# Patient Record
Sex: Male | Born: 1945 | Race: White | Hispanic: No | Marital: Married | Smoking: Never smoker
Health system: Southern US, Community
[De-identification: ages and names within clinical notes are randomized; demographics above are authoritative.]

## PROBLEM LIST (undated history)

## (undated) DIAGNOSIS — I219 Acute myocardial infarction, unspecified: Secondary | ICD-10-CM

## (undated) DIAGNOSIS — N184 Chronic kidney disease, stage 4 (severe): Secondary | ICD-10-CM

## (undated) DIAGNOSIS — I6529 Occlusion and stenosis of unspecified carotid artery: Secondary | ICD-10-CM

## (undated) DIAGNOSIS — I340 Nonrheumatic mitral (valve) insufficiency: Secondary | ICD-10-CM

## (undated) DIAGNOSIS — IMO0002 Reserved for concepts with insufficient information to code with codable children: Secondary | ICD-10-CM

## (undated) DIAGNOSIS — I2510 Atherosclerotic heart disease of native coronary artery without angina pectoris: Secondary | ICD-10-CM

## (undated) DIAGNOSIS — I472 Ventricular tachycardia: Secondary | ICD-10-CM

## (undated) DIAGNOSIS — I442 Atrioventricular block, complete: Secondary | ICD-10-CM

## (undated) DIAGNOSIS — C4490 Unspecified malignant neoplasm of skin, unspecified: Secondary | ICD-10-CM

## (undated) DIAGNOSIS — Z9289 Personal history of other medical treatment: Secondary | ICD-10-CM

## (undated) DIAGNOSIS — N189 Chronic kidney disease, unspecified: Secondary | ICD-10-CM

## (undated) DIAGNOSIS — Z950 Presence of cardiac pacemaker: Secondary | ICD-10-CM

## (undated) DIAGNOSIS — M329 Systemic lupus erythematosus, unspecified: Secondary | ICD-10-CM

## (undated) DIAGNOSIS — I10 Essential (primary) hypertension: Secondary | ICD-10-CM

## (undated) DIAGNOSIS — E785 Hyperlipidemia, unspecified: Secondary | ICD-10-CM

## (undated) DIAGNOSIS — M1990 Unspecified osteoarthritis, unspecified site: Secondary | ICD-10-CM

## (undated) DIAGNOSIS — N183 Chronic kidney disease, stage 3 (moderate): Secondary | ICD-10-CM

## (undated) DIAGNOSIS — I208 Other forms of angina pectoris: Secondary | ICD-10-CM

## (undated) DIAGNOSIS — I459 Conduction disorder, unspecified: Secondary | ICD-10-CM

## (undated) HISTORY — DX: Chronic kidney disease, unspecified: N18.9

## (undated) HISTORY — DX: Atherosclerotic heart disease of native coronary artery without angina pectoris: I25.10

## (undated) HISTORY — DX: Hyperlipidemia, unspecified: E78.5

## (undated) HISTORY — DX: Atrioventricular block, complete: I44.2

## (undated) HISTORY — DX: Nonrheumatic mitral (valve) insufficiency: I34.0

## (undated) HISTORY — DX: Occlusion and stenosis of unspecified carotid artery: I65.29

## (undated) HISTORY — PX: ACHILLES TENDON REPAIR: SUR1153

## (undated) HISTORY — DX: Personal history of other medical treatment: Z92.89

## (undated) HISTORY — PX: CARDIAC CATHETERIZATION: SHX172

## (undated) HISTORY — PX: MOHS SURGERY: SUR867

## (undated) HISTORY — DX: Essential (primary) hypertension: I10

## (undated) HISTORY — DX: Conduction disorder, unspecified: I45.9

## (undated) HISTORY — DX: Ventricular tachycardia: I47.2

## (undated) HISTORY — PX: CATARACT EXTRACTION W/ INTRAOCULAR LENS IMPLANT: SHX1309

## (undated) HISTORY — DX: Chronic kidney disease, stage 4 (severe): N18.4

## (undated) HISTORY — PX: CORONARY ARTERY BYPASS GRAFT: SHX141

---
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
## 1999-08-21 ENCOUNTER — Ambulatory Visit (HOSPITAL_COMMUNITY): Admission: RE | Admit: 1999-08-21 | Discharge: 1999-08-21 | Payer: Self-pay | Admitting: Gastroenterology

## 1999-08-21 ENCOUNTER — Encounter (INDEPENDENT_AMBULATORY_CARE_PROVIDER_SITE_OTHER): Payer: Self-pay | Admitting: Specialist

## 2000-04-28 HISTORY — PX: CAROTID ENDARTERECTOMY: SUR193

## 2000-05-15 ENCOUNTER — Emergency Department (HOSPITAL_COMMUNITY): Admission: EM | Admit: 2000-05-15 | Discharge: 2000-05-15 | Payer: Self-pay | Admitting: Emergency Medicine

## 2000-08-17 ENCOUNTER — Ambulatory Visit (HOSPITAL_COMMUNITY): Admission: RE | Admit: 2000-08-17 | Discharge: 2000-08-17 | Payer: Self-pay | Admitting: Cardiology

## 2000-12-17 ENCOUNTER — Encounter: Payer: Self-pay | Admitting: *Deleted

## 2000-12-17 ENCOUNTER — Ambulatory Visit (HOSPITAL_COMMUNITY): Admission: RE | Admit: 2000-12-17 | Discharge: 2000-12-17 | Payer: Self-pay | Admitting: *Deleted

## 2000-12-29 ENCOUNTER — Encounter: Payer: Self-pay | Admitting: *Deleted

## 2000-12-31 ENCOUNTER — Inpatient Hospital Stay (HOSPITAL_COMMUNITY): Admission: RE | Admit: 2000-12-31 | Discharge: 2001-01-01 | Payer: Self-pay | Admitting: *Deleted

## 2000-12-31 ENCOUNTER — Encounter (INDEPENDENT_AMBULATORY_CARE_PROVIDER_SITE_OTHER): Payer: Self-pay | Admitting: *Deleted

## 2001-12-14 ENCOUNTER — Encounter (INDEPENDENT_AMBULATORY_CARE_PROVIDER_SITE_OTHER): Payer: Self-pay | Admitting: *Deleted

## 2001-12-14 ENCOUNTER — Ambulatory Visit (HOSPITAL_COMMUNITY): Admission: RE | Admit: 2001-12-14 | Discharge: 2001-12-14 | Payer: Self-pay | Admitting: Gastroenterology

## 2005-04-28 HISTORY — PX: PACEMAKER INSERTION: SHX728

## 2005-12-26 ENCOUNTER — Inpatient Hospital Stay (HOSPITAL_COMMUNITY): Admission: RE | Admit: 2005-12-26 | Discharge: 2005-12-27 | Payer: Self-pay | Admitting: Cardiology

## 2007-03-12 ENCOUNTER — Encounter: Admission: RE | Admit: 2007-03-12 | Discharge: 2007-03-12 | Payer: Self-pay | Admitting: Internal Medicine

## 2007-05-14 ENCOUNTER — Ambulatory Visit: Payer: Self-pay | Admitting: Vascular Surgery

## 2008-03-01 ENCOUNTER — Inpatient Hospital Stay (HOSPITAL_BASED_OUTPATIENT_CLINIC_OR_DEPARTMENT_OTHER): Admission: RE | Admit: 2008-03-01 | Discharge: 2008-03-01 | Payer: Self-pay | Admitting: Cardiology

## 2008-03-08 ENCOUNTER — Ambulatory Visit (HOSPITAL_COMMUNITY): Admission: RE | Admit: 2008-03-08 | Discharge: 2008-03-09 | Payer: Self-pay | Admitting: Cardiology

## 2008-05-15 ENCOUNTER — Ambulatory Visit: Payer: Self-pay | Admitting: Vascular Surgery

## 2009-03-01 DIAGNOSIS — M109 Gout, unspecified: Secondary | ICD-10-CM | POA: Insufficient documentation

## 2009-03-01 DIAGNOSIS — E039 Hypothyroidism, unspecified: Secondary | ICD-10-CM | POA: Insufficient documentation

## 2009-03-01 DIAGNOSIS — M545 Low back pain, unspecified: Secondary | ICD-10-CM | POA: Insufficient documentation

## 2009-03-01 DIAGNOSIS — N529 Male erectile dysfunction, unspecified: Secondary | ICD-10-CM | POA: Insufficient documentation

## 2009-03-01 DIAGNOSIS — M1990 Unspecified osteoarthritis, unspecified site: Secondary | ICD-10-CM | POA: Insufficient documentation

## 2009-05-21 ENCOUNTER — Ambulatory Visit: Payer: Self-pay | Admitting: Ophthalmology

## 2009-05-21 ENCOUNTER — Ambulatory Visit: Payer: Self-pay | Admitting: Cardiovascular Disease

## 2009-06-15 ENCOUNTER — Ambulatory Visit: Payer: Self-pay | Admitting: Vascular Surgery

## 2009-06-27 ENCOUNTER — Ambulatory Visit: Payer: Self-pay | Admitting: Ophthalmology

## 2010-03-01 ENCOUNTER — Encounter: Payer: Self-pay | Admitting: Internal Medicine

## 2010-03-05 ENCOUNTER — Ambulatory Visit: Payer: Self-pay | Admitting: Internal Medicine

## 2010-03-09 ENCOUNTER — Encounter: Payer: Self-pay | Admitting: Internal Medicine

## 2010-03-29 ENCOUNTER — Encounter (INDEPENDENT_AMBULATORY_CARE_PROVIDER_SITE_OTHER): Payer: Self-pay | Admitting: *Deleted

## 2010-04-19 ENCOUNTER — Telehealth: Payer: Self-pay | Admitting: Internal Medicine

## 2010-04-25 ENCOUNTER — Ambulatory Visit: Payer: Self-pay | Admitting: Cardiology

## 2010-05-20 ENCOUNTER — Ambulatory Visit
Admission: RE | Admit: 2010-05-20 | Discharge: 2010-05-20 | Payer: Self-pay | Source: Home / Self Care | Attending: Internal Medicine | Admitting: Internal Medicine

## 2010-05-20 ENCOUNTER — Encounter: Payer: Self-pay | Admitting: Internal Medicine

## 2010-05-20 DIAGNOSIS — Z950 Presence of cardiac pacemaker: Secondary | ICD-10-CM | POA: Insufficient documentation

## 2010-05-20 DIAGNOSIS — Z951 Presence of aortocoronary bypass graft: Secondary | ICD-10-CM | POA: Insufficient documentation

## 2010-05-28 NOTE — Letter (Signed)
Summary: Remote Device Check  [place], [place]  [id number] [place]   [place], [place] [address]   Phone: [phone]  Fax: [phone]     March 29, 2010 MRN: [id number]   [place], [place]  [address]   Dear Mr. Hollen,   Your remote transmission was recieved and reviewed by your physician.  All diagnostics were within normal limits for you.  _____Your next transmission is scheduled for:                       .  Please transmit at any time this day.  If you have a wireless device your transmission will be sent automatically.  __X____Your next office visit is scheduled for: January 20,2012 with Dr. Caryl Comes in [place].     Sincerely,  Alma Friendly, LPN

## 2010-05-28 NOTE — Letter (Signed)
Summary: Device-Delinquent Phone Proofreader, [place]  [address] [place]   [place], [place] [address]   Phone: [phone]  Fax: [phone]     March 01, 2010 MRN: [id number]   Kyle Avila [address], [place]  [address]   Dear [name],  According to our records, you were scheduled for a device phone transmission on  02-25-2010.     We did not receive any results from this check.  If you transmitted on your scheduled day, please call us to help troubleshoot your system.  If you forgot to send your transmission, please send one upon receipt of this letter.  Thank you,   [facility]

## 2010-05-28 NOTE — Miscellaneous (Signed)
Summary: Device preload  Clinical Lists Changes  Observations: Added new observation of PPM INDICATN: CHB (03/09/2010 12:45) Added new observation of MAGNET RTE: BOL 85 ERI 65 (03/09/2010 12:45) Added new observation of PPMLEADSTAT2: active (03/09/2010 12:45) Added new observation of PPMLEADSER2: [id number]  (03/09/2010 12:45) Added new observation of PPMLEADMOD2: 4470  (03/09/2010 12:45) Added new observation of PPMLEADDOI2: 12/26/2000  (03/09/2010 12:45) Added new observation of PPMLEADLOC2: RV  (03/09/2010 12:45) Added new observation of PPMLEADSTAT1: active  (03/09/2010 12:45) Added new observation of PPMLEADSER1: [id number]  (03/09/2010 12:45) Added new observation of PPMLEADMOD1: 4469  (03/09/2010 12:45) Added new observation of PPMLEADDOI1: 12/26/2005  (03/09/2010 12:45) Added new observation of PPMLEADLOC1: RA  (03/09/2010 12:45) Added new observation of PPM DOI: 12/08/2000  (03/09/2010 12:45) Added new observation of PPM SERL#: [id number]  (03/09/2010 12:45) Added new observation of PPM MODL#: [id number]  (03/09/2010 12:45) Added new observation of PACEMAKERMFG: Medtronic  (03/09/2010 12:45) Added new observation of PPM IMP MD: Dorothyann Peng Tennant,MD  (03/09/2010 12:45) Added new observation of PPM REFER MD: Servando Salina  (03/09/2010 12:45) Added new observation of PACEMAKER MD: [name], MD  (03/09/2010 12:45)      PPM Specifications Following MD:  [name], MD     Referring MD:  Servando Salina PPM Vendor:  Medtronic     PPM Model Number:  [id number]     PPM Serial Number:  [id number] PPM DOI:  12/08/2000     PPM Implanting MD:  Servando Salina  Lead 1    Location: RA     DOI: 12/26/2005     Model #: [id number]     Serial #: [id number]     Status: active Lead 2    Location: RV     DOI: 12/26/2000     Model #: [id number]     Serial #: [id number]     Status: active  Magnet Response Rate:  BOL 85 ERI 65  Indications:  CHB

## 2010-05-28 NOTE — Cardiovascular Report (Signed)
Summary: Office Visit Remote   Office Visit Remote   Imported By: Sallee Provencal 03/29/2010 15:08:20  _____________________________________________________________________  External Attachment:    Type:   Image     Comment:   External Document

## 2010-05-30 ENCOUNTER — Other Ambulatory Visit (HOSPITAL_COMMUNITY): Payer: Self-pay

## 2010-05-30 NOTE — Assessment & Plan Note (Signed)
Summary: pc2. gd   Visit Type:  pc2  CC:  no complaints.  History of Present Illness: Kyle Avila is seein to establish pacemaker followup.  is a 65 year old gentleman with complex cardiovascular history with bypass surgery in 88 redo bypass in 96 and revascularization with stenting of the circumflex in 2009. At that time he had total LAD and RCA disease his left ventricular function then was okay.  2 years prior that he come in for a stress test was found to be in high-grade heart block. His beta blockers were discontinued a heart block persisted and he underwent dual-chamber pacing.  He has angina with heavy exertion for example last week he was skiing he had to walk up a hill after dinner in the cold. He is otherwise not significantly limited in his exercise tolerance.  He denies syncope or palpitations.    Problems Prior to Update: None  Current Medications (verified): 1)  Aspirin Ec 325 Mg Tbec (Aspirin) .... Take One Tablet By Mouth Daily 2)  Magnebind 400 400-200-1 Mg Tabs (Magnesium-Calcium-Folic Acid) .... Once Daily 3)  Imdur 60 Mg Xr24h-Tab (Isosorbide Mononitrate) .... Once Daily 4)  Nitrostat 0.4 Mg Subl (Nitroglycerin) .Marland Kitchen.. 1 Tablet Under Tongue At Onset of Chest Pain; You May Repeat Every 5 Minutes For Up To 3 Doses. 5)  Metoprolol Succinate 100 Mg Xr24h-Tab (Metoprolol Succinate) .... 1/2 Tab Two Times A Day 6)  Plavix 75 Mg Tabs (Clopidogrel Bisulfate) .... Take One Tablet By Mouth Daily 7)  Losartan Potassium-Hctz 100-12.5 Mg Tabs (Losartan Potassium-Hctz) .... Two Times A Day 8)  Anti-Hist 25 Mg Caps (Diphenhydramine Hcl) .... Once Daily 9)  Colcrys 0.6 Mg Tabs (Colchicine) .... As Needed 10)  Tramadol Hcl 50 Mg Tabs (Tramadol Hcl) .... As Needed 11)  Lipitor 80 Mg Tabs (Atorvastatin Calcium) .... Take 1/2 Tablet By Mouth Daily.  Allergies (verified): No Known Drug Allergies  Past History:  Family History: Last updated: May 26, 2010 Father: died i his 85's  with a heart attack  Social History: Last updated: 05-26-2010 Married  Tobacco Use - No.  Alcohol Use - yes - occasional  Risk Factors: Smoking Status: never (May 26, 2010)  Past Medical History: Mitral Insufficiency CAD status post bypassi  heart block requiring gout Hypertension Hyperlipidemia Chronic renal insufficiencynephrotic  Past Surgical History: CABG and redo Cardiac Catheterization Pacemaker/ dual-chamber, insertion in 2007 Corotid endarterectomy 2002  Review of Systems       full review of systems was negative apart from a history of present illness and past medical history. back stiffness and some gout  Vital Signs:  Patient profile:   65 year old male Height:      69 inches Weight:      170.25 pounds BMI:     25.23 Pulse rate:   60 / minute BP sitting:   150 / 82  (right arm) Cuff size:   regular  Vitals Entered By: Kyle Avila CMA 26-May-2010 2:44 PM)  Physical Exam  General:  Well developed, well nourished,middle 2 older Caucasian male appearing his stated age in no acute distress. Head:  normal HEENT Neck:  supple without thyromegaly or lymphadenopathy endarterectomy scar without bruits Chest Wall:  without kyphosis or scoliosis Lungs:  clear Heart:  regular rate and rhythm without murmurs or gallops Abdomen:  soft with active bowel sounds Msk:  no obvious musculoskeletal defects Extremities:  intact distal pulseswithout clubbing cyanosis or edema Neurologic:  alert and oriented and grossly normal motor and sensory function Skin:  warm and dry Psych:  normal affect   PPM Specifications Following MD:  [name], MD     Referring MD:  Servando Salina PPM Vendor:  Medtronic     PPM Model Number:  [id number]     PPM Serial Number:  [id number] PPM DOI:  12/08/2000     PPM Implanting MD:  Servando Salina  Lead 1    Location: RA     DOI: 12/26/2005     Model #: [id number]     Serial #: [id number]     Status: active Lead 2    Location: RV      DOI: 12/26/2000     Model #: [id number]     Serial #: [id number]     Status: active  Magnet Response Rate:  BOL 85 ERI 65  Indications:  CHB   Impression & Recommendations:  Problem # 1:  PACEMAKER, PERMANENT (ICD-V45.01) Device parameters and data were reviewed and no changes were made  he has evidence of hypertrophic incompetence with less than 2% of his heart beats faster than 100 beats per minute and less than 5% of his tach heart beats faster than 90  Problem # 2:  CAD, ARTERY BYPASS GRAFT (ICD-414.04) he is significant coronary disease. He has some angina with heavy exertion I suspect that this is resulted in the compromise of his heart rate as noted above limiting cardiac output by limiting his heart rate. He is able to do functionally what he wants I suspect that this is the best choice His updated medication list for this problem includes:    Aspirin Ec 325 Mg Tbec (Aspirin) .Marland Kitchen... Take one tablet by mouth daily    Imdur 60 Mg Xr24h-tab (Isosorbide mononitrate) ..... Once daily    Nitrostat 0.4 Mg Subl (Nitroglycerin) .Marland Kitchen... 1 tablet under tongue at onset of chest pain; you may repeat every 5 minutes for up to 3 doses.    Metoprolol Succinate 100 Mg Xr24h-tab (Metoprolol succinate) .Marland Kitchen... 1/2 tab two times a day    Plavix 75 Mg Tabs (Clopidogrel bisulfate) .Marland Kitchen... 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 Will need to assess his left ventricular function to see how important this is from a prognostic point of view His updated medication list for this problem includes:    Aspirin Ec 325 Mg Tbec (Aspirin) .Marland Kitchen... Take one tablet by mouth daily    Imdur 60 Mg Xr24h-tab (Isosorbide mononitrate) ..... Once daily    Nitrostat 0.4 Mg Subl (Nitroglycerin) .Marland Kitchen... 1 tablet under tongue at onset of chest pain; you may repeat every 5 minutes for up to 3 doses.    Metoprolol Succinate  100 Mg Xr24h-tab (Metoprolol succinate) .Marland Kitchen... 1/2 tab two times a day    Plavix 75 Mg Tabs (Clopidogrel bisulfate) .Marland Kitchen... 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 Take one tablet by mouth daily    Imdur 60 Mg Xr24h-tab (Isosorbide mononitrate) ..... Once daily    Nitrostat 0.4 Mg Subl (Nitroglycerin) .Marland Kitchen... 1 tablet under tongue at onset of chest pain; you may repeat every 5 minutes for up to 3 doses.    Metoprolol Succinate 100 Mg Xr24h-tab (Metoprolol succinate) .Marland Kitchen... 1/2 tab two times a day    Plavix 75 Mg Tabs (Clopidogrel bisulfate) .Marland Kitchen... 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 Take one tablet by mouth daily    Imdur 60 Mg Xr24h-tab (Isosorbide mononitrate) ..... Once daily    Nitrostat 0.4 Mg Subl (Nitroglycerin) .Marland Kitchen... 1 tablet under tongue at onset of chest pain; you may repeat every 5 minutes for up to 3 doses.    Metoprolol Succinate 100 Mg Xr24h-tab (Metoprolol succinate) .Marland Kitchen... 1/2 tab two times a day    Plavix 75 Mg Tabs (Clopidogrel bisulfate) .Marland Kitchen... Take one tablet by mouth daily   Patient Instructions: 1)  Your physician recommends that you continue on your current medications as directed. Please refer to the Current Medication list given to you today. 2)  Your physician wants you to follow-up in: 1 year.  You will receive a reminder letter in the mail two months in advance. If you don't receive a letter, please call our office to schedule the follow-up appointment.

## 2010-05-30 NOTE — Progress Notes (Addendum)
Summary: pt cancelled appt  Phone Note Call from Patient   Caller: Patient Reason for Call: Talk to Nurse Summary of Call: pt called and was very upset that an appt was made at our office and he was not notified first and he got a bill and he wanted someone to explain that bill to him and he was not interested in coming here. Initial call taken by: Shelda Pal,  April 19, 2010 10:40 AM  Follow-up for Phone Call        Left message for patient to please call us so we can document who is following his device. Follow-up by: Alma Friendly, LPN,  December 23, [id number] 3:39 PM  Additional Follow-up for Phone Call Additional follow up Details #1::        spoke w/wife--pt not at home.  will let pt know of call and have him call in. Shelly Bombard  April 23, 2010 12:07 PM  pt never returned call. Shelly Bombard  May 07, 2010 10:02 AM

## 2010-05-30 NOTE — Procedures (Signed)
Summary: Cardiology Device Clinic   Allergies: No Known Drug Allergies  PPM Specifications Following MD:  [name], MD     Referring MD:  Servando Salina PPM Vendor:  Medtronic     PPM Model Number:  [id number]     PPM Serial Number:  [id number] PPM DOI:  12/08/2000     PPM Implanting MD:  Servando Salina  Lead 1    Location: RA     DOI: 12/26/2005     Model #: [id number]     Serial #: [id number]     Status: active Lead 2    Location: RV     DOI: 12/26/2000     Model #: [id number]     Serial #: [id number]     Status: active  Magnet Response Rate:  BOL 85 ERI 65  Indications:  CHB   PPM Follow Up Remote Check?  No Battery Voltage:  2.77 V     Battery Est. Longevity:  4.5 years     Pacer Dependent:  Yes       PPM Device Measurements Atrium  Amplitude: 2.8 mV, Impedance: 526 ohms, Threshold: 0.625 V at 0.4 msec Right Ventricle  Amplitude: 15.68 mV, Impedance: 464 ohms, Threshold: 0.625 V at 0.4 msec  Episodes MS Episodes:  0     Percent Mode Switch:  0     Coumadin:  No Ventricular High Rate:  2     Atrial Pacing:  59%     Ventricular Pacing:  100%  Parameters Mode:  DDD     Lower Rate Limit:  60     Upper Rate Limit:  130 Paced AV Delay:  150     Sensed AV Delay:  120 Next Remote Date:  08/22/2010     Next Cardiology Appt Due:  04/29/2011 Tech Comments:  No parameter changes. Device function normal.  2VHR episodes.  [facility] transmissions every 3 months.  ROV 1 year with Dr. Caryl Comes. Alma Friendly, LPN  January 23, [id number] 4:05 PM

## 2010-06-05 ENCOUNTER — Other Ambulatory Visit: Payer: Self-pay

## 2010-06-05 ENCOUNTER — Other Ambulatory Visit (HOSPITAL_COMMUNITY): Payer: Self-pay

## 2010-06-05 NOTE — Cardiovascular Report (Signed)
Summary: Office Visit   Office Visit   Imported By: Sallee Provencal 05/29/2010 14:04:03  _____________________________________________________________________  External Attachment:    Type:   Image     Comment:   External Document

## 2010-06-11 ENCOUNTER — Other Ambulatory Visit (HOSPITAL_COMMUNITY): Payer: Self-pay

## 2010-06-21 ENCOUNTER — Other Ambulatory Visit: Payer: Self-pay

## 2010-06-21 ENCOUNTER — Other Ambulatory Visit (INDEPENDENT_AMBULATORY_CARE_PROVIDER_SITE_OTHER): Payer: 59

## 2010-06-21 DIAGNOSIS — Z48812 Encounter for surgical aftercare following surgery on the circulatory system: Secondary | ICD-10-CM

## 2010-06-21 DIAGNOSIS — I6529 Occlusion and stenosis of unspecified carotid artery: Secondary | ICD-10-CM

## 2010-06-24 ENCOUNTER — Ambulatory Visit (HOSPITAL_COMMUNITY): Payer: Medicare Other | Attending: Internal Medicine

## 2010-06-24 DIAGNOSIS — I079 Rheumatic tricuspid valve disease, unspecified: Secondary | ICD-10-CM | POA: Insufficient documentation

## 2010-06-24 DIAGNOSIS — I442 Atrioventricular block, complete: Secondary | ICD-10-CM

## 2010-06-24 DIAGNOSIS — I059 Rheumatic mitral valve disease, unspecified: Secondary | ICD-10-CM | POA: Insufficient documentation

## 2010-06-24 DIAGNOSIS — I379 Nonrheumatic pulmonary valve disorder, unspecified: Secondary | ICD-10-CM | POA: Insufficient documentation

## 2010-07-01 ENCOUNTER — Ambulatory Visit (INDEPENDENT_AMBULATORY_CARE_PROVIDER_SITE_OTHER): Payer: 59 | Admitting: Cardiology

## 2010-07-01 DIAGNOSIS — I119 Hypertensive heart disease without heart failure: Secondary | ICD-10-CM

## 2010-07-01 DIAGNOSIS — I2510 Atherosclerotic heart disease of native coronary artery without angina pectoris: Secondary | ICD-10-CM

## 2010-07-03 NOTE — Procedures (Unsigned)
CAROTID DUPLEX EXAM  INDICATION:  Followup carotid artery disease.  HISTORY: Diabetes:  No. Cardiac:  MI and pacemaker. Hypertension:  Yes. Smoking:  No. Previous Surgery:  Right carotid endarterectomy 2002. CV History:  The patient is currently asymptomatic. Amaurosis Fugax No, Paresthesias No, Hemiparesis No                                      RIGHT             LEFT Brachial systolic pressure:         116               118 Brachial Doppler waveforms:         WNL               WNL Vertebral direction of flow:        Atypical antegrade                  Antegrade DUPLEX VELOCITIES (cm/sec) CCA peak systolic                   97                123456 ECA peak systolic                   176               95 ICA peak systolic                   76                70 ICA end diastolic                   14                22 PLAQUE MORPHOLOGY:                  Heterogeneous     Heterogeneous PLAQUE AMOUNT:                      Mild              Mild to moderate PLAQUE LOCATION:                    CCA               ICA, CCA, ECA  IMPRESSION:  Patent and durable right carotid endarterectomy. Bilaterally no hemodynamically significant stenosis within internal carotid arteries.  Right external carotid artery stenosis.  Atypical right vertebral decrease enddiastolic velocities suggestive of stenosis. Left vertebral is prominent.    ___________________________________________ Rosetta Posner, M.D.  OD/MEDQ  D:  06/21/2010  T:  06/21/2010  Job:  [id number]

## 2010-08-22 ENCOUNTER — Other Ambulatory Visit: Payer: Self-pay

## 2010-08-22 ENCOUNTER — Ambulatory Visit (INDEPENDENT_AMBULATORY_CARE_PROVIDER_SITE_OTHER): Payer: 59 | Admitting: *Deleted

## 2010-08-22 DIAGNOSIS — I441 Atrioventricular block, second degree: Secondary | ICD-10-CM

## 2010-08-22 DIAGNOSIS — Z950 Presence of cardiac pacemaker: Secondary | ICD-10-CM

## 2010-09-01 NOTE — Progress Notes (Signed)
Pacer remote check  

## 2010-09-04 ENCOUNTER — Encounter: Payer: Self-pay | Admitting: *Deleted

## 2010-09-10 NOTE — Procedures (Signed)
CAROTID DUPLEX EXAM   INDICATION:  Follow-up right carotid endarterectomy.   HISTORY:  Diabetes:  No.  Cardiac:  MI, pacemaker.  Hypertension:  Yes.  Smoking:  No.  Previous Surgery:  Right carotid endarterectomy on 12/31/2000.  CV History:  Currently asymptomatic.  Amaurosis Fugax No, Paresthesias No, Hemiparesis No.                                       RIGHT             LEFT  Brachial systolic pressure:         122               120  Brachial Doppler waveforms:         Normal            Normal  Vertebral direction of flow:        Antegrade         Antegrade  DUPLEX VELOCITIES (cm/sec)  CCA peak systolic                   82                123456  ECA peak systolic                   38                73  ICA peak systolic                   44                58  ICA end diastolic                   15                19  PLAQUE MORPHOLOGY:                                    Heterogeneous  PLAQUE AMOUNT:                      None              Mild  PLAQUE LOCATION:                                      ICA/ECA/CCA   IMPRESSION:  1. Patent right carotid endarterectomy site with no right internal      carotid artery stenosis noted.  2. A 1-39% stenosis of the left internal carotid artery.  3. No significant change noted when compared to the previous exam on      05/14/2007.   ___________________________________________  P. Drucie Opitz, M.D.   CH/MEDQ  D:  05/15/2008  T:  05/15/2008  Job:  [id number]

## 2010-09-10 NOTE — H&P (Signed)
NAMEMANASE, DREIS NO.:  [id number]   MEDICAL RECORD NO.:  [id number]          PATIENT TYPE:   LOCATION:                                 FACILITY:   PHYSICIAN:  [name], M.D.DATE OF BIRTH:  23-Jan-1946   DATE OF ADMISSION:  03/01/2008  DATE OF DISCHARGE:                              HISTORY & PHYSICAL   CHIEF COMPLAINT:  Chest pain.   HISTORY OF PRESENT ILLNESS:  Mr. Chaffey is a very pleasant 65 year old  white male who has a known history of ischemic heart disease.  He  presents now for diagnostic cardiac catheterization.  He was seen for  his 39-month office appointment on February 28, 2008.  At that time, he  noted that he was using more and more in the way of nitroglycerin than  he previously had.  Most of his symptoms have been with exertion.  He  has really not had any symptoms at rest.  He has a known history of  extensive ischemic heart disease and is subsequently now referred for  diagnostic cardiac catheterization.   PAST MEDICAL HISTORY:  1. Atherosclerotic cardiovascular disease.  He had unstable angina and      angioplasty in 1988 which led to acute closure that led to original      coronary artery bypass grafting x2 with left internal mammary to      the LAD and a saphenous vein graft to the right coronary artery in      1988.  He had a posterior myocardial infarction suffered in March      1992 and at that time had angioplasty to an obtuse marginal branch.      He subsequently underwent redo coronary artery bypass grafting in      August 1996 with saphenous vein graft to posterior descending and      saphenous vein graft to the posterolateral branches.  His last      catheterization occurred in April 2002, which showed well-preserved      LV function, totally occluded right coronary, totally occluded LAD      with severe segmental disease in the left circumflex.  The vein      graft to the right coronary was patent.  The left  internal mammary      to the LAD was patent.  There were collateral flows to the left      circumflex.  There was an occluded saphenous vein graft to left      circumflex but with collateral flow coming in from the other      vessels and grafted segments.  He has been managed medically since      that time.  2. AV block with subsequent implantation of a Medtronic dual-chamber      Medtronic VERSA in August 2007.  3. Nephrotic syndrome:  He is followed by [facility].  It has      been present since 1992.  4. Hyperlipidemia.  5. History of impotency.  6. History of a TIA with probable embolus following cardiac      catheterization in 1996  with complete resolution.  7. Hypertension.   Allergies are none.   Current medicines include:  1. Aspirin daily.  2. Folic acid 1 mg a day.  3. Imdur 60 mg a day.  4. Diovan 160 b.i.d.  5. Caduet 5/80 daily.  6. Nitroglycerin p.r.n.  7. Toprol 50 b.i.d.  8. Colchicine p.r.n.   Family history is positive for coronary disease.  Father died of a heart  attack in his early 62s.   SOCIAL HISTORY:  He has no tobacco use, no excessive alcohol use.  He is  married.  He lives with his wife.   Review of systems is as noted above and is otherwise unremarkable.   PHYSICAL EXAMINATION:  GENERAL:  He is middle-aged white male who is in  no acute distress.  He is currently pain free.  VITAL SIGNS:  Weight 166.5 pounds, blood pressure is 118/70, heart rate  is 62 and regular, and respirations 80.  He is afebrile.  SKIN:  Warm and dry.  Color is unremarkable.  LUNGS:  Clear.  CARDIOVASCULAR:  Regular rhythm.  There is no murmur, rub, or gallop.  ABDOMEN:  Soft, positive bowel sounds, nontender.  EXTREMITIES:  Without edema.  NEUROLOGIC:  No gross focal deficits.   Pertinent labs are pending.   OVERALL IMPRESSION:  1. Progressive angina requiring more in the way of sublingual      nitroglycerin.  2. Known extensive history of atherosclerotic  cardiovascular disease      with previous bypass surgery in 1988 and subsequent redo surgery in      1996.  3. Nephrotic syndrome.  4. Hypertension.  5. Hyperlipidemia.  6. Functioning dual-chamber pacemaker.   PLAN:  We will proceed on with cardiac catheterization.  The risks,  procedure, and benefits have all been reviewed, and he is willing to  proceed on Wednesday, March 01, 2008.  The patient will be treated  with Mucomyst prophylactically, as well as IV hydration.      Doyle Askew, N.P.      [name], M.D.  Electronically Signed    LC/MEDQ  D:  02/28/2008  T:  02/29/2008  Job:  [id number]   cc:   Windy Kalata, M.D.

## 2010-09-10 NOTE — Cardiovascular Report (Signed)
NAMESUJAN, Kyle Avila NO.:  [id number]   MEDICAL RECORD NO.:  [id number]          PATIENT TYPE:  OIB   LOCATION:  1966                         FACILITY:  [facility]   PHYSICIAN:  [name], M.D.DATE OF BIRTH:  Jun 03, 1945   DATE OF PROCEDURE:  03/01/2008  DATE OF DISCHARGE:  03/01/2008                            CARDIAC CATHETERIZATION   PROCEDURE:  Left heart catheterization with selective coronary  angiography, left ventricular angiography, saphenous vein graft  angiography x2, and angiography of the left internal mammary artery.   TYPE AND SITE OF ENTRY:  Percutaneous right femoral artery.   CATHETERS:  A 4-French pigtail ventriculographic catheter, 4-French  Judkins left coronary catheter, 4-French 3DRC catheter.   CONTRAST MATERIAL:  Omnipaque approximately 100 mL.   COMMENT:  The patient tolerated the procedure well.   HEMODYNAMIC DATA:  The aortic pressure was 143/79, LV pressure was  138/17.  There was no aortic valve gradient noted on pullback.   ANGIOGRAPHIC DATA:  The left ventricular angiogram was performed in the  RAO position.  There was 70-degree of dyssynergy because of paced rhythm  and mild posterior hypokinesia.  Global ejection fraction estimated to  be 45-50%.   CORONARY ARTERIES:  Coronary arteries arise and distribute normally.  1. Right coronary artery is totally occluded proximally.  2. Left anterior descending is totally occluded in its ostium.  3. Left circumflex, the left circumflex has a severe segmental      stenosis in its proximal 3 cm.  There is one area of severe focal      stenosis.  This is approximately a 2.5-mm vessel and it continues      to the posterior lateral wall.  It receives collaterals from the      distal right coronary artery.  It would be suitable for bypass      graft #3.  4. Left main coronary artery is short and essentially normal.  5. Saphenous vein graft to the posterior lateral branch, it is  widely      patent with a nice insertion and good distal runoff.  It supplies      collaterals to the distal posterior lateral branch of the left      circumflex.  Appears to have satisfactory collaterals.  Right      coronary artery bed is not particularly large.  However, by spine      collaterals to the distal left circumflex, there is relatively      significant amount of myocardium at risk, although this is part of      the area that was involved in myocardial infarction.   The left internal mammary artery is an excellent vessel/excellent flow  in the left anterior descending and distal diagonal vessels.  There is  collateral flow to the distal right coronary artery as well as  collateral flow to the high obtuse marginal vessels off of the left  circumflex.  These collaterals were felt to be excellent.   OVERALL IMPRESSION:  1. Mild left ventricular dysfunction.  2. Totally occluded right coronary artery, totally occluded left  anterior descending and severe stenosis in the proximal circumflex      with a totally occluded large obtuse marginal.  3. Patent saphenous vein graft to the distal right coronary arteries      with collaterals to the distal left circumflex and a patent left      internal mammary artery to the left anterior descending that      provides collaterals to the distal right coronary artery as well as      obtuse marginal of the circumflex.   DISCUSSION:  Overall, Kyle Avila would be at moderate risk.  The proximal  left circumflex would be suitable for angioplasty and stent placement  with some degree of improvement of his chronic stable angina.  He does  have satisfactory collaterals at this point in time.      [name], M.D.  Electronically Signed     SNT/MEDQ  D:  03/01/2008  T:  03/02/2008  Job:  [id number]

## 2010-09-10 NOTE — H&P (Signed)
NAMEHANISH, Avila NO.:  [id number]   MEDICAL RECORD NO.:  [id number]          PATIENT TYPE:  OIB   LOCATION:                               FACILITY:  [facility]   PHYSICIAN:  [name], M.D.DATE OF BIRTH:  05/15/1945   DATE OF ADMISSION:  03/08/2008  DATE OF DISCHARGE:                              HISTORY & PHYSICAL   CHIEF COMPLAINT:  Chest pain.   HISTORY OF PRESENT ILLNESS:  Mr. Kyle Avila presents for attempts at  percutaneous coronary intervention.  He has had progressive angina here  over the last several months that have required more sublingual  nitroglycerin use.  He was referred for cardiac catheterization which  was performed on March 01, 2008.  He had mild LV dysfunction with a  totally occluded right coronary, totally occluded LAD, and severe  stenosis in the proximal circumflex with a totally occluded large obtuse  marginal branch.  The vein graft to the distal right was patent with  collaterals to the distal left circumflex, there was a patent left  internal mammary to the LAD that provides collaterals to the distal  right coronary as well as the obtuse marginal.  Overall, he is felt to  be at moderate risk and he now presents for angioplasty and stenting of  the left circumflex.   PAST MEDICAL HISTORY:  1. Known atherosclerotic cardiovascular disease with remote      angioplasty in 1988 which led to acute closure and subsequent      emergent bypass grafting x2 with left internal mammary to the LAD      and a vein graft to the right coronary.  He had a posterior MI      suffered in March 1992, at that time had angioplasty to the obtuse      marginal.  He subsequently underwent redo surgery in August 1996      with a vein graft to the posterior descending and a vein graft to      the posterolateral branches.  His last catheterization is as      described above.  2. 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  3. Nephrotic syndrome with chronic renal insufficiency followed by Dr.      Fleet Contras.  4. Hyperlipidemia.  5. History of impotency.  6. History of a TIA with probable embolus following cardiac      catheterization in 1996 with complete resolution.  7. Hypertension.   ALLERGIES:  None.   CURRENT MEDICATIONS:  1. Plavix 75 mg a day started on March 06, 2008.  2. Aspirin daily.  3. Folic acid 1 mg a day.  4. Imdur 60 mg a day.  5. Diovan 160 b.i.d.  6. Caduet 5/80 daily.  7. Nitroglycerin p.r.n.  8. Toprol 50 b.i.d.  9. Colchicine p.r.n.   FAMILY HISTORY:  Unchanged.   SOCIAL HISTORY:  Unchanged.   REVIEW OF SYSTEMS:  As noted above and is otherwise unremarkable.   PHYSICAL EXAMINATION:  GENERAL:  He is currently in no acute distress.  He is pain free.  VITAL SIGNS:  Blood pressure 118/70, heart rate 62 and regular, and  respirations 18.  His weight 167 pounds.  He is afebrile.  SKIN:  Warm and dry.  Color is unremarkable.  LUNGS:  Clear.  HEART:  Regular rhythm.  ABDOMEN:  Soft, positive bowel sounds, nontender.  EXTREMITIES:  Without edema.  NEUROLOGIC:  No gross focal deficits.   Repeat labs are pending.   OVERALL IMPRESSION:  1. Recent cardiac catheterization showing a severe stenosis in the      left circumflex.  2. Progressive angina.  3. Extensive history of atherosclerotic cardiovascular disease with      previous bypass surgery in 1988 and redo surgery in 1996.  4. Nephrotic syndrome with chronic renal insufficiency.  5. Hypertension.  6. Hyperlipidemia.  7. Functioning dual-chamber pacemaker.   PLAN:  We will proceed on with attempts at PCI.  The patient has been  placed on Plavix.  The procedure has been reviewed in full detail and he  is willing to proceed on Wednesday, March 08, 2008.      Doyle Askew, N.P.      [name], M.D.  Electronically Signed    LC/MEDQ  D:  03/06/2008  T:   03/07/2008  Job:  [id number]

## 2010-09-10 NOTE — Discharge Summary (Signed)
NAME:  Kyle Avila, Kyle Avila NO.:  [id number]   MEDICAL RECORD NO.:  [id number]          PATIENT TYPE:  INP   LOCATION:  6531                         FACILITY:  [facility]   PHYSICIAN:  [name], M.D.DATE OF BIRTH:  [birth year]   DATE OF ADMISSION:  03/08/2008  DATE OF DISCHARGE:  03/09/2008                               DISCHARGE SUMMARY   DISCHARGE DIAGNOSES:  1. Unstable angina with elective PCI to the left circumflex.  2. Nonischemic heart disease with recent cardiac catheterization on      March 01, 2008, showing mild left ventricular dysfunction with      totally occluded right coronary, totally occluded left anterior      descending and severe stenosis in the proximal circumflex with      totally occluded large obtuse marginal branch.  The vein graft to      the distal right is patent with collaterals to the distal left      circumflex.  There is a patent left internal mammary to the left      anterior descending that provides collaterals to the distal right      coronary as well as the obtuse marginal.  3. History of original bypass surgery in 1988, subsequent redo surgery      in August 1996.  4. History of atrioventricular block with implantation of a Medtronic      dual chamber pacemaker in August 2007.  5. Nephrotic syndrome with chronic renal insufficiency.  6. Hyperlipidemia.  7. History of impotency.  8. Remote transient ischemic attack following catheterization in 1996.  9. Hypertension, currently well controlled.   HISTORY OF PRESENT ILLNESS:  The patient is a very pleasant 65 year old  white male who has had longstanding history of ischemic heart disease.  He has had recent catheterization with those results as described above.  He has been having more in the way of chest pain and requiring more  sublingual nitroglycerin.  He subsequently presents for PCI attempts to  the left circumflex.   Please see the history and physical for further  patient presentation and  profile.   PT and PTT were unremarkable.  CBC was normal.  Chemistry showed a BUN  of 32, creatinine of 1.5, potassium was 5.4.   HOSPITAL COURSE:  The patient was brought in for hydration.  He has been  premedicated with Mucomyst as well.  Attempts for percutaneous coronary  intervention to the left circumflex was carried out.  He subsequently  had 2 Promus stents both 2.5 mm x 18 mm placed in the distal and  proximal portion of the left circumflex and overall satisfactory result  was obtained.  Postprocedure, he was transferred to 60/100 and today on  March 09, 2008, he is doing well.   PHYSICAL EXAMINATION:  His overall physical exam is unremarkable.   His current lab at discharge is now pending, but overall he was felt to  be a satisfactory candidate for discharge today.   DISCHARGE CONDITION:  Stable.   DISCHARGE DIET:  Low-salt heart healthy.   WOUND CARE:  He is to  use an ice pack as needed to the groin.   DISCHARGE MEDICATIONS:  1. Plavix 75 mg a day.  2. Aspirin 325 mg a day.  3. Folic acid 1 mg a day.  4. Imdur 60 mg a day.  5. Diovan 160 b.i.d.  6. Caduet 5/80 daily.  7. Toprol 50 mg b.i.d.   We will continue with p.r.n. colchicine and sublingual nitroglycerin.   He will plan on seeing me back in the office in 1 week, but we will have  his labs checked on Monday, March 13, 2008, for followup kidney  function.  He is to call our office if any problems arise in the  interim.      Doyle Askew, N.P.      [name], M.D.  Electronically Signed    LC/MEDQ  D:  03/09/2008  T:  03/09/2008  Job:  [id number]

## 2010-09-10 NOTE — Cardiovascular Report (Signed)
Kyle Avila, Kyle Avila NO.:  [id number]   MEDICAL RECORD NO.:  [id number]          PATIENT TYPE:  INP   LOCATION:  6531                         FACILITY:  [facility]   PHYSICIAN:  [name], M.D.DATE OF BIRTH:  08-18-1945   DATE OF PROCEDURE:  03/08/2008  DATE OF DISCHARGE:                            CARDIAC CATHETERIZATION   PROCEDURE:  Angioplasty with stent placement of the proximal left  circumflex coronary.   DETAILS OF PROCEDURE:  The right femoral area was infiltrated with  lidocaine.  Using 6-French 3.5 Voca guide provided satisfactory backup.  Initially, placed a Prowater guidewire distally.  We were able to pass a  2.5 x 20-mm Quantum Maverick balloon and predilated to a maximum of 18  atmospheres.  We then returned with a 2.5 x 18-mm Promus stent, but we  were initially unable to cross and then removed and placed an additional  Prowater wire and then we were able to pass the 2.5 x 18-mm Promus stent  distally.  It was inflated to maximum of 18 atmospheres.  We then  returned with a 2.5 x 18-mm Promus stent proximally.  It was inflated to  maximum of 17 atmospheres.  We then were able to post dilate the  proximal and mid section of the stented area with a 2.5 x 20-mm Quantum  Maverick to 20 atmospheres.  The stents overlap proximally 3 mm in the  midportion.  The final angiographic result showed 95% focal stenosis as  well as segmental length of the stenosis was adequately dilated with no  residual stenosis.  The overall results felt to be excellent.   OVERALL IMPRESSION:  1. Successful stent placement in the proximal left circumflex with 2      sequential 18 x 2.5-mm Promus (drug-eluting) stents.       [name], M.D.  Electronically Signed     SNT/MEDQ  D:  03/08/2008  T:  03/09/2008  Job:  [id number]

## 2010-09-10 NOTE — Assessment & Plan Note (Signed)
OFFICE VISIT   Kyle Avila, Kyle Avila  DOB:  [birth year]                                       06/15/2009  [id number]   Patient presents today for continued evaluation of extracranial  cerebrovascular occlusive disease.  He is status post right carotid  endarterectomy by Dr. Drucie Opitz in 2002.  He has had no neurologic  events since that time, specifically amaurosis fugax, transient ischemic  attack, or stroke.  He does continue to have ongoing issues regarding  the coronary disease and has had a pacemaker placement.  He does have a  history of hypertension and prior myocardial infarction.  Does not have  any history of congestive heart failure.   SOCIAL HISTORY:  He is married with 2 children.  He is retired.  Does  not smoke cigarettes.  Does have 1 glass of alcohol per day.   REVIEW OF SYSTEMS:  No weight loss or weight gain.  His weight is  reported at 165 pounds.  He is 5 foot 8 inches tall.  CARDIAC:  Significant for chest pain with exercise.  PULMONARY:  Negative.  GI:  Negative for kidney disease.  He does have some renal  insufficiency.  NEUROLOGIC:  No dizziness or blackouts.  MUSCULOSKELETAL:  Arthritic joint disease.  Does have some change in  eyesight.  No hematologic issues.   PHYSICAL EXAMINATION:  Blood pressure 134/69, pulse 60, respirations 18.  In general, he is well-nourished in no acute distress.  HEENT is normal.  Chest is clear bilaterally.  His carotid arteries are without bruits  bilaterally.  He has a well-healed right neck incision.  Neuro shows no  focal weakness or paresthesias.  Skin is without ulceration or rashes.  Musculoskeletal:  No major deformities or cyanosis.   I did his most recent duplex with the patient.  This shows widely patent  endarterectomy on the right.  No significant stenosis on the left.   I have recommend that we continue with his yearly ultrasound follow-up.  I explained there is very limited  accuracy with physical exam of the  carotid versus carotid duplex.  He will see [place] in our vascular lab and  see a physician should he develop new difficulty.     Rosetta Posner, M.D.  Electronically Signed   TFE/MEDQ  D:  06/15/2009  T:  06/18/2009  Job:  [id number]   cc:   [name], M.D.  Berneta Sages, M.D.

## 2010-09-10 NOTE — Procedures (Signed)
CAROTID DUPLEX EXAM   INDICATION:  Followup known coronary artery disease.   HISTORY:  Diabetes:  No.  Cardiac:  MI, pacemaker.  Hypertension:  Yes.  Smoking:  No.  Previous Surgery:  Right carotid endarterectomy.  CV History:  Amaurosis Fugax No, Paresthesias No, Hemiparesis No                                       RIGHT             LEFT  Brachial systolic pressure:         19                100  Brachial Doppler waveforms:         Biphasic          Biphasic  Vertebral direction of flow:        Antegrade         Antegrade  DUPLEX VELOCITIES (cm/sec)  CCA peak systolic                   92                0000000  ECA peak systolic                   63                78  ICA peak systolic                   44                69  ICA end diastolic                   13                27  PLAQUE MORPHOLOGY:                  None              Heterogenous  PLAQUE AMOUNT:                      None              Mild  PLAQUE LOCATION:                    None              ICA, ECA   IMPRESSION:  1. 1-39% stenosis noted in the left internal carotid artery stenosis.  2. Normal carotid duplex noted in the right internal carotid artery.  3. Status post right carotid endarterectomy.  4. Antegrade bilateral vertebral arteries.   ___________________________________________  Rosetta Posner, M.D.   MG/MEDQ  D:  05/14/2007  T:  05/14/2007  Job:  [id number]

## 2010-09-10 NOTE — Procedures (Signed)
CAROTID DUPLEX EXAM   INDICATION:  Carotid disease.   HISTORY:  Diabetes:  No.  Cardiac:  MI, pacemaker.  Hypertension:  Yes.  Smoking:  No.  Previous Surgery:  Right carotid endarterectomy in 2002.  CV History:  Asymptomatic.  Amaurosis Fugax No, Paresthesias No, Hemiparesis No                                       RIGHT             LEFT  Brachial systolic pressure:         114               112  Brachial Doppler waveforms:         Normal            Normal  Vertebral direction of flow:        Antegrade         Antegrade  DUPLEX VELOCITIES (cm/sec)  CCA peak systolic                   106               [id number]  ECA peak systolic                   54                90  ICA peak systolic                   52                83  ICA end diastolic                   16                31  PLAQUE MORPHOLOGY:                  Mixed             Mixed  PLAQUE AMOUNT:                      Mild              Mild  PLAQUE LOCATION:                    CCA               ICA / ECA / CCA   IMPRESSION:  1. Patent right carotid endarterectomy site with no right internal      carotid artery stenosis.  2. 1%-39% stenosis of the left internal carotid artery.  3. No significant change noted when compared to the previous exam on      05/15/2008.   ___________________________________________  Rosetta Posner, M.D.   CH/MEDQ  D:  06/15/2009  T:  06/16/2009  Job:  [id number]

## 2010-09-13 NOTE — Discharge Summary (Signed)
NAMENASIIR, SCALA NO.:  [id number]   MEDICAL RECORD NO.:  [id number]          PATIENT TYPE:  OIB   LOCATION:  6532                         FACILITY:  [facility]   PHYSICIAN:  [name], M.D.DATE OF BIRTH:  03/25/1946   DATE OF ADMISSION:  12/26/2005  DATE OF DISCHARGE:                                 DISCHARGE SUMMARY   PRIMARY DISCHARGE DIAGNOSIS:  AV block with subsequent implantation of a  Medtronic dual chamber bipolar Medtronic model VEDRO1 Versa serial  [id number].   SECONDARY DISCHARGE DIAGNOSES:  1. Atherosclerotic cardiovascular disease and remote history of      angioplasty in 1988.  Subsequent coronary artery bypass grafting x2 in      1988.  He had posterior MI in March of 1992 and he had redo surgery in      August of 1996.  His last catheterization was in April of 2002.  2. Chronic nephrotic syndrome.  3. Hyperlipidemia.  4. Hypertension.  5. History of impotency.  6. Prior history of TIA.   HISTORY OF PRESENT ILLNESS:  Mr. Conly is a 65 year old male.  He has  multiple medical problems.  He presented for a routine adenosine Cardiolite  study.  At that time he was noted to have significant AV block.  His  Lopressor dose was discontinued however the EKG basically remained  unchanged.  He was subsequently referred for pacemaker implantation.   Please see dictated history and physical for further patient presentation  and profile.   LABORATORY DATA ON ADMISSION:  PT and PTT were unremarkable.  Chemistries  showed a BUN of 28, creatinine was 1.6.  His potassium was 4.8.  CBC was  normal.   HOSPITAL COURSE:  The patient was admitted electively.  His Lopressor had  been totally discontinued.  A Versa Medtronic dual chamber pacemaker was  implanted on December 26, 2005 with serial [id number].  That procedure was  tolerated well without any known complications.  Post procedure he was  transferred to 6500 and plans were made for him to be  discharged in the  morning if deemed stable on a.m. rounds.   DISCHARGE CONDITION:  Stable.   DISCHARGE DIET:  Heart healthy.   ACTIVITY:  His activity is to be increased slightly and as tolerated.   Extensive written instructions are given regarding pacemaker care,  specifically not to raise the right arm above his head for the next 2 weeks  as well as to avoid getting the wound wet for the first week as well.  Betadine swabs were made available for him to paint the site twice a day for  the next 3 days.   DISCHARGE MEDICATIONS:  1. Aspirin daily.  2. Folic acid daily.  3. Imdur 60 a day.  4. Diovan 160 b.i.d.  5. Caduet 5-20 daily.  6. Metoprolol 100 mg in the morning, 50 mg in the evening which is now      restarted.  He may use Tylenol for discomfort.   Will plan on seeing him back in the office next Friday, January 02, 2006 at  10:00  a.m.  Certainly sooner if any problems arise in the interim.      Doyle Askew, N.P.      [name], M.D.  Electronically Signed    LC/MEDQ  D:  12/26/2005  T:  12/26/2005  Job:  [id number]   cc:   Windy Kalata, M.D.

## 2010-09-13 NOTE — H&P (Signed)
[place]  Patient:    Kyle Avila, Kyle Avila                      MRN: [id number] Adm. Date:  [id number] Disc. Date: [id number] Attending:  Willy Eddy Dictator:   Marlane Hatcher. Maxwell Caul, R.N., A.N.P. CCEinar Grad R. Dagmar Hait, M.D.   History and Physical  CHIEF COMPLAINT:  Abnormal exercise tolerance test.  HISTORY OF PRESENT ILLNESS:  Mr. Kyle Avila is a 65 year old white male who has known atherosclerotic cardiovascular disease.  He has had previous bypass surgery dating back to 1988 with subsequent repeat coronary artery bypass grafting in 1996.  He presents for his regular routine follow-up on August 10, 2000, which involved treadmill testing.  He had had no complaints of chest pain.  He does take nitroglycerin on occasion.  He has been exercising regularly and trying to modify his cardiovascular risk factors.  On August 10, 2000, he exercised in a standard Bruce protocol for a total of 9 minutes to a maximum of 3.4 miles/hour at 14% grade. His blood pressure rose from 120/70 to 160/70.  His target heart rate was not achieved due to beta-blocker effect.  Electrocardiographically, he demonstrated ST depression in leads V1 through V4.  He is subsequently referred now for elective coronary angiography.  PAST MEDICAL HISTORY: 1. Atherosclerotic cardiovascular disease. He presented with unstable angina    and had angioplasty in 1988 with subsequent acute closure leading to    coronary artery bypass grafting x 2 with left internal mammary artery to    the LAD and saphenous vein graft to the right coronary artery in 1988.  He    had posterior myocardial infarction suffered in March of 1992 and at that    time he had angioplasty to an obtuse marginal branch.  He subsequently    underwent redo coronary artery bypass grafting in August of 1996 with    saphenous vein graft to the posterior descending and a saphenous vein graft    to the posterolateral branches.   His last cardiac catheterization occurred    in November of 1996 which revealed well-preserved LV function with mild    anterior hypokinesis, severe disease in all three native vessels, patent    left internal mammary, with patent saphenous vein graft to the posterior    descending but with early diffuse disease in the saphenous vein graft to    the left circumflex system with distal stenoses and graft insertion    stenoses present.  He has been followed along medically since that time. 2. Nephrotic syndrome followed by Kyle Avila. Alvan Dame, M.D. It was first recognized    in July of 1992. 3. Hypercholesterolemia, currently on Statin therapy. 4. History of impotency. 5. Previous history of transient ischemic attack with probable embolus    post cardiac catheterization in November of 1996 with complete resolution    of neurological symptoms. 6. Hypertension.  ALLERGIES:  No known drug allergies.  CURRENT MEDICATIONS: 1. Lopressor 100 mg a day. 2. Norvasc 5 mg a day. 3. Ecotrin daily. 4. Folic acid 1 mg a day. 5. Lipitor 20 mg a day. 6. Imdur 60 mg b.i.d.  FAMILY HISTORY:  Positive for coronary disease. His father died of a heart attack in his early 36s.  SOCIAL HISTORY:  There is no smoking, no excessive alcohol.  REVIEW OF SYSTEMS:  Otherwise as noted above and otherwise unremarkable.  PHYSICAL EXAMINATION:  GENERAL APPEARANCE:  He is a middle-aged white male in no acute distress.  VITAL SIGNS:  Blood pressure at rest 120/70, heart rate 54, respiratory rate 18, he is afebrile.  SKIN:  Warm and dry.  Color is unremarkable.  NECK:  Supple.  No JVD.  LUNGS:  Clear.  CARDIOVASCULAR:  Regular rhythm.  ABDOMEN:  Soft, positive bowel sounds, nontender.  EXTREMITIES:  There is no edema.  NEUROLOGICAL:  Intact.  LABORATORY DATA:  This is currently pending.  IMPRESSION: 1. Abnormal exercise tolerance test with development of ST depression with    exercise. 2. Known coronary  disease with previous bypass surgery as well as subsequent    redo coronary artery bypass grafting dating back to 1996. 3. Hypertension. 4. Hypercholesterolemia, on Lipitor. 5. History of nephrotic syndrome.  PLAN:  Will proceed on with elective coronary angiography. The risk, procedure and benefits have been explained in full detail and he is willing to proceed. Will tentatively proceed on August 17, 2000. DD:  08/10/00 TD:  08/10/00 Job: [id number]

## 2010-09-13 NOTE — Discharge Summary (Signed)
[place]. [facility]  Patient:    Kyle Avila, Kyle Avila Visit Number: [id number] MRN: [id number]          Service Type: SUR Location: [id number] Attending Physician:  Lavonna Monarch Dictated by:   Marcellus Scott, P.A. Admit Date:  12/31/2000 Discharge Date: 01/01/2001   CC:         Kyle Avila. Kyle Avila, M.D.  Kyle Avila, M.D.  Kyle Avila, M.D.   Discharge Summary  DATE OF BIRTH:  Apr 13, 1946  CARDIOLOGIST:  Kyle Avila.  PRIMARY CAREGIVER:  Kyle Avila.  NEPHROLOGIST:  Kyle Avila, et al.  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  SECONDARY DIAGNOSES: 1. Atherosclerotic coronary artery disease, status post coronary artery bypass    graft surgery in 1988; status post redo coronary artery bypass graft    surgery in 1996. 2. Hypertension. 3. Hypercholesterolemia. 4. History of lupus nephritis diagnosed in 1992.  PROCEDURES:  December 31, 2000, right carotid endarterectomy with Dacron patch angioplasty and intraoperative shunting, Dr. Gordy Avila, surgeon.  The patient tolerated the procedure well and was transferred in stable and satisfactory condition to the recovery room.  After awakening from anesthesia, the patient was alert, oriented.  Neurologically he was at baseline.  There was a slight deviation of the tongue to the right.  The patient was able to swallow well, however.  Moved both upper and lower extremities appropriately. Speech was clear.  His mental status remained clear in the postoperative period.  He was afebrile postoperatively.  On postoperative day #1 his potassium was 3.3, and this was replenished.  He had experienced no nausea and vomiting in the postoperative period and was considered a candidate for discharge on postoperative day #1.  At that time he was able to ambulate independently.  The patients incision was  healing nicely without evidence of erythema, swelling, or drainage.  Once again, his speech had remained clear. He was swallowing well.  He was ambulating and had fine motor coordination of the upper extremities.  He goes home on the following medications.  DISCHARGE MEDICATIONS: 1. Percocet 5/325 1-2 tablets p.o. q.4-6h. p.r.n. pain. 2. Enteric-coated aspirin 325 mg daily. 3. Ismo 60 mg daily. 4. Lipitor 20 mg daily, preferably at bedtime. 5. Norvasc 5 mg daily. 6. Folic acid 1 mg daily. 7. Lopressor 100 mg every morning, Lopressor 100 mg 1/2 tablet every evening.  ACTIVITY:  Ambulate to build up his strength.  He is asked not to drive for the next two weeks.  DIET:  Low-sodium, low-cholesterol diet.  WOUND CARE:  He may shower beginning Sunday, January 03, 2001.  He is asked to sponge bathe until that time.  FOLLOW-UP:  He has an appointment with Dr. Amedeo Avila Monday, January 18, 2001, at 10 oclock in the morning.  HISTORY OF PRESENT ILLNESS:  Kyle Avila is a 65 year old male referred to Dr. Gordy Avila by Dr. Dagmar Avila.  He reports to the office of [facility] for evaluation of carotid artery disease. Doppler studies at that time showed severe right internal carotid artery stenosis.  A follow-up MRA of the brain on December 17, 2000, confirmed presence of a tight stenosis of the right ICA.  Dr. Amedeo Avila recommended proceeding with right carotid endarterectomy to decrease risk of stroke.  The patient reports for surgery December 31, 2000.  HOSPITAL COURSE:  Kyle Avila underwent right carotid endarterectomy  with Dacron patch angioplasty on December 31, 2000.  He tolerated the procedure well.  He awoke at baseline neurological status.  He was afebrile in the postoperative period, experienced no nausea and vomiting, was taking oral nourishment well.  He was ambulating independently.  He had fine motor coordination of the upper extremities.   His incision was healing nicely without evidence of erythema, swelling, or drainage.  His mental status had remained clear in the postoperative period.  A low potassium found on postoperative day #1 laboratory was replenished, and Kyle Avila was judged a suitable candidate for discharge to home on postprocedure day #1, to report to Dr. Amedeo Avila office for follow-up in two weeks. Dictated by:   Marcellus Scott, P.A. Attending Physician:  Lavonna Monarch DD:  01/27/01 TD:  01/28/01 Job: [id number]

## 2010-09-13 NOTE — H&P (Signed)
[place]. [facility]  Patient:    Kyle Avila, Kyle Avila Visit Number: [id number] MRN: [id number]          Service Type: Attending:  Gordy Clement, M.D. Dictated by:   Sharene Butters, P.A. Adm. Date:  12/31/00   CC:         Zella Richer. Alvan Dame, M.D.  Ravi R. Dagmar Hait, M.D.  [name], M.D.   History and Physical  DATE OF BIRTH:  [birth year]  CHIEF COMPLAINT:  Right carotid artery disease.  HISTORY OF PRESENT ILLNESS:  Kyle Avila is a pleasant 65 year old white male referred by Dr. Dagmar Hait for evaluation of carotid artery disease.  Doppler evaluation revealed right ICA stenosis.  Followup MRA of the brain in August 22 confirmed the presence of this tight stenosis.  Dr. Amedeo Plenty, based on the findings, recommended to proceed with right CEA in order to reduce the risk of stroke for the patient is completely asymptomatic at this time.  No headache, nausea, vomiting, vertigo, dizziness, falls, seizures, numbness, tingling, muscle weakness, speech impairment, dysphagia, vision changes, syncope, presyncope, memory loss, confusion.  PAST MEDICAL HISTORY:  Carotid artery disease, CAD status post CABG, hypercholesterolemia, hypertension,  history of lupus nephritis diagnosed in 1992.  MEDICATIONS: 1. Sublingual nitroglycerin p.r.n. 2. Lipitor 20 mg q.d. 3. Aspirin q.d. 4. ISMO 60 mg q.d. 5. Norvasc 20 mg q.d.  PAST SURGICAL HISTORY:  Status post CABG x 2 in 1988 with redo CABG x 2 in 1996 by Dr. Prescott Gum, status post cardiac catheterization in 1998 by Dr. [name], status post cardiac catheterization in 2002 by Dr. [name].  ALLERGIES:  NKDA.  REVIEW OF SYSTEMS:  See HPI and past medical history for significant positives.  No diabetes, kidney disease, or asthma.  FAMILY HISTORY:  Noncontributory.  SOCIAL HISTORY:  Married.  Two children.  He is a Educational psychologist.  He denies any tobacco or alcohol intake.  PHYSICAL EXAMINATION  GENERAL:   Well-developed, well-nourished 65 year old white male in no acute distress.  Alert and oriented x 3.  VITAL SIGNS:  Blood pressure 140/78, pulse 60, respirations 16.  HEENT:  Normocephalic, atraumatic.  PERRLA.  EOMI.  Funduscopic examination within normal limits.  NECK:  Supple.  No JVD.  Right bruit.  No lymphadenopathy.  CHEST:  Symmetrical on inspiration.  Lungs clear to auscultation.  CARDIOVASCULAR:  Regular rate and rhythm with a short systolic murmur.  No rubs or gallops.  ABDOMEN:  Soft, nontender.  Bowel sounds x 4.  No masses.  There is an abdominal bruit audible.  GENITOURINARY:  Deferred.  RECTAL:  Deferred.  EXTREMITIES:  No cyanosis, clubbing, or edema.  No ulcerations.  Warm temperature.  Peripheral pulses:  Carotid, femoral, popliteal, dorsalis pedis, and posterior tibialis 2+ bilaterally.  NEUROLOGIC:  Nonfocal.  Gait steady.  DTRs 2+ bilaterally.  Muscle strength 5/5.  ASSESSMENT AND PLAN:  Right internal carotid artery stenosis for right carotid endarterectomy on December 31, 2000.  Dr. Amedeo Plenty has seen and evaluated this patient prior to the admission and has explained the risks and benefits involving the procedure and the patient has agreed to continue. Dictated by:   Sharene Butters, P.A. Attending:  Gordy Clement, M.D. DD:  01/01/01 TD:  01/01/01 Job: [id number]

## 2010-09-13 NOTE — Op Note (Signed)
   TNAMETADEO, HARMELINK                        ACCOUNT NO.:  [id number]   MEDICAL RECORD NO.:  [id number]                   PATIENT TYPE:  AMB   LOCATION:  ENDO                                 FACILITY:  [facility]   PHYSICIAN:  Jeryl Columbia, M.D.                 DATE OF BIRTH:  Jun 04, 1945   DATE OF PROCEDURE:  DATE OF DISCHARGE:                                 OPERATIVE REPORT   PROCEDURE:  Colonoscopy.   INDICATIONS FOR PROCEDURE:  A patient with a history of colon polyps due for  repeat screening, also a family history of colon cancer. Consent was signed  after risks, benefits, methods, and options were thoroughly discussed in the  office.   MEDICINES USED:  Demerol 50, Versed 6.   DESCRIPTION OF PROCEDURE:  Rectal inspection was pertinent for external  hemorrhoids, small. Digital exam was negative. The video pediatric  adjustable colonoscope was inserted, easily advanced around the colon to the  cecum. No obvious abnormality was seen on insertion. The cecum was  identified by the appendiceal orifice and the ileocecal valve. The scope was  then slowly withdrawn. The prep was adequate. There was some liquid stool  that required washing and suctioning. The cecum and ascending were normal.  In the hepatic flexure, a tiny to small polyp was seen and hot biopsied x1.  The scope was further withdrawn. No additional polyps, masses, diverticula  or other abnormalities were seen as we slowly withdrew back to the rectum.  Once back in the rectum, the scope was retroflexed pertinent for some  internal hemorrhoids. The scope was straightened and readvanced a short ways  up the left side of the colon, air was suctioned, scope removed. The patient  tolerated the procedure well and there was no obvious or immediate  complication.   ENDOSCOPIC DIAGNOSIS:  1. Internal and external hemorrhoids.  2. Tiny hepatic flexure polyp hot biopsied.  3. Otherwise within normal limits to the cecum.   PLAN:  Await pathology but probably repeat screening in five years. Happy to  see back p.r.n., otherwise, return care to primary care and Dr. [name] for  customary health care maintenance to include yearly rectals and guaiacs.                                               Jeryl Columbia, M.D.    MEM/MEDQ  D:  12/14/2001  T:  12/14/2001  Job:  [id number]   cc:   [name], M.D.

## 2010-09-13 NOTE — Cardiovascular Report (Signed)
NAME:  Kyle Avila, Kyle Avila NO.:  [id number]   MEDICAL RECORD NO.:  [id number]          PATIENT TYPE:  OIB   LOCATION:  [id number]                         FACILITY:  [facility]   PHYSICIAN:  [name], M.D.DATE OF BIRTH:  1946/01/20   DATE OF PROCEDURE:  12/26/2005  DATE OF DISCHARGE:                              CARDIAC CATHETERIZATION   PROCEDURE:  Implantation of a dual-chamber pulse generator under  fluoroscopy.   INDICATION FOR PROCEDURE:  AV block.   PROCEDURE:  The right subclavicular area was prepped and draped.  The area  was infiltrated with 1% Xylocaine.  A subcutaneous pocket was created in the  prepectoral fascia.  Two punctures were made into the subclavian vein over  top of the first rib.  Using 7-[facility] introducers, the atrial and  ventricular leads were introduced.  The ventricular lead was a Guidant  bipolar screw-in active fixation lead, model 4470, serial number [id number].  The ventricular lead had R-waves that measured 11.6 mV.  The ventricular  capture threshold was 0.4 volts with a current of 0.7 MA with a 0.5  milliseconds pulse width.  The right ventricular lead impedance was 678  ohms.   The atrial lead was a Guidant bipolar screw-in lead, model 4469, serial  number [id number].  The threshold showed P-waves of 2.5 mV.  The atrial  capture threshold was 0.6 volts with a current of 1.4 MA at 0.5 milliseconds  pulse width.  Right atrial lead impedance was 545 ohms.  Both leads were  sutured in place.  The wound was flushed with kanamycin solution.  The leads  were connected to a Medtronic Versa VEDR01 pulse generator, serial number  [id number].  This unit was sutured in place.  The wound was subsequently  closed with 2-0 and then 4-0 Vicryl.  Steri-Strips were applied.  The  patient tolerated the procedure well.      [name], M.D.  Electronically Signed     SNT/MEDQ  D:  12/26/2005  T:  12/27/2005  Job:  [id number]

## 2010-09-13 NOTE — H&P (Signed)
NAME:  BIRL, SEPE NO.:  [id number]   MEDICAL RECORD NO.:  [id number]          PATIENT TYPE:   LOCATION:                                 FACILITY:   PHYSICIAN:  [name], M.D.    DATE OF BIRTH:   DATE OF ADMISSION:  12/26/2005  DATE OF DISCHARGE:                                HISTORY & PHYSICAL   CHIEF COMPLAINT:  None.   HISTORY OF PRESENT ILLNESS:  Kyle Avila is a 65 year old white male who has  a known history of ischemic heart disease.  He presented for a routine  Adenosine Cardiolite study and was noted to have significant AV block.  Lopressor has subsequently been discontinued, and the AV block has  persisted.  He now presents for elective pacemaker implantation.  Clinically, he has been asymptomatic without complaints of chest pain,  shortness of breath, lightheadedness, dizziness, or syncope.   PAST MEDICAL HISTORY:  1. Known atherosclerotic cardiovascular disease with previous angioplasty      in 1988 and subsequent acute closure leading to emergent coronary      artery bypass grafting x2 with left internal mammary to the LAD and a      vein graft to the right coronary.  He had a posterior myocardial      infarction suffered in March of 1992.  At that time, had angioplasty to      an obtuse marginal branch.  He subsequently had redo bypass grafting in      August, 1996 with a saphenous vein graft to the posterior descending      and saphenous vein graft to the posterolateral branch.  His last      catheterization occurred in April, 2002 which showed well preserved LV      function, totally occluded right coronary artery, totally occluded LAD      with segmental severe disease in the left circumflex, patent saphenous      vein graft to the right coronary, patent left internal mammary to the      LAD with collateral flow to the left circumflex and a totally occluded      saphenous vein graft to the left circumflex but with collateral  flow      coming in from the other vessels and grafted segments.  2. Chronic nephrotic syndrome, currently recognized by [name] and      first recognized in 1992.  3. Hyperlipidemia.  4. History of impotency.  5. Previous history of transient ischemic attack with probably embolic      post cardiac catheterization in November, 1996 with subsequent complete      resolution.  6. Hypertension.   ALLERGIES:  None.   CURRENT MEDICATIONS:  1. Caduet 5/20 daily.  2. Diovan 160 b.i.d.  3. Imdur 60 b.i.d.  4. Folic acid daily.  5. Aspirin daily.  6. Lopressor has been discontinued.  He previously was on 100 mg in the      morning, 50 mg in the evening.   FAMILY HISTORY:  Positive for coronary disease.  Father died of a heart  attack in his early 1s.   SOCIAL HISTORY:  He is retired.  There is no smoking or alcohol use.   REVIEW OF SYSTEMS:  As noted above and is otherwise unremarkable.   PHYSICAL EXAMINATION:  VITAL SIGNS:  Weight is 166-1/2 pounds.  Blood  pressure 140/60 sitting, 130/80 standing, heart rate 48, respirations 18.  He is afebrile.  GENERAL:  He is a pleasant middle-aged white male in no acute distress.  SKIN:  Warm and dry.  Color is unremarkable.  LUNGS:  Clear.  HEART:  An irregular and slow rhythm.  ABDOMEN:  Soft.  Positive bowel sounds.  Nontender.  EXTREMITIES:  Without edema.  NEUROLOGIC:  Intact. There are no gross focal deficits.   Pertinent labs are pending.   EKG shows marked bradycardia with second-degree AV block.   OVERALL IMPRESSION:  1. Atrioventricular block, currently asymptomatic.  2. Known ischemic heart disease.  3. Hypertension.  4. Hyperlipidemia.   PLAN:  Will proceed on with pacemaker implantation.  The procedure has been  reviewed in full detail, including the risks and benefits, and he is willing  to proceed on Friday, December 26, 2005.      Doyle Askew, N.P.      [name], M.D.  Electronically  Signed    LC/MEDQ  D:  12/24/2005  T:  12/24/2005  Job:  [id number]   cc:   Windy Kalata, M.D.

## 2010-09-13 NOTE — Cardiovascular Report (Signed)
[place]. [facility]  Patient:    Kyle Avila, Kyle Avila                      MRN: [id number] Proc. Date: 08/17/00 Adm. Date:  [id number] Attending:  Worthy Flank                        Cardiac Catheterization  HISTORY:  Mr. Kyle Avila has had previous coronary artery bypass grafting on two different occasions, last being in 1996.  He had more ST changes on his exercise treadmill test and is referred for followup catheterization.  PROCEDURES PERFORMED:  Left heart catheterization with selective coronary artery angiography, left ventricular angiography, saphenous vein graft angiography x 2 and angiography of the left internal mammary artery.  TYPE AND SITE OF ENTRY:  Percutaneous right femoral artery.  CATHETERS:  A 6 French 4 curved Judkins right and left coronary catheters, 6 French pigtail ventriculographic catheter.  CONTRAST MATERIAL:  Omnipaque.  MEDICATIONS GIVEN PRIOR TO THE PROCEDURE:  Valium 10 mg p.o.  MEDICATIONS GIVEN DURING THE PROCEDURE:  Versed 2 mg IV.  COMMENTS:  The patient tolerated the procedure well.  HEMODYNAMIC DATA:  The aortic pressure was 152/71,  LV was 152/9-25.  There was no aortic valve gradient noted on pullback.  ANGIOGRAPHIC DATA: 1. Left main coronary artery:  Normal. 2. Left anterior descending:  Left anterior descending is totally occluded    proximally. 3. Left circumflex:  The left circumflex has a segmental 70-80% narrowing    proximally.  There is diffuse disease in the obtuse marginal and a totally    occluded more proximal obtuse marginal.  The obtuse marginal has 90%    segmental sequential lesions.  It would be very difficult to treat    percutaneously.  There is collateral flow to the posterolateral branch and    there is collateral flow to the more proximal branch of the circumflex    system via collaterals from the graft to the LAD and graft from the    right coronary artery.  The areas involved in the  left circumflex are    probably 30-40% mm in length.  The proximal left circumflex would    probably be a 3.5 mm vessel, but the obtuse marginal would probably    only be a 2.5 mm vessel. 4. Right coronary artery:  The right coronary artery is totally occluded    proximally.  Saphenous vein graft to the obtuse marginal system is totally occluded.  Saphenous vein graft to the right coronary artery attaches to the posterior descending artery.  It is widely patent with excellent flow.  There is flow back to the crux and into the continuation branch and then collaterals to the distal left circumflex.  Overall flow in this system appears to be reasonably satisfactory.  There is a cascade of septal vessels that fill as well.  Left internal mammary graft to the LAD is widely patent.  There is excellent distal flow and it is a large graft.  It provides retrograde fill of a vessel to the high lateral wall that is either a high obtuse marginal or a high diagonal vessel.  LEFT VENTRICULAR ANGIOGRAM:  Left ventricular angiogram was performed in the RAO position.  Overall cardiac size and silhouette are normal.  Global ejection fraction is probably 55-60%.  Regional wall motion appears to be intact.  The previous anterior wall motion abnormality is really not present  and global left ventricular function and regional wall motion are normal.  OVERALL IMPRESSION: 1. Well preserved left ventricular function. 2. Totally occluded right coronary artery, totally occluded left anterior    descending with segmental severe disease in the left circumflex. 3. Patent saphenous vein graft to the right coronary artery and patent    left internal mammary artery graft to the left anterior descending with    collateral flow to the left circumflex. 4. Totally occluded saphenous vein graft to the left circumflex but with    collateral flow coming in from the other vessels and grafted segments.  DISCUSSION:  I  think that the length of the segmental disease in the left circumflex would make it difficult to try to do percutaneous intervention. He has satisfactory collateral flow and his outlook should be good and we will continue to try to follow him along medically. DD:  08/17/00 TD:  08/17/00 Job: [id number]

## 2010-11-21 ENCOUNTER — Encounter: Payer: 59 | Admitting: *Deleted

## 2010-11-26 ENCOUNTER — Encounter: Payer: Self-pay | Admitting: *Deleted

## 2010-11-29 ENCOUNTER — Other Ambulatory Visit: Payer: Self-pay | Admitting: Internal Medicine

## 2010-11-29 ENCOUNTER — Encounter: Payer: Self-pay | Admitting: Internal Medicine

## 2010-11-29 ENCOUNTER — Ambulatory Visit (INDEPENDENT_AMBULATORY_CARE_PROVIDER_SITE_OTHER): Payer: 59 | Admitting: *Deleted

## 2010-11-29 DIAGNOSIS — Z950 Presence of cardiac pacemaker: Secondary | ICD-10-CM

## 2010-11-29 DIAGNOSIS — I441 Atrioventricular block, second degree: Secondary | ICD-10-CM

## 2010-11-29 LAB — REMOTE PACEMAKER DEVICE
ATRIAL PACING PM: 62
BAMS-0001: 175 {beats}/min
BATTERY VOLTAGE: 2.76 V
VENTRICULAR PACING PM: 100

## 2010-12-04 NOTE — Progress Notes (Signed)
Pacer remote check  

## 2010-12-09 ENCOUNTER — Encounter: Payer: Self-pay | Admitting: Cardiovascular Disease

## 2010-12-18 ENCOUNTER — Encounter: Payer: Self-pay | Admitting: *Deleted

## 2011-01-02 ENCOUNTER — Encounter: Payer: Self-pay | Admitting: Cardiovascular Disease

## 2011-01-02 ENCOUNTER — Ambulatory Visit (INDEPENDENT_AMBULATORY_CARE_PROVIDER_SITE_OTHER): Payer: Medicare Other | Admitting: Cardiovascular Disease

## 2011-01-02 DIAGNOSIS — I10 Essential (primary) hypertension: Secondary | ICD-10-CM

## 2011-01-02 DIAGNOSIS — E7800 Pure hypercholesterolemia, unspecified: Secondary | ICD-10-CM

## 2011-01-02 DIAGNOSIS — I25810 Atherosclerosis of coronary artery bypass graft(s) without angina pectoris: Secondary | ICD-10-CM

## 2011-01-02 DIAGNOSIS — E785 Hyperlipidemia, unspecified: Secondary | ICD-10-CM

## 2011-01-02 MED ORDER — METOPROLOL SUCCINATE ER 50 MG PO TB24: 50.0000 mg | ORAL_TABLET | Freq: Two times a day (BID) | ORAL | Status: DC

## 2011-01-02 MED ORDER — LOSARTAN POTASSIUM 100 MG PO TABS: 100.0000 mg | ORAL_TABLET | Freq: Two times a day (BID) | ORAL | Status: DC

## 2011-01-02 MED ORDER — CLOPIDOGREL BISULFATE 75 MG PO TABS: 75.0000 mg | ORAL_TABLET | Freq: Every day | ORAL | Status: DC

## 2011-01-02 MED ORDER — ATORVASTATIN CALCIUM 80 MG PO TABS: 80.0000 mg | ORAL_TABLET | Freq: Every day | ORAL | Status: DC

## 2011-01-02 MED ORDER — ISOSORBIDE MONONITRATE ER 60 MG PO TB24: 60.0000 mg | ORAL_TABLET | Freq: Every day | ORAL | Status: DC

## 2011-01-02 MED ORDER — AMLODIPINE BESYLATE 5 MG PO TABS: 5.0000 mg | ORAL_TABLET | Freq: Every day | ORAL | Status: DC

## 2011-01-02 NOTE — Patient Instructions (Signed)
Your physician wants you to follow-up in:  6 months. You will receive a reminder letter in the mail two months in advance. If you don't receive a letter, please call our office to schedule the follow-up appointment.   

## 2011-01-02 NOTE — Progress Notes (Signed)
HPI:  This is a 65 year old gentleman presenting for followup evaluation. He has been followed by Dr. [name] for many years. The patient has coronary artery disease and originally underwent coronary bypass surgery in 1988. He had redo coronary bypass in 1996. He has a history of remote myocardial infarction. [name] most recent cardiac catheterization was in 2009 when he underwent overlapping drug-eluting stent implantation in the native left circumflex. At that time his LIMA to LAD and vein graft to distal right coronary artery circulation was widely patent. He had collaterals to an OM branch of the left circumflex noted. He presents today to establish care in Dr. Susa Simmonds retirement.  Overall the patient is doing well. He maintains a very good activity level. He has mild exertional chest tightness with moderate to high level activities. This is unchanged over several years. He denies dyspnea, edema, palpitations, orthopnea, or PND.  Outpatient Encounter Prescriptions as of 01/02/2011  Medication Sig Dispense Refill  . aspirin EC 325 MG tablet Take 325 mg by mouth daily.        Marland Kitchen atorvastatin (LIPITOR) 80 MG tablet Take 80 mg by mouth daily.       . clopidogrel (PLAVIX) 75 MG tablet Take 75 mg by mouth daily.        . colchicine 0.6 MG tablet Take 0.6 mg by mouth as needed.        . diphenhydrAMINE (BENADRYL) 25 mg capsule Take 25 mg by mouth daily.        . folic acid (FOLVITE) A999333 MCG tablet Take 400 mcg by mouth daily.        . isosorbide mononitrate (IMDUR) 60 MG 24 hr tablet Take 60 mg by mouth daily.        Marland Kitchen losartan (COZAAR) 100 MG tablet Take 100 mg by mouth 2 (two) times daily.        . metoprolol (TOPROL-XL) 50 MG 24 hr tablet Take 50 mg by mouth 2 (two) times daily.        . nitroGLYCERIN (NITROSTAT) 0.4 MG SL tablet Place 0.4 mg under the tongue every 5 (five) minutes as needed.        . traMADol (ULTRAM) 50 MG tablet Take 50 mg by mouth every 6 (six) hours as needed.        Marland Kitchen  DISCONTD: losartan-hydrochlorothiazide (HYZAAR) 100-12.5 MG per tablet Take 1 tablet by mouth 2 (two) times daily.        Marland Kitchen DISCONTD: Magnesium-Calcium-Folic Acid (MAGNEBIND A999333) 400-200-1 MG TABS Take 1 tablet by mouth daily.        Marland Kitchen DISCONTD: metoprolol (TOPROL-XL) 100 MG 24 hr tablet Take 50 mg by mouth 2 (two) times daily.          No Known Allergies  Past Medical History  Diagnosis Date  . Mitral insufficiency   . CAD (coronary artery disease)     s/p CABG  . Heart block   . Gout   . HTN (hypertension)   . Hyperlipidemia   . Chronic renal insufficiency     ROS: Negative except as per HPI  BP 136/68  Pulse 60  Ht 5\' 8"  (1.727 m)  Wt 171 lb 1.9 oz (77.62 kg)  BMI 26.02 kg/m2  PHYSICAL EXAM: Pt is alert and oriented, NAD HEENT: normal Neck: JVP - normal, carotids 2+= without bruits Lungs: CTA bilaterally CV: RRR without murmur or gallop Abd: soft, NT, Positive BS, no hepatomegaly Ext: no C/C/E, distal pulses intact and equal Skin: warm/dry  no rash  EKG:  AV sequential pacing at 60 beats per minute  ASSESSMENT AND PLAN:

## 2011-01-22 DIAGNOSIS — E785 Hyperlipidemia, unspecified: Secondary | ICD-10-CM | POA: Insufficient documentation

## 2011-01-22 DIAGNOSIS — I10 Essential (primary) hypertension: Secondary | ICD-10-CM | POA: Insufficient documentation

## 2011-01-22 NOTE — Assessment & Plan Note (Signed)
The patient is on high-dose statin drug as well as folic acid. His lipids are followed by Dr. Dagmar Hait. Goal LDL is less than 70.

## 2011-01-22 NOTE — Assessment & Plan Note (Signed)
The patient has stable Class II angina. He is on a good medical program with of aspirin and Plavix, as well as Lipitor 80 mg, isosorbide, losartan, and metoprolol. He was encouraged to continue with his exercise program. I would like to see him back in 6 months for followup.

## 2011-01-22 NOTE — Assessment & Plan Note (Signed)
Blood pressure is well-controlled on multidrug therapy

## 2011-01-28 LAB — CBC
HCT: 48.5
Hemoglobin: 16.5
MCV: 99.9
Platelets: 177
RBC: 4.86
WBC: 6.3

## 2011-01-28 LAB — BASIC METABOLIC PANEL
Chloride: 104
GFR calc Af Amer: 59 — ABNORMAL LOW
GFR calc non Af Amer: 49 — ABNORMAL LOW
Potassium: 4.8
Sodium: 139

## 2011-01-31 NOTE — Progress Notes (Signed)
Addended by: Lubertha Basque A on: 01/31/2011 09:30 AM   Modules accepted: Orders

## 2011-02-27 ENCOUNTER — Ambulatory Visit (INDEPENDENT_AMBULATORY_CARE_PROVIDER_SITE_OTHER): Payer: Medicare Other | Admitting: *Deleted

## 2011-02-27 DIAGNOSIS — I441 Atrioventricular block, second degree: Secondary | ICD-10-CM

## 2011-02-27 DIAGNOSIS — Z950 Presence of cardiac pacemaker: Secondary | ICD-10-CM

## 2011-02-28 LAB — REMOTE PACEMAKER DEVICE
AL AMPLITUDE: 2.8 mv
ATRIAL PACING PM: 62
BAMS-0001: 175 {beats}/min
VENTRICULAR PACING PM: 100

## 2011-03-03 DIAGNOSIS — H8110 Benign paroxysmal vertigo, unspecified ear: Secondary | ICD-10-CM | POA: Insufficient documentation

## 2011-03-06 ENCOUNTER — Encounter: Payer: Self-pay | Admitting: *Deleted

## 2011-03-14 NOTE — Progress Notes (Signed)
Pacer remote check  

## 2011-04-17 ENCOUNTER — Telehealth: Payer: Self-pay | Admitting: Cardiovascular Disease

## 2011-04-17 NOTE — Telephone Encounter (Signed)
Pt wants to reduce his ASA from 325mg  to 81mg  due to bruising

## 2011-04-17 NOTE — Telephone Encounter (Signed)
I spoke with the pt and made him aware that he can decrease his ASA to 81mg  daily due to increased bruising.  The pt does take plavix also.  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 The pt will call the office if he continues to notice worsening of bruises.  The pt denies any bleeding.

## 2011-05-14 ENCOUNTER — Encounter: Payer: Medicare Other | Admitting: Internal Medicine

## 2011-05-20 ENCOUNTER — Encounter: Payer: Self-pay | Admitting: Internal Medicine

## 2011-05-20 ENCOUNTER — Ambulatory Visit (INDEPENDENT_AMBULATORY_CARE_PROVIDER_SITE_OTHER): Payer: Medicare Other | Admitting: Internal Medicine

## 2011-05-20 ENCOUNTER — Other Ambulatory Visit: Payer: Self-pay | Admitting: *Deleted

## 2011-05-20 DIAGNOSIS — I4430 Unspecified atrioventricular block: Secondary | ICD-10-CM

## 2011-05-20 DIAGNOSIS — I472 Ventricular tachycardia, unspecified: Secondary | ICD-10-CM

## 2011-05-20 DIAGNOSIS — I10 Essential (primary) hypertension: Secondary | ICD-10-CM

## 2011-05-20 DIAGNOSIS — I25810 Atherosclerosis of coronary artery bypass graft(s) without angina pectoris: Secondary | ICD-10-CM

## 2011-05-20 DIAGNOSIS — Z950 Presence of cardiac pacemaker: Secondary | ICD-10-CM

## 2011-05-20 HISTORY — DX: Ventricular tachycardia: I47.2

## 2011-05-20 HISTORY — DX: Ventricular tachycardia, unspecified: I47.20

## 2011-05-20 LAB — PACEMAKER DEVICE OBSERVATION
AL AMPLITUDE: 2.8 mv
AL THRESHOLD: 0.5 V
BAMS-0001: 175 {beats}/min
BATTERY VOLTAGE: 2.76 V
RV LEAD AMPLITUDE: 11.2 mv

## 2011-05-20 MED ORDER — NITROGLYCERIN 0.4 MG SL SUBL: 0.4000 mg | SUBLINGUAL_TABLET | SUBLINGUAL | Status: DC | PRN

## 2011-05-20 NOTE — Assessment & Plan Note (Signed)
Continue with stable angina. We will not activate rate response so as not to augment cardiac demand

## 2011-05-20 NOTE — Assessment & Plan Note (Signed)
Stable post pacing 

## 2011-05-20 NOTE — Assessment & Plan Note (Signed)
[name] device was interrogated.  The information was reviewed. No changes were made in the programming.    

## 2011-05-20 NOTE — Assessment & Plan Note (Signed)
Well-controlled on current meds 

## 2011-05-20 NOTE — Progress Notes (Signed)
  HPI  Kyle Avila is a 66 y.o. male seein in followup for pacemaker inserted for high gradeheart block  He has a complex cardiovascular history with bypass surgery in ;redo bypass in 96 and revascularization with stenting of the circumflex in 2009. At that time he had total LAD and RCA disease; his left ventricular function then was okay.   Marland Kitchen  He continues to have chest pain with heavy exertion like skiing which she did last week in -23 rather  Past Medical History  Diagnosis Date  . Mitral insufficiency   . CAD (coronary artery disease)     s/p CABG  . Heart block   . Gout   . HTN (hypertension)   . Hyperlipidemia   . Chronic renal insufficiency   . 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 Coronary artery bypass graft     with redo cabg  . Cardiac catheterization   . Pacemaker insertion 2007  . Carotid endarterectomy 2002    Current Outpatient Prescriptions  Medication Sig Dispense Refill  . amLODipine (NORVASC) 5 MG tablet Take 1 tablet (5 mg total) by mouth daily.  90 tablet  3  . atorvastatin (LIPITOR) 80 MG tablet Take 1 tablet (80 mg total) by mouth daily.  90 tablet  3  . clopidogrel (PLAVIX) 75 MG tablet Take 1 tablet (75 mg total) by mouth daily.  90 tablet  3  . colchicine 0.6 MG tablet Take 0.6 mg by mouth as needed.        . diphenhydrAMINE (BENADRYL) 25 mg capsule Take 25 mg by mouth daily.        . folic acid (FOLVITE) A999333 MCG tablet Take 400 mcg by mouth daily.        . isosorbide mononitrate (IMDUR) 60 MG 24 hr tablet Take 1 tablet (60 mg total) by mouth daily.  90 tablet  3  . losartan (COZAAR) 100 MG tablet Take 1 tablet (100 mg total) by mouth 2 (two) times daily.  180 tablet  3  . metoprolol (TOPROL-XL) 50 MG 24 hr tablet Take 1 tablet (50 mg total) by mouth 2 (two) times daily.  180 tablet  3  . nitroGLYCERIN (NITROSTAT) 0.4 MG SL tablet Place 0.4 mg under the tongue every 5 (five) minutes as  needed.        . traMADol (ULTRAM) 50 MG tablet Take 50 mg by mouth every 6 (six) hours as needed.        Marland Kitchen aspirin EC 81 MG EC tablet Take 1 tablet (81 mg total) by mouth daily.  1 tablet  0    No Known Allergies  Review of Systems negative except from HPI and PMH  Physical Exam BP 112/62  Pulse 64  Ht 5\' 8"  (1.727 m)  Wt 171 lb (77.565 kg)  BMI 26.00 kg/m2 Well developed and well nourished in no acute distress HENT normal E scleral and icterus clear Neck Supple JVP flat; carotids brisk and full Clear to ausculation Regular rate and rhythm, no murmurs gallops or rub Soft with active bowel sounds No clubbing cyanosis none Edema Alert and oriented, grossly normal motor and sensory function Skin Warm and Dry   Assessment and  Plan

## 2011-05-20 NOTE — Assessment & Plan Note (Signed)
Nonsustained VT is seen but in the context of normal left ventricular function no other evaluation is necessary from my point of view. He'll be following up with his PCP for her regular blood work

## 2011-05-20 NOTE — Patient Instructions (Signed)
Remote monitoring is used to monitor your Pacemaker of ICD from home. This monitoring reduces the number of office visits required to check your device to one time per year. It allows [place] to keep an eye on the functioning of your device to ensure it is working properly. You are scheduled for a device check from home on 08/21/11. You may send your transmission at any time that day. If you have a wireless device, the transmission will be sent automatically. After your physician reviews your transmission, you will receive a postcard with your next transmission date.  Your physician wants you to follow-up in: 1 year with Dr. Caryl Comes. You will receive a reminder letter in the mail two months in advance. If you don't receive a letter, please call our office to schedule the follow-up appointment.  Your physician recommends that you continue on your current medications as directed. Please refer to the Current Medication list given to you today.

## 2011-06-16 ENCOUNTER — Encounter: Payer: Self-pay | Admitting: Vascular Surgery

## 2011-06-17 ENCOUNTER — Encounter: Payer: Self-pay | Admitting: Vascular Surgery

## 2011-06-17 ENCOUNTER — Other Ambulatory Visit (INDEPENDENT_AMBULATORY_CARE_PROVIDER_SITE_OTHER): Payer: Medicare Other | Admitting: *Deleted

## 2011-06-17 ENCOUNTER — Ambulatory Visit (INDEPENDENT_AMBULATORY_CARE_PROVIDER_SITE_OTHER): Payer: Medicare Other | Admitting: Vascular Surgery

## 2011-06-17 VITALS — BP 145/70 | HR 60 | Resp 16 | Ht 69.0 in | Wt 169.4 lb

## 2011-06-17 DIAGNOSIS — I6529 Occlusion and stenosis of unspecified carotid artery: Secondary | ICD-10-CM

## 2011-06-17 DIAGNOSIS — Z48812 Encounter for surgical aftercare following surgery on the circulatory system: Secondary | ICD-10-CM

## 2011-06-17 NOTE — Progress Notes (Signed)
The patient presents today for followup of right carotid endarterectomy in 2002. He remains quite active for severe ischemia for 2 weeks earlier in the winter. He has had no neurologic deficits. Specifically he has had no amaurosis fugax transient ischemic attack or stroke.  Past Medical History  Diagnosis Date  . Mitral insufficiency   . CAD (coronary artery disease)     s/p CABG  . Heart block   . Gout   . HTN (hypertension)   . Hyperlipidemia   . Chronic renal insufficiency   . 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 Smoking status: Never Smoker   . Smokeless tobacco: Not on file  . Alcohol Use: Yes     occasional    Family History  Problem Relation Age of Onset  . Heart attack Father     17s    No Known Allergies  Current outpatient prescriptions:amLODipine (NORVASC) 5 MG tablet, Take 1 tablet (5 mg total) by mouth daily., Disp: 90 tablet, Rfl: 3;  aspirin EC 81 MG EC tablet, Take 325 mg by mouth daily. , Disp: 1 tablet, Rfl: 0;  atorvastatin (LIPITOR) 80 MG tablet, Take 1 tablet (80 mg total) by mouth daily., Disp: 90 tablet, Rfl: 3;  clopidogrel (PLAVIX) 75 MG tablet, Take 1 tablet (75 mg total) by mouth daily., Disp: 90 tablet, Rfl: 3 colchicine 0.6 MG tablet, Take 0.6 mg by mouth as needed.  , Disp: , Rfl: ;  diphenhydrAMINE (BENADRYL) 25 mg capsule, Take 25 mg by mouth daily.  , Disp: , Rfl: ;  folic acid (FOLVITE) A999333 MCG tablet, Take 400 mcg by mouth daily.  , Disp: , Rfl: ;  isosorbide mononitrate (IMDUR) 60 MG 24 hr tablet, Take 1 tablet (60 mg total) by mouth daily., Disp: 90 tablet, Rfl: 3 losartan (COZAAR) 100 MG tablet, Take 1 tablet (100 mg total) by mouth 2 (two) times daily., Disp: 180 tablet, Rfl: 3;  metoprolol (TOPROL-XL) 50 MG 24 hr tablet, Take 1 tablet (50 mg total) by mouth 2 (two) times daily., Disp: 180 tablet, Rfl: 3;  nitroGLYCERIN (NITROSTAT) 0.4 MG SL tablet, Place 1 tablet (0.4 mg total) under the  tongue every 5 (five) minutes as needed., Disp: 25 tablet, Rfl: 6 traMADol (ULTRAM) 50 MG tablet, Take 50 mg by mouth every 6 (six) hours as needed.  , Disp: , Rfl:   BP 145/70  Pulse 60  Resp 16  Ht 5\' 9"  (1.753 m)  Wt 169 lb 6.4 oz (76.839 kg)  BMI 25.02 kg/m2  Body mass index is 25.02 kg/(m^2).       Physical exam well-developed well-nourished white male appearing stated age. Right carotid incision well healed with no bruits bilaterally. Grossly intact neurologically. Plus radial pulses bilaterally.  Carotid duplex widely patent endarterectomy on the right and no evidence of stenosis in the left carotid system  Impression and plan stable status post right carotid endarterectomy. He will undergo a repeat duplex in one year to notify us if he develops any neurologic deficit

## 2011-06-19 ENCOUNTER — Encounter: Payer: Self-pay | Admitting: Internal Medicine

## 2011-06-19 NOTE — Procedures (Unsigned)
CAROTID DUPLEX EXAM  INDICATION:  Right CEA  HISTORY: Diabetes:  No Cardiac:  Yes Hypertension:  Yes Smoking:  No Previous Surgery:  Right CEA 2002 CV History: Amaurosis Fugax No, Paresthesias No, Hemiparesis No                                      RIGHT             LEFT Brachial systolic pressure:         118               118 Brachial Doppler waveforms:         WNL               WNL Vertebral direction of flow:        Antegrade         Antegrade DUPLEX VELOCITIES (cm/sec) CCA peak systolic                   114               [id number] ECA peak systolic                   94                88 ICA peak systolic                   54                62 ICA end diastolic                   11                17 PLAQUE MORPHOLOGY:                  Heterogeneous     Heterogeneous PLAQUE AMOUNT:                      Minimal           Mild PLAQUE LOCATION:                    CCA               CCA/ICA  IMPRESSION: 1. Widely patent right carotid endarterectomy without evidence of     restenosis or hyperplasia. 2. 1%-39% left internal carotid artery stenosis. 3. Mild plaquing of the bilateral common carotid artery. 4. Bilateral vertebral arteries are antegrade and within normal     limits.  Right vertebral artery was previously reported as     abnormal.  Could not demonstrate that on today's exam.  ___________________________________________ Rosetta Posner, M.D.  LT/MEDQ  D:  06/17/2011  T:  06/17/2011  Job:  [id number]

## 2011-06-24 ENCOUNTER — Encounter: Payer: Self-pay | Admitting: Internal Medicine

## 2011-07-01 ENCOUNTER — Other Ambulatory Visit: Payer: 59

## 2011-07-01 ENCOUNTER — Ambulatory Visit: Payer: 59 | Admitting: Vascular Surgery

## 2011-07-09 ENCOUNTER — Other Ambulatory Visit: Payer: Self-pay | Admitting: *Deleted

## 2011-07-09 DIAGNOSIS — I6529 Occlusion and stenosis of unspecified carotid artery: Secondary | ICD-10-CM

## 2011-08-21 ENCOUNTER — Encounter: Payer: Medicare Other | Admitting: *Deleted

## 2011-08-28 ENCOUNTER — Encounter: Payer: Self-pay | Admitting: *Deleted

## 2011-09-04 ENCOUNTER — Ambulatory Visit (INDEPENDENT_AMBULATORY_CARE_PROVIDER_SITE_OTHER): Payer: Medicare Other | Admitting: *Deleted

## 2011-09-04 ENCOUNTER — Encounter: Payer: Self-pay | Admitting: Internal Medicine

## 2011-09-04 DIAGNOSIS — I472 Ventricular tachycardia: Secondary | ICD-10-CM

## 2011-09-04 DIAGNOSIS — I4430 Unspecified atrioventricular block: Secondary | ICD-10-CM

## 2011-09-04 LAB — REMOTE PACEMAKER DEVICE
ATRIAL PACING PM: 62
BAMS-0001: 175 {beats}/min
RV LEAD IMPEDENCE PM: 473 Ohm
VENTRICULAR PACING PM: 99

## 2011-09-12 ENCOUNTER — Encounter: Payer: Self-pay | Admitting: *Deleted

## 2011-09-18 ENCOUNTER — Telehealth: Payer: Self-pay | Admitting: Cardiovascular Disease

## 2011-09-18 NOTE — Telephone Encounter (Signed)
Pt has appt 09-30-11 and wants to know if he needs blood work

## 2011-09-18 NOTE — Telephone Encounter (Signed)
LM on home phone--does not appear that dr cooper wants any pre-visit labs done before 6/14 appoint

## 2011-09-19 NOTE — Progress Notes (Signed)
PPM remote 

## 2011-09-30 ENCOUNTER — Encounter: Payer: Self-pay | Admitting: Cardiovascular Disease

## 2011-09-30 ENCOUNTER — Ambulatory Visit (INDEPENDENT_AMBULATORY_CARE_PROVIDER_SITE_OTHER): Payer: Medicare Other | Admitting: Cardiovascular Disease

## 2011-09-30 VITALS — BP 132/80 | HR 59 | Ht 69.0 in | Wt 171.8 lb

## 2011-09-30 DIAGNOSIS — I25810 Atherosclerosis of coronary artery bypass graft(s) without angina pectoris: Secondary | ICD-10-CM

## 2011-09-30 DIAGNOSIS — E7800 Pure hypercholesterolemia, unspecified: Secondary | ICD-10-CM

## 2011-09-30 MED ORDER — LOSARTAN POTASSIUM 100 MG PO TABS: 100.0000 mg | ORAL_TABLET | Freq: Two times a day (BID) | ORAL | Status: DC

## 2011-09-30 MED ORDER — NITROGLYCERIN 0.4 MG SL SUBL: 0.4000 mg | SUBLINGUAL_TABLET | SUBLINGUAL | Status: DC | PRN

## 2011-09-30 MED ORDER — ISOSORBIDE MONONITRATE ER 60 MG PO TB24: 60.0000 mg | ORAL_TABLET | Freq: Every day | ORAL | Status: DC

## 2011-09-30 NOTE — Progress Notes (Signed)
HPI:  66 year old gentleman presenting for followup evaluation. The patient has CAD status post CABG in 1988. He then underwent redo CABG in 1996. He's had remote myocardial infarction. His most recent cardiac catheterization in 2009 demonstrated severe disease in the left circumflex and he was treated with overlapping drug-eluting stents.  The patient reports stable symptoms of exertional angina. He is limited by angina when he takes a walk after a big meal. Otherwise he has had no recent symptoms. His symptoms are unchanged over time. He does not take sublingual nitroglycerin a regular basis. He denies dyspnea, edema, orthopnea, PND, or palpitations.   Outpatient Encounter Prescriptions as of 09/30/2011  Medication Sig Dispense Refill  . amLODipine (NORVASC) 5 MG tablet Take 1 tablet (5 mg total) by mouth daily.  90 tablet  3  . aspirin EC 81 MG EC tablet Take 325 mg by mouth daily.   1 tablet  0  . atorvastatin (LIPITOR) 80 MG tablet Take 1 tablet (80 mg total) by mouth daily.  90 tablet  3  . clopidogrel (PLAVIX) 75 MG tablet Take 1 tablet (75 mg total) by mouth daily.  90 tablet  3  . colchicine 0.6 MG tablet Take 0.6 mg by mouth as needed.        . diphenhydrAMINE (BENADRYL) 25 mg capsule Take 25 mg by mouth daily.        . folic acid (FOLVITE) A999333 MCG tablet Take 400 mcg by mouth daily.        . isosorbide mononitrate (IMDUR) 60 MG 24 hr tablet Take 1 tablet (60 mg total) by mouth daily.  90 tablet  3  . losartan (COZAAR) 100 MG tablet Take 1 tablet (100 mg total) by mouth 2 (two) times daily.  180 tablet  3  . metoprolol (TOPROL-XL) 50 MG 24 hr tablet Take 1 tablet (50 mg total) by mouth 2 (two) times daily.  180 tablet  3  . nitroGLYCERIN (NITROSTAT) 0.4 MG SL tablet Place 1 tablet (0.4 mg total) under the tongue every 5 (five) minutes as needed.  25 tablet  6  . traMADol (ULTRAM) 50 MG tablet Take 50 mg by mouth every 6 (six) hours as needed.        Marland Kitchen DISCONTD: isosorbide mononitrate  (IMDUR) 60 MG 24 hr tablet Take 1 tablet (60 mg total) by mouth daily.  90 tablet  3  . DISCONTD: losartan (COZAAR) 100 MG tablet Take 1 tablet (100 mg total) by mouth 2 (two) times daily.  180 tablet  3  . DISCONTD: nitroGLYCERIN (NITROSTAT) 0.4 MG SL tablet Place 1 tablet (0.4 mg total) under the tongue every 5 (five) minutes as needed.  25 tablet  6    No Known Allergies  Past Medical History  Diagnosis Date  . Mitral insufficiency   . CAD (coronary artery disease)     s/p CABG  . Heart block   . Gout   . HTN (hypertension)   . Hyperlipidemia   . Chronic renal insufficiency   . 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 CAD status post CABG. The patient has stable exertional angina, CCS class II. He will continue on his same medical program without changes.  2. Hyperlipidemia. Lipids were reviewed from February. His cholesterol is 154, LDL 83, HDL 45. He'll remain on atorvastatin 80 mg. He will consider enrollment in the Accelerate trial.  3. Hypertension. Blood pressure well controlled on losartan, amlodipine, and Toprol-XL.  For followup, I'll see the patient back in one year. He understands to call if his anginal symptoms accelerate.   [name] 09/30/2011 5:51 PM

## 2011-09-30 NOTE — Patient Instructions (Signed)
Your physician wants you to follow-up in: 1 YEAR with Dr Cooper.  You will receive a reminder letter in the mail two months in advance. If you don't receive a letter, please call our office to schedule the follow-up appointment.  Your physician recommends that you continue on your current medications as directed. Please refer to the Current Medication list given to you today.  

## 2011-12-09 ENCOUNTER — Encounter: Payer: Self-pay | Admitting: Internal Medicine

## 2011-12-11 ENCOUNTER — Ambulatory Visit (INDEPENDENT_AMBULATORY_CARE_PROVIDER_SITE_OTHER): Payer: Medicare Other | Admitting: *Deleted

## 2011-12-11 DIAGNOSIS — I4430 Unspecified atrioventricular block: Secondary | ICD-10-CM

## 2011-12-12 LAB — REMOTE PACEMAKER DEVICE
BATTERY VOLTAGE: 2.75 V
VENTRICULAR PACING PM: 100

## 2011-12-30 ENCOUNTER — Encounter: Payer: Self-pay | Admitting: *Deleted

## 2012-01-30 ENCOUNTER — Encounter: Payer: Self-pay | Admitting: Cardiology

## 2012-02-19 ENCOUNTER — Other Ambulatory Visit: Payer: Self-pay | Admitting: Cardiovascular Disease

## 2012-02-19 DIAGNOSIS — I25810 Atherosclerosis of coronary artery bypass graft(s) without angina pectoris: Secondary | ICD-10-CM

## 2012-02-19 DIAGNOSIS — E7800 Pure hypercholesterolemia, unspecified: Secondary | ICD-10-CM

## 2012-02-19 MED ORDER — LOSARTAN POTASSIUM 100 MG PO TABS: 100.0000 mg | ORAL_TABLET | Freq: Two times a day (BID) | ORAL | Status: DC

## 2012-02-19 MED ORDER — ATORVASTATIN CALCIUM 80 MG PO TABS: 80.0000 mg | ORAL_TABLET | Freq: Every day | ORAL | Status: DC

## 2012-02-19 MED ORDER — CLOPIDOGREL BISULFATE 75 MG PO TABS: 75.0000 mg | ORAL_TABLET | Freq: Every day | ORAL | Status: DC

## 2012-02-19 MED ORDER — ISOSORBIDE MONONITRATE ER 60 MG PO TB24: 60.0000 mg | ORAL_TABLET | Freq: Every day | ORAL | Status: DC

## 2012-02-19 NOTE — Telephone Encounter (Signed)
Pt would like Rx mailed to him so he can mail them out with the two others that he has at home

## 2012-02-19 NOTE — Telephone Encounter (Signed)
Prescriptions printed. Dr Burt Knack will be back in the office on 02/23/12.  Will have MD sign at that time and then mail to the patient.

## 2012-02-25 ENCOUNTER — Telehealth: Payer: Self-pay | Admitting: Cardiovascular Disease

## 2012-02-25 DIAGNOSIS — E7800 Pure hypercholesterolemia, unspecified: Secondary | ICD-10-CM

## 2012-02-25 DIAGNOSIS — I25810 Atherosclerosis of coronary artery bypass graft(s) without angina pectoris: Secondary | ICD-10-CM

## 2012-02-25 MED ORDER — CLOPIDOGREL BISULFATE 75 MG PO TABS: 75.0000 mg | ORAL_TABLET | Freq: Every day | ORAL | Status: DC

## 2012-02-25 MED ORDER — ATORVASTATIN CALCIUM 80 MG PO TABS: 80.0000 mg | ORAL_TABLET | Freq: Every day | ORAL | Status: DC

## 2012-02-25 MED ORDER — METOPROLOL SUCCINATE ER 50 MG PO TB24: 50.0000 mg | ORAL_TABLET | Freq: Two times a day (BID) | ORAL | Status: DC

## 2012-02-25 NOTE — Telephone Encounter (Signed)
New Problem:    Patient called in needing a written prescription for his atorvastatin (LIPITOR) 80 MG tablet, clopidogrel (PLAVIX) 75 MG tablet, and metoprolol (TOPROL-XL) 50 MG 24 hr tablet mailed to his home instead of being sent to his pharmacy, so he can shop around for prices that he is comfortable.  Please call back.

## 2012-02-25 NOTE — Telephone Encounter (Signed)
Patient called requesting Rx's be mailed again since he has not received (in system printed out on 10/24).  Explained that the mail does take a while since it has to go to the hospital prior to post office, he stated he was only 12 miles from here and he should have received by now.  Also explained that if he did not want to wait on the mail he was welcome to come by and pick Rx's up on Friday.  He requested they just be mailed again.  If he gets between now and then he will call Lauren back and let her know.

## 2012-03-01 ENCOUNTER — Encounter: Payer: Self-pay | Admitting: Cardiovascular Disease

## 2012-03-01 NOTE — Telephone Encounter (Signed)
New problem.    colonscopy on 11/7 . Please advise on plavix.

## 2012-03-01 NOTE — Telephone Encounter (Signed)
This encounter was created in error - please disregard.

## 2012-03-04 ENCOUNTER — Other Ambulatory Visit: Payer: Self-pay | Admitting: Gastroenterology

## 2012-03-15 ENCOUNTER — Ambulatory Visit (INDEPENDENT_AMBULATORY_CARE_PROVIDER_SITE_OTHER): Payer: Medicare Other | Admitting: *Deleted

## 2012-03-15 DIAGNOSIS — Z950 Presence of cardiac pacemaker: Secondary | ICD-10-CM

## 2012-03-15 DIAGNOSIS — I4430 Unspecified atrioventricular block: Secondary | ICD-10-CM

## 2012-03-19 LAB — REMOTE PACEMAKER DEVICE
AL AMPLITUDE: 2.8 mv
AL THRESHOLD: 0.625 V
BAMS-0001: 175 {beats}/min
BATTERY VOLTAGE: 2.75 V
VENTRICULAR PACING PM: 100

## 2012-03-30 ENCOUNTER — Encounter: Payer: Self-pay | Admitting: *Deleted

## 2012-04-09 ENCOUNTER — Encounter: Payer: Self-pay | Admitting: Internal Medicine

## 2012-05-17 DIAGNOSIS — N400 Enlarged prostate without lower urinary tract symptoms: Secondary | ICD-10-CM | POA: Insufficient documentation

## 2012-05-24 ENCOUNTER — Encounter: Payer: Self-pay | Admitting: Internal Medicine

## 2012-05-24 ENCOUNTER — Ambulatory Visit (INDEPENDENT_AMBULATORY_CARE_PROVIDER_SITE_OTHER): Payer: Medicare Other | Admitting: Internal Medicine

## 2012-05-24 VITALS — BP 108/62 | HR 60 | Ht 68.0 in | Wt 171.0 lb

## 2012-05-24 DIAGNOSIS — I472 Ventricular tachycardia, unspecified: Secondary | ICD-10-CM

## 2012-05-24 DIAGNOSIS — Z950 Presence of cardiac pacemaker: Secondary | ICD-10-CM

## 2012-05-24 DIAGNOSIS — I4430 Unspecified atrioventricular block: Secondary | ICD-10-CM

## 2012-05-24 LAB — PACEMAKER DEVICE OBSERVATION
AL AMPLITUDE: 2.8 mv
AL IMPEDENCE PM: 512 Ohm
AL THRESHOLD: 0.625 V
BATTERY VOLTAGE: 2.75 V
RV LEAD THRESHOLD: 0.75 V

## 2012-05-24 NOTE — Assessment & Plan Note (Signed)
No intercurrent ventricular tachycardia 

## 2012-05-24 NOTE — Addendum Note (Signed)
Addended by: Nyoka Lint on: 05/24/2012 04:25 PM   Modules accepted: Orders

## 2012-05-24 NOTE — Progress Notes (Signed)
Patient Care Team: Tivis Ringer, MD as PCP - General (Internal Medicine) Windy Kalata, MD as Consulting Physician (Nephrology) [name], MD (Cardiology) Deboraha Sprang, MD (Cardiology)   HPI  Kyle Avila is a 67 y.o. male seein in followup for pacemaker inserted for high gradeheart block    He has a complex cardiovascular history with bypass surgery in ;redo bypass in 96 and revascularization with stenting of the circumflex in 2009. At that time he had total LAD and RCA disease; his left ventricular function then was okay. Heart rate excursion has been limited he has pacemaker because of concern for angina   The patient denies chest pain, shortness of breath, nocturnal dyspnea, orthopnea or peripheral edema.  There have been no palpitations, lightheadedness or syncope.   .      Past Medical History  Diagnosis Date  . Mitral insufficiency   . CAD (coronary artery disease)     s/p CABG  . Heart block   . Gout   . HTN (hypertension)   . Hyperlipidemia   . Chronic renal insufficiency   . 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 Coronary artery bypass graft     with redo cabg  . Cardiac catheterization   . Pacemaker insertion 2007  . Carotid endarterectomy 2002    Current Outpatient Prescriptions  Medication Sig Dispense Refill  . amLODipine (NORVASC) 5 MG tablet Take 1 tablet (5 mg total) by mouth daily.  90 tablet  3  . aspirin EC 81 MG EC tablet Take 325 mg by mouth daily.   1 tablet  0  . atorvastatin (LIPITOR) 80 MG tablet Take 1 tablet (80 mg total) by mouth daily.  90 tablet  3  . clopidogrel (PLAVIX) 75 MG tablet Take 1 tablet (75 mg total) by mouth daily.  90 tablet  3  . colchicine 0.6 MG tablet Take 0.6 mg by mouth as needed.        . diphenhydrAMINE (BENADRYL) 25 mg capsule Take 25 mg by mouth daily.        . folic acid (FOLVITE) A999333 MCG tablet Take 400 mcg by mouth daily.        .  isosorbide mononitrate (IMDUR) 60 MG 24 hr tablet Take 1 tablet (60 mg total) by mouth daily.  90 tablet  3  . losartan (COZAAR) 100 MG tablet Take 1 tablet (100 mg total) by mouth 2 (two) times daily.  180 tablet  3  . metoprolol succinate (TOPROL-XL) 50 MG 24 hr tablet Take 1 tablet (50 mg total) by mouth 2 (two) times daily.  180 tablet  3  . nitroGLYCERIN (NITROSTAT) 0.4 MG SL tablet Place 1 tablet (0.4 mg total) under the tongue every 5 (five) minutes as needed.  25 tablet  6  . traMADol (ULTRAM) 50 MG tablet Take 50 mg by mouth every 6 (six) hours as needed.          No Known Allergies  Review of Systems negative except from HPI and PMH  Physical Exam There were no vitals taken for this visit. Well developed and well nourished in no acute distress HENT normal E scleral and icterus clear Neck Supple JVP flat; carotids brisk and full/ scar on neck Clear to ausculation  Regular rate and rhythm, no murmurs gallops or rub Soft with active bowel sounds No clubbing cyanosis none Edema Alert and oriented, grossly normal motor and sensory function Skin Warm  and Dry    Assessment and  Plan

## 2012-05-24 NOTE — Assessment & Plan Note (Signed)
[name] device was interrogated.  The information was reviewed. No changes were made in the programming.    

## 2012-05-24 NOTE — Patient Instructions (Addendum)
Remote monitoring is used to monitor your Pacemaker of ICD from home. This monitoring reduces the number of office visits required to check your device to one time per year. It allows [place] to keep an eye on the functioning of your device to ensure it is working properly. You are scheduled for a device check from home on August 23, 2012. You may send your transmission at any time that day. If you have a wireless device, the transmission will be sent automatically. After your physician reviews your transmission, you will receive a postcard with your next transmission date.  Your physician wants you to follow-up in: 1 year with Dr Caryl Comes.  You will receive a reminder letter in the mail two months in advance. If you don't receive a letter, please call our office to schedule the follow-up appointment.

## 2012-05-24 NOTE — Assessment & Plan Note (Signed)
100% ventricularly paced

## 2012-05-27 ENCOUNTER — Encounter: Payer: Medicare Other | Admitting: Internal Medicine

## 2012-06-22 ENCOUNTER — Ambulatory Visit: Payer: Medicare Other | Admitting: Neurosurgery

## 2012-06-22 ENCOUNTER — Other Ambulatory Visit: Payer: Medicare Other

## 2012-07-06 ENCOUNTER — Other Ambulatory Visit: Payer: Self-pay | Admitting: *Deleted

## 2012-07-06 DIAGNOSIS — Z48812 Encounter for surgical aftercare following surgery on the circulatory system: Secondary | ICD-10-CM

## 2012-07-13 ENCOUNTER — Encounter: Payer: Self-pay | Admitting: Internal Medicine

## 2012-07-20 ENCOUNTER — Other Ambulatory Visit (INDEPENDENT_AMBULATORY_CARE_PROVIDER_SITE_OTHER): Payer: Medicare Other | Admitting: *Deleted

## 2012-07-20 ENCOUNTER — Ambulatory Visit: Payer: Medicare Other | Admitting: Neurosurgery

## 2012-07-20 ENCOUNTER — Other Ambulatory Visit: Payer: Self-pay

## 2012-07-20 DIAGNOSIS — I6529 Occlusion and stenosis of unspecified carotid artery: Secondary | ICD-10-CM

## 2012-07-20 DIAGNOSIS — Z48812 Encounter for surgical aftercare following surgery on the circulatory system: Secondary | ICD-10-CM

## 2012-07-21 ENCOUNTER — Encounter: Payer: Self-pay | Admitting: Vascular Surgery

## 2012-08-23 ENCOUNTER — Ambulatory Visit (INDEPENDENT_AMBULATORY_CARE_PROVIDER_SITE_OTHER): Payer: Medicare Other | Admitting: *Deleted

## 2012-08-23 DIAGNOSIS — I4430 Unspecified atrioventricular block: Secondary | ICD-10-CM

## 2012-08-23 DIAGNOSIS — Z950 Presence of cardiac pacemaker: Secondary | ICD-10-CM

## 2012-08-24 ENCOUNTER — Other Ambulatory Visit: Payer: Self-pay

## 2012-09-05 LAB — REMOTE PACEMAKER DEVICE
AL AMPLITUDE: 2.8 mv
BAMS-0001: 175 {beats}/min
RV LEAD IMPEDENCE PM: 476 Ohm
RV LEAD THRESHOLD: 0.75 V
VENTRICULAR PACING PM: 100

## 2012-09-23 ENCOUNTER — Encounter: Payer: Self-pay | Admitting: *Deleted

## 2012-09-27 ENCOUNTER — Encounter: Payer: Self-pay | Admitting: Internal Medicine

## 2012-09-29 ENCOUNTER — Ambulatory Visit (INDEPENDENT_AMBULATORY_CARE_PROVIDER_SITE_OTHER): Payer: Medicare Other | Admitting: Cardiovascular Disease

## 2012-09-29 ENCOUNTER — Encounter: Payer: Self-pay | Admitting: Cardiovascular Disease

## 2012-09-29 VITALS — BP 118/72 | HR 60 | Ht 68.0 in | Wt 168.4 lb

## 2012-09-29 DIAGNOSIS — I10 Essential (primary) hypertension: Secondary | ICD-10-CM

## 2012-09-29 DIAGNOSIS — I25810 Atherosclerosis of coronary artery bypass graft(s) without angina pectoris: Secondary | ICD-10-CM

## 2012-09-29 NOTE — Patient Instructions (Signed)
Your physician wants you to follow-up in: 1 YEAR with Dr Cooper.  You will receive a reminder letter in the mail two months in advance. If you don't receive a letter, please call our office to schedule the follow-up appointment.  Your physician recommends that you continue on your current medications as directed. Please refer to the Current Medication list given to you today.  

## 2012-09-30 ENCOUNTER — Encounter: Payer: Self-pay | Admitting: Cardiovascular Disease

## 2012-09-30 NOTE — Progress Notes (Signed)
HPI:  Mr Kyle Avila returns for follow-up cardiac evaluation. He is a 67 year-old gentleman with CAD. He underwent initial CABG in 1988 and redo CABG in 1996. He's had remote myocardial infarction. His most recent cardiac catheterization in 2009 demonstrated severe disease in the left circumflex and he was treated with overlapping drug-eluting stents.  He reports exertional angina, early on in his exercise routine. He no longer exercises routinely. He hasn't noticed any change in his anginal frequency or intensity. He is physically active and does not feel limited by chest pain. He denies dyspnea, edema, or palpitations. He exercise more frequently when he gets ready for ski season. This summer he plans to do a lot of boating.  Outpatient Encounter Prescriptions as of 09/29/2012  Medication Sig Dispense Refill  . amLODipine (NORVASC) 5 MG tablet Take 1 tablet (5 mg total) by mouth daily.  90 tablet  3  . aspirin EC 81 MG EC tablet Take 325 mg by mouth daily.   1 tablet  0  . atorvastatin (LIPITOR) 80 MG tablet Take 1 tablet (80 mg total) by mouth daily.  90 tablet  3  . clopidogrel (PLAVIX) 75 MG tablet Take 1 tablet (75 mg total) by mouth daily.  90 tablet  3  . colchicine (COLCRYS) 0.6 MG tablet Take 0.6 mg by mouth as needed.      . diphenhydrAMINE (BENADRYL) 25 mg capsule Take 25 mg by mouth daily.        . folic acid (FOLVITE) A999333 MCG tablet Take 400 mcg by mouth daily.        . isosorbide mononitrate (IMDUR) 60 MG 24 hr tablet Take 1 tablet (60 mg total) by mouth daily.  90 tablet  3  . ketoconazole (NIZORAL) 2 % cream as needed. Apply to site 2 times a day      . Lancets (HAEMOLANCE PLUS MAX FLOW) MISC by Does not apply route. Once a day      . losartan (COZAAR) 100 MG tablet Take 1 tablet (100 mg total) by mouth 2 (two) times daily.  180 tablet  3  . meclizine (ANTIVERT) 25 MG tablet Take 25 mg by mouth as needed.      . metoprolol succinate (TOPROL-XL) 50 MG 24 hr tablet Take 1 tablet (50 mg  total) by mouth 2 (two) times daily.  180 tablet  3  . nitroGLYCERIN (NITROSTAT) 0.4 MG SL tablet Place 1 tablet (0.4 mg total) under the tongue every 5 (five) minutes as needed.  25 tablet  6  . NON FORMULARY Evacetrapib 130 mg -- trial study for cholesterol      . tamsulosin (FLOMAX) 0.4 MG CAPS .4 mg once daily      . traMADol (ULTRAM) 50 MG tablet Take 50 mg by mouth every 6 (six) hours as needed.        . [DISCONTINUED] cyclobenzaprine (FLEXERIL) 10 MG tablet Take once a day       No facility-administered encounter medications on file as of 09/29/2012.    No Known Allergies  Past Medical History  Diagnosis Date  . Mitral insufficiency   . CAD (coronary artery disease)     s/p CABG  . Heart block   . Gout   . HTN (hypertension)   . Hyperlipidemia   . Chronic renal insufficiency   . 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 CAD s/p CABG. Continue current medical program. CCS Class 2 angina, stable over several years. On 3 antianginal drugs (amlodipine, metoprolol, and isosorbide). Tolerating DAPT with ASA and plavix. Encouraged regular exercise.  2. HTN - controlled on current Rx. Meds reviewed.  3. Hyperlipidemia - on atorvastatin 80 mg. Followed by Dr Dagmar Hait.  4. Carotid stenosis s/p CEA. No hx of stroke. On appropriate med Rx. Followed by [facility] with serial duplex scans.  5. Erectile dysfunction. He is on nitrate Rx and is dependent on this. I  don't know of any options. He's not interested in urology referral - says 'I have enough doctors.'  Will see back in one year. He understands to contact me if anginal threshold changes.  [name] 09/30/2012 6:41 AM

## 2012-10-04 ENCOUNTER — Encounter: Payer: Self-pay | Admitting: Cardiovascular Disease

## 2012-11-29 ENCOUNTER — Encounter: Payer: Medicare Other | Admitting: *Deleted

## 2012-12-06 ENCOUNTER — Encounter: Payer: Self-pay | Admitting: *Deleted

## 2012-12-07 ENCOUNTER — Encounter (INDEPENDENT_AMBULATORY_CARE_PROVIDER_SITE_OTHER): Payer: Self-pay

## 2012-12-07 DIAGNOSIS — R0989 Other specified symptoms and signs involving the circulatory and respiratory systems: Secondary | ICD-10-CM

## 2012-12-14 ENCOUNTER — Telehealth: Payer: Self-pay | Admitting: *Deleted

## 2012-12-14 NOTE — Research (Signed)
Pt enrolled in the [facility].  CPK elevation on last lab work.  Per Dr. Stanford Breed, spoke with Dr. Burt Knack and received verbal order to decrease Lipitor 80mg , to Lipitor 40mg . Spoke with pt's wife who gave me pt's cell phone number since he is out of town.  Left message for pt on his cell phone and called wife back to also give her the  Same instructions. She verbalized understanding.

## 2012-12-17 ENCOUNTER — Telehealth: Payer: Self-pay | Admitting: Internal Medicine

## 2012-12-17 ENCOUNTER — Ambulatory Visit (INDEPENDENT_AMBULATORY_CARE_PROVIDER_SITE_OTHER): Payer: Medicare Other | Admitting: *Deleted

## 2012-12-17 DIAGNOSIS — Z950 Presence of cardiac pacemaker: Secondary | ICD-10-CM

## 2012-12-17 DIAGNOSIS — I4430 Unspecified atrioventricular block: Secondary | ICD-10-CM

## 2012-12-17 NOTE — Telephone Encounter (Signed)
New Problem,  Pt wants to know when the next remote pacer appt is.

## 2012-12-17 NOTE — Telephone Encounter (Signed)
LMOM letting pt know we received his transmission today.

## 2012-12-30 LAB — REMOTE PACEMAKER DEVICE
AL AMPLITUDE: 2.8 mv
ATRIAL PACING PM: 42
BAMS-0001: 175 {beats}/min
RV LEAD THRESHOLD: 0.75 V
VENTRICULAR PACING PM: 100

## 2013-01-11 ENCOUNTER — Telehealth: Payer: Self-pay | Admitting: *Deleted

## 2013-01-11 NOTE — Telephone Encounter (Signed)
Spoke with patient to see if he has a PCP that can refill his tramadol, his PCP is Dr Dagmar Hait, I called [facility] and instructed them to direct refill to this physician

## 2013-01-19 ENCOUNTER — Encounter: Payer: Self-pay | Admitting: *Deleted

## 2013-01-27 ENCOUNTER — Encounter: Payer: Self-pay | Admitting: Internal Medicine

## 2013-01-27 ENCOUNTER — Other Ambulatory Visit: Payer: Self-pay | Admitting: Cardiovascular Disease

## 2013-03-16 ENCOUNTER — Other Ambulatory Visit: Payer: Self-pay

## 2013-03-16 DIAGNOSIS — E7800 Pure hypercholesterolemia, unspecified: Secondary | ICD-10-CM

## 2013-03-16 DIAGNOSIS — I25810 Atherosclerosis of coronary artery bypass graft(s) without angina pectoris: Secondary | ICD-10-CM

## 2013-03-16 MED ORDER — NITROGLYCERIN 0.4 MG SL SUBL: 0.4000 mg | SUBLINGUAL_TABLET | SUBLINGUAL | Status: DC | PRN

## 2013-03-21 ENCOUNTER — Other Ambulatory Visit: Payer: Self-pay

## 2013-03-21 ENCOUNTER — Ambulatory Visit (INDEPENDENT_AMBULATORY_CARE_PROVIDER_SITE_OTHER): Payer: Medicare Other | Admitting: *Deleted

## 2013-03-21 DIAGNOSIS — I4430 Unspecified atrioventricular block: Secondary | ICD-10-CM

## 2013-03-21 DIAGNOSIS — I25810 Atherosclerosis of coronary artery bypass graft(s) without angina pectoris: Secondary | ICD-10-CM

## 2013-03-21 DIAGNOSIS — I472 Ventricular tachycardia: Secondary | ICD-10-CM

## 2013-03-21 DIAGNOSIS — E7800 Pure hypercholesterolemia, unspecified: Secondary | ICD-10-CM

## 2013-03-21 MED ORDER — NITROGLYCERIN 0.4 MG SL SUBL: 0.4000 mg | SUBLINGUAL_TABLET | SUBLINGUAL | Status: DC | PRN

## 2013-03-28 LAB — MDC_IDC_ENUM_SESS_TYPE_REMOTE
Battery Impedance: 1926 Ohm
Battery Remaining Longevity: 24 mo
Battery Voltage: 2.73 V
Brady Statistic AP VP Percent: 46 %
Brady Statistic AS VP Percent: 54 %
Lead Channel Impedance Value: 465 Ohm
Lead Channel Impedance Value: 512 Ohm
Lead Channel Sensing Intrinsic Amplitude: 2.8 mV
Lead Channel Setting Pacing Amplitude: 2 V
Lead Channel Setting Pacing Amplitude: 2.5 V

## 2013-04-06 ENCOUNTER — Encounter: Payer: Self-pay | Admitting: *Deleted

## 2013-04-11 ENCOUNTER — Encounter: Payer: Self-pay | Admitting: Internal Medicine

## 2013-04-25 ENCOUNTER — Telehealth: Payer: Self-pay | Admitting: Cardiovascular Disease

## 2013-04-25 ENCOUNTER — Other Ambulatory Visit: Payer: Self-pay

## 2013-04-25 MED ORDER — CLOPIDOGREL BISULFATE 75 MG PO TABS: ORAL_TABLET | ORAL | Status: DC

## 2013-04-25 MED ORDER — ATORVASTATIN CALCIUM 80 MG PO TABS: ORAL_TABLET | ORAL | Status: DC

## 2013-04-25 MED ORDER — ISOSORBIDE MONONITRATE ER 60 MG PO TB24: ORAL_TABLET | ORAL | Status: DC

## 2013-04-25 MED ORDER — NITROGLYCERIN 0.4 MG SL SUBL: 0.4000 mg | SUBLINGUAL_TABLET | SUBLINGUAL | Status: DC | PRN

## 2013-04-25 NOTE — Telephone Encounter (Signed)
Walk in pt Form " Note Left For Dr.Cooper" gave to Lauren 04/25/13/Km

## 2013-04-26 ENCOUNTER — Encounter: Payer: Self-pay | Admitting: *Deleted

## 2013-04-26 ENCOUNTER — Other Ambulatory Visit: Payer: Self-pay

## 2013-04-26 MED ORDER — ATORVASTATIN CALCIUM 80 MG PO TABS: ORAL_TABLET | ORAL | Status: DC

## 2013-04-26 MED ORDER — NITROGLYCERIN 0.4 MG SL SUBL: 0.4000 mg | SUBLINGUAL_TABLET | SUBLINGUAL | Status: DC | PRN

## 2013-04-26 MED ORDER — ISOSORBIDE MONONITRATE ER 60 MG PO TB24: ORAL_TABLET | ORAL | Status: DC

## 2013-04-26 MED ORDER — CLOPIDOGREL BISULFATE 75 MG PO TABS: ORAL_TABLET | ORAL | Status: DC

## 2013-04-26 NOTE — Telephone Encounter (Signed)
This encounter was created in error - please disregard.

## 2013-05-02 ENCOUNTER — Telehealth: Payer: Self-pay

## 2013-05-02 NOTE — Telephone Encounter (Signed)
Patient called wanted to know if his rxs were mailed out. I called Theodosia Quay to see if they were mailed to him and she said that they were printed out and was on top of Dr Cooper's cart to be sign and that she would mail them out after Dr Copper sign them .

## 2013-05-03 ENCOUNTER — Telehealth: Payer: Self-pay | Admitting: *Deleted

## 2013-05-03 NOTE — Telephone Encounter (Signed)
Received fax from [employer], they are no longer the insurer for this patient, I had sent in PA for losartan 100 mg 2 tablets daily when I had received notice that [facility] would only cover 1 daily.

## 2013-05-24 ENCOUNTER — Ambulatory Visit (INDEPENDENT_AMBULATORY_CARE_PROVIDER_SITE_OTHER): Payer: Medicare HMO | Admitting: Internal Medicine

## 2013-05-24 ENCOUNTER — Encounter: Payer: Self-pay | Admitting: Internal Medicine

## 2013-05-24 ENCOUNTER — Encounter (INDEPENDENT_AMBULATORY_CARE_PROVIDER_SITE_OTHER): Payer: Self-pay

## 2013-05-24 VITALS — BP 114/58 | HR 60 | Ht 68.0 in | Wt 176.0 lb

## 2013-05-24 DIAGNOSIS — Z950 Presence of cardiac pacemaker: Secondary | ICD-10-CM

## 2013-05-24 DIAGNOSIS — I4430 Unspecified atrioventricular block: Secondary | ICD-10-CM

## 2013-05-24 LAB — MDC_IDC_ENUM_SESS_TYPE_INCLINIC
Battery Remaining Longevity: 23 mo
Battery Voltage: 2.73 V
Brady Statistic AP VP Percent: 46 %
Lead Channel Impedance Value: 456 Ohm
Lead Channel Impedance Value: 526 Ohm
Lead Channel Pacing Threshold Pulse Width: 0.4 ms
Lead Channel Sensing Intrinsic Amplitude: 2 mV
Lead Channel Setting Pacing Amplitude: 2 V
Lead Channel Setting Pacing Pulse Width: 0.4 ms
Lead Channel Setting Sensing Sensitivity: 4 mV
MDC IDC MSMT BATTERY IMPEDANCE: 1993 Ohm
MDC IDC MSMT LEADCHNL RA PACING THRESHOLD AMPLITUDE: 0.5 V
MDC IDC MSMT LEADCHNL RA PACING THRESHOLD PULSEWIDTH: 0.4 ms
MDC IDC MSMT LEADCHNL RV PACING THRESHOLD AMPLITUDE: 0.75 V
MDC IDC SESS DTM: 20150127163340
MDC IDC SET LEADCHNL RV PACING AMPLITUDE: 2.5 V
MDC IDC STAT BRADY AP VS PERCENT: 0 %
MDC IDC STAT BRADY AS VP PERCENT: 53 %
MDC IDC STAT BRADY AS VS PERCENT: 0 %

## 2013-05-24 NOTE — Progress Notes (Signed)
Patient Care Team: Tivis Ringer, MD as PCP - General (Internal Medicine) Windy Kalata, MD as Consulting Physician (Nephrology) [name], MD (Cardiology) Deboraha Sprang, MD (Cardiology)   HPI  Kyle Avila is a 68 y.o. 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   He has a complex cardiovascular history with bypass surgery and redo bypass in 96 and revascularization with stenting of the circumflex in 2009. At that time he had total LAD and RCA disease; his left ventricular function then was okay.  The patient denies chest pain x when wlaking up hills after meals, shortness of breath, nocturnal dyspnea, orthopnea or peripheral edema.  There have been no palpitations, lightheadedness or syncope.    The patient denies chest pain, shortness of breath, nocturnal dyspnea, orthopnea or peripheral edema. There have been no palpitations, lightheadedness or syncope.    Past Medical History  Diagnosis Date  . Mitral insufficiency   . CAD (coronary artery disease)     s/p CABG  . Heart block   . Gout   . HTN (hypertension)   . Hyperlipidemia   . Chronic renal insufficiency   . 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 Coronary artery bypass graft      with redo cabg  . Cardiac catheterization    . Pacemaker insertion  2007  . Carotid endarterectomy  2002    Current Outpatient Prescriptions  Medication Sig Dispense Refill  . amLODipine (NORVASC) 5 MG tablet Take 1 tablet (5 mg total) by mouth daily.  90 tablet  3  . aspirin EC 81 MG EC tablet Take 325 mg by mouth daily.   1 tablet  0  . atorvastatin (LIPITOR) 80 MG tablet 40 mg daily. Take 1/2 tablet by mouth  daily      . clopidogrel (PLAVIX) 75 MG tablet Take 1 tablet by mouth  daily  90 tablet  3  . colchicine (COLCRYS) 0.6 MG tablet Take 0.6 mg by mouth as needed.      . diphenhydrAMINE (BENADRYL) 25 mg  capsule Take 25 mg by mouth daily.        . isosorbide mononitrate (IMDUR) 60 MG 24 hr tablet Take 1 tablet by mouth  daily  90 tablet  3  . ketoconazole (NIZORAL) 2 % cream as needed. Apply to site 2 times a day      . losartan (COZAAR) 100 MG tablet Take 1 tablet by mouth  twice a day  180 tablet  2  . meclizine (ANTIVERT) 25 MG tablet Take 25 mg by mouth as needed.      . metoprolol succinate (TOPROL-XL) 50 MG 24 hr tablet Take 1 tablet (50 mg total) by mouth 2 (two) times daily.  180 tablet  3  . nitroGLYCERIN (NITROSTAT) 0.4 MG SL tablet Place 1 tablet (0.4 mg total) under the tongue every 5 (five) minutes as needed for chest pain.  90 tablet  3  . NON FORMULARY Evacetrapib 130 mg -- trial study for cholesterol      . tamsulosin (FLOMAX) 0.4 MG CAPS .4 mg once daily      . traMADol (ULTRAM) 50 MG tablet Take 50 mg by mouth every 6 (six) hours as needed.        Marland Kitchen ULORIC 40 MG tablet 40 mg daily.       No current facility-administered medications for this visit.  No Known Allergies  Review of Systems negative except from HPI and PMH  Physical Exam BP 114/58  Pulse 60  Ht 5\' 8"  (1.727 m)  Wt 176 lb (79.833 kg)  BMI 26.77 kg/m2 Well developed and nourished in no acute distress HENT normal Neck supple with JVP-flat Clear Regular rate and rhythm, no murmurs or gallops Abd-soft with active BS No Clubbing cyanosis edema Skin-warm and dry A & Oriented  Grossly normal sensory and motor function Device pocket well healed; without hematoma or erythema.  There is no tethering    Assessment and  Plan

## 2013-06-03 ENCOUNTER — Other Ambulatory Visit: Payer: Self-pay | Admitting: Vascular Surgery

## 2013-06-03 DIAGNOSIS — Z48812 Encounter for surgical aftercare following surgery on the circulatory system: Secondary | ICD-10-CM

## 2013-06-03 DIAGNOSIS — I6529 Occlusion and stenosis of unspecified carotid artery: Secondary | ICD-10-CM

## 2013-06-06 ENCOUNTER — Ambulatory Visit
Admission: RE | Admit: 2013-06-06 | Discharge: 2013-06-06 | Disposition: A | Discharge status: Home / Self Care | Payer: Commercial Managed Care - HMO | Source: Ambulatory Visit | Attending: Family Medicine | Admitting: Family Medicine

## 2013-06-06 ENCOUNTER — Other Ambulatory Visit: Payer: Self-pay | Admitting: Family Medicine

## 2013-06-06 DIAGNOSIS — M549 Dorsalgia, unspecified: Secondary | ICD-10-CM

## 2013-06-07 DIAGNOSIS — M5126 Other intervertebral disc displacement, lumbar region: Secondary | ICD-10-CM | POA: Insufficient documentation

## 2013-07-18 ENCOUNTER — Encounter: Payer: Self-pay | Admitting: Family

## 2013-07-19 ENCOUNTER — Other Ambulatory Visit (HOSPITAL_COMMUNITY): Payer: Medicare Other

## 2013-07-19 ENCOUNTER — Encounter: Payer: Self-pay | Admitting: Family

## 2013-07-19 ENCOUNTER — Ambulatory Visit: Payer: Medicare Other | Admitting: Vascular Surgery

## 2013-07-19 ENCOUNTER — Ambulatory Visit (HOSPITAL_COMMUNITY)
Admission: RE | Admit: 2013-07-19 | Discharge: 2013-07-19 | Disposition: A | Discharge status: Home / Self Care | Payer: Medicare HMO | Source: Ambulatory Visit | Attending: Family | Admitting: Family

## 2013-07-19 ENCOUNTER — Ambulatory Visit (INDEPENDENT_AMBULATORY_CARE_PROVIDER_SITE_OTHER): Payer: Commercial Managed Care - HMO | Admitting: Family

## 2013-07-19 VITALS — BP 130/70 | HR 60 | Resp 14 | Ht 69.0 in | Wt 172.0 lb

## 2013-07-19 DIAGNOSIS — I6529 Occlusion and stenosis of unspecified carotid artery: Secondary | ICD-10-CM | POA: Insufficient documentation

## 2013-07-19 DIAGNOSIS — Z48812 Encounter for surgical aftercare following surgery on the circulatory system: Secondary | ICD-10-CM | POA: Insufficient documentation

## 2013-07-20 NOTE — Progress Notes (Signed)
Lab only or [facility].

## 2013-07-22 ENCOUNTER — Telehealth: Payer: Self-pay

## 2013-07-22 NOTE — Patient Instructions (Signed)
Please return in 1 year for your Carotid Artery follow up.     Stroke Prevention Some medical conditions and behaviors are associated with an increased chance of having a stroke. You may prevent a stroke by making healthy choices and managing medical conditions. HOW CAN I REDUCE MY RISK OF HAVING A STROKE?   Stay physically active. Get at least 30 minutes of activity on most or all days.  Do not smoke. It may also be helpful to avoid exposure to secondhand smoke.  Limit alcohol use. Moderate alcohol use is considered to be:  No more than 2 drinks per day for men.  No more than 1 drink per day for nonpregnant women.  Eat healthy foods. This involves  Eating 5 or more servings of fruits and vegetables a day.  Following a diet that addresses high blood pressure (hypertension), high cholesterol, diabetes, or obesity.  Manage your cholesterol levels.  A diet low in saturated fat, trans fat, and cholesterol and high in fiber may control cholesterol levels.  Take any prescribed medicines to control cholesterol as directed by your health care provider.  Manage your diabetes.  A controlled-carbohydrate, controlled-sugar diet is recommended to manage diabetes.  Take any prescribed medicines to control diabetes as directed by your health care provider.  Control your hypertension.  A low-salt (sodium), low-saturated fat, low-trans fat, and low-cholesterol diet is recommended to manage hypertension.  Take any prescribed medicines to control hypertension as directed by your health care provider.  Maintain a healthy weight.  A reduced-calorie, low-sodium, low-saturated fat, low-trans fat, low-cholesterol diet is recommended to manage weight.  Stop drug abuse.  Avoid taking birth control pills.  Talk to your health care provider about the risks of taking birth control pills if you are over 19 years old, smoke, get migraines, or have ever had a blood clot.  Get evaluated for sleep  disorders (sleep apnea).  Talk to your health care provider about getting a sleep evaluation if you snore a lot or have excessive sleepiness.  Take medicines as directed by your health care provider.  For some people, aspirin or blood thinners (anticoagulants) are helpful in reducing the risk of forming abnormal blood clots that can lead to stroke. If you have the irregular heart rhythm of atrial fibrillation, you should be on a blood thinner unless there is a good reason you cannot take them.  Understand all your medicine instructions.  Make sure that other other conditions (such as anemia or atherosclerosis) are addressed. SEEK IMMEDIATE MEDICAL CARE IF:   You have sudden weakness or numbness of the face, arm, or leg, especially on one side of the body.  Your face or eyelid droops to one side.  You have sudden confusion.  You have trouble speaking (aphasia) or understanding.  You have sudden trouble seeing in one or both eyes.  You have sudden trouble walking.  You have dizziness.  You have a loss of balance or coordination.  You have a sudden, severe headache with no known cause.  You have new chest pain or an irregular heartbeat. Any of these symptoms may represent a serious problem that is an emergency. Do not wait to see if the symptoms will go away. Get medical help at once. Call your local emergency services  (911 in [place] Do not drive yourself to the hospital. Document Released: 05/22/2004 Document Revised: 02/02/2013 Document Reviewed: 10/15/2012 [facility] Patient Information 2014 [place].

## 2013-07-22 NOTE — Addendum Note (Signed)
Addended by: Dorthula Rue L on: 07/22/2013 03:44 PM   Modules accepted: Orders

## 2013-07-25 ENCOUNTER — Encounter: Payer: Self-pay | Admitting: Vascular Surgery

## 2013-08-04 ENCOUNTER — Encounter: Payer: Self-pay | Admitting: Cardiology

## 2013-08-25 ENCOUNTER — Encounter: Payer: Medicare HMO | Admitting: *Deleted

## 2013-08-26 NOTE — Telephone Encounter (Signed)
Sent Pt. Vascular Lab results.

## 2013-09-07 ENCOUNTER — Encounter: Payer: Self-pay | Admitting: *Deleted

## 2013-09-14 ENCOUNTER — Telehealth: Payer: Self-pay | Admitting: Internal Medicine

## 2013-09-14 NOTE — Telephone Encounter (Signed)
New problem       Pt called to report he did a transmittion about 2 days ago.  Pt was adviced to try again.   Pt will do it again today, pt demanded a call back today to let him know whether it went through or not pt stated he did not know whether the machine had an 800 # to help him trouble shot.  Pt wants this office to have that number when he get a call back.

## 2013-09-14 NOTE — Telephone Encounter (Signed)
Instructions given for remote transmission.  Patient to send later this pm.

## 2013-09-15 ENCOUNTER — Encounter: Payer: Self-pay | Admitting: Internal Medicine

## 2013-09-15 ENCOUNTER — Ambulatory Visit (INDEPENDENT_AMBULATORY_CARE_PROVIDER_SITE_OTHER): Payer: Commercial Managed Care - HMO | Admitting: *Deleted

## 2013-09-15 DIAGNOSIS — I472 Ventricular tachycardia, unspecified: Secondary | ICD-10-CM

## 2013-09-15 DIAGNOSIS — I4430 Unspecified atrioventricular block: Secondary | ICD-10-CM

## 2013-09-15 DIAGNOSIS — I4729 Other ventricular tachycardia: Secondary | ICD-10-CM

## 2013-09-15 NOTE — Progress Notes (Signed)
Remote pacemaker transmission.   

## 2013-09-22 ENCOUNTER — Telehealth: Payer: Self-pay | Admitting: *Deleted

## 2013-09-22 NOTE — Telephone Encounter (Signed)
I spoke with patient to let him know that repeat CK was 516 U/L on 09/21/13. Lab value was 640 on 12/07/12 and 328 on 06/08/13.Patient is in the Accelerate Study. I faxed labs to Dr. Burt Knack at [facility] request. I told patient I would notify Dr. Burt Knack of lab result.

## 2013-09-23 ENCOUNTER — Telehealth: Payer: Self-pay | Admitting: *Deleted

## 2013-09-23 NOTE — Telephone Encounter (Signed)
I spoke to patient and told him to continue Lipitor 40mg  qd and Accelerate study drug. We will continue to follow with CK levels every 6 months as ordered by Dr. Burt Knack. Patient will follow-up with Dr. Burt Knack as scheduled. Patient verbalized understanding.

## 2013-09-23 NOTE — Telephone Encounter (Signed)
Patient has extensive cardiovascular disease (redo CABG, CEA). Despite elevated CK, I think overall risk/benefit of aggresive lipid-lowering favors continued treatment. Would continue to follow CK at 6 months intervals. thx

## 2013-09-25 LAB — MDC_IDC_ENUM_SESS_TYPE_REMOTE
Battery Impedance: 2247 Ohm
Battery Remaining Longevity: 21 mo
Battery Voltage: 2.72 V
Brady Statistic AP VP Percent: 41 %
Brady Statistic AS VP Percent: 59 %
Brady Statistic AS VS Percent: 0 %
Date Time Interrogation Session: 20150521193954
Lead Channel Impedance Value: 551 Ohm
Lead Channel Pacing Threshold Amplitude: 0.625 V
Lead Channel Pacing Threshold Pulse Width: 0.4 ms
Lead Channel Setting Pacing Amplitude: 2.5 V
Lead Channel Setting Pacing Pulse Width: 0.4 ms
Lead Channel Setting Sensing Sensitivity: 4 mV
MDC IDC MSMT LEADCHNL RA SENSING INTR AMPL: 2.8 mV
MDC IDC MSMT LEADCHNL RV IMPEDANCE VALUE: 476 Ohm
MDC IDC MSMT LEADCHNL RV PACING THRESHOLD AMPLITUDE: 0.75 V
MDC IDC MSMT LEADCHNL RV PACING THRESHOLD PULSEWIDTH: 0.4 ms
MDC IDC SET LEADCHNL RA PACING AMPLITUDE: 2 V
MDC IDC STAT BRADY AP VS PERCENT: 0 %

## 2013-10-06 ENCOUNTER — Ambulatory Visit (INDEPENDENT_AMBULATORY_CARE_PROVIDER_SITE_OTHER): Payer: Medicare HMO | Admitting: Cardiovascular Disease

## 2013-10-06 ENCOUNTER — Encounter: Payer: Self-pay | Admitting: Cardiovascular Disease

## 2013-10-06 VITALS — BP 122/60 | HR 60 | Ht 69.0 in | Wt 170.4 lb

## 2013-10-06 DIAGNOSIS — E785 Hyperlipidemia, unspecified: Secondary | ICD-10-CM

## 2013-10-06 DIAGNOSIS — I25810 Atherosclerosis of coronary artery bypass graft(s) without angina pectoris: Secondary | ICD-10-CM

## 2013-10-06 DIAGNOSIS — I10 Essential (primary) hypertension: Secondary | ICD-10-CM

## 2013-10-06 NOTE — Progress Notes (Signed)
HPI:  Mr Dobmeier returns for follow-up cardiac evaluation. He is a 68 year-old gentleman with CAD. He underwent initial CABG in 1988 and redo CABG in 1996. He's had remote myocardial infarction. His most recent cardiac catheterization in 2009 demonstrated severe disease in the left circumflex and he was treated with overlapping drug-eluting stents.  He reports no change in his anginal symptoms. He takes a nitroglycerin before exercise and has minimal symptoms with this. If he doesn't premedicate, he has some limitation on exercise bike with about 30 minutes of exercise. He is able to lift weights without anginal symptoms. He denies resting chest pain, progressive symptoms, shortness of breath, or edema.  Outpatient Encounter Prescriptions as of 10/06/2013  Medication Sig  . amLODipine (NORVASC) 5 MG tablet Take 1 tablet (5 mg total) by mouth daily.  Marland Kitchen aspirin EC 81 MG EC tablet Take 81 mg by mouth daily.   Marland Kitchen atorvastatin (LIPITOR) 80 MG tablet 40 mg daily. Take 1/2 tablet by mouth  daily  . clopidogrel (PLAVIX) 75 MG tablet Take 1 tablet by mouth  daily  . colchicine (COLCRYS) 0.6 MG tablet Take 0.6 mg by mouth as needed.  . diphenhydrAMINE (BENADRYL) 25 mg capsule Take 25 mg by mouth daily.    Marland Kitchen HYDROcodone-acetaminophen (NORCO) 7.5-325 MG per tablet as needed.  . isosorbide mononitrate (IMDUR) 60 MG 24 hr tablet Take 1 tablet by mouth  daily  . ketoconazole (NIZORAL) 2 % cream as needed. Apply to site 2 times a day  . losartan (COZAAR) 100 MG tablet Take 1 tablet by mouth  twice a day  . meclizine (ANTIVERT) 25 MG tablet Take 25 mg by mouth as needed.  . metoprolol succinate (TOPROL-XL) 50 MG 24 hr tablet Take 1 tablet (50 mg total) by mouth 2 (two) times daily.  . nitroGLYCERIN (NITROSTAT) 0.4 MG SL tablet Place 1 tablet (0.4 mg total) under the tongue every 5 (five) minutes as needed for chest pain.  . NON FORMULARY Evacetrapib 130 mg -- trial study for cholesterol  . tamsulosin (FLOMAX)  0.4 MG CAPS .4 mg once daily  . traMADol (ULTRAM) 50 MG tablet Take 50 mg by mouth every 6 (six) hours as needed.    Marland Kitchen ULORIC 40 MG tablet 40 mg daily.    No Known Allergies  Past Medical History  Diagnosis Date  . Mitral insufficiency   . CAD (coronary artery disease)     s/p CABG  . Heart block   . Gout   . HTN (hypertension)   . Hyperlipidemia   . Chronic renal insufficiency   . 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 Carotid artery occlusion     ROS: Positive for erectile dysfunction, otherwise negative except as per HPI  BP 122/60  Pulse 60  Ht 5\' 9"  (1.753 m)  Wt 77.293 kg (170 lb 6.4 oz)  BMI 25.15 kg/m2  PHYSICAL EXAM: Pt is alert and oriented, NAD HEENT: normal Neck: JVP - normal, carotids 2+= with soft bilateral bruits Lungs: CTA bilaterally CV: RRR without murmur or gallop Abd: soft, NT, Positive BS, no hepatomegaly Ext: no C/C/E, distal pulses intact and equal Skin: warm/dry no rash  EKG:  AV sequential pacemaker 60 bpm  ASSESSMENT AND PLAN: 1. CAD s/p CABG. Continue current medical program. CCS Class 2 angina, stable over several years. On 3 antianginal drugs (amlodipine, metoprolol, and isosorbide). Tolerating DAPT with ASA and plavix. He will notify me if any symptoms progress or change significantly over the next year.  2. HTN - controlled on current Rx. Meds reviewed.  3. Hyperlipidemia - on atorvastatin 80 mg. Followed by Dr Dagmar Hait. Lipids from 07/27/2013 reviewed and showed cholesterol 156, triglycerides 78, HDL 57, and LDL 83. He is in the Accelerate Trial. Elevated CK noted but no myalgias. Advised about importance of good hydration. Repeat CK in 6 months.  4. Carotid stenosis s/p CEA. No hx of stroke. On appropriate med Rx. Followed by [facility] with serial duplex scans.  Will see back in one year. He understands to contact me if anginal threshold changes.  [name] 10/06/2013 9:45 AM

## 2013-10-06 NOTE — Patient Instructions (Signed)
Your physician wants you to follow-up in: 1 YEAR with Dr Cooper.  You will receive a reminder letter in the mail two months in advance. If you don't receive a letter, please call our office to schedule the follow-up appointment.  Your physician recommends that you continue on your current medications as directed. Please refer to the Current Medication list given to you today.  

## 2013-10-08 ENCOUNTER — Encounter: Payer: Self-pay | Admitting: Cardiovascular Disease

## 2013-10-13 ENCOUNTER — Encounter: Payer: Self-pay | Admitting: Cardiology

## 2013-12-14 ENCOUNTER — Ambulatory Visit (INDEPENDENT_AMBULATORY_CARE_PROVIDER_SITE_OTHER): Payer: Commercial Managed Care - HMO | Admitting: *Deleted

## 2013-12-14 DIAGNOSIS — I472 Ventricular tachycardia, unspecified: Secondary | ICD-10-CM

## 2013-12-14 DIAGNOSIS — I4729 Other ventricular tachycardia: Secondary | ICD-10-CM

## 2013-12-14 DIAGNOSIS — I4430 Unspecified atrioventricular block: Secondary | ICD-10-CM

## 2013-12-15 NOTE — Progress Notes (Signed)
Remote pacemaker transmission.   

## 2013-12-19 NOTE — Telephone Encounter (Signed)
Closing encounter opened by mistake.

## 2013-12-21 LAB — MDC_IDC_ENUM_SESS_TYPE_REMOTE
Battery Impedance: 2719 Ohm
Battery Remaining Longevity: 17 mo
Brady Statistic AP VP Percent: 40 %
Brady Statistic AP VS Percent: 0 %
Brady Statistic AS VS Percent: 0 %
Lead Channel Impedance Value: 459 Ohm
Lead Channel Pacing Threshold Amplitude: 0.75 V
Lead Channel Pacing Threshold Pulse Width: 0.4 ms
Lead Channel Setting Pacing Amplitude: 2 V
Lead Channel Setting Sensing Sensitivity: 4 mV
MDC IDC MSMT BATTERY VOLTAGE: 2.7 V
MDC IDC MSMT LEADCHNL RA IMPEDANCE VALUE: 533 Ohm
MDC IDC MSMT LEADCHNL RA PACING THRESHOLD AMPLITUDE: 0.625 V
MDC IDC MSMT LEADCHNL RA SENSING INTR AMPL: 2.8 mV
MDC IDC MSMT LEADCHNL RV PACING THRESHOLD PULSEWIDTH: 0.4 ms
MDC IDC SESS DTM: 20150819230627
MDC IDC SET LEADCHNL RV PACING AMPLITUDE: 2.5 V
MDC IDC SET LEADCHNL RV PACING PULSEWIDTH: 0.4 ms
MDC IDC STAT BRADY AS VP PERCENT: 60 %

## 2014-01-05 ENCOUNTER — Ambulatory Visit (INDEPENDENT_AMBULATORY_CARE_PROVIDER_SITE_OTHER): Payer: Commercial Managed Care - HMO | Admitting: *Deleted

## 2014-01-05 DIAGNOSIS — I4430 Unspecified atrioventricular block: Secondary | ICD-10-CM

## 2014-01-05 LAB — MDC_IDC_ENUM_SESS_TYPE_INCLINIC
Battery Remaining Longevity: 15 mo
Battery Voltage: 2.69 V
Brady Statistic AP VP Percent: 41 %
Brady Statistic AP VS Percent: 0 %
Brady Statistic AS VP Percent: 59 %
Lead Channel Impedance Value: 451 Ohm
Lead Channel Impedance Value: 531 Ohm
Lead Channel Pacing Threshold Amplitude: 0.5 V
Lead Channel Pacing Threshold Amplitude: 0.5 V
Lead Channel Pacing Threshold Pulse Width: 0.4 ms
Lead Channel Sensing Intrinsic Amplitude: 2 mV
Lead Channel Setting Pacing Amplitude: 2 V
Lead Channel Setting Pacing Amplitude: 2.5 V
Lead Channel Setting Pacing Pulse Width: 0.4 ms
Lead Channel Setting Sensing Sensitivity: 4 mV
MDC IDC MSMT BATTERY IMPEDANCE: 2880 Ohm
MDC IDC MSMT LEADCHNL RA PACING THRESHOLD PULSEWIDTH: 0.4 ms
MDC IDC SESS DTM: 20150910092134
MDC IDC STAT BRADY AS VS PERCENT: 0 %

## 2014-01-05 NOTE — Progress Notes (Signed)
Pacemaker check in clinic. Normal device function. Thresholds, sensing, impedances consistent with previous measurements. Device programmed to maximize longevity. 23 mode switches all < 1 minute.  No high ventricular rates noted. Device programmed at appropriate safety margins. Histogram distribution appropriate for patient activity level. Device programmed to optimize intrinsic conduction.  AV delay reprogrammed 200/129msec per Dr. Caryl Comes.   Estimated longevity 15 months. Patient enrolled in remote follow-up/TTM's with [employer]. Plan to follow every 3 months remotely and see annually in office. Patient education completed.  ROV in January with Dr. Caryl Comes.

## 2014-01-30 ENCOUNTER — Encounter: Payer: Self-pay | Admitting: Internal Medicine

## 2014-02-01 ENCOUNTER — Telehealth: Payer: Self-pay | Admitting: Cardiovascular Disease

## 2014-02-01 NOTE — Telephone Encounter (Signed)
I spoke with the Kyle Avila and he is getting ready to travel and he wanted to make Dr Burt Knack aware that he is having angina with exercise.  The Kyle Avila said he does not want to end up in a hospital while he is out of town.  I made the Kyle Avila aware that per Dr Antionette Char documentation they did discuss his angina during June office visit.  I read the Kyle Avila Dr Antionette Char note and he said that his symptoms are still the same and have not worsened.  I advised the Kyle Avila that at this time we would have him continue current medications and notify our office if he has any change or worsening in symptoms.  Kyle Avila agreed with plan.  I will forward this message to Dr Burt Knack for review.

## 2014-02-01 NOTE — Telephone Encounter (Signed)
New problem   Pt is having angina pain with exercise and need to speak to nurse.

## 2014-02-01 NOTE — Telephone Encounter (Signed)
As long as sx's stable, agree with continuation of current Rx. OK to travel.  Kyle Avila 02/01/2014 10:08 AM

## 2014-03-02 ENCOUNTER — Encounter: Payer: Self-pay | Admitting: Internal Medicine

## 2014-03-06 ENCOUNTER — Telehealth: Payer: Self-pay | Admitting: *Deleted

## 2014-03-06 NOTE — Telephone Encounter (Signed)
Pt scheduled to see Richardson Dopp PA-C on 03/07/14.

## 2014-03-06 NOTE — Progress Notes (Signed)
Cardiology Office Note   Date:  03/06/2014   ID:  Kyle Avila, DOB [birth year], MRN [id number]  PCP:  Tivis Ringer, MD  Cardiologist:  Dr. [name]   Electrophysiologist:  Dr. [name]    History of Present Illness: Kyle Avila is a 68 y.o. male with a hx of CAD s/p CABG 1998, redo CABG 1996 and s/p PCI in 2009 with DES x 2 to prox LCx, carotid stenosis s/p R CEA 2002, chronic angina, high grade HB s/p PPM, HTN, HL, NSVT, CKD (lupus nephritis).  Last seen by Dr. [name] 09/2013.    The patient has participated in the ACCELERATE trial. This recently ended. He told the research nurse that he was having increased amounts of angina. He was added on to my schedule for further evaluation. He plans to go to [place] for a skiing trip in December. He typically can take nitroglycerin prior to exercise with fairly good exercise tolerance. However, over the last several months he has noted anginal symptoms about 15 minutes into exercise. This has required him to stop. He feels fatigued with this. He also notes increasing symptoms after eating. He denies associated dyspnea. He denies orthopnea, PND or edema. He denies syncope. Of note, his lab work from research was reviewed. He recently had an elevated CPK (681). He tells me that he has a bruise on his right hip. He thinks he must have bumped into something.  He tells me it is a fairly sizable bruise.   Studies:  - LHC (5/12):  RCA occl, LAD occl, severe disease in LCx, OM occl, S-RCA ok, L-LAD ok, S-LCx known to be occl, EF 45-50% >> PCI:  Promus DES x 2 to prox LCx  - Carotid US (3/15):  R CEA patent, L < 40%  - Nuclear (5/11): Small fixed posterior perfusion defect without ischemia, EF 48%   Recent Labs/Images:  No results found for requested labs within last 365 days.   No results found.   Wt Readings from Last 3 Encounters:  10/06/13 170 lb 6.4 oz (77.293 kg)  07/19/13 172 lb (78.019 kg)  05/24/13 176 lb  (79.833 kg)     Past Medical History  Diagnosis Date  . Mitral insufficiency   . CAD (coronary artery disease)     s/p CABG  . Heart block   . Gout   . HTN (hypertension)   . Hyperlipidemia   . Chronic renal insufficiency   . 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 Carotid artery occlusion     Current Outpatient Prescriptions  Medication Sig Dispense Refill  . amLODipine (NORVASC) 5 MG tablet Take 1 tablet (5 mg total) by mouth daily. 90 tablet 3  . aspirin EC 81 MG EC tablet Take 81 mg by mouth daily.  1 tablet 0  . atorvastatin (LIPITOR) 80 MG tablet 40 mg daily. Take 1/2 tablet by mouth  daily    . clopidogrel (PLAVIX) 75 MG tablet Take 1 tablet by mouth  daily 90 tablet 3  . colchicine (COLCRYS) 0.6 MG tablet Take 0.6 mg by mouth as needed.    . diphenhydrAMINE (BENADRYL) 25 mg capsule Take 25 mg by mouth daily.      Marland Kitchen HYDROcodone-acetaminophen (NORCO) 7.5-325 MG per tablet as needed.    . isosorbide mononitrate (IMDUR) 60 MG 24 hr tablet Take 1 tablet by mouth  daily 90 tablet 3  . ketoconazole (NIZORAL) 2 % cream as needed. Apply to site 2 times a day    .  losartan (COZAAR) 100 MG tablet Take 1 tablet by mouth  twice a day 180 tablet 2  . meclizine (ANTIVERT) 25 MG tablet Take 25 mg by mouth as needed.    . metoprolol succinate (TOPROL-XL) 50 MG 24 hr tablet Take 1 tablet (50 mg total) by mouth 2 (two) times daily. 180 tablet 3  . nitroGLYCERIN (NITROSTAT) 0.4 MG SL tablet Place 1 tablet (0.4 mg total) under the tongue every 5 (five) minutes as needed for chest pain. 90 tablet 3  . NON FORMULARY Evacetrapib 130 mg -- trial study for cholesterol    . tamsulosin (FLOMAX) 0.4 MG CAPS .4 mg once daily    . traMADol (ULTRAM) 50 MG tablet Take 50 mg by mouth every 6 (six) hours as needed.      Marland Kitchen ULORIC 40 MG tablet 40 mg daily.     No current facility-administered medications for this visit.     Allergies:   Review of patient's allergies indicates no known  allergies.   Social History:  The patient  reports that he has never smoked. He has never used smokeless tobacco. He reports that he drinks alcohol. He reports that he does not use illicit drugs.   Family History:  [name] family history includes Cancer in his mother; Heart attack in his father; Heart disease in his father.   ROS:  Please see the history of present illness.   He had some flulike symptoms after his flu vaccine recently.   All other systems reviewed and negative.    PHYSICAL EXAM: VS:  BP 140/79 mmHg  Pulse 60  Ht 5\' 9"  (1.753 m)  Wt 174 lb (78.926 kg)  BMI 25.68 kg/m2  SpO2 94% Well nourished, well developed, in no acute distress HEENT: normal Neck:  no JVD Cardiac:  normal S1, S2;  RRR; no murmur Lungs:   clear to auscultation bilaterally, no wheezing, rhonchi or rales Abd: soft, nontender, no hepatomegaly Ext:  no edema Skin: warm and dry Neuro:  CNs 2-12 intact, no focal abnormalities noted  EKG:  AV paced, HR 60      ASSESSMENT AND PLAN:  1.  Coronary artery disease involving native coronary artery of native heart with angina pectoris:  The patient has noted increasing amounts of angina with minimal activity. He is on a fairly sizable dose of amlodipine, nitrates and beta blocker. He also remains on aspirin and Plavix. He has chronic kidney disease and would be at somewhat increased risk for contrast-induced nephropathy should we have to proceed with cardiac catheterization.  I reviewed his case today with Dr. Burt Knack.    -  Start Ranexa 500 mg twice a day.    -  Arrange Lexiscan Myoview.    -  Continue medical therapy if nuclear study low risk and symptoms controlled. Consider cardiac catheterization if nuclear study is high risk. 2.  Essential hypertension: Controlled. 3.  Hyperlipidemia:  Continue statin. 4.  PACEMAKER, PERMANENT: Follow-up with EP as planned. 5.  Elevated CK:  Likely related to recent injury on his hip.  He has had chronically  elevated CK in the past.  Continue statin.  Repeat total CK in 2 months.  Disposition:   FU with Dr. Burt Knack as planned.   Signed, Versie Starks, MHS 03/06/2014 10:48 PM    [facility] [place], [place], [place]  [address] Phone: [phone]; Fax: [phone]

## 2014-03-06 NOTE — Telephone Encounter (Signed)
I saw patient 03/02/2014 for his last Accelerate Study visit. I discussed labs with patient. Patient has elevated bun and creatinine. Patient follows with Dr. Mercy Moore on bun and creatinine. I will fax copy of labs to Dr. Mercy Moore. I notified Lauren to let her know CPKis 681U/L. LDL is 94 mg/dl. Patient stated having chest pain when he is on the bike in the gym.I have set up appointment for him to see PA and Lauren will arrange it and call patient with appointment time.

## 2014-03-07 ENCOUNTER — Encounter: Payer: Self-pay | Admitting: Physician Assistant

## 2014-03-07 ENCOUNTER — Ambulatory Visit (INDEPENDENT_AMBULATORY_CARE_PROVIDER_SITE_OTHER): Payer: Commercial Managed Care - HMO | Admitting: Physician Assistant

## 2014-03-07 VITALS — BP 140/79 | HR 60 | Ht 69.0 in | Wt 174.0 lb

## 2014-03-07 DIAGNOSIS — I10 Essential (primary) hypertension: Secondary | ICD-10-CM

## 2014-03-07 DIAGNOSIS — I2510 Atherosclerotic heart disease of native coronary artery without angina pectoris: Secondary | ICD-10-CM

## 2014-03-07 DIAGNOSIS — Z950 Presence of cardiac pacemaker: Secondary | ICD-10-CM

## 2014-03-07 DIAGNOSIS — R079 Chest pain, unspecified: Secondary | ICD-10-CM

## 2014-03-07 DIAGNOSIS — R748 Abnormal levels of other serum enzymes: Secondary | ICD-10-CM

## 2014-03-07 DIAGNOSIS — I25119 Atherosclerotic heart disease of native coronary artery with unspecified angina pectoris: Secondary | ICD-10-CM

## 2014-03-07 DIAGNOSIS — E785 Hyperlipidemia, unspecified: Secondary | ICD-10-CM

## 2014-03-07 MED ORDER — RANOLAZINE ER 500 MG PO TB12: 500.0000 mg | ORAL_TABLET | Freq: Two times a day (BID) | ORAL | Status: DC

## 2014-03-07 NOTE — Patient Instructions (Signed)
Your physician has requested that you have a lexiscan myoview. For further information please visit [email or web address]. Please follow instruction sheet, as given.  Your physician recommends that you return for lab work in: [place] has recommended you make the following change in your medication:  1. START RANEXA 500 MG 1 TABLET TWICE DAILY  FOLLOW UP WITH DR. Burt Knack IN 09/2014

## 2014-03-08 ENCOUNTER — Ambulatory Visit (HOSPITAL_COMMUNITY): Payer: Medicare HMO | Attending: Internal Medicine | Admitting: Radiology

## 2014-03-08 DIAGNOSIS — I2510 Atherosclerotic heart disease of native coronary artery without angina pectoris: Secondary | ICD-10-CM | POA: Diagnosis not present

## 2014-03-08 DIAGNOSIS — E785 Hyperlipidemia, unspecified: Secondary | ICD-10-CM | POA: Diagnosis not present

## 2014-03-08 DIAGNOSIS — I10 Essential (primary) hypertension: Secondary | ICD-10-CM | POA: Insufficient documentation

## 2014-03-08 DIAGNOSIS — I25119 Atherosclerotic heart disease of native coronary artery with unspecified angina pectoris: Secondary | ICD-10-CM

## 2014-03-08 DIAGNOSIS — R079 Chest pain, unspecified: Secondary | ICD-10-CM | POA: Diagnosis not present

## 2014-03-08 MED ORDER — ADENOSINE (DIAGNOSTIC) 3 MG/ML IV SOLN
0.5600 mg/kg | Freq: Once | INTRAVENOUS | Status: AC
  Administered 2014-03-08: 43.2 mg via INTRAVENOUS

## 2014-03-08 MED ORDER — TECHNETIUM TC 99M SESTAMIBI GENERIC - CARDIOLITE
10.0000 | Freq: Once | INTRAVENOUS | Status: AC | PRN
  Administered 2014-03-08: 10 via INTRAVENOUS

## 2014-03-08 MED ORDER — TECHNETIUM TC 99M SESTAMIBI GENERIC - CARDIOLITE
30.0000 | Freq: Once | INTRAVENOUS | Status: AC | PRN
  Administered 2014-03-08: 30 via INTRAVENOUS

## 2014-03-08 NOTE — Progress Notes (Signed)
[facility] [address] [place], [place] [address] [phone]    Cardiology Nuclear Med Study  Kyle Avila is a 68 y.o. male     MRN : [id number]     DOB: 14-Feb-1946  Procedure Date: 03/08/2014  Nuclear Med Background Indication for Stress Test:  Evaluation for Ischemia, Graft Patency and Stent Patency History:  CAD, MPI 2011 (scar) EF 48%, PTVP Cardiac Risk Factors: Carotid Disease, Hypertension and Lipids  Symptoms:  Chest Pain   Nuclear Pre-Procedure Caffeine/Decaff Intake:  None> 12 hrs NPO After: 7:30pm   Lungs:  clear O2 Sat: 98% on room air. IV 0.9% NS with Angio Cath:  22g  IV Site: R Antecubital x 1, tolerated well IV Started by:  Irven Baltimore, RN  Chest Size (in):  40 Cup Size: n/a  Height: 5\' 9"  (1.753 m)  Weight:  170 lb (77.111 kg)  BMI:  Body mass index is 25.09 kg/(m^2). Tech Comments:  Patient took Toprol this am. Irven Baltimore, RN.    Nuclear Med Study 1 or 2 day study: 1 day  Stress Test Type:  Adenosine  Reading MD: N/A  Order Authorizing Provider:  [name], MD, [name], and Richardson Dopp, [facility]  Resting Radionuclide: Technetium 42m Sestamibi  Resting Radionuclide Dose: 11.0 mCi   Stress Radionuclide:  Technetium 67m Sestamibi  Stress Radionuclide Dose: 33.0 mCi           Stress Protocol Rest HR: 61 Stress HR: 60  Rest BP: 139/78 Stress BP: 135/63  Exercise Time (min): n/a METS: n/a           Dose of Adenosine (mg):  43.2 mg Dose of Lexiscan: n/a mg  Dose of Atropine (mg): n/a Dose of Dobutamine: n/a mcg/kg/min (at max HR)  Stress Test Technologist: Glade Lloyd, BS-ES  Nuclear Technologist:  Earl Many, CNMT     Rest Procedure:  Myocardial perfusion imaging was performed at rest 45 minutes following the intravenous administration of Technetium 39m Sestamibi. Rest ECG: v-paced  Stress Procedure:  The patient received IV adenosine at 140 mcg/kg/min for 4 minutes.  Technetium 73m Sestamibi  was injected at the 2 minute mark and quantitative spect images were obtained after a 45 minute delay.  During the infusion of Adenosine the patient complained of chest pain, throat tightness and hot flash.  These symptoms completely resolved in recovery.  Stress ECG: No significant change from baseline ECG  QPS Raw Data Images:  Normal; no motion artifact; normal heart/lung ratio. Stress Images:  Medium-sized, severe basal to to mid inferolateral and basal to mid anterolateral perfusion defect. Rest Images:  Medium-sized, severe basal to mid inferolateral and basal to mid anterolateral perfusion defect. Subtraction (SDS):  Fixed medium-sized, severe basal to mid inferolateral and basal to mid anterolateral perfusion defect Transient Ischemic Dilatation (Normal <1.22):  1.09 Lung/Heart Ratio (Normal <0.45):  0.26  Quantitative Gated Spect Images QGS EDV:  144 ml QGS ESV:  85 ml  Impression Exercise Capacity:  Adenosine study with no exercise. BP Response:  Normal blood pressure response. Clinical Symptoms:  Chest pain. ECG Impression:  V-paced Comparison with Prior Nuclear Study: No significant change from previous study  Overall Impression:  Intermediate risk stress nuclear study with a fixed medium-sized, severe basal to mid inferolateral and basal to mid anterolateral perfusion defect.  This suggests prior infarction without significant ischemia.  Study rated intermediate risk due to low EF at 42%..  LV Ejection Fraction: 42%.  LV  Wall Motion:  Lateral and inferior hypokinesis.    Loralie Champagne 03/08/2014

## 2014-03-09 ENCOUNTER — Encounter: Payer: Self-pay | Admitting: Cardiology

## 2014-03-09 ENCOUNTER — Encounter: Payer: Self-pay | Admitting: Physician Assistant

## 2014-03-10 ENCOUNTER — Telehealth: Payer: Self-pay | Admitting: *Deleted

## 2014-03-10 NOTE — Telephone Encounter (Signed)
lmptcb to go over Juliette results

## 2014-03-13 NOTE — Telephone Encounter (Signed)
F/u ° ° °Pt returning your call °

## 2014-03-13 NOTE — Telephone Encounter (Signed)
pt notified about myoview results with verbal understanding to results given. Pt states he would like to see if there is another med that is cheaper than the Ranexa. Advised I will d/w Dr. Burt Knack, Brynda Rim. PA and try to cb later this week, pt said ok.

## 2014-03-14 ENCOUNTER — Telehealth: Payer: Self-pay | Admitting: *Deleted

## 2014-03-14 MED ORDER — ISOSORBIDE MONONITRATE ER 120 MG PO TB24: ORAL_TABLET | ORAL | Status: DC

## 2014-03-14 NOTE — Telephone Encounter (Signed)
pt states he is going to try the Ranexa for the 2 weeks of samples given to see how he does w/exercise. Pt states if not any better then he will change to the increase dose of Imdur 120 mg daily per Dr. Burt Knack. Pt asked for me to send in imdur 120 mg.

## 2014-03-14 NOTE — Telephone Encounter (Signed)
Sounds good. Richardson Dopp, PA-C   03/14/2014 10:16 PM

## 2014-03-14 NOTE — Telephone Encounter (Signed)
lmptcb to go over recommendation from Dr. Burt Knack that we could increase Imdur to 120 mg daily and stop the Ranexa.

## 2014-03-24 ENCOUNTER — Other Ambulatory Visit: Payer: Self-pay | Admitting: *Deleted

## 2014-03-24 DIAGNOSIS — R079 Chest pain, unspecified: Secondary | ICD-10-CM

## 2014-03-24 DIAGNOSIS — I25119 Atherosclerotic heart disease of native coronary artery with unspecified angina pectoris: Secondary | ICD-10-CM

## 2014-03-24 MED ORDER — RANOLAZINE ER 500 MG PO TB12: 500.0000 mg | ORAL_TABLET | Freq: Two times a day (BID) | ORAL | Status: DC

## 2014-03-24 MED ORDER — CLOPIDOGREL BISULFATE 75 MG PO TABS: ORAL_TABLET | ORAL | Status: DC

## 2014-03-24 MED ORDER — ISOSORBIDE MONONITRATE ER 60 MG PO TB24: ORAL_TABLET | ORAL | Status: DC

## 2014-03-24 MED ORDER — NITROGLYCERIN 0.4 MG SL SUBL: 0.4000 mg | SUBLINGUAL_TABLET | SUBLINGUAL | Status: DC | PRN

## 2014-03-24 MED ORDER — ATORVASTATIN CALCIUM 80 MG PO TABS: 40.0000 mg | ORAL_TABLET | Freq: Every day | ORAL | Status: DC

## 2014-03-24 NOTE — Telephone Encounter (Signed)
Patient calling for Ranexa Samples b/c in donut hole. Given Ranexa 500mg  bid, 4 boxes

## 2014-04-17 ENCOUNTER — Telehealth: Payer: Self-pay | Admitting: Cardiovascular Disease

## 2014-04-17 DIAGNOSIS — R079 Chest pain, unspecified: Secondary | ICD-10-CM

## 2014-04-17 DIAGNOSIS — I25119 Atherosclerotic heart disease of native coronary artery with unspecified angina pectoris: Secondary | ICD-10-CM

## 2014-04-17 MED ORDER — ATORVASTATIN CALCIUM 80 MG PO TABS: 40.0000 mg | ORAL_TABLET | Freq: Every day | ORAL | Status: DC

## 2014-04-17 MED ORDER — ISOSORBIDE MONONITRATE ER 60 MG PO TB24: ORAL_TABLET | ORAL | Status: DC

## 2014-04-17 NOTE — Telephone Encounter (Signed)
I spoke with the pt and he wanted to make Dr Burt Knack aware that he was able to ski in [place] for 2 weeks without chest pain due to Ranexa.  The pt also requested that I authorize refills to his pharmacy for Atorvastatin and Isosorbide [place]. Refills sent.

## 2014-04-17 NOTE — Telephone Encounter (Signed)
New message ° ° ° ° ° ° °Talk to the nurse regarding his medications °

## 2014-05-01 ENCOUNTER — Encounter: Payer: Self-pay | Admitting: Cardiovascular Disease

## 2014-05-01 NOTE — Telephone Encounter (Signed)
New Message  Pt called wanting to speak with Rn concerning lab work; Please call back and discuss.

## 2014-05-01 NOTE — Telephone Encounter (Signed)
This encounter was created in error - please disregard.

## 2014-05-03 ENCOUNTER — Other Ambulatory Visit: Payer: Commercial Managed Care - HMO

## 2014-05-03 DIAGNOSIS — J302 Other seasonal allergic rhinitis: Secondary | ICD-10-CM | POA: Diagnosis not present

## 2014-05-03 DIAGNOSIS — R05 Cough: Secondary | ICD-10-CM | POA: Diagnosis not present

## 2014-05-03 DIAGNOSIS — Z6826 Body mass index (BMI) 26.0-26.9, adult: Secondary | ICD-10-CM | POA: Diagnosis not present

## 2014-05-03 DIAGNOSIS — J209 Acute bronchitis, unspecified: Secondary | ICD-10-CM | POA: Diagnosis not present

## 2014-05-03 DIAGNOSIS — I2510 Atherosclerotic heart disease of native coronary artery without angina pectoris: Secondary | ICD-10-CM | POA: Diagnosis not present

## 2014-05-03 DIAGNOSIS — I10 Essential (primary) hypertension: Secondary | ICD-10-CM | POA: Diagnosis not present

## 2014-05-05 ENCOUNTER — Telehealth: Payer: Self-pay | Admitting: *Deleted

## 2014-05-05 ENCOUNTER — Encounter: Payer: Self-pay | Admitting: Cardiology

## 2014-05-05 NOTE — Telephone Encounter (Signed)
I spoke to patient about CK lab level. Ck has improved. Level was 254U/L. Labs have been sent to Dr. Burt Knack .

## 2014-05-25 ENCOUNTER — Telehealth: Payer: Self-pay | Admitting: Cardiovascular Disease

## 2014-05-25 DIAGNOSIS — R079 Chest pain, unspecified: Secondary | ICD-10-CM

## 2014-05-25 DIAGNOSIS — I25119 Atherosclerotic heart disease of native coronary artery with unspecified angina pectoris: Secondary | ICD-10-CM

## 2014-05-25 NOTE — Telephone Encounter (Signed)
New message    Patient calling    Pt C/O of Chest Pain: STAT if CP now or developed within 24 hours  1. Are you having CP right now?  No  Patient stating he having angina when exercising   2. Are you experiencing any other symptoms (ex. SOB, nausea, vomiting, sweating)? Angina when exercising   3. How long have you been experiencing CP? Since 1998 stats nothing new . Get relief when taken ranexa 500 mg   4. Is your CP continuous or coming and going? Patient stats only when exercise.   5. Have you taken Nitroglycerin? Yes - since 1998  ?

## 2014-05-25 NOTE — Telephone Encounter (Signed)
Would go up to 1000 mg BID. Would he like to consider cardiac cath since he's having breakthrough angina on near-maximal therapy. I'm happy to see him to discuss.

## 2014-05-25 NOTE — Telephone Encounter (Signed)
I spoke with the pt and when he started Ranexa 500mg  twice a day a few weeks ago this improved his anginal symptoms.  The pt has now started to develop breakthrough angina with exercise again.  Yesterday he took a SL nitro during exercise.  The pt would like to know if he can take Ranexa 500mg  three times a day.  I made the pt aware that he could try this but the goal dosage for Ranexa is 1000mg  twice a day.  The pt would like to know what Dr Burt Knack recommends in regards to angina and medications.

## 2014-05-26 ENCOUNTER — Other Ambulatory Visit: Payer: Self-pay

## 2014-05-26 MED ORDER — NITROGLYCERIN 0.4 MG SL SUBL: 0.4000 mg | SUBLINGUAL_TABLET | SUBLINGUAL | Status: DC | PRN

## 2014-05-29 ENCOUNTER — Telehealth: Payer: Self-pay | Admitting: Family

## 2014-05-29 NOTE — Telephone Encounter (Signed)
Left message on machine for pt to contact the office.   

## 2014-05-29 NOTE — Telephone Encounter (Signed)
Patient called to cancel his 1 year carotid follow up stating that we charged the billing. He said that he to speak to the billing department to undersatnd the changes that occurred. If our office wishes to speak with him about this matter, he can be reached at [phone].

## 2014-05-30 MED ORDER — RANOLAZINE ER 500 MG PO TB12: ORAL_TABLET | ORAL | Status: DC

## 2014-05-30 NOTE — Telephone Encounter (Signed)
F/u ° ° °Pt returning your call °

## 2014-05-30 NOTE — Telephone Encounter (Signed)
I spoke with the pt and made him aware of Dr Antionette Char recommendations.  At this time the pt would like to take Ranexa 1000mg  in the morning and 500mg  in the evening. I made the pt aware that he can try this change and if he continues to have angina we can increase Ranexa to 1000mg  twice a day and arrange office visit to discuss symptoms.  The pt will keep in touch with our office about his symptoms.

## 2014-06-01 ENCOUNTER — Encounter: Payer: Self-pay | Admitting: Internal Medicine

## 2014-06-01 ENCOUNTER — Ambulatory Visit (INDEPENDENT_AMBULATORY_CARE_PROVIDER_SITE_OTHER): Payer: Commercial Managed Care - HMO | Admitting: Internal Medicine

## 2014-06-01 VITALS — BP 122/80 | HR 69 | Ht 69.0 in | Wt 176.2 lb

## 2014-06-01 DIAGNOSIS — I4430 Unspecified atrioventricular block: Secondary | ICD-10-CM

## 2014-06-01 DIAGNOSIS — Z45018 Encounter for adjustment and management of other part of cardiac pacemaker: Secondary | ICD-10-CM

## 2014-06-01 LAB — MDC_IDC_ENUM_SESS_TYPE_INCLINIC
Battery Voltage: 2.63 V
Brady Statistic AP VS Percent: 0 %
Lead Channel Pacing Threshold Amplitude: 0.5 V
Lead Channel Pacing Threshold Amplitude: 0.75 V
Lead Channel Pacing Threshold Pulse Width: 0.4 ms
Lead Channel Setting Pacing Amplitude: 2 V
Lead Channel Setting Pacing Amplitude: 2.5 V
Lead Channel Setting Pacing Pulse Width: 0.4 ms
Lead Channel Setting Sensing Sensitivity: 4 mV
MDC IDC MSMT BATTERY IMPEDANCE: 4675 Ohm
MDC IDC MSMT BATTERY REMAINING LONGEVITY: 5 mo
MDC IDC MSMT LEADCHNL RA IMPEDANCE VALUE: 570 Ohm
MDC IDC MSMT LEADCHNL RA PACING THRESHOLD PULSEWIDTH: 0.4 ms
MDC IDC MSMT LEADCHNL RA SENSING INTR AMPL: 2 mV
MDC IDC MSMT LEADCHNL RV IMPEDANCE VALUE: 456 Ohm
MDC IDC SESS DTM: 20160204101920
MDC IDC STAT BRADY AP VP PERCENT: 47 %
MDC IDC STAT BRADY AS VP PERCENT: 53 %
MDC IDC STAT BRADY AS VS PERCENT: 0 %

## 2014-06-01 NOTE — Progress Notes (Signed)
Patient Care Team: Tivis Ringer, MD as PCP - General (Internal Medicine) Windy Kalata, MD as Consulting Physician (Nephrology) Blane Ohara, MD (Cardiology) Deboraha Sprang, MD (Cardiology)   HPI  Kyle Avila is a 69 y.o. 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   He has a complex cardiovascular history with bypass surgery and redo bypass in 96 and revascularization with stenting of the circumflex in 2009. At that time he had total LAD and RCA disease; his left ventricular function then was okay.   Myoview 11/15 >>Intermediate risk stress nuclear study with a fixed medium-sized, severe basal to mid inferolateral and basal to mid anterolateral perfusion defect. This suggests prior infarction without significant ischemia. Study rated intermediate risk due to low EF at 42%..  LV Ejection Fraction: 42%. LV Wall Motion: Lateral and inferior hypokinesis.    The patient is having some problems again with exertional chest pain accompanied by shortness of breath but without  nocturnal dyspnea, orthopnea or peripheral edema. There have been no palpitations, lightheadedness or syncope.    Past Medical History  Diagnosis Date  . Mitral insufficiency   . CAD (coronary artery disease)     s/p CABG  . Heart block   . Gout   . HTN (hypertension)   . Hyperlipidemia   . Chronic renal insufficiency   . 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 Carotid artery occlusion   . Hx of cardiovascular stress test     Adenosine Myoview (11/15):  Inferolateral and anterolateral infarct without significant ischemia, EF 42% - Intermediate Risk    Past Surgical History  Procedure Laterality Date  . Coronary artery bypass graft      with redo cabg  . Cardiac catheterization    . Pacemaker insertion  2007  . Carotid endarterectomy  2002    Current Outpatient Prescriptions  Medication Sig Dispense Refill  . allopurinol  (ZYLOPRIM) 100 MG tablet Take 1 tablet by mouth daily.    Marland Kitchen amLODipine (NORVASC) 5 MG tablet Take 1 tablet (5 mg total) by mouth daily. 90 tablet 3  . aspirin EC 81 MG EC tablet Take 81 mg by mouth daily.  1 tablet 0  . atorvastatin (LIPITOR) 80 MG tablet Take 40 mg by mouth daily.    . clopidogrel (PLAVIX) 75 MG tablet Take 1 tablet by mouth  daily 90 tablet 3  . colchicine (COLCRYS) 0.6 MG tablet Take 0.6 mg by mouth daily as needed (pain).     Marland Kitchen diphenhydrAMINE (BENADRYL) 25 mg capsule Take 25 mg by mouth daily.      . isosorbide mononitrate (IMDUR) 60 MG 24 hr tablet Take 1 tablet by mouth  daily 90 tablet 3  . ketoconazole (NIZORAL) 2 % cream Apply 1 application topically 2 (two) times daily as needed for irritation.     Marland Kitchen losartan (COZAAR) 100 MG tablet Take 1 tablet by mouth  twice a day 180 tablet 2  . meclizine (ANTIVERT) 25 MG tablet Take 25 mg by mouth 2 (two) times daily as needed for dizziness or nausea.     . metoprolol succinate (TOPROL-XL) 50 MG 24 hr tablet Take 1 tablet (50 mg total) by mouth 2 (two) times daily. 180 tablet 3  . nitroGLYCERIN (NITROSTAT) 0.4 MG SL tablet Place 1 tablet (0.4 mg total) under the tongue every 5 (five) minutes as needed for chest pain. 90 tablet 0  . ranolazine (RANEXA) 500 MG 12  hr tablet Take 2 tablets in the morning and 1 tablet in the evening 180 tablet 3  . tamsulosin (FLOMAX) 0.4 MG CAPS Take 0.4 mg by mouth once daily    . traMADol (ULTRAM) 50 MG tablet Take 50 mg by mouth every 6 (six) hours as needed (pain).     . Triamcinolone Acetonide (NASACORT AQ NA) Place 2 sprays into the nose daily.    Marland Kitchen HYDROcodone-acetaminophen (NORCO) 7.5-325 MG per tablet Take 1 tablet by mouth every 4 (four) hours as needed (pain).      No current facility-administered medications for this visit.    No Known Allergies  Review of Systems negative except from HPI and PMH  Physical Exam BP 122/80 mmHg  Pulse 69  Ht 5\' 9"  (1.753 m)  Wt 176 lb 3.2 oz  (79.924 kg)  BMI 26.01 kg/m2 Well developed and nourished in no acute distress HENT normal Neck supple with JVP-flat Clear Regular rate and rhythm, no murmurs or gallops Abd-soft with active BS No Clubbing cyanosis edema Skin-warm and dry A & Oriented  Grossly normal sensory and motor function Device pocket well healed; without hematoma or erythema.  There is no tethering    Assessment and  Plan  Ischemic cardiomyopathy with prior MI  Hypertension -well-controlled  Pacemaker-Medtronic [name] device was interrogated.  The information was reviewed. No changes were made in the programming.     HighGrade heart block device dependent  satble rhythm issue  Reviewed anginal strategy

## 2014-06-01 NOTE — Patient Instructions (Signed)

## 2014-07-25 ENCOUNTER — Other Ambulatory Visit (HOSPITAL_COMMUNITY): Payer: Medicare HMO

## 2014-07-25 ENCOUNTER — Ambulatory Visit: Payer: Medicare HMO | Admitting: Family

## 2014-07-25 DIAGNOSIS — Z6826 Body mass index (BMI) 26.0-26.9, adult: Secondary | ICD-10-CM | POA: Diagnosis not present

## 2014-07-25 DIAGNOSIS — J3489 Other specified disorders of nose and nasal sinuses: Secondary | ICD-10-CM | POA: Diagnosis not present

## 2014-08-02 DIAGNOSIS — E785 Hyperlipidemia, unspecified: Secondary | ICD-10-CM | POA: Diagnosis not present

## 2014-08-02 DIAGNOSIS — M109 Gout, unspecified: Secondary | ICD-10-CM | POA: Diagnosis not present

## 2014-08-02 DIAGNOSIS — I10 Essential (primary) hypertension: Secondary | ICD-10-CM | POA: Diagnosis not present

## 2014-08-02 DIAGNOSIS — E039 Hypothyroidism, unspecified: Secondary | ICD-10-CM | POA: Diagnosis not present

## 2014-08-02 DIAGNOSIS — I2510 Atherosclerotic heart disease of native coronary artery without angina pectoris: Secondary | ICD-10-CM | POA: Diagnosis not present

## 2014-08-09 DIAGNOSIS — Z0000 Encounter for general adult medical examination without abnormal findings: Secondary | ICD-10-CM | POA: Diagnosis not present

## 2014-08-09 DIAGNOSIS — N183 Chronic kidney disease, stage 3 (moderate): Secondary | ICD-10-CM | POA: Diagnosis not present

## 2014-08-09 DIAGNOSIS — Z1212 Encounter for screening for malignant neoplasm of rectum: Secondary | ICD-10-CM | POA: Diagnosis not present

## 2014-08-09 DIAGNOSIS — E785 Hyperlipidemia, unspecified: Secondary | ICD-10-CM | POA: Diagnosis not present

## 2014-08-09 DIAGNOSIS — I25118 Atherosclerotic heart disease of native coronary artery with other forms of angina pectoris: Secondary | ICD-10-CM | POA: Diagnosis not present

## 2014-08-09 DIAGNOSIS — I10 Essential (primary) hypertension: Secondary | ICD-10-CM | POA: Diagnosis not present

## 2014-08-09 DIAGNOSIS — D692 Other nonthrombocytopenic purpura: Secondary | ICD-10-CM | POA: Insufficient documentation

## 2014-08-09 DIAGNOSIS — I252 Old myocardial infarction: Secondary | ICD-10-CM | POA: Diagnosis not present

## 2014-08-09 DIAGNOSIS — M5126 Other intervertebral disc displacement, lumbar region: Secondary | ICD-10-CM | POA: Diagnosis not present

## 2014-08-09 DIAGNOSIS — Z950 Presence of cardiac pacemaker: Secondary | ICD-10-CM | POA: Diagnosis not present

## 2014-08-30 ENCOUNTER — Telehealth: Payer: Self-pay | Admitting: Cardiovascular Disease

## 2014-08-30 DIAGNOSIS — E785 Hyperlipidemia, unspecified: Secondary | ICD-10-CM

## 2014-08-30 NOTE — Telephone Encounter (Signed)
Research labs are scanned into chart from 02/2014 but part of these results are blinded.  I will forward this message to Dr Burt Knack to review the pt's chart and determine if labs are needed.

## 2014-08-30 NOTE — Telephone Encounter (Signed)
New problem   Pt want to know if he need labs before his appt in July.

## 2014-08-31 ENCOUNTER — Ambulatory Visit (INDEPENDENT_AMBULATORY_CARE_PROVIDER_SITE_OTHER): Payer: Medicare HMO | Admitting: *Deleted

## 2014-08-31 DIAGNOSIS — I4430 Unspecified atrioventricular block: Secondary | ICD-10-CM | POA: Diagnosis not present

## 2014-08-31 NOTE — Telephone Encounter (Signed)
Reviewed chart. He doesn't show up as 'research active' anymore - wonder if his study closed. If so, he should have lipids, lft's, and ck level. thx

## 2014-09-01 DIAGNOSIS — Z1212 Encounter for screening for malignant neoplasm of rectum: Secondary | ICD-10-CM | POA: Diagnosis not present

## 2014-09-04 ENCOUNTER — Encounter: Payer: Self-pay | Admitting: Internal Medicine

## 2014-09-04 ENCOUNTER — Ambulatory Visit (INDEPENDENT_AMBULATORY_CARE_PROVIDER_SITE_OTHER): Payer: Commercial Managed Care - HMO | Admitting: Internal Medicine

## 2014-09-04 ENCOUNTER — Encounter: Payer: Self-pay | Admitting: *Deleted

## 2014-09-04 VITALS — BP 130/68 | HR 65 | Ht 69.0 in | Wt 169.6 lb

## 2014-09-04 DIAGNOSIS — Z45010 Encounter for checking and testing of cardiac pacemaker pulse generator [battery]: Secondary | ICD-10-CM | POA: Diagnosis not present

## 2014-09-04 DIAGNOSIS — I255 Ischemic cardiomyopathy: Secondary | ICD-10-CM | POA: Diagnosis not present

## 2014-09-04 DIAGNOSIS — Z01812 Encounter for preprocedural laboratory examination: Secondary | ICD-10-CM | POA: Diagnosis not present

## 2014-09-04 DIAGNOSIS — I4430 Unspecified atrioventricular block: Secondary | ICD-10-CM

## 2014-09-04 LAB — CBC WITH DIFFERENTIAL/PLATELET
Basophils Absolute: 0 10*3/uL (ref 0.0–0.1)
Basophils Relative: 0.2 % (ref 0.0–3.0)
Eosinophils Absolute: 0.3 10*3/uL (ref 0.0–0.7)
Eosinophils Relative: 6.4 % — ABNORMAL HIGH (ref 0.0–5.0)
HCT: 42.7 % (ref 39.0–52.0)
Hemoglobin: 14.4 g/dL (ref 13.0–17.0)
LYMPHS ABS: 1.2 10*3/uL (ref 0.7–4.0)
LYMPHS PCT: 26.4 % (ref 12.0–46.0)
MCHC: 33.8 g/dL (ref 30.0–36.0)
MCV: 100.1 fl — ABNORMAL HIGH (ref 78.0–100.0)
MONOS PCT: 14.9 % — AB (ref 3.0–12.0)
Monocytes Absolute: 0.7 10*3/uL (ref 0.1–1.0)
Neutro Abs: 2.5 10*3/uL (ref 1.4–7.7)
Neutrophils Relative %: 52.1 % (ref 43.0–77.0)
PLATELETS: 148 10*3/uL — AB (ref 150.0–400.0)
RBC: 4.26 Mil/uL (ref 4.22–5.81)
RDW: 13 % (ref 11.5–15.5)
WBC: 4.7 10*3/uL (ref 4.0–10.5)

## 2014-09-04 LAB — CUP PACEART REMOTE DEVICE CHECK
Battery Impedance: 8028 Ohm
Battery Remaining Longevity: -2 mo
Brady Statistic RV Percent Paced: 99 %
Lead Channel Impedance Value: 67 Ohm
Lead Channel Setting Pacing Amplitude: 2.5 V
Lead Channel Setting Pacing Pulse Width: 0.4 ms
Lead Channel Setting Sensing Sensitivity: 4 mV
MDC IDC MSMT BATTERY VOLTAGE: 2.55 V
MDC IDC MSMT LEADCHNL RV IMPEDANCE VALUE: 459 Ohm
MDC IDC SESS DTM: 20160507004012

## 2014-09-04 LAB — BASIC METABOLIC PANEL
BUN: 44 mg/dL — AB (ref 6–23)
CO2: 30 mEq/L (ref 19–32)
CREATININE: 2.06 mg/dL — AB (ref 0.40–1.50)
Calcium: 9.1 mg/dL (ref 8.4–10.5)
Chloride: 105 mEq/L (ref 96–112)
GFR: 34.13 mL/min — ABNORMAL LOW (ref >60.00)
Glucose, Bld: 89 mg/dL (ref 70–99)
Potassium: 4.7 mEq/L (ref 3.5–5.1)
Sodium: 138 mEq/L (ref 135–145)

## 2014-09-04 NOTE — Telephone Encounter (Signed)
Per Adonis Huguenin --Ninfa Linden completed the Accelerate Study on 07/05/14.

## 2014-09-04 NOTE — Telephone Encounter (Signed)
I spoke with the pt while he was in the office and arranged lab work on 10/27/14.

## 2014-09-04 NOTE — Progress Notes (Signed)
Remote pacemaker transmission.   

## 2014-09-04 NOTE — Patient Instructions (Signed)
Medication Instructions:  Your physician recommends that you continue on your current medications as directed. Please refer to the Current Medication list given to you today.  Labwork: Pre procedure labs today  Testing/Procedures: Your physician has recommended that you have a pacemaker generator change. Please see the instruction sheet given to you today for more information.  Follow-Up: Your wound check is scheduled for  at  at [address]    Thank you for choosing [facility]!!

## 2014-09-04 NOTE — Progress Notes (Signed)
Patient Care Team: [name], MD as PCP - General (Internal Medicine) Fleet Contras, MD as Consulting Physician (Nephrology) [name], MD (Cardiology) Deboraha Sprang, MD (Cardiology)   HPI  Kyle Avila is a 69 y.o. 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   He has a complex cardiovascular history with bypass surgery and redo bypass in 96 and revascularization with stenting of the circumflex in 2009. At that time he had total LAD and RCA disease; his left ventricular function then was okay.   Myoview 11/15 >>Intermediate risk stress nuclear study with a fixed medium-sized, severe basal to mid inferolateral and basal to mid anterolateral perfusion defect. This suggests prior infarction without significant ischemia. Study rated intermediate risk due to low EF at 42%.Marland Kitchen  He reverted to VVI couple of months ago. This is been associated with significant sluggishness but no chest pain or shortness of breath.   Past Medical History  Diagnosis Date  . Mitral insufficiency   . CAD (coronary artery disease)     s/p CABG  . Heart block   . Gout   . HTN (hypertension)   . Hyperlipidemia   . Chronic renal insufficiency   . 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 Carotid artery occlusion   . Hx of cardiovascular stress test     Adenosine Myoview (11/15):  Inferolateral and anterolateral infarct without significant ischemia, EF 42% - Intermediate Risk    Past Surgical History  Procedure Laterality Date  . Coronary artery bypass graft      with redo cabg  . Cardiac catheterization    . Pacemaker insertion  2007  . Carotid endarterectomy  2002    Current Outpatient Prescriptions  Medication Sig Dispense Refill  . allopurinol (ZYLOPRIM) 100 MG tablet Take 1 tablet by mouth daily.    Marland Kitchen amLODipine (NORVASC) 5 MG tablet Take 1 tablet (5 mg total) by mouth daily. 90 tablet 3  . aspirin EC 81 MG EC tablet Take 81 mg  by mouth daily.  1 tablet 0  . atorvastatin (LIPITOR) 80 MG tablet Take 40 mg by mouth daily.    . clopidogrel (PLAVIX) 75 MG tablet Take 1 tablet by mouth  daily 90 tablet 3  . colchicine (COLCRYS) 0.6 MG tablet Take 0.6 mg by mouth daily as needed (pain).     Marland Kitchen HYDROcodone-acetaminophen (NORCO) 7.5-325 MG per tablet Take 1 tablet by mouth every 4 (four) hours as needed (pain).     . isosorbide mononitrate (IMDUR) 60 MG 24 hr tablet Take 1 tablet by mouth  daily 90 tablet 3  . ketoconazole (NIZORAL) 2 % cream Apply 1 application topically 2 (two) times daily as needed for irritation.     Marland Kitchen losartan (COZAAR) 100 MG tablet Take 1 tablet by mouth  twice a day 180 tablet 2  . meclizine (ANTIVERT) 25 MG tablet Take 25 mg by mouth 2 (two) times daily as needed for dizziness or nausea.     . metoprolol succinate (TOPROL-XL) 50 MG 24 hr tablet Take 1 tablet (50 mg total) by mouth 2 (two) times daily. 180 tablet 3  . nitroGLYCERIN (NITROSTAT) 0.4 MG SL tablet Place 1 tablet (0.4 mg total) under the tongue every 5 (five) minutes as needed for chest pain. 90 tablet 0  . ranolazine (RANEXA) 500 MG 12 hr tablet Take 2 tablets in the morning and 1 tablet in the evening 180 tablet 3  . tamsulosin (FLOMAX) 0.4  MG CAPS Take 0.4 mg by mouth once daily    . traMADol (ULTRAM) 50 MG tablet Take 50 mg by mouth every 6 (six) hours as needed (pain).     . Triamcinolone Acetonide (NASACORT AQ NA) Place 2 sprays into the nose daily.     No current facility-administered medications for this visit.    No Known Allergies  Review of Systems negative except from HPI and PMH  Physical Exam BP 130/68 mmHg  Pulse 65  Ht 5\' 9"  (1.753 m)  Wt 169 lb 9.6 oz (76.93 kg)  BMI 25.03 kg/m2 Well developed and nourished in no acute distress HENT normal Neck supple with JVP-flat Clear Regular rate and rhythm, no murmurs or gallops Abd-soft with active BS No Clubbing cyanosis edema Skin-warm and dry A & Oriented  Grossly  normal sensory and motor function Device pocket well healed; without hematoma or erythema.  There is no tethering   ECG demonstrates sinus rhythm at a heart rate of 45 with underlying ventricular pacing at 65  Assessment and  Plan  Ischemic cardiomyopathy with prior MI  Hypertension -well-controlled  Pacemaker-Medtronic [name] device was interrogated.  The information was reviewed. No changes were made in the programming.     HighGrade heart block device dependent  He unfortunately reverted to VVI shortly after his last visit.  We have reviewed the benefits and risks of generator replacement.  These include but are not limited to lead fracture and infection.  The patient understands, agrees and is willing to proceed.

## 2014-09-08 ENCOUNTER — Encounter: Payer: Self-pay | Admitting: Internal Medicine

## 2014-09-08 ENCOUNTER — Telehealth: Payer: Self-pay | Admitting: Cardiovascular Disease

## 2014-09-08 NOTE — Telephone Encounter (Signed)
Calling stating he is scheduled for a change out of his pacemaker next week and wanted to know what his restrictions would be post surgery.  Advised he would not be able to lift his arm and very light activity until seen in wound clinic on 6/1.  He verbalizes understanding

## 2014-09-08 NOTE — Telephone Encounter (Signed)
New message   Patient calling what's are his restriction after his pacemaker procedure.

## 2014-09-12 ENCOUNTER — Telehealth: Payer: Self-pay | Admitting: Internal Medicine

## 2014-09-12 NOTE — Telephone Encounter (Signed)
New message    Patient calling would like some information on pace maker / make / model / why Dr. Caryl Comes choosen that options.

## 2014-09-12 NOTE — Telephone Encounter (Signed)
Patient wanted to know if his new PPM would have a vibrating component when it reaches ERI.  He also wanted to make sure that the new generator he was getting was a model that was being tested. He emphasized needing to "make sure he was comfortable" - he felt like Dr. Caryl Comes was rushed last week in the office.  He would like Dr. Caryl Comes to call him this evening if possible.  Advised that he may not be able to and confirmed ok to speak with him prior to procedure tomorrow. Patient agreeable to this.

## 2014-09-13 ENCOUNTER — Encounter (HOSPITAL_COMMUNITY)
Admission: RE | Disposition: A | Discharge status: Home / Self Care | Payer: Commercial Managed Care - HMO | Source: Ambulatory Visit | Attending: Internal Medicine

## 2014-09-13 ENCOUNTER — Encounter (HOSPITAL_COMMUNITY): Payer: Self-pay | Admitting: Internal Medicine

## 2014-09-13 ENCOUNTER — Ambulatory Visit (HOSPITAL_COMMUNITY)
Admission: RE | Admit: 2014-09-13 | Discharge: 2014-09-13 | Disposition: A | Discharge status: Home / Self Care | Payer: Commercial Managed Care - HMO | Source: Ambulatory Visit | Attending: Internal Medicine | Admitting: Internal Medicine

## 2014-09-13 DIAGNOSIS — Z79899 Other long term (current) drug therapy: Secondary | ICD-10-CM | POA: Insufficient documentation

## 2014-09-13 DIAGNOSIS — Z45010 Encounter for checking and testing of cardiac pacemaker pulse generator [battery]: Secondary | ICD-10-CM | POA: Insufficient documentation

## 2014-09-13 DIAGNOSIS — Z7982 Long term (current) use of aspirin: Secondary | ICD-10-CM | POA: Insufficient documentation

## 2014-09-13 DIAGNOSIS — I255 Ischemic cardiomyopathy: Secondary | ICD-10-CM | POA: Diagnosis not present

## 2014-09-13 DIAGNOSIS — I129 Hypertensive chronic kidney disease with stage 1 through stage 4 chronic kidney disease, or unspecified chronic kidney disease: Secondary | ICD-10-CM | POA: Insufficient documentation

## 2014-09-13 DIAGNOSIS — I252 Old myocardial infarction: Secondary | ICD-10-CM | POA: Diagnosis not present

## 2014-09-13 DIAGNOSIS — Z7902 Long term (current) use of antithrombotics/antiplatelets: Secondary | ICD-10-CM | POA: Diagnosis not present

## 2014-09-13 DIAGNOSIS — Z955 Presence of coronary angioplasty implant and graft: Secondary | ICD-10-CM | POA: Insufficient documentation

## 2014-09-13 DIAGNOSIS — Z01812 Encounter for preprocedural laboratory examination: Secondary | ICD-10-CM

## 2014-09-13 DIAGNOSIS — N189 Chronic kidney disease, unspecified: Secondary | ICD-10-CM | POA: Diagnosis not present

## 2014-09-13 DIAGNOSIS — I4430 Unspecified atrioventricular block: Secondary | ICD-10-CM

## 2014-09-13 DIAGNOSIS — Z951 Presence of aortocoronary bypass graft: Secondary | ICD-10-CM | POA: Insufficient documentation

## 2014-09-13 DIAGNOSIS — I2510 Atherosclerotic heart disease of native coronary artery without angina pectoris: Secondary | ICD-10-CM | POA: Insufficient documentation

## 2014-09-13 DIAGNOSIS — Z79891 Long term (current) use of opiate analgesic: Secondary | ICD-10-CM | POA: Insufficient documentation

## 2014-09-13 DIAGNOSIS — Z7951 Long term (current) use of inhaled steroids: Secondary | ICD-10-CM | POA: Insufficient documentation

## 2014-09-13 DIAGNOSIS — I442 Atrioventricular block, complete: Secondary | ICD-10-CM | POA: Insufficient documentation

## 2014-09-13 DIAGNOSIS — E785 Hyperlipidemia, unspecified: Secondary | ICD-10-CM | POA: Diagnosis not present

## 2014-09-13 HISTORY — PX: EP IMPLANTABLE DEVICE: SHX172B

## 2014-09-13 LAB — SURGICAL PCR SCREEN
MRSA, PCR: NEGATIVE
Staphylococcus aureus: NEGATIVE

## 2014-09-13 SURGERY — PPM/BIV PPM GENERATOR CHANGEOUT
Anesthesia: LOCAL

## 2014-09-13 MED ORDER — CHLORHEXIDINE GLUCONATE 4 % EX LIQD: 60.0000 mL | Freq: Once | CUTANEOUS | Status: DC

## 2014-09-13 MED ORDER — LIDOCAINE HCL (PF) 1 % IJ SOLN
INTRAMUSCULAR | Status: AC
  Filled 2014-09-13: qty 30

## 2014-09-13 MED ORDER — SODIUM CHLORIDE 0.9 % IV SOLN
INTRAVENOUS | Status: DC
  Administered 2014-09-13: 08:00:00 via INTRAVENOUS

## 2014-09-13 MED ORDER — SODIUM CHLORIDE 0.9 % IR SOLN
80.0000 mg | Status: AC
  Administered 2014-09-13: 80 mg
  Filled 2014-09-13: qty 2

## 2014-09-13 MED ORDER — MIDAZOLAM HCL 5 MG/5ML IJ SOLN
INTRAMUSCULAR | Status: AC
  Filled 2014-09-13: qty 5

## 2014-09-13 MED ORDER — ONDANSETRON HCL 4 MG/2ML IJ SOLN: 4.0000 mg | Freq: Four times a day (QID) | INTRAMUSCULAR | Status: DC | PRN

## 2014-09-13 MED ORDER — FENTANYL CITRATE (PF) 100 MCG/2ML IJ SOLN
INTRAMUSCULAR | Status: AC
  Filled 2014-09-13: qty 2

## 2014-09-13 MED ORDER — HEPARIN (PORCINE) IN NACL 2-0.9 UNIT/ML-% IJ SOLN
INTRAMUSCULAR | Status: AC
  Filled 2014-09-13: qty 500

## 2014-09-13 MED ORDER — CEFAZOLIN SODIUM-DEXTROSE 2-3 GM-% IV SOLR
INTRAVENOUS | Status: AC
  Filled 2014-09-13: qty 50

## 2014-09-13 MED ORDER — FENTANYL CITRATE (PF) 100 MCG/2ML IJ SOLN
INTRAMUSCULAR | Status: DC | PRN
  Administered 2014-09-13: 50 ug via INTRAVENOUS
  Administered 2014-09-13: 25 ug via INTRAVENOUS

## 2014-09-13 MED ORDER — SODIUM CHLORIDE 0.9 % IV SOLN: INTRAVENOUS | Status: DC

## 2014-09-13 MED ORDER — MIDAZOLAM HCL 5 MG/5ML IJ SOLN
INTRAMUSCULAR | Status: DC | PRN
  Administered 2014-09-13: 2 mg via INTRAVENOUS
  Administered 2014-09-13: 1 mg via INTRAVENOUS

## 2014-09-13 MED ORDER — MUPIROCIN 2 % EX OINT
1.0000 "application " | TOPICAL_OINTMENT | Freq: Once | CUTANEOUS | Status: AC
  Administered 2014-09-13: 1 via TOPICAL

## 2014-09-13 MED ORDER — MUPIROCIN 2 % EX OINT
TOPICAL_OINTMENT | CUTANEOUS | Status: AC
  Administered 2014-09-13: 1 via TOPICAL
  Filled 2014-09-13: qty 22

## 2014-09-13 MED ORDER — CEFAZOLIN SODIUM-DEXTROSE 2-3 GM-% IV SOLR
2.0000 g | INTRAVENOUS | Status: AC
  Administered 2014-09-13: 2 g via INTRAVENOUS

## 2014-09-13 MED ORDER — CEFAZOLIN SODIUM 1 G IJ SOLR
2.0000 g | INTRAMUSCULAR | Status: DC | PRN
  Administered 2014-09-13 (×2): 2 g via INTRAVENOUS

## 2014-09-13 MED ORDER — ACETAMINOPHEN 325 MG PO TABS
325.0000 mg | ORAL_TABLET | ORAL | Status: DC | PRN
  Filled 2014-09-13: qty 2

## 2014-09-13 SURGICAL SUPPLY — 5 items
CABLE SURGICAL S-101-97-12 (CABLE) ×2 IMPLANT
PACEMAKER ADAPTA DR ADDRL1 (Pacemaker) ×1 IMPLANT
PAD DEFIB LIFELINK (PAD) ×2 IMPLANT
PPM ADAPTA DR ADDRL1 (Pacemaker) ×2 IMPLANT
TRAY PACEMAKER INSERTION (CUSTOM PROCEDURE TRAY) ×2 IMPLANT

## 2014-09-13 NOTE — Discharge Instructions (Signed)
Pacemaker Battery Change °A pacemaker battery usually lasts 4 to 12 years. Once or twice per year, you will be asked to visit your health care provider to have a full evaluation of your pacemaker. When a battery needs to be replaced, the entire pacemaker is replaced so that you can benefit from new circuitry and any new features that have been added to pacemakers. Most often, this procedure is very simple because the leads are already in place.  °There are many things that affect how long a pacemaker battery will last, including:  °· The age of the pacemaker.   °· The number of leads (1, 2, or 3).   °· The pacemaker work load. If the pacemaker is helping the heart more often, the battery will not last as long as it would if the pacemaker did not need to help the heart.   °· Power (voltage) settings.  °LET YOUR HEALTH CARE PROVIDER KNOW ABOUT:  °· Any allergies you have.   °· All medicines you are taking, including vitamins, herbs, eye drops, creams, and over-the-counter medicines.   °· Previous problems you or members of your family have had with the use of anesthetics.   °· Any blood disorders you have.   °· Previous surgeries you have had, especially since your last pacemaker placement.   °· Medical conditions you have.   °· Possibility of pregnancy, if this applies. °· Symptoms of chest pain, trouble breathing, palpitations, light-headedness, or feelings of an abnormal or irregular heartbeat. °RISKS AND COMPLICATIONS  °Generally, this is a safe procedure. However, as with any procedure, problems can occur and include:  °· Bleeding.   °· Bruising of the skin around where the incision was made.   °· Pain at the incision site.   °· Pulling apart of the skin at the incision site.   °· Infection.   °· Allergic reaction to anesthetics or other medicines used during the procedure.   °People with diabetes may have a temporary increase in their blood sugar after any surgical procedure.  °BEFORE THE PROCEDURE  °· Wash all  of the skin around the area of the chest where the pacemaker is located.   °· Ask your health care provider for help with any medicine adjustments before the pacemaker is replaced.   °· Do not eat or drink anything after midnight on the night before the procedure or as directed by your health care provider. °· Ask your health care provider if you can take a sip of water with any approved medicines the morning of the procedure. °PROCEDURE  °· After giving medicine to numb the skin (local anesthetic), your health care provider will make a cut to reopen the pocket holding the pacemaker.   °· The old pacemaker will be disconnected from its leads.   °· The leads will be tested.   °· If needed, the leads will be replaced. If the leads are functioning properly, the new pacemaker may be connected to the existing leads. °· A heart monitor and the pacemaker programmer will be used to make sure that the new pacemaker is working properly. °· The incision site will then be closed. A dressing will be placed over the pacemaker site. The dressing will be removed 24-48 hours afterward. °AFTER THE PROCEDURE  °· You will be taken to a recovery area after the new pacemaker implant is completed. Your vital signs such as blood pressure, heart rate, breathing, and oxygen levels will be monitored. °· Your health care provider will tell you when you will need to next test your pacemaker or when to return to the office for follow-up   for removal of stitches. °Document Released: 07/23/2006 Document Revised: 08/29/2013 Document Reviewed: 10/27/2012 °[employer]® Patient Information ©2015 [employer]. This information is not intended to replace advice given to you by your health care provider. Make sure you discuss any questions you have with your health care provider. ° °

## 2014-09-13 NOTE — Interval H&P Note (Signed)
History and Physical Interval Note:  09/13/2014 8:32 AM  [name]  has presented today for surgery, with the diagnosis of ERI  The various methods of treatment have been discussed with the patient and family. After consideration of risks, benefits and other options for treatment, the patient has consented to  Procedure(s): PPM Generator Changeout (N/A) as a surgical intervention .  [name] history has been reviewed, patient examined, no change in status, stable for surgery.  I have reviewed the patient's chart and labs.  Questions were answered to the patient's satisfaction.     Kyle Avila

## 2014-09-13 NOTE — H&P (View-Only) (Signed)
Patient Care Team: [name], MD as PCP - General (Internal Medicine) Fleet Contras, MD as Consulting Physician (Nephrology) [name], MD (Cardiology) Deboraha Sprang, MD (Cardiology)   HPI  Kyle Avila is a 69 y.o. 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   He has a complex cardiovascular history with bypass surgery and redo bypass in 96 and revascularization with stenting of the circumflex in 2009. At that time he had total LAD and RCA disease; his left ventricular function then was okay.   Myoview 11/15 >>Intermediate risk stress nuclear study with a fixed medium-sized, severe basal to mid inferolateral and basal to mid anterolateral perfusion defect. This suggests prior infarction without significant ischemia. Study rated intermediate risk due to low EF at 42%.Marland Kitchen  He reverted to VVI couple of months ago. This is been associated with significant sluggishness but no chest pain or shortness of breath.   Past Medical History  Diagnosis Date  . Mitral insufficiency   . CAD (coronary artery disease)     s/p CABG  . Heart block   . Gout   . HTN (hypertension)   . Hyperlipidemia   . Chronic renal insufficiency   . 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 Carotid artery occlusion   . Hx of cardiovascular stress test     Adenosine Myoview (11/15):  Inferolateral and anterolateral infarct without significant ischemia, EF 42% - Intermediate Risk    Past Surgical History  Procedure Laterality Date  . Coronary artery bypass graft      with redo cabg  . Cardiac catheterization    . Pacemaker insertion  2007  . Carotid endarterectomy  2002    Current Outpatient Prescriptions  Medication Sig Dispense Refill  . allopurinol (ZYLOPRIM) 100 MG tablet Take 1 tablet by mouth daily.    Marland Kitchen amLODipine (NORVASC) 5 MG tablet Take 1 tablet (5 mg total) by mouth daily. 90 tablet 3  . aspirin EC 81 MG EC tablet Take 81 mg  by mouth daily.  1 tablet 0  . atorvastatin (LIPITOR) 80 MG tablet Take 40 mg by mouth daily.    . clopidogrel (PLAVIX) 75 MG tablet Take 1 tablet by mouth  daily 90 tablet 3  . colchicine (COLCRYS) 0.6 MG tablet Take 0.6 mg by mouth daily as needed (pain).     Marland Kitchen HYDROcodone-acetaminophen (NORCO) 7.5-325 MG per tablet Take 1 tablet by mouth every 4 (four) hours as needed (pain).     . isosorbide mononitrate (IMDUR) 60 MG 24 hr tablet Take 1 tablet by mouth  daily 90 tablet 3  . ketoconazole (NIZORAL) 2 % cream Apply 1 application topically 2 (two) times daily as needed for irritation.     Marland Kitchen losartan (COZAAR) 100 MG tablet Take 1 tablet by mouth  twice a day 180 tablet 2  . meclizine (ANTIVERT) 25 MG tablet Take 25 mg by mouth 2 (two) times daily as needed for dizziness or nausea.     . metoprolol succinate (TOPROL-XL) 50 MG 24 hr tablet Take 1 tablet (50 mg total) by mouth 2 (two) times daily. 180 tablet 3  . nitroGLYCERIN (NITROSTAT) 0.4 MG SL tablet Place 1 tablet (0.4 mg total) under the tongue every 5 (five) minutes as needed for chest pain. 90 tablet 0  . ranolazine (RANEXA) 500 MG 12 hr tablet Take 2 tablets in the morning and 1 tablet in the evening 180 tablet 3  . tamsulosin (FLOMAX) 0.4  MG CAPS Take 0.4 mg by mouth once daily    . traMADol (ULTRAM) 50 MG tablet Take 50 mg by mouth every 6 (six) hours as needed (pain).     . Triamcinolone Acetonide (NASACORT AQ NA) Place 2 sprays into the nose daily.     No current facility-administered medications for this visit.    No Known Allergies  Review of Systems negative except from HPI and PMH  Physical Exam BP 130/68 mmHg  Pulse 65  Ht 5\' 9"  (1.753 m)  Wt 169 lb 9.6 oz (76.93 kg)  BMI 25.03 kg/m2 Well developed and nourished in no acute distress HENT normal Neck supple with JVP-flat Clear Regular rate and rhythm, no murmurs or gallops Abd-soft with active BS No Clubbing cyanosis edema Skin-warm and dry A & Oriented  Grossly  normal sensory and motor function Device pocket well healed; without hematoma or erythema.  There is no tethering   ECG demonstrates sinus rhythm at a heart rate of 45 with underlying ventricular pacing at 65  Assessment and  Plan  Ischemic cardiomyopathy with prior MI  Hypertension -well-controlled  Pacemaker-Medtronic [name] device was interrogated.  The information was reviewed. No changes were made in the programming.     HighGrade heart block device dependent  He unfortunately reverted to VVI shortly after his last visit.  We have reviewed the benefits and risks of generator replacement.  These include but are not limited to lead fracture and infection.  The patient understands, agrees and is willing to proceed.

## 2014-09-14 MED FILL — Heparin Sodium (Porcine) 2 Unit/ML in Sodium Chloride 0.9%: INTRAMUSCULAR | Qty: 500 | Status: AC

## 2014-09-14 MED FILL — Lidocaine HCl Local Preservative Free (PF) Inj 1%: INTRAMUSCULAR | Qty: 30 | Status: AC

## 2014-09-15 ENCOUNTER — Telehealth: Payer: Self-pay | Admitting: Internal Medicine

## 2014-09-15 ENCOUNTER — Telehealth: Payer: Self-pay | Admitting: *Deleted

## 2014-09-15 ENCOUNTER — Other Ambulatory Visit: Payer: Self-pay | Admitting: *Deleted

## 2014-09-15 NOTE — Telephone Encounter (Signed)
New message      Question about device post op care

## 2014-09-15 NOTE — Telephone Encounter (Signed)
Verbal instructions given on bandage removal and post op bathing. Patient voiced understanding. Discharge instructions to be printed and given to pt per Trinidad Curet, RN.

## 2014-09-15 NOTE — Telephone Encounter (Signed)
called to give labs results, per request putting a copy up front for pt to pick up, also gave the call to device to instruct pt on how to remove bandage, per pt request.

## 2014-09-15 NOTE — Telephone Encounter (Signed)
Printed post care generator change instructions for patient and left at front desk for him to pick up.

## 2014-09-27 ENCOUNTER — Ambulatory Visit (INDEPENDENT_AMBULATORY_CARE_PROVIDER_SITE_OTHER): Payer: Commercial Managed Care - HMO | Admitting: *Deleted

## 2014-09-27 DIAGNOSIS — Z950 Presence of cardiac pacemaker: Secondary | ICD-10-CM | POA: Diagnosis not present

## 2014-09-27 DIAGNOSIS — I4430 Unspecified atrioventricular block: Secondary | ICD-10-CM

## 2014-09-27 LAB — CUP PACEART INCLINIC DEVICE CHECK
Battery Impedance: 100 Ohm
Battery Remaining Longevity: 132 mo
Battery Voltage: 2.79 V
Brady Statistic AP VP Percent: 57 %
Brady Statistic AP VS Percent: 0 %
Brady Statistic AS VP Percent: 43 %
Brady Statistic AS VS Percent: 0 %
Date Time Interrogation Session: 20160601161602
Lead Channel Impedance Value: 520 Ohm
Lead Channel Pacing Threshold Amplitude: 1 V
Lead Channel Setting Pacing Pulse Width: 0.4 ms
Lead Channel Setting Sensing Sensitivity: 2.8 mV
MDC IDC MSMT LEADCHNL RA PACING THRESHOLD AMPLITUDE: 0.5 V
MDC IDC MSMT LEADCHNL RA PACING THRESHOLD PULSEWIDTH: 0.4 ms
MDC IDC MSMT LEADCHNL RA SENSING INTR AMPL: 2 mV
MDC IDC MSMT LEADCHNL RV IMPEDANCE VALUE: 447 Ohm
MDC IDC MSMT LEADCHNL RV PACING THRESHOLD PULSEWIDTH: 0.4 ms
MDC IDC SET LEADCHNL RA PACING AMPLITUDE: 2 V
MDC IDC SET LEADCHNL RV PACING AMPLITUDE: 2.5 V

## 2014-09-27 NOTE — Progress Notes (Signed)
Changeout wound check appointment. Dermabond present, no steri-strips. Wound without redness or edema. Incision edges approximated, wound well healed. Normal device function. Thresholds, sensing, and impedances consistent with implant measurements. Device programmed at chronic output settings. Histogram distribution appropriate for patient and level of activity---pt mostly sedentary since [employer]. No mode switches or high ventricular rates noted. Patient educated about wound care, arm mobility, lifting restrictions. [facility] 12/27/14 & ROV w/ SK in 35mo (per pt request).

## 2014-10-23 ENCOUNTER — Encounter: Payer: Self-pay | Admitting: Internal Medicine

## 2014-10-26 ENCOUNTER — Other Ambulatory Visit (INDEPENDENT_AMBULATORY_CARE_PROVIDER_SITE_OTHER): Payer: Commercial Managed Care - HMO | Admitting: *Deleted

## 2014-10-26 DIAGNOSIS — E785 Hyperlipidemia, unspecified: Secondary | ICD-10-CM | POA: Diagnosis not present

## 2014-10-26 LAB — LIPID PANEL
CHOLESTEROL: 163 mg/dL (ref 0–200)
HDL: 45.4 mg/dL (ref >39.00)
LDL Cholesterol: 92 mg/dL (ref 0–99)
NONHDL: 117.6
TRIGLYCERIDES: 126 mg/dL (ref 0.0–149.0)
Total CHOL/HDL Ratio: 4
VLDL: 25.2 mg/dL (ref 0.0–40.0)

## 2014-10-26 LAB — CK: Total CK: 269 U/L — ABNORMAL HIGH (ref 7–232)

## 2014-10-26 LAB — HEPATIC FUNCTION PANEL
ALBUMIN: 3.7 g/dL (ref 3.5–5.2)
ALT: 27 U/L (ref 0–53)
AST: 25 U/L (ref 0–37)
Alkaline Phosphatase: 65 U/L (ref 39–117)
Bilirubin, Direct: 0.1 mg/dL (ref 0.0–0.3)
Total Bilirubin: 0.6 mg/dL (ref 0.2–1.2)
Total Protein: 6.3 g/dL (ref 6.0–8.3)

## 2014-10-27 ENCOUNTER — Other Ambulatory Visit: Payer: Commercial Managed Care - HMO

## 2014-10-31 ENCOUNTER — Telehealth: Payer: Self-pay | Admitting: Cardiovascular Disease

## 2014-10-31 NOTE — Telephone Encounter (Signed)
I will review treatment options with him tomorrow at his OV

## 2014-10-31 NOTE — Telephone Encounter (Signed)
I will route this to Dr Burt Knack to make him aware of the pt's concerns. The pt has a pending appointment on 11/01/14.

## 2014-10-31 NOTE — Telephone Encounter (Signed)
New Message  Pt c/o medication issue:  1. Name of Medication: Nitroglycerin   2. How are you currently taking this medication (dosage and times per day)? He states that he is currently taking it more than usual (No further details)    4. What is your medication issue? Pt states that he is taking the medication more now than usual and would like for Dr. Burt Knack and Dr. Caryl Comes to discuss this before his appt on 07/06 so that when he leaves he will have a plan on how to stop taking his nitro's so often.

## 2014-11-01 ENCOUNTER — Encounter: Payer: Self-pay | Admitting: Cardiovascular Disease

## 2014-11-01 ENCOUNTER — Ambulatory Visit (INDEPENDENT_AMBULATORY_CARE_PROVIDER_SITE_OTHER): Payer: Commercial Managed Care - HMO | Admitting: Cardiovascular Disease

## 2014-11-01 VITALS — BP 136/72 | HR 65 | Ht 68.0 in | Wt 169.0 lb

## 2014-11-01 DIAGNOSIS — I25119 Atherosclerotic heart disease of native coronary artery with unspecified angina pectoris: Secondary | ICD-10-CM | POA: Diagnosis not present

## 2014-11-01 LAB — CBC
HCT: 44.5 % (ref 39.0–52.0)
Hemoglobin: 15 g/dL (ref 13.0–17.0)
MCHC: 33.6 g/dL (ref 30.0–36.0)
MCV: 100.1 fl — AB (ref 78.0–100.0)
Platelets: 165 10*3/uL (ref 150.0–400.0)
RBC: 4.45 Mil/uL (ref 4.22–5.81)
RDW: 13.3 % (ref 11.5–15.5)
WBC: 4.9 10*3/uL (ref 4.0–10.5)

## 2014-11-01 LAB — BASIC METABOLIC PANEL
BUN: 34 mg/dL — ABNORMAL HIGH (ref 6–23)
CALCIUM: 9.6 mg/dL (ref 8.4–10.5)
CO2: 33 mEq/L — ABNORMAL HIGH (ref 19–32)
CREATININE: 2.07 mg/dL — AB (ref 0.40–1.50)
Chloride: 103 mEq/L (ref 96–112)
GFR: 33.92 mL/min — AB (ref >60.00)
GLUCOSE: 96 mg/dL (ref 70–99)
POTASSIUM: 4.9 meq/L (ref 3.5–5.1)
SODIUM: 140 meq/L (ref 135–145)

## 2014-11-01 LAB — PROTIME-INR
INR: 1 ratio (ref 0.8–1.0)
Prothrombin Time: 10.8 s (ref 9.6–13.1)

## 2014-11-01 NOTE — Progress Notes (Signed)
Cardiology Office Note Date:  11/01/2014   ID:  Kyle Avila, DOB 02/14/1946, MRN [id number]  PCP:  Tivis Ringer, MD  Cardiologist:  [name], MD    Chief Complaint  Patient presents with  . Chest Pain   History of Present Illness: Kyle Avila is a 69 y.o. male who presents for follow-up evaluation. The patient is followed for coronary artery disease. He underwent initial CABG in 1988 and redo CABG in 1996. He's had remote myocardial infarction. His most recent cardiac catheterization in 2009 demonstrated severe disease in the left circumflex and he was treated with overlapping drug-eluting stents.  The patient has remained physically active over the years. He has always had some exertional angina with high-level activities such as snow skiing. However, since he has undergone a pacemaker generator change a few months back, he has noticed worsening anginal symptoms. This past Saturday he took 4 SL NTG because of significant angina. This generally comes on after a meal, but he had angina with walking on flat ground. He has not had resting angina. The patient is concerned that the rate responsiveness feature of his pacemaker has been turned on and that this accounts for his change in anginal threshold.  In review of his records, he presented in November last year with worsening angina. A Myoview showed a primarily fixed defect. He was started on Ranexa with complete resolution of chest discomfort. He has increased the Ranexa dose on his own but hasn't appreciated any difference in anginal threshold. Therefore, he has gone back to 500 mg twice daily. He denies shortness of breath, edema, orthopnea, or PND.  Past Medical History  Diagnosis Date  . Mitral insufficiency   . CAD (coronary artery disease)     s/p CABG  . Heart block   . Gout   . HTN (hypertension)   . Hyperlipidemia   . Chronic renal insufficiency   . 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  Carotid artery occlusion   . Hx of cardiovascular stress test     Adenosine Myoview (11/15):  Inferolateral and anterolateral infarct without significant ischemia, EF 42% - Intermediate Risk    Past Surgical History  Procedure Laterality Date  . Coronary artery bypass graft      with redo cabg  . Cardiac catheterization    . Pacemaker insertion  2007  . Carotid endarterectomy  2002  . Ep implantable device N/A 09/13/2014    Procedure: PPM Generator Changeout;  Surgeon: Deboraha Sprang, MD;  Location: [facility];  Service: Cardiovascular;  Laterality: N/A;    Current Outpatient Prescriptions  Medication Sig Dispense Refill  . allopurinol (ZYLOPRIM) 100 MG tablet Take 50 mg by mouth daily.     Marland Kitchen amLODipine (NORVASC) 5 MG tablet Take 1 tablet (5 mg total) by mouth daily. 90 tablet 3  . aspirin EC 81 MG EC tablet Take 81 mg by mouth daily.  1 tablet 0  . atorvastatin (LIPITOR) 80 MG tablet Take 40 mg by mouth daily.    . clopidogrel (PLAVIX) 75 MG tablet Take 1 tablet by mouth  daily 90 tablet 3  . colchicine (COLCRYS) 0.6 MG tablet Take 0.6 mg by mouth daily as needed (pain).     Marland Kitchen HYDROcodone-acetaminophen (NORCO) 7.5-325 MG per tablet Take 1 tablet by mouth every 4 (four) hours as needed (pain).     . isosorbide mononitrate (IMDUR) 60 MG 24 hr tablet Take 1 tablet by mouth  daily 90 tablet 3  .  ketoconazole (NIZORAL) 2 % cream Apply 1 application topically 2 (two) times daily as needed for irritation.     Marland Kitchen losartan (COZAAR) 100 MG tablet Take 1 tablet by mouth  twice a day 180 tablet 2  . meclizine (ANTIVERT) 25 MG tablet Take 25 mg by mouth 2 (two) times daily as needed for dizziness or nausea.     . metoprolol succinate (TOPROL-XL) 50 MG 24 hr tablet Take 50 mg by mouth 2 (two) times daily. Take with or immediately following a meal.    . nitroGLYCERIN (NITROSTAT) 0.4 MG SL tablet Place 1 tablet (0.4 mg total) under the tongue every 5 (five) minutes as needed for chest pain. 90  tablet 0  . ranolazine (RANEXA) 500 MG 12 hr tablet Take 500 mg by mouth 2 (two) times daily.    . tamsulosin (FLOMAX) 0.4 MG CAPS Take 0.4 mg by mouth once daily    . traMADol (ULTRAM) 50 MG tablet Take 50 mg by mouth every 6 (six) hours as needed (pain).     . Triamcinolone Acetonide (NASACORT AQ NA) Place 2 sprays into the nose as needed.      No current facility-administered medications for this visit.    Allergies:   Review of patient's allergies indicates no known allergies.   Social History:  The patient  reports that he has never smoked. He has never used smokeless tobacco. He reports that he drinks alcohol. He reports that he does not use illicit drugs.   Family History:  [name]  family history includes Cancer in his mother; Heart attack in his father; Heart disease in his father.    ROS:  Please see the history of present illness.  Otherwise, review of systems is positive for chest pain, back pain, difficulty urinating.  All other systems are reviewed and negative.    PHYSICAL EXAM: VS:  BP 136/72 mmHg  Pulse 65  Ht 5\' 8"  (1.727 m)  Wt 169 lb (76.658 kg)  BMI 25.70 kg/m2 , BMI Body mass index is 25.7 kg/(m^2). GEN: Well nourished, well developed, in no acute distress HEENT: normal Neck: no JVD, no masses. Soft bilateral carotid bruits Cardiac: RRR without murmur or gallop                Respiratory:  clear to auscultation bilaterally, normal work of breathing GI: soft, nontender, nondistended, + BS MS: no deformity or atrophy Ext: no pretibial edema, pedal pulses 2+= bilaterally Skin: warm and dry, no rash Neuro:  Strength and sensation are intact Psych: euthymic mood, full affect  EKG:  EKG is not ordered today. EKG from 09/04/14 showed a paced rhythm 60 bpm.  Recent Labs: 09/04/2014: BUN 44*; Creatinine, Ser 2.06*; Hemoglobin 14.4; Platelets 148.0*; Potassium 4.7; Sodium 138 10/26/2014: ALT 27   Lipid Panel     Component Value Date/Time   CHOL 163  10/26/2014 0744   TRIG 126.0 10/26/2014 0744   HDL 45.40 10/26/2014 0744   CHOLHDL 4 10/26/2014 0744   VLDL 25.2 10/26/2014 0744   LDLCALC 92 10/26/2014 0744      Wt Readings from Last 3 Encounters:  11/01/14 169 lb (76.658 kg)  09/13/14 162 lb (73.483 kg)  09/04/14 169 lb 9.6 oz (76.93 kg)     Cardiac Studies Reviewed: Myoview Scan 03/08/2014: QPS Raw Data Images: Normal; no motion artifact; normal heart/lung ratio. Stress Images: Medium-sized, severe basal to to mid inferolateral and basal to mid anterolateral perfusion defect. Rest Images: Medium-sized, severe basal to mid  inferolateral and basal to mid anterolateral perfusion defect. Subtraction (SDS): Fixed medium-sized, severe basal to mid inferolateral and basal to mid anterolateral perfusion defect Transient Ischemic Dilatation (Normal <1.22): 1.09 Lung/Heart Ratio (Normal <0.45): 0.26  Quantitative Gated Spect Images QGS EDV: 144 ml QGS ESV: 85 ml  Impression Exercise Capacity: Adenosine study with no exercise. BP Response: Normal blood pressure response. Clinical Symptoms: Chest pain. ECG Impression: V-paced Comparison with Prior Nuclear Study: No significant change from previous study  Overall Impression: Intermediate risk stress nuclear study with a fixed medium-sized, severe basal to mid inferolateral and basal to mid anterolateral perfusion defect. This suggests prior infarction without significant ischemia. Study rated intermediate risk due to low EF at 42%..  LV Ejection Fraction: 42%. LV Wall Motion: Lateral and inferior hypokinesis.   ASSESSMENT AND PLAN: 1.  CAD, native vessel, with exertional angina, CCS functional class III. The patient has progressive anginal symptoms. He has known coronary artery disease with history of redo CABG and PCI. We interrogated his pacemaker today to make sure that his worsening angina is not related to his heart rhythm. There have been no changes in his  pacemaker setting since his device upgrade. 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 The patient is on maximal medical therapy. This includes long-term dual antiplatelets therapy as well as an aggressive antianginal regimen. I have recommended cardiac catheterization and possible PCI.  I have reviewed the risks, indications, and alternatives to cardiac catheterization, possible PTCA and stenting with the patient. Risks include but are not limited to bleeding, infection, vascular injury, stroke, myocardial infection, arrhythmia, kidney injury, radiation-related injury in the case of prolonged fluoroscopy use, emergency cardiac surgery, and death. The patient understands the risks of serious complication is low (123456).  2. CKD, Stage III. The patient has increased risk of contrast-induced nephropathy. He will be advised to push fluids. He will hold his ARB 48 hours before the procedure. Will limit contrast is much as possible. Will bring him in several hours early for precath hydration. If he requires PCI it is possible that this will need to be done in a staged fashion because of his kidney issues.  3. Hyperlipidemia: He will continue on high-dose atorvastatin.  Current medicines are reviewed with the patient today.  The patient does not have concerns regarding medicines.  Labs/ tests ordered today include:  No orders of the defined types were placed in this encounter.    Disposition:   FU pending cath results  Signed, [name], MD  11/01/2014 9:28 AM    [facility] [place], [place], [place]  [address] Phone: [phone]; Fax: [phone]

## 2014-11-01 NOTE — Patient Instructions (Addendum)
Medication Instructions:  Your physician recommends that you continue on your current medications as directed. Please refer to the Current Medication list given to you today.  Labwork: Your physician recommends that you have lab work today: BMP, CBC and PT/INR  Testing/Procedures: Your physician has requested that you have a cardiac catheterization. Cardiac catheterization is used to diagnose and/or treat various heart conditions. Doctors may recommend this procedure for a number of different reasons. The most common reason is to evaluate chest pain. Chest pain can be a symptom of coronary artery disease (CAD), and cardiac catheterization can show whether plaque is narrowing or blocking your heart's arteries. This procedure is also used to evaluate the valves, as well as measure the blood flow and oxygen levels in different parts of your heart. For further information please visit [email or web address]. Please follow instruction sheet, as given.  Follow-Up: Your physician recommends that you schedule a follow-up appointment in: 3 WEEKS with PA/NP    Any Other Special Instructions Will Be Listed Below (If Applicable).

## 2014-11-02 ENCOUNTER — Encounter: Payer: Self-pay | Admitting: Cardiovascular Disease

## 2014-11-03 ENCOUNTER — Encounter (HOSPITAL_COMMUNITY)
Admission: RE | Disposition: A | Discharge status: Home / Self Care | Payer: Commercial Managed Care - HMO | Source: Ambulatory Visit | Attending: Cardiovascular Disease

## 2014-11-03 ENCOUNTER — Ambulatory Visit (HOSPITAL_COMMUNITY)
Admission: RE | Admit: 2014-11-03 | Discharge: 2014-11-04 | Disposition: A | Discharge status: Home / Self Care | Payer: Commercial Managed Care - HMO | Source: Ambulatory Visit | Attending: Cardiovascular Disease | Admitting: Cardiovascular Disease

## 2014-11-03 ENCOUNTER — Encounter (HOSPITAL_COMMUNITY): Payer: Self-pay | Admitting: *Deleted

## 2014-11-03 DIAGNOSIS — I25719 Atherosclerosis of autologous vein coronary artery bypass graft(s) with unspecified angina pectoris: Secondary | ICD-10-CM | POA: Diagnosis not present

## 2014-11-03 DIAGNOSIS — Z955 Presence of coronary angioplasty implant and graft: Secondary | ICD-10-CM | POA: Diagnosis not present

## 2014-11-03 DIAGNOSIS — I739 Peripheral vascular disease, unspecified: Secondary | ICD-10-CM | POA: Diagnosis not present

## 2014-11-03 DIAGNOSIS — Z951 Presence of aortocoronary bypass graft: Secondary | ICD-10-CM

## 2014-11-03 DIAGNOSIS — I25119 Atherosclerotic heart disease of native coronary artery with unspecified angina pectoris: Secondary | ICD-10-CM | POA: Insufficient documentation

## 2014-11-03 DIAGNOSIS — I25118 Atherosclerotic heart disease of native coronary artery with other forms of angina pectoris: Secondary | ICD-10-CM

## 2014-11-03 DIAGNOSIS — I2582 Chronic total occlusion of coronary artery: Secondary | ICD-10-CM | POA: Diagnosis not present

## 2014-11-03 DIAGNOSIS — I10 Essential (primary) hypertension: Secondary | ICD-10-CM | POA: Diagnosis present

## 2014-11-03 DIAGNOSIS — E785 Hyperlipidemia, unspecified: Secondary | ICD-10-CM | POA: Diagnosis not present

## 2014-11-03 DIAGNOSIS — I129 Hypertensive chronic kidney disease with stage 1 through stage 4 chronic kidney disease, or unspecified chronic kidney disease: Secondary | ICD-10-CM | POA: Diagnosis not present

## 2014-11-03 DIAGNOSIS — I209 Angina pectoris, unspecified: Secondary | ICD-10-CM | POA: Diagnosis present

## 2014-11-03 DIAGNOSIS — N183 Chronic kidney disease, stage 3 unspecified: Secondary | ICD-10-CM | POA: Diagnosis present

## 2014-11-03 DIAGNOSIS — N2889 Other specified disorders of kidney and ureter: Secondary | ICD-10-CM | POA: Diagnosis present

## 2014-11-03 DIAGNOSIS — I208 Other forms of angina pectoris: Secondary | ICD-10-CM | POA: Diagnosis present

## 2014-11-03 DIAGNOSIS — I2510 Atherosclerotic heart disease of native coronary artery without angina pectoris: Secondary | ICD-10-CM

## 2014-11-03 DIAGNOSIS — I2089 Other forms of angina pectoris: Secondary | ICD-10-CM | POA: Diagnosis present

## 2014-11-03 DIAGNOSIS — Z950 Presence of cardiac pacemaker: Secondary | ICD-10-CM | POA: Diagnosis present

## 2014-11-03 HISTORY — PX: CORONARY ANGIOPLASTY WITH STENT PLACEMENT: SHX49

## 2014-11-03 HISTORY — PX: CARDIAC CATHETERIZATION: SHX172

## 2014-11-03 HISTORY — DX: Acute myocardial infarction, unspecified: I21.9

## 2014-11-03 HISTORY — DX: Presence of cardiac pacemaker: Z95.0

## 2014-11-03 HISTORY — DX: Other forms of angina pectoris: I20.8

## 2014-11-03 LAB — POCT ACTIVATED CLOTTING TIME: Activated Clotting Time: 356 seconds

## 2014-11-03 SURGERY — CORONARY STENT INTERVENTION

## 2014-11-03 MED ORDER — LORATADINE 10 MG PO TABS
10.0000 mg | ORAL_TABLET | Freq: Every day | ORAL | Status: DC
  Administered 2014-11-03 – 2014-11-04 (×2): 10 mg via ORAL
  Filled 2014-11-03 (×2): qty 1

## 2014-11-03 MED ORDER — METOPROLOL SUCCINATE ER 50 MG PO TB24
50.0000 mg | ORAL_TABLET | Freq: Two times a day (BID) | ORAL | Status: DC
  Administered 2014-11-03 – 2014-11-04 (×2): 50 mg via ORAL
  Filled 2014-11-03 (×2): qty 1

## 2014-11-03 MED ORDER — DIAZEPAM 5 MG PO TABS: 5.0000 mg | ORAL_TABLET | Freq: Four times a day (QID) | ORAL | Status: DC | PRN

## 2014-11-03 MED ORDER — SODIUM CHLORIDE 0.9 % WEIGHT BASED INFUSION
3.0000 mL/kg/h | INTRAVENOUS | Status: DC
Start: 2014-11-03 — End: 2014-11-03

## 2014-11-03 MED ORDER — TRAMADOL HCL 50 MG PO TABS: 50.0000 mg | ORAL_TABLET | Freq: Four times a day (QID) | ORAL | Status: DC | PRN

## 2014-11-03 MED ORDER — RANOLAZINE ER 500 MG PO TB12
500.0000 mg | ORAL_TABLET | Freq: Two times a day (BID) | ORAL | Status: DC
  Administered 2014-11-03 – 2014-11-04 (×2): 500 mg via ORAL
  Filled 2014-11-03 (×2): qty 1

## 2014-11-03 MED ORDER — COLCHICINE 0.6 MG PO TABS: 0.6000 mg | ORAL_TABLET | Freq: Every day | ORAL | Status: DC | PRN

## 2014-11-03 MED ORDER — ALLOPURINOL 100 MG PO TABS
50.0000 mg | ORAL_TABLET | Freq: Every day | ORAL | Status: DC
  Filled 2014-11-03: qty 0.5

## 2014-11-03 MED ORDER — CLOPIDOGREL BISULFATE 75 MG PO TABS
75.0000 mg | ORAL_TABLET | Freq: Every day | ORAL | Status: DC
  Administered 2014-11-04: 09:00:00 75 mg via ORAL
  Filled 2014-11-03: qty 1

## 2014-11-03 MED ORDER — IOHEXOL 350 MG/ML SOLN
INTRAVENOUS | Status: DC | PRN
Start: 2014-11-03 — End: 2014-11-03
  Administered 2014-11-03: 75 mL via INTRACARDIAC

## 2014-11-03 MED ORDER — ASPIRIN EC 81 MG PO TBEC
81.0000 mg | DELAYED_RELEASE_TABLET | Freq: Every day | ORAL | Status: DC
  Filled 2014-11-03: qty 1

## 2014-11-03 MED ORDER — AMLODIPINE BESYLATE 5 MG PO TABS
5.0000 mg | ORAL_TABLET | Freq: Every day | ORAL | Status: DC
  Administered 2014-11-03: 21:00:00 5 mg via ORAL
  Filled 2014-11-03 (×2): qty 1

## 2014-11-03 MED ORDER — ATORVASTATIN CALCIUM 40 MG PO TABS
40.0000 mg | ORAL_TABLET | Freq: Every day | ORAL | Status: DC
  Administered 2014-11-03: 40 mg via ORAL
  Filled 2014-11-03: qty 1

## 2014-11-03 MED ORDER — FENTANYL CITRATE (PF) 100 MCG/2ML IJ SOLN
INTRAMUSCULAR | Status: DC | PRN
  Administered 2014-11-03: 25 ug via INTRAVENOUS

## 2014-11-03 MED ORDER — SODIUM CHLORIDE 0.9 % IV SOLN: INTRAVENOUS | Status: DC

## 2014-11-03 MED ORDER — TAMSULOSIN HCL 0.4 MG PO CAPS
0.4000 mg | ORAL_CAPSULE | Freq: Every day | ORAL | Status: DC
  Administered 2014-11-03: 19:00:00 0.4 mg via ORAL
  Filled 2014-11-03 (×2): qty 1

## 2014-11-03 MED ORDER — SODIUM CHLORIDE 0.9 % IJ SOLN: 3.0000 mL | Freq: Two times a day (BID) | INTRAMUSCULAR | Status: DC

## 2014-11-03 MED ORDER — HYDROCODONE-ACETAMINOPHEN 7.5-325 MG PO TABS: 1.0000 | ORAL_TABLET | ORAL | Status: DC | PRN

## 2014-11-03 MED ORDER — SODIUM CHLORIDE 0.9 % IJ SOLN
INTRAMUSCULAR | Status: DC | PRN
  Administered 2014-11-03: 11:00:00 via INTRA_ARTERIAL

## 2014-11-03 MED ORDER — SODIUM CHLORIDE 0.9 % IV SOLN: 250.0000 mL | INTRAVENOUS | Status: DC | PRN

## 2014-11-03 MED ORDER — ACETAMINOPHEN 325 MG PO TABS: 650.0000 mg | ORAL_TABLET | ORAL | Status: DC | PRN

## 2014-11-03 MED ORDER — HEPARIN SODIUM (PORCINE) 1000 UNIT/ML IJ SOLN
INTRAMUSCULAR | Status: AC
  Filled 2014-11-03: qty 1

## 2014-11-03 MED ORDER — MIDAZOLAM HCL 2 MG/2ML IJ SOLN
INTRAMUSCULAR | Status: DC | PRN
  Administered 2014-11-03: 2 mg via INTRAVENOUS

## 2014-11-03 MED ORDER — SODIUM CHLORIDE 0.9 % IV SOLN
INTRAVENOUS | Status: DC
  Administered 2014-11-03: 08:00:00 via INTRAVENOUS

## 2014-11-03 MED ORDER — ASPIRIN 81 MG PO CHEW
CHEWABLE_TABLET | ORAL | Status: AC
  Filled 2014-11-03: qty 1

## 2014-11-03 MED ORDER — SODIUM CHLORIDE 0.9 % IJ SOLN: 3.0000 mL | INTRAMUSCULAR | Status: DC | PRN

## 2014-11-03 MED ORDER — SODIUM CHLORIDE 0.9 % IJ SOLN
3.0000 mL | INTRAMUSCULAR | Status: DC | PRN
Start: 2014-11-03 — End: 2014-11-03

## 2014-11-03 MED ORDER — VERAPAMIL HCL 2.5 MG/ML IV SOLN
INTRAVENOUS | Status: AC
  Filled 2014-11-03: qty 2

## 2014-11-03 MED ORDER — SODIUM CHLORIDE 0.9 % WEIGHT BASED INFUSION: 1.0000 mL/kg/h | INTRAVENOUS | Status: DC

## 2014-11-03 MED ORDER — LIDOCAINE HCL (PF) 1 % IJ SOLN
INTRAMUSCULAR | Status: DC | PRN
Start: 2014-11-03 — End: 2014-11-03
  Administered 2014-11-03: 2 mL via SUBCUTANEOUS

## 2014-11-03 MED ORDER — ACTIVE PARTNERSHIP FOR HEALTH OF YOUR HEART BOOK
Freq: Once | Status: AC
  Administered 2014-11-03: 23:00:00
  Filled 2014-11-03: qty 1

## 2014-11-03 MED ORDER — HEPARIN (PORCINE) IN NACL 2-0.9 UNIT/ML-% IJ SOLN
INTRAMUSCULAR | Status: AC
  Filled 2014-11-03: qty 1000

## 2014-11-03 MED ORDER — ANGIOPLASTY BOOK
Freq: Once | Status: AC
  Administered 2014-11-03: 23:00:00
  Filled 2014-11-03: qty 1

## 2014-11-03 MED ORDER — LIDOCAINE HCL (PF) 1 % IJ SOLN
INTRAMUSCULAR | Status: AC
  Filled 2014-11-03: qty 30

## 2014-11-03 MED ORDER — SODIUM CHLORIDE 0.9 % IV SOLN
INTRAVENOUS | Status: AC
  Administered 2014-11-03: 12:00:00 via INTRAVENOUS

## 2014-11-03 MED ORDER — ASPIRIN 81 MG PO CHEW
81.0000 mg | CHEWABLE_TABLET | ORAL | Status: AC
  Administered 2014-11-03: 81 mg via ORAL

## 2014-11-03 MED ORDER — MIDAZOLAM HCL 2 MG/2ML IJ SOLN
INTRAMUSCULAR | Status: AC
Start: 2014-11-03 — End: 2014-11-03
  Filled 2014-11-03: qty 2

## 2014-11-03 MED ORDER — HEPARIN SODIUM (PORCINE) 1000 UNIT/ML IJ SOLN
INTRAMUSCULAR | Status: DC | PRN
  Administered 2014-11-03: 5000 [IU] via INTRAVENOUS
  Administered 2014-11-03: 3000 [IU] via INTRAVENOUS

## 2014-11-03 MED ORDER — FENTANYL CITRATE (PF) 100 MCG/2ML IJ SOLN
INTRAMUSCULAR | Status: AC
  Filled 2014-11-03: qty 2

## 2014-11-03 MED ORDER — ONDANSETRON HCL 4 MG/2ML IJ SOLN
4.0000 mg | Freq: Four times a day (QID) | INTRAMUSCULAR | Status: DC | PRN
  Filled 2014-11-03: qty 2

## 2014-11-03 MED ORDER — NITROGLYCERIN 0.4 MG SL SUBL: 0.4000 mg | SUBLINGUAL_TABLET | SUBLINGUAL | Status: DC | PRN

## 2014-11-03 MED ORDER — MECLIZINE HCL 25 MG PO TABS
25.0000 mg | ORAL_TABLET | Freq: Two times a day (BID) | ORAL | Status: DC | PRN
  Filled 2014-11-03: qty 1

## 2014-11-03 MED ORDER — ISOSORBIDE MONONITRATE ER 60 MG PO TB24
60.0000 mg | ORAL_TABLET | Freq: Every day | ORAL | Status: DC
  Administered 2014-11-04: 09:00:00 60 mg via ORAL
  Filled 2014-11-03: qty 1

## 2014-11-03 SURGICAL SUPPLY — 16 items
CATH EXPO 5F MPA-1 (CATHETERS) ×3 IMPLANT
CATH INFINITI 5FR MULTPACK ANG (CATHETERS) ×3 IMPLANT
CATH VISTA GUIDE 6FR MPA1 (CATHETERS) ×3 IMPLANT
DEVICE RAD COMP TR BAND LRG (VASCULAR PRODUCTS) ×3 IMPLANT
DEVICE SPIDERFX EMB PROT 5MM (WIRE) ×3 IMPLANT
GLIDESHEATH SLEND SS 6F .021 (SHEATH) ×3 IMPLANT
KIT ENCORE 26 ADVANTAGE (KITS) ×3 IMPLANT
KIT HEART LEFT (KITS) ×3 IMPLANT
PACK CARDIAC CATHETERIZATION (CUSTOM PROCEDURE TRAY) ×3 IMPLANT
STENT XIENCE ALPINE RX 4.0X15 (Permanent Stent) ×3 IMPLANT
SYR MEDRAD MARK V 150ML (SYRINGE) ×3 IMPLANT
TRANSDUCER W/STOPCOCK (MISCELLANEOUS) ×3 IMPLANT
TUBING CIL FLEX 10 FLL-RA (TUBING) ×3 IMPLANT
VALVE MANIFOLD 3 PORT W/RA/ON (MISCELLANEOUS) ×3 IMPLANT
WIRE COUGAR XT STRL 190CM (WIRE) ×3 IMPLANT
WIRE SAFE-T 1.5MM-J .035X260CM (WIRE) ×3 IMPLANT

## 2014-11-03 NOTE — H&P (View-Only) (Signed)
Cardiology Office Note Date:  11/01/2014   ID:  Kyle Avila, DOB Feb 24, 1946, MRN [id number]  PCP:  Tivis Ringer, MD  Cardiologist:  [name], MD    Chief Complaint  Patient presents with  . Chest Pain   History of Present Illness: Kyle Avila is a 69 y.o. male who presents for follow-up evaluation. The patient is followed for coronary artery disease. He underwent initial CABG in 1988 and redo CABG in 1996. He's had remote myocardial infarction. His most recent cardiac catheterization in 2009 demonstrated severe disease in the left circumflex and he was treated with overlapping drug-eluting stents.  The patient has remained physically active over the years. He has always had some exertional angina with high-level activities such as snow skiing. However, since he has undergone a pacemaker generator change a few months back, he has noticed worsening anginal symptoms. This past Saturday he took 4 SL NTG because of significant angina. This generally comes on after a meal, but he had angina with walking on flat ground. He has not had resting angina. The patient is concerned that the rate responsiveness feature of his pacemaker has been turned on and that this accounts for his change in anginal threshold.  In review of his records, he presented in November last year with worsening angina. A Myoview showed a primarily fixed defect. He was started on Ranexa with complete resolution of chest discomfort. He has increased the Ranexa dose on his own but hasn't appreciated any difference in anginal threshold. Therefore, he has gone back to 500 mg twice daily. He denies shortness of breath, edema, orthopnea, or PND.  Past Medical History  Diagnosis Date  . Mitral insufficiency   . CAD (coronary artery disease)     s/p CABG  . Heart block   . Gout   . HTN (hypertension)   . Hyperlipidemia   . Chronic renal insufficiency   . 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  Carotid artery occlusion   . Hx of cardiovascular stress test     Adenosine Myoview (11/15):  Inferolateral and anterolateral infarct without significant ischemia, EF 42% - Intermediate Risk    Past Surgical History  Procedure Laterality Date  . Coronary artery bypass graft      with redo cabg  . Cardiac catheterization    . Pacemaker insertion  2007  . Carotid endarterectomy  2002  . Ep implantable device N/A 09/13/2014    Procedure: PPM Generator Changeout;  Surgeon: Deboraha Sprang, MD;  Location: [facility];  Service: Cardiovascular;  Laterality: N/A;    Current Outpatient Prescriptions  Medication Sig Dispense Refill  . allopurinol (ZYLOPRIM) 100 MG tablet Take 50 mg by mouth daily.     Marland Kitchen amLODipine (NORVASC) 5 MG tablet Take 1 tablet (5 mg total) by mouth daily. 90 tablet 3  . aspirin EC 81 MG EC tablet Take 81 mg by mouth daily.  1 tablet 0  . atorvastatin (LIPITOR) 80 MG tablet Take 40 mg by mouth daily.    . clopidogrel (PLAVIX) 75 MG tablet Take 1 tablet by mouth  daily 90 tablet 3  . colchicine (COLCRYS) 0.6 MG tablet Take 0.6 mg by mouth daily as needed (pain).     Marland Kitchen HYDROcodone-acetaminophen (NORCO) 7.5-325 MG per tablet Take 1 tablet by mouth every 4 (four) hours as needed (pain).     . isosorbide mononitrate (IMDUR) 60 MG 24 hr tablet Take 1 tablet by mouth  daily 90 tablet 3  .  ketoconazole (NIZORAL) 2 % cream Apply 1 application topically 2 (two) times daily as needed for irritation.     Marland Kitchen losartan (COZAAR) 100 MG tablet Take 1 tablet by mouth  twice a day 180 tablet 2  . meclizine (ANTIVERT) 25 MG tablet Take 25 mg by mouth 2 (two) times daily as needed for dizziness or nausea.     . metoprolol succinate (TOPROL-XL) 50 MG 24 hr tablet Take 50 mg by mouth 2 (two) times daily. Take with or immediately following a meal.    . nitroGLYCERIN (NITROSTAT) 0.4 MG SL tablet Place 1 tablet (0.4 mg total) under the tongue every 5 (five) minutes as needed for chest pain. 90  tablet 0  . ranolazine (RANEXA) 500 MG 12 hr tablet Take 500 mg by mouth 2 (two) times daily.    . tamsulosin (FLOMAX) 0.4 MG CAPS Take 0.4 mg by mouth once daily    . traMADol (ULTRAM) 50 MG tablet Take 50 mg by mouth every 6 (six) hours as needed (pain).     . Triamcinolone Acetonide (NASACORT AQ NA) Place 2 sprays into the nose as needed.      No current facility-administered medications for this visit.    Allergies:   Review of patient's allergies indicates no known allergies.   Social History:  The patient  reports that he has never smoked. He has never used smokeless tobacco. He reports that he drinks alcohol. He reports that he does not use illicit drugs.   Family History:  [name]  family history includes Cancer in his mother; Heart attack in his father; Heart disease in his father.    ROS:  Please see the history of present illness.  Otherwise, review of systems is positive for chest pain, back pain, difficulty urinating.  All other systems are reviewed and negative.    PHYSICAL EXAM: VS:  BP 136/72 mmHg  Pulse 65  Ht 5\' 8"  (1.727 m)  Wt 169 lb (76.658 kg)  BMI 25.70 kg/m2 , BMI Body mass index is 25.7 kg/(m^2). GEN: Well nourished, well developed, in no acute distress HEENT: normal Neck: no JVD, no masses. Soft bilateral carotid bruits Cardiac: RRR without murmur or gallop                Respiratory:  clear to auscultation bilaterally, normal work of breathing GI: soft, nontender, nondistended, + BS MS: no deformity or atrophy Ext: no pretibial edema, pedal pulses 2+= bilaterally Skin: warm and dry, no rash Neuro:  Strength and sensation are intact Psych: euthymic mood, full affect  EKG:  EKG is not ordered today. EKG from 09/04/14 showed a paced rhythm 60 bpm.  Recent Labs: 09/04/2014: BUN 44*; Creatinine, Ser 2.06*; Hemoglobin 14.4; Platelets 148.0*; Potassium 4.7; Sodium 138 10/26/2014: ALT 27   Lipid Panel     Component Value Date/Time   CHOL 163  10/26/2014 0744   TRIG 126.0 10/26/2014 0744   HDL 45.40 10/26/2014 0744   CHOLHDL 4 10/26/2014 0744   VLDL 25.2 10/26/2014 0744   LDLCALC 92 10/26/2014 0744      Wt Readings from Last 3 Encounters:  11/01/14 169 lb (76.658 kg)  09/13/14 162 lb (73.483 kg)  09/04/14 169 lb 9.6 oz (76.93 kg)     Cardiac Studies Reviewed: Myoview Scan 03/08/2014: QPS Raw Data Images: Normal; no motion artifact; normal heart/lung ratio. Stress Images: Medium-sized, severe basal to to mid inferolateral and basal to mid anterolateral perfusion defect. Rest Images: Medium-sized, severe basal to mid  inferolateral and basal to mid anterolateral perfusion defect. Subtraction (SDS): Fixed medium-sized, severe basal to mid inferolateral and basal to mid anterolateral perfusion defect Transient Ischemic Dilatation (Normal <1.22): 1.09 Lung/Heart Ratio (Normal <0.45): 0.26  Quantitative Gated Spect Images QGS EDV: 144 ml QGS ESV: 85 ml  Impression Exercise Capacity: Adenosine study with no exercise. BP Response: Normal blood pressure response. Clinical Symptoms: Chest pain. ECG Impression: V-paced Comparison with Prior Nuclear Study: No significant change from previous study  Overall Impression: Intermediate risk stress nuclear study with a fixed medium-sized, severe basal to mid inferolateral and basal to mid anterolateral perfusion defect. This suggests prior infarction without significant ischemia. Study rated intermediate risk due to low EF at 42%..  LV Ejection Fraction: 42%. LV Wall Motion: Lateral and inferior hypokinesis.   ASSESSMENT AND PLAN: 1.  CAD, native vessel, with exertional angina, CCS functional class III. The patient has progressive anginal symptoms. He has known coronary artery disease with history of redo CABG and PCI. We interrogated his pacemaker today to make sure that his worsening angina is not related to his heart rhythm. There have been no changes in his  pacemaker setting since his device upgrade. 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 The patient is on maximal medical therapy. This includes long-term dual antiplatelets therapy as well as an aggressive antianginal regimen. I have recommended cardiac catheterization and possible PCI.  I have reviewed the risks, indications, and alternatives to cardiac catheterization, possible PTCA and stenting with the patient. Risks include but are not limited to bleeding, infection, vascular injury, stroke, myocardial infection, arrhythmia, kidney injury, radiation-related injury in the case of prolonged fluoroscopy use, emergency cardiac surgery, and death. The patient understands the risks of serious complication is low (123456).  2. CKD, Stage III. The patient has increased risk of contrast-induced nephropathy. He will be advised to push fluids. He will hold his ARB 48 hours before the procedure. Will limit contrast is much as possible. Will bring him in several hours early for precath hydration. If he requires PCI it is possible that this will need to be done in a staged fashion because of his kidney issues.  3. Hyperlipidemia: He will continue on high-dose atorvastatin.  Current medicines are reviewed with the patient today.  The patient does not have concerns regarding medicines.  Labs/ tests ordered today include:  No orders of the defined types were placed in this encounter.    Disposition:   FU pending cath results  Signed, [name], MD  11/01/2014 9:28 AM    [facility] [address] [place], [place], [place]  [address] Phone: [phone]; Fax: [phone]

## 2014-11-03 NOTE — Progress Notes (Signed)
TR BAND REMOVAL  LOCATION:  left radial  DEFLATED PER PROTOCOL:  Yes.    TIME BAND OFF / DRESSING APPLIED:   1830   SITE UPON ARRIVAL:   Level 1  SITE AFTER BAND REMOVAL:  Level 1  REVERSE ALLEN'S TEST:    positive  CIRCULATION SENSATION AND MOVEMENT:  Within Normal Limits  Yes.    COMMENTS:

## 2014-11-03 NOTE — Interval H&P Note (Signed)
History and Physical Interval Note:  11/03/2014 10:46 AM  [name]  has presented today for surgery, with the diagnosis of cad, angina  The various methods of treatment have been discussed with the patient and family. After consideration of risks, benefits and other options for treatment, the patient has consented to  Procedure(s): Left Heart Cath and Cors/Grafts Angiography (N/A) as a surgical intervention .  [name] history has been reviewed, patient examined, no change in status, stable for surgery.  I have reviewed the patient's chart and labs.  Questions were answered to the patient's satisfaction.    Cath Lab Visit (complete for each Cath Lab visit)  Clinical Evaluation Leading to the Procedure:   ACS: No.  Non-ACS:    Anginal Classification: CCS III  Anti-ischemic medical therapy: Maximal Therapy (2 or more classes of medications)  Non-Invasive Test Results: No non-invasive testing performed  Prior CABG: Previous CABG       Kyle Avila

## 2014-11-04 ENCOUNTER — Other Ambulatory Visit: Payer: Self-pay | Admitting: Cardiology

## 2014-11-04 ENCOUNTER — Encounter (HOSPITAL_COMMUNITY): Payer: Self-pay | Admitting: Cardiology

## 2014-11-04 DIAGNOSIS — I25119 Atherosclerotic heart disease of native coronary artery with unspecified angina pectoris: Secondary | ICD-10-CM | POA: Diagnosis not present

## 2014-11-04 DIAGNOSIS — I739 Peripheral vascular disease, unspecified: Secondary | ICD-10-CM | POA: Diagnosis not present

## 2014-11-04 DIAGNOSIS — I129 Hypertensive chronic kidney disease with stage 1 through stage 4 chronic kidney disease, or unspecified chronic kidney disease: Secondary | ICD-10-CM | POA: Diagnosis not present

## 2014-11-04 DIAGNOSIS — I208 Other forms of angina pectoris: Secondary | ICD-10-CM | POA: Diagnosis not present

## 2014-11-04 DIAGNOSIS — N183 Chronic kidney disease, stage 3 unspecified: Secondary | ICD-10-CM | POA: Diagnosis present

## 2014-11-04 DIAGNOSIS — Z951 Presence of aortocoronary bypass graft: Secondary | ICD-10-CM | POA: Diagnosis not present

## 2014-11-04 DIAGNOSIS — I2582 Chronic total occlusion of coronary artery: Secondary | ICD-10-CM | POA: Diagnosis not present

## 2014-11-04 DIAGNOSIS — Z955 Presence of coronary angioplasty implant and graft: Secondary | ICD-10-CM | POA: Diagnosis not present

## 2014-11-04 DIAGNOSIS — I2510 Atherosclerotic heart disease of native coronary artery without angina pectoris: Secondary | ICD-10-CM

## 2014-11-04 DIAGNOSIS — I25719 Atherosclerosis of autologous vein coronary artery bypass graft(s) with unspecified angina pectoris: Secondary | ICD-10-CM | POA: Diagnosis not present

## 2014-11-04 DIAGNOSIS — E785 Hyperlipidemia, unspecified: Secondary | ICD-10-CM | POA: Diagnosis not present

## 2014-11-04 LAB — CBC
HCT: 44.8 % (ref 39.0–52.0)
HEMOGLOBIN: 15.4 g/dL (ref 13.0–17.0)
MCH: 34 pg (ref 26.0–34.0)
MCHC: 34.4 g/dL (ref 30.0–36.0)
MCV: 98.9 fL (ref 78.0–100.0)
Platelets: 144 10*3/uL — ABNORMAL LOW (ref 150–400)
RBC: 4.53 MIL/uL (ref 4.22–5.81)
RDW: 12.7 % (ref 11.5–15.5)
WBC: 5.2 10*3/uL (ref 4.0–10.5)

## 2014-11-04 LAB — BASIC METABOLIC PANEL
Anion gap: 5 (ref 5–15)
BUN: 32 mg/dL — ABNORMAL HIGH (ref 6–20)
CO2: 29 mmol/L (ref 22–32)
Calcium: 8.8 mg/dL — ABNORMAL LOW (ref 8.9–10.3)
Chloride: 105 mmol/L (ref 101–111)
Creatinine, Ser: 1.79 mg/dL — ABNORMAL HIGH (ref 0.61–1.24)
GFR calc Af Amer: 43 mL/min — ABNORMAL LOW (ref >60)
GFR, EST NON AFRICAN AMERICAN: 37 mL/min — AB (ref >60)
Glucose, Bld: 107 mg/dL — ABNORMAL HIGH (ref 65–99)
POTASSIUM: 4.7 mmol/L (ref 3.5–5.1)
SODIUM: 139 mmol/L (ref 135–145)

## 2014-11-04 MED ORDER — ACETAMINOPHEN 325 MG PO TABS: 650.0000 mg | ORAL_TABLET | ORAL | Status: DC | PRN

## 2014-11-04 MED ORDER — LOSARTAN POTASSIUM 100 MG PO TABS: ORAL_TABLET | ORAL | Status: DC

## 2014-11-04 NOTE — Progress Notes (Signed)
CARDIAC REHAB PHASE I   PRE:  Rate/Rhythm: 63 pacing    BP: sitting 163/62    SaO2:   MODE:  Ambulation: 1000 ft   POST:  Rate/Rhythm: 73 pacing    BP: sitting 169/77     SaO2:   Tolerated well without angina at quick pace. Very talkative. Reviewed education. Not interested in CRPII. [id number]   Josephina Shih [place] CES, ACSM 11/04/2014 8:46 AM

## 2014-11-04 NOTE — Discharge Instructions (Signed)
Coronary Angiogram With Stent, Care After Refer to this sheet in the next few weeks. These instructions provide you with information on caring for yourself after your procedure. Your health care provider may also give you more specific instructions. Your treatment has been planned according to current medical practices, but problems sometimes occur. Call your health care provider if you have any problems or questions after your procedure.  WHAT TO EXPECT AFTER THE PROCEDURE  The insertion site may be tender for a few days after your procedure. HOME CARE INSTRUCTIONS   Take medicines only as directed by your health care provider. Blood thinners may be prescribed after your procedure to improve blood flow through the stent.  Change any bandages (dressings) as directed by your health care provider.   Check your insertion site every day for redness, swelling, or fluid leaking from the insertion.   Do not take baths, swim, or use a hot tub until your health care provider approves. You may shower. Pat the insertion area dry. Do not rub the insertion area with a washcloth or towel.   Eat a heart-healthy diet. This should include plenty of fresh fruits and vegetables. Meat should be lean cuts. Avoid the following types of food:   Food that is high in salt.   Canned or highly processed food.   Food that is high in saturated fat or sugar.   Fried food.   Make any other lifestyle changes recommended by your health care provider. This may include:   Not using any tobacco products including cigarettes, chewing tobacco, or electronic cigarettes.  Managing your weight.   Getting regular exercise.   Managing your blood pressure.   Limiting your alcohol intake.   Managing other health problems, such as diabetes.   If you need an MRI after your heart stent was placed, be sure to tell the health care provider who orders the MRI that you have a heart stent.   Keep all follow-up  visits as directed by your health care provider.  SEEK IMMEDIATE MEDICAL CARE IF:   You develop chest pain, shortness of breath, feel faint, or pass out.  You have bleeding, swelling larger than a walnut, or drainage from the catheter insertion site.  You develop pain, discoloration, coldness, or severe bruising in the leg or arm that held the catheter.  You develop bleeding from any other place such as from the bowels. There may be bright red blood in the urine or stools, or it may appear as black, tarry stools.  You have a fever or chills. MAKE SURE YOU:  Understand these instructions.  Will watch your condition.  Will get help right away if you are not doing well or get worse. Document Released: 11/01/2004 Document Revised: 08/29/2013 Document Reviewed: 09/15/2012 [facility] Patient Information 2015 [place], [place]. This information is not intended to replace advice given to you by your health care provider. Make sure you discuss any questions you have with your health care provider.  You should have lab work (BMP) on Tuesday next week to follow up your renal function after cath. (Order has been placed in the computer system).

## 2014-11-04 NOTE — Progress Notes (Signed)
Patient ID: Kyle Avila, male   DOB: 28-Mar-1946, 69 y.o.   MRN: [id number]    Patient Name: Kyle Avila Date of Encounter: 11/04/2014     Active Problems:   Exertional angina    SUBJECTIVE  No chest pain or sob, s/p PCI with stenting of SVG to RCA/PDA.   CURRENT MEDS . allopurinol  50 mg Oral Daily  . amLODipine  5 mg Oral QHS  . aspirin EC  81 mg Oral Daily  . atorvastatin  40 mg Oral q1800  . clopidogrel  75 mg Oral Daily  . isosorbide mononitrate  60 mg Oral Daily  . loratadine  10 mg Oral QHS  . metoprolol succinate  50 mg Oral BID  . ranolazine  500 mg Oral BID  . tamsulosin  0.4 mg Oral QPC supper    OBJECTIVE  Filed Vitals:   11/03/14 2115 11/04/14 0012 11/04/14 0423 11/04/14 0803  BP: 166/70 156/67 153/66 163/62  Pulse:  60 60 62  Temp:  97.1 F (36.2 C) 97.5 F (36.4 C) 98 F (36.7 C)  TempSrc:  Oral Axillary Oral  Resp:  20 19 18   Height:      Weight:  172 lb 2.9 oz (78.1 kg)    SpO2:  96% 93% 94%    Intake/Output Summary (Last 24 hours) at 11/04/14 0805 Last data filed at 11/04/14 0804  Gross per 24 hour  Intake 1803.75 ml  Output   2250 ml  Net -446.25 ml   Filed Weights   11/03/14 0702 11/04/14 0012  Weight: 165 lb (74.844 kg) 172 lb 2.9 oz (78.1 kg)    PHYSICAL EXAM  General: Pleasant, NAD. Neuro: Alert and oriented X 3. Moves all extremities spontaneously. Psych: Normal affect. HEENT:  Normal  Neck: Supple without bruits or JVD. Lungs:  Resp regular and unlabored, CTA. Heart: RRR no s3, s4, or murmurs. Abdomen: Soft, non-tender, non-distended, BS + x 4.  Extremities: No clubbing, cyanosis or edema. DP/PT/Radials 2+ and equal bilaterally.  Accessory Clinical Findings  CBC  Recent Labs  11/01/14 1010 11/04/14 0244  WBC 4.9 5.2  HGB 15.0 15.4  HCT 44.5 44.8  MCV 100.1* 98.9  PLT 165.0 123456*   Basic Metabolic Panel  Recent Labs  11/01/14 1010 11/04/14 0244  NA 140 139  K 4.9 4.7  CL 103 105  CO2 33* 29    GLUCOSE 96 107*  BUN 34* 32*  CREATININE 2.07* 1.79*  CALCIUM 9.6 8.8*   Liver Function Tests No results for input(s): AST, ALT, ALKPHOS, BILITOT, PROT, ALBUMIN in the last 72 hours. No results for input(s): LIPASE, AMYLASE in the last 72 hours. Cardiac Enzymes No results for input(s): CKTOTAL, CKMB, CKMBINDEX, TROPONINI in the last 72 hours. BNP Invalid input(s): POCBNP D-Dimer No results for input(s): DDIMER in the last 72 hours. Hemoglobin A1C No results for input(s): HGBA1C in the last 72 hours. Fasting Lipid Panel No results for input(s): CHOL, HDL, LDLCALC, TRIG, CHOLHDL, LDLDIRECT in the last 72 hours. Thyroid Function Tests No results for input(s): TSH, T4TOTAL, T3FREE, THYROIDAB in the last 72 hours.  Invalid input(s): FREET3  TELE  Ventricular pacing  Radiology/Studies  No results found.  ASSESSMENT AND PLAN  1. Chest pain 2. S/p PCI/Stent SVG 3. Complete heart block, s/p remote PPM  Rec: Ok for discharge home. Usual followup.  Gregg Taylor,M.D.  11/04/2014 8:05 AM

## 2014-11-04 NOTE — Discharge Summary (Signed)
Patient ID: Kyle Avila,  MRN: [id number], DOB/AGE: December 24, 1945 69 y.o.  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 male followed for coronary artery disease. He underwent initial CABG in 1988 and redo CABG in 1996. His most recent cardiac catheterization in 2009 demonstrated severe disease in the left circumflex and he was treated with overlapping drug-eluting stents. The pt was seen in the office 10/31/14 and gave a history of exertional angina, despite good medical Rx. Dr Burt Knack recommended cardiac catheterization and the pt was admitted for this 11/03/14. Cath revealed patent stented segment of the LM into CFX. Patent LIMA-LAD, patent SVG-PDA with severe distal stenosis in the distal portion of the graft. This was treated successfully with a DES. The pt tolerated this well. He has CRI and his ARB was held pre PCI. His SCr at DC is 1.79 (baseline appears to be 2.0). Dr Burt Knack indicated we could resume home dose ARB at discharge. He'll need f/u BMP next week and an OV. In reviewing his home medications I noted the pt has been on Cozaar 100 mg BID (max recommended dose is 100 mg daily). In reviewing his records he appears to have been on this dose for some time, this should be reviewed with Dr Burt Knack when the pt comes in for an OV.   Discharge Vitals:  Blood pressure 163/62, pulse 62, temperature 98 F (36.7 C), temperature source Oral, resp. rate 18, height 5\' 8"  (1.727 m), weight 172 lb 2.9 oz (78.1 kg), SpO2 94 %.     Labs: Results for orders placed or performed during the hospital encounter of 11/03/14 (from the past 24 hour(s))  POCT Activated clotting time     Status: None   Collection Time: 11/03/14 11:37 AM  Result Value Ref Range   Activated Clotting Time 356 seconds  Basic metabolic panel     Status: Abnormal   Collection Time: 11/04/14  2:44 AM  Result Value Ref Range   Sodium 139 135 - 145 mmol/L   Potassium 4.7 3.5 - 5.1 mmol/L   Chloride 105 101 - 111 mmol/L   CO2 29 22 - 32 mmol/L   Glucose, Bld 107 (H) 65 - 99 mg/dL   BUN 32 (H) 6 - 20 mg/dL   Creatinine, Ser 1.79 (H) 0.61 - 1.24 mg/dL   Calcium 8.8 (L) 8.9 - 10.3 mg/dL   GFR calc non Af Amer 37 (L) >60 mL/min   GFR calc Af Amer 43 (L) >60 mL/min   Anion gap 5 5 - 15  CBC     Status: Abnormal   Collection Time: 11/04/14  2:44 AM  Result Value Ref Range   WBC 5.2 4.0 - 10.5 K/uL   RBC 4.53 4.22 - 5.81 MIL/uL   Hemoglobin 15.4 13.0 - 17.0 g/dL   HCT 44.8 39.0 - 52.0 %   MCV 98.9 78.0 - 100.0 fL   MCH 34.0 26.0 - 34.0 pg   MCHC 34.4 30.0 - 36.0 g/dL  RDW 12.7 11.5 - 15.5 %   Platelets 144 (L) 150 - 400 K/uL    Disposition:      Follow-up Information    Follow up with [name], MD.   Specialty:  Cardiology   Why:  office will contact you   Contact information:   [address] [place] [address] [phone]       Discharge Medications:    Medication List    TAKE these medications        acetaminophen 325 MG tablet  Commonly known as:  TYLENOL  Take 2 tablets (650 mg total) by mouth every 4 (four) hours as needed for headache or mild pain.     allopurinol 100 MG tablet  Commonly known as:  ZYLOPRIM  Take 50 mg by mouth daily.     amLODipine 5 MG tablet  Commonly known as:  NORVASC  Take 1 tablet (5 mg total) by mouth daily.     aspirin 81 MG EC tablet  Take 81 mg by mouth daily.     atorvastatin 80 MG tablet  Commonly known as:  LIPITOR  Take 40 mg by mouth daily.      clopidogrel 75 MG tablet  Commonly known as:  PLAVIX  Take 1 tablet by mouth  daily     COLCRYS 0.6 MG tablet  Generic drug:  colchicine  Take 0.6 mg by mouth daily as needed (pain).     HYDROcodone-acetaminophen 7.5-325 MG per tablet  Commonly known as:  NORCO  Take 1 tablet by mouth every 4 (four) hours as needed (pain).     isosorbide mononitrate 60 MG 24 hr tablet  Commonly known as:  IMDUR  Take 1 tablet by mouth  daily     ketoconazole 2 % cream  Commonly known as:  NIZORAL  Apply 1 application topically 2 (two) times daily as needed for irritation.     loratadine 10 MG tablet  Commonly known as:  CLARITIN  Take 10 mg by mouth daily.     losartan 100 MG tablet  Commonly known as:  COZAAR  Take 100 tablet by mouth  twice a day     meclizine 25 MG tablet  Commonly known as:  ANTIVERT  Take 25 mg by mouth 2 (two) times daily as needed for dizziness or nausea.     metoprolol succinate 50 MG 24 hr tablet  Commonly known as:  TOPROL-XL  Take 50 mg by mouth 2 (two) times daily. Take with or immediately following a meal.     NASACORT AQ NA  Place 2 sprays into the nose as needed (for allergies).     nitroGLYCERIN 0.4 MG SL tablet  Commonly known as:  NITROSTAT  Place 1 tablet (0.4 mg total) under the tongue every 5 (five) minutes as needed for chest pain.     ranolazine 500 MG 12 hr tablet  Commonly known as:  RANEXA  Take 500 mg by mouth 2 (two) times daily.     tamsulosin 0.4 MG Caps capsule  Commonly known as:  FLOMAX  Take 0.4 mg by mouth once daily     traMADol 50 MG tablet  Commonly known as:  ULTRAM  Take 50 mg by mouth every 6 (six) hours as needed (pain).         Duration of Discharge Encounter: Greater than 30 minutes including physician time.  Angelena Form PA-C 11/04/2014 9:48 AM   Cardiology Attending  Patient seen and  examined. Agree with above history, data and plan. [place] for discharge. Usual followup as outlined above.  Mikle Bosworth.D.

## 2014-11-06 ENCOUNTER — Telehealth: Payer: Self-pay | Admitting: Cardiovascular Disease

## 2014-11-06 MED FILL — Heparin Sodium (Porcine) 2 Unit/ML in Sodium Chloride 0.9%: INTRAMUSCULAR | Qty: 1000 | Status: AC

## 2014-11-06 NOTE — Telephone Encounter (Signed)
New message      Pt had stent put in on last Friday. Questions about renal test and next appt.

## 2014-11-06 NOTE — Telephone Encounter (Signed)
Message reviewed  Patient needs follow up appointment made post stent  Sent to scheduling

## 2014-11-06 NOTE — Telephone Encounter (Signed)
Called patient left VM that scheduling will call in for follow up appointment (post-procedure).  That his discharge provider looks like her would like to have a BMET (kidney Functions) on 7/12.  I will follow up with both of these on 7/12

## 2014-11-07 ENCOUNTER — Encounter: Payer: Self-pay | Admitting: Cardiovascular Disease

## 2014-11-07 ENCOUNTER — Telehealth: Payer: Self-pay

## 2014-11-07 DIAGNOSIS — N183 Chronic kidney disease, stage 3 (moderate): Secondary | ICD-10-CM | POA: Diagnosis not present

## 2014-11-07 NOTE — Telephone Encounter (Signed)
Patient has a follow up appointment with Kathleen Argue APP PA-C on July 22  Had his BMET drawn today at 930AM

## 2014-11-08 ENCOUNTER — Other Ambulatory Visit: Payer: Self-pay | Admitting: *Deleted

## 2014-11-08 ENCOUNTER — Telehealth: Payer: Self-pay | Admitting: *Deleted

## 2014-11-08 DIAGNOSIS — N183 Chronic kidney disease, stage 3 (moderate): Secondary | ICD-10-CM

## 2014-11-08 NOTE — Telephone Encounter (Signed)
Notes Recorded by Rodman Key, RN on 11/08/2014 at 4:43 PM Reviewed with Truitt Merle, NP ([facility] DOD) Per Cecille Rubin, hold losartan starting now until appointment next Friday with Richardson Dopp.. Will Keep track of blood pressures, will bring records of BPs with him. Repeat BMET at appointment . Pt verbalizes understanding/agreement. He states his BP will go up about 30 points to the 150s without it. He will keep track.

## 2014-11-09 ENCOUNTER — Telehealth: Payer: Self-pay | Admitting: Cardiovascular Disease

## 2014-11-09 NOTE — Telephone Encounter (Signed)
NEw message     Pt want to know if you would you please contact [facility] @ Dr Etheleen Nicks office about my change in medication Dr Mattingly's phone number [phone] Please call to advise

## 2014-11-09 NOTE — Telephone Encounter (Signed)
Routed yesterday's telephone message, of medication change, to Dr. Mercy Moore.

## 2014-11-14 ENCOUNTER — Telehealth: Payer: Self-pay | Admitting: Cardiovascular Disease

## 2014-11-14 MED ORDER — LOSARTAN POTASSIUM 100 MG PO TABS: ORAL_TABLET | ORAL | Status: DC

## 2014-11-14 NOTE — Telephone Encounter (Signed)
Late Entry:  I spoke with the dental office when they called our office.  The pt can have dental cleaning and SBE not required due to recent stent placement.    The dental office put the pt on the phone and he was very upset about his medications being changed during his hospitalization and not having a return phone call in regards to our office touching base with Dr Mercy Moore. I made the pt aware that Dr Burt Knack and I have been out of the office since 11/04/14.   The pt demanded that his medication issue be handled today and they he get a return phone call today.

## 2014-11-14 NOTE — Telephone Encounter (Addendum)
Dr Burt Knack reviewed the pt's chart in regards to Losartan 100mg  twice a day.  This pt's medication was changed at discharge from the hospital to 100mg  daily and then placed on hold due to follow-up lab work.  In reviewing the pt's chart his 01/26/14 note from Dr Mercy Moore shows that he was prescribed losartan 100mg  twice a day during that office visit and his creatinine was 2.13, BUN 39 at that time.  Per Dr Burt Knack the pt can resume losartan 100mg  twice a day. Pt aware.

## 2014-11-14 NOTE — Telephone Encounter (Signed)
Please see follow-up documentation in regards to losartan.

## 2014-11-14 NOTE — Telephone Encounter (Signed)
Agree; thx 

## 2014-11-14 NOTE — Telephone Encounter (Signed)
New message      What dental office are you calling from? [facility] What is your office phone and fax number?  1. What type of procedure is the patient having performed? cleaning  2. What date is procedure scheduled? Pt in chair now  3. What is your question (ex. Antibiotics prior to procedure, holding medication-we need to know how long dentist wants pt to hold med)? Pt had a stent approx 1-2 weeks ago

## 2014-11-14 NOTE — Addendum Note (Signed)
Addended by: Barkley Boards on: 11/14/2014 05:27 PM   Modules accepted: Orders

## 2014-11-16 NOTE — Progress Notes (Signed)
Cardiology Office Note   Date:  11/17/2014   ID:  Kyle, Avila [birth year], MRN [id number]  PCP:  Tivis Ringer, MD  Cardiologist:  Dr. [name]   Electrophysiologist:  Dr. [name]   Chief Complaint  Patient presents with  . Hospitalization Follow-up    [place] >> s/p PCI  . Coronary Artery Disease     History of Present Illness: Kyle Avila is a 69 y.o. male with a hx of CAD status post CABG in 1988 and redo bypass in 1996, heart block status post pacemaker, HTN, HL, CKD, carotid stenosis status post right CEA.  Cardiac catheterization in 2009 demonstrated severe disease in the LCx and he was treated with overlapping drug-eluting stents. He was seen in follow-up 11/01/14 by Dr. Burt Knack. Patient was noted to have exertional angina chronically with high levels of activity. However, the patient noted several months of increasing anginal symptoms. Prior nuclear test in 02/2014 demonstrated primarily fixed defect and he was placed on ranolazine with resolution of chest symptoms. The patient had recently increased ranolazine dose on his own without any difference in anginal threshold. Therefore, he went back to his usual dose of 500 mg twice a day. Cardiac catheterization was recommended.  He was admitted 11/03/14. Cath revealed patent stented segment of the LM into CFX, patent LIMA-LAD, occluded S-OM, patent SVG-PDA with severe distal stenosis in the distal portion of the graft. This was treated successfully with a DES.  His angiotensin receptor blocker was held for his procedure and this was resumed at DC.  His creatinine remained stable.  He was noted to be on an unusual dose of Losartan at 100 mg Twice daily.   Of note, FU labs post DC did demonstrate a Creatinine of 2.  His Losartan was held.  This was reviewed by Dr. [name] and it was felt that his Creatinine was stable and his Losartan was Rx'd by Nephrology at 100 Twice daily.  He returns for FU.  He is here  alone.  Doing well since his PCI.  No further anginal symptoms. He has not taken any NTG since his PCI.  He denies dyspnea, orthopnea, PND, edema.  No syncope.  He would like to come off the Ranolazine.    Studies/Reports Reviewed Today:  LHC 11/03/14 LM:  100% LAD:  Mid to dist 40%, 40% beyond graft insertion LCx:  Ost to mid 20%, stent ok RCA:  Ostial 100% S-RPDA dist 95% >> PCI:  4 x 15 mm Xience DES  S-OM occluded L-LAD ok  Final conclusion:  Severe native three-vessel coronary artery disease with total occlusion of the proximal LAD and total occlusion of the proximal RCA  Continued patency of the stented segment extending from the left mainstem into the left circumflex  Continued patency of the LIMA to LAD graft  Continued patency of the saphenous vein graft to right PDA, with severe stenosis in the distal body of the graft treated successfully with a drug-eluting stent platform using distal embolic protection Recommend continue dual antiplatelets therapy indefinitely with aspirin and clopidogrel. As long as kidney function is stable, anticipate hospital discharge tomorrow. [name] losartan has been held and will be resumed if kidney function is stable.  Myoview 02/2014 Intermediate risk stress nuclear study with a fixed medium-sized, severe basal to mid inferolateral and basal to mid anterolateral perfusion defect. This suggests prior infarction without significant ischemia. Study rated intermediate risk due to low EF at 42%. LV Ejection Fraction: 42%.  LV Wall Motion: Lateral and inferior hypokinesis.   Carotid US [id number] R CEA patent LICA < [id number]   Past Medical History  Diagnosis Date  . Mitral insufficiency   . CAD (coronary artery disease)     s/p CABG twice, PCI twice  . Heart block     s/p PTVDP  . Gout   . HTN (hypertension)   . Hyperlipidemia   . Chronic renal insufficiency     stage 3  . 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 Carotid  artery occlusion     s/p CEA  . Hx of cardiovascular stress test     Adenosine Myoview (11/15):  Inferolateral and anterolateral infarct without significant ischemia, EF 42% - Intermediate Risk  . Exertional angina 11/03/2014  . Myocardial infarction   . Presence of permanent cardiac pacemaker     Past Surgical History  Procedure Laterality Date  . Coronary artery bypass graft      with redo cabg  . Cardiac catheterization    . Pacemaker insertion  2007  . Carotid endarterectomy  2002  . Ep implantable device N/A 09/13/2014    Procedure: PPM Generator Changeout;  Surgeon: Deboraha Sprang, MD;  Location: [place] [facility];  Service: Cardiovascular;  Laterality: N/A;  . Cardiac catheterization N/A 11/03/2014    Procedure: Left Heart Cath and Cors/Grafts Angiography;  Surgeon: [name], MD;  Location: [place] [facility];  Service: Cardiovascular;  Laterality: N/A;  . Cardiac catheterization N/A 11/03/2014    Procedure: Coronary Stent Intervention;  Surgeon: [name], MD;  Location: [place] [facility];  Service: Cardiovascular;  Laterality: N/A;  . Coronary stent placement  11/03/2014    SVG PVA  . Achilles tendon repair       Current Outpatient Prescriptions  Medication Sig Dispense Refill  . allopurinol (ZYLOPRIM) 100 MG tablet Take 50 mg by mouth daily.     Marland Kitchen amLODipine (NORVASC) 5 MG tablet Take 1 tablet (5 mg total) by mouth daily. 90 tablet 3  . aspirin EC 81 MG EC tablet Take 81 mg by mouth daily.  1 tablet 0  . atorvastatin (LIPITOR) 80 MG tablet Take 40 mg by mouth daily.    . clopidogrel (PLAVIX) 75 MG tablet Take 1 tablet by mouth  daily (Patient taking differently: Take 75 mg by mouth once. ) 90 tablet 3  . isosorbide mononitrate (IMDUR) 60 MG 24 hr tablet Take 1 tablet by mouth  daily (Patient taking differently: Take 60 mg by mouth daily. ) 90 tablet 3  . ketoconazole (NIZORAL) 2 % cream Apply 1 application topically 2 (two) times daily as needed for irritation.       Marland Kitchen loratadine (CLARITIN) 10 MG tablet Take 10 mg by mouth daily.    Marland Kitchen losartan (COZAAR) 100 MG tablet Take 100 tablet by mouth  twice a day 180 tablet 2  . metoprolol succinate (TOPROL-XL) 50 MG 24 hr tablet Take 50 mg by mouth 2 (two) times daily. Take with or immediately following a meal.    . nitroGLYCERIN (NITROSTAT) 0.4 MG SL tablet Place 1 tablet (0.4 mg total) under the tongue every 5 (five) minutes as needed for chest pain. 90 tablet 0  . ranolazine (RANEXA) 500 MG 12 hr tablet Take 500 mg by mouth 2 (two) times daily.    . tamsulosin (FLOMAX) 0.4 MG CAPS Take 0.4 mg by mouth once daily    . traMADol (ULTRAM) 50 MG tablet Take 50 mg by mouth every  6 (six) hours as needed (pain).      No current facility-administered medications for this visit.    Allergies:   Review of patient's allergies indicates no known allergies.    Social History:  The patient  reports that he has never smoked. He has never used smokeless tobacco. He reports that he drinks alcohol. He reports that he does not use illicit drugs.   Family History:  [name] family history includes Cancer in his mother; Heart attack in his father; Heart disease in his father.    ROS:   Please see the history of present illness.   Review of Systems  Musculoskeletal: Positive for back pain.  All other systems reviewed and are negative.     PHYSICAL EXAM: VS:  BP 130/80 mmHg  Pulse 54  Wt 170 lb 6.4 oz (77.293 kg)    Wt Readings from Last 3 Encounters:  11/17/14 170 lb 6.4 oz (77.293 kg)  11/04/14 172 lb 2.9 oz (78.1 kg)  11/01/14 169 lb (76.658 kg)     GEN: Well nourished, well developed, in no acute distress HEENT: normal Neck: no JVD, + R CEA scar, no masses Cardiac:  Normal S1/S2, RRR; no murmur ,  no rubs or gallops, trace bilateral LE edema; L wrist without hematoma or mass  Respiratory:  clear to auscultation bilaterally, no wheezing, rhonchi or rales. GI: soft, nontender, nondistended, + BS MS: no  deformity or atrophy Skin: warm and dry  Neuro:  CNs II-XII intact, Strength and sensation are intact Psych: Normal affect   EKG:  EKG is ordered today.  It demonstrates:   AV paced, HR 60   Recent Labs: 10/26/2014: ALT 27 11/04/2014: BUN 32*; Creatinine, Ser 1.79*; Hemoglobin 15.4; Platelets 144*; Potassium 4.7; Sodium 139    Lipid Panel    Component Value Date/Time   CHOL 163 10/26/2014 0744   TRIG 126.0 10/26/2014 0744   HDL 45.40 10/26/2014 0744   CHOLHDL 4 10/26/2014 0744   VLDL 25.2 10/26/2014 0744   LDLCALC 92 10/26/2014 0744      ASSESSMENT AND PLAN:  Coronary artery disease involving native coronary artery of native heart without angina pectoris:  Doing well since his most recent PCI with a DES to the S-RPDA for worsening exertional angina.  He has not had to use NTG since he left the hospital.  We discussed the importance of dual antiplatelet therapy. Continue ASA, Plavix, statin, amlodipine, angiotensin receptor blocker, nitrates, beta-blocker.  I think he can proceed with a trial off Ranolazine.  If he starts to have increasing angina, he should resume the Ranolazine.    CKD (chronic kidney disease), stage 3 (moderate):  Recent Creatinine was stable when compared to previous.  He has already had a FU BMET today.  He is on high dose angiotensin receptor blocker at the direction of his Nephrologist.   Essential hypertension:  Controlled.   Hyperlipidemia:  Continue statin.    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   Carotid stenosis, bilateral:  He stopped getting FU duplex with [facility] due to cost.  He is past due for FU.  Will arrange Carotid US here.  Will see if billing can let him know what the cost will be.     Medication Changes: Current medicines are reviewed at length with the patient today.  Concerns regarding medicines are as outlined above.  The following changes have been made:   Discontinued Medications  ACETAMINOPHEN (TYLENOL) 325 MG TABLET    Take 2 tablets (650 mg total) by mouth every 4 (four) hours as needed for headache or mild pain.   COLCHICINE (COLCRYS) 0.6 MG TABLET    Take 0.6 mg by mouth daily as needed (pain).    HYDROCODONE-ACETAMINOPHEN (NORCO) 7.5-325 MG PER TABLET    Take 1 tablet by mouth every 4 (four) hours as needed (pain).    MECLIZINE (ANTIVERT) 25 MG TABLET    Take 25 mg by mouth 2 (two) times daily as needed for dizziness or nausea.    TRIAMCINOLONE ACETONIDE (NASACORT AQ NA)    Place 2 sprays into the nose as needed (for allergies).    Modified Medications   No medications on file   New Prescriptions   No medications on file     Labs/ tests ordered today include:   Orders Placed This Encounter  Procedures  . EKG 12-Lead     Disposition:   FU with Dr. [name] 2 mos.    Signed, Versie Starks, MHS 11/17/2014 12:26 PM    [facility] [address], [place], [place]  [address] Phone: [phone]; Fax: [phone]

## 2014-11-17 ENCOUNTER — Encounter: Payer: Self-pay | Admitting: Physician Assistant

## 2014-11-17 ENCOUNTER — Other Ambulatory Visit (INDEPENDENT_AMBULATORY_CARE_PROVIDER_SITE_OTHER): Payer: Commercial Managed Care - HMO | Admitting: *Deleted

## 2014-11-17 ENCOUNTER — Ambulatory Visit (INDEPENDENT_AMBULATORY_CARE_PROVIDER_SITE_OTHER): Payer: Commercial Managed Care - HMO | Admitting: Physician Assistant

## 2014-11-17 VITALS — BP 130/80 | HR 54 | Wt 170.4 lb

## 2014-11-17 DIAGNOSIS — I6523 Occlusion and stenosis of bilateral carotid arteries: Secondary | ICD-10-CM

## 2014-11-17 DIAGNOSIS — N183 Chronic kidney disease, stage 3 (moderate): Secondary | ICD-10-CM

## 2014-11-17 DIAGNOSIS — I4430 Unspecified atrioventricular block: Secondary | ICD-10-CM

## 2014-11-17 DIAGNOSIS — Z950 Presence of cardiac pacemaker: Secondary | ICD-10-CM

## 2014-11-17 DIAGNOSIS — I2510 Atherosclerotic heart disease of native coronary artery without angina pectoris: Secondary | ICD-10-CM | POA: Diagnosis not present

## 2014-11-17 DIAGNOSIS — E785 Hyperlipidemia, unspecified: Secondary | ICD-10-CM | POA: Diagnosis not present

## 2014-11-17 DIAGNOSIS — I10 Essential (primary) hypertension: Secondary | ICD-10-CM | POA: Diagnosis not present

## 2014-11-17 LAB — BASIC METABOLIC PANEL
BUN: 38 mg/dL — ABNORMAL HIGH (ref 6–23)
CO2: 30 mEq/L (ref 19–32)
Calcium: 9.2 mg/dL (ref 8.4–10.5)
Chloride: 103 mEq/L (ref 96–112)
Creatinine, Ser: 2.02 mg/dL — ABNORMAL HIGH (ref 0.40–1.50)
GFR: 34.89 mL/min — ABNORMAL LOW (ref >60.00)
Glucose, Bld: 94 mg/dL (ref 70–99)
Potassium: 4.7 mEq/L (ref 3.5–5.1)
Sodium: 139 mEq/L (ref 135–145)

## 2014-11-17 NOTE — Addendum Note (Signed)
Addended by: Eulis Foster on: 11/17/2014 09:12 AM   Modules accepted: Orders

## 2014-11-17 NOTE — Patient Instructions (Signed)
Medication Instructions:  HOLD RANEXA AND GRADUALLY INCREASE ACTIVITY; IF NO ANGINA THEN OK TO STOP RANEXA OR IF MORE ANGINA THEN RESUME RANEXA   Labwork: NONE  Testing/Procedures: Your physician has requested that you have a carotid duplex. This test is an ultrasound of the carotid arteries in your neck. It looks at blood flow through these arteries that supply the brain with blood. Allow one hour for this exam. There are no restrictions or special instructions.   Follow-Up: DR. Burt Knack ON 02/05/15 @ 8:15  Any Other Special Instructions Will Be Listed Below (If Applicable).

## 2014-11-20 ENCOUNTER — Telehealth: Payer: Self-pay | Admitting: *Deleted

## 2014-11-20 NOTE — Telephone Encounter (Signed)
lmom lab work ok, creatinine stable, continue on current tx plan. Any questions cb [phone].

## 2015-01-04 DIAGNOSIS — L570 Actinic keratosis: Secondary | ICD-10-CM | POA: Diagnosis not present

## 2015-01-04 DIAGNOSIS — M7662 Achilles tendinitis, left leg: Secondary | ICD-10-CM | POA: Diagnosis not present

## 2015-01-04 DIAGNOSIS — Z6825 Body mass index (BMI) 25.0-25.9, adult: Secondary | ICD-10-CM | POA: Diagnosis not present

## 2015-01-04 DIAGNOSIS — L989 Disorder of the skin and subcutaneous tissue, unspecified: Secondary | ICD-10-CM | POA: Diagnosis not present

## 2015-01-17 DIAGNOSIS — L218 Other seborrheic dermatitis: Secondary | ICD-10-CM | POA: Diagnosis not present

## 2015-01-17 DIAGNOSIS — C44319 Basal cell carcinoma of skin of other parts of face: Secondary | ICD-10-CM | POA: Diagnosis not present

## 2015-01-17 DIAGNOSIS — L281 Prurigo nodularis: Secondary | ICD-10-CM | POA: Diagnosis not present

## 2015-01-18 DIAGNOSIS — H5315 Visual distortions of shape and size: Secondary | ICD-10-CM | POA: Diagnosis not present

## 2015-01-18 DIAGNOSIS — H25811 Combined forms of age-related cataract, right eye: Secondary | ICD-10-CM | POA: Diagnosis not present

## 2015-01-18 DIAGNOSIS — H35372 Puckering of macula, left eye: Secondary | ICD-10-CM | POA: Diagnosis not present

## 2015-01-18 DIAGNOSIS — H3531 Nonexudative age-related macular degeneration: Secondary | ICD-10-CM | POA: Diagnosis not present

## 2015-02-05 ENCOUNTER — Ambulatory Visit: Payer: Commercial Managed Care - HMO | Admitting: Cardiovascular Disease

## 2015-02-06 NOTE — Progress Notes (Signed)
Cardiology Office Note   Date:  02/07/2015   ID:  CHIRAG DAIGNEAULT, DOB [birth year], MRN [id number]  PCP:  Tivis Ringer, MD  Cardiologist:  Dr. [name]   Electrophysiologist:  Dr. [name]   Chief Complaint  Patient presents with  . Follow-up  . Coronary Artery Disease     History of Present Illness: Kyle Avila is a 69 y.o. male with a hx of CAD status post CABG in 1988 and redo bypass in 1996, heart block status post pacemaker, HTN, HL, CKD, carotid stenosis status post right CEA.  Cardiac catheterization in 2009 demonstrated severe disease in the LCx and he was treated with overlapping drug-eluting stents. He was seen in follow-up 11/01/14 and noted to have exertional angina chronically with high levels of activity. However, the patient noted several months of increasing anginal symptoms. Prior nuclear test in 02/2014 demonstrated primarily fixed defect and he was placed on ranolazine with resolution of chest symptoms. The patient then increased ranolazine dose on his own without any difference in anginal threshold. Therefore, he went back to his usual dose of 500 mg twice a day and Cardiac catheterization was recommended.  LHC 11/03/14 revealed patent stented segment of the LM into CFX, patent LIMA-LAD, occluded S-OM, patent SVG-PDA with severe distal stenosis in the distal portion of the graft. This was treated successfully with a DES.    Last seen in clinic by me 7/16. Returns for follow-up. Overall he is doing well. He does experience angina after walking on the treadmill for 20 minutes. This is overall stable without significant change. He can sometimes go further if he takes a nitroglycerin tablet before exercise. He can do resistance exercises without anginal symptoms. He denies significant dyspnea. He denies syncope, orthopnea, PND or edema.   Studies/Reports Reviewed Today:  LHC 11/03/14 LM:  100% LAD:  Mid to dist 40%, 40% beyond graft insertion LCx:  Ost to  mid 20%, stent ok RCA:  Ostial 100% S-RPDA dist 95% >> PCI:  4 x 15 mm Xience DES  S-OM occluded L-LAD ok  Final conclusion:  Severe native three-vessel coronary artery disease with total occlusion of the proximal LAD and total occlusion of the proximal RCA  Continued patency of the stented segment extending from the left mainstem into the left circumflex  Continued patency of the LIMA to LAD graft  Continued patency of the saphenous vein graft to right PDA, with severe stenosis in the distal body of the graft treated successfully with a drug-eluting stent platform using distal embolic protection Recommend continue dual antiplatelets therapy indefinitely with aspirin and clopidogrel. As long as kidney function is stable, anticipate hospital discharge tomorrow. [name] losartan has been held and will be resumed if kidney function is stable.  Myoview 02/2014 Intermediate risk stress nuclear study with a fixed medium-sized, severe basal to mid inferolateral and basal to mid anterolateral perfusion defect. This suggests prior infarction without significant ischemia. Study rated intermediate risk due to low EF at 42%. LV Ejection Fraction: 42%. LV Wall Motion: Lateral and inferior hypokinesis.   Carotid US [id number] R CEA patent LICA < [id number]   Past Medical History  Diagnosis Date  . Mitral insufficiency   . CAD (coronary artery disease)     s/p CABG twice, PCI twice  . Heart block     s/p PTVDP  . Gout   . HTN (hypertension)   . Hyperlipidemia   . Chronic renal insufficiency     stage 3  .  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 Carotid artery occlusion     s/p CEA  . Hx of cardiovascular stress test     Adenosine Myoview (11/15):  Inferolateral and anterolateral infarct without significant ischemia, EF 42% - Intermediate Risk  . Exertional angina ([place]) 11/03/2014  . Myocardial infarction ([place])   . Presence of permanent cardiac pacemaker     Past Surgical  History  Procedure Laterality Date  . Coronary artery bypass graft      with redo cabg  . Cardiac catheterization    . Pacemaker insertion  2007  . Carotid endarterectomy  2002  . Ep implantable device N/A 09/13/2014    Procedure: PPM Generator Changeout;  Surgeon: Deboraha Sprang, MD;  Location: [facility];  Service: Cardiovascular;  Laterality: N/A;  . Cardiac catheterization N/A 11/03/2014    Procedure: Left Heart Cath and Cors/Grafts Angiography;  Surgeon: [name], MD;  Location: [facility];  Service: Cardiovascular;  Laterality: N/A;  . Cardiac catheterization N/A 11/03/2014    Procedure: Coronary Stent Intervention;  Surgeon: [name], MD;  Location: [facility];  Service: Cardiovascular;  Laterality: N/A;  . Coronary stent placement  11/03/2014    SVG PVA  . Achilles tendon repair       Current Outpatient Prescriptions  Medication Sig Dispense Refill  . allopurinol (ZYLOPRIM) 100 MG tablet Patient takes 50 mg tablet by mouth once daily    . amLODipine (NORVASC) 5 MG tablet Take 1 tablet (5 mg total) by mouth daily. 90 tablet 3  . aspirin EC 81 MG EC tablet Take 81 mg by mouth daily.  1 tablet 0  . atorvastatin (LIPITOR) 40 MG tablet Take 40 mg by mouth daily.    . clopidogrel (PLAVIX) 75 MG tablet Take 1 tablet by mouth  daily (Patient taking differently: Take 75 mg by mouth once. ) 90 tablet 3  . isosorbide mononitrate (IMDUR) 60 MG 24 hr tablet Take 1 tablet by mouth  daily (Patient taking differently: Take 60 mg by mouth daily. ) 90 tablet 3  . ketoconazole (NIZORAL) 2 % cream Apply 1 application topically 2 (two) times daily as needed for irritation.     Marland Kitchen loratadine (CLARITIN) 10 MG tablet Take 10 mg by mouth daily.    Marland Kitchen losartan (COZAAR) 100 MG tablet Take 100 tablet by mouth  twice a day 180 tablet 2  . metoprolol succinate (TOPROL-XL) 50 MG 24 hr tablet Take 50 mg by mouth 2 (two) times daily. Take with or immediately following a meal.    .  nitroGLYCERIN (NITROSTAT) 0.4 MG SL tablet Place 1 tablet (0.4 mg total) under the tongue every 5 (five) minutes as needed for chest pain. 90 tablet 0  . ranolazine (RANEXA) 1000 MG SR tablet Take 1 tablet (1,000 mg total) by mouth 2 (two) times daily. 60 tablet 11  . tamsulosin (FLOMAX) 0.4 MG CAPS Take 0.4 mg by mouth once daily    . traMADol (ULTRAM) 50 MG tablet Take 50 mg by mouth every 6 (six) hours as needed (pain).      No current facility-administered medications for this visit.    Allergies:   Review of patient's allergies indicates no known allergies.    Social History:  The patient  reports that he has never smoked. He has never used smokeless tobacco. He reports that he drinks alcohol. He reports that he does not use illicit drugs.   Family History:  [name] family  history includes Cancer in his mother; Heart attack in his father; Heart disease in his father.    ROS:   Please see the history of present illness.   Review of Systems  Cardiovascular: Positive for chest pain.  Respiratory: Positive for cough.   All other systems reviewed and are negative.     PHYSICAL EXAM: VS:  BP 124/62 mmHg  Pulse 60  Ht 5\' 8"  (1.727 m)  Wt 173 lb 6.4 oz (78.654 kg)  BMI 26.37 kg/m2    Wt Readings from Last 3 Encounters:  02/07/15 173 lb 6.4 oz (78.654 kg)  11/17/14 170 lb 6.4 oz (77.293 kg)  11/04/14 172 lb 2.9 oz (78.1 kg)     GEN: Well nourished, well developed, in no acute distress HEENT: normal Neck: no JVD, + R CEA scar, no masses Cardiac:  Normal S1/S2, RRR; no murmur ,  no rubs or gallops, trace bilateral LE edema Respiratory:  clear to auscultation bilaterally, no wheezing, rhonchi or rales. GI: soft, nontender, nondistended, + BS MS: no deformity or atrophy Skin: warm and dry  Neuro:  CNs II-XII intact, Strength and sensation are intact Psych: Normal affect   EKG:  EKG is  Not ordered today.  It demonstrates:   n/a   Recent Labs: 10/26/2014: ALT  27 11/04/2014: Hemoglobin 15.4; Platelets 144* 11/17/2014: BUN 38*; Creatinine, Ser 2.02*; Potassium 4.7; Sodium 139    Lipid Panel    Component Value Date/Time   CHOL 163 10/26/2014 0744   TRIG 126.0 10/26/2014 0744   HDL 45.40 10/26/2014 0744   CHOLHDL 4 10/26/2014 0744   VLDL 25.2 10/26/2014 0744   LDLCALC 92 10/26/2014 0744      ASSESSMENT AND PLAN:  1. Coronary artery disease involving native coronary artery of native heart with angina pectoris:  Patient is status post CABG in 1988 and redo CABG in 1996. Most recent cardiac catheterization 7/16 performed due to escalating symptoms of angina revealing severe distal graft stenosis in the SVG-PDA which was treated with a DES.  He has stable exertional angina. Continue ASA, Plavix, statin, amlodipine, angiotensin receptor blocker, nitrates, beta-blocker.  Increase Ranexa to 1000 mg twice a day.     2. CKD (chronic kidney disease), stage 3 (moderate):  FU with Nephrologist.   3. Essential hypertension:  Controlled.   4. Hyperlipidemia:  Continue statin.  He has some back stiffness in the AM.  He has chronically elevated CK levels in the past.  Will try him off of Lipitor for 2 weeks.  If there is no change, he should resume Lipitor. If his symptoms improve, consider changing to Crestor 10 mg QD with an eye towards titrating his dose to 20 mg QD.   5. 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   6. Carotid stenosis, bilateral:  I have recommended he have a FU Carotid US.  He is hesitant to schedule it due to cost. I have tried to encourage him to pursue FU with this.  Continue ASA, statin.       Medication Changes: Current medicines are reviewed at length with the patient today.  Concerns regarding medicines are as outlined above.  The following changes have been made:   Discontinued Medications   ALLOPURINOL (ZYLOPRIM) 100 MG TABLET    Take 50 mg by mouth daily.    ALLOPURINOL PO    Take 50 mg  by mouth daily.   ATORVASTATIN (LIPITOR) 80 MG TABLET  Take 40 mg by mouth daily.   Modified Medications   Modified Medication Previous Medication   RANOLAZINE (RANEXA) 1000 MG SR TABLET ranolazine (RANEXA) 500 MG 12 hr tablet      Take 1 tablet (1,000 mg total) by mouth 2 (two) times daily.    Take 500 mg by mouth 2 (two) times daily.   New Prescriptions   No medications on file   Labs/ tests ordered today include:   No orders of the defined types were placed in this encounter.     Disposition:    FU with Dr. [name] 6 mos.     Signed, Versie Starks, MHS 02/07/2015 11:48 AM    [facility] [place], [place] [address], [place]  [address] Phone: [phone]; Fax: [phone]

## 2015-02-07 ENCOUNTER — Other Ambulatory Visit: Payer: Self-pay | Admitting: Cardiovascular Disease

## 2015-02-07 ENCOUNTER — Ambulatory Visit (INDEPENDENT_AMBULATORY_CARE_PROVIDER_SITE_OTHER): Payer: Commercial Managed Care - HMO | Admitting: Physician Assistant

## 2015-02-07 ENCOUNTER — Encounter: Payer: Self-pay | Admitting: Physician Assistant

## 2015-02-07 VITALS — BP 124/62 | HR 60 | Ht 68.0 in | Wt 173.4 lb

## 2015-02-07 DIAGNOSIS — I4430 Unspecified atrioventricular block: Secondary | ICD-10-CM

## 2015-02-07 DIAGNOSIS — Z950 Presence of cardiac pacemaker: Secondary | ICD-10-CM

## 2015-02-07 DIAGNOSIS — N183 Chronic kidney disease, stage 3 (moderate): Secondary | ICD-10-CM | POA: Diagnosis not present

## 2015-02-07 DIAGNOSIS — E785 Hyperlipidemia, unspecified: Secondary | ICD-10-CM

## 2015-02-07 DIAGNOSIS — I10 Essential (primary) hypertension: Secondary | ICD-10-CM

## 2015-02-07 DIAGNOSIS — I25119 Atherosclerotic heart disease of native coronary artery with unspecified angina pectoris: Secondary | ICD-10-CM | POA: Diagnosis not present

## 2015-02-07 DIAGNOSIS — I6523 Occlusion and stenosis of bilateral carotid arteries: Secondary | ICD-10-CM

## 2015-02-07 MED ORDER — RANOLAZINE ER 1000 MG PO TB12: 1000.0000 mg | ORAL_TABLET | Freq: Two times a day (BID) | ORAL | Status: DC

## 2015-02-07 NOTE — Patient Instructions (Signed)
Medication Instructions:  1. INCREASE RANEXA TO 1000 MG TWICE DAILY  2. HOLD LIPITOR FOR 2 WEEKS ; IF BACK STIFFNESS BETTER AFTER 2 WEEKS PLEASE CALL THE OFFICE [phone] TO LET us KNOW; HOWEVER IF NOT BETTER AFTER THE 2 WEEKS OFF LIPITOR THEN RESUME LIPITOR. Labwork: NONE  Testing/Procedures: PLEASE SCHEDULE CAROTID U/S; ORDER IN SYSTEM ALREADY  Follow-Up: Your physician wants you to follow-up in: 6 MONTHS WITH DR. Emelda Fear will receive a reminder letter in the mail two months in advance. If you don't receive a letter, please call our office to schedule the follow-up appointment.   Any Other Special Instructions Will Be Listed Below (If Applicable).

## 2015-02-09 NOTE — Telephone Encounter (Signed)
Liliane Shi, PA-C at 02/06/2015 5:34 PM  nitroGLYCERIN (NITROSTAT) 0.4 MG SL tabletPlace 1 tablet (0.4 mg total) under the tongue every 5 (five) minutes as needed for chest p 1. Coronary artery disease involving native coronary artery of native heart with angina pectoris: Patient is status post CABG in 1988 and redo CABG in 1996. Most recent cardiac catheterization 7/16 performed due to escalating symptoms of angina revealing severe distal graft stenosis in the SVG-PDA which was treated with a DES. He has stable exertional angina. Continue ASA, Plavix, statin, amlodipine, angiotensin receptor blocker, nitrates, beta-blocker. Increase Ranexa to 1000 mg twice a day.

## 2015-02-21 DIAGNOSIS — C44319 Basal cell carcinoma of skin of other parts of face: Secondary | ICD-10-CM | POA: Diagnosis not present

## 2015-02-21 DIAGNOSIS — Z85828 Personal history of other malignant neoplasm of skin: Secondary | ICD-10-CM | POA: Diagnosis not present

## 2015-02-22 ENCOUNTER — Telehealth: Payer: Self-pay | Admitting: Physician Assistant

## 2015-02-22 DIAGNOSIS — Z9861 Coronary angioplasty status: Secondary | ICD-10-CM

## 2015-02-22 DIAGNOSIS — I2510 Atherosclerotic heart disease of native coronary artery without angina pectoris: Secondary | ICD-10-CM

## 2015-02-22 DIAGNOSIS — E785 Hyperlipidemia, unspecified: Secondary | ICD-10-CM

## 2015-02-22 NOTE — Telephone Encounter (Signed)
Follow up:    Pt would like a call back today please, sorry he missed the first call.

## 2015-02-22 NOTE — Telephone Encounter (Signed)
Tried to return p's call;  though no answer.

## 2015-02-22 NOTE — Telephone Encounter (Signed)
New Message   Pt needs you to contact him about his lipitor   i tried to ask him more questions he just states he wants Scott to call him.

## 2015-02-23 MED ORDER — ROSUVASTATIN CALCIUM 10 MG PO TABS: 10.0000 mg | ORAL_TABLET | Freq: Every day | ORAL | Status: DC

## 2015-02-23 NOTE — Telephone Encounter (Signed)
I s/w pt today who states his back pain resovled being off the lipitor. Pt states he would like to start Crestor as stated by [place] 10/12 ov . Rx Crestor 10 mg sent in today [facility], [facility] 05/04/15.

## 2015-03-08 DIAGNOSIS — N183 Chronic kidney disease, stage 3 (moderate): Secondary | ICD-10-CM | POA: Diagnosis not present

## 2015-03-08 DIAGNOSIS — E785 Hyperlipidemia, unspecified: Secondary | ICD-10-CM | POA: Diagnosis not present

## 2015-03-08 DIAGNOSIS — N189 Chronic kidney disease, unspecified: Secondary | ICD-10-CM | POA: Diagnosis not present

## 2015-03-08 DIAGNOSIS — M109 Gout, unspecified: Secondary | ICD-10-CM | POA: Diagnosis not present

## 2015-03-08 DIAGNOSIS — M329 Systemic lupus erythematosus, unspecified: Secondary | ICD-10-CM | POA: Diagnosis not present

## 2015-03-15 DIAGNOSIS — N183 Chronic kidney disease, stage 3 (moderate): Secondary | ICD-10-CM | POA: Diagnosis not present

## 2015-03-15 DIAGNOSIS — M329 Systemic lupus erythematosus, unspecified: Secondary | ICD-10-CM | POA: Diagnosis not present

## 2015-03-15 DIAGNOSIS — I10 Essential (primary) hypertension: Secondary | ICD-10-CM | POA: Diagnosis not present

## 2015-03-15 DIAGNOSIS — R809 Proteinuria, unspecified: Secondary | ICD-10-CM | POA: Diagnosis not present

## 2015-03-19 ENCOUNTER — Telehealth: Payer: Self-pay | Admitting: Cardiovascular Disease

## 2015-03-19 NOTE — Telephone Encounter (Signed)
I spoke with the pt and he had labs drawn at Dr Etheleen Nicks office (we have not received these results).  The pt said his cholesterol numbers had doubled or tripled since he was off lipitor.  The pt had spoken with our office and was advised to start Crestor on 02/23/15.  The pt did not start this medication until November 17th.  The pt already has a lab appointment pending in January.  I advised him to remain on Crestor and have follow-up labs as scheduled on 05/04/2015.  Pt agreed with plan.

## 2015-03-19 NOTE — Telephone Encounter (Signed)
New message     Did you get lab results from Dr Mercy Moore?  Pt was off cholesterol medication from 10-12 thur 11-17.  Please call

## 2015-04-04 DIAGNOSIS — Y9323 Activity, snow (alpine) (downhill) skiing, snow boarding, sledding, tobogganing and snow tubing: Secondary | ICD-10-CM | POA: Diagnosis not present

## 2015-04-04 DIAGNOSIS — Z950 Presence of cardiac pacemaker: Secondary | ICD-10-CM | POA: Diagnosis not present

## 2015-04-04 DIAGNOSIS — S82831A Other fracture of upper and lower end of right fibula, initial encounter for closed fracture: Secondary | ICD-10-CM | POA: Diagnosis not present

## 2015-04-04 DIAGNOSIS — Z951 Presence of aortocoronary bypass graft: Secondary | ICD-10-CM | POA: Diagnosis not present

## 2015-04-04 DIAGNOSIS — W51XXXA Accidental striking against or bumped into by another person, initial encounter: Secondary | ICD-10-CM | POA: Diagnosis not present

## 2015-04-04 DIAGNOSIS — Z7982 Long term (current) use of aspirin: Secondary | ICD-10-CM | POA: Diagnosis not present

## 2015-04-04 DIAGNOSIS — Y9289 Other specified places as the place of occurrence of the external cause: Secondary | ICD-10-CM | POA: Diagnosis not present

## 2015-04-04 DIAGNOSIS — Z79899 Other long term (current) drug therapy: Secondary | ICD-10-CM | POA: Diagnosis not present

## 2015-04-09 DIAGNOSIS — S82831A Other fracture of upper and lower end of right fibula, initial encounter for closed fracture: Secondary | ICD-10-CM | POA: Diagnosis not present

## 2015-04-20 ENCOUNTER — Other Ambulatory Visit: Payer: Self-pay | Admitting: Cardiovascular Disease

## 2015-04-20 ENCOUNTER — Telehealth: Payer: Self-pay | Admitting: Cardiovascular Disease

## 2015-04-20 ENCOUNTER — Other Ambulatory Visit: Payer: Self-pay

## 2015-04-20 MED ORDER — CLOPIDOGREL BISULFATE 75 MG PO TABS: 75.0000 mg | ORAL_TABLET | Freq: Every day | ORAL | Status: DC

## 2015-04-20 NOTE — Telephone Encounter (Signed)
New message       *STAT* If patient is at the pharmacy, call can be transferred to refill team.   1. Which medications need to be refilled? (please list name of each medication and dose if known) plavix 75mg  2. Which pharmacy/location (including street and city if local pharmacy) is medication to be sent to? [facility]  3. Do they need a 30 day or 90 day supply? Need enough to last until his mail order presc arrive

## 2015-04-25 DIAGNOSIS — S82831D Other fracture of upper and lower end of right fibula, subsequent encounter for closed fracture with routine healing: Secondary | ICD-10-CM | POA: Diagnosis not present

## 2015-04-25 DIAGNOSIS — M20012 Mallet finger of left finger(s): Secondary | ICD-10-CM | POA: Diagnosis not present

## 2015-05-04 ENCOUNTER — Other Ambulatory Visit (INDEPENDENT_AMBULATORY_CARE_PROVIDER_SITE_OTHER): Payer: Commercial Managed Care - HMO | Admitting: *Deleted

## 2015-05-04 DIAGNOSIS — I2510 Atherosclerotic heart disease of native coronary artery without angina pectoris: Secondary | ICD-10-CM

## 2015-05-04 DIAGNOSIS — Z9861 Coronary angioplasty status: Secondary | ICD-10-CM

## 2015-05-04 DIAGNOSIS — E785 Hyperlipidemia, unspecified: Secondary | ICD-10-CM

## 2015-05-04 LAB — LIPID PANEL
Cholesterol: 159 mg/dL (ref 125–200)
HDL: 51 mg/dL (ref >=40)
LDL Cholesterol: 84 mg/dL (ref <130)
Total CHOL/HDL Ratio: 3.1 Ratio (ref <=5.0)
Triglycerides: 118 mg/dL (ref <150)
VLDL: 24 mg/dL (ref <30)

## 2015-05-04 LAB — HEPATIC FUNCTION PANEL
ALT: 23 U/L (ref 9–46)
AST: 24 U/L (ref 10–35)
Albumin: 3.7 g/dL (ref 3.6–5.1)
Alkaline Phosphatase: 69 U/L (ref 40–115)
Bilirubin, Direct: 0.1 mg/dL (ref <=0.2)
Indirect Bilirubin: 0.5 mg/dL (ref 0.2–1.2)
Total Bilirubin: 0.6 mg/dL (ref 0.2–1.2)
Total Protein: 6.4 g/dL (ref 6.1–8.1)

## 2015-05-04 NOTE — Addendum Note (Signed)
Addended by: Eulis Foster on: 05/04/2015 07:52 AM   Modules accepted: Orders

## 2015-05-07 ENCOUNTER — Telehealth: Payer: Self-pay | Admitting: *Deleted

## 2015-05-07 ENCOUNTER — Other Ambulatory Visit: Payer: Self-pay | Admitting: Cardiovascular Disease

## 2015-05-07 DIAGNOSIS — I2510 Atherosclerotic heart disease of native coronary artery without angina pectoris: Secondary | ICD-10-CM

## 2015-05-07 DIAGNOSIS — E785 Hyperlipidemia, unspecified: Secondary | ICD-10-CM

## 2015-05-07 DIAGNOSIS — I25119 Atherosclerotic heart disease of native coronary artery with unspecified angina pectoris: Secondary | ICD-10-CM

## 2015-05-07 DIAGNOSIS — Z9861 Coronary angioplasty status: Secondary | ICD-10-CM

## 2015-05-07 MED ORDER — RANOLAZINE ER 500 MG PO TB12: 1000.0000 mg | ORAL_TABLET | Freq: Two times a day (BID) | ORAL | Status: DC

## 2015-05-07 MED ORDER — ROSUVASTATIN CALCIUM 20 MG PO TABS: 20.0000 mg | ORAL_TABLET | Freq: Every day | ORAL | Status: DC

## 2015-05-07 NOTE — Telephone Encounter (Signed)
Pt notified of lab results and recommendations to increase Crestor to 20 mg daily, LFT/FLP to be done in 3 months. Pt would like to have done with PCP in April. I said this would be fine just have results faxed to Richardson Dopp, PA in our office. Pt is agreeable to plan of care. Pt asked for Crestor and Ranexa to be faxed to [facility].

## 2015-05-11 DIAGNOSIS — S82831D Other fracture of upper and lower end of right fibula, subsequent encounter for closed fracture with routine healing: Secondary | ICD-10-CM | POA: Diagnosis not present

## 2015-05-11 DIAGNOSIS — S82831S Other fracture of upper and lower end of right fibula, sequela: Secondary | ICD-10-CM | POA: Diagnosis not present

## 2015-05-11 DIAGNOSIS — M25571 Pain in right ankle and joints of right foot: Secondary | ICD-10-CM | POA: Diagnosis not present

## 2015-05-17 ENCOUNTER — Other Ambulatory Visit: Payer: Self-pay | Admitting: Cardiovascular Disease

## 2015-05-24 DIAGNOSIS — S82831D Other fracture of upper and lower end of right fibula, subsequent encounter for closed fracture with routine healing: Secondary | ICD-10-CM | POA: Diagnosis not present

## 2015-05-24 DIAGNOSIS — S82831S Other fracture of upper and lower end of right fibula, sequela: Secondary | ICD-10-CM | POA: Diagnosis not present

## 2015-05-24 DIAGNOSIS — M25571 Pain in right ankle and joints of right foot: Secondary | ICD-10-CM | POA: Diagnosis not present

## 2015-05-25 ENCOUNTER — Telehealth: Payer: Self-pay | Admitting: Cardiovascular Disease

## 2015-05-25 DIAGNOSIS — E785 Hyperlipidemia, unspecified: Secondary | ICD-10-CM

## 2015-05-25 DIAGNOSIS — Z9861 Coronary angioplasty status: Secondary | ICD-10-CM

## 2015-05-25 DIAGNOSIS — I2510 Atherosclerotic heart disease of native coronary artery without angina pectoris: Secondary | ICD-10-CM

## 2015-05-25 NOTE — Telephone Encounter (Signed)
New Message  Pt c/o medication issue:  1. Name of Medication: Rosuvastatin (CRESTOR) 20 MG tablet    4. What is your medication issue? Pt states this medication was increased! Pt states that it is making him move around fairly slow so he moved it back to 10 mg.

## 2015-05-25 NOTE — Telephone Encounter (Signed)
Crestor was increased to 20mg  daily on 05/07/15.  I will forward this message to Dr Burt Knack to review and make further recommendations if needed.  Due to symptoms the pt has already decreased his Crestor back to 10mg .

## 2015-05-26 NOTE — Telephone Encounter (Signed)
This is fine - thx for letting me know

## 2015-05-28 NOTE — Telephone Encounter (Signed)
I spoke with the pt and made him aware that Dr Burt Knack is okay with him resuming Crestor 10mg  daily.  The pt has spoken with other people and decided that he will also try taking CoQ10 100-200mg  daily to help with side effects.  The pt plans on having follow-up labs in April with his PCP.

## 2015-06-11 DIAGNOSIS — S82831A Other fracture of upper and lower end of right fibula, initial encounter for closed fracture: Secondary | ICD-10-CM | POA: Diagnosis not present

## 2015-06-18 ENCOUNTER — Encounter: Payer: Self-pay | Admitting: Internal Medicine

## 2015-06-18 ENCOUNTER — Ambulatory Visit (INDEPENDENT_AMBULATORY_CARE_PROVIDER_SITE_OTHER): Payer: Commercial Managed Care - HMO | Admitting: Internal Medicine

## 2015-06-18 VITALS — BP 130/72 | HR 60 | Ht 68.0 in | Wt 174.0 lb

## 2015-06-18 DIAGNOSIS — Z950 Presence of cardiac pacemaker: Secondary | ICD-10-CM | POA: Diagnosis not present

## 2015-06-18 DIAGNOSIS — I255 Ischemic cardiomyopathy: Secondary | ICD-10-CM | POA: Diagnosis not present

## 2015-06-18 DIAGNOSIS — I442 Atrioventricular block, complete: Secondary | ICD-10-CM

## 2015-06-18 LAB — CUP PACEART INCLINIC DEVICE CHECK
Brady Statistic AP VP Percent: 36 %
Brady Statistic AP VS Percent: 0 %
Brady Statistic AS VP Percent: 64 %
Brady Statistic AS VS Percent: 0 %
Implantable Lead Implant Date: 20070831
Implantable Lead Location: 753860
Implantable Lead Model: 4470
Lead Channel Impedance Value: 469 Ohm
Lead Channel Pacing Threshold Amplitude: 0.5 V
Lead Channel Pacing Threshold Amplitude: 0.625 V
Lead Channel Pacing Threshold Amplitude: 0.75 V
Lead Channel Pacing Threshold Pulse Width: 0.4 ms
Lead Channel Pacing Threshold Pulse Width: 0.4 ms
Lead Channel Setting Pacing Amplitude: 2 V
Lead Channel Setting Pacing Amplitude: 2.5 V
Lead Channel Setting Pacing Pulse Width: 0.4 ms
Lead Channel Setting Sensing Sensitivity: 2.8 mV
MDC IDC LEAD IMPLANT DT: 20070831
MDC IDC LEAD LOCATION: 753859
MDC IDC LEAD SERIAL: 485949
MDC IDC LEAD SERIAL: 553665
MDC IDC MSMT BATTERY IMPEDANCE: 100 Ohm
MDC IDC MSMT BATTERY REMAINING LONGEVITY: 134 mo
MDC IDC MSMT BATTERY VOLTAGE: 2.79 V
MDC IDC MSMT LEADCHNL RA IMPEDANCE VALUE: 490 Ohm
MDC IDC MSMT LEADCHNL RA PACING THRESHOLD AMPLITUDE: 0.625 V
MDC IDC MSMT LEADCHNL RA PACING THRESHOLD PULSEWIDTH: 0.4 ms
MDC IDC MSMT LEADCHNL RA SENSING INTR AMPL: 2 mV
MDC IDC MSMT LEADCHNL RV PACING THRESHOLD PULSEWIDTH: 0.4 ms
MDC IDC SESS DTM: 20170220132654

## 2015-06-18 NOTE — Progress Notes (Signed)
Patient Care Team: [name], MD as PCP - General (Internal Medicine) Fleet Contras, MD as Consulting Physician (Nephrology) [name], MD (Cardiology) Deboraha Sprang, MD (Cardiology)   HPI  Kyle Avila is a 70 y.o. 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  His device reached ERI and he underwent generator replacement 5/16   He has a complex cardiovascular history with bypass surgery and redo bypass in 96 and revascularization with stenting of the circumflex in 2009. At that time he had total LAD and RCA disease; his left ventricular function then was okay.   Myoview 11/15 >>Intermediate risk stress nuclear study with a fixed medium-sized, severe basal to mid inferolateral and basal to mid anterolateral perfusion defect. This suggests prior infarction without significant ischemia. Study rated intermediate risk due to low EF at 42%..  Myoview 2013 had demonstrated an EF of 48% Catheterization 2009 EF was 45-50%   He was recently started on ranolazine. When he went skiing this year he had no need for nitroglycerin  Exercise tolerance is stable   Past Medical History  Diagnosis Date  . Mitral insufficiency   . CAD (coronary artery disease)     s/p CABG twice, PCI twice  . Heart block     s/p PTVDP  . Gout   . HTN (hypertension)   . Hyperlipidemia   . Chronic renal insufficiency     stage 3  . 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 Carotid artery occlusion     s/p CEA  . Hx of cardiovascular stress test     Adenosine Myoview (11/15):  Inferolateral and anterolateral infarct without significant ischemia, EF 42% - Intermediate Risk  . Exertional angina ([place]) 11/03/2014  . Myocardial infarction ([place])   . Presence of permanent cardiac pacemaker     Past Surgical History  Procedure Laterality Date  . Coronary artery bypass graft      with redo cabg  . Cardiac catheterization    . Pacemaker  insertion  2007  . Carotid endarterectomy  2002  . Ep implantable device N/A 09/13/2014    Procedure: PPM Generator Changeout;  Surgeon: Deboraha Sprang, MD;  Location: [place] [facility];  Service: Cardiovascular;  Laterality: N/A;  . Cardiac catheterization N/A 11/03/2014    Procedure: Left Heart Cath and Cors/Grafts Angiography;  Surgeon: [name], MD;  Location: [place] [facility];  Service: Cardiovascular;  Laterality: N/A;  . Cardiac catheterization N/A 11/03/2014    Procedure: Coronary Stent Intervention;  Surgeon: [name], MD;  Location: [place] [facility];  Service: Cardiovascular;  Laterality: N/A;  . Coronary stent placement  11/03/2014    SVG PVA  . Achilles tendon repair      Current Outpatient Prescriptions  Medication Sig Dispense Refill  . allopurinol (ZYLOPRIM) 100 MG tablet Patient takes 50 mg tablet by mouth once daily    . amLODipine (NORVASC) 5 MG tablet Take 1 tablet (5 mg total) by mouth daily. 90 tablet 3  . aspirin EC 81 MG EC tablet Take 81 mg by mouth daily.  1 tablet 0  . clopidogrel (PLAVIX) 75 MG tablet Take 1 tablet (75 mg total) by mouth daily. 30 tablet 0  . isosorbide mononitrate (IMDUR) 60 MG 24 hr tablet TAKE 1 TABLET EVERY DAY 90 tablet 2  . ketoconazole (NIZORAL) 2 % cream Apply 1 application topically 2 (two) times daily as needed for irritation.     Marland Kitchen loratadine (  CLARITIN) 10 MG tablet Take 10 mg by mouth daily.    Marland Kitchen losartan (COZAAR) 100 MG tablet Take 100 tablet by mouth  twice a day 180 tablet 2  . metoprolol succinate (TOPROL-XL) 50 MG 24 hr tablet Take 50 mg by mouth 2 (two) times daily. Take with or immediately following a meal.    . NITROSTAT 0.4 MG SL tablet PLACE ONE TABLET UNDER THE TONGUE EVERY 5 (FIVE) MINUTES AS NEEDED FOR CHEST PAIN 25 tablet 3  . ranolazine (RANEXA) 500 MG 12 hr tablet Take 2 tablets (1,000 mg total) by mouth 2 (two) times daily. 360 tablet 3  . rosuvastatin (CRESTOR) 10 MG tablet Take 1 tablet (10 mg total) by  mouth daily.    . tamsulosin (FLOMAX) 0.4 MG CAPS Take 0.4 mg by mouth once daily    . traMADol (ULTRAM) 50 MG tablet Take 50 mg by mouth every 6 (six) hours as needed (pain).      No current facility-administered medications for this visit.    No Known Allergies  Review of Systems negative except from HPI and PMH  Physical Exam BP 130/72 mmHg  Pulse 60  Ht 5\' 8"  (1.727 m)  Wt 174 lb (78.926 kg)  BMI 26.46 kg/m2 Well developed and nourished in no acute distress HENT normal Neck supple with JVP-flat Clear Regular rate and rhythm, no murmurs or gallops Abd-soft with active BS No Clubbing cyanosis edema Skin-warm and dry A & Oriented  Grossly normal sensory and motor function Device pocket well healed; without hematoma or erythema.  There is no tethering   ECG demonstrates AV pacing at 60  Assessment and  Plan  Ischemic cardiomyopathy with prior MI  Hypertension -well-controlled  Pacemaker-[employer]tronic [name] device was interrogated.  The information was reviewed. No changes were made in the programming.    HighGrade heart block device dependent    Overall he is doing well. There has been gradual change in LV systolic function over the last 8 years. I thought it would be prudent to reassess LV function next year prior to his one year return and at that time we can decide whether there is further deterioration in LV systolic function that might prompt CRT upgrade  Without symptoms of ischemia

## 2015-06-18 NOTE — Addendum Note (Signed)
Addended byAlvis Lemmings C on: 06/18/2015 11:35 AM   Modules accepted: Orders

## 2015-06-18 NOTE — Patient Instructions (Signed)
Medication Instructions: - Your physician recommends that you continue on your current medications as directed. Please refer to the Current Medication list given to you today.  Labwork: - none  Procedures/Testing: - Your physician has requested that you have an echocardiogram- 1 year (just prior to follow up with Dr. Caryl Comes). Echocardiography is a painless test that uses sound waves to create images of your heart. It provides your doctor with information about the size and shape of your heart and how well your heart's chambers and valves are working. This procedure takes approximately one hour. There are no restrictions for this procedure.  Follow-Up: Remote monitoring is used to monitor your Pacemaker of ICD from home. This monitoring reduces the number of office visits required to check your device to one time per year. It allows [place] to keep an eye on the functioning of your device to ensure it is working properly. You are scheduled for a device check from home on 09/17/15. You may send your transmission at any time that day. If you have a wireless device, the transmission will be sent automatically. After your physician reviews your transmission, you will receive a postcard with your next transmission date.  - Your physician wants you to follow-up in: 1 year with Dr. Caryl Comes. You will receive a reminder letter in the mail two months in advance. If you don't receive a letter, please call our office to schedule the follow-up appointment.  Any Additional Special Instructions Will Be Listed Below (If Applicable).     If you need a refill on your cardiac medications before your next appointment, please call your pharmacy.

## 2015-07-10 DIAGNOSIS — Z6826 Body mass index (BMI) 26.0-26.9, adult: Secondary | ICD-10-CM | POA: Diagnosis not present

## 2015-07-10 DIAGNOSIS — M25571 Pain in right ankle and joints of right foot: Secondary | ICD-10-CM | POA: Diagnosis not present

## 2015-07-11 DIAGNOSIS — M79671 Pain in right foot: Secondary | ICD-10-CM | POA: Diagnosis not present

## 2015-07-11 DIAGNOSIS — M25671 Stiffness of right ankle, not elsewhere classified: Secondary | ICD-10-CM | POA: Diagnosis not present

## 2015-07-11 DIAGNOSIS — M62471 Contracture of muscle, right ankle and foot: Secondary | ICD-10-CM | POA: Diagnosis not present

## 2015-07-11 DIAGNOSIS — M25571 Pain in right ankle and joints of right foot: Secondary | ICD-10-CM | POA: Diagnosis not present

## 2015-07-17 DIAGNOSIS — M25671 Stiffness of right ankle, not elsewhere classified: Secondary | ICD-10-CM | POA: Diagnosis not present

## 2015-07-17 DIAGNOSIS — M62471 Contracture of muscle, right ankle and foot: Secondary | ICD-10-CM | POA: Diagnosis not present

## 2015-07-17 DIAGNOSIS — M79671 Pain in right foot: Secondary | ICD-10-CM | POA: Diagnosis not present

## 2015-07-17 DIAGNOSIS — M25571 Pain in right ankle and joints of right foot: Secondary | ICD-10-CM | POA: Diagnosis not present

## 2015-07-19 DIAGNOSIS — M25571 Pain in right ankle and joints of right foot: Secondary | ICD-10-CM | POA: Diagnosis not present

## 2015-07-19 DIAGNOSIS — M62471 Contracture of muscle, right ankle and foot: Secondary | ICD-10-CM | POA: Diagnosis not present

## 2015-07-19 DIAGNOSIS — M25671 Stiffness of right ankle, not elsewhere classified: Secondary | ICD-10-CM | POA: Diagnosis not present

## 2015-07-19 DIAGNOSIS — M79671 Pain in right foot: Secondary | ICD-10-CM | POA: Diagnosis not present

## 2015-07-24 DIAGNOSIS — M79671 Pain in right foot: Secondary | ICD-10-CM | POA: Diagnosis not present

## 2015-07-24 DIAGNOSIS — M25571 Pain in right ankle and joints of right foot: Secondary | ICD-10-CM | POA: Diagnosis not present

## 2015-07-24 DIAGNOSIS — M62471 Contracture of muscle, right ankle and foot: Secondary | ICD-10-CM | POA: Diagnosis not present

## 2015-07-24 DIAGNOSIS — M25671 Stiffness of right ankle, not elsewhere classified: Secondary | ICD-10-CM | POA: Diagnosis not present

## 2015-07-26 DIAGNOSIS — M79671 Pain in right foot: Secondary | ICD-10-CM | POA: Diagnosis not present

## 2015-07-26 DIAGNOSIS — M62471 Contracture of muscle, right ankle and foot: Secondary | ICD-10-CM | POA: Diagnosis not present

## 2015-07-26 DIAGNOSIS — M25671 Stiffness of right ankle, not elsewhere classified: Secondary | ICD-10-CM | POA: Diagnosis not present

## 2015-07-26 DIAGNOSIS — M25571 Pain in right ankle and joints of right foot: Secondary | ICD-10-CM | POA: Diagnosis not present

## 2015-07-31 DIAGNOSIS — M79671 Pain in right foot: Secondary | ICD-10-CM | POA: Diagnosis not present

## 2015-07-31 DIAGNOSIS — M62471 Contracture of muscle, right ankle and foot: Secondary | ICD-10-CM | POA: Diagnosis not present

## 2015-07-31 DIAGNOSIS — M25571 Pain in right ankle and joints of right foot: Secondary | ICD-10-CM | POA: Diagnosis not present

## 2015-07-31 DIAGNOSIS — M25671 Stiffness of right ankle, not elsewhere classified: Secondary | ICD-10-CM | POA: Diagnosis not present

## 2015-08-02 DIAGNOSIS — M25571 Pain in right ankle and joints of right foot: Secondary | ICD-10-CM | POA: Diagnosis not present

## 2015-08-02 DIAGNOSIS — M25671 Stiffness of right ankle, not elsewhere classified: Secondary | ICD-10-CM | POA: Diagnosis not present

## 2015-08-02 DIAGNOSIS — M79671 Pain in right foot: Secondary | ICD-10-CM | POA: Diagnosis not present

## 2015-08-02 DIAGNOSIS — M62471 Contracture of muscle, right ankle and foot: Secondary | ICD-10-CM | POA: Diagnosis not present

## 2015-08-13 DIAGNOSIS — M25671 Stiffness of right ankle, not elsewhere classified: Secondary | ICD-10-CM | POA: Diagnosis not present

## 2015-08-13 DIAGNOSIS — M62471 Contracture of muscle, right ankle and foot: Secondary | ICD-10-CM | POA: Diagnosis not present

## 2015-08-13 DIAGNOSIS — M25571 Pain in right ankle and joints of right foot: Secondary | ICD-10-CM | POA: Diagnosis not present

## 2015-08-13 DIAGNOSIS — M79671 Pain in right foot: Secondary | ICD-10-CM | POA: Diagnosis not present

## 2015-08-15 ENCOUNTER — Encounter: Payer: Self-pay | Admitting: Cardiovascular Disease

## 2015-08-15 DIAGNOSIS — M109 Gout, unspecified: Secondary | ICD-10-CM | POA: Diagnosis not present

## 2015-08-15 DIAGNOSIS — E039 Hypothyroidism, unspecified: Secondary | ICD-10-CM | POA: Diagnosis not present

## 2015-08-15 DIAGNOSIS — Z125 Encounter for screening for malignant neoplasm of prostate: Secondary | ICD-10-CM | POA: Diagnosis not present

## 2015-08-15 DIAGNOSIS — E784 Other hyperlipidemia: Secondary | ICD-10-CM | POA: Diagnosis not present

## 2015-08-15 DIAGNOSIS — I10 Essential (primary) hypertension: Secondary | ICD-10-CM | POA: Diagnosis not present

## 2015-08-22 DIAGNOSIS — N183 Chronic kidney disease, stage 3 (moderate): Secondary | ICD-10-CM | POA: Diagnosis not present

## 2015-08-22 DIAGNOSIS — I25118 Atherosclerotic heart disease of native coronary artery with other forms of angina pectoris: Secondary | ICD-10-CM | POA: Diagnosis not present

## 2015-08-22 DIAGNOSIS — Z0000 Encounter for general adult medical examination without abnormal findings: Secondary | ICD-10-CM | POA: Diagnosis not present

## 2015-08-22 DIAGNOSIS — E784 Other hyperlipidemia: Secondary | ICD-10-CM | POA: Diagnosis not present

## 2015-08-22 DIAGNOSIS — N400 Enlarged prostate without lower urinary tract symptoms: Secondary | ICD-10-CM | POA: Diagnosis not present

## 2015-08-22 DIAGNOSIS — Z13820 Encounter for screening for osteoporosis: Secondary | ICD-10-CM | POA: Diagnosis not present

## 2015-08-22 DIAGNOSIS — Z950 Presence of cardiac pacemaker: Secondary | ICD-10-CM | POA: Diagnosis not present

## 2015-08-22 DIAGNOSIS — M25571 Pain in right ankle and joints of right foot: Secondary | ICD-10-CM | POA: Diagnosis not present

## 2015-08-22 DIAGNOSIS — M109 Gout, unspecified: Secondary | ICD-10-CM | POA: Diagnosis not present

## 2015-08-22 DIAGNOSIS — I6521 Occlusion and stenosis of right carotid artery: Secondary | ICD-10-CM | POA: Diagnosis not present

## 2015-09-07 ENCOUNTER — Telehealth: Payer: Self-pay | Admitting: Cardiovascular Disease

## 2015-09-07 NOTE — Telephone Encounter (Signed)
New message      Pt is calling to get dosage for Co-Q 10.

## 2015-09-07 NOTE — Telephone Encounter (Signed)
I spoke with the pt and made him aware that we normally recommend 200mg  of CoQ10 per day.  The pt is currently taking 240mg  with some relief in symptoms.  The pt said he is most likely going to increase his dosage to 300mg  (per another source's recommendation) but he wanted to find out what we advise our patients.

## 2015-09-14 ENCOUNTER — Telehealth: Payer: Self-pay | Admitting: Cardiovascular Disease

## 2015-09-14 NOTE — Telephone Encounter (Signed)
I spoke with the pt and he did have lab work with Dr Dagmar Hait in April.  I made the pt aware that we do not have a copy of this lab work on file. The pt will request a copy of results for his upcoming appointment.

## 2015-09-14 NOTE — Telephone Encounter (Signed)
New message     Want to have labs drawn prior to next appt----will this be ok?  If so need order put in computer.  Please call pt and let him know if he is to have labs drawn

## 2015-09-14 NOTE — Telephone Encounter (Signed)
Notes Recorded by Michae Kava, CMA on 05/07/2015 at 10:29 AM Pt notified of lab results and recommendations to increase Crestor to 20 mg daily, LFT/FLP to be done in 3 months. Pt would like to have done with PCP in April. I said this would be fine just have results faxed to Richardson Dopp, PA in our office. Pt is agreeable to plan of care. Pt asked for Crestor and Ranexa to be faxed to [facility].   I left the pt a message to contact our office in regards to lab results. If the pt had labs with PCP then no labs are needed.  If not done since January then we can arrange.

## 2015-09-17 ENCOUNTER — Ambulatory Visit (INDEPENDENT_AMBULATORY_CARE_PROVIDER_SITE_OTHER): Payer: Commercial Managed Care - HMO | Admitting: *Deleted

## 2015-09-17 DIAGNOSIS — I442 Atrioventricular block, complete: Secondary | ICD-10-CM

## 2015-09-17 NOTE — Progress Notes (Signed)
Remote pacemaker transmission.   

## 2015-09-25 ENCOUNTER — Telehealth: Payer: Self-pay | Admitting: Cardiovascular Disease

## 2015-09-25 NOTE — Telephone Encounter (Signed)
Mr. Sandford is clling to find out iof you received his lab work from Dr. Dagmar Hait' office . Please call   Thanks

## 2015-09-25 NOTE — Telephone Encounter (Signed)
Lab results not received at this time.  I contacted Olivia Mackie at Dr Danna Hefty office and she will fax a copy to our office for pending appointment. Pt aware.

## 2015-09-28 ENCOUNTER — Encounter: Payer: Self-pay | Admitting: Cardiovascular Disease

## 2015-09-28 ENCOUNTER — Ambulatory Visit (INDEPENDENT_AMBULATORY_CARE_PROVIDER_SITE_OTHER): Payer: Commercial Managed Care - HMO | Admitting: Cardiovascular Disease

## 2015-09-28 VITALS — BP 150/74 | HR 60 | Ht 68.0 in | Wt 168.1 lb

## 2015-09-28 DIAGNOSIS — E785 Hyperlipidemia, unspecified: Secondary | ICD-10-CM | POA: Diagnosis not present

## 2015-09-28 DIAGNOSIS — Z9861 Coronary angioplasty status: Secondary | ICD-10-CM | POA: Diagnosis not present

## 2015-09-28 DIAGNOSIS — I2510 Atherosclerotic heart disease of native coronary artery without angina pectoris: Secondary | ICD-10-CM

## 2015-09-28 MED ORDER — ISOSORBIDE MONONITRATE ER 60 MG PO TB24: 60.0000 mg | ORAL_TABLET | Freq: Every day | ORAL | Status: DC

## 2015-09-28 MED ORDER — ROSUVASTATIN CALCIUM 10 MG PO TABS: ORAL_TABLET | ORAL | Status: DC

## 2015-09-28 MED ORDER — LOSARTAN POTASSIUM 100 MG PO TABS: ORAL_TABLET | ORAL | Status: DC

## 2015-09-28 MED ORDER — METOPROLOL SUCCINATE ER 50 MG PO TB24: 50.0000 mg | ORAL_TABLET | Freq: Two times a day (BID) | ORAL | Status: DC

## 2015-09-28 NOTE — Patient Instructions (Addendum)
Medication Instructions:  Your physician has recommended you make the following change in your medication:  1. DECREASE Crestor to 10mg  three times per week  Labwork: No new orders.   Testing/Procedures: No new orders.   Follow-Up: Your physician wants you to follow-up in: 1 YEAR with Dr Burt Knack.  You will receive a reminder letter in the mail two months in advance. If you don't receive a letter, please call our office to schedule the follow-up appointment.   Any Other Special Instructions Will Be Listed Below (If Applicable).     If you need a refill on your cardiac medications before your next appointment, please call your pharmacy.

## 2015-09-28 NOTE — Progress Notes (Signed)
Cardiology Office Note Date:  09/28/2015   ID:  Kyle Avila, DOB 08/17/1945, MRN [id number]  PCP:  Tivis Ringer, MD  Cardiologist:  [name], MD    Chief Complaint  Patient presents with  . Coronary Artery Disease    native vessel  . Hyperlipidemia    has had some cp. no sob,lee, or claudicaiton    History of Present Illness: Kyle Avila is a 70 y.o. male who presents for Follow-up evaluation. He has a history of CAD status post CABG in 1988 and redo bypass in 1996, heart block status post pacemaker, HTN, HL, CKD, carotid stenosis status post right CEA. Cardiac catheterization in 2009 demonstrated severe disease in the LCx and he was treated with overlapping drug-eluting stents.   He presented with progressive anginal symptoms and 2016 and underwent cardiac catheterization demonstrating high-grade stenosis in the distal body of the vein graft to PDA. This was treated with a drug-eluting stent.  The patient complains of back pain. He states this is caused by his cholesterol medicine. It resolved when he stopped atorvastatin, but has come back since he's been on crestor. States the dose of crestor hasn't made much of a difference. He would like to discuss other options. His cholesterol is very high off of medicine.   Reports no major limitation with angina at present. He hasn't required NTG. Today, he denies symptoms of palpitations, chest pain, shortness of breath, orthopnea, PND, lower extremity edema, dizziness, or syncope.  Past Medical History  Diagnosis Date  . Mitral insufficiency   . CAD (coronary artery disease)     s/p CABG twice, PCI twice  . Heart block     s/p PTVDP  . Gout   . HTN (hypertension)   . Hyperlipidemia   . Chronic renal insufficiency     stage 3  . 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 Carotid artery occlusion     s/p CEA  . Hx of cardiovascular stress test     Adenosine Myoview (11/15):  Inferolateral and  anterolateral infarct without significant ischemia, EF 42% - Intermediate Risk  . Exertional angina ([place]) 11/03/2014  . Myocardial infarction ([place])   . Presence of permanent cardiac pacemaker     Past Surgical History  Procedure Laterality Date  . Coronary artery bypass graft      with redo cabg  . Cardiac catheterization    . Pacemaker insertion  2007  . Carotid endarterectomy  2002  . Ep implantable device N/A 09/13/2014    Procedure: PPM Generator Changeout;  Surgeon: Deboraha Sprang, MD;  Location: [facility];  Service: Cardiovascular;  Laterality: N/A;  . Cardiac catheterization N/A 11/03/2014    Procedure: Left Heart Cath and Cors/Grafts Angiography;  Surgeon: [name], MD;  Location: [facility];  Service: Cardiovascular;  Laterality: N/A;  . Cardiac catheterization N/A 11/03/2014    Procedure: Coronary Stent Intervention;  Surgeon: [name], MD;  Location: [facility];  Service: Cardiovascular;  Laterality: N/A;  . Coronary stent placement  11/03/2014    SVG PVA  . Achilles tendon repair      Current Outpatient Prescriptions  Medication Sig Dispense Refill  . isosorbide mononitrate (IMDUR) 60 MG 24 hr tablet Take 1 tablet (60 mg total) by mouth daily. 90 tablet 3  . allopurinol (ZYLOPRIM) 100 MG tablet Patient takes 50 mg tablet by mouth once daily    . amLODipine (NORVASC) 5 MG tablet Take 1 tablet (5 mg  total) by mouth daily. 90 tablet 3  . aspirin EC 81 MG EC tablet Take 81 mg by mouth daily.  1 tablet 0  . clopidogrel (PLAVIX) 75 MG tablet Take 1 tablet (75 mg total) by mouth daily. 30 tablet 0  . ketoconazole (NIZORAL) 2 % cream Apply 1 application topically 2 (two) times daily as needed for irritation.     Marland Kitchen loratadine (CLARITIN) 10 MG tablet Take 10 mg by mouth daily.    Marland Kitchen losartan (COZAAR) 100 MG tablet Take 100 tablet by mouth  twice a day 180 tablet 3  . metoprolol succinate (TOPROL-XL) 50 MG 24 hr tablet Take 1 tablet (50 mg total) by  mouth 2 (two) times daily. Take with or immediately following a meal. 180 tablet 3  . NITROSTAT 0.4 MG SL tablet PLACE ONE TABLET UNDER THE TONGUE EVERY 5 (FIVE) MINUTES AS NEEDED FOR CHEST PAIN 25 tablet 3  . ranolazine (RANEXA) 500 MG 12 hr tablet Take 2 tablets (1,000 mg total) by mouth 2 (two) times daily. 360 tablet 3  . rosuvastatin (CRESTOR) 10 MG tablet Take one tablet by mouth three times per week 45 tablet 3  . tamsulosin (FLOMAX) 0.4 MG CAPS Take 0.4 mg by mouth once daily    . traMADol (ULTRAM) 50 MG tablet Take 50 mg by mouth every 6 (six) hours as needed (pain).      No current facility-administered medications for this visit.   Allergies:   Review of patient's allergies indicates no known allergies.   Social History:  The patient  reports that he has never smoked. He has never used smokeless tobacco. He reports that he drinks alcohol. He reports that he does not use illicit drugs.   Family History:  [name]  family history includes Cancer in his mother; Heart attack in his father; Heart disease in his father.   ROS:  Please see the history of present illness.  Otherwise, review of systems is positive for cough, back pain, muscle pain, easy bruising.  All other systems are reviewed and negative.   PHYSICAL EXAM: VS:  BP 150/74 mmHg  Pulse 60  Ht 5\' 8"  (1.727 m)  Wt 168 lb 1.9 oz (76.259 kg)  BMI 25.57 kg/m2 , BMI Body mass index is 25.57 kg/(m^2). GEN: Well nourished, well developed, in no acute distress HEENT: normal Neck: no JVD, no masses. No carotid bruits Cardiac: RRR without murmur or gallop                Respiratory:  clear to auscultation bilaterally, normal work of breathing GI: soft, nontender, nondistended, + BS MS: no deformity or atrophy Ext: no pretibial edema, pedal pulses 2+= bilaterally Skin: warm and dry, no rash Neuro:  Strength and sensation are intact Psych: euthymic mood, full affect  EKG:  EKG is not ordered today.  Recent  Labs: 11/04/2014: Hemoglobin 15.4; Platelets 144* 11/17/2014: BUN 38*; Creatinine, Ser 2.02*; Potassium 4.7; Sodium 139 05/04/2015: ALT 23   Lipid Panel     Component Value Date/Time   CHOL 159 05/04/2015 0759   TRIG 118 05/04/2015 0759   HDL 51 05/04/2015 0759   CHOLHDL 3.1 05/04/2015 0759   VLDL 24 05/04/2015 0759   LDLCALC 84 05/04/2015 0759      Wt Readings from Last 3 Encounters:  09/28/15 168 lb 1.9 oz (76.259 kg)  06/18/15 174 lb (78.926 kg)  02/07/15 173 lb 6.4 oz (78.654 kg)     Cardiac Studies Reviewed: Cardiac catheterization 11/03/2014:  Conclusion    1. SVG was injected is large. 2. SVG was not injected . 3. Known to be occluded 4. Prox Graft lesion, 100% stenosed. 5. LIMA was injected is normal in caliber, and is anatomically normal. 6. LM lesion, 100% stenosed. 7. Ost Cx to Mid Cx lesion, 20% stenosed. A drug-eluting stent was placed. The lesion was previously treated with a drug-eluting stent greater than two years ago. 8. Ost RCA lesion, 100% stenosed. 9. Dist Graft lesion, 95% stenosed. There is a 0% residual stenosis post intervention. 10. A drug-eluting stent was placed. 11. 1st Mrg lesion, 100% stenosed. 12. Mid LAD to Dist LAD lesion, 40% stenosed.  Final conclusion:  Severe native three-vessel coronary artery disease with total occlusion of the proximal LAD and total occlusion of the proximal RCA  Continued patency of the stented segment extending from the left mainstem into the left circumflex  Continued patency of the LIMA to LAD graft  Continued patency of the saphenous vein graft to right PDA, with severe stenosis in the distal body of the graft treated successfully with a drug-eluting stent platform using distal embolic protection  Recommend continue dual antiplatelets therapy indefinitely with aspirin and clopidogrel. As long as kidney function is stable, anticipate hospital discharge tomorrow. [name] losartan has been held and will be  resumed if kidney function is stable.   ASSESSMENT AND PLAN: 1.  CAD, native vessel and bypass graft disease: stable CCS Class II angina. The patient is stable. He has not taken nitroglycerin recently. His medical program is reviewed and will be continued. He is on long-term dual antiplatelet therapy with his extensive bypass graft disease and multiple PCI procedures.  2. CKD III: followed by nephrology. On an ARB. Most recent labs reviewed with a creatinine of 1.8 mg/dL  3. Essential HTN: BP up this am but he's very stressed about issues at home. Will continue his current program.  4. Hyperlipidemia: Limited by back pain. Decrease Crestor to 10 mg 3 days per week. Discussed other considerations, i.e.PCSK9 inhibitors but I suspect cost would be prohibitive.  5. 2:1 heart block, s/p pacemaker placement, followed by Dr Caryl Comes. His last note is reviewed.   6. Carotid stenosis, bilateral. Hx of CEA. No bruits on exam. He is not inclined to have ultrasound FU because of cost.   Current medicines are reviewed with the patient today.  The patient does not have concerns regarding medicines.  Labs/ tests ordered today include:  No orders of the defined types were placed in this encounter.   Disposition:   FU one year  Signed, [name], MD  09/28/2015 1:36 PM    [facility] [name], [place], [place]  [address] Phone: [phone]; Fax: [phone]

## 2015-10-05 ENCOUNTER — Encounter: Payer: Self-pay | Admitting: Internal Medicine

## 2015-10-05 LAB — CUP PACEART REMOTE DEVICE CHECK
Battery Remaining Longevity: 133 mo
Brady Statistic AS VS Percent: 0 %
Date Time Interrogation Session: 20170522120346
Implantable Lead Implant Date: 20070831
Implantable Lead Location: 753859
Implantable Lead Location: 753860
Implantable Lead Model: 4470
Lead Channel Impedance Value: 466 Ohm
Lead Channel Pacing Threshold Amplitude: 0.875 V
Lead Channel Pacing Threshold Pulse Width: 0.4 ms
Lead Channel Setting Pacing Pulse Width: 0.4 ms
MDC IDC LEAD IMPLANT DT: 20070831
MDC IDC LEAD SERIAL: 485949
MDC IDC LEAD SERIAL: 553665
MDC IDC MSMT BATTERY IMPEDANCE: 100 Ohm
MDC IDC MSMT BATTERY VOLTAGE: 2.79 V
MDC IDC MSMT LEADCHNL RA IMPEDANCE VALUE: 511 Ohm
MDC IDC MSMT LEADCHNL RA PACING THRESHOLD AMPLITUDE: 0.625 V
MDC IDC MSMT LEADCHNL RA PACING THRESHOLD PULSEWIDTH: 0.4 ms
MDC IDC MSMT LEADCHNL RA SENSING INTR AMPL: 1.4 mV
MDC IDC SET LEADCHNL RA PACING AMPLITUDE: 2 V
MDC IDC SET LEADCHNL RV PACING AMPLITUDE: 2.5 V
MDC IDC SET LEADCHNL RV SENSING SENSITIVITY: 2.8 mV
MDC IDC STAT BRADY AP VP PERCENT: 48 %
MDC IDC STAT BRADY AP VS PERCENT: 0 %
MDC IDC STAT BRADY AS VP PERCENT: 52 %

## 2015-10-11 ENCOUNTER — Encounter: Payer: Self-pay | Admitting: Cardiology

## 2015-10-17 DIAGNOSIS — J302 Other seasonal allergic rhinitis: Secondary | ICD-10-CM | POA: Diagnosis not present

## 2015-10-17 DIAGNOSIS — R05 Cough: Secondary | ICD-10-CM | POA: Diagnosis not present

## 2015-10-17 DIAGNOSIS — Z6825 Body mass index (BMI) 25.0-25.9, adult: Secondary | ICD-10-CM | POA: Diagnosis not present

## 2015-11-07 ENCOUNTER — Telehealth: Payer: Self-pay | Admitting: Cardiovascular Disease

## 2015-11-07 DIAGNOSIS — Z9861 Coronary angioplasty status: Secondary | ICD-10-CM

## 2015-11-07 DIAGNOSIS — E785 Hyperlipidemia, unspecified: Secondary | ICD-10-CM

## 2015-11-07 DIAGNOSIS — I2510 Atherosclerotic heart disease of native coronary artery without angina pectoris: Secondary | ICD-10-CM

## 2015-11-07 NOTE — Telephone Encounter (Signed)
If patient is able to tolerate Crestor, will need to wait 2-3 months and recheck lipid panel on current dose.  If LDL still elevated, consider adding Zetia and then potentially PCSK-9 inhibitor.  If pt is unable to tolerate any dose of Crestor, then may be able to consider PCSK-9 inhibitor.  Pt has [employer].  Unsure of the exact cost but most copays are $100-$400 per month for Praluent or Repatha.

## 2015-11-07 NOTE — Telephone Encounter (Signed)
About a month ago pt was changed to Crestor 10mg  three times weekly and was taking CoQ10 as well. Pt did not take CoQ10 long as he states it was causing him some constipation. Pt states taking Crestor three times a week did not improve his back pain at all so he started taking it daily again. Pt wants to know if Dr. Burt Knack would like for him to pursue PCSK9 inhibitors like he had mentioned at last visit or see if he had other recommendations? Will also forward to our pharmacists to inquire about costs for pt.

## 2015-11-07 NOTE — Telephone Encounter (Signed)
Kyle Avila is calling because he went back on his cholesterol medication( not sure of the name of medicine)  and is wanting to know does it have a side effect of aches and pains in his back . Please call    Thanks

## 2015-11-12 NOTE — Telephone Encounter (Signed)
Agree with Gay Filler. If he's able to take crestor 10 mg daily, would check lipids and LFT's in 3 months to reassess lipids. thanks

## 2015-11-13 NOTE — Telephone Encounter (Signed)
Left message on machine for pt to contact the office.   

## 2015-11-13 NOTE — Telephone Encounter (Signed)
Follow up ° ° ° ° °Returned a call to the nurse °

## 2015-11-14 MED ORDER — ROSUVASTATIN CALCIUM 10 MG PO TABS: 10.0000 mg | ORAL_TABLET | Freq: Every day | ORAL | Status: DC

## 2015-11-14 NOTE — Telephone Encounter (Signed)
I spoke with the pt and made him aware of comments from Dr Burt Knack and Elberta Leatherwood Pharm D.  The pt will continue Crestor 10mg  daily and plan to repeat lipid and liver 02/12/16.

## 2015-11-14 NOTE — Telephone Encounter (Signed)
Left message on machine for pt to contact the office.   

## 2015-11-20 DIAGNOSIS — M7662 Achilles tendinitis, left leg: Secondary | ICD-10-CM | POA: Diagnosis not present

## 2015-11-20 DIAGNOSIS — I10 Essential (primary) hypertension: Secondary | ICD-10-CM | POA: Diagnosis not present

## 2015-11-20 DIAGNOSIS — J302 Other seasonal allergic rhinitis: Secondary | ICD-10-CM | POA: Diagnosis not present

## 2015-11-20 DIAGNOSIS — M109 Gout, unspecified: Secondary | ICD-10-CM | POA: Diagnosis not present

## 2015-12-17 ENCOUNTER — Telehealth: Payer: Self-pay | Admitting: Internal Medicine

## 2015-12-17 ENCOUNTER — Ambulatory Visit (INDEPENDENT_AMBULATORY_CARE_PROVIDER_SITE_OTHER): Payer: Commercial Managed Care - HMO | Admitting: *Deleted

## 2015-12-17 ENCOUNTER — Telehealth: Payer: Self-pay | Admitting: Cardiology

## 2015-12-17 DIAGNOSIS — I442 Atrioventricular block, complete: Secondary | ICD-10-CM

## 2015-12-17 NOTE — Progress Notes (Signed)
Remote pacemaker transmission.   

## 2015-12-17 NOTE — Telephone Encounter (Signed)
New message   Pt verbalized that he has two pacemaker machines and that he wants to know what he should do with the extra one

## 2015-12-17 NOTE — Telephone Encounter (Signed)
LMOVM reminding pt to send remote transmission.   

## 2015-12-17 NOTE — Telephone Encounter (Signed)
LMOVM for pt to return call 

## 2015-12-18 NOTE — Telephone Encounter (Signed)
Spoke w/ pt and informed him to keep mycarelink smart monitor and to throw old monitor away. Pt verbalized understanding.

## 2016-01-02 DIAGNOSIS — M7662 Achilles tendinitis, left leg: Secondary | ICD-10-CM | POA: Diagnosis not present

## 2016-01-11 DIAGNOSIS — M7662 Achilles tendinitis, left leg: Secondary | ICD-10-CM | POA: Diagnosis not present

## 2016-01-14 LAB — CUP PACEART REMOTE DEVICE CHECK
Battery Impedance: 100 Ohm
Brady Statistic AP VP Percent: 49 %
Brady Statistic AP VS Percent: 0 %
Brady Statistic AS VP Percent: 51 %
Date Time Interrogation Session: 20170821174738
Implantable Lead Location: 753860
Implantable Lead Model: 4469
Implantable Lead Model: 4470
Implantable Lead Serial Number: 485949
Lead Channel Impedance Value: 483 Ohm
Lead Channel Pacing Threshold Amplitude: 0.625 V
Lead Channel Pacing Threshold Pulse Width: 0.4 ms
Lead Channel Setting Pacing Amplitude: 2.5 V
MDC IDC LEAD IMPLANT DT: 20070831
MDC IDC LEAD IMPLANT DT: 20070831
MDC IDC LEAD LOCATION: 753859
MDC IDC LEAD SERIAL: 553665
MDC IDC MSMT BATTERY REMAINING LONGEVITY: 132 mo
MDC IDC MSMT BATTERY VOLTAGE: 2.79 V
MDC IDC MSMT LEADCHNL RV IMPEDANCE VALUE: 439 Ohm
MDC IDC MSMT LEADCHNL RV PACING THRESHOLD AMPLITUDE: 0.75 V
MDC IDC MSMT LEADCHNL RV PACING THRESHOLD PULSEWIDTH: 0.4 ms
MDC IDC SET LEADCHNL RA PACING AMPLITUDE: 2 V
MDC IDC SET LEADCHNL RV PACING PULSEWIDTH: 0.4 ms
MDC IDC SET LEADCHNL RV SENSING SENSITIVITY: 2.8 mV
MDC IDC STAT BRADY AS VS PERCENT: 0 %

## 2016-01-14 NOTE — Progress Notes (Signed)
Normal remote reviewed. Blunted histogram.  Next remote 03/17/16.

## 2016-01-15 ENCOUNTER — Encounter: Payer: Self-pay | Admitting: Cardiology

## 2016-01-17 DIAGNOSIS — M7662 Achilles tendinitis, left leg: Secondary | ICD-10-CM | POA: Diagnosis not present

## 2016-01-22 ENCOUNTER — Telehealth: Payer: Self-pay | Admitting: Internal Medicine

## 2016-01-22 NOTE — Telephone Encounter (Signed)
New message    Pt received a letter that they received his pacemaker check. The pt asked that you please call and describe what the pacemaker shows.

## 2016-01-22 NOTE — Telephone Encounter (Signed)
Called pt back and let him know that his remote transmission was reviewed and it was normal, pt voiced understanding.

## 2016-01-24 DIAGNOSIS — H31003 Unspecified chorioretinal scars, bilateral: Secondary | ICD-10-CM | POA: Diagnosis not present

## 2016-01-24 DIAGNOSIS — H52203 Unspecified astigmatism, bilateral: Secondary | ICD-10-CM | POA: Diagnosis not present

## 2016-01-24 DIAGNOSIS — H353112 Nonexudative age-related macular degeneration, right eye, intermediate dry stage: Secondary | ICD-10-CM | POA: Diagnosis not present

## 2016-01-24 DIAGNOSIS — H353122 Nonexudative age-related macular degeneration, left eye, intermediate dry stage: Secondary | ICD-10-CM | POA: Diagnosis not present

## 2016-01-25 DIAGNOSIS — M7662 Achilles tendinitis, left leg: Secondary | ICD-10-CM | POA: Diagnosis not present

## 2016-02-06 ENCOUNTER — Telehealth: Payer: Self-pay | Admitting: *Deleted

## 2016-02-06 NOTE — Telephone Encounter (Signed)
LAUREN PT WANTS 1000 MG RANEXA TABS BID, PLEASE ADVISE

## 2016-02-07 ENCOUNTER — Telehealth: Payer: Self-pay | Admitting: Cardiovascular Disease

## 2016-02-07 DIAGNOSIS — I25119 Atherosclerotic heart disease of native coronary artery with unspecified angina pectoris: Secondary | ICD-10-CM

## 2016-02-07 DIAGNOSIS — M5136 Other intervertebral disc degeneration, lumbar region: Secondary | ICD-10-CM | POA: Diagnosis not present

## 2016-02-07 NOTE — Telephone Encounter (Signed)
New Message  Pt c/o medication issue:  1. Name of Medication: Ranexa  2. How are you currently taking this medication (dosage and times per day)? 500 mg twice daily  3. Are you having a reaction (difficulty breathing--STAT)? No  4. What is your medication issue? Pt voiced about changing ranexa changing pill size/dosage.  Pt voiced wanting to change from 500 mg twice daily to 1000 mg once daily  Pt voiced wanting coopers nurse to give pt a call or another nurse if possible.  Please f/u

## 2016-02-08 MED ORDER — RANOLAZINE ER 1000 MG PO TB12
1000.0000 mg | ORAL_TABLET | Freq: Two times a day (BID) | ORAL | 3 refills | Status: DC
Start: 2016-02-08 — End: 2017-04-30

## 2016-02-08 NOTE — Telephone Encounter (Signed)
The pt is currently taking Ranexa 500mg  two tablets by mouth twice a day.  The pt would like to switch to the 1000mg  tablet to same money.  I will update the pt's Rx.

## 2016-02-12 ENCOUNTER — Other Ambulatory Visit (INDEPENDENT_AMBULATORY_CARE_PROVIDER_SITE_OTHER): Payer: Commercial Managed Care - HMO

## 2016-02-12 DIAGNOSIS — E785 Hyperlipidemia, unspecified: Secondary | ICD-10-CM

## 2016-02-12 DIAGNOSIS — I10 Essential (primary) hypertension: Secondary | ICD-10-CM | POA: Diagnosis not present

## 2016-02-12 LAB — LIPID PANEL
CHOL/HDL RATIO: 3.5 ratio (ref <=5.0)
CHOLESTEROL: 159 mg/dL (ref 125–200)
HDL: 46 mg/dL (ref >=40)
LDL Cholesterol: 87 mg/dL (ref <130)
TRIGLYCERIDES: 130 mg/dL (ref <150)
VLDL: 26 mg/dL (ref <30)

## 2016-02-12 LAB — HEPATIC FUNCTION PANEL
ALBUMIN: 3.6 g/dL (ref 3.6–5.1)
ALK PHOS: 68 U/L (ref 40–115)
ALT: 18 U/L (ref 9–46)
AST: 18 U/L (ref 10–35)
BILIRUBIN TOTAL: 0.6 mg/dL (ref 0.2–1.2)
Bilirubin, Direct: 0.1 mg/dL (ref <=0.2)
Indirect Bilirubin: 0.5 mg/dL (ref 0.2–1.2)
Total Protein: 5.7 g/dL — ABNORMAL LOW (ref 6.1–8.1)

## 2016-02-12 NOTE — Addendum Note (Signed)
Addended by: Velna Ochs on: 02/12/2016 08:03 AM   Modules accepted: Orders

## 2016-02-14 ENCOUNTER — Telehealth: Payer: Self-pay | Admitting: Pediatrics

## 2016-02-14 NOTE — Telephone Encounter (Signed)
While speaking to patient in regards to lab work and recommendations, he mentioned having a conversation about Repatha. He states he would like someone to call him back in regards to starting medication and cost. Sending to [facility], RN

## 2016-02-14 NOTE — Telephone Encounter (Signed)
-----   Message from [name], MD sent at 02/13/2016 11:58 AM EDT ----- Lipids stable. LFT's in range. Continue same Rx.

## 2016-02-20 NOTE — Telephone Encounter (Signed)
Please see additional conversation with the pt on lab results.

## 2016-02-21 DIAGNOSIS — C44519 Basal cell carcinoma of skin of other part of trunk: Secondary | ICD-10-CM | POA: Diagnosis not present

## 2016-02-21 DIAGNOSIS — D1801 Hemangioma of skin and subcutaneous tissue: Secondary | ICD-10-CM | POA: Diagnosis not present

## 2016-02-21 DIAGNOSIS — L814 Other melanin hyperpigmentation: Secondary | ICD-10-CM | POA: Diagnosis not present

## 2016-02-21 DIAGNOSIS — D2262 Melanocytic nevi of left upper limb, including shoulder: Secondary | ICD-10-CM | POA: Diagnosis not present

## 2016-02-21 DIAGNOSIS — Z85828 Personal history of other malignant neoplasm of skin: Secondary | ICD-10-CM | POA: Diagnosis not present

## 2016-02-21 DIAGNOSIS — L218 Other seborrheic dermatitis: Secondary | ICD-10-CM | POA: Diagnosis not present

## 2016-02-21 DIAGNOSIS — D485 Neoplasm of uncertain behavior of skin: Secondary | ICD-10-CM | POA: Diagnosis not present

## 2016-02-21 DIAGNOSIS — D2372 Other benign neoplasm of skin of left lower limb, including hip: Secondary | ICD-10-CM | POA: Diagnosis not present

## 2016-02-21 DIAGNOSIS — D225 Melanocytic nevi of trunk: Secondary | ICD-10-CM | POA: Diagnosis not present

## 2016-02-21 DIAGNOSIS — L821 Other seborrheic keratosis: Secondary | ICD-10-CM | POA: Diagnosis not present

## 2016-02-29 ENCOUNTER — Telehealth: Payer: Self-pay | Admitting: Internal Medicine

## 2016-02-29 NOTE — Telephone Encounter (Signed)
New message     Patient calling need to change pacer date to another -Looking at 11/16  Or  11/21

## 2016-02-29 NOTE — Telephone Encounter (Signed)
LMOVM for pt to return call 

## 2016-02-29 NOTE — Telephone Encounter (Signed)
Remote rescheduled to 03-18-16. Pt aware and agreed to this date.

## 2016-03-04 DIAGNOSIS — Z85828 Personal history of other malignant neoplasm of skin: Secondary | ICD-10-CM | POA: Diagnosis not present

## 2016-03-04 DIAGNOSIS — D485 Neoplasm of uncertain behavior of skin: Secondary | ICD-10-CM | POA: Diagnosis not present

## 2016-03-04 DIAGNOSIS — L988 Other specified disorders of the skin and subcutaneous tissue: Secondary | ICD-10-CM | POA: Diagnosis not present

## 2016-03-18 ENCOUNTER — Ambulatory Visit (INDEPENDENT_AMBULATORY_CARE_PROVIDER_SITE_OTHER): Payer: Commercial Managed Care - HMO | Admitting: *Deleted

## 2016-03-18 ENCOUNTER — Telehealth: Payer: Self-pay | Admitting: Cardiology

## 2016-03-18 DIAGNOSIS — I442 Atrioventricular block, complete: Secondary | ICD-10-CM | POA: Diagnosis not present

## 2016-03-18 NOTE — Telephone Encounter (Signed)
Confirmed remote transmission w/ pt wife.   

## 2016-03-19 NOTE — Progress Notes (Signed)
Remote pacemaker transmission.   

## 2016-03-24 DIAGNOSIS — M109 Gout, unspecified: Secondary | ICD-10-CM | POA: Diagnosis not present

## 2016-03-24 DIAGNOSIS — N183 Chronic kidney disease, stage 3 (moderate): Secondary | ICD-10-CM | POA: Diagnosis not present

## 2016-03-24 DIAGNOSIS — E785 Hyperlipidemia, unspecified: Secondary | ICD-10-CM | POA: Diagnosis not present

## 2016-03-24 DIAGNOSIS — R809 Proteinuria, unspecified: Secondary | ICD-10-CM | POA: Diagnosis not present

## 2016-03-24 DIAGNOSIS — D696 Thrombocytopenia, unspecified: Secondary | ICD-10-CM | POA: Diagnosis not present

## 2016-03-24 DIAGNOSIS — I10 Essential (primary) hypertension: Secondary | ICD-10-CM | POA: Diagnosis not present

## 2016-03-24 DIAGNOSIS — M329 Systemic lupus erythematosus, unspecified: Secondary | ICD-10-CM | POA: Diagnosis not present

## 2016-03-25 ENCOUNTER — Telehealth: Payer: Self-pay | Admitting: Cardiovascular Disease

## 2016-03-25 ENCOUNTER — Other Ambulatory Visit: Payer: Self-pay | Admitting: *Deleted

## 2016-03-25 MED ORDER — NITROGLYCERIN 0.4 MG SL SUBL: SUBLINGUAL_TABLET | SUBLINGUAL | 3 refills | Status: DC

## 2016-03-25 NOTE — Telephone Encounter (Signed)
°*  STAT* If patient is at the pharmacy, call can be transferred to refill team.   1. Which medications need to be refilled? (please list name of each medication and dose if known) Nitroglycerin  2. Which pharmacy/location (including street and city if local pharmacy) is medication to be sent to?[facility]-He did not know the phone number-pt wants you to call him once you call it in please    3. Do they need a 30 day or 90 day supply? 25 pills before Thursday

## 2016-03-27 ENCOUNTER — Encounter: Payer: Self-pay | Admitting: Cardiology

## 2016-03-27 DIAGNOSIS — Z6826 Body mass index (BMI) 26.0-26.9, adult: Secondary | ICD-10-CM | POA: Diagnosis not present

## 2016-03-27 DIAGNOSIS — I10 Essential (primary) hypertension: Secondary | ICD-10-CM | POA: Diagnosis not present

## 2016-03-27 DIAGNOSIS — J302 Other seasonal allergic rhinitis: Secondary | ICD-10-CM | POA: Diagnosis not present

## 2016-03-27 DIAGNOSIS — N183 Chronic kidney disease, stage 3 (moderate): Secondary | ICD-10-CM | POA: Diagnosis not present

## 2016-04-02 LAB — CUP PACEART REMOTE DEVICE CHECK
Brady Statistic AP VP Percent: 49 %
Brady Statistic AS VP Percent: 50 %
Brady Statistic AS VS Percent: 0 %
Implantable Lead Implant Date: 20070831
Implantable Lead Location: 753860
Implantable Lead Model: 4470
Implantable Lead Serial Number: 485949
Implantable Lead Serial Number: 553665
Lead Channel Impedance Value: 448 Ohm
Lead Channel Pacing Threshold Amplitude: 0.625 V
Lead Channel Pacing Threshold Amplitude: 0.75 V
Lead Channel Pacing Threshold Pulse Width: 0.4 ms
Lead Channel Sensing Intrinsic Amplitude: 1.4 mV
Lead Channel Setting Pacing Amplitude: 2 V
Lead Channel Setting Pacing Pulse Width: 0.4 ms
Lead Channel Setting Sensing Sensitivity: 2.8 mV
MDC IDC LEAD IMPLANT DT: 20070831
MDC IDC LEAD LOCATION: 753859
MDC IDC MSMT BATTERY IMPEDANCE: 111 Ohm
MDC IDC MSMT BATTERY REMAINING LONGEVITY: 129 mo
MDC IDC MSMT BATTERY VOLTAGE: 2.79 V
MDC IDC MSMT LEADCHNL RA IMPEDANCE VALUE: 490 Ohm
MDC IDC MSMT LEADCHNL RA PACING THRESHOLD PULSEWIDTH: 0.4 ms
MDC IDC PG IMPLANT DT: 20160518
MDC IDC SESS DTM: 20171122123543
MDC IDC SET LEADCHNL RV PACING AMPLITUDE: 2.5 V
MDC IDC STAT BRADY AP VS PERCENT: 0 %

## 2016-04-07 DIAGNOSIS — M7662 Achilles tendinitis, left leg: Secondary | ICD-10-CM | POA: Diagnosis not present

## 2016-04-08 ENCOUNTER — Encounter: Payer: Self-pay | Admitting: Internal Medicine

## 2016-04-08 ENCOUNTER — Telehealth: Payer: Self-pay | Admitting: Cardiovascular Disease

## 2016-04-08 NOTE — Telephone Encounter (Signed)
New message ° °Pt is returning call  ° °Please call back °

## 2016-04-08 NOTE — Telephone Encounter (Signed)
I spoke with the pt and he has a supply of Atorvastatin from 2016 and he would like to finish using up this bottle before he gets his rosuvastatin refilled.  I advised the pt that we do not recommend this since the medication is a year old and the current data we have is a reflection of him taking Rosuvastatin.  The pt also wanted to make [place] aware that he saw Urology last week and due to elevated BP they instructed the pt to increase amlodipine to 10mg  daily.  They are having the pt monitor his BP and call with readings in a few weeks.

## 2016-04-08 NOTE — Telephone Encounter (Signed)
New message      Pt c/o medication issue:  1. Name of Medication: atorvastatin 40mg  and rosuvastatin 10mg  2. How are you currently taking this medication (dosage and times per day)?  3. Are you having a reaction (difficulty breathing--STAT)? no 4. What is your medication issue? Pt was taking atorvastatin then switched to rosuvastatin because of muscle pain.  He is still having muscle pain on the new medication.  Pt want to know if he can take all of the left over atorvastatin and then start the rosuvastatin since he is still having muscle pain.  He did not want to waste his atorvastatin. Please call

## 2016-04-08 NOTE — Telephone Encounter (Signed)
Left message on machine for pt to contact the office.   

## 2016-04-08 NOTE — Telephone Encounter (Signed)
This encounter was created in error - please disregard.

## 2016-04-16 DIAGNOSIS — I10 Essential (primary) hypertension: Secondary | ICD-10-CM | POA: Diagnosis not present

## 2016-04-16 DIAGNOSIS — J329 Chronic sinusitis, unspecified: Secondary | ICD-10-CM | POA: Diagnosis not present

## 2016-05-22 DIAGNOSIS — J324 Chronic pansinusitis: Secondary | ICD-10-CM | POA: Insufficient documentation

## 2016-05-22 DIAGNOSIS — R05 Cough: Secondary | ICD-10-CM | POA: Diagnosis not present

## 2016-05-29 ENCOUNTER — Encounter: Payer: Commercial Managed Care - HMO | Admitting: Internal Medicine

## 2016-06-04 ENCOUNTER — Telehealth: Payer: Self-pay | Admitting: Internal Medicine

## 2016-06-04 NOTE — Telephone Encounter (Signed)
New Message   Pt calling regarding visit on 2/27. Wants to know if he will receive the results from his echo at the visit. Echo is 2 hours prior to doctor appt. Pt stated if he can't get those results then he wants to reschedule, and just call him with new appt. Requesting call back

## 2016-06-04 NOTE — Telephone Encounter (Signed)
Pt wants to know if Dr Caryl Comes would give him the results of his echo.Eho is scheduled to be done on 2/27,  2 hours prior the appointment time with Dr. Caryl Comes. Pt was made aware that the MD will be able to ready the echo and go over the result with pt, specially when is done the same day  prior the appointment. Pt verbalized understanding.

## 2016-06-24 ENCOUNTER — Other Ambulatory Visit: Payer: Self-pay

## 2016-06-24 ENCOUNTER — Encounter: Payer: Self-pay | Admitting: Internal Medicine

## 2016-06-24 ENCOUNTER — Ambulatory Visit (HOSPITAL_COMMUNITY): Payer: Medicare HMO | Attending: Cardiovascular Disease

## 2016-06-24 ENCOUNTER — Ambulatory Visit (INDEPENDENT_AMBULATORY_CARE_PROVIDER_SITE_OTHER): Payer: Medicare HMO | Admitting: Internal Medicine

## 2016-06-24 VITALS — BP 110/60 | HR 52 | Ht 69.0 in | Wt 171.6 lb

## 2016-06-24 DIAGNOSIS — Z950 Presence of cardiac pacemaker: Secondary | ICD-10-CM | POA: Diagnosis not present

## 2016-06-24 DIAGNOSIS — Z951 Presence of aortocoronary bypass graft: Secondary | ICD-10-CM | POA: Insufficient documentation

## 2016-06-24 DIAGNOSIS — Z9861 Coronary angioplasty status: Secondary | ICD-10-CM | POA: Diagnosis not present

## 2016-06-24 DIAGNOSIS — I340 Nonrheumatic mitral (valve) insufficiency: Secondary | ICD-10-CM | POA: Diagnosis not present

## 2016-06-24 DIAGNOSIS — I119 Hypertensive heart disease without heart failure: Secondary | ICD-10-CM | POA: Insufficient documentation

## 2016-06-24 DIAGNOSIS — I2510 Atherosclerotic heart disease of native coronary artery without angina pectoris: Secondary | ICD-10-CM | POA: Insufficient documentation

## 2016-06-24 DIAGNOSIS — I255 Ischemic cardiomyopathy: Secondary | ICD-10-CM | POA: Diagnosis not present

## 2016-06-24 DIAGNOSIS — E785 Hyperlipidemia, unspecified: Secondary | ICD-10-CM | POA: Diagnosis not present

## 2016-06-24 DIAGNOSIS — I442 Atrioventricular block, complete: Secondary | ICD-10-CM | POA: Diagnosis not present

## 2016-06-24 DIAGNOSIS — I252 Old myocardial infarction: Secondary | ICD-10-CM | POA: Insufficient documentation

## 2016-06-24 DIAGNOSIS — I361 Nonrheumatic tricuspid (valve) insufficiency: Secondary | ICD-10-CM | POA: Insufficient documentation

## 2016-06-24 LAB — CUP PACEART INCLINIC DEVICE CHECK
Battery Remaining Longevity: 129 mo
Battery Voltage: 2.78 V
Brady Statistic AP VP Percent: 51 %
Brady Statistic AS VP Percent: 49 %
Implantable Lead Implant Date: 20070831
Implantable Lead Location: 753860
Implantable Lead Serial Number: 553665
Implantable Pulse Generator Implant Date: 20160518
Lead Channel Impedance Value: 457 Ohm
Lead Channel Pacing Threshold Amplitude: 0.75 V
Lead Channel Pacing Threshold Pulse Width: 0.4 ms
Lead Channel Pacing Threshold Pulse Width: 0.4 ms
Lead Channel Sensing Intrinsic Amplitude: 2 mV
Lead Channel Setting Sensing Sensitivity: 2.8 mV
MDC IDC LEAD IMPLANT DT: 20070831
MDC IDC LEAD LOCATION: 753859
MDC IDC LEAD SERIAL: 485949
MDC IDC MSMT BATTERY IMPEDANCE: 111 Ohm
MDC IDC MSMT LEADCHNL RA IMPEDANCE VALUE: 496 Ohm
MDC IDC MSMT LEADCHNL RA PACING THRESHOLD AMPLITUDE: 0.625 V
MDC IDC MSMT LEADCHNL RV PACING THRESHOLD AMPLITUDE: 0.625 V
MDC IDC MSMT LEADCHNL RV PACING THRESHOLD AMPLITUDE: 1 V
MDC IDC MSMT LEADCHNL RV PACING THRESHOLD PULSEWIDTH: 0.4 ms
MDC IDC MSMT LEADCHNL RV PACING THRESHOLD PULSEWIDTH: 0.4 ms
MDC IDC SESS DTM: 20180227140906
MDC IDC SET LEADCHNL RA PACING AMPLITUDE: 2 V
MDC IDC SET LEADCHNL RV PACING AMPLITUDE: 2.5 V
MDC IDC SET LEADCHNL RV PACING PULSEWIDTH: 0.4 ms
MDC IDC STAT BRADY AP VS PERCENT: 0 %
MDC IDC STAT BRADY AS VS PERCENT: 0 %

## 2016-06-24 MED ORDER — ROSUVASTATIN CALCIUM 10 MG PO TABS: 10.0000 mg | ORAL_TABLET | Freq: Every day | ORAL | 3 refills | Status: DC

## 2016-06-24 MED ORDER — CLOPIDOGREL BISULFATE 75 MG PO TABS: 75.0000 mg | ORAL_TABLET | Freq: Every day | ORAL | 3 refills | Status: DC

## 2016-06-24 NOTE — Patient Instructions (Addendum)
Medication Instructions: Your physician recommends that you continue on your current medications as directed. Please refer to the Current Medication list given to you today.   Labwork: None Ordered  Procedures/Testing: None Ordered  Follow-Up: Remote monitoring is used to monitor your Pacemaker from home. This monitoring reduces the number of office visits required to check your device to one time per year. It allows [place] to keep an eye on the functioning of your device to ensure it is working properly. You are scheduled for a device check from home on 09/23/16. You may send your transmission at any time that day. If you have a wireless device, the transmission will be sent automatically. After your physician reviews your transmission, you will receive a postcard with your next transmission date.  Your physician wants you to follow-up in 1 YEAR with Dr. Caryl Comes. You will receive a reminder letter in the mail two months in advance. If you don't receive a letter, please call our office to schedule the follow-up appointment.   Any Additional Special Instructions Will Be Listed Below (If Applicable).     If you need a refill on your cardiac medications before your next appointment, please call your pharmacy.

## 2016-06-24 NOTE — Progress Notes (Signed)
Patient Care Team: [name], MD as PCP - General (Internal Medicine) Fleet Contras, MD as Consulting Physician (Nephrology) [name], MD (Cardiology) Deboraha Sprang, MD (Cardiology)   HPI  Kyle Avila is a 71 y.o. 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  His device reached ERI and he underwent generator replacement 5/16   He has a complex cardiovascular history with bypass surgery and redo bypass in 96 and revascularization with stenting of the circumflex in 2009. At that time he had total LAD and RCA disease; his left ventricular function then was okay.  Catheterization 2009 EF was 45-50% Myoview 2013 had demonstrated an EF of 48%  Myoview 11/15 >>Intermediate risk stress nuclear study with a fixed medium-sized, severe basal to mid inferolateral and basal to mid anterolateral perfusion defect. This suggests prior infarction without significant ischemia. Study rated intermediate risk due to low EF at 42%.. Echo 2/18  EF 45-50%  He is to stay on long-term dual therapy per Dr. Billee Cashing  He is been skiing all over the [place]    He was recently started on ranolazine. When he went skiing this year he had no need for nitroglycerin  Exercise tolerance is stable   Past Medical History:  Diagnosis Date  . CAD (coronary artery disease)    s/p CABG twice, PCI twice  . Carotid artery occlusion    s/p CEA  . Chronic renal insufficiency    stage 3  . Exertional angina ([place]) 11/03/2014  . Gout   . Heart block    s/p PTVDP  . HTN (hypertension)   . Hx of cardiovascular stress test    Adenosine Myoview (11/15):  Inferolateral and anterolateral infarct without significant ischemia, EF 42% - Intermediate Risk  . Hyperlipidemia   . Mitral insufficiency   . Myocardial infarction   . Presence of permanent cardiac pacemaker   . VT (ventricular tachycardia)-nonsustained 05/20/2011    Past Surgical History:  Procedure Laterality Date    . ACHILLES TENDON REPAIR    . CARDIAC CATHETERIZATION    . CARDIAC CATHETERIZATION N/A 11/03/2014   Procedure: Left Heart Cath and Cors/Grafts Angiography;  Surgeon: [name], MD;  Location: [facility];  Service: Cardiovascular;  Laterality: N/A;  . CARDIAC CATHETERIZATION N/A 11/03/2014   Procedure: Coronary Stent Intervention;  Surgeon: [name], MD;  Location: [facility];  Service: Cardiovascular;  Laterality: N/A;  . CAROTID ENDARTERECTOMY  2002  . CORONARY ARTERY BYPASS GRAFT     with redo cabg  . CORONARY STENT PLACEMENT  11/03/2014   SVG PVA  . EP IMPLANTABLE DEVICE N/A 09/13/2014   Procedure: PPM Generator Changeout;  Surgeon: Deboraha Sprang, MD;  Location: [facility];  Service: Cardiovascular;  Laterality: N/A;  . PACEMAKER INSERTION  2007    Current Outpatient Prescriptions  Medication Sig Dispense Refill  . allopurinol (ZYLOPRIM) 100 MG tablet Patient takes 50 mg tablet by mouth once daily    . amLODipine (NORVASC) 10 MG tablet Take 10 mg by mouth daily.    Marland Kitchen aspirin EC 81 MG EC tablet Take 81 mg by mouth daily.  1 tablet 0  . clopidogrel (PLAVIX) 75 MG tablet Take 1 tablet (75 mg total) by mouth daily. 30 tablet 0  . isosorbide mononitrate (IMDUR) 60 MG 24 hr tablet Take 1 tablet (60 mg total) by mouth daily. 90 tablet 3  . ketoconazole (NIZORAL) 2 % cream Apply 1  application topically 2 (two) times daily as needed for irritation.     Marland Kitchen loratadine (CLARITIN) 10 MG tablet Take 10 mg by mouth daily.    Marland Kitchen losartan (COZAAR) 100 MG tablet Take 100 tablet by mouth  twice a day 180 tablet 3  . metoprolol succinate (TOPROL-XL) 50 MG 24 hr tablet Take 1 tablet (50 mg total) by mouth 2 (two) times daily. Take with or immediately following a meal. 180 tablet 3  . nitroGLYCERIN (NITROSTAT) 0.4 MG SL tablet PLACE ONE TABLET UNDER THE TONGUE EVERY 5 (FIVE) MINUTES AS NEEDED FOR CHEST PAIN 25 tablet 3  . ranolazine (RANEXA) 1000 MG SR tablet Take 1 tablet  (1,000 mg total) by mouth 2 (two) times daily. 180 tablet 3  . rosuvastatin (CRESTOR) 10 MG tablet Take 1 tablet (10 mg total) by mouth daily. 90 tablet 3  . tamsulosin (FLOMAX) 0.4 MG CAPS Take 0.4 mg by mouth once daily    . traMADol (ULTRAM) 50 MG tablet Take 50 mg by mouth every 6 (six) hours as needed (pain).      No current facility-administered medications for this visit.     No Known Allergies  Review of Systems negative except from HPI and PMH  Physical Exam BP 110/60   Pulse (!) 52   Ht 5\' 9"  (1.753 m)   Wt 171 lb 9.6 oz (77.8 kg)   SpO2 97%   BMI 25.34 kg/m    Well developed and nourished in no acute distress HENT normal Neck supple with JVP-flat Clear Regular rate and rhythm, no murmurs or gallops Abd-soft with active BS No Clubbing cyanosis edema Skin-warm and dry A & Oriented  Grossly normal sensory and motor function Device pocket well healed; without hematoma or erythema.  There is no tethering   ECG demonstrates AV pacing at 60  occ pVC  Assessment and  Plan  Ischemic cardiomyopathy with prior MI  Hypertension -well-controlled  Pacemaker-Medtronic [name] device was interrogated.  The information was reviewed. No changes were made in the programming.     Complete heart block device dependent  PVCs    This is obtained to look for any evidence of worsening LV function possibly as a consequence of his pacing. Otherwise he is on guideline directed medical therapy.   No interval ischemia. He is tolerating ranolazine well.   He is on rosuvastatin tolerating it well on daily basis

## 2016-07-01 ENCOUNTER — Telehealth: Payer: Self-pay | Admitting: *Deleted

## 2016-07-01 NOTE — Telephone Encounter (Signed)
Pt calling hoping to get a written prescription for Ranexa to take to [place] with him.  Advised Dr. Burt Knack is not back in the office until Monday to sign prescription.  Pt thanked me for call and stated to disregard as he is leaving before Monday for [place].

## 2016-07-01 NOTE — Telephone Encounter (Signed)
Patient called and requested a written rx for ranexa. He would like to pick this up by tomorrow afternoon. Patient requested a call back at [phone]. Thanks, MI

## 2016-07-09 ENCOUNTER — Other Ambulatory Visit: Payer: Self-pay | Admitting: *Deleted

## 2016-07-09 MED ORDER — CLOPIDOGREL BISULFATE 75 MG PO TABS: 75.0000 mg | ORAL_TABLET | Freq: Every day | ORAL | 0 refills | Status: DC

## 2016-07-09 MED ORDER — CLOPIDOGREL BISULFATE 75 MG PO TABS
75.0000 mg | ORAL_TABLET | Freq: Every day | ORAL | 0 refills | Status: DC
Start: 2016-07-09 — End: 2016-12-01

## 2016-07-16 ENCOUNTER — Telehealth: Payer: Self-pay | Admitting: Cardiovascular Disease

## 2016-07-16 NOTE — Telephone Encounter (Signed)
New message   Pt is calling with concerns about ranexa. He is requesting call.

## 2016-07-16 NOTE — Telephone Encounter (Signed)
I spoke to patient.  He states he is trying to reduce his medication cost.  He states he has recently researched Ranexa and realized there are drug interactions with Toprol XL and Imdur.  He states he would like to stop taking one of those two medications if you are in agreement.  He states he will be in the "donut hole" soon if he stays on all of his medications.  He states he will follow your recommendations, just trying to decrease costs.  I advised him since he has Medicare coupons for Ranexa are not available.  I did put two weeks supply of samples up front for pick up.  I advised patient.  He voiced understanding and agreed with the plan.

## 2016-07-31 ENCOUNTER — Other Ambulatory Visit: Payer: Self-pay | Admitting: Cardiovascular Disease

## 2016-07-31 DIAGNOSIS — Z9861 Coronary angioplasty status: Secondary | ICD-10-CM

## 2016-07-31 DIAGNOSIS — E785 Hyperlipidemia, unspecified: Secondary | ICD-10-CM

## 2016-07-31 DIAGNOSIS — I2510 Atherosclerotic heart disease of native coronary artery without angina pectoris: Secondary | ICD-10-CM

## 2016-08-07 NOTE — Telephone Encounter (Signed)
These medicines are commonly used together - no major concern that I know of. If he wants to reduce cost it would be ok to stop Ranexa and see if symptoms change significantly.

## 2016-08-12 NOTE — Telephone Encounter (Signed)
I spoke with the pt and made him aware of Dr Antionette Char comments.  The pt may lower his Ranexa dose or stop to see how he does from a symptom stand point.

## 2016-08-20 DIAGNOSIS — E038 Other specified hypothyroidism: Secondary | ICD-10-CM | POA: Diagnosis not present

## 2016-08-20 DIAGNOSIS — I10 Essential (primary) hypertension: Secondary | ICD-10-CM | POA: Diagnosis not present

## 2016-08-20 DIAGNOSIS — Z125 Encounter for screening for malignant neoplasm of prostate: Secondary | ICD-10-CM | POA: Diagnosis not present

## 2016-08-20 DIAGNOSIS — M109 Gout, unspecified: Secondary | ICD-10-CM | POA: Diagnosis not present

## 2016-08-20 DIAGNOSIS — E784 Other hyperlipidemia: Secondary | ICD-10-CM | POA: Diagnosis not present

## 2016-08-29 DIAGNOSIS — I25118 Atherosclerotic heart disease of native coronary artery with other forms of angina pectoris: Secondary | ICD-10-CM | POA: Diagnosis not present

## 2016-08-29 DIAGNOSIS — N183 Chronic kidney disease, stage 3 (moderate): Secondary | ICD-10-CM | POA: Diagnosis not present

## 2016-08-29 DIAGNOSIS — M109 Gout, unspecified: Secondary | ICD-10-CM | POA: Diagnosis not present

## 2016-08-29 DIAGNOSIS — I739 Peripheral vascular disease, unspecified: Secondary | ICD-10-CM | POA: Diagnosis not present

## 2016-08-29 DIAGNOSIS — I10 Essential (primary) hypertension: Secondary | ICD-10-CM | POA: Diagnosis not present

## 2016-08-29 DIAGNOSIS — I6521 Occlusion and stenosis of right carotid artery: Secondary | ICD-10-CM | POA: Diagnosis not present

## 2016-08-29 DIAGNOSIS — Z0000 Encounter for general adult medical examination without abnormal findings: Secondary | ICD-10-CM | POA: Diagnosis not present

## 2016-08-29 DIAGNOSIS — E784 Other hyperlipidemia: Secondary | ICD-10-CM | POA: Diagnosis not present

## 2016-08-29 DIAGNOSIS — Z950 Presence of cardiac pacemaker: Secondary | ICD-10-CM | POA: Diagnosis not present

## 2016-09-01 DIAGNOSIS — Z1212 Encounter for screening for malignant neoplasm of rectum: Secondary | ICD-10-CM | POA: Diagnosis not present

## 2016-09-12 ENCOUNTER — Encounter: Payer: Self-pay | Admitting: *Deleted

## 2016-09-14 ENCOUNTER — Other Ambulatory Visit: Payer: Self-pay | Admitting: Cardiovascular Disease

## 2016-09-14 DIAGNOSIS — Z9861 Coronary angioplasty status: Secondary | ICD-10-CM

## 2016-09-14 DIAGNOSIS — E785 Hyperlipidemia, unspecified: Secondary | ICD-10-CM

## 2016-09-14 DIAGNOSIS — I2510 Atherosclerotic heart disease of native coronary artery without angina pectoris: Secondary | ICD-10-CM

## 2016-09-23 ENCOUNTER — Telehealth: Payer: Self-pay | Admitting: Internal Medicine

## 2016-09-23 ENCOUNTER — Telehealth: Payer: Self-pay | Admitting: Cardiology

## 2016-09-23 ENCOUNTER — Ambulatory Visit (INDEPENDENT_AMBULATORY_CARE_PROVIDER_SITE_OTHER): Payer: Medicare HMO | Admitting: *Deleted

## 2016-09-23 DIAGNOSIS — I442 Atrioventricular block, complete: Secondary | ICD-10-CM

## 2016-09-23 NOTE — Telephone Encounter (Signed)
New message    Pt is calling for help with his transmission for today.

## 2016-09-23 NOTE — Telephone Encounter (Signed)
Spoke with pt and reminded pt of remote transmission that is due today. Pt verbalized understanding.   

## 2016-09-23 NOTE — Telephone Encounter (Signed)
Transmission received. LMOM with device clinic call back # if needed.

## 2016-09-25 NOTE — Progress Notes (Signed)
Remote pacemaker transmission.   

## 2016-09-26 LAB — CUP PACEART REMOTE DEVICE CHECK
Battery Impedance: 135 Ohm
Battery Remaining Longevity: 122 mo
Brady Statistic AP VP Percent: 70 %
Brady Statistic AS VP Percent: 29 %
Brady Statistic AS VS Percent: 0 %
Implantable Lead Implant Date: 20070831
Implantable Lead Location: 753859
Implantable Lead Model: 4469
Implantable Lead Model: 4470
Implantable Lead Serial Number: 553665
Lead Channel Impedance Value: 466 Ohm
Lead Channel Impedance Value: 512 Ohm
Lead Channel Pacing Threshold Amplitude: 0.5 V
Lead Channel Pacing Threshold Pulse Width: 0.4 ms
Lead Channel Setting Pacing Amplitude: 2 V
Lead Channel Setting Pacing Amplitude: 2.5 V
Lead Channel Setting Pacing Pulse Width: 0.4 ms
MDC IDC LEAD IMPLANT DT: 20070831
MDC IDC LEAD LOCATION: 753860
MDC IDC LEAD SERIAL: 485949
MDC IDC MSMT BATTERY VOLTAGE: 2.79 V
MDC IDC MSMT LEADCHNL RV PACING THRESHOLD AMPLITUDE: 0.875 V
MDC IDC MSMT LEADCHNL RV PACING THRESHOLD PULSEWIDTH: 0.4 ms
MDC IDC PG IMPLANT DT: 20160518
MDC IDC SESS DTM: 20180529143844
MDC IDC SET LEADCHNL RV SENSING SENSITIVITY: 2.8 mV
MDC IDC STAT BRADY AP VS PERCENT: 1 %

## 2016-10-02 ENCOUNTER — Ambulatory Visit (INDEPENDENT_AMBULATORY_CARE_PROVIDER_SITE_OTHER): Payer: Medicare HMO | Admitting: Cardiovascular Disease

## 2016-10-02 ENCOUNTER — Encounter (INDEPENDENT_AMBULATORY_CARE_PROVIDER_SITE_OTHER): Payer: Self-pay

## 2016-10-02 ENCOUNTER — Encounter: Payer: Self-pay | Admitting: Cardiovascular Disease

## 2016-10-02 VITALS — BP 126/72 | HR 62 | Ht 69.0 in | Wt 172.1 lb

## 2016-10-02 DIAGNOSIS — I25119 Atherosclerotic heart disease of native coronary artery with unspecified angina pectoris: Secondary | ICD-10-CM

## 2016-10-02 NOTE — Patient Instructions (Signed)

## 2016-10-02 NOTE — Progress Notes (Signed)
Cardiology Office Note Date:  10/03/2016   ID:  Kyle Avila, DOB November 09, 1945, MRN [id number]  PCP:  [name], MD  Cardiologist:  [name], MD    Chief Complaint  Patient presents with  . Chest Pain   History of Present Illness: Kyle Avila is a 71 y.o. male who presents for follow-up of CAD.   He has a history of CAD status post CABG in 1988 and redo bypass in 1996, heart block status post pacemaker, HTN, HL, CKD, carotid stenosis status post right CEA. Cardiac catheterization in 2009 demonstrated severe disease in the LCx and he was treated with overlapping drug-eluting stents.   He's made a few adjustments to his medicines - now taking Ranexa once daily - cost in donut hole is $1000/quarter. He has not required any NTG recently. Not exercising much lately, but plans to get back on an exercise regimen. No DOE, edema, orthopnea, or PND. No other complaints at present.   Past Medical History:  Diagnosis Date  . CAD (coronary artery disease)    s/p CABG twice, PCI twice  . Carotid artery occlusion    s/p CEA  . Chronic renal insufficiency    stage 3  . Exertional angina ([place]) 11/03/2014  . Gout   . Heart block    s/p PTVDP  . HTN (hypertension)   . Hx of cardiovascular stress test    Adenosine Myoview (11/15):  Inferolateral and anterolateral infarct without significant ischemia, EF 42% - Intermediate Risk  . Hyperlipidemia   . Mitral insufficiency   . Myocardial infarction ([place])   . Presence of permanent cardiac pacemaker   . 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 ACHILLES TENDON REPAIR    . CARDIAC CATHETERIZATION    . CARDIAC CATHETERIZATION N/A 11/03/2014   Procedure: Left Heart Cath and Cors/Grafts Angiography;  Surgeon: [name], MD;  Location: [facility];  Service: Cardiovascular;  Laterality: N/A;  . CARDIAC CATHETERIZATION N/A 11/03/2014   Procedure: Coronary  Stent Intervention;  Surgeon: [name], MD;  Location: [facility];  Service: Cardiovascular;  Laterality: N/A;  . CAROTID ENDARTERECTOMY  2002  . CORONARY ARTERY BYPASS GRAFT     with redo cabg  . CORONARY STENT PLACEMENT  11/03/2014   SVG PVA  . EP IMPLANTABLE DEVICE N/A 09/13/2014   Procedure: PPM Generator Changeout;  Surgeon: Deboraha Sprang, MD;  Location: [facility];  Service: Cardiovascular;  Laterality: N/A;  . PACEMAKER INSERTION  2007    Current Outpatient Prescriptions  Medication Sig Dispense Refill  . allopurinol (ZYLOPRIM) 100 MG tablet Patient takes 50 mg tablet by mouth once daily    . amLODipine (NORVASC) 10 MG tablet Take 10 mg by mouth daily.    Marland Kitchen aspirin EC 81 MG EC tablet Take 81 mg by mouth daily.  1 tablet 0  . clopidogrel (PLAVIX) 75 MG tablet Take 1 tablet (75 mg total) by mouth daily. . 15 tablet 0  . colchicine 0.6 MG tablet Take 0.6 mg by mouth as needed.    . furosemide (LASIX) 40 MG tablet Take 40 mg by mouth daily.     . GUAIFENESIN PO Take by mouth as directed.    . isosorbide mononitrate (IMDUR) 60 MG 24 hr tablet Take 1 tablet (60 mg total) by mouth daily. 90 tablet 3  . ketoconazole (NIZORAL) 2 % cream Apply 1 application topically 2 (two) times  daily as needed for irritation.     Marland Kitchen loratadine (CLARITIN) 10 MG tablet Take 10 mg by mouth daily.    Marland Kitchen losartan (COZAAR) 100 MG tablet Take 1 tablet (100 mg total) by mouth 2 (two) times daily. 180 tablet 3  . meclizine (ANTIVERT) 25 MG tablet Take 25 mg by mouth as directed.    . metoprolol succinate (TOPROL-XL) 50 MG 24 hr tablet TAKE 1 TABLET TWICE DAILY. TAKE WITH OR IMMEDIATELY FOLLOWING MEALS. 180 tablet 0  . nitroGLYCERIN (NITROSTAT) 0.4 MG SL tablet PLACE ONE TABLET UNDER THE TONGUE EVERY 5 (FIVE) MINUTES AS NEEDED FOR CHEST PAIN 25 tablet 3  . ranolazine (RANEXA) 1000 MG SR tablet Take 1 tablet (1,000 mg total) by mouth 2 (two) times daily. 180 tablet 3  . rosuvastatin (CRESTOR) 10  MG tablet Take 1 tablet (10 mg total) by mouth daily. 90 tablet 3  . tamsulosin (FLOMAX) 0.4 MG CAPS Take 0.4 mg by mouth once daily    . traMADol (ULTRAM) 50 MG tablet Take 50 mg by mouth every 6 (six) hours as needed (pain).      No current facility-administered medications for this visit.     Allergies:   Patient has no known allergies.   Social History:  The patient  reports that he has never smoked. He has never used smokeless tobacco. He reports that he drinks alcohol. He reports that he does not use drugs.   Family History:  [name]  family history includes Cancer in his mother; Heart attack in his father; Heart disease in his father.    ROS:  Please see the history of present illness.  Otherwise, review of systems is positive for hearing loss, back pain, muscle pain, fatigue.  All other systems are reviewed and negative.    PHYSICAL EXAM: VS:  BP 126/72   Pulse 62   Ht 5' 9"  (1.753 m)   Wt 172 lb 1.9 oz (78.1 kg)   BMI 25.42 kg/m  , BMI Body mass index is 25.42 kg/m. GEN: Well nourished, well developed, in no acute distress  HEENT: normal  Neck: no JVD, no masses. No carotid bruits Cardiac: RRR without murmur or gallop                Respiratory:  clear to auscultation bilaterally, normal work of breathing GI: soft, nontender, nondistended, + BS MS: no deformity or atrophy  Ext: no pretibial edema, pedal pulses 2+= bilaterally Skin: warm and dry, no rash Neuro:  Strength and sensation are intact Psych: euthymic mood, full affect  EKG:  EKG is not ordered today.  Recent Labs: 02/12/2016: ALT 18   Lipid Panel     Component Value Date/Time   CHOL 159 02/12/2016 0803   TRIG 130 02/12/2016 0803   HDL 46 02/12/2016 0803   CHOLHDL 3.5 02/12/2016 0803   VLDL 26 02/12/2016 0803   LDLCALC 87 02/12/2016 0803      Wt Readings from Last 3 Encounters:  10/02/16 172 lb 1.9 oz (78.1 kg)  06/24/16 171 lb 9.6 oz (77.8 kg)  09/28/15 168 lb 1.9 oz (76.3 kg)      Cardiac Studies Reviewed: 2D Echo 06-24-2016: Study Conclusions  - Left ventricle: The cavity size was normal. There was mild   concentric hypertrophy. Systolic function was mildly reduced. The   estimated ejection fraction was in the range of 45% to 50%.   Hypokinesis of the inferolateral, inferior, and inferoseptal   myocardium. Doppler parameters are consistent  with abnormal left   ventricular relaxation (grade 1 diastolic dysfunction). Doppler   parameters are consistent with high ventricular filling pressure. - Aortic valve: Transvalvular velocity was within the normal range.   There was no stenosis. There was no regurgitation. - Mitral valve: Transvalvular velocity was within the normal range.   There was no evidence for stenosis. There was mild regurgitation. - Left atrium: The atrium was mildly dilated. - Right ventricle: The cavity size was normal. Wall thickness was   normal. Systolic function was mildly reduced. - Atrial septum: No defect or patent foramen ovale was identified   by color flow Doppler. - Tricuspid valve: There was mild regurgitation. - Pulmonary arteries: Systolic pressure was within the normal   range. PA peak pressure: 32 mm Hg (S).  Cardiac Cath 11-03-2014: Final conclusion:  Severe native three-vessel coronary artery disease with total occlusion of the proximal LAD and total occlusion of the proximal RCA  Continued patency of the stented segment extending from the left mainstem into the left circumflex  Continued patency of the LIMA to LAD graft  Continued patency of the saphenous vein graft to right PDA, with severe stenosis in the distal body of the graft treated successfully with a drug-eluting stent platform using distal embolic protection  Recommend continue dual antiplatelets therapy indefinitely with aspirin and clopidogrel. As long as kidney function is stable, anticipate hospital discharge tomorrow. [name] losartan has been held and  will be resumed if kidney function is stable.   Indications   Exertional angina (HCC) [I20.8 ([id number])]  Complications   Complications documented in old activity   INDICATION: CCS class III angina on maximal medical therapy. 71 year old gentleman with extensive CAD, 2 previous bypass surgeries, and progressive exertional angina.   PROCEDURAL DETAILS: The left wrist was prepped, draped, and anesthetized with 1% lidocaine. Using the modified Seldinger technique, a 5/6 French Slender sheath was introduced into the left radial artery. 3 mg of verapamil was administered through the sheath, weight-based unfractionated heparin was administered intravenously. Standard Judkins catheters were used for selective coronary angiography and bypass graft angiography. Ventriculography is deferred because of chronic kidney disease. A JR4 catheter was used to image the LIMA graft, but a LIMA catheter would probably fit better. For the RCA graft, a multipurpose catheter is used. Catheter exchanges were performed over an exchange length guidewire. There were no immediate procedural complications. See below for description of PCI procedure. A TR band was used for radial hemostasis at the completion of the procedure. The patient was transferred to the post catheterization recovery area for further monitoring.There were no immediate complications during the procedure.      Coronary Findings   Dominance: Right  Left Main  LM lesion, 100% stenosed. calcified .  Left Anterior Descending  Mid LAD to Dist LAD lesion, 40% stenosed. There is a 40% lesion in the LAD just beyond the graft insertion site. This does not appear to be flow-limiting.  Ramus Intermedius  Ramus filled by collaterals from 2nd Sept.  Left Circumflex  Ost Cx to Mid Cx lesion, 20% stenosed. diffuse . The lesion was previously treated with a drug-eluting stent greater than two years ago.  First Obtuse Marginal Branch  1st Mrg lesion, 100%  stenosed.  Right Coronary Artery  Ost RCA lesion, 100% stenosed.  Graft Angiography  Free Graft to RPDA  SVG was injected is large.  Dist Graft lesion, 95% stenosed. discrete .  PCI: The pre-interventional distal flow is normal (TIMI 3). No pre-stent  angioplasty was performed. A drug-eluting stent was placed. No post-stent angioplasty was performed. Maximum pressure: 14 atm. The post-interventional distal flow is normal (TIMI 3). The intervention was successful. No complications occurred at this lesion. There is severe stenosis of the SVG to PDA in the distal body of the graft. Heparin is used for anticoagulation. The patient has been on long-term aspirin and Plavix. A therapeutic ACT was achieved. A 6 [place] multipurpose guide catheter was used. A cougar wire is used to cross the lesion without difficulty. A 5 mm spider devices used for distal embolic protection. The lesion is primarily stented with a 4.0 x 15 mm Xience DES to 14 atm. following PCI, there is 0% residual stenosis and TIMI-3 flow.  There is no residual stenosis post intervention.  Free Graft to 1st Mrg  SVG was not injected . Known to be occluded  Prox Graft lesion, 100% stenosed.  Free LIMA Graft to Mid LAD  LIMA was injected is normal in caliber, and is anatomically normal.  Coronary Diagrams   Diagnostic Diagram       Post-Intervention Diagram         ASSESSMENT AND PLAN: 1.  CAD, native vessel, with angina: reviewed his anti-anginal program and will not make any changes today. Overall his symptoms appear to be well-controlled.   2. HTN: BP controlled on amlodipine, isosorbide, losartan, and metoprolol.  3. Hyperlipidemia: LDL above goal, but dose of crestor limited by myalgias. He is tolerating crestor 10 mg daily which is better than he was doing last year. PCSK9 is cost-prohibitive.  4. Carotid stenosis without hx of stroke. Reviewed his last doppler from 2015 when he had patency of his CEA site and <40%  stenosis on the contralateral side. He does not wish for FU studies unless something changes with his exam or symptoms.   5. CKD 3: followed by Dr Mercy Moore.   6. Heart block: s/p PPM followed by Dr Caryl Comes.  Current medicines are reviewed with the patient today.  The patient does not have concerns regarding medicines.  Labs/ tests ordered today include:  No orders of the defined types were placed in this encounter.   Disposition:   FU one  year  Signed, [name], MD  10/03/2016 4:45 PM    [facility] [place], [place], [place]  [address] Phone: [phone]; Fax: [phone]

## 2016-10-03 ENCOUNTER — Ambulatory Visit: Payer: Medicare HMO | Admitting: Cardiovascular Disease

## 2016-10-03 ENCOUNTER — Encounter: Payer: Self-pay | Admitting: Cardiology

## 2016-10-06 DIAGNOSIS — Z961 Presence of intraocular lens: Secondary | ICD-10-CM | POA: Diagnosis not present

## 2016-10-06 DIAGNOSIS — Z23 Encounter for immunization: Secondary | ICD-10-CM | POA: Diagnosis not present

## 2016-10-06 DIAGNOSIS — H5315 Visual distortions of shape and size: Secondary | ICD-10-CM | POA: Diagnosis not present

## 2016-10-06 DIAGNOSIS — H25811 Combined forms of age-related cataract, right eye: Secondary | ICD-10-CM | POA: Diagnosis not present

## 2016-10-06 DIAGNOSIS — H353132 Nonexudative age-related macular degeneration, bilateral, intermediate dry stage: Secondary | ICD-10-CM | POA: Diagnosis not present

## 2016-10-08 ENCOUNTER — Telehealth: Payer: Self-pay | Admitting: Internal Medicine

## 2016-10-08 DIAGNOSIS — I10 Essential (primary) hypertension: Secondary | ICD-10-CM | POA: Diagnosis not present

## 2016-10-08 NOTE — Telephone Encounter (Signed)
Error

## 2016-11-05 ENCOUNTER — Ambulatory Visit: Payer: Medicare HMO | Admitting: Cardiovascular Disease

## 2016-12-01 ENCOUNTER — Other Ambulatory Visit: Payer: Self-pay | Admitting: Cardiovascular Disease

## 2016-12-01 DIAGNOSIS — I2510 Atherosclerotic heart disease of native coronary artery without angina pectoris: Secondary | ICD-10-CM

## 2016-12-01 DIAGNOSIS — Z9861 Coronary angioplasty status: Secondary | ICD-10-CM

## 2016-12-01 DIAGNOSIS — E785 Hyperlipidemia, unspecified: Secondary | ICD-10-CM

## 2016-12-01 MED ORDER — CLOPIDOGREL BISULFATE 75 MG PO TABS: 75.0000 mg | ORAL_TABLET | Freq: Every day | ORAL | 2 refills | Status: DC

## 2016-12-23 ENCOUNTER — Ambulatory Visit (INDEPENDENT_AMBULATORY_CARE_PROVIDER_SITE_OTHER): Payer: Medicare HMO | Admitting: *Deleted

## 2016-12-23 DIAGNOSIS — I442 Atrioventricular block, complete: Secondary | ICD-10-CM

## 2016-12-23 NOTE — Progress Notes (Signed)
Remote pacemaker transmission.   

## 2016-12-24 LAB — CUP PACEART REMOTE DEVICE CHECK
Battery Impedance: 135 Ohm
Battery Remaining Longevity: 122 mo
Brady Statistic AS VS Percent: 0 %
Implantable Lead Implant Date: 20070831
Implantable Lead Implant Date: 20070831
Implantable Lead Location: 753860
Implantable Lead Model: 4470
Implantable Lead Serial Number: 485949
Lead Channel Impedance Value: 445 Ohm
Lead Channel Pacing Threshold Amplitude: 0.875 V
Lead Channel Setting Pacing Amplitude: 2 V
Lead Channel Setting Pacing Amplitude: 2.5 V
Lead Channel Setting Pacing Pulse Width: 0.4 ms
Lead Channel Setting Sensing Sensitivity: 2.8 mV
MDC IDC LEAD LOCATION: 753859
MDC IDC LEAD SERIAL: 553665
MDC IDC MSMT BATTERY VOLTAGE: 2.78 V
MDC IDC MSMT LEADCHNL RA IMPEDANCE VALUE: 475 Ohm
MDC IDC MSMT LEADCHNL RA PACING THRESHOLD AMPLITUDE: 0.5 V
MDC IDC MSMT LEADCHNL RA PACING THRESHOLD PULSEWIDTH: 0.4 ms
MDC IDC MSMT LEADCHNL RV PACING THRESHOLD PULSEWIDTH: 0.4 ms
MDC IDC PG IMPLANT DT: 20160518
MDC IDC SESS DTM: 20180828124128
MDC IDC STAT BRADY AP VP PERCENT: 62 %
MDC IDC STAT BRADY AP VS PERCENT: 1 %
MDC IDC STAT BRADY AS VP PERCENT: 37 %

## 2017-01-02 ENCOUNTER — Encounter: Payer: Self-pay | Admitting: Cardiology

## 2017-01-19 ENCOUNTER — Telehealth: Payer: Self-pay | Admitting: Cardiovascular Disease

## 2017-01-19 NOTE — Telephone Encounter (Signed)
Pt is calling to say that around the mid of December, he will be calling back to request about a week or 2 of Ranexa samples, to get him through to the beginning of the year.  Pt states that he will run out of this med then, and will then be in the doughnut hole, so he is requesting samples for 2 weeks at that time, to get him through to the new year. This is just an FYI to Dr Sanmina-SCI RN.  He said he remembered this today, and wanted to call on this issue.  Informed the pt that Dr Antionette Char RN is out of the office today, but I will route this request to her as an FYI.  Pt verbalized understanding and agrees with this plan.

## 2017-01-19 NOTE — Telephone Encounter (Signed)
New message    Pt is calling asking for a call back. He said it's about his renexa.

## 2017-01-23 DIAGNOSIS — Z6826 Body mass index (BMI) 26.0-26.9, adult: Secondary | ICD-10-CM | POA: Diagnosis not present

## 2017-01-23 DIAGNOSIS — M542 Cervicalgia: Secondary | ICD-10-CM | POA: Diagnosis not present

## 2017-01-28 ENCOUNTER — Telehealth: Payer: Self-pay | Admitting: *Deleted

## 2017-01-28 ENCOUNTER — Telehealth: Payer: Self-pay | Admitting: Internal Medicine

## 2017-01-28 NOTE — Telephone Encounter (Signed)
New message    Pt is calling.   Pt c/o of Chest Pain: STAT if CP now or developed within 24 hours  1. Are you having CP right now? No. Only on Saturday and Sunday when he was doing what he was doing.   2. Are you experiencing any other symptoms (ex. SOB, nausea, vomiting, sweating)? No, just the pain and pressure.  3. How long have you been experiencing CP? On Saturday and Sunday, only when he was doing what he was doing.  4. Is your CP continuous or coming and going? Coming and going  5. Have you taken Nitroglycerin? No  ?

## 2017-01-28 NOTE — Telephone Encounter (Signed)
Dr. Burt Knack is primary cards.

## 2017-01-28 NOTE — Telephone Encounter (Signed)
Patient requested ranexa samples. He is aware that I can place two weeks worth at the front desk.

## 2017-01-28 NOTE — Telephone Encounter (Signed)
Patient reports he felt a strange feeling in his chest on Sunday. He states he calls it "chest pain" for lack of a better word. He "knows what angina is and that was not it." He had no SOB, palpitations, nausea, vomiting or any other symptoms.  The feeling has not returned since and is currently asymptomatic. He came to the office today to pick up some samples and left some notes at the front desk.  He would like the [facility] to look at his notes and see if the notes correlate to any arrhythmia.  He requests a call back to discuss once reviewed.

## 2017-01-29 NOTE — Telephone Encounter (Signed)
Spoke with pt and informed him that he would need to send in a manual transmission from his home monitor in order to see if there were any arrhythmias. Pt stated that he was in [place] and did not have his home monitor that it might be Monday before he was back home. Informed pt to send in manual transmission and then to call the office, pt voiced understanding.

## 2017-02-03 DIAGNOSIS — M542 Cervicalgia: Secondary | ICD-10-CM | POA: Diagnosis not present

## 2017-02-03 DIAGNOSIS — Z6826 Body mass index (BMI) 26.0-26.9, adult: Secondary | ICD-10-CM | POA: Diagnosis not present

## 2017-02-03 DIAGNOSIS — S161XXS Strain of muscle, fascia and tendon at neck level, sequela: Secondary | ICD-10-CM | POA: Diagnosis not present

## 2017-02-24 DIAGNOSIS — Z85828 Personal history of other malignant neoplasm of skin: Secondary | ICD-10-CM | POA: Diagnosis not present

## 2017-02-24 DIAGNOSIS — L570 Actinic keratosis: Secondary | ICD-10-CM | POA: Diagnosis not present

## 2017-02-24 DIAGNOSIS — B351 Tinea unguium: Secondary | ICD-10-CM | POA: Diagnosis not present

## 2017-02-24 DIAGNOSIS — C44319 Basal cell carcinoma of skin of other parts of face: Secondary | ICD-10-CM | POA: Diagnosis not present

## 2017-02-24 DIAGNOSIS — D1801 Hemangioma of skin and subcutaneous tissue: Secondary | ICD-10-CM | POA: Diagnosis not present

## 2017-02-24 DIAGNOSIS — D2372 Other benign neoplasm of skin of left lower limb, including hip: Secondary | ICD-10-CM | POA: Diagnosis not present

## 2017-02-24 DIAGNOSIS — L821 Other seborrheic keratosis: Secondary | ICD-10-CM | POA: Diagnosis not present

## 2017-02-24 DIAGNOSIS — L218 Other seborrheic dermatitis: Secondary | ICD-10-CM | POA: Diagnosis not present

## 2017-02-24 DIAGNOSIS — D485 Neoplasm of uncertain behavior of skin: Secondary | ICD-10-CM | POA: Diagnosis not present

## 2017-02-24 DIAGNOSIS — L814 Other melanin hyperpigmentation: Secondary | ICD-10-CM | POA: Diagnosis not present

## 2017-03-11 DIAGNOSIS — I25118 Atherosclerotic heart disease of native coronary artery with other forms of angina pectoris: Secondary | ICD-10-CM | POA: Diagnosis not present

## 2017-03-11 DIAGNOSIS — E7849 Other hyperlipidemia: Secondary | ICD-10-CM | POA: Diagnosis not present

## 2017-03-11 DIAGNOSIS — C44300 Unspecified malignant neoplasm of skin of unspecified part of face: Secondary | ICD-10-CM | POA: Diagnosis not present

## 2017-03-11 DIAGNOSIS — N183 Chronic kidney disease, stage 3 (moderate): Secondary | ICD-10-CM | POA: Diagnosis not present

## 2017-03-11 DIAGNOSIS — S161XXS Strain of muscle, fascia and tendon at neck level, sequela: Secondary | ICD-10-CM | POA: Diagnosis not present

## 2017-03-11 DIAGNOSIS — I10 Essential (primary) hypertension: Secondary | ICD-10-CM | POA: Diagnosis not present

## 2017-03-11 DIAGNOSIS — M1990 Unspecified osteoarthritis, unspecified site: Secondary | ICD-10-CM | POA: Diagnosis not present

## 2017-03-11 DIAGNOSIS — Z6826 Body mass index (BMI) 26.0-26.9, adult: Secondary | ICD-10-CM | POA: Diagnosis not present

## 2017-03-24 ENCOUNTER — Ambulatory Visit (INDEPENDENT_AMBULATORY_CARE_PROVIDER_SITE_OTHER): Payer: Medicare HMO | Admitting: *Deleted

## 2017-03-24 ENCOUNTER — Telehealth: Payer: Self-pay | Admitting: Cardiology

## 2017-03-24 DIAGNOSIS — I442 Atrioventricular block, complete: Secondary | ICD-10-CM

## 2017-03-24 NOTE — Telephone Encounter (Signed)
LMOVM reminding pt to send remote transmission.   

## 2017-03-25 NOTE — Progress Notes (Signed)
Remote pacemaker transmission.   

## 2017-03-26 DIAGNOSIS — E785 Hyperlipidemia, unspecified: Secondary | ICD-10-CM | POA: Diagnosis not present

## 2017-03-26 DIAGNOSIS — I129 Hypertensive chronic kidney disease with stage 1 through stage 4 chronic kidney disease, or unspecified chronic kidney disease: Secondary | ICD-10-CM | POA: Diagnosis not present

## 2017-03-26 DIAGNOSIS — M109 Gout, unspecified: Secondary | ICD-10-CM | POA: Diagnosis not present

## 2017-03-26 DIAGNOSIS — D696 Thrombocytopenia, unspecified: Secondary | ICD-10-CM | POA: Diagnosis not present

## 2017-03-26 DIAGNOSIS — M329 Systemic lupus erythematosus, unspecified: Secondary | ICD-10-CM | POA: Diagnosis not present

## 2017-03-26 DIAGNOSIS — R809 Proteinuria, unspecified: Secondary | ICD-10-CM | POA: Diagnosis not present

## 2017-03-26 DIAGNOSIS — N183 Chronic kidney disease, stage 3 (moderate): Secondary | ICD-10-CM | POA: Diagnosis not present

## 2017-03-26 LAB — CUP PACEART REMOTE DEVICE CHECK
Battery Impedance: 135 Ohm
Battery Remaining Longevity: 121 mo
Brady Statistic AP VS Percent: 1 %
Brady Statistic AS VP Percent: 43 %
Brady Statistic AS VS Percent: 0 %
Date Time Interrogation Session: 20181127203756
Implantable Lead Implant Date: 20070831
Implantable Lead Model: 4469
Implantable Pulse Generator Implant Date: 20160518
Lead Channel Impedance Value: 428 Ohm
Lead Channel Impedance Value: 475 Ohm
Lead Channel Pacing Threshold Amplitude: 0.5 V
Lead Channel Pacing Threshold Pulse Width: 0.4 ms
Lead Channel Pacing Threshold Pulse Width: 0.4 ms
Lead Channel Setting Sensing Sensitivity: 2.8 mV
MDC IDC LEAD IMPLANT DT: 20070831
MDC IDC LEAD LOCATION: 753859
MDC IDC LEAD LOCATION: 753860
MDC IDC LEAD SERIAL: 485949
MDC IDC LEAD SERIAL: 553665
MDC IDC MSMT BATTERY VOLTAGE: 2.78 V
MDC IDC MSMT LEADCHNL RA SENSING INTR AMPL: 1.4 mV
MDC IDC MSMT LEADCHNL RV PACING THRESHOLD AMPLITUDE: 0.75 V
MDC IDC SET LEADCHNL RA PACING AMPLITUDE: 2 V
MDC IDC SET LEADCHNL RV PACING AMPLITUDE: 2.5 V
MDC IDC SET LEADCHNL RV PACING PULSEWIDTH: 0.4 ms
MDC IDC STAT BRADY AP VP PERCENT: 56 %

## 2017-03-27 ENCOUNTER — Encounter: Payer: Self-pay | Admitting: Cardiology

## 2017-04-08 ENCOUNTER — Other Ambulatory Visit: Payer: Self-pay | Admitting: Cardiovascular Disease

## 2017-04-08 ENCOUNTER — Telehealth: Payer: Self-pay

## 2017-04-08 ENCOUNTER — Telehealth: Payer: Self-pay | Admitting: Cardiovascular Disease

## 2017-04-08 NOTE — Telephone Encounter (Signed)
New message    Pt came in this morning because he could not get through the phones.   Pt c/o Shortness Of Breath: STAT if SOB developed within the last 24 hours or pt is noticeably SOB on the phone  1. Are you currently SOB (can you hear that pt is SOB on the phone)? No, pt states he is fine now.  2. How long have you been experiencing SOB? He said he had it last week while he was on vacation skiing.   3. Are you SOB when sitting or when up moving around? Up moving around--while skiing.  4. Are you currently experiencing any other symptoms? Pt states he also had chest pain during skiing.  Pt c/o of Chest Pain: STAT if CP now or developed within 24 hours  1. Are you having CP right now? No   2. Are you experiencing any other symptoms (ex. SOB, nausea, vomiting, sweating)? sob  3. How long have you been experiencing CP? Pt states he had last week during vacation while skiing  4. Is your CP continuous or coming and going? Coming and going  5. Have you taken Nitroglycerin? Pt states he did take nitro. ?

## 2017-04-08 NOTE — Telephone Encounter (Signed)
SENT NOTES TO SCHEDULING 

## 2017-04-08 NOTE — Telephone Encounter (Signed)
Patient states he went to [place] last week for a ski trip. The conditions were perfect for skiing, but he couldn't ski like he's used to because he got too SOB and slight chest pressure. He took some SL NTG and rested it "seemed to help." He is currently asymptomatic.  He has no VS to report. He is taking medications as directed. He requests to be checked out by Dr. Burt Knack.  Scheduled patient to see Dr. Burt Knack tomorrow. He was grateful for call and agrees with treatment plan.

## 2017-04-09 ENCOUNTER — Encounter: Payer: Self-pay | Admitting: Cardiovascular Disease

## 2017-04-09 ENCOUNTER — Ambulatory Visit: Payer: Medicare HMO | Admitting: Cardiovascular Disease

## 2017-04-09 VITALS — BP 124/74 | HR 60 | Ht 69.0 in | Wt 178.8 lb

## 2017-04-09 DIAGNOSIS — I25119 Atherosclerotic heart disease of native coronary artery with unspecified angina pectoris: Secondary | ICD-10-CM

## 2017-04-09 DIAGNOSIS — I208 Other forms of angina pectoris: Secondary | ICD-10-CM

## 2017-04-09 NOTE — Patient Instructions (Addendum)
Medication Instructions:  Your provider recommends that you continue on your current medications as directed. Please refer to the Current Medication list given to you today.    Labwork: None today.  Testing/Procedures: Your physician has requested that you have a cardiac catheterization. Cardiac catheterization is used to diagnose and/or treat various heart conditions. Doctors may recommend this procedure for a number of different reasons. The most common reason is to evaluate chest pain. Chest pain can be a symptom of coronary artery disease (CAD), and cardiac catheterization can show whether plaque is narrowing or blocking your heart's arteries. This procedure is also used to evaluate the valves, as well as measure the blood flow and oxygen levels in different parts of your heart. For further information please visit [email or web address]. Please follow instruction sheet, as given.  Follow-Up: Your provider recommends that you schedule a follow-up appointment AS NEEDED pending cath results.  Any Other Special Instructions Will Be Listed Below (If Applicable).   [facility] [address] [place] [address] Dept: [phone] Loc: [place]  04/09/2017  You are scheduled for a Cardiac Catheterization on Tuesday, December 18 with Dr. [name].  1. Please arrive at the [facility] (Main Entrance A) at [facility]: [place], [place] [address] at 9:00 AM (two hours before your procedure to ensure your preparation). Free valet parking service is available.   Special note: Every effort is made to have your procedure done on time. Please understand that emergencies sometimes delay scheduled procedures.  2. Diet: Do not eat or drink anything after midnight prior to your procedure except sips of water to take medications.  3. Labs: Your labs will be  performed at the hospital after you arrive for your procedure.  4. Medication instructions in preparation for your procedure:  1) HOLD LOSARTAN the day before and day of your catheterization  2) MAKE SURE TO TAKE YOUR ASPIRIN AND PLAVIX  3) HOLD LASIX the morning of your catheterization  5. Plan for one night stay--bring personal belongings. 6. Bring a current list of your medications and current insurance cards. 7. You MUST have a responsible person to drive you home. 8. Someone MUST be with you the first 24 hours after you arrive home or your discharge will be delayed. 9. Please wear clothes that are easy to get on and off and wear slip-on shoes.  Thank you for allowing [place] to care for you!   -- [facility]     If you need a refill on your cardiac medications before your next appointment, please call your pharmacy.

## 2017-04-09 NOTE — H&P (View-Only) (Signed)
Cardiology Office Note Date:  04/10/2017   ID:  Kyle Avila, DOB [birth year], MRN [id number]  PCP:  [name], MD  Cardiologist:  [name], MD    Chief Complaint  Patient presents with  . Follow-up    CAD     History of Present Illness: Kyle Avila is a 71 y.o. male who presents for follow-up of CAD.    He has a history of CAD status post CABG in 1988 and redo bypass in 1996, heart block status post pacemaker, HTN, HL, CKD, carotid stenosis status post right CEA. Cardiac catheterization in 2009 demonstrated severe disease in the LCx and he was treated with overlapping drug-eluting stents.  He is here alone today. Notes he has been more short of breath. He was able to shovel snow this week without angina, but admits to more frequent chest discomfort. However, he has been having more shortness of breath with activity. He recently went skiing and had a lot more trouble with his breathing than he has in the past. On the way back to [facility], he had to stop and rest frequently in the airport, and required NTG x 2 for chest pressure, relieving his symptoms fairly promptly. Also complains of increased fatigue. No resting chest pain reported.    Past Medical History:  Diagnosis Date  . CAD (coronary artery disease)    s/p CABG twice, PCI twice  . Carotid artery occlusion    s/p CEA  . Chronic renal insufficiency    stage 3  . Exertional angina ([place]) 11/03/2014  . Gout   . Heart block    s/p PTVDP  . HTN (hypertension)   . Hx of cardiovascular stress test    Adenosine Myoview (11/15):  Inferolateral and anterolateral infarct without significant ischemia, EF 42% - Intermediate Risk  . Hyperlipidemia   . Mitral insufficiency   . Myocardial infarction ([place])   . Presence of permanent cardiac pacemaker   . 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 ACHILLES TENDON REPAIR    . CARDIAC  CATHETERIZATION    . CARDIAC CATHETERIZATION N/A 11/03/2014   Procedure: Left Heart Cath and Cors/Grafts Angiography;  Surgeon: [name], MD;  Location: [facility];  Service: Cardiovascular;  Laterality: N/A;  . CARDIAC CATHETERIZATION N/A 11/03/2014   Procedure: Coronary Stent Intervention;  Surgeon: [name], MD;  Location: [facility];  Service: Cardiovascular;  Laterality: N/A;  . CAROTID ENDARTERECTOMY  2002  . CORONARY ARTERY BYPASS GRAFT     with redo cabg  . CORONARY STENT PLACEMENT  11/03/2014   SVG PVA  . EP IMPLANTABLE DEVICE N/A 09/13/2014   Procedure: PPM Generator Changeout;  Surgeon: Deboraha Sprang, MD;  Location: [facility];  Service: Cardiovascular;  Laterality: N/A;  . PACEMAKER INSERTION  2007    Current Outpatient Medications  Medication Sig Dispense Refill  . allopurinol (ZYLOPRIM) 100 MG tablet Take 100 mg by mouth daily.     Marland Kitchen amLODipine (NORVASC) 10 MG tablet Take 5 mg by mouth daily.     Marland Kitchen aspirin EC 81 MG EC tablet Take 81 mg by mouth daily.  1 tablet 0  . clopidogrel (PLAVIX) 75 MG tablet Take 1 tablet (75 mg total) by mouth daily. . 90 tablet 2  . colchicine 0.6 MG tablet Take 0.6 mg by mouth daily as needed (gout flare).     . furosemide (LASIX) 40 MG tablet  Take 40 mg by mouth daily.     . GUAIFENESIN PO Take 400 mg by mouth daily.     . isosorbide mononitrate (IMDUR) 60 MG 24 hr tablet Take 60 mg by mouth daily.    Marland Kitchen ketoconazole (NIZORAL) 2 % cream Apply 1 application topically 2 (two) times daily as needed for irritation (itching).     Marland Kitchen losartan (COZAAR) 100 MG tablet Take 1 tablet (100 mg total) by mouth 2 (two) times daily. 180 tablet 3  . meclizine (ANTIVERT) 25 MG tablet Take 25 mg by mouth 3 (three) times daily as needed for dizziness.     . metoprolol succinate (TOPROL-XL) 50 MG 24 hr tablet Take 50 mg by mouth 2 (two) times daily. Verified that it is succinate twice daily    . nitroGLYCERIN (NITROSTAT) 0.4 MG SL  tablet PLACE ONE TABLET UNDER THE TONGUE EVERY FIVE MINUTES AS NEEDED FOR CHEST PAIN (Patient taking differently: Place 0.4 mg under the tongue every 5 (five) minutes as needed for chest pain. PLACE ONE TABLET UNDER THE TONGUE EVERY FIVE MINUTES AS NEEDED FOR CHEST PAIN) 25 tablet 3  . ranolazine (RANEXA) 1000 MG SR tablet Take 1 tablet (1,000 mg total) by mouth 2 (two) times daily. 180 tablet 3  . rosuvastatin (CRESTOR) 10 MG tablet Take 1 tablet (10 mg total) by mouth daily. (Patient taking differently: Take 10 mg by mouth every evening. ) 90 tablet 3  . tamsulosin (FLOMAX) 0.4 MG CAPS 0.4 mg daily. Take 0.4 mg by mouth once daily    . Multiple Vitamins-Minerals (PRESERVISION AREDS 2 PO) Take by mouth 2 times daily at 12 noon and 4 pm.     No current facility-administered medications for this visit.     Allergies:   Patient has no known allergies.   Social History:  The patient  reports that  has never smoked. he has never used smokeless tobacco. He reports that he drinks alcohol. He reports that he does not use drugs.   Family History:  [name] family history includes Cancer in his mother; Heart attack in his father; Heart disease in his father.    ROS:  Please see the history of present illness.  Otherwise, review of systems is positive for leg swelling.  All other systems are reviewed and negative.    PHYSICAL EXAM: VS:  BP 124/74   Pulse 60   Ht _0  (1.753 m)   Wt 178 lb 12.8 oz (81.1 kg)   BMI 26.40 kg/m  , BMI Body mass index is 26.4 kg/m. GEN: Well nourished, well developed, in no acute distress  HEENT: normal  Neck: no JVD, no masses. No carotid bruits Cardiac: RRR without murmur or gallop                Respiratory:  clear to auscultation bilaterally, normal work of breathing GI: soft, nontender, nondistended, + BS MS: no deformity or atrophy  Ext: no pretibial edema, pedal pulses 2+= bilaterally Skin: warm and dry, no rash Neuro:  Strength and sensation are  intact Psych: euthymic mood, full affect  EKG:  EKG is ordered today. The ekg ordered today shows AV sequential pacing 60 bpm  Recent Labs: No results found for requested labs within last 8760 hours.   Lipid Panel     Component Value Date/Time   CHOL 159 02/12/2016 0803   TRIG 130 02/12/2016 0803   HDL 46 02/12/2016 0803   CHOLHDL 3.5 02/12/2016 0803   VLDL 26  02/12/2016 0803   LDLCALC 87 02/12/2016 0803      Wt Readings from Last 3 Encounters:  04/09/17 178 lb 12.8 oz (81.1 kg)  10/02/16 172 lb 1.9 oz (78.1 kg)  06/24/16 171 lb 9.6 oz (77.8 kg)     Cardiac Studies Reviewed: 2D Echo 06-24-2016: Study Conclusions  - Left ventricle: The cavity size was normal. There was mild   concentric hypertrophy. Systolic function was mildly reduced. The   estimated ejection fraction was in the range of 45% to 50%.   Hypokinesis of the inferolateral, inferior, and inferoseptal   myocardium. Doppler parameters are consistent with abnormal left   ventricular relaxation (grade 1 diastolic dysfunction). Doppler   parameters are consistent with high ventricular filling pressure. - Aortic valve: Transvalvular velocity was within the normal range.   There was no stenosis. There was no regurgitation. - Mitral valve: Transvalvular velocity was within the normal range.   There was no evidence for stenosis. There was mild regurgitation. - Left atrium: The atrium was mildly dilated. - Right ventricle: The cavity size was normal. Wall thickness was   normal. Systolic function was mildly reduced. - Atrial septum: No defect or patent foramen ovale was identified   by color flow Doppler. - Tricuspid valve: There was mild regurgitation. - Pulmonary arteries: Systolic pressure was within the normal   range. PA peak pressure: 32 mm Hg (S).  Cardiac Cath 11-03-2014: Procedures   Coronary Stent Intervention  Left Heart Cath and Cors/Grafts Angiography  Conclusion   1. SVG was injected is  large. 2. SVG was not injected . 3. Known to be occluded 4. Prox Graft lesion, 100% stenosed. 5. LIMA was injected is normal in caliber, and is anatomically normal. 6. LM lesion, 100% stenosed. 7. Ost Cx to Mid Cx lesion, 20% stenosed. A drug-eluting stent was placed. The lesion was previously treated with a drug-eluting stent greater than two years ago. 8. Ost RCA lesion, 100% stenosed. 9. Dist Graft lesion, 95% stenosed. There is a 0% residual stenosis post intervention. 10. A drug-eluting stent was placed. 11. 1st Mrg lesion, 100% stenosed. 12. Mid LAD to Dist LAD lesion, 40% stenosed.   Final conclusion:  Severe native three-vessel coronary artery disease with total occlusion of the proximal LAD and total occlusion of the proximal RCA  Continued patency of the stented segment extending from the left mainstem into the left circumflex  Continued patency of the LIMA to LAD graft  Continued patency of the saphenous vein graft to right PDA, with severe stenosis in the distal body of the graft treated successfully with a drug-eluting stent platform using distal embolic protection  Recommend continue dual antiplatelets therapy indefinitely with aspirin and clopidogrel. As long as kidney function is stable, anticipate hospital discharge tomorrow. [name] losartan has been held and will be resumed if kidney function is stable.   Indications   Exertional angina (HCC) [I20.8 ([id number])]  Complications   Complications documented in old activity   INDICATION: CCS class III angina on maximal medical therapy. 71 year old gentleman with extensive CAD, 2 previous bypass surgeries, and progressive exertional angina.   PROCEDURAL DETAILS: The left wrist was prepped, draped, and anesthetized with 1% lidocaine. Using the modified Seldinger technique, a 5/6 French Slender sheath was introduced into the left radial artery. 3 mg of verapamil was administered through the sheath, weight-based  unfractionated heparin was administered intravenously. Standard Judkins catheters were used for selective coronary angiography and bypass graft angiography. Ventriculography is deferred because of chronic  kidney disease. A JR4 catheter was used to image the LIMA graft, but a LIMA catheter would probably fit better. For the RCA graft, a multipurpose catheter is used. Catheter exchanges were performed over an exchange length guidewire. There were no immediate procedural complications. See below for description of PCI procedure. A TR band was used for radial hemostasis at the completion of the procedure. The patient was transferred to the post catheterization recovery area for further monitoring.There were no immediate complications during the procedure.      Coronary Findings   Diagnostic  Dominance: Right  Left Main  LM lesion 100% stenosed  calcified .  Left Anterior Descending  Mid LAD to Dist LAD lesion 40% stenosed  There is a 40% lesion in the LAD just beyond the graft insertion site. This does not appear to be flow-limiting.  Ramus Intermedius  Collaterals  Ramus filled by collaterals from 2nd Sept.    Left Circumflex  Ost Cx to Mid Cx lesion 20% stenosed  diffuse . The lesion was previously treated with a drug-eluting stent greater than two years ago.  First Obtuse Marginal Branch  1st Mrg lesion 100% stenosed  1st Mrg lesion.  Right Coronary Artery  Ost RCA lesion 100% stenosed  Ost RCA lesion.  Free Graft to RPDA  SVG was injected is large.  Dist Graft lesion 95% stenosed  discrete .  Free Graft to 1st Mrg  SVG was not injected . Known to be occluded  Prox Graft lesion 100% stenosed  Prox Graft lesion.  Free LIMA Graft to Mid LAD  LIMA was injected is normal in caliber, and is anatomically normal.  Intervention   Dist Graft lesion (Free Graft to RPDA)  PCI  The pre-interventional distal flow is normal (TIMI 3). No pre-stent angioplasty was performed. A drug-eluting  stent was placed. No post-stent angioplasty was performed. Maximum pressure: 14 atm. The post-interventional distal flow is normal (TIMI 3). The intervention was successful. No complications occurred at this lesion. There is severe stenosis of the SVG to PDA in the distal body of the graft. Heparin is used for anticoagulation. The patient has been on long-term aspirin and Plavix. A therapeutic ACT was achieved. A 6 [place] multipurpose guide catheter was used. A cougar wire is used to cross the lesion without difficulty. A 5 mm spider devices used for distal embolic protection. The lesion is primarily stented with a 4.0 x 15 mm Xience DES to 14 atm. following PCI, there is 0% residual stenosis and TIMI-3 flow.  There is no residual stenosis post intervention.  Coronary Diagrams   Diagnostic Diagram       Post-Intervention Diagram       Implants     Permanent Stent  Stent Xience Alpine Rx 4.0x15 - [id number] - Implanted    Inventory item: Stent Xience Alpine Rx 4.0x15 Model/Cat number: [id number]  Manufacturer: [employer] Lot number: [id number]  Device identifier: [id number] Device identifier type: GS1     ASSESSMENT AND PLAN: 1.  CAD, native vessel and bypass graft disease, with progressive CCS Class 3 angina on maximal medical therapy (amlodipine, ranolazine, isosorbide, and metoprolol succinate). Reviewed last cath films from 2016 demonstrating patent LIMA-LAD, patent native LCx/OM, and severe stenosis in the SVG-RCA treated with a DES. 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 Discussed options - he is on maximal Rx. Concerned about his risk of SVG closure. Recommend cath possible PCI. I have reviewed the risks, indications, and alternatives to cardiac catheterization, possible angioplasty, and  stenting with the patient. Risks include but are not limited to bleeding, infection, vascular injury, stroke, myocardial infection, arrhythmia, kidney injury, radiation-related injury  in the case of prolonged fluoroscopy use, emergency cardiac surgery, and death. The patient understands the risks of serious complication is 1-2 in 7290 with diagnostic cardiac cath and 1-2% or less with angioplasty/stenting. He also understands the increased risk of AKI on chronic kidney disease. His most recent creatinine is 2.28 mg/dL at Dr [employer] office. Will ask him to hold losartan the day before and day of cath, and will bring him in early for fluid hydration. Will limit contrast load as much as possible.   2. HTN: controlled on multidrug Rx.   3. Hyperlipidemia: treated with a high-intensity statin drug.   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   5. CKD 3: see above  Current medicines are reviewed with the patient today.  The patient does not have concerns regarding medicines.  Labs/ tests ordered today include:   Orders Placed This Encounter  Procedures  . EKG 12-Lead    Disposition:   FU pending cath results  Signed, [name], MD  04/10/2017 3:15 PM    [facility] [address], [place], [place]  [address] Phone: [phone]; Fax: [phone]

## 2017-04-09 NOTE — Progress Notes (Signed)
Cardiology Office Note Date:  04/10/2017   ID:  Kyle Avila, DOB [birth year], MRN [id number]  PCP:  [name], MD  Cardiologist:  [name], MD    Chief Complaint  Patient presents with  . Follow-up    CAD     History of Present Illness: Kyle Avila is a 71 y.o. male who presents for follow-up of CAD.    He has a history of CAD status post CABG in 1988 and redo bypass in 1996, heart block status post pacemaker, HTN, HL, CKD, carotid stenosis status post right CEA. Cardiac catheterization in 2009 demonstrated severe disease in the LCx and he was treated with overlapping drug-eluting stents.  He is here alone today. Notes he has been more short of breath. He was able to shovel snow this week without angina, but admits to more frequent chest discomfort. However, he has been having more shortness of breath with activity. He recently went skiing and had a lot more trouble with his breathing than he has in the past. On the way back to [facility], he had to stop and rest frequently in the airport, and required NTG x 2 for chest pressure, relieving his symptoms fairly promptly. Also complains of increased fatigue. No resting chest pain reported.    Past Medical History:  Diagnosis Date  . CAD (coronary artery disease)    s/p CABG twice, PCI twice  . Carotid artery occlusion    s/p CEA  . Chronic renal insufficiency    stage 3  . Exertional angina ([place]) 11/03/2014  . Gout   . Heart block    s/p PTVDP  . HTN (hypertension)   . Hx of cardiovascular stress test    Adenosine Myoview (11/15):  Inferolateral and anterolateral infarct without significant ischemia, EF 42% - Intermediate Risk  . Hyperlipidemia   . Mitral insufficiency   . Myocardial infarction ([place])   . Presence of permanent cardiac pacemaker   . 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 ACHILLES TENDON REPAIR    . CARDIAC  CATHETERIZATION    . CARDIAC CATHETERIZATION N/A 11/03/2014   Procedure: Left Heart Cath and Cors/Grafts Angiography;  Surgeon: [name], MD;  Location: [place];  Service: Cardiovascular;  Laterality: N/A;  . CARDIAC CATHETERIZATION N/A 11/03/2014   Procedure: Coronary Stent Intervention;  Surgeon: [name], MD;  Location: [place] [facility];  Service: Cardiovascular;  Laterality: N/A;  . CAROTID ENDARTERECTOMY  2002  . CORONARY ARTERY BYPASS GRAFT     with redo cabg  . CORONARY STENT PLACEMENT  11/03/2014   SVG PVA  . EP IMPLANTABLE DEVICE N/A 09/13/2014   Procedure: PPM Generator Changeout;  Surgeon: Deboraha Sprang, MD;  Location: [facility];  Service: Cardiovascular;  Laterality: N/A;  . PACEMAKER INSERTION  2007    Current Outpatient Medications  Medication Sig Dispense Refill  . allopurinol (ZYLOPRIM) 100 MG tablet Take 100 mg by mouth daily.     Marland Kitchen amLODipine (NORVASC) 10 MG tablet Take 5 mg by mouth daily.     Marland Kitchen aspirin EC 81 MG EC tablet Take 81 mg by mouth daily.  1 tablet 0  . clopidogrel (PLAVIX) 75 MG tablet Take 1 tablet (75 mg total) by mouth daily. . 90 tablet 2  . colchicine 0.6 MG tablet Take 0.6 mg by mouth daily as needed (gout flare).     . furosemide (LASIX) 40 MG tablet  Take 40 mg by mouth daily.     . GUAIFENESIN PO Take 400 mg by mouth daily.     . isosorbide mononitrate (IMDUR) 60 MG 24 hr tablet Take 60 mg by mouth daily.    Marland Kitchen ketoconazole (NIZORAL) 2 % cream Apply 1 application topically 2 (two) times daily as needed for irritation (itching).     Marland Kitchen losartan (COZAAR) 100 MG tablet Take 1 tablet (100 mg total) by mouth 2 (two) times daily. 180 tablet 3  . meclizine (ANTIVERT) 25 MG tablet Take 25 mg by mouth 3 (three) times daily as needed for dizziness.     . metoprolol succinate (TOPROL-XL) 50 MG 24 hr tablet Take 50 mg by mouth 2 (two) times daily. Verified that it is succinate twice daily    . nitroGLYCERIN (NITROSTAT) 0.4 MG SL  tablet PLACE ONE TABLET UNDER THE TONGUE EVERY FIVE MINUTES AS NEEDED FOR CHEST PAIN (Patient taking differently: Place 0.4 mg under the tongue every 5 (five) minutes as needed for chest pain. PLACE ONE TABLET UNDER THE TONGUE EVERY FIVE MINUTES AS NEEDED FOR CHEST PAIN) 25 tablet 3  . ranolazine (RANEXA) 1000 MG SR tablet Take 1 tablet (1,000 mg total) by mouth 2 (two) times daily. 180 tablet 3  . rosuvastatin (CRESTOR) 10 MG tablet Take 1 tablet (10 mg total) by mouth daily. (Patient taking differently: Take 10 mg by mouth every evening. ) 90 tablet 3  . tamsulosin (FLOMAX) 0.4 MG CAPS 0.4 mg daily. Take 0.4 mg by mouth once daily    . Multiple Vitamins-Minerals (PRESERVISION AREDS 2 PO) Take by mouth 2 times daily at 12 noon and 4 pm.     No current facility-administered medications for this visit.     Allergies:   Patient has no known allergies.   Social History:  The patient  reports that  has never smoked. he has never used smokeless tobacco. He reports that he drinks alcohol. He reports that he does not use drugs.   Family History:  [name] family history includes Cancer in his mother; Heart attack in his father; Heart disease in his father.    ROS:  Please see the history of present illness.  Otherwise, review of systems is positive for leg swelling.  All other systems are reviewed and negative.    PHYSICAL EXAM: VS:  BP 124/74   Pulse 60   Ht _0  (1.753 m)   Wt 178 lb 12.8 oz (81.1 kg)   BMI 26.40 kg/m  , BMI Body mass index is 26.4 kg/m. GEN: Well nourished, well developed, in no acute distress  HEENT: normal  Neck: no JVD, no masses. No carotid bruits Cardiac: RRR without murmur or gallop                Respiratory:  clear to auscultation bilaterally, normal work of breathing GI: soft, nontender, nondistended, + BS MS: no deformity or atrophy  Ext: no pretibial edema, pedal pulses 2+= bilaterally Skin: warm and dry, no rash Neuro:  Strength and sensation are  intact Psych: euthymic mood, full affect  EKG:  EKG is ordered today. The ekg ordered today shows AV sequential pacing 60 bpm  Recent Labs: No results found for requested labs within last 8760 hours.   Lipid Panel     Component Value Date/Time   CHOL 159 02/12/2016 0803   TRIG 130 02/12/2016 0803   HDL 46 02/12/2016 0803   CHOLHDL 3.5 02/12/2016 0803   VLDL 26  02/12/2016 0803   LDLCALC 87 02/12/2016 0803      Wt Readings from Last 3 Encounters:  04/09/17 178 lb 12.8 oz (81.1 kg)  10/02/16 172 lb 1.9 oz (78.1 kg)  06/24/16 171 lb 9.6 oz (77.8 kg)     Cardiac Studies Reviewed: 2D Echo 06-24-2016: Study Conclusions  - Left ventricle: The cavity size was normal. There was mild   concentric hypertrophy. Systolic function was mildly reduced. The   estimated ejection fraction was in the range of 45% to 50%.   Hypokinesis of the inferolateral, inferior, and inferoseptal   myocardium. Doppler parameters are consistent with abnormal left   ventricular relaxation (grade 1 diastolic dysfunction). Doppler   parameters are consistent with high ventricular filling pressure. - Aortic valve: Transvalvular velocity was within the normal range.   There was no stenosis. There was no regurgitation. - Mitral valve: Transvalvular velocity was within the normal range.   There was no evidence for stenosis. There was mild regurgitation. - Left atrium: The atrium was mildly dilated. - Right ventricle: The cavity size was normal. Wall thickness was   normal. Systolic function was mildly reduced. - Atrial septum: No defect or patent foramen ovale was identified   by color flow Doppler. - Tricuspid valve: There was mild regurgitation. - Pulmonary arteries: Systolic pressure was within the normal   range. PA peak pressure: 32 mm Hg (S).  Cardiac Cath 11-03-2014: Procedures   Coronary Stent Intervention  Left Heart Cath and Cors/Grafts Angiography  Conclusion   1. SVG was injected is  large. 2. SVG was not injected . 3. Known to be occluded 4. Prox Graft lesion, 100% stenosed. 5. LIMA was injected is normal in caliber, and is anatomically normal. 6. LM lesion, 100% stenosed. 7. Ost Cx to Mid Cx lesion, 20% stenosed. A drug-eluting stent was placed. The lesion was previously treated with a drug-eluting stent greater than two years ago. 8. Ost RCA lesion, 100% stenosed. 9. Dist Graft lesion, 95% stenosed. There is a 0% residual stenosis post intervention. 10. A drug-eluting stent was placed. 11. 1st Mrg lesion, 100% stenosed. 12. Mid LAD to Dist LAD lesion, 40% stenosed.   Final conclusion:  Severe native three-vessel coronary artery disease with total occlusion of the proximal LAD and total occlusion of the proximal RCA  Continued patency of the stented segment extending from the left mainstem into the left circumflex  Continued patency of the LIMA to LAD graft  Continued patency of the saphenous vein graft to right PDA, with severe stenosis in the distal body of the graft treated successfully with a drug-eluting stent platform using distal embolic protection  Recommend continue dual antiplatelets therapy indefinitely with aspirin and clopidogrel. As long as kidney function is stable, anticipate hospital discharge tomorrow. [name] losartan has been held and will be resumed if kidney function is stable.   Indications   Exertional angina (HCC) [I20.8 ([id number])]  Complications   Complications documented in old activity   INDICATION: CCS class III angina on maximal medical therapy. 71 year old gentleman with extensive CAD, 2 previous bypass surgeries, and progressive exertional angina.   PROCEDURAL DETAILS: The left wrist was prepped, draped, and anesthetized with 1% lidocaine. Using the modified Seldinger technique, a 5/6 French Slender sheath was introduced into the left radial artery. 3 mg of verapamil was administered through the sheath, weight-based  unfractionated heparin was administered intravenously. Standard Judkins catheters were used for selective coronary angiography and bypass graft angiography. Ventriculography is deferred because of chronic  kidney disease. A JR4 catheter was used to image the LIMA graft, but a LIMA catheter would probably fit better. For the RCA graft, a multipurpose catheter is used. Catheter exchanges were performed over an exchange length guidewire. There were no immediate procedural complications. See below for description of PCI procedure. A TR band was used for radial hemostasis at the completion of the procedure. The patient was transferred to the post catheterization recovery area for further monitoring.There were no immediate complications during the procedure.      Coronary Findings   Diagnostic  Dominance: Right  Left Main  LM lesion 100% stenosed  calcified .  Left Anterior Descending  Mid LAD to Dist LAD lesion 40% stenosed  There is a 40% lesion in the LAD just beyond the graft insertion site. This does not appear to be flow-limiting.  Ramus Intermedius  Collaterals  Ramus filled by collaterals from 2nd Sept.    Left Circumflex  Ost Cx to Mid Cx lesion 20% stenosed  diffuse . The lesion was previously treated with a drug-eluting stent greater than two years ago.  First Obtuse Marginal Branch  1st Mrg lesion 100% stenosed  1st Mrg lesion.  Right Coronary Artery  Ost RCA lesion 100% stenosed  Ost RCA lesion.  Free Graft to RPDA  SVG was injected is large.  Dist Graft lesion 95% stenosed  discrete .  Free Graft to 1st Mrg  SVG was not injected . Known to be occluded  Prox Graft lesion 100% stenosed  Prox Graft lesion.  Free LIMA Graft to Mid LAD  LIMA was injected is normal in caliber, and is anatomically normal.  Intervention   Dist Graft lesion (Free Graft to RPDA)  PCI  The pre-interventional distal flow is normal (TIMI 3). No pre-stent angioplasty was performed. A drug-eluting  stent was placed. No post-stent angioplasty was performed. Maximum pressure: 14 atm. The post-interventional distal flow is normal (TIMI 3). The intervention was successful. No complications occurred at this lesion. There is severe stenosis of the SVG to PDA in the distal body of the graft. Heparin is used for anticoagulation. The patient has been on long-term aspirin and Plavix. A therapeutic ACT was achieved. A 6 [place] multipurpose guide catheter was used. A cougar wire is used to cross the lesion without difficulty. A 5 mm spider devices used for distal embolic protection. The lesion is primarily stented with a 4.0 x 15 mm Xience DES to 14 atm. following PCI, there is 0% residual stenosis and TIMI-3 flow.  There is no residual stenosis post intervention.  Coronary Diagrams   Diagnostic Diagram       Post-Intervention Diagram       Implants     Permanent Stent  Stent Xience Alpine Rx 4.0x15 - [id number] - Implanted    Inventory item: Stent Xience Alpine Rx 4.0x15 Model/Cat number: [id number]  Manufacturer: [employer] Lot number: [id number]  Device identifier: [id number] Device identifier type: GS1     ASSESSMENT AND PLAN: 1.  CAD, native vessel and bypass graft disease, with progressive CCS Class 3 angina on maximal medical therapy (amlodipine, ranolazine, isosorbide, and metoprolol succinate). Reviewed last cath films from 2016 demonstrating patent LIMA-LAD, patent native LCx/OM, and severe stenosis in the SVG-RCA treated with a DES. 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 Discussed options - he is on maximal Rx. Concerned about his risk of SVG closure. Recommend cath possible PCI. I have reviewed the risks, indications, and alternatives to cardiac catheterization, possible angioplasty, and  stenting with the patient. Risks include but are not limited to bleeding, infection, vascular injury, stroke, myocardial infection, arrhythmia, kidney injury, radiation-related injury  in the case of prolonged fluoroscopy use, emergency cardiac surgery, and death. The patient understands the risks of serious complication is 1-2 in 7290 with diagnostic cardiac cath and 1-2% or less with angioplasty/stenting. He also understands the increased risk of AKI on chronic kidney disease. His most recent creatinine is 2.28 mg/dL at Dr [employer] office. Will ask him to hold losartan the day before and day of cath, and will bring him in early for fluid hydration. Will limit contrast load as much as possible.   2. HTN: controlled on multidrug Rx.   3. Hyperlipidemia: treated with a high-intensity statin drug.   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   5. CKD 3: see above  Current medicines are reviewed with the patient today.  The patient does not have concerns regarding medicines.  Labs/ tests ordered today include:   Orders Placed This Encounter  Procedures  . EKG 12-Lead    Disposition:   FU pending cath results  Signed, [name], MD  04/10/2017 3:15 PM    [facility] [address], [place], [place]  [address] Phone: [phone]; Fax: [phone]

## 2017-04-10 ENCOUNTER — Telehealth: Payer: Self-pay | Admitting: Cardiovascular Disease

## 2017-04-10 NOTE — Telephone Encounter (Signed)
Patient called to report Areds 2 (a multivitamin for eye health) needs to be added to his medication list.  Med list updated.

## 2017-04-10 NOTE — Telephone Encounter (Signed)
New message  Pt verbalized that he is calling for the rn  Pt want to go over his medications

## 2017-04-10 NOTE — Telephone Encounter (Signed)
Left message to call back  

## 2017-04-13 ENCOUNTER — Telehealth: Payer: Self-pay

## 2017-04-13 NOTE — Telephone Encounter (Signed)
Patient contacted pre-catheterization at [facility] scheduled for:  04/14/2017 @ 1200 Verified arrival time and place:  NT@ 1000 Confirmed AM meds to be taken pre-cath with sip of water: Take ASA/Plavix Hold losartan today and tomorrow Hold lasix tomorrow Confirmed patient has responsible person to drive home post procedure and observe patient for 24 hours:  yes Addl concerns:  All questions answered

## 2017-04-13 NOTE — Telephone Encounter (Signed)
Left generic message requesting call back d/t no DPR on file.  This nurse name and direct # left.

## 2017-04-14 ENCOUNTER — Encounter (HOSPITAL_COMMUNITY): Payer: Self-pay | Admitting: General Practice

## 2017-04-14 ENCOUNTER — Other Ambulatory Visit: Payer: Self-pay

## 2017-04-14 ENCOUNTER — Encounter (HOSPITAL_COMMUNITY)
Admission: RE | Disposition: A | Discharge status: Home / Self Care | Payer: Self-pay | Source: Ambulatory Visit | Attending: Cardiovascular Disease

## 2017-04-14 ENCOUNTER — Ambulatory Visit (HOSPITAL_COMMUNITY)
Admission: RE | Admit: 2017-04-14 | Discharge: 2017-04-15 | Disposition: A | Discharge status: Home / Self Care | Payer: Medicare HMO | Source: Ambulatory Visit | Attending: Cardiovascular Disease | Admitting: Cardiovascular Disease

## 2017-04-14 DIAGNOSIS — I25119 Atherosclerotic heart disease of native coronary artery with unspecified angina pectoris: Secondary | ICD-10-CM | POA: Diagnosis not present

## 2017-04-14 DIAGNOSIS — I739 Peripheral vascular disease, unspecified: Secondary | ICD-10-CM | POA: Diagnosis not present

## 2017-04-14 DIAGNOSIS — Z951 Presence of aortocoronary bypass graft: Secondary | ICD-10-CM | POA: Insufficient documentation

## 2017-04-14 DIAGNOSIS — I2582 Chronic total occlusion of coronary artery: Secondary | ICD-10-CM | POA: Diagnosis not present

## 2017-04-14 DIAGNOSIS — Z9889 Other specified postprocedural states: Secondary | ICD-10-CM | POA: Insufficient documentation

## 2017-04-14 DIAGNOSIS — Z7982 Long term (current) use of aspirin: Secondary | ICD-10-CM | POA: Insufficient documentation

## 2017-04-14 DIAGNOSIS — N183 Chronic kidney disease, stage 3 (moderate): Secondary | ICD-10-CM | POA: Diagnosis not present

## 2017-04-14 DIAGNOSIS — Y838 Other surgical procedures as the cause of abnormal reaction of the patient, or of later complication, without mention of misadventure at the time of the procedure: Secondary | ICD-10-CM | POA: Insufficient documentation

## 2017-04-14 DIAGNOSIS — I2510 Atherosclerotic heart disease of native coronary artery without angina pectoris: Secondary | ICD-10-CM | POA: Diagnosis present

## 2017-04-14 DIAGNOSIS — T82855A Stenosis of coronary artery stent, initial encounter: Secondary | ICD-10-CM | POA: Insufficient documentation

## 2017-04-14 DIAGNOSIS — I25718 Atherosclerosis of autologous vein coronary artery bypass graft(s) with other forms of angina pectoris: Secondary | ICD-10-CM | POA: Insufficient documentation

## 2017-04-14 DIAGNOSIS — Z7902 Long term (current) use of antithrombotics/antiplatelets: Secondary | ICD-10-CM | POA: Insufficient documentation

## 2017-04-14 DIAGNOSIS — E785 Hyperlipidemia, unspecified: Secondary | ICD-10-CM | POA: Insufficient documentation

## 2017-04-14 DIAGNOSIS — I252 Old myocardial infarction: Secondary | ICD-10-CM | POA: Diagnosis not present

## 2017-04-14 DIAGNOSIS — Z950 Presence of cardiac pacemaker: Secondary | ICD-10-CM | POA: Diagnosis present

## 2017-04-14 DIAGNOSIS — Z9861 Coronary angioplasty status: Secondary | ICD-10-CM

## 2017-04-14 DIAGNOSIS — I25118 Atherosclerotic heart disease of native coronary artery with other forms of angina pectoris: Secondary | ICD-10-CM

## 2017-04-14 DIAGNOSIS — I129 Hypertensive chronic kidney disease with stage 1 through stage 4 chronic kidney disease, or unspecified chronic kidney disease: Secondary | ICD-10-CM | POA: Diagnosis not present

## 2017-04-14 DIAGNOSIS — Z955 Presence of coronary angioplasty implant and graft: Secondary | ICD-10-CM

## 2017-04-14 DIAGNOSIS — Z79899 Other long term (current) drug therapy: Secondary | ICD-10-CM | POA: Diagnosis not present

## 2017-04-14 DIAGNOSIS — I10 Essential (primary) hypertension: Secondary | ICD-10-CM | POA: Diagnosis present

## 2017-04-14 DIAGNOSIS — I208 Other forms of angina pectoris: Secondary | ICD-10-CM | POA: Diagnosis present

## 2017-04-14 HISTORY — DX: Chronic kidney disease, stage 3 (moderate): N18.3

## 2017-04-14 HISTORY — PX: LEFT HEART CATH AND CORS/GRAFTS ANGIOGRAPHY: CATH118250

## 2017-04-14 HISTORY — DX: Unspecified osteoarthritis, unspecified site: M19.90

## 2017-04-14 HISTORY — DX: Reserved for concepts with insufficient information to code with codable children: IMO0002

## 2017-04-14 HISTORY — DX: Unspecified malignant neoplasm of skin, unspecified: C44.90

## 2017-04-14 HISTORY — DX: Systemic lupus erythematosus, unspecified: M32.9

## 2017-04-14 HISTORY — PX: CORONARY ANGIOPLASTY WITH STENT PLACEMENT: SHX49

## 2017-04-14 LAB — BASIC METABOLIC PANEL
Anion gap: 7 (ref 5–15)
BUN: 45 mg/dL — AB (ref 6–20)
CO2: 25 mmol/L (ref 22–32)
CREATININE: 2.11 mg/dL — AB (ref 0.61–1.24)
Calcium: 9 mg/dL (ref 8.9–10.3)
Chloride: 104 mmol/L (ref 101–111)
GFR, EST AFRICAN AMERICAN: 34 mL/min — AB (ref >60)
GFR, EST NON AFRICAN AMERICAN: 30 mL/min — AB (ref >60)
Glucose, Bld: 100 mg/dL — ABNORMAL HIGH (ref 65–99)
POTASSIUM: 4.6 mmol/L (ref 3.5–5.1)
SODIUM: 136 mmol/L (ref 135–145)

## 2017-04-14 LAB — CBC
HCT: 41.6 % (ref 39.0–52.0)
Hemoglobin: 13.8 g/dL (ref 13.0–17.0)
MCH: 34.3 pg — AB (ref 26.0–34.0)
MCHC: 33.2 g/dL (ref 30.0–36.0)
MCV: 103.5 fL — ABNORMAL HIGH (ref 78.0–100.0)
PLATELETS: 170 10*3/uL (ref 150–400)
RBC: 4.02 MIL/uL — ABNORMAL LOW (ref 4.22–5.81)
RDW: 12.8 % (ref 11.5–15.5)
WBC: 5.9 10*3/uL (ref 4.0–10.5)

## 2017-04-14 LAB — POCT ACTIVATED CLOTTING TIME
ACTIVATED CLOTTING TIME: 274 s
Activated Clotting Time: 307 seconds

## 2017-04-14 LAB — PROTIME-INR
INR: 1.02
PROTHROMBIN TIME: 13.3 s (ref 11.4–15.2)

## 2017-04-14 SURGERY — LEFT HEART CATH AND CORS/GRAFTS ANGIOGRAPHY
Anesthesia: LOCAL

## 2017-04-14 MED ORDER — MIDAZOLAM HCL 2 MG/2ML IJ SOLN
INTRAMUSCULAR | Status: AC
  Filled 2017-04-14: qty 2

## 2017-04-14 MED ORDER — LIDOCAINE HCL (PF) 1 % IJ SOLN
INTRAMUSCULAR | Status: DC | PRN
  Administered 2017-04-14: 2 mL via INTRADERMAL

## 2017-04-14 MED ORDER — SODIUM CHLORIDE 0.9 % IV SOLN: 250.0000 mL | INTRAVENOUS | Status: DC | PRN

## 2017-04-14 MED ORDER — ROSUVASTATIN CALCIUM 10 MG PO TABS
10.0000 mg | ORAL_TABLET | Freq: Every evening | ORAL | Status: DC
  Administered 2017-04-14: 18:00:00 10 mg via ORAL
  Filled 2017-04-14: qty 1

## 2017-04-14 MED ORDER — COLCHICINE 0.6 MG PO TABS: 0.6000 mg | ORAL_TABLET | Freq: Every day | ORAL | Status: DC | PRN

## 2017-04-14 MED ORDER — SODIUM CHLORIDE 0.9% FLUSH: 3.0000 mL | Freq: Two times a day (BID) | INTRAVENOUS | Status: DC

## 2017-04-14 MED ORDER — SODIUM CHLORIDE 0.9 % WEIGHT BASED INFUSION: 1.0000 mL/kg/h | INTRAVENOUS | Status: DC

## 2017-04-14 MED ORDER — VERAPAMIL HCL 2.5 MG/ML IV SOLN
INTRAVENOUS | Status: DC | PRN
  Administered 2017-04-14 (×3): 200 ug via INTRACORONARY
  Administered 2017-04-14: 300 ug via INTRACORONARY

## 2017-04-14 MED ORDER — SODIUM CHLORIDE 0.9% FLUSH: 3.0000 mL | INTRAVENOUS | Status: DC | PRN

## 2017-04-14 MED ORDER — ACETAMINOPHEN 325 MG PO TABS
650.0000 mg | ORAL_TABLET | ORAL | Status: DC | PRN
  Administered 2017-04-14: 650 mg via ORAL
  Filled 2017-04-14: qty 2

## 2017-04-14 MED ORDER — ONDANSETRON HCL 4 MG/2ML IJ SOLN: 4.0000 mg | Freq: Four times a day (QID) | INTRAMUSCULAR | Status: DC | PRN

## 2017-04-14 MED ORDER — ASPIRIN EC 81 MG PO TBEC
81.0000 mg | DELAYED_RELEASE_TABLET | Freq: Every day | ORAL | Status: DC
  Administered 2017-04-15: 10:00:00 81 mg via ORAL
  Filled 2017-04-14: qty 1

## 2017-04-14 MED ORDER — HYDRALAZINE HCL 20 MG/ML IJ SOLN
5.0000 mg | INTRAMUSCULAR | Status: AC | PRN
  Administered 2017-04-14: 18:00:00 5 mg via INTRAVENOUS
  Filled 2017-04-14 (×2): qty 1

## 2017-04-14 MED ORDER — LIDOCAINE HCL (PF) 1 % IJ SOLN
INTRAMUSCULAR | Status: AC
  Filled 2017-04-14: qty 30

## 2017-04-14 MED ORDER — SODIUM CHLORIDE 0.9% FLUSH
3.0000 mL | Freq: Two times a day (BID) | INTRAVENOUS | Status: DC
  Administered 2017-04-14: 18:00:00 3 mL via INTRAVENOUS

## 2017-04-14 MED ORDER — LABETALOL HCL 5 MG/ML IV SOLN: 10.0000 mg | INTRAVENOUS | Status: DC | PRN

## 2017-04-14 MED ORDER — SODIUM CHLORIDE 0.9% FLUSH
3.0000 mL | INTRAVENOUS | Status: DC | PRN
Start: 2017-04-14 — End: 2017-04-14

## 2017-04-14 MED ORDER — HYDRALAZINE HCL 20 MG/ML IJ SOLN: 5.0000 mg | INTRAMUSCULAR | Status: DC | PRN

## 2017-04-14 MED ORDER — HEPARIN (PORCINE) IN NACL 2-0.9 UNIT/ML-% IJ SOLN
INTRAMUSCULAR | Status: AC
  Filled 2017-04-14: qty 1000

## 2017-04-14 MED ORDER — FENTANYL CITRATE (PF) 100 MCG/2ML IJ SOLN
INTRAMUSCULAR | Status: AC
  Filled 2017-04-14: qty 2

## 2017-04-14 MED ORDER — MECLIZINE HCL 25 MG PO TABS: 25.0000 mg | ORAL_TABLET | Freq: Three times a day (TID) | ORAL | Status: DC | PRN

## 2017-04-14 MED ORDER — HEPARIN (PORCINE) IN NACL 2-0.9 UNIT/ML-% IJ SOLN
INTRAMUSCULAR | Status: AC | PRN
  Administered 2017-04-14: 1000 mL via INTRA_ARTERIAL

## 2017-04-14 MED ORDER — TAMSULOSIN HCL 0.4 MG PO CAPS
0.4000 mg | ORAL_CAPSULE | Freq: Every day | ORAL | Status: DC
  Administered 2017-04-14: 0.4 mg via ORAL
  Filled 2017-04-14: qty 1

## 2017-04-14 MED ORDER — ANGIOPLASTY BOOK
Freq: Once | Status: AC
  Administered 2017-04-14: 21:00:00 1
  Filled 2017-04-14: qty 1

## 2017-04-14 MED ORDER — VERAPAMIL HCL 2.5 MG/ML IV SOLN
INTRAVENOUS | Status: DC | PRN
  Administered 2017-04-14: 10 mL via INTRA_ARTERIAL

## 2017-04-14 MED ORDER — AMLODIPINE BESYLATE 5 MG PO TABS
5.0000 mg | ORAL_TABLET | Freq: Every day | ORAL | Status: DC
  Administered 2017-04-14 – 2017-04-15 (×2): 5 mg via ORAL
  Filled 2017-04-14 (×2): qty 1

## 2017-04-14 MED ORDER — CLOPIDOGREL BISULFATE 75 MG PO TABS: 75.0000 mg | ORAL_TABLET | ORAL | Status: DC

## 2017-04-14 MED ORDER — MIDAZOLAM HCL 2 MG/2ML IJ SOLN
INTRAMUSCULAR | Status: DC | PRN
  Administered 2017-04-14 (×2): 1 mg via INTRAVENOUS

## 2017-04-14 MED ORDER — IOPAMIDOL (ISOVUE-370) INJECTION 76%
INTRAVENOUS | Status: DC | PRN
  Administered 2017-04-14: 75 mL via INTRA_ARTERIAL

## 2017-04-14 MED ORDER — HEPARIN SODIUM (PORCINE) 1000 UNIT/ML IJ SOLN
INTRAMUSCULAR | Status: AC
  Filled 2017-04-14: qty 1

## 2017-04-14 MED ORDER — IOPAMIDOL (ISOVUE-370) INJECTION 76%
INTRAVENOUS | Status: AC
  Filled 2017-04-14: qty 100

## 2017-04-14 MED ORDER — SODIUM CHLORIDE 0.9 % IV SOLN
INTRAVENOUS | Status: AC
  Administered 2017-04-14: 15:00:00 via INTRAVENOUS

## 2017-04-14 MED ORDER — HEPARIN SODIUM (PORCINE) 1000 UNIT/ML IJ SOLN
INTRAMUSCULAR | Status: DC | PRN
  Administered 2017-04-14: 7000 [IU] via INTRAVENOUS
  Administered 2017-04-14: 3000 [IU] via INTRAVENOUS

## 2017-04-14 MED ORDER — RANOLAZINE ER 500 MG PO TB12
1000.0000 mg | ORAL_TABLET | Freq: Two times a day (BID) | ORAL | Status: DC
  Administered 2017-04-14 – 2017-04-15 (×2): 1000 mg via ORAL
  Filled 2017-04-14 (×2): qty 2

## 2017-04-14 MED ORDER — FENTANYL CITRATE (PF) 100 MCG/2ML IJ SOLN
INTRAMUSCULAR | Status: DC | PRN
  Administered 2017-04-14 (×2): 25 ug via INTRAVENOUS

## 2017-04-14 MED ORDER — ASPIRIN 81 MG PO CHEW
81.0000 mg | CHEWABLE_TABLET | ORAL | Status: DC
Start: 2017-04-14 — End: 2017-04-14

## 2017-04-14 MED ORDER — SODIUM CHLORIDE 0.9 % WEIGHT BASED INFUSION
3.0000 mL/kg/h | INTRAVENOUS | Status: DC
  Administered 2017-04-14: 3 mL/kg/h via INTRAVENOUS

## 2017-04-14 MED ORDER — CLOPIDOGREL BISULFATE 75 MG PO TABS
75.0000 mg | ORAL_TABLET | Freq: Every day | ORAL | Status: DC
  Administered 2017-04-15: 10:00:00 75 mg via ORAL
  Filled 2017-04-14: qty 1

## 2017-04-14 MED ORDER — VERAPAMIL HCL 2.5 MG/ML IV SOLN
INTRAVENOUS | Status: AC
  Filled 2017-04-14: qty 2

## 2017-04-14 MED ORDER — ISOSORBIDE MONONITRATE ER 60 MG PO TB24
60.0000 mg | ORAL_TABLET | Freq: Every day | ORAL | Status: DC
  Administered 2017-04-15: 10:00:00 60 mg via ORAL
  Filled 2017-04-14: qty 1

## 2017-04-14 MED ORDER — METOPROLOL SUCCINATE ER 50 MG PO TB24
50.0000 mg | ORAL_TABLET | Freq: Two times a day (BID) | ORAL | Status: DC
  Administered 2017-04-14 – 2017-04-15 (×2): 50 mg via ORAL
  Filled 2017-04-14 (×2): qty 1

## 2017-04-14 MED ORDER — ALLOPURINOL 100 MG PO TABS
100.0000 mg | ORAL_TABLET | Freq: Every day | ORAL | Status: DC
  Administered 2017-04-15: 10:00:00 100 mg via ORAL
  Filled 2017-04-14: qty 1

## 2017-04-14 SURGICAL SUPPLY — 23 items
BALLN SAPPHIRE 2.5X15 (BALLOONS) ×2
BALLN WOLVERINE 4.00X10 (BALLOONS) ×2
BALLN ~~LOC~~ EUPHORA RX 4.5X15 (BALLOONS) ×2
BALLOON SAPPHIRE 2.5X15 (BALLOONS) ×1 IMPLANT
BALLOON WOLVERINE 4.00X10 (BALLOONS) ×1 IMPLANT
BALLOON ~~LOC~~ EUPHORA RX 4.5X15 (BALLOONS) ×1 IMPLANT
CATH EXPO 5F MPA-1 (CATHETERS) ×2 IMPLANT
CATH INFINITI 5FR JL4 (CATHETERS) ×2 IMPLANT
CATH LAUNCHER 5F IMA (CATHETERS) ×1 IMPLANT
CATHETER LAUNCHER 5F IMA (CATHETERS) ×2
CATHETER LAUNCHER 6FR MP1 (CATHETERS) ×2 IMPLANT
DEVICE RAD COMP TR BAND LRG (VASCULAR PRODUCTS) ×2 IMPLANT
GLIDESHEATH SLEND SS 6F .021 (SHEATH) ×2 IMPLANT
GUIDEWIRE INQWIRE 1.5J.035X260 (WIRE) ×1 IMPLANT
INQWIRE 1.5J .035X260CM (WIRE) ×2
KIT ENCORE 26 ADVANTAGE (KITS) ×2 IMPLANT
KIT HEART LEFT (KITS) ×2 IMPLANT
PACK CARDIAC CATHETERIZATION (CUSTOM PROCEDURE TRAY) ×2 IMPLANT
STENT PROMUS PREM MR 4.0X16 (Permanent Stent) ×2 IMPLANT
STENT PROMUS PREM MR 4.0X24 (Permanent Stent) ×2 IMPLANT
TRANSDUCER W/STOPCOCK (MISCELLANEOUS) ×2 IMPLANT
TUBING CIL FLEX 10 FLL-RA (TUBING) ×2 IMPLANT
WIRE COUGAR XT STRL 190CM (WIRE) ×2 IMPLANT

## 2017-04-14 NOTE — Interval H&P Note (Signed)
Cath Lab Visit (complete for each Cath Lab visit)  Clinical Evaluation Leading to the Procedure:   ACS: No.  Non-ACS:    Anginal Classification: CCS III  Anti-ischemic medical therapy: Maximal Therapy (2 or more classes of medications)  Non-Invasive Test Results: No non-invasive testing performed  Prior CABG: No previous CABG      History and Physical Interval Note:  04/14/2017 1:00 PM  Kyle Avila  has presented today for surgery, with the diagnosis of excertional angina  The various methods of treatment have been discussed with the patient and family. After consideration of risks, benefits and other options for treatment, the patient has consented to  Procedure(s): LEFT HEART CATH AND CORS/GRAFTS ANGIOGRAPHY (N/A) as a surgical intervention .  [name] history has been reviewed, patient examined, no change in status, stable for surgery.  I have reviewed the patient's chart and labs.  Questions were answered to the patient's satisfaction.     [name]

## 2017-04-15 ENCOUNTER — Encounter (HOSPITAL_COMMUNITY): Payer: Self-pay | Admitting: Cardiovascular Disease

## 2017-04-15 DIAGNOSIS — I2510 Atherosclerotic heart disease of native coronary artery without angina pectoris: Secondary | ICD-10-CM | POA: Diagnosis not present

## 2017-04-15 DIAGNOSIS — Z951 Presence of aortocoronary bypass graft: Secondary | ICD-10-CM | POA: Diagnosis not present

## 2017-04-15 DIAGNOSIS — Z9861 Coronary angioplasty status: Secondary | ICD-10-CM

## 2017-04-15 DIAGNOSIS — I25718 Atherosclerosis of autologous vein coronary artery bypass graft(s) with other forms of angina pectoris: Secondary | ICD-10-CM | POA: Diagnosis not present

## 2017-04-15 DIAGNOSIS — Z9889 Other specified postprocedural states: Secondary | ICD-10-CM | POA: Diagnosis not present

## 2017-04-15 DIAGNOSIS — T82855A Stenosis of coronary artery stent, initial encounter: Secondary | ICD-10-CM | POA: Diagnosis not present

## 2017-04-15 DIAGNOSIS — I208 Other forms of angina pectoris: Secondary | ICD-10-CM

## 2017-04-15 DIAGNOSIS — Z950 Presence of cardiac pacemaker: Secondary | ICD-10-CM | POA: Diagnosis not present

## 2017-04-15 DIAGNOSIS — I25119 Atherosclerotic heart disease of native coronary artery with unspecified angina pectoris: Secondary | ICD-10-CM | POA: Diagnosis not present

## 2017-04-15 DIAGNOSIS — Z7902 Long term (current) use of antithrombotics/antiplatelets: Secondary | ICD-10-CM | POA: Diagnosis not present

## 2017-04-15 DIAGNOSIS — I2582 Chronic total occlusion of coronary artery: Secondary | ICD-10-CM | POA: Diagnosis not present

## 2017-04-15 DIAGNOSIS — I129 Hypertensive chronic kidney disease with stage 1 through stage 4 chronic kidney disease, or unspecified chronic kidney disease: Secondary | ICD-10-CM | POA: Diagnosis not present

## 2017-04-15 LAB — CBC
HEMATOCRIT: 41 % (ref 39.0–52.0)
HEMOGLOBIN: 13.5 g/dL (ref 13.0–17.0)
MCH: 34 pg (ref 26.0–34.0)
MCHC: 32.9 g/dL (ref 30.0–36.0)
MCV: 103.3 fL — ABNORMAL HIGH (ref 78.0–100.0)
Platelets: 166 10*3/uL (ref 150–400)
RBC: 3.97 MIL/uL — ABNORMAL LOW (ref 4.22–5.81)
RDW: 12.9 % (ref 11.5–15.5)
WBC: 6.1 10*3/uL (ref 4.0–10.5)

## 2017-04-15 LAB — BASIC METABOLIC PANEL
ANION GAP: 9 (ref 5–15)
BUN: 36 mg/dL — ABNORMAL HIGH (ref 6–20)
CO2: 22 mmol/L (ref 22–32)
Calcium: 8.7 mg/dL — ABNORMAL LOW (ref 8.9–10.3)
Chloride: 106 mmol/L (ref 101–111)
Creatinine, Ser: 1.88 mg/dL — ABNORMAL HIGH (ref 0.61–1.24)
GFR calc Af Amer: 39 mL/min — ABNORMAL LOW (ref >60)
GFR, EST NON AFRICAN AMERICAN: 34 mL/min — AB (ref >60)
GLUCOSE: 95 mg/dL (ref 65–99)
POTASSIUM: 5.1 mmol/L (ref 3.5–5.1)
Sodium: 137 mmol/L (ref 135–145)

## 2017-04-15 MED ORDER — LOSARTAN POTASSIUM 100 MG PO TABS: 100.0000 mg | ORAL_TABLET | Freq: Two times a day (BID) | ORAL | 3 refills | Status: DC

## 2017-04-15 MED ORDER — FUROSEMIDE 40 MG PO TABS: 40.0000 mg | ORAL_TABLET | Freq: Every day | ORAL | Status: DC

## 2017-04-15 MED ORDER — ACETAMINOPHEN 325 MG PO TABS: 650.0000 mg | ORAL_TABLET | ORAL | Status: DC | PRN

## 2017-04-15 NOTE — Discharge Instructions (Signed)
Radial Site Care Refer to this sheet in the next few weeks. These instructions provide you with information about caring for yourself after your procedure. Your health care provider may also give you more specific instructions. Your treatment has been planned according to current medical practices, but problems sometimes occur. Call your health care provider if you have any problems or questions after your procedure. What can I expect after the procedure? After your procedure, it is typical to have the following:  Bruising at the radial site that usually fades within 1-2 weeks.  Blood collecting in the tissue (hematoma) that may be painful to the touch. It should usually decrease in size and tenderness within 1-2 weeks.  Follow these instructions at home:  Take medicines only as directed by your health care provider.  You may shower 24-48 hours after the procedure or as directed by your health care provider. Remove the bandage (dressing) and gently wash the site with plain soap and water. Pat the area dry with a clean towel. Do not rub the site, because this may cause bleeding.  Do not take baths, swim, or use a hot tub until your health care provider approves.  Check your insertion site every day for redness, swelling, or drainage.  Do not apply powder or lotion to the site.  Do not flex or bend the affected arm for 24 hours or as directed by your health care provider.  Do not push or pull heavy objects with the affected arm for 24 hours or as directed by your health care provider.  Do not lift over 10 lb (4.5 kg) for 5 days after your procedure or as directed by your health care provider.  Ask your health care provider when it is okay to: ? Return to work or school. ? Resume usual physical activities or sports. ? Resume sexual activity.  Do not drive home if you are discharged the same day as the procedure. Have someone else drive you.  You may drive 24 hours after the procedure  unless otherwise instructed by your health care provider.  Do not operate machinery or power tools for 24 hours after the procedure.  If your procedure was done as an outpatient procedure, which means that you went home the same day as your procedure, a responsible adult should be with you for the first 24 hours after you arrive home.  Keep all follow-up visits as directed by your health care provider. This is important. Contact a health care provider if:  You have a fever.  You have chills.  You have increased bleeding from the radial site. Hold pressure on the site. Get help right away if:  You have unusual pain at the radial site.  You have redness, warmth, or swelling at the radial site.  You have drainage (other than a small amount of blood on the dressing) from the radial site.  The radial site is bleeding, and the bleeding does not stop after 30 minutes of holding steady pressure on the site.  Your arm or hand becomes pale, cool, tingly, or numb. This information is not intended to replace advice given to you by your health care provider. Make sure you discuss any questions you have with your health care provider. Document Released: 05/17/2010 Document Revised: 09/20/2015 Document Reviewed: 10/31/2013 Elsevier Interactive Patient Education  2018 [employer]. Coronary Angiogram With Stent, Care After This sheet gives you information about how to care for yourself after your procedure. Your health care provider may also  give you more specific instructions. If you have problems or questions, contact your health care provider. What can I expect after the procedure? After your procedure, it is common to have:  Bruising in the area where a small, thin tube (catheter) was inserted. This usually fades within 1-2 weeks.  Blood collecting in the tissue (hematoma) that may be painful to the touch. It should usually decrease in size and tenderness within 1-2 weeks.  Follow these  instructions at home: Insertion area care  Do not take baths, swim, or use a hot tub until your health care provider approves.  You may shower 24-48 hours after the procedure or as directed by your health care provider.  Follow instructions from your health care provider about how to take care of your incision. Make sure you: ? Wash your hands with soap and water before you change your bandage (dressing). If soap and water are not available, use hand sanitizer. ? Change your dressing as told by your health care provider. ? Leave stitches (sutures), skin glue, or adhesive strips in place. These skin closures may need to stay in place for 2 weeks or longer. If adhesive strip edges start to loosen and curl up, you may trim the loose edges. Do not remove adhesive strips completely unless your health care provider tells you to do that.  Remove the bandage (dressing) and gently wash the catheter insertion site with plain soap and water.  Pat the area dry with a clean towel. Do not rub the area, because that may cause bleeding.  Do not apply powder or lotion to the incision area.  Check your incision area every day for signs of infection. Check for: ? More redness, swelling, or pain. ? More fluid or blood. ? Warmth. ? Pus or a bad smell. Activity  Do not drive for 24 hours if you were given a medicine to help you relax (sedative).  Do not lift anything that is heavier than 10 lb (4.5 kg) for 5 days after your procedure or as directed by your health care provider.  Ask your health care provider when it is okay for you: ? To return to work or school. ? To resume usual physical activities or sports. ? To resume sexual activity. Eating and drinking  Eat a heart-healthy diet. This should include plenty of fresh fruits and vegetables.  Avoid the following types of food: ? Food that is high in salt. ? Canned or highly processed food. ? Food that is high in saturated fat or sugar. ? [employer].  Limit alcohol intake to no more than 1 drink a day for non-pregnant women and 2 drinks a day for men. One drink equals 12 oz of beer, 5 oz of wine, or 1 oz of hard liquor. Lifestyle  Do not use any products that contain nicotine or tobacco, such as cigarettes and e-cigarettes. If you need help quitting, ask your health care provider.  Take steps to manage and control your weight.  Get regular exercise.  Manage your blood pressure.  Manage other health problems, such as diabetes. General instructions  Take over-the-counter and prescription medicines only as told by your health care provider. Blood thinners may be prescribed after your procedure to improve blood flow through the stent.  If you need an MRI after your heart stent has been placed, be sure to tell the health care provider who orders the MRI that you have a heart stent.  Keep all follow-up visits as directed by  your health care provider. This is important. Contact a health care provider if:  You have a fever.  You have chills.  You have increased bleeding from the catheter insertion area. Hold pressure on the area. Get help right away if:  You develop chest pain or shortness of breath.  You feel faint or you pass out.  You have unusual pain at the catheter insertion area.  You have redness, warmth, or swelling at the catheter insertion area.  You have drainage (other than a small amount of blood on the dressing) from the catheter insertion area.  The catheter insertion area is bleeding, and the bleeding does not stop after 30 minutes of holding steady pressure on the area.  You develop bleeding from any other place, such as from your rectum. There may be bright red blood in your urine or stool, or it may appear as black, tarry stool. This information is not intended to replace advice given to you by your health care provider. Make sure you discuss any questions you have with your health care  provider. Document Released: 11/01/2004 Document Revised: 01/10/2016 Document Reviewed: 01/10/2016 Elsevier Interactive Patient Education  [employer].

## 2017-04-15 NOTE — Progress Notes (Signed)
Progress Note  Patient Name: Kyle Avila Date of Encounter: 04/15/2017  Primary Cardiologist: Dr Kyle Avila  Subjective   No chest pain overnight  Inpatient Medications    Scheduled Meds: . allopurinol  100 mg Oral Daily  . amLODipine  5 mg Oral Daily  . aspirin EC  81 mg Oral Daily  . clopidogrel  75 mg Oral Daily  . isosorbide mononitrate  60 mg Oral Daily  . metoprolol succinate  50 mg Oral BID  . ranolazine  1,000 mg Oral BID  . rosuvastatin  10 mg Oral QPM  . sodium chloride flush  3 mL Intravenous Q12H  . tamsulosin  0.4 mg Oral QPC supper   Continuous Infusions: . sodium chloride     PRN Meds: sodium chloride, acetaminophen, hydrALAZINE, ondansetron (ZOFRAN) IV, sodium chloride flush   Vital Signs    Vitals:   04/15/17 0100 04/15/17 0200 04/15/17 0400 04/15/17 0750  BP: (!) 152/56 (!) 158/62 (!) 139/51 (!) 151/56  Pulse: 64 63 60 68  Resp: (!) 22 19 14  (!) 26  Temp: 98.8 F (37.1 C)  98.5 F (36.9 C) 98.6 F (37 C)  TempSrc: Oral  Oral Oral  SpO2: 91% 95% 96% 99%  Weight:      Height:        Intake/Output Summary (Last 24 hours) at 04/15/2017 1100 Last data filed at 04/15/2017 0753 Gross per 24 hour  Intake 1540 ml  Output 1350 ml  Net 190 ml   Filed Weights   04/14/17 0905  Weight: 170 lb (77.1 kg)    Telemetry    NSR - Personally Reviewed  ECG    Paced- Personally Reviewed  Physical Exam   GEN: No acute distress.   Neck: No JVD Cardiac: RRR, 2/6 systolic murmur AOV, rubs, or gallops.  Respiratory: Clear to auscultation bilaterally. GI: Soft, nontender, non-distended  MS: No edema; No deformity.Lt radial site without hematoma Neuro:  Nonfocal  Psych: Normal affect   Labs    Chemistry Recent Labs  Lab 04/14/17 0916 04/15/17 0439  NA 136 137  K 4.6 5.1  CL 104 106  CO2 25 22  GLUCOSE 100* 95  BUN 45* 36*  CREATININE 2.11* 1.88*  CALCIUM 9.0 8.7*  GFRNONAA 30* 34*  GFRAA 34* 39*  ANIONGAP 7 9      Hematology Recent Labs  Lab 04/14/17 0916 04/15/17 0439  WBC 5.9 6.1  RBC 4.02* 3.97*  HGB 13.8 13.5  HCT 41.6 41.0  MCV 103.5* 103.3*  MCH 34.3* 34.0  MCHC 33.2 32.9  RDW 12.8 12.9  PLT 170 166    Cardiac EnzymesNo results for input(s): TROPONINI in the last 168 hours. No results for input(s): TROPIPOC in the last 168 hours.   BNPNo results for input(s): BNP, PROBNP in the last 168 hours.   DDimer No results for input(s): DDIMER in the last 168 hours.   Radiology    No results found.  Cardiac Studies   See Cath/PCI report 04/14/17  Patient Profile     71 y.o. male with a history of prior CABG (twice) and prior PCI's, admitted for elective cath secondary to exertional dyspnea c/w angina.  Assessment & Plan    1.  Exertional angina (DOE) with known CAD. 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  2. HTN: controlled on multidrug Rx.   3. Hyperlipidemia: treated with a high-intensity statin drug.   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  5. CKD 3: hold Lasix and ARB today.  Plan: Discharge home. F/U with Dr Kyle Avila APP in 1-2 weeks with BMP.   For questions or updates, please contact [place] Please consult [email or web address] for contact info under Cardiology/STEMI.      Signed, Kyle Ransom, PA-C  04/15/2017, 11:00 AM    Patient seen and examined and history reviewed. Agree with above findings and plan. Feels very well this am. No chest pain or SOB. VSS. Labs stable. No radial site hematoma. Lungs clear. CV without gallop. Renal indices stable. OK for discharge today.  Kyle Avila, [place] 04/15/2017 11:07 AM

## 2017-04-15 NOTE — Discharge Summary (Signed)
Discharge Summary    Patient ID: Kyle Avila,  MRN: [id number], DOB/AGE: 01/08/1946 71 y.o.  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 male with a history of prior CABG (twice) and prior PCI's, admitted for elective cath secondary to exertional dyspnea c/w angina.   Hospital Course      71 y/o male with a long history of CAD, s/p CABG in '88 with re do in '96, PCI in '09, PTVDP gen change in May 2016, and PCI July 2016. He [place] in the airport [place] he [place] from a skiing trip. Dr Burt Knack admitted him for cath. The pt has CRI-3 and his Lasix and Cozaar were held until 04/16/17. Her underwent cath and PCI with DES to the SVG-PDA which had ISR. His SCr at discharge is 1.88. He is discharged 04/15/17. He did spike a Temp overnight to 100. He denies any dyspnea, cough, or chills. He is afebrile and anxious for discharge 04/15/17.  _____________  Discharge Vitals Blood pressure (!) 151/56, pulse 68, temperature 98.6 F (37 C), temperature source Oral, resp. rate (!) 26, height 5\' 8"  (1.727 m), weight 170 lb (77.1 kg), SpO2 99 %.  Filed Weights   04/14/17 0905  Weight: 170 lb (77.1 kg)    Labs & Radiologic Studies    CBC Recent Labs    04/14/17 0916 04/15/17 0439  WBC 5.9 6.1  HGB 13.8 13.5  HCT 41.6 41.0  MCV 103.5* 103.3*  PLT 170 742   Basic Metabolic Panel Recent Labs    04/14/17 0916 04/15/17 0439  NA 136 137  K 4.6 5.1  CL 104 106  CO2 25 22  GLUCOSE 100* 95  BUN  45* 36*  CREATININE 2.11* 1.88*  CALCIUM 9.0 8.7*   Liver Function Tests No results for input(s): AST, ALT, ALKPHOS, BILITOT, PROT, ALBUMIN in the last 72 hours. No results for input(s): LIPASE, AMYLASE in the last 72 hours. Cardiac Enzymes No results for input(s): CKTOTAL, CKMB, CKMBINDEX, TROPONINI in the last 72 hours. BNP Invalid input(s): POCBNP D-Dimer No results for input(s): DDIMER in the last 72 hours. Hemoglobin A1C No results for input(s): HGBA1C in the last 72 hours. Fasting Lipid Panel No results for input(s): CHOL, HDL, LDLCALC, TRIG, CHOLHDL, LDLDIRECT in the last 72 hours. Thyroid Function Tests No results for input(s): TSH, T4TOTAL, T3FREE, THYROIDAB in the last 72 hours.  Invalid input(s): FREET3 _____________  No results found. Disposition   Pt is being discharged home today in good condition.  Follow-up Plans & Appointments    Follow-up Information    [name], MD Follow up.   Specialty:  Cardiology Why:  Office will contact you Contact information: [address] [address] [phone]          Discharge Instructions    Amb Referral to Cardiac Rehabilitation   Complete by:  As directed    Diagnosis:  Coronary  Stents      Discharge Medications   Allergies as of 04/15/2017   No Known Allergies     Medication List    TAKE these medications   acetaminophen 325 MG tablet Commonly known as:  TYLENOL Take 2 tablets (650 mg total) by mouth every 4 (four) hours as needed for headache or mild pain.   allopurinol 100 MG tablet Commonly known as:  ZYLOPRIM Take 100 mg by mouth daily.   amLODipine 10 MG tablet Commonly known as:  NORVASC Take 5 mg by mouth daily.   aspirin 81 MG EC tablet Take 81 mg by mouth daily.   clopidogrel 75 MG tablet Commonly known as:  PLAVIX Take 1 tablet (75 mg total) by mouth daily. .   colchicine 0.6 MG tablet Take 0.6 mg by mouth daily as needed (gout flare).     furosemide 40 MG tablet Commonly known as:  LASIX Take 1 tablet (40 mg total) by mouth daily. Start taking on:  04/16/2017   GUAIFENESIN PO Take 400 mg by mouth daily.   isosorbide mononitrate 60 MG 24 hr tablet Commonly known as:  IMDUR Take 60 mg by mouth daily.   ketoconazole 2 % cream Commonly known as:  NIZORAL Apply 1 application topically 2 (two) times daily as needed for irritation (itching).   losartan 100 MG tablet Commonly known as:  COZAAR Take 1 tablet (100 mg total) by mouth 2 (two) times daily. Start taking on:  04/16/2017   meclizine 25 MG tablet Commonly known as:  ANTIVERT Take 25 mg by mouth 3 (three) times daily as needed for dizziness.   metoprolol succinate 50 MG 24 hr tablet Commonly known as:  TOPROL-XL Take 50 mg by mouth 2 (two) times daily. Verified that it is succinate twice daily   nitroGLYCERIN 0.4 MG SL tablet Commonly known as:  NITROSTAT PLACE ONE TABLET UNDER THE TONGUE EVERY FIVE MINUTES AS NEEDED FOR CHEST PAIN What changed:    how much to take  how to take this  when to take this  reasons to take this  additional instructions   PRESERVISION AREDS 2 PO Take by mouth 2 times daily at 12 noon and 4 pm.   ranolazine 1000 MG SR tablet Commonly known as:  RANEXA Take 1 tablet (1,000 mg total) by mouth 2 (two) times daily.   rosuvastatin 10 MG tablet Commonly known as:  CRESTOR Take 1 tablet (10 mg total) by mouth daily. What changed:  when to take this   tamsulosin 0.4 MG Caps capsule Commonly known as:  FLOMAX 0.4 mg daily. Take 0.4 mg by mouth once daily        Aspirin prescribed at discharge?  Yes High Intensity Statin Prescribed? (Lipitor 40-80mg  or Crestor 20-40mg ): Yes Beta Blocker Prescribed? Yes For EF <40%, was ACEI/ARB Prescribed? Yes ADP Receptor Inhibitor Prescribed? (i.e. Plavix etc.-Includes Medically Managed Patients): Yes For EF <40%, Aldosterone Inhibitor Prescribed? No: CRI Was EF assessed during  THIS hospitalization? No: CRI-no LVG. Echo done in Feb 2018 Was Cardiac Rehab II ordered? (Included Medically managed Patients): Yes   Outstanding Labs/Studies     Duration of Discharge Encounter   Greater than 30 minutes including physician time.  Angelena Form PA 04/15/2017, 11:08 AM

## 2017-04-15 NOTE — Progress Notes (Signed)
[id number] Pt has been up walking independently in hallway with steady gait and no CP so I did not walk with him. Education completed with pt who voiced understanding. Stressed importance of plavix with stent. Reviewed NTG use, ex ed and heart healthy food choices. Referring to CRP 2 [place]. Pt attended years ago. Graylon Good RN BSN 04/15/2017 9:11 AM

## 2017-04-16 ENCOUNTER — Telehealth (HOSPITAL_COMMUNITY): Payer: Self-pay

## 2017-04-16 NOTE — Telephone Encounter (Signed)
Patients insurance is active and benefits verified through [facility] - $10.00 co-pay, no deductible, out of pocket amount of $5,900/$460.00 has been met, no co-insurance, and no pre-authorization is required. Passport/reference [phone]  Patient will be contacted and scheduled after review by the RN Navigator.

## 2017-04-17 DIAGNOSIS — I129 Hypertensive chronic kidney disease with stage 1 through stage 4 chronic kidney disease, or unspecified chronic kidney disease: Secondary | ICD-10-CM | POA: Diagnosis not present

## 2017-04-22 ENCOUNTER — Telehealth: Payer: Self-pay | Admitting: Cardiovascular Disease

## 2017-04-22 NOTE — Telephone Encounter (Signed)
New Message  Pt call requesting to speak with RN to discuss what his heart rate should be when exercising. Please call back to discuss

## 2017-04-22 NOTE — Telephone Encounter (Signed)
Patient called to ask Dr. Burt Knack what his target HR should be during exercise. He states he has been going to the gym and has been walking on the treadmill. Currently, he allows his HR to increase to between 90-100. He has had no symptoms (CP/SOB) since cath 12/18. Reiterated to him that while exercising, if he does experience symptoms that do not resolve with rest to call the office. Instructed him not to increase HR any further until Dr. Burt Knack is consulted.  He understands he will be called with Dr. Antionette Char recommendations for target HR.

## 2017-04-23 NOTE — Telephone Encounter (Signed)
Reviewed Dr. Antionette Char instructions for patient. He understands to keep HR under 110, and he may increase to but not exceed 120 if no angina. He was grateful for call and agrees with treatment plan.

## 2017-04-23 NOTE — Telephone Encounter (Signed)
Would try to keep HR under 110 bpm. If no angina at that HR could increase target slowly to 120 bpm but would not exceed that. thanks

## 2017-04-24 ENCOUNTER — Telehealth: Payer: Self-pay | Admitting: Internal Medicine

## 2017-04-24 ENCOUNTER — Telehealth: Payer: Self-pay | Admitting: Cardiovascular Disease

## 2017-04-24 NOTE — Telephone Encounter (Signed)
Patient has appointment with Richardson Dopp, PA 1/4.

## 2017-04-24 NOTE — Telephone Encounter (Signed)
New message        [facility] Pre-operative Risk Assessment    Request for surgical clearance:  1. What type of surgery is being performed? colonoscopy  2. When is this surgery scheduled? 05/04/2017  3. Are there any medications that need to be held prior to surgery and how long? Plavix  4. Practice name and name of physician performing surgery? Dr Watt Climes  5. What is your office phone and fax number? Phone [phone], fax [phone] ATTN: Barb  6. Anesthesia type (None, local, MAC, general) ? general   Laurier Nancy 04/24/2017, 4:01 PM  _________________________________________________________________   (provider comments below)

## 2017-04-24 NOTE — Telephone Encounter (Signed)
New message    Patient calling to ask questions about exercising with Fit Bit device, and pacemaker interaction. Please call

## 2017-04-24 NOTE — Telephone Encounter (Signed)
   Primary Cardiologist: Dr. Burt Knack   Chart reviewed as part of pre-operative protocol coverage.   Pt just had DES placement < 30 days ago.   [facility] 04/13/17 Conclusion   1. Severe native vessel CAD With total occlusion of the ramus branch, LAD, and RCA 2. S/P CABG with continued patency of the LIMA-LAD and moderate stenosis at it's coronary anastomosis 3. Severe stenosis of the SVG-PDA treated successfully with DES implantation (distal in-stent restenosis) and de-novo mid vessel disease    He will need to delay colonoscopy for now given treatment with DAPT. Will route to Dr. Burt Knack for review. He will notify pt when Plavix can be held for colonoscopy.    Lyda Jester, PA-C 04/24/2017, 4:13 PM

## 2017-04-24 NOTE — Telephone Encounter (Signed)
Patient reported having recent stents placed which have lead to greater energy and a desire to exercise. Patient stated that his HR was roughly 100 during activity when he checked it with a fitbit and asked if this was safe with his pacemaker. I encouraged that as long as he was feeling good during exercise this was perfectly safe and an acceptable HR to have during activity. He verbalized understanding.

## 2017-04-26 NOTE — Telephone Encounter (Signed)
Agree. Should not have any elective procedure for at least 6 months.

## 2017-04-30 ENCOUNTER — Encounter: Payer: Self-pay | Admitting: Cardiovascular Disease

## 2017-04-30 ENCOUNTER — Ambulatory Visit: Payer: Medicare HMO | Admitting: Cardiovascular Disease

## 2017-04-30 VITALS — BP 134/70 | HR 60 | Ht 68.0 in | Wt 174.8 lb

## 2017-04-30 DIAGNOSIS — I25119 Atherosclerotic heart disease of native coronary artery with unspecified angina pectoris: Secondary | ICD-10-CM | POA: Diagnosis not present

## 2017-04-30 DIAGNOSIS — I2510 Atherosclerotic heart disease of native coronary artery without angina pectoris: Secondary | ICD-10-CM

## 2017-04-30 DIAGNOSIS — Z9861 Coronary angioplasty status: Secondary | ICD-10-CM

## 2017-04-30 LAB — BASIC METABOLIC PANEL
BUN / CREAT RATIO: 24 (ref 10–24)
BUN: 49 mg/dL — AB (ref 8–27)
CHLORIDE: 101 mmol/L (ref 96–106)
CO2: 24 mmol/L (ref 20–29)
CREATININE: 2.04 mg/dL — AB (ref 0.76–1.27)
Calcium: 9.5 mg/dL (ref 8.6–10.2)
GFR calc non Af Amer: 32 mL/min/{1.73_m2} — ABNORMAL LOW (ref >59)
GFR, EST AFRICAN AMERICAN: 37 mL/min/{1.73_m2} — AB (ref >59)
Glucose: 128 mg/dL — ABNORMAL HIGH (ref 65–99)
Potassium: 5 mmol/L (ref 3.5–5.2)
Sodium: 140 mmol/L (ref 134–144)

## 2017-04-30 MED ORDER — RANOLAZINE ER 1000 MG PO TB12: 1000.0000 mg | ORAL_TABLET | Freq: Two times a day (BID) | ORAL | 3 refills | Status: DC

## 2017-04-30 MED ORDER — AMLODIPINE BESYLATE 10 MG PO TABS: 10.0000 mg | ORAL_TABLET | Freq: Every day | ORAL | 3 refills | Status: DC

## 2017-04-30 MED ORDER — ROSUVASTATIN CALCIUM 20 MG PO TABS: 20.0000 mg | ORAL_TABLET | Freq: Every day | ORAL | 3 refills | Status: DC

## 2017-04-30 NOTE — Progress Notes (Signed)
Cardiology Office Note Date:  05/02/2017   ID:  Kyle Avila, DOB 06/05/1945, MRN [id number]  PCP:  Kyle Solian, MD  Cardiologist:  [name], MD    Chief Complaint  Patient presents with  . Chest Pain     History of Present Illness: Kyle Avila is a 72 y.o. male who presents for follow-up of CAD and chronic angina.   He has extensive CAD and underwent initial CABG in 1988 followed by redo CABG in 1996.  He has undergone PCI procedures to his vein grafts.  Also has undergone stenting of the native circumflex.  Patient is here alone today.  He recently presented with progressive angina and underwent stenting of the saphenous vein graft to RCA April 14, 2017.  He is back to his normal activities. His HR with walking is in the 90-110 bpm range. He has not 'pushed it' more than that. He is able to do leg exercises without symptoms. He does experience angina with aerobic activity such as brisk walking or use of the treadmill. He takes a NTG with exercise because he does experience anginal symptoms without the use of NTG. He is then able to keep going without further symptoms.     Past Medical History:  Diagnosis Date  . Arthritis    "some in my back" (04/14/2017)  . CAD (coronary artery disease)    s/p CABG twice, PCI twice  . Carotid artery occlusion    s/p CEA  . Chronic renal insufficiency   . CKD (chronic kidney disease), stage III ([place])   . Exertional angina ([place]) 11/03/2014  . Gout   . Heart block    s/p PTVDP  . HTN (hypertension)   . Hx of cardiovascular stress test    Adenosine Myoview (11/15):  Inferolateral and anterolateral infarct without significant ischemia, EF 42% - Intermediate Risk  . Hyperlipidemia   . 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 Mitral insufficiency   . Myocardial infarction ([place])    "I've had some mild one" (04/14/2017)  . Presence of permanent cardiac pacemaker   . Skin cancer    "nose"  . 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 ACHILLES TENDON REPAIR Right   . CARDIAC CATHETERIZATION    . CARDIAC CATHETERIZATION N/A 11/03/2014   Procedure: Left Heart Cath and Cors/Grafts Angiography;  Surgeon: [name], MD;  Location: [facility];  Service: Cardiovascular;  Laterality: N/A;  . CARDIAC CATHETERIZATION N/A 11/03/2014   Procedure: Coronary Stent Intervention;  Surgeon: [name], MD;  Location: [facility];  Service: Cardiovascular;  Laterality: N/A;  . CAROTID ENDARTERECTOMY  2002   "? side"  . CATARACT EXTRACTION W/ INTRAOCULAR LENS IMPLANT     "? side"  . CORONARY ANGIOPLASTY WITH STENT PLACEMENT  04/14/2017  . CORONARY ANGIOPLASTY WITH STENT PLACEMENT  11/03/2014   SVG PVA  . CORONARY ARTERY BYPASS GRAFT     with redo cabg  . EP IMPLANTABLE DEVICE N/A 09/13/2014   Procedure: PPM Generator Changeout;  Surgeon: Deboraha Sprang, MD;  Location: [facility];  Service: Cardiovascular;  Laterality: N/A;  . LEFT HEART CATH AND CORS/GRAFTS ANGIOGRAPHY N/A 04/14/2017   Procedure: LEFT HEART CATH AND CORS/GRAFTS ANGIOGRAPHY;  Surgeon: [name], MD;  Location: [facility];  Service: Cardiovascular;  Laterality: N/A;  . MOHS SURGERY     "just  outside my nose"  . PACEMAKER INSERTION  2007  . TONSILLECTOMY  1958    Current Outpatient Medications  Medication Sig Dispense Refill  . acetaminophen (TYLENOL) 325 MG tablet Take 2 tablets (650 mg total) by mouth every 4 (four) hours as needed for headache or mild pain.    Marland Kitchen allopurinol (ZYLOPRIM) 100 MG tablet Take 100 mg by mouth daily.     Marland Kitchen amLODipine (NORVASC) 10 MG tablet Take 1 tablet (10 mg total) by mouth daily. 90 tablet 3  . aspirin EC 81 MG EC tablet Take 81 mg by mouth daily.  1 tablet 0  . clopidogrel (PLAVIX) 75 MG tablet Take 1 tablet (75 mg total) by mouth daily. . 90 tablet 2  . colchicine 0.6 MG  tablet Take 0.6 mg by mouth daily as needed (gout flare).     . furosemide (LASIX) 40 MG tablet Take 1 tablet (40 mg total) by mouth daily. 30 tablet   . GUAIFENESIN PO Take 400 mg by mouth daily.     . isosorbide mononitrate (IMDUR) 60 MG 24 hr tablet Take 60 mg by mouth daily.    Marland Kitchen ketoconazole (NIZORAL) 2 % cream Apply 1 application topically 2 (two) times daily as needed for irritation (itching).     Marland Kitchen losartan (COZAAR) 100 MG tablet Take 1 tablet (100 mg total) by mouth 2 (two) times daily. 180 tablet 3  . meclizine (ANTIVERT) 25 MG tablet Take 25 mg by mouth 3 (three) times daily as needed for dizziness.     . metoprolol succinate (TOPROL-XL) 50 MG 24 hr tablet Take 50 mg by mouth 2 (two) times daily. Verified that it is succinate twice daily    . Multiple Vitamins-Minerals (PRESERVISION AREDS 2 PO) Take by mouth 2 times daily at 12 noon and 4 pm.    . nitroGLYCERIN (NITROSTAT) 0.4 MG SL tablet PLACE ONE TABLET UNDER THE TONGUE EVERY FIVE MINUTES AS NEEDED FOR CHEST PAIN (Patient taking differently: Place 0.4 mg under the tongue every 5 (five) minutes as needed for chest pain. PLACE ONE TABLET UNDER THE TONGUE EVERY FIVE MINUTES AS NEEDED FOR CHEST PAIN) 25 tablet 3  . ranolazine (RANEXA) 1000 MG SR tablet Take 1 tablet (1,000 mg total) by mouth 2 (two) times daily. 180 tablet 3  . rosuvastatin (CRESTOR) 20 MG tablet Take 1 tablet (20 mg total) by mouth daily. 90 tablet 3  . tamsulosin (FLOMAX) 0.4 MG CAPS 0.4 mg daily. Take 0.4 mg by mouth once daily     No current facility-administered medications for this visit.     Allergies:   Patient has no known allergies.   Social History:  The patient  reports that  has never smoked. he has never used smokeless tobacco. He reports that he drinks about 1.8 oz of alcohol per week. He reports that he does not use drugs.   Family History:  [name] family history includes Cancer in his mother; Heart attack in his father; Heart disease in his  father.    ROS:  Please see the history of present illness.  Otherwise, review of systems is positive for cough.  All other systems are reviewed and negative.    PHYSICAL EXAM: VS:  BP 134/70   Pulse 60   Ht 5' 8"  (1.727 m)   Wt 174 lb 12.8 oz (79.3 kg)   BMI 26.58 kg/m  , BMI Body mass index is 26.58 kg/m. GEN: Well nourished, well developed, in no acute distress  HEENT: normal  Neck: no JVD, no masses. No carotid bruits Cardiac: RRR without murmur or gallop                Respiratory:  clear to auscultation bilaterally, normal work of breathing GI: soft, nontender, nondistended, + BS MS: no deformity or atrophy  Ext: no pretibial edema, pedal pulses 2+= bilaterally.  Mild bruising in the left arm but no firm hematoma or tenderness. Skin: warm and dry, no rash Neuro:  Strength and sensation are intact Psych: euthymic mood, full affect  EKG:  EKG is ordered today. The ekg ordered today shows AV dual paced rhythm.  Recent Labs: 04/15/2017: Hemoglobin 13.5; Platelets 166 04/30/2017: BUN 49; Creatinine, Ser 2.04; Potassium 5.0; Sodium 140   Lipid Panel     Component Value Date/Time   CHOL 159 02/12/2016 0803   TRIG 130 02/12/2016 0803   HDL 46 02/12/2016 0803   CHOLHDL 3.5 02/12/2016 0803   VLDL 26 02/12/2016 0803   LDLCALC 87 02/12/2016 0803      Wt Readings from Last 3 Encounters:  04/30/17 174 lb 12.8 oz (79.3 kg)  04/14/17 170 lb (77.1 kg)  04/09/17 178 lb 12.8 oz (81.1 kg)     Cardiac Studies Reviewed: Conclusion   1. Severe native vessel CAD With total occlusion of the ramus branch, LAD, and RCA 2. S/P CABG with continued patency of the LIMA-LAD and moderate stenosis at it's coronary anastomosis 3. Severe stenosis of the SVG-PDA treated successfully with DES implantation (distal in-stent restenosis) and de-novo mid vessel disease  Plan: hydration overnight, check metabolic panel in am, continue lifelong DAPT with ASA and clopidogrel.   Indications    Coronary artery disease with exertional angina (HCC) [B14.782 (ICD-10-CM)]  Procedural Details/Technique   Technical Details INDICATION: 72 yo male with DM, CKD 3, and extensive coronary and vascular disease with 2 prior CABG surgeries and multiple PCI procedures presents with progressive CCS 3 sx's of angina on maximal medical therapy.   PROCEDURAL DETAILS: The left wrist was prepped, draped, and anesthetized with 1% lidocaine. Using the modified Seldinger technique, a 5/6 French Slender sheath was introduced into the left radial artery. 3 mg of verapamil was administered through the sheath, weight-based unfractionated heparin was administered intravenously. Standard Judkins catheters were used for selective coronary angiography, SVG angio, and LIMA angio. LV pressure is recorded with a MPA catheter. The native RCA is known to be occluded and it is not selectively injected. PCI is performed after the diagnostic procedure. Catheter exchanges were performed over an exchange length guidewire. There were no immediate procedural complications. A TR band was used for radial hemostasis at the completion of the procedure. The patient was transferred to the post catheterization recovery area for further monitoring.    Estimated blood loss <50 mL.  During this procedure the patient was administered the following to achieve and maintain moderate conscious sedation: Versed 2 mg, Fentanyl 50 mcg, while the patient's heart rate, blood pressure, and oxygen saturation were continuously monitored. The period of conscious sedation was 76 minutes, of which I was present face-to-face 100% of this time.  Coronary Findings   Diagnostic  Dominance: Right  Left Main  LM lesion 100% stenosed  LM lesion is 100% stenosed. The lesion is calcified.  Left Anterior Descending  Ost LAD lesion 100% stenosed  Ost LAD lesion is 100% stenosed. The lesion is calcified.  Mid LAD lesion 75% stenosed  Mid LAD lesion is 75%  stenosed. lesion at LIMA anastomosis.  slight progression from previous cath study.  Mid LAD to Dist LAD lesion 40% stenosed  Mid LAD to Dist LAD lesion is 40% stenosed. There is a 40% lesion in the LAD just beyond the graft insertion site. This does not appear to be flow-limiting.  Ramus Intermedius  Collaterals  Ramus filled by collaterals from 2nd Sept.    Ost Ramus to Ramus lesion 100% stenosed  Ost Ramus to Ramus lesion is 100% stenosed.  Left Circumflex  Ost Cx to Mid Cx lesion 20% stenosed  Ost Cx to Mid Cx lesion is 20% stenosed. The lesion is segmental. The lesion was previously treatedover 2 years ago.  First Obtuse Marginal Branch  1st Mrg lesion 100% stenosed  1st Mrg lesion is 100% stenosed.  Right Coronary Artery  The RCA is known to be chronically occluded. The vessel is not selectively injected.  Ost RCA lesion 100% stenosed  Ost RCA lesion is 100% stenosed.  Free Graft to RPDA  and is large.  Prox Graft lesion 60% stenosed  Prox Graft lesion is 60% stenosed.  Dist Graft lesion 90% stenosed  Dist Graft lesion is 90% stenosed. The lesion is discrete. The lesion was previously treated using a drug eluting stent over 2 years ago. There is severe in-stent restenosis within the previously implanted stent in the distal body of the SVG  Free Graft to 1st Mrg  Known to be occluded  Prox Graft lesion 100% stenosed  Prox Graft lesion is 100% stenosed.  Free LIMA Graft to Mid LAD  LIMA and is normal in caliber. The graft exhibits no disease.  Intervention   Prox Graft lesion (Free Graft to RPDA)  Stent  A drug-eluting stent was successfully placed using a STENT PROMUS PREM MR 4.0X16. Post-stent angioplasty was not performed. There is a moderate lesion in the mid-body of the graft, new from the previous study in 2016. The area appeared irregular after the distal lesion was treated. I elected to primarily stent this segment with a 4.0x16 mm Promus DES deployed at 14 atm. There is  10% residual stenosis and TIMI-3 flow at the completion.  Post-Intervention Lesion Assessment  There is a 10% residual stenosis post intervention.  Dist Graft lesion (Free Graft to RPDA)  Stent  Lesion crossed with guidewire using a WIRE COUGAR XT STRL 190CM. A drug-eluting stent was successfully placed using a STENT PROMUS PREM MR 4.0X24. Stent overlaps previously placed stent. Post-stent angioplasty was performed using a BALLOON [place] EUPHORA [id number]. There is severe 90% stenosis within the previously implanted stent. Heparin is used for anticoagulation and a therapeutic ACT is achieved. A cougar wire is advanced beyond the lesion and the lesion is dilated with a 2.5 mm balloon followed by a 4.16m Wolverine atherectomy balloon. There was significant residual stenosis within and along the stent borders and I felt this to be a suboptimal result. The segment is then stented with a 4.0x24 mm Promus DES deployed at 16 atm and post-dilated with a 4.5 mm Crowder balloon to 16 atm with 0% residual stenosis.  Post-Intervention Lesion Assessment  There is no residual stenosis post intervention.  Coronary Diagrams   Diagnostic Diagram       Post-Intervention Diagram        ASSESSMENT AND PLAN: 1.  CAD, native vessel and bypass graft disease, with angina: The patient continues to have anginal symptoms, currently CCS class II.  His symptoms are improved since undergoing PCI.  We reviewed his cath films together today.  He  does have moderately tight residual stenosis at the LAD just after the touchdown of the LIMA graft.  This is progressed only slightly since his last catheterization.  Considering his chronic kidney disease, I think it is probably best to continue to manage him medically unless there is another change in his anginal threshold.  Will increase amlodipine to 10 mg, continue isosorbide, and continue metoprolol succinate at the current dose.  He remains on long-term dual antiplatelet therapy with  aspirin and clopidogrel.  2.  Hyperlipidemia: Most recent lipids reviewed.  Has had some dose limitation with statin drugs because of side effects.  Will increase Crestor to 20 mg daily as tolerated.  3.  Chronic kidney disease, stage IV: The patient has not had another metabolic panel since the day after his PCI procedure.  Will update his labs.  He continues losartan.  4.  Hypertension: Controlled on multidrug antihypertensive therapy.  Current medicines are reviewed with the patient today.  The patient does not have concerns regarding medicines.  Labs/ tests ordered today include:   Orders Placed This Encounter  Procedures  . Basic metabolic panel  . EKG 12-Lead    Disposition:   FU 4 months  Signed, [name], MD  05/02/2017 3:48 PM    [facility] [name], [place], [place]  [address] Phone: [phone]; Fax: [phone]

## 2017-04-30 NOTE — Patient Instructions (Addendum)
Medication Instructions:  1) INCREASE AMLODIPINE to 10 mg daily 2) INCREASE CRESTOR to 20 mg daily  Labwork: TODAY: BMET  Testing/Procedures: None  Follow-Up: Your provider recommends that you schedule a follow-up appointment in 4 MONTHS with Dr. Burt Knack.  Any Other Special Instructions Will Be Listed Below (If Applicable).     If you need a refill on your cardiac medications before your next appointment, please call your pharmacy.

## 2017-05-01 ENCOUNTER — Ambulatory Visit: Payer: Medicare HMO | Admitting: Physician Assistant

## 2017-05-02 ENCOUNTER — Encounter: Payer: Self-pay | Admitting: Cardiovascular Disease

## 2017-05-04 ENCOUNTER — Telehealth (HOSPITAL_COMMUNITY): Payer: Self-pay | Admitting: *Deleted

## 2017-05-04 NOTE — Telephone Encounter (Signed)
-----   Message from [name], MD sent at 05/03/2017  9:38 PM EST ----- Regarding: RE: Ok to participate in cardiac rehab Yes that would be fine, thanks Faithlynn Deeley ----- Message ----- From: Rowe Pavy, RN Sent: 05/02/2017   8:02 PM To: [name], MD Subject: Madaline Brilliant to participate in cardiac rehab             Dr. Burt Knack,  The above patient referred to cardiac rehab phase II s/p DES to SVG to PDA.  I reviewed your office note and pt is noted to take NTG prior to aerobic activity. Based upon your assessment, do you feel pt is appropriate and stable to participate in cardiac rehab?  If so, should pt premedicate with NTG prior to engaging in exercise at cardiac rehab?  Thank you for your advisement Maurice Small RN, BSN Cardiac and Pulmonary Rehab Nurse Navigator

## 2017-05-07 ENCOUNTER — Telehealth: Payer: Self-pay | Admitting: Cardiovascular Disease

## 2017-05-07 NOTE — Telephone Encounter (Signed)
Patient states he has some simvastatin at his house and wants to know if he could use that with Crestor. Informed him he should NOT use both simvastatin and Crestor - he should only be taking Crestor 20 mg daily. He will come fasting to next appointment in case Dr. Burt Knack wants to recheck cholesterol. He was grateful for call and agrees with treatment plan.

## 2017-05-07 NOTE — Telephone Encounter (Signed)
New message     Patient calling with questions about rosuvastatin (CRESTOR) 20 MG tablet.  Pt c/o medication issue:  1. Name of Medication: rosuvastatin (CRESTOR) 20 MG tablet  2. How are you currently taking this medication (dosage and times per day)? As prescribed 3. Are you having a reaction (difficulty breathing--STAT)? No  4. What is your medication issue? Questions about dosage

## 2017-05-08 ENCOUNTER — Ambulatory Visit: Payer: Medicare HMO | Admitting: Physician Assistant

## 2017-05-12 ENCOUNTER — Telehealth: Payer: Self-pay | Admitting: Cardiovascular Disease

## 2017-05-12 NOTE — Telephone Encounter (Signed)
New Message   Patient just wanted to let Dr. Burt Knack know that the stents have been working well. Thank you very, very much.

## 2017-05-13 ENCOUNTER — Telehealth (HOSPITAL_COMMUNITY): Payer: Self-pay

## 2017-05-13 NOTE — Telephone Encounter (Signed)
thx for letting me know 

## 2017-05-13 NOTE — Telephone Encounter (Signed)
Called and spoke with patient in regards to Cardiac Rehab - Patient is currently in [place]. Patient returns from vacation on 05/16/2017. Patient stated he wanted to stop by the facility to take a look at the gym on Wednesday. Will follow up if I do not hear or see patient.

## 2017-05-18 ENCOUNTER — Telehealth (HOSPITAL_COMMUNITY): Payer: Self-pay

## 2017-05-18 NOTE — Telephone Encounter (Signed)
Patient called and stated he has returned from [place] - While in [place] he had to take 2-3 nitro. I explained to patient that he needs to give his Dr a call to inform them that he had to take those before we schedule for Cardiac Rehab. Patient is stopping by the facility today between 11-11:30am.

## 2017-05-22 ENCOUNTER — Telehealth (HOSPITAL_COMMUNITY): Payer: Self-pay | Admitting: *Deleted

## 2017-05-22 NOTE — Telephone Encounter (Signed)
-----   Message from [name], MD sent at 05/21/2017 11:29 PM EST ----- Regarding: RE: Madaline Brilliant to participate in cardiac rehab Yes he's fine to start cardiac rehab. thanks ----- Message ----- From: Rowe Pavy, RN Sent: 05/18/2017  12:41 PM To: [name], MD Subject: Melton Alar: Ok to participate in cardiac rehab          Thank you for your previous response on 1/7.  FYI: Pt stopped by rehab today for a tour, he was unsure he wanted to do the CR program.  He is interested and plans to attend for one month. In talking with pt about CR, he stated that he had taken NTG on 2 episodes while skiing in [place].  He has not had any since returning to [place].   Okay to proceed as planned? Thanks Cherre Huger, BSN Cardiac and Pulmonary Rehab Nurse Navigator   ----- Message ----- From: Rowe Pavy, RN Sent: 05/02/2017   8:02 PM To: [name], MD Subject: Madaline Brilliant to participate in cardiac rehab             Dr. Burt Knack,  The above patient referred to cardiac rehab phase II s/p DES to SVG to PDA.  I reviewed your office note and pt is noted to take NTG prior to aerobic activity. Based upon your assessment, do you feel pt is appropriate and stable to participate in cardiac rehab?  If so, should pt premedicate with NTG prior to engaging in exercise at cardiac rehab?  Thank you for your advisement Maurice Small RN, BSN Cardiac and Pulmonary Rehab Nurse Navigator

## 2017-06-03 ENCOUNTER — Encounter (HOSPITAL_COMMUNITY): Payer: Medicare HMO

## 2017-06-05 ENCOUNTER — Encounter (HOSPITAL_COMMUNITY): Payer: Medicare HMO

## 2017-06-08 ENCOUNTER — Encounter (HOSPITAL_COMMUNITY): Payer: Medicare HMO

## 2017-06-10 ENCOUNTER — Encounter (HOSPITAL_COMMUNITY): Payer: Medicare HMO

## 2017-06-12 ENCOUNTER — Encounter (HOSPITAL_COMMUNITY): Payer: Medicare HMO

## 2017-06-15 ENCOUNTER — Encounter (HOSPITAL_COMMUNITY): Payer: Medicare HMO

## 2017-06-17 ENCOUNTER — Encounter (HOSPITAL_COMMUNITY): Payer: Medicare HMO

## 2017-06-19 ENCOUNTER — Encounter (HOSPITAL_COMMUNITY): Payer: Medicare HMO

## 2017-06-22 ENCOUNTER — Encounter (HOSPITAL_COMMUNITY): Payer: Medicare HMO

## 2017-06-22 ENCOUNTER — Telehealth (HOSPITAL_COMMUNITY): Payer: Self-pay

## 2017-06-23 ENCOUNTER — Telehealth: Payer: Self-pay | Admitting: Cardiology

## 2017-06-23 ENCOUNTER — Ambulatory Visit (INDEPENDENT_AMBULATORY_CARE_PROVIDER_SITE_OTHER): Payer: Medicare HMO | Admitting: *Deleted

## 2017-06-23 DIAGNOSIS — I442 Atrioventricular block, complete: Secondary | ICD-10-CM | POA: Diagnosis not present

## 2017-06-23 NOTE — Telephone Encounter (Signed)
LMOVM reminding pt to send remote transmission.   

## 2017-06-24 ENCOUNTER — Ambulatory Visit: Payer: Medicare HMO | Admitting: Internal Medicine

## 2017-06-24 ENCOUNTER — Encounter: Payer: Self-pay | Admitting: Internal Medicine

## 2017-06-24 ENCOUNTER — Encounter (HOSPITAL_COMMUNITY): Payer: Medicare HMO

## 2017-06-24 ENCOUNTER — Telehealth (HOSPITAL_COMMUNITY): Payer: Self-pay

## 2017-06-24 VITALS — BP 118/60 | HR 62 | Ht 68.0 in | Wt 174.8 lb

## 2017-06-24 DIAGNOSIS — I442 Atrioventricular block, complete: Secondary | ICD-10-CM

## 2017-06-24 DIAGNOSIS — Z950 Presence of cardiac pacemaker: Secondary | ICD-10-CM

## 2017-06-24 NOTE — Progress Notes (Signed)
Patient Care Team: [name], MD as PCP - General (Internal Medicine) [name], MD as PCP - Cardiology (Cardiology) Fleet Contras, MD as Consulting Physician (Nephrology) Deboraha Sprang, MD (Cardiology)   HPI  Kyle Avila is a 72 y.o. 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  His device reached ERI and he underwent generator replacement 5/16   He has a complex cardiovascular history with bypass surgery and redo bypass in 96 and revascularization with stenting of the circumflex in 2009. At that time he had total LAD and RCA disease; his left ventricular function then was okay.    DATE TEST EF   11/15 Myoview  42 % Anterolateral defect  2/18 Echo   45-50 %   12/18 Cath   S/P CABG patency  LIMA-LAD and moderate stenosis at   anastomosis Stenosis of the SVG-PDA- DESX2  implantation In-stent restenosis and de-novo mid vessel disease    He is to stay on long-term dual therapy per Dr. Billee Cashing   He ended up with significant exercise intolerance and chest pain 12/18 and underwent repeat catheterizations with findings as above.  He has skied twice since without significant limitations particularly the second time.  He denies shortness of breath or edema or palpitations.      Past Medical History:  Diagnosis Date  . Arthritis    "some in my back" (04/14/2017)  . CAD (coronary artery disease)    s/p CABG twice, PCI twice  . Carotid artery occlusion    s/p CEA  . Chronic renal insufficiency   . CKD (chronic kidney disease), stage III ([place])   . Exertional angina ([place]) 11/03/2014  . Gout   . Heart block    s/p PTVDP  . HTN (hypertension)   . Hx of cardiovascular stress test    Adenosine Myoview (11/15):  Inferolateral and anterolateral infarct without significant ischemia, EF 42% - Intermediate Risk  . Hyperlipidemia   . 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 Mitral insufficiency   .  Myocardial infarction ([place])    "I've had some mild one" (04/14/2017)  . Presence of permanent cardiac pacemaker   . Skin cancer    "nose"  . 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 ACHILLES TENDON REPAIR Right   . CARDIAC CATHETERIZATION    . CARDIAC CATHETERIZATION N/A 11/03/2014   Procedure: Left Heart Cath and Cors/Grafts Angiography;  Surgeon: [name], MD;  Location: [facility];  Service: Cardiovascular;  Laterality: N/A;  . CARDIAC CATHETERIZATION N/A 11/03/2014   Procedure: Coronary Stent Intervention;  Surgeon: [name], MD;  Location: [facility];  Service: Cardiovascular;  Laterality: N/A;  . CAROTID ENDARTERECTOMY  2002   "? side"  . CATARACT EXTRACTION W/ INTRAOCULAR LENS IMPLANT     "? side"  . CORONARY ANGIOPLASTY WITH STENT PLACEMENT  04/14/2017  . CORONARY ANGIOPLASTY WITH STENT PLACEMENT  11/03/2014   SVG PVA  . CORONARY ARTERY BYPASS GRAFT     with redo cabg  . EP IMPLANTABLE DEVICE N/A 09/13/2014   Procedure: PPM Generator Changeout;  Surgeon: Deboraha Sprang, MD;  Location: [facility];  Service: Cardiovascular;  Laterality: N/A;  . LEFT HEART CATH AND CORS/GRAFTS ANGIOGRAPHY N/A 04/14/2017   Procedure: LEFT HEART CATH AND CORS/GRAFTS ANGIOGRAPHY;  Surgeon: [name], MD;  Location: [facility];  Service: Cardiovascular;  Laterality: N/A;  . MOHS SURGERY     "just outside my nose"  . PACEMAKER INSERTION  2007  . TONSILLECTOMY  1958    Current Outpatient Medications  Medication Sig Dispense Refill  . acetaminophen (TYLENOL) 325 MG tablet Take 2 tablets (650 mg total) by mouth every 4 (four) hours as needed for headache or mild pain.    Marland Kitchen allopurinol (ZYLOPRIM) 100 MG tablet Take 100 mg by mouth daily.     Marland Kitchen amLODipine (NORVASC) 10 MG tablet Take 1 tablet (10 mg total) by mouth daily. 90 tablet 3  . aspirin EC 81 MG EC tablet Take 81 mg by mouth daily.   1 tablet 0  . clopidogrel (PLAVIX) 75 MG tablet Take 1 tablet (75 mg total) by mouth daily. . 90 tablet 2  . colchicine 0.6 MG tablet Take 0.6 mg by mouth daily as needed (gout flare).     . furosemide (LASIX) 40 MG tablet Take 1 tablet (40 mg total) by mouth daily. 30 tablet   . GUAIFENESIN PO Take 400 mg by mouth daily.     . isosorbide mononitrate (IMDUR) 60 MG 24 hr tablet Take 60 mg by mouth daily.    Marland Kitchen ketoconazole (NIZORAL) 2 % cream Apply 1 application topically 2 (two) times daily as needed for irritation (itching).     Marland Kitchen losartan (COZAAR) 100 MG tablet Take 1 tablet (100 mg total) by mouth 2 (two) times daily. 180 tablet 3  . meclizine (ANTIVERT) 25 MG tablet Take 25 mg by mouth 3 (three) times daily as needed for dizziness.     . metoprolol succinate (TOPROL-XL) 50 MG 24 hr tablet Take 50 mg by mouth 2 (two) times daily. Verified that it is succinate twice daily    . Multiple Vitamins-Minerals (PRESERVISION AREDS 2 PO) Take by mouth 2 times daily at 12 noon and 4 pm.    . nitroGLYCERIN (NITROSTAT) 0.4 MG SL tablet PLACE ONE TABLET UNDER THE TONGUE EVERY FIVE MINUTES AS NEEDED FOR CHEST PAIN (Patient taking differently: Place 0.4 mg under the tongue every 5 (five) minutes as needed for chest pain. PLACE ONE TABLET UNDER THE TONGUE EVERY FIVE MINUTES AS NEEDED FOR CHEST PAIN) 25 tablet 3  . ranolazine (RANEXA) 1000 MG SR tablet Take 1 tablet (1,000 mg total) by mouth 2 (two) times daily. 180 tablet 3  . rosuvastatin (CRESTOR) 20 MG tablet Take 1 tablet (20 mg total) by mouth daily. 90 tablet 3  . tamsulosin (FLOMAX) 0.4 MG CAPS 0.4 mg daily. Take 0.4 mg by mouth once daily     No current facility-administered medications for this visit.     No Known Allergies  Review of Systems negative except from HPI and PMH  Physical Exam BP 118/60   Pulse 62   Ht 5\' 8"  (1.727 m)   Wt 174 lb 12.8 oz (79.3 kg)   SpO2 99%   BMI 26.58 kg/m    Well developed and nourished in no acute  distress HENT normal Neck supple with JVP-flat Clear Regular rate and rhythm, no murmurs or gallops Abd-soft with active BS No Clubbing cyanosis edema Skin-warm and dry A & Oriented  Grossly normal sensory and motor function  ECG 1/19 personally reviewed AV pacing  Assessment and  Plan  Ischemic cardiomyopathy with prior MI  Hypertension   Pacemaker-Medtronic [name] device was interrogated.  The information was reviewed. No changes were made in the programming.  Complete heart block device dependent  PVCs  Sinus node dysfunction   Blood pressures well controlled.  Device function is normal.  PVC count is scant.(500/d)  Will continue current meds  Had a long discussion regarding chronotropic incompetence.  His maximal heart rates are in the low 90s.  However, given his complex coronary disease, reprogramming his device to a normal heart rate excursion with likely provoking angina.  In the absence of symptoms, we will leave him limited by his own heart rate excursion and leave rate response off.  We spent more than 50% of our >25 min visit in face to face counseling regarding the above

## 2017-06-24 NOTE — Telephone Encounter (Signed)
Cardiac Rehab Medication Review by a Pharmacist  Does the patient feel that his/her medications are working for him/her?  yes  Has the patient been experiencing any side effects to the medications prescribed?  no  Does the patient measure his/her own blood pressure or blood glucose at home?  yes - going to input new batteries into his machine.  Last reading: 120/60 mmHg. Pulse: ~60-62.  Does the patient have any problems obtaining medications due to transportation or finances?   No, except Ranexa.   Understanding of regimen: good Understanding of indications: good Potential of compliance: good   Pharmacist comments:  -Patient asked me if there were any medications that needed to be reviewed on his chart. The only "issue" I saw with his medication list is that he is taking his metoprolol succinate twice daily - and that this drug is indicated for once daily. The patient was instructed to bring this up to his cardiologist to consider. -In addition, he states that his Ranexa is very expensive and feels like he no longer needs it given his recent stent placement. I instructed the patient to bring this up to his cardiologist for future consideration as well.   Of note, this patient uses [facility] for acute medications and [facility] mail order for more chronic, long-term medications.  Thank you for allowing pharmacy to be a part of this patient's care.    Avabella Wailes L. Kyung Rudd, PharmD, [place] PGY1 Pharmacy Resident Pager: [phone]

## 2017-06-24 NOTE — Patient Instructions (Addendum)
Medication Instructions:  Your physician recommends that you continue on your current medications as directed. Please refer to the Current Medication list given to you today.  Labwork: None ordered.  Testing/Procedures: None ordered.  Follow-Up: Your physician recommends that you schedule a follow-up appointment in: One year with Tommye Standard, PA.   Remote monitoring is used to monitor your Pacemaker from home. This monitoring reduces the number of office visits required to check your device to one time per year. It allows [place] to keep an eye on the functioning of your device to ensure it is working properly. You are scheduled for a device check from home on 09/22/2017. You may send your transmission at any time that day. If you have a wireless device, the transmission will be sent automatically. After your physician reviews your transmission, you will receive a postcard with your next transmission date.    Any Other Special Instructions Will Be Listed Below (If Applicable).     If you need a refill on your cardiac medications before your next appointment, please call your pharmacy.

## 2017-06-24 NOTE — Progress Notes (Signed)
Remote pacemaker transmission.   

## 2017-06-25 ENCOUNTER — Encounter (HOSPITAL_COMMUNITY): Payer: Self-pay

## 2017-06-25 ENCOUNTER — Encounter: Payer: Self-pay | Admitting: Cardiology

## 2017-06-25 ENCOUNTER — Encounter (HOSPITAL_COMMUNITY)
Admission: RE | Admit: 2017-06-25 | Discharge: 2017-06-25 | Disposition: A | Discharge status: Home / Self Care | Payer: Medicare HMO | Source: Ambulatory Visit | Attending: Cardiovascular Disease | Admitting: Cardiovascular Disease

## 2017-06-25 VITALS — BP 128/58 | HR 56 | Ht 68.5 in | Wt 172.6 lb

## 2017-06-25 DIAGNOSIS — Z955 Presence of coronary angioplasty implant and graft: Secondary | ICD-10-CM

## 2017-06-25 NOTE — Progress Notes (Signed)
SOHAM HOLLETT 72 y.o. male DOB: 01-31-1946 MRN: [id number]      Nutrition Note  1. Stented coronary artery    Past Medical History:  Diagnosis Date  . Arthritis    "some in my back" (04/14/2017)  . CAD (coronary artery disease)    s/p CABG twice, PCI twice  . Carotid artery occlusion    s/p CEA  . Chronic renal insufficiency   . CKD (chronic kidney disease), stage III ([place])   . Exertional angina ([place]) 11/03/2014  . Gout   . Heart block    s/p PTVDP  . HTN (hypertension)   . Hx of cardiovascular stress test    Adenosine Myoview (11/15):  Inferolateral and anterolateral infarct without significant ischemia, EF 42% - Intermediate Risk  . Hyperlipidemia   . 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 Mitral insufficiency   . Myocardial infarction ([place])    "I've had some mild one" (04/14/2017)  . Presence of permanent cardiac pacemaker   . Skin cancer    "nose"  . VT (ventricular tachycardia)-nonsustained 05/20/2011   Meds reviewed.   HT: Ht Readings from Last 1 Encounters:  06/25/17 5' 8.5" (1.74 m)    WT: Wt Readings from Last 3 Encounters:  06/25/17 172 lb 9.9 oz (78.3 kg)  06/24/17 174 lb 12.8 oz (79.3 kg)  04/30/17 174 lb 12.8 oz (79.3 kg)     BMI 25.86   Current tobacco use? No  Labs:  Lipid Panel     Component Value Date/Time   CHOL 159 02/12/2016 0803   TRIG 130 02/12/2016 0803   HDL 46 02/12/2016 0803   CHOLHDL 3.5 02/12/2016 0803   VLDL 26 02/12/2016 0803   LDLCALC 87 02/12/2016 0803    No results found for: HGBA1C CBG (last 3)  No results for input(s): GLUCAP in the last 72 hours.  Nutrition Note Spoke with pt. Nutrition plan and goals reviewed with pt. Pt is following Step 1 of the Therapeutic Lifestyle Changes diet. Pt wants to lose wt. Pt has been trying to lose wt by eating less . Wt loss tips reviewed. Pt expressed understanding of the information reviewed. Pt aware of nutrition education classes offered and  plans on attending nutrition classes.  Nutrition Diagnosis ? Food-and nutrition-related knowledge deficit related to lack of exposure to information as related to diagnosis of: ? CVD ? Overweight related to excessive energy intake as evidenced by a BMI of 25.86  Nutrition Intervention ? Pt's individual nutrition plan and goals reviewed with pt.  Nutrition Goal(s):  ? Pt to identify and limit food sources of saturated fat, trans fat, and sodium ? Pt to identify food quantities necessary to achieve weight loss of 6-13 lb at graduation from cardiac rehab.   Plan:  Pt to attend nutrition classes ? Nutrition I ? Nutrition II ? Portion Distortion  Will provide client-centered nutrition education as part of interdisciplinary care.   Monitor and evaluate progress toward nutrition goal with team.  Derek Mound, M.Ed, RD, LDN, CDE 06/25/2017 11:46 AM

## 2017-06-25 NOTE — Progress Notes (Signed)
Cardiac Individual Treatment Plan  Patient Details  Name: JAVEON MACMURRAY MRN: [id number] Date of Birth: [birth year] Referring Provider:     CARDIAC REHAB PHASE II ORIENTATION from 06/25/2017 in [place]  Referring Provider  [name] MD      Initial Encounter Date:    CARDIAC REHAB PHASE II ORIENTATION from 06/25/2017 in [place]  Date  06/25/17  Referring Provider  [name] MD      Visit Diagnosis: Stented coronary artery 04/14/18 S/P DES to SVG PDA ISR  Patient's Home Medications on Admission:  Current Outpatient Medications:  .  acetaminophen (TYLENOL) 325 MG tablet, Take 2 tablets (650 mg total) by mouth every 4 (four) hours as needed for headache or mild pain., Disp: , Rfl:  .  allopurinol (ZYLOPRIM) 100 MG tablet, Take 100 mg by mouth daily. , Disp: , Rfl:  .  amLODipine (NORVASC) 10 MG tablet, Take 1 tablet (10 mg total) by mouth daily., Disp: 90 tablet, Rfl: 3 .  aspirin EC 81 MG EC tablet, Take 81 mg by mouth daily. , Disp: 1 tablet, Rfl: 0 .  clopidogrel (PLAVIX) 75 MG tablet, Take 1 tablet (75 mg total) by mouth daily. ., Disp: 90 tablet, Rfl: 2 .  colchicine 0.6 MG tablet, Take 0.6 mg by mouth daily as needed (gout flare). , Disp: , Rfl:  .  furosemide (LASIX) 40 MG tablet, Take 1 tablet (40 mg total) by mouth daily., Disp: 30 tablet, Rfl:  .  GUAIFENESIN PO, Take 400 mg by mouth daily. , Disp: , Rfl:  .  isosorbide mononitrate (IMDUR) 60 MG 24 hr tablet, Take 60 mg by mouth daily., Disp: , Rfl:  .  ketoconazole (NIZORAL) 2 % cream, Apply 1 application topically 2 (two) times daily as needed for irritation (itching). , Disp: , Rfl:  .  losartan (COZAAR) 100 MG tablet, Take 1 tablet (100 mg total) by mouth 2 (two) times daily., Disp: 180 tablet, Rfl: 3 .  meclizine (ANTIVERT) 25 MG tablet, Take 25 mg by mouth 3 (three) times daily as needed for dizziness. , Disp: , Rfl:  .  metoprolol succinate  (TOPROL-XL) 50 MG 24 hr tablet, Take 50 mg by mouth 2 (two) times daily. Verified that it is succinate twice daily, Disp: , Rfl:  .  Multiple Vitamins-Minerals (PRESERVISION AREDS 2 PO), Take by mouth 2 times daily at 12 noon and 4 pm., Disp: , Rfl:  .  nitroGLYCERIN (NITROSTAT) 0.4 MG SL tablet, PLACE ONE TABLET UNDER THE TONGUE EVERY FIVE MINUTES AS NEEDED FOR CHEST PAIN (Patient not taking: Reported on 06/24/2017), Disp: 25 tablet, Rfl: 3 .  ranolazine (RANEXA) 1000 MG SR tablet, Take 1 tablet (1,000 mg total) by mouth 2 (two) times daily., Disp: 180 tablet, Rfl: 3 .  rosuvastatin (CRESTOR) 20 MG tablet, Take 1 tablet (20 mg total) by mouth daily., Disp: 90 tablet, Rfl: 3 .  tamsulosin (FLOMAX) 0.4 MG CAPS, 0.4 mg daily. Take 0.4 mg by mouth once daily, Disp: , Rfl:   Past Medical History: Past Medical History:  Diagnosis Date  . Arthritis    "some in my back" (04/14/2017)  . CAD (coronary artery disease)    s/p CABG twice, PCI twice  . Carotid artery occlusion    s/p CEA  . Chronic renal insufficiency   . CKD (chronic kidney disease), stage III ([place])   . Exertional angina ([place]) 11/03/2014  . Gout   . Heart  block    s/p PTVDP  . HTN (hypertension)   . Hx of cardiovascular stress test    Adenosine Myoview (11/15):  Inferolateral and anterolateral infarct without significant ischemia, EF 42% - Intermediate Risk  . Hyperlipidemia   . 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 Mitral insufficiency   . Myocardial infarction ([place])    "I've had some mild one" (04/14/2017)  . Presence of permanent cardiac pacemaker   . Skin cancer    "nose"  . VT (ventricular tachycardia)-nonsustained 05/20/2011    Tobacco Use: Social History   Tobacco Use  Smoking Status Never Smoker  Smokeless Tobacco Never Used    Labs: Recent Review Flowsheet Data    Labs for ITP Cardiac and Pulmonary Rehab Latest Ref Rng & Units 10/26/2014 05/04/2015 02/12/2016   Cholestrol  125 - 200 mg/dL 163 159 159   LDLCALC <130 mg/dL 92 84 87   HDL >=40 mg/dL 45.40 51 46   Trlycerides <150 mg/dL 126.0 118 130      Capillary Blood Glucose: No results found for: GLUCAP   Exercise Target Goals: Date: 06/25/17  Exercise Program Goal: Individual exercise prescription set using results from initial 6 min walk test and THRR while considering  patient's activity barriers and safety.   Exercise Prescription Goal: Initial exercise prescription builds to 30-45 minutes a day of aerobic activity, 2-3 days per week.  Home exercise guidelines will be given to patient during program as part of exercise prescription that the participant will acknowledge.  Activity Barriers & Risk Stratification: Activity Barriers & Cardiac Risk Stratification - 06/25/17 0907      Activity Barriers & Cardiac Risk Stratification   Activity Barriers  Back Problems;Other (comment);Deconditioning;Muscular Weakness    Comments  B, ankle soreness    Cardiac Risk Stratification  High       6 Minute Walk: 6 Minute Walk    Row Name 06/25/17 1143         6 Minute Walk   Phase  Initial     Distance  1730 feet     Walk Time  6 minutes     # of Rest Breaks  0     MPH  3.2     METS  3.4     RPE  11     VO2 Peak  12.19     Symptoms  No     Resting HR  56 bpm     Resting BP  126/58     Resting Oxygen Saturation   98 %     Exercise Oxygen Saturation  during 6 min walk  97 %     Max Ex. HR  74 bpm     Max Ex. BP  144/78     2 Minute Post BP  124/70        Oxygen Initial Assessment:   Oxygen Re-Evaluation:   Oxygen Discharge (Final Oxygen Re-Evaluation):   Initial Exercise Prescription: Initial Exercise Prescription - 06/25/17 1100      Date of Initial Exercise RX and Referring Provider   Date  06/25/17    Referring Provider  [name] MD      Treadmill   MPH  3    Grade  1    Minutes  10    METs  3.71      Bike   Level  0.8    Minutes  10    METs  2.89  NuStep   Level  3    SPM  80    Minutes  10    METs  2.4      Prescription Details   Frequency (times per week)  3    Duration  Progress to 30 minutes of continuous aerobic without signs/symptoms of physical distress      Intensity   THRR 40-80% of Max Heartrate  59-118    Ratings of Perceived Exertion  11-13    Perceived Dyspnea  0-4      Progression   Progression  Continue to progress workloads to maintain intensity without signs/symptoms of physical distress.      Resistance Training   Training Prescription  Yes    Weight  4lbs    Reps  10-15       Perform Capillary Blood Glucose checks as needed.  Exercise Prescription Changes:   Exercise Comments:   Exercise Goals and Review: Exercise Goals    Row Name 06/25/17 0908             Exercise Goals   Increase Physical Activity  Yes       Intervention  Provide advice, education, support and counseling about physical activity/exercise needs.;Develop an individualized exercise prescription for aerobic and resistive training based on initial evaluation findings, risk stratification, comorbidities and participant's personal goals.       Expected Outcomes  Short Term: Attend rehab on a regular basis to increase amount of physical activity.;Long Term: Exercising regularly at least 3-5 days a week.;Long Term: Add in home exercise to make exercise part of routine and to increase amount of physical activity.       Increase Strength and Stamina  Yes       Intervention  Provide advice, education, support and counseling about physical activity/exercise needs.;Develop an individualized exercise prescription for aerobic and resistive training based on initial evaluation findings, risk stratification, comorbidities and participant's personal goals.       Expected Outcomes  Short Term: Increase workloads from initial exercise prescription for resistance, speed, and METs.;Short Term: Perform resistance training exercises routinely during  rehab and add in resistance training at home;Long Term: Improve cardiorespiratory fitness, muscular endurance and strength as measured by increased METs and functional capacity (6MWT)       Able to understand and use rate of perceived exertion (RPE) scale  Yes       Intervention  Provide education and explanation on how to use RPE scale       Expected Outcomes  Short Term: Able to use RPE daily in rehab to express subjective intensity level;Long Term:  Able to use RPE to guide intensity level when exercising independently       Knowledge and understanding of Target Heart Rate Range (THRR)  Yes       Intervention  Provide education and explanation of THRR including how the numbers were predicted and where they are located for reference       Expected Outcomes  Long Term: Able to use THRR to govern intensity when exercising independently;Short Term: Able to state/look up THRR;Short Term: Able to use daily as guideline for intensity in rehab       Able to check pulse independently  Yes       Intervention  Provide education and demonstration on how to check pulse in carotid and radial arteries.;Review the importance of being able to check your own pulse for safety during independent exercise       Expected Outcomes  Short Term: Able to explain why pulse checking is important during independent exercise;Long Term: Able to check pulse independently and accurately       Understanding of Exercise Prescription  Yes       Intervention  Provide education, explanation, and written materials on patient's individual exercise prescription       Expected Outcomes  Short Term: Able to explain program exercise prescription;Long Term: Able to explain home exercise prescription to exercise independently          Exercise Goals Re-Evaluation :    Discharge Exercise Prescription (Final Exercise Prescription Changes):   Nutrition:  Target Goals: Understanding of nutrition guidelines, daily intake of sodium 1500mg ,  cholesterol 200mg , calories 30% from fat and 7% or less from saturated fats, daily to have 5 or more servings of fruits and vegetables.  Biometrics: Pre Biometrics - 06/25/17 1126      Pre Biometrics   Height  5' 8.5" (1.74 m)    Weight  172 lb 9.9 oz (78.3 kg)    Waist Circumference  39 inches    Hip Circumference  39 inches    Waist to Hip Ratio  1 %    BMI (Calculated)  25.86    Triceps Skinfold  14 mm    % Body Fat  26.2 %    Grip Strength  36 kg    Flexibility  10 in    Single Leg Stand  4.63 seconds        Nutrition Therapy Plan and Nutrition Goals: Nutrition Therapy & Goals - 06/25/17 1157      Nutrition Therapy   Diet  Heart Healthy      Personal Nutrition Goals   Nutrition Goal  Pt to identify food quantities necessary to achieve weight loss of 6-13 lb at graduation from cardiac rehab.     Personal Goal #2  Pt to identify and limit food sources of saturated fat, trans fat, and sodium      Intervention Plan   Intervention  Prescribe, educate and counsel regarding individualized specific dietary modifications aiming towards targeted core components such as weight, hypertension, lipid management, diabetes, heart failure and other comorbidities.    Expected Outcomes  Short Term Goal: Understand basic principles of dietary content, such as calories, fat, sodium, cholesterol and nutrients.;Long Term Goal: Adherence to prescribed nutrition plan.       Nutrition Assessments: Nutrition Assessments - 06/25/17 1157      MEDFICTS Scores   Pre Score  45       Nutrition Goals Re-Evaluation:   Nutrition Goals Re-Evaluation:   Nutrition Goals Discharge (Final Nutrition Goals Re-Evaluation):   Psychosocial: Target Goals: Acknowledge presence or absence of significant depression and/or stress, maximize coping skills, provide positive support system. Participant is able to verbalize types and ability to use techniques and skills needed for reducing stress and  depression.  Initial Review & Psychosocial Screening: Initial Psych Review & Screening - 06/25/17 1220      Initial Review   Current issues with  None Identified      Family Dynamics   Good Support System?  Yes patient has wife and friends for support      Barriers   Psychosocial barriers to participate in program  There are no identifiable barriers or psychosocial needs.      Screening Interventions   Interventions  Encouraged to exercise       Quality of Life Scores: Quality of Life - 06/25/17 1124  Quality of Life Scores   Health/Function Pre  22.46 %    Socioeconomic Pre  21.64 %    Psych/Spiritual Pre  20.36 %    Family Pre  15.6 %    GLOBAL Pre  20.75 %      Scores of 19 and below usually indicate a poorer quality of life in these areas.  A difference of  2-3 points is a clinically meaningful difference.  A difference of 2-3 points in the total score of the Quality of Life Index has been associated with significant improvement in overall quality of life, self-image, physical symptoms, and general health in studies assessing change in quality of life.  PHQ-9: Recent Review Flowsheet Data    There is no flowsheet data to display.     Interpretation of Total Score  Total Score Depression Severity:  1-4 = Minimal depression, 5-9 = Mild depression, 10-14 = Moderate depression, 15-19 = Moderately severe depression, 20-27 = Severe depression   Psychosocial Evaluation and Intervention:   Psychosocial Re-Evaluation:   Psychosocial Discharge (Final Psychosocial Re-Evaluation):   Vocational Rehabilitation: Provide vocational rehab assistance to qualifying candidates.   Vocational Rehab Evaluation & Intervention: Vocational Rehab - 06/25/17 1218      Initial Vocational Rehab Evaluation & Intervention   Assessment shows need for Vocational Rehabilitation  No Mr Gerardo does not need vocational rehab       Education: Education Goals: Education classes will be  provided on a weekly basis, covering required topics. Participant will state understanding/return demonstration of topics presented.  Learning Barriers/Preferences: Learning Barriers/Preferences - 06/25/17 0906      Learning Barriers/Preferences   Learning Barriers  Sight    Learning Preferences  Skilled Demonstration;Video       Education Topics: Count Your Pulse:  -Group instruction provided by verbal instruction, demonstration, patient participation and written materials to support subject.  Instructors address importance of being able to find your pulse and how to count your pulse when at home without a heart monitor.  Patients get hands on experience counting their pulse with staff help and individually.   Heart Attack, Angina, and Risk Factor Modification:  -Group instruction provided by verbal instruction, video, and written materials to support subject.  Instructors address signs and symptoms of angina and heart attacks.    Also discuss risk factors for heart disease and how to make changes to improve heart health risk factors.   Functional Fitness:  -Group instruction provided by verbal instruction, demonstration, patient participation, and written materials to support subject.  Instructors address safety measures for doing things around the house.  Discuss how to get up and down off the floor, how to pick things up properly, how to safely get out of a chair without assistance, and balance training.   Meditation and Mindfulness:  -Group instruction provided by verbal instruction, patient participation, and written materials to support subject.  Instructor addresses importance of mindfulness and meditation practice to help reduce stress and improve awareness.  Instructor also leads participants through a meditation exercise.    Stretching for Flexibility and Mobility:  -Group instruction provided by verbal instruction, patient participation, and written materials to support  subject.  Instructors lead participants through series of stretches that are designed to increase flexibility thus improving mobility.  These stretches are additional exercise for major muscle groups that are typically performed during regular warm up and cool down.   Hands Only CPR:  -Group verbal, video, and participation provides a basic overview of AHA guidelines  for community CPR. Role-play of emergencies allow participants the opportunity to practice calling for help and chest compression technique with discussion of AED use.   Hypertension: -Group verbal and written instruction that provides a basic overview of hypertension including the most recent diagnostic guidelines, risk factor reduction with self-care instructions and medication management.    Nutrition I class: Heart Healthy Eating:  -Group instruction provided by PowerPoint slides, verbal discussion, and written materials to support subject matter. The instructor gives an explanation and review of the Therapeutic Lifestyle Changes diet recommendations, which includes a discussion on lipid goals, dietary fat, sodium, fiber, plant stanol/sterol esters, sugar, and the components of a well-balanced, healthy diet.   Nutrition II class: Lifestyle Skills:  -Group instruction provided by PowerPoint slides, verbal discussion, and written materials to support subject matter. The instructor gives an explanation and review of label reading, grocery shopping for heart health, heart healthy recipe modifications, and ways to make healthier choices when eating out.   Diabetes Question & Answer:  -Group instruction provided by PowerPoint slides, verbal discussion, and written materials to support subject matter. The instructor gives an explanation and review of diabetes co-morbidities, pre- and post-prandial blood glucose goals, pre-exercise blood glucose goals, signs, symptoms, and treatment of hypoglycemia and hyperglycemia, and foot care  basics.   Diabetes Blitz:  -Group instruction provided by PowerPoint slides, verbal discussion, and written materials to support subject matter. The instructor gives an explanation and review of the physiology behind type 1 and type 2 diabetes, diabetes medications and rational behind using different medications, pre- and post-prandial blood glucose recommendations and Hemoglobin A1c goals, diabetes diet, and exercise including blood glucose guidelines for exercising safely.    Portion Distortion:  -Group instruction provided by PowerPoint slides, verbal discussion, written materials, and food models to support subject matter. The instructor gives an explanation of serving size versus portion size, changes in portions sizes over the last 20 years, and what consists of a serving from each food group.   Stress Management:  -Group instruction provided by verbal instruction, video, and written materials to support subject matter.  Instructors review role of stress in heart disease and how to cope with stress positively.     Exercising on Your Own:  -Group instruction provided by verbal instruction, power point, and written materials to support subject.  Instructors discuss benefits of exercise, components of exercise, frequency and intensity of exercise, and end points for exercise.  Also discuss use of nitroglycerin and activating EMS.  Review options of places to exercise outside of rehab.  Review guidelines for sex with heart disease.   Cardiac Drugs I:  -Group instruction provided by verbal instruction and written materials to support subject.  Instructor reviews cardiac drug classes: antiplatelets, anticoagulants, beta blockers, and statins.  Instructor discusses reasons, side effects, and lifestyle considerations for each drug class.   Cardiac Drugs II:  -Group instruction provided by verbal instruction and written materials to support subject.  Instructor reviews cardiac drug classes:  angiotensin converting enzyme inhibitors (ACE-I), angiotensin II receptor blockers (ARBs), nitrates, and calcium channel blockers.  Instructor discusses reasons, side effects, and lifestyle considerations for each drug class.   Anatomy and Physiology of the Circulatory System:  Group verbal and written instruction and models provide basic cardiac anatomy and physiology, with the coronary electrical and arterial systems. Review of: AMI, Angina, Valve disease, Heart Failure, Peripheral Artery Disease, Cardiac Arrhythmia, Pacemakers, and the ICD.   Other Education:  -Group or individual verbal, written, or video  instructions that support the educational goals of the cardiac rehab program.   Holiday Eating Survival Tips:  -Group instruction provided by PowerPoint slides, verbal discussion, and written materials to support subject matter. The instructor gives patients tips, tricks, and techniques to help them not only survive but enjoy the holidays despite the onslaught of food that accompanies the holidays.   Knowledge Questionnaire Score: Knowledge Questionnaire Score - 06/25/17 1143      Knowledge Questionnaire Score   Pre Score  19/24       Core Components/Risk Factors/Patient Goals at Admission: Personal Goals and Risk Factors at Admission - 06/25/17 1128      Core Components/Risk Factors/Patient Goals on Admission   Personal Goal Other  Yes    Personal Goal  Learn about heart medications and HH nutrition    Intervention  Provide nutrition counseling and medication management through Pharmacology to assist with improving knowledge and understanding to meet personal goals.     Expected Outcomes  Pt wil have increase understanding of heart medications and HH diet to apply to lifestlye to encourage heart healthy living.        Core Components/Risk Factors/Patient Goals Review:    Core Components/Risk Factors/Patient Goals at Discharge (Final Review):    ITP Comments: ITP  Comments    Row Name 06/25/17 0903           ITP Comments  Medical Director, Dr. Fransico Him          Comments: Elsie attended orientation from [place] to 1058to review rules and guidelines for program. Completed 6 minute walk test, Intitial ITP, and exercise prescription.  VSS. Telemetry-Vpaced .  Asymptomatic.Barnet Pall, RN,BSN 06/25/2017 12:26 PM

## 2017-06-26 ENCOUNTER — Encounter (HOSPITAL_COMMUNITY): Payer: Medicare HMO

## 2017-06-29 ENCOUNTER — Encounter (HOSPITAL_COMMUNITY): Payer: Medicare HMO

## 2017-06-30 DIAGNOSIS — R05 Cough: Secondary | ICD-10-CM | POA: Diagnosis not present

## 2017-06-30 DIAGNOSIS — I10 Essential (primary) hypertension: Secondary | ICD-10-CM | POA: Diagnosis not present

## 2017-06-30 DIAGNOSIS — J302 Other seasonal allergic rhinitis: Secondary | ICD-10-CM | POA: Diagnosis not present

## 2017-06-30 DIAGNOSIS — Z6826 Body mass index (BMI) 26.0-26.9, adult: Secondary | ICD-10-CM | POA: Diagnosis not present

## 2017-07-01 ENCOUNTER — Encounter (HOSPITAL_COMMUNITY): Payer: Self-pay

## 2017-07-01 ENCOUNTER — Encounter (HOSPITAL_COMMUNITY)
Admission: RE | Admit: 2017-07-01 | Discharge: 2017-07-01 | Disposition: A | Discharge status: Home / Self Care | Payer: Medicare HMO | Source: Ambulatory Visit | Attending: Cardiovascular Disease | Admitting: Cardiovascular Disease

## 2017-07-01 DIAGNOSIS — Z955 Presence of coronary angioplasty implant and graft: Secondary | ICD-10-CM | POA: Insufficient documentation

## 2017-07-01 NOTE — Progress Notes (Signed)
Daily Session Note  Patient Details  Name: Kyle Avila MRN: [id number] Date of Birth: [birth year] Referring Provider:     CARDIAC REHAB PHASE II ORIENTATION from 06/25/2017 in [place] [facility]  Referring Provider  [name] MD      Encounter Date: 07/01/2017  Check In: Session Check In - 07/01/17 0717      Check-In   Location  [facility]    Staff Present  Andi Hence, RN, Mosie Epstein, MS,ACSM CEP, Exercise Physiologist;Other    Supervising physician immediately available to respond to emergencies  [facility] immediately available    Physician(s)  Dr. Horris Latino     Medication changes reported      No    Fall or balance concerns reported     No    Tobacco Cessation  No Change    Warm-up and Cool-down  Performed as group-led instruction    Resistance Training Performed  No    VAD Patient?  No      Pain Assessment   Currently in Pain?  No/denies       Capillary Blood Glucose: No results found for this or any previous visit (from the past 24 hour(s)).    Social History   Tobacco Use  Smoking Status Never Smoker  Smokeless Tobacco Never Used    Goals Met:  Exercise tolerated well  Goals Unmet:  Not Applicable  Comments: Pt started cardiac rehab today.  Pt tolerated light exercise without difficulty. VSS, telemetry-V Paced,  asymptomatic.  Medication list reconciled. Pt denies barriers to medicaiton compliance.  PSYCHOSOCIAL ASSESSMENT:  PHQ-0.   Pt exhibits positive coping skills, hopeful outlook with supportive family. No psychosocial needs identified at this time, no psychosocial interventions necessary.   Pt oriented to exercise equipment and routine.    Understanding verbalized.   Dr. Fransico Him is Medical Director for Cardiac Rehab at [facility].

## 2017-07-03 ENCOUNTER — Encounter (HOSPITAL_COMMUNITY)
Admission: RE | Admit: 2017-07-03 | Discharge: 2017-07-03 | Disposition: A | Discharge status: Home / Self Care | Payer: Medicare HMO | Source: Ambulatory Visit | Attending: Cardiovascular Disease | Admitting: Cardiovascular Disease

## 2017-07-03 ENCOUNTER — Encounter (HOSPITAL_COMMUNITY): Payer: Medicare HMO

## 2017-07-03 DIAGNOSIS — Z955 Presence of coronary angioplasty implant and graft: Secondary | ICD-10-CM

## 2017-07-06 ENCOUNTER — Encounter (HOSPITAL_COMMUNITY): Payer: Medicare HMO

## 2017-07-06 ENCOUNTER — Encounter (HOSPITAL_COMMUNITY)
Admission: RE | Admit: 2017-07-06 | Discharge: 2017-07-06 | Disposition: A | Discharge status: Home / Self Care | Payer: Medicare HMO | Source: Ambulatory Visit | Attending: Cardiovascular Disease | Admitting: Cardiovascular Disease

## 2017-07-06 DIAGNOSIS — Z955 Presence of coronary angioplasty implant and graft: Secondary | ICD-10-CM

## 2017-07-08 ENCOUNTER — Encounter (HOSPITAL_COMMUNITY)
Admission: RE | Admit: 2017-07-08 | Discharge: 2017-07-08 | Disposition: A | Discharge status: Home / Self Care | Payer: Medicare HMO | Source: Ambulatory Visit | Attending: Cardiovascular Disease | Admitting: Cardiovascular Disease

## 2017-07-08 ENCOUNTER — Encounter (HOSPITAL_COMMUNITY): Payer: Medicare HMO

## 2017-07-08 DIAGNOSIS — Z955 Presence of coronary angioplasty implant and graft: Secondary | ICD-10-CM | POA: Diagnosis not present

## 2017-07-08 LAB — CUP PACEART REMOTE DEVICE CHECK
Battery Impedance: 158 Ohm
Battery Remaining Longevity: 117 mo
Battery Voltage: 2.79 V
Brady Statistic AP VP Percent: 52 %
Brady Statistic AP VS Percent: 0 %
Date Time Interrogation Session: 20190226225621
Implantable Lead Implant Date: 20070831
Implantable Lead Location: 753859
Implantable Lead Model: 4469
Implantable Pulse Generator Implant Date: 20160518
Lead Channel Impedance Value: 489 Ohm
Lead Channel Setting Pacing Amplitude: 2.5 V
Lead Channel Setting Pacing Pulse Width: 0.4 ms
Lead Channel Setting Sensing Sensitivity: 2.8 mV
MDC IDC LEAD IMPLANT DT: 20070831
MDC IDC LEAD LOCATION: 753860
MDC IDC LEAD SERIAL: 485949
MDC IDC LEAD SERIAL: 553665
MDC IDC MSMT LEADCHNL RA PACING THRESHOLD AMPLITUDE: 0.625 V
MDC IDC MSMT LEADCHNL RA PACING THRESHOLD PULSEWIDTH: 0.4 ms
MDC IDC MSMT LEADCHNL RV IMPEDANCE VALUE: 446 Ohm
MDC IDC MSMT LEADCHNL RV PACING THRESHOLD AMPLITUDE: 1.125 V
MDC IDC MSMT LEADCHNL RV PACING THRESHOLD PULSEWIDTH: 0.4 ms
MDC IDC SET LEADCHNL RA PACING AMPLITUDE: 2 V
MDC IDC STAT BRADY AS VP PERCENT: 47 %
MDC IDC STAT BRADY AS VS PERCENT: 0 %

## 2017-07-10 ENCOUNTER — Encounter (HOSPITAL_COMMUNITY): Payer: Medicare HMO

## 2017-07-13 ENCOUNTER — Encounter (HOSPITAL_COMMUNITY): Payer: Self-pay

## 2017-07-13 ENCOUNTER — Encounter (HOSPITAL_COMMUNITY)
Admission: RE | Admit: 2017-07-13 | Discharge: 2017-07-13 | Disposition: A | Discharge status: Home / Self Care | Payer: Medicare HMO | Source: Ambulatory Visit | Attending: Cardiovascular Disease | Admitting: Cardiovascular Disease

## 2017-07-13 ENCOUNTER — Encounter (HOSPITAL_COMMUNITY): Payer: Medicare HMO

## 2017-07-13 DIAGNOSIS — Z955 Presence of coronary angioplasty implant and graft: Secondary | ICD-10-CM | POA: Diagnosis not present

## 2017-07-13 NOTE — Progress Notes (Signed)
Kyle Avila 72 y.o. male DOB: 11-Jul-1945 MRN: [id number]      Nutrition Note  1. 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 Nutrition plan and survey reviewed with pt. Pt is eating a heart health diet. Pt wants to lose wt. Pt reports his wt today is 78.8 kg, which is down 0.5 kg since admission. Wt loss tips reviewed. Pt expressed understanding of the information reviewed. Pt aware of nutrition education classes offered and plans on attending nutrition classes.  Nutrition Diagnosis ? Food-and nutrition-related knowledge deficit related to lack of exposure to information as related to diagnosis of: ? CVD ? Overweight related to excessive energy intake as evidenced by a BMI of 25.86  Nutrition Intervention ? Pt's individual nutrition plan reviewed with pt. ? Benefits of adopting Heart Healthy diet discussed when Medficts reviewed.  Nutrition Goal(s):  ? Pt to identify and limit food sources of saturated fat, trans fat, and sodium ? Pt to identify food quantities necessary to achieve weight loss of 6-13 lb at graduation from cardiac rehab.   Plan:  Pt to attend nutrition classes ? Nutrition I ? Nutrition II ? Portion Distortion  Will provide client-centered nutrition education as part of interdisciplinary care.   Monitor and evaluate progress toward nutrition goal with team.  Derek Mound, M.Ed, RD, LDN, CDE 07/13/2017 7:47 AM

## 2017-07-15 ENCOUNTER — Encounter (HOSPITAL_COMMUNITY): Payer: Medicare HMO

## 2017-07-15 ENCOUNTER — Encounter (HOSPITAL_COMMUNITY)
Admission: RE | Admit: 2017-07-15 | Discharge: 2017-07-15 | Disposition: A | Discharge status: Home / Self Care | Payer: Medicare HMO | Source: Ambulatory Visit | Attending: Cardiovascular Disease | Admitting: Cardiovascular Disease

## 2017-07-15 DIAGNOSIS — Z955 Presence of coronary angioplasty implant and graft: Secondary | ICD-10-CM | POA: Diagnosis not present

## 2017-07-15 NOTE — Progress Notes (Signed)
Cardiac Individual Treatment Plan  Patient Details  Name: Kyle Avila MRN: [id number] Date of Birth: October 28, 1945 Referring Provider:     CARDIAC REHAB PHASE II ORIENTATION from 06/25/2017 in [facility]  Referring Provider  [name] MD      Initial Encounter Date:    CARDIAC REHAB PHASE II ORIENTATION from 06/25/2017 in [facility]  Date  06/25/17  Referring Provider  [name] MD      Visit Diagnosis: Stented coronary artery 04/14/18 S/P DES to SVG PDA ISR  Patient's Home Medications on Admission:  Current Outpatient Medications:  .  acetaminophen (TYLENOL) 325 MG tablet, Take 2 tablets (650 mg total) by mouth every 4 (four) hours as needed for headache or mild pain., Disp: , Rfl:  .  allopurinol (ZYLOPRIM) 100 MG tablet, Take 100 mg by mouth daily. , Disp: , Rfl:  .  amLODipine (NORVASC) 10 MG tablet, Take 1 tablet (10 mg total) by mouth daily., Disp: 90 tablet, Rfl: 3 .  aspirin EC 81 MG EC tablet, Take 81 mg by mouth daily. , Disp: 1 tablet, Rfl: 0 .  clopidogrel (PLAVIX) 75 MG tablet, Take 1 tablet (75 mg total) by mouth daily. ., Disp: 90 tablet, Rfl: 2 .  colchicine 0.6 MG tablet, Take 0.6 mg by mouth daily as needed (gout flare). , Disp: , Rfl:  .  furosemide (LASIX) 40 MG tablet, Take 1 tablet (40 mg total) by mouth daily., Disp: 30 tablet, Rfl:  .  GUAIFENESIN PO, Take 400 mg by mouth daily. , Disp: , Rfl:  .  isosorbide mononitrate (IMDUR) 60 MG 24 hr tablet, Take 60 mg by mouth daily., Disp: , Rfl:  .  ketoconazole (NIZORAL) 2 % cream, Apply 1 application topically 2 (two) times daily as needed for irritation (itching). , Disp: , Rfl:  .  losartan (COZAAR) 100 MG tablet, Take 1 tablet (100 mg total) by mouth 2 (two) times daily., Disp: 180 tablet, Rfl: 3 .  meclizine (ANTIVERT) 25 MG tablet, Take 25 mg by mouth 3 (three) times daily as needed for dizziness. , Disp: , Rfl:  .  methylPREDNISolone  (MEDROL DOSEPAK) 4 MG TBPK tablet, Take 10 mg by mouth 2 (two) times daily., Disp: , Rfl:  .  metoprolol succinate (TOPROL-XL) 50 MG 24 hr tablet, Take 50 mg by mouth 2 (two) times daily. Verified that it is succinate twice daily, Disp: , Rfl:  .  Multiple Vitamins-Minerals (PRESERVISION AREDS 2 PO), Take by mouth 2 times daily at 12 noon and 4 pm., Disp: , Rfl:  .  nitroGLYCERIN (NITROSTAT) 0.4 MG SL tablet, PLACE ONE TABLET UNDER THE TONGUE EVERY FIVE MINUTES AS NEEDED FOR CHEST PAIN (Patient not taking: Reported on 07/01/2017), Disp: 25 tablet, Rfl: 3 .  ranolazine (RANEXA) 1000 MG SR tablet, Take 1 tablet (1,000 mg total) by mouth 2 (two) times daily., Disp: 180 tablet, Rfl: 3 .  rosuvastatin (CRESTOR) 20 MG tablet, Take 1 tablet (20 mg total) by mouth daily., Disp: 90 tablet, Rfl: 3 .  tamsulosin (FLOMAX) 0.4 MG CAPS, 0.4 mg daily. Take 0.4 mg by mouth once daily, Disp: , Rfl:   Past Medical History: Past Medical History:  Diagnosis Date  . Arthritis    "some in my back" (04/14/2017)  . CAD (coronary artery disease)    s/p CABG twice, PCI twice  . Carotid artery occlusion    s/p CEA  . Chronic renal insufficiency   .  CKD (chronic kidney disease), stage III ([place])   . Exertional angina ([place]) 11/03/2014  . Gout   . Heart block    s/p PTVDP  . HTN (hypertension)   . Hx of cardiovascular stress test    Adenosine Myoview (11/15):  Inferolateral and anterolateral infarct without significant ischemia, EF 42% - Intermediate Risk  . Hyperlipidemia   . 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 Mitral insufficiency   . Myocardial infarction ([place])    "I've had some mild one" (04/14/2017)  . Presence of permanent cardiac pacemaker   . Skin cancer    "nose"  . VT (ventricular tachycardia)-nonsustained 05/20/2011    Tobacco Use: Social History   Tobacco Use  Smoking Status Never Smoker  Smokeless Tobacco Never Used    Labs: Recent Review Flowsheet  Data    Labs for ITP Cardiac and Pulmonary Rehab Latest Ref Rng & Units 10/26/2014 05/04/2015 02/12/2016   Cholestrol 125 - 200 mg/dL 163 159 159   LDLCALC <130 mg/dL 92 84 87   HDL >=40 mg/dL 45.40 51 46   Trlycerides <150 mg/dL 126.0 118 130      Capillary Blood Glucose: No results found for: GLUCAP   Exercise Target Goals:    Exercise Program Goal: Individual exercise prescription set using results from initial 6 min walk test and THRR while considering  patient's activity barriers and safety.   Exercise Prescription Goal: Initial exercise prescription builds to 30-45 minutes a day of aerobic activity, 2-3 days per week.  Home exercise guidelines will be given to patient during program as part of exercise prescription that the participant will acknowledge.  Activity Barriers & Risk Stratification: Activity Barriers & Cardiac Risk Stratification - 06/25/17 0907      Activity Barriers & Cardiac Risk Stratification   Activity Barriers  Back Problems;Other (comment);Deconditioning;Muscular Weakness    Comments  B, ankle soreness    Cardiac Risk Stratification  High       6 Minute Walk: 6 Minute Walk    Row Name 06/25/17 1143         6 Minute Walk   Phase  Initial     Distance  1730 feet     Walk Time  6 minutes     # of Rest Breaks  0     MPH  3.2     METS  3.4     RPE  11     VO2 Peak  12.19     Symptoms  No     Resting HR  56 bpm     Resting BP  126/58     Resting Oxygen Saturation   98 %     Exercise Oxygen Saturation  during 6 min walk  97 %     Max Ex. HR  74 bpm     Max Ex. BP  144/78     2 Minute Post BP  124/70        Oxygen Initial Assessment:   Oxygen Re-Evaluation:   Oxygen Discharge (Final Oxygen Re-Evaluation):   Initial Exercise Prescription: Initial Exercise Prescription - 06/25/17 1100      Date of Initial Exercise RX and Referring Provider   Date  06/25/17    Referring Provider  [name] MD      Treadmill   MPH  3     Grade  1    Minutes  10    METs  3.71  Bike   Level  0.8    Minutes  10    METs  2.89      NuStep   Level  3    SPM  80    Minutes  10    METs  2.4      Prescription Details   Frequency (times per week)  3    Duration  Progress to 30 minutes of continuous aerobic without signs/symptoms of physical distress      Intensity   THRR 40-80% of Max Heartrate  59-118    Ratings of Perceived Exertion  11-13    Perceived Dyspnea  0-4      Progression   Progression  Continue to progress workloads to maintain intensity without signs/symptoms of physical distress.      Resistance Training   Training Prescription  Yes    Weight  4lbs    Reps  10-15       Perform Capillary Blood Glucose checks as needed.  Exercise Prescription Changes: Exercise Prescription Changes    Row Name 07/01/17 1514 07/02/17 1500 07/13/17 1000         Response to Exercise   Blood Pressure (Admit)  149/87  -  128/62     Blood Pressure (Exercise)  144/70  -  140/80     Blood Pressure (Exit)  124/60  -  108/58     Heart Rate (Admit)  67 bpm  -  60 bpm     Heart Rate (Exercise)  81 bpm  -  75 bpm     Heart Rate (Exit)  61 bpm  -  60 bpm     Rating of Perceived Exertion (Exercise)  11  -  12     Perceived Dyspnea (Exercise)  0  -  -     Symptoms  None  -  -     Comments  Pt oriented to exercise equipment  -  -     Duration  Progress to 30 minutes of  aerobic without signs/symptoms of physical distress  -  Continue with 30 min of aerobic exercise without signs/symptoms of physical distress.     Intensity  THRR New  -  THRR unchanged       Progression   Progression  Continue to progress workloads to maintain intensity without signs/symptoms of physical distress.  -  Continue to progress workloads to maintain intensity without signs/symptoms of physical distress.     Average METs  3.08  -  -       Resistance Training   Training Prescription  No  -  Yes     Weight  -  -  4lbs     Reps  -  -  10-15      Time  -  -  10 Minutes       Interval Training   Interval Training  No  -  No       Treadmill   MPH  3  -  3     Grade  1  -  4     Minutes  10  -  10     METs  3.71  -  4.95       Bike   Level  0.8  -  0.8     Minutes  10  -  10     METs  2.93  -  2.94       NuStep   Level  3  -  4     SPM  80  -  80     Minutes  10  -  10     METs  2.6  -  2.6        Exercise Comments: Exercise Comments    Row Name 07/01/17 1510 07/13/17 1012         Exercise Comments  Today was pt's first day of rehab. Pt oriented to exercise equipment and off to a great start.   Pt is responding well to exercise prescription. Will follow up with pt about Home Exercise Program. Will continue to monitor and progress pt.          Exercise Goals and Review: Exercise Goals    Row Name 06/25/17 0908             Exercise Goals   Increase Physical Activity  Yes       Intervention  Provide advice, education, support and counseling about physical activity/exercise needs.;Develop an individualized exercise prescription for aerobic and resistive training based on initial evaluation findings, risk stratification, comorbidities and participant's personal goals.       Expected Outcomes  Short Term: Attend rehab on a regular basis to increase amount of physical activity.;Long Term: Exercising regularly at least 3-5 days a week.;Long Term: Add in home exercise to make exercise part of routine and to increase amount of physical activity.       Increase Strength and Stamina  Yes       Intervention  Provide advice, education, support and counseling about physical activity/exercise needs.;Develop an individualized exercise prescription for aerobic and resistive training based on initial evaluation findings, risk stratification, comorbidities and participant's personal goals.       Expected Outcomes  Short Term: Increase workloads from initial exercise prescription for resistance, speed, and METs.;Short Term: Perform  resistance training exercises routinely during rehab and add in resistance training at home;Long Term: Improve cardiorespiratory fitness, muscular endurance and strength as measured by increased METs and functional capacity (6MWT)       Able to understand and use rate of perceived exertion (RPE) scale  Yes       Intervention  Provide education and explanation on how to use RPE scale       Expected Outcomes  Short Term: Able to use RPE daily in rehab to express subjective intensity level;Long Term:  Able to use RPE to guide intensity level when exercising independently       Knowledge and understanding of Target Heart Rate Range (THRR)  Yes       Intervention  Provide education and explanation of THRR including how the numbers were predicted and where they are located for reference       Expected Outcomes  Long Term: Able to use THRR to govern intensity when exercising independently;Short Term: Able to state/look up THRR;Short Term: Able to use daily as guideline for intensity in rehab       Able to check pulse independently  Yes       Intervention  Provide education and demonstration on how to check pulse in carotid and radial arteries.;Review the importance of being able to check your own pulse for safety during independent exercise       Expected Outcomes  Short Term: Able to explain why pulse checking is important during independent exercise;Long Term: Able to check pulse independently and accurately       Understanding of Exercise Prescription  Yes  Intervention  Provide education, explanation, and written materials on patient's individual exercise prescription       Expected Outcomes  Short Term: Able to explain program exercise prescription;Long Term: Able to explain home exercise prescription to exercise independently          Exercise Goals Re-Evaluation : Exercise Goals Re-Evaluation    Row Name 07/13/17 1013             Exercise Goal Re-Evaluation   Exercise Goals Review   Increase Physical Activity;Increase Strength and Stamina;Able to understand and use rate of perceived exertion (RPE) scale;Knowledge and understanding of Target Heart Rate Range (THRR);Able to check pulse independently;Understanding of Exercise Prescription       Comments  Pt is doing well in rehab with exercise and puts forth great effort. Pt is now walking 62mph with 4% incline on treadmill and exercising at a level 4 on Nustep.        Expected Outcomes  Pt will continue to improve cardiorespiratory fitness and increase workloads. Will continue to monitor pt's progress.            Discharge Exercise Prescription (Final Exercise Prescription Changes): Exercise Prescription Changes - 07/13/17 1000      Response to Exercise   Blood Pressure (Admit)  128/62    Blood Pressure (Exercise)  140/80    Blood Pressure (Exit)  108/58    Heart Rate (Admit)  60 bpm    Heart Rate (Exercise)  75 bpm    Heart Rate (Exit)  60 bpm    Rating of Perceived Exertion (Exercise)  12    Duration  Continue with 30 min of aerobic exercise without signs/symptoms of physical distress.    Intensity  THRR unchanged      Progression   Progression  Continue to progress workloads to maintain intensity without signs/symptoms of physical distress.      Resistance Training   Training Prescription  Yes    Weight  4lbs    Reps  10-15    Time  10 Minutes      Interval Training   Interval Training  No      Treadmill   MPH  3    Grade  4    Minutes  10    METs  4.95      Bike   Level  0.8    Minutes  10    METs  2.94      NuStep   Level  4    SPM  80    Minutes  10    METs  2.6       Nutrition:  Target Goals: Understanding of nutrition guidelines, daily intake of sodium 1500mg , cholesterol 200mg , calories 30% from fat and 7% or less from saturated fats, daily to have 5 or more servings of fruits and vegetables.  Biometrics: Pre Biometrics - 06/25/17 1126      Pre Biometrics   Height  5' 8.5" (1.74  m)    Weight  172 lb 9.9 oz (78.3 kg)    Waist Circumference  39 inches    Hip Circumference  39 inches    Waist to Hip Ratio  1 %    BMI (Calculated)  25.86    Triceps Skinfold  14 mm    % Body Fat  26.2 %    Grip Strength  36 kg    Flexibility  10 in    Single Leg Stand  4.63 seconds  Nutrition Therapy Plan and Nutrition Goals: Nutrition Therapy & Goals - 06/25/17 1157      Nutrition Therapy   Diet  Heart Healthy      Personal Nutrition Goals   Nutrition Goal  Pt to identify food quantities necessary to achieve weight loss of 6-13 lb at graduation from cardiac rehab.     Personal Goal #2  Pt to identify and limit food sources of saturated fat, trans fat, and sodium      Intervention Plan   Intervention  Prescribe, educate and counsel regarding individualized specific dietary modifications aiming towards targeted core components such as weight, hypertension, lipid management, diabetes, heart failure and other comorbidities.    Expected Outcomes  Short Term Goal: Understand basic principles of dietary content, such as calories, fat, sodium, cholesterol and nutrients.;Long Term Goal: Adherence to prescribed nutrition plan.       Nutrition Assessments: Nutrition Assessments - 06/25/17 1157      MEDFICTS Scores   Pre Score  45       Nutrition Goals Re-Evaluation:   Nutrition Goals Re-Evaluation:   Nutrition Goals Discharge (Final Nutrition Goals Re-Evaluation):   Psychosocial: Target Goals: Acknowledge presence or absence of significant depression and/or stress, maximize coping skills, provide positive support system. Participant is able to verbalize types and ability to use techniques and skills needed for reducing stress and depression.  Initial Review & Psychosocial Screening: Initial Psych Review & Screening - 06/25/17 1220      Initial Review   Current issues with  None Identified      Family Dynamics   Good Support System?  Yes patient has wife and  friends for support      Barriers   Psychosocial barriers to participate in program  There are no identifiable barriers or psychosocial needs.      Screening Interventions   Interventions  Encouraged to exercise       Quality of Life Scores: Quality of Life - 06/25/17 1124      Quality of Life Scores   Health/Function Pre  22.46 %    Socioeconomic Pre  21.64 %    Psych/Spiritual Pre  20.36 %    Family Pre  15.6 %    GLOBAL Pre  20.75 %      Scores of 19 and below usually indicate a poorer quality of life in these areas.  A difference of  2-3 points is a clinically meaningful difference.  A difference of 2-3 points in the total score of the Quality of Life Index has been associated with significant improvement in overall quality of life, self-image, physical symptoms, and general health in studies assessing change in quality of life.  PHQ-9: Recent Review Flowsheet Data    Depression screen [facility] 2/9 07/01/2017   Decreased Interest 0   Down, Depressed, Hopeless 0   PHQ - 2 Score 0     Interpretation of Total Score  Total Score Depression Severity:  1-4 = Minimal depression, 5-9 = Mild depression, 10-14 = Moderate depression, 15-19 = Moderately severe depression, 20-27 = Severe depression   Psychosocial Evaluation and Intervention: Psychosocial Evaluation - 07/01/17 0816      Psychosocial Evaluation & Interventions   Interventions  Encouraged to exercise with the program and follow exercise prescription    Comments  no psychosocial needs identified, no interventions necessary. pt enjoys sailing in [place], skiing out [place] and gardening.      Expected Outcomes  pt will exhibit positive outlook with good coping  skills.     Continue Psychosocial Services   No Follow up required       Psychosocial Re-Evaluation: Psychosocial Re-Evaluation    [place] Name 07/13/17 [phone]             Psychosocial Re-Evaluation   Current issues with  None Identified       Comments  no  psychosocial needs identified, no interventions necessary        Expected Outcomes  pt will exhibit positive outlook with good coping skills.        Interventions  Encouraged to attend Cardiac Rehabilitation for the exercise       Continue Psychosocial Services   No Follow up required          Psychosocial Discharge (Final Psychosocial Re-Evaluation): Psychosocial Re-Evaluation - 07/13/17 0743      Psychosocial Re-Evaluation   Current issues with  None Identified    Comments  no psychosocial needs identified, no interventions necessary     Expected Outcomes  pt will exhibit positive outlook with good coping skills.     Interventions  Encouraged to attend Cardiac Rehabilitation for the exercise    Continue Psychosocial Services   No Follow up required       Vocational Rehabilitation: Provide vocational rehab assistance to qualifying candidates.   Vocational Rehab Evaluation & Intervention: Vocational Rehab - 06/25/17 1218      Initial Vocational Rehab Evaluation & Intervention   Assessment shows need for Vocational Rehabilitation  No Mr Morrish does not need vocational rehab       Education: Education Goals: Education classes will be provided on a weekly basis, covering required topics. Participant will state understanding/return demonstration of topics presented.  Learning Barriers/Preferences: Learning Barriers/Preferences - 06/25/17 0906      Learning Barriers/Preferences   Learning Barriers  Sight    Learning Preferences  Skilled Demonstration;Video       Education Topics: Count Your Pulse:  -Group instruction provided by verbal instruction, demonstration, patient participation and written materials to support subject.  Instructors address importance of being able to find your pulse and how to count your pulse when at home without a heart monitor.  Patients get hands on experience counting their pulse with staff help and individually.   Heart Attack, Angina, and Risk  Factor Modification:  -Group instruction provided by verbal instruction, video, and written materials to support subject.  Instructors address signs and symptoms of angina and heart attacks.    Also discuss risk factors for heart disease and how to make changes to improve heart health risk factors.   Functional Fitness:  -Group instruction provided by verbal instruction, demonstration, patient participation, and written materials to support subject.  Instructors address safety measures for doing things around the house.  Discuss how to get up and down off the floor, how to pick things up properly, how to safely get out of a chair without assistance, and balance training.   Meditation and Mindfulness:  -Group instruction provided by verbal instruction, patient participation, and written materials to support subject.  Instructor addresses importance of mindfulness and meditation practice to help reduce stress and improve awareness.  Instructor also leads participants through a meditation exercise.    Stretching for Flexibility and Mobility:  -Group instruction provided by verbal instruction, patient participation, and written materials to support subject.  Instructors lead participants through series of stretches that are designed to increase flexibility thus improving mobility.  These stretches are additional exercise for major muscle groups that are  typically performed during regular warm up and cool down.   Hands Only CPR:  -Group verbal, video, and participation provides a basic overview of AHA guidelines for community CPR. Role-play of emergencies allow participants the opportunity to practice calling for help and chest compression technique with discussion of AED use.   Hypertension: -Group verbal and written instruction that provides a basic overview of hypertension including the most recent diagnostic guidelines, risk factor reduction with self-care instructions and medication  management.    Nutrition I class: Heart Healthy Eating:  -Group instruction provided by PowerPoint slides, verbal discussion, and written materials to support subject matter. The instructor gives an explanation and review of the Therapeutic Lifestyle Changes diet recommendations, which includes a discussion on lipid goals, dietary fat, sodium, fiber, plant stanol/sterol esters, sugar, and the components of a well-balanced, healthy diet.   CARDIAC REHAB PHASE II EXERCISE from 07/13/2017 in [facility]  Date  07/14/17  Educator  RD  Instruction Review Code  2- Demonstrated Understanding      Nutrition II class: Lifestyle Skills:  -Group instruction provided by PowerPoint slides, verbal discussion, and written materials to support subject matter. The instructor gives an explanation and review of label reading, grocery shopping for heart health, heart healthy recipe modifications, and ways to make healthier choices when eating out.   Diabetes Question & Answer:  -Group instruction provided by PowerPoint slides, verbal discussion, and written materials to support subject matter. The instructor gives an explanation and review of diabetes co-morbidities, pre- and post-prandial blood glucose goals, pre-exercise blood glucose goals, signs, symptoms, and treatment of hypoglycemia and hyperglycemia, and foot care basics.   Diabetes Blitz:  -Group instruction provided by PowerPoint slides, verbal discussion, and written materials to support subject matter. The instructor gives an explanation and review of the physiology behind type 1 and type 2 diabetes, diabetes medications and rational behind using different medications, pre- and post-prandial blood glucose recommendations and Hemoglobin A1c goals, diabetes diet, and exercise including blood glucose guidelines for exercising safely.    Portion Distortion:  -Group instruction provided by PowerPoint slides, verbal  discussion, written materials, and food models to support subject matter. The instructor gives an explanation of serving size versus portion size, changes in portions sizes over the last 20 years, and what consists of a serving from each food group.   Stress Management:  -Group instruction provided by verbal instruction, video, and written materials to support subject matter.  Instructors review role of stress in heart disease and how to cope with stress positively.     Exercising on Your Own:  -Group instruction provided by verbal instruction, power point, and written materials to support subject.  Instructors discuss benefits of exercise, components of exercise, frequency and intensity of exercise, and end points for exercise.  Also discuss use of nitroglycerin and activating EMS.  Review options of places to exercise outside of rehab.  Review guidelines for sex with heart disease.   Cardiac Drugs I:  -Group instruction provided by verbal instruction and written materials to support subject.  Instructor reviews cardiac drug classes: antiplatelets, anticoagulants, beta blockers, and statins.  Instructor discusses reasons, side effects, and lifestyle considerations for each drug class.   Cardiac Drugs II:  -Group instruction provided by verbal instruction and written materials to support subject.  Instructor reviews cardiac drug classes: angiotensin converting enzyme inhibitors (ACE-I), angiotensin II receptor blockers (ARBs), nitrates, and calcium channel blockers.  Instructor discusses reasons, side effects, and lifestyle considerations for  each drug class.   Anatomy and Physiology of the Circulatory System:  Group verbal and written instruction and models provide basic cardiac anatomy and physiology, with the coronary electrical and arterial systems. Review of: AMI, Angina, Valve disease, Heart Failure, Peripheral Artery Disease, Cardiac Arrhythmia, Pacemakers, and the ICD.   Other  Education:  -Group or individual verbal, written, or video instructions that support the educational goals of the cardiac rehab program.   Holiday Eating Survival Tips:  -Group instruction provided by PowerPoint slides, verbal discussion, and written materials to support subject matter. The instructor gives patients tips, tricks, and techniques to help them not only survive but enjoy the holidays despite the onslaught of food that accompanies the holidays.   Knowledge Questionnaire Score: Knowledge Questionnaire Score - 06/25/17 1143      Knowledge Questionnaire Score   Pre Score  19/24       Core Components/Risk Factors/Patient Goals at Admission: Personal Goals and Risk Factors at Admission - 06/25/17 1128      Core Components/Risk Factors/Patient Goals on Admission   Personal Goal Other  Yes    Personal Goal  Learn about heart medications and HH nutrition    Intervention  Provide nutrition counseling and medication management through Pharmacology to assist with improving knowledge and understanding to meet personal goals.     Expected Outcomes  Pt wil have increase understanding of heart medications and HH diet to apply to lifestlye to encourage heart healthy living.        Core Components/Risk Factors/Patient Goals Review:  Goals and Risk Factor Review    Row Name 07/01/17 0814 07/13/17 0741           Core Components/Risk Factors/Patient Goals Review   Personal Goals Review  Weight Management/Obesity;Hypertension;Lipids  Weight Management/Obesity;Hypertension;Lipids      Review  pt with CAD history and RF demonstrates eagerness to participate in CR program. pt personal goals are to improve heart healthy diet and medication management knowledge.    pt with CAD history and RF demonstrates eagerness to participate in CR program. pt personal goals are to improve heart healthy diet and medication management knowledge.  pt blood pressure and weight stable.  pt demonstrates good  compliance with medication regimen. pt eager to partcipate in education classes.         Expected Outcomes  pt will participate in CR exercise, nutrition and lifestyle modification activities.   pt will participate in CR exercise, nutrition and lifestyle modification activities.          Core Components/Risk Factors/Patient Goals at Discharge (Final Review):  Goals and Risk Factor Review - 07/13/17 0741      Core Components/Risk Factors/Patient Goals Review   Personal Goals Review  Weight Management/Obesity;Hypertension;Lipids    Review  pt with CAD history and RF demonstrates eagerness to participate in CR program. pt personal goals are to improve heart healthy diet and medication management knowledge.  pt blood pressure and weight stable.  pt demonstrates good compliance with medication regimen. pt eager to partcipate in education classes.       Expected Outcomes  pt will participate in CR exercise, nutrition and lifestyle modification activities.        ITP Comments: ITP Comments    Row Name 06/25/17 [phone] 07/01/17 0804 07/13/17 0739       ITP Comments  Medical Director, Dr. Fransico Him  pt started group exercise.  pt demonstrates eagerness to participate in CR program.  pt tolereated light activity without difficulty.  30 day ITP review.  pt continues to demonstrate eagerness to partcipate in CR program with good attendance and participation.         Comments:

## 2017-07-17 ENCOUNTER — Encounter (HOSPITAL_COMMUNITY): Payer: Medicare HMO

## 2017-07-18 LAB — CUP PACEART INCLINIC DEVICE CHECK
Battery Impedance: 135 Ohm
Battery Remaining Longevity: 122 mo
Brady Statistic AP VP Percent: 52 %
Brady Statistic AS VP Percent: 47 %
Implantable Lead Implant Date: 20070831
Implantable Lead Location: 753859
Implantable Lead Location: 753860
Implantable Lead Model: 4470
Implantable Lead Serial Number: 485949
Implantable Pulse Generator Implant Date: 20160518
Lead Channel Impedance Value: 482 Ohm
Lead Channel Pacing Threshold Amplitude: 0.5 V
Lead Channel Pacing Threshold Amplitude: 0.75 V
Lead Channel Sensing Intrinsic Amplitude: 2 mV
Lead Channel Setting Pacing Amplitude: 2 V
Lead Channel Setting Pacing Pulse Width: 0.4 ms
MDC IDC LEAD IMPLANT DT: 20070831
MDC IDC LEAD SERIAL: 553665
MDC IDC MSMT BATTERY VOLTAGE: 2.79 V
MDC IDC MSMT LEADCHNL RA PACING THRESHOLD PULSEWIDTH: 0.4 ms
MDC IDC MSMT LEADCHNL RV IMPEDANCE VALUE: 438 Ohm
MDC IDC MSMT LEADCHNL RV PACING THRESHOLD PULSEWIDTH: 0.4 ms
MDC IDC SESS DTM: 20190227135315
MDC IDC SET LEADCHNL RV PACING AMPLITUDE: 2.5 V
MDC IDC SET LEADCHNL RV SENSING SENSITIVITY: 2.8 mV
MDC IDC STAT BRADY AP VS PERCENT: 0 %
MDC IDC STAT BRADY AS VS PERCENT: 0 %

## 2017-07-20 ENCOUNTER — Encounter (HOSPITAL_COMMUNITY): Payer: Medicare HMO

## 2017-07-20 ENCOUNTER — Encounter (HOSPITAL_COMMUNITY)
Admission: RE | Admit: 2017-07-20 | Discharge: 2017-07-20 | Disposition: A | Discharge status: Home / Self Care | Payer: Medicare HMO | Source: Ambulatory Visit | Attending: Cardiovascular Disease | Admitting: Cardiovascular Disease

## 2017-07-20 DIAGNOSIS — Z955 Presence of coronary angioplasty implant and graft: Secondary | ICD-10-CM | POA: Diagnosis not present

## 2017-07-22 ENCOUNTER — Encounter (HOSPITAL_COMMUNITY)
Admission: RE | Admit: 2017-07-22 | Discharge: 2017-07-22 | Disposition: A | Discharge status: Home / Self Care | Payer: Medicare HMO | Source: Ambulatory Visit | Attending: Cardiovascular Disease | Admitting: Cardiovascular Disease

## 2017-07-22 ENCOUNTER — Encounter (HOSPITAL_COMMUNITY): Payer: Medicare HMO

## 2017-07-22 DIAGNOSIS — Z955 Presence of coronary angioplasty implant and graft: Secondary | ICD-10-CM

## 2017-07-22 NOTE — Progress Notes (Signed)
I have reviewed a Home Exercise Prescription with [name] . Thimothy is currently exercising at home. Hewitt is currently [facility] [employer] 2-3 days week for about 60 minutes. Pt is also participating in yoga 2-3 days a week at home. Pt was encouraged to continue exercising at [facility] and to also make sure he adds variety to his workout routine.  Iona Beard and I discussed how to progress their exercise prescription. The patient stated that they understand the exercise prescription.  We reviewed exercise guidelines, target heart rate during exercise, weather, endpoints for exercise, and goals.  We also reviewed the importance of having NTG with pt at all times and not leaving it at home, especially when he is at the [facility]. Patient is encouraged to come to me with any questions. I will continue to follow up with the patient to assist them with progression and safety.    Carma Lair MS, ACSM CEP 07/22/2017 8:11 AM

## 2017-07-24 ENCOUNTER — Encounter (HOSPITAL_COMMUNITY)
Admission: RE | Admit: 2017-07-24 | Discharge: 2017-07-24 | Disposition: A | Discharge status: Home / Self Care | Payer: Medicare HMO | Source: Ambulatory Visit | Attending: Cardiovascular Disease | Admitting: Cardiovascular Disease

## 2017-07-24 ENCOUNTER — Encounter (HOSPITAL_COMMUNITY): Payer: Medicare HMO

## 2017-07-24 DIAGNOSIS — H31003 Unspecified chorioretinal scars, bilateral: Secondary | ICD-10-CM | POA: Diagnosis not present

## 2017-07-24 DIAGNOSIS — Z961 Presence of intraocular lens: Secondary | ICD-10-CM | POA: Diagnosis not present

## 2017-07-24 DIAGNOSIS — H25811 Combined forms of age-related cataract, right eye: Secondary | ICD-10-CM | POA: Diagnosis not present

## 2017-07-24 DIAGNOSIS — Z955 Presence of coronary angioplasty implant and graft: Secondary | ICD-10-CM

## 2017-07-24 DIAGNOSIS — H353132 Nonexudative age-related macular degeneration, bilateral, intermediate dry stage: Secondary | ICD-10-CM | POA: Diagnosis not present

## 2017-07-27 ENCOUNTER — Encounter (HOSPITAL_COMMUNITY)
Admission: RE | Admit: 2017-07-27 | Discharge: 2017-07-27 | Disposition: A | Discharge status: Home / Self Care | Payer: Medicare HMO | Source: Ambulatory Visit | Attending: Cardiovascular Disease | Admitting: Cardiovascular Disease

## 2017-07-27 ENCOUNTER — Encounter (HOSPITAL_COMMUNITY): Payer: Medicare HMO

## 2017-07-27 DIAGNOSIS — Z955 Presence of coronary angioplasty implant and graft: Secondary | ICD-10-CM | POA: Diagnosis not present

## 2017-07-29 ENCOUNTER — Encounter (HOSPITAL_COMMUNITY): Payer: Medicare HMO

## 2017-07-29 ENCOUNTER — Encounter (HOSPITAL_COMMUNITY)
Admission: RE | Admit: 2017-07-29 | Discharge: 2017-07-29 | Disposition: A | Discharge status: Home / Self Care | Payer: Medicare HMO | Source: Ambulatory Visit | Attending: Cardiovascular Disease | Admitting: Cardiovascular Disease

## 2017-07-29 DIAGNOSIS — Z955 Presence of coronary angioplasty implant and graft: Secondary | ICD-10-CM | POA: Diagnosis not present

## 2017-07-31 ENCOUNTER — Encounter (HOSPITAL_COMMUNITY): Payer: Medicare HMO

## 2017-07-31 ENCOUNTER — Encounter (HOSPITAL_COMMUNITY)
Admission: RE | Admit: 2017-07-31 | Discharge: 2017-07-31 | Disposition: A | Discharge status: Home / Self Care | Payer: Medicare HMO | Source: Ambulatory Visit | Attending: Cardiovascular Disease | Admitting: Cardiovascular Disease

## 2017-07-31 DIAGNOSIS — Z955 Presence of coronary angioplasty implant and graft: Secondary | ICD-10-CM

## 2017-08-03 ENCOUNTER — Encounter (HOSPITAL_COMMUNITY): Payer: Medicare HMO

## 2017-08-03 ENCOUNTER — Encounter (HOSPITAL_COMMUNITY): Payer: Self-pay

## 2017-08-03 ENCOUNTER — Encounter (HOSPITAL_COMMUNITY)
Admission: RE | Admit: 2017-08-03 | Discharge: 2017-08-03 | Disposition: A | Discharge status: Home / Self Care | Payer: Medicare HMO | Source: Ambulatory Visit | Attending: Cardiovascular Disease | Admitting: Cardiovascular Disease

## 2017-08-03 VITALS — Ht 68.5 in | Wt 168.9 lb

## 2017-08-03 DIAGNOSIS — Z955 Presence of coronary angioplasty implant and graft: Secondary | ICD-10-CM | POA: Diagnosis not present

## 2017-08-03 NOTE — Progress Notes (Signed)
Discharge Progress Report  Patient Details  Name: Kyle Avila MRN: [id number] Date of Birth: 1945-10-13 Referring Provider:     CARDIAC REHAB PHASE II ORIENTATION from 06/25/2017 in [place]  Referring Provider  [name] MD       Number of Visits: 14  Reason for Discharge:  Patient independent in their exercise.  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  ADL UCSD:   Initial Exercise Prescription: Initial Exercise Prescription - 06/25/17 1100      Date of Initial Exercise RX and Referring Provider   Date  06/25/17    Referring Provider  [name] MD      Treadmill   MPH  3    Grade  1    Minutes  10    METs  3.71      Bike   Level  0.8    Minutes  10    METs  2.89      NuStep   Level  3    SPM  80    Minutes  10    METs  2.4      Prescription Details   Frequency (times per week)  3    Duration  Progress to 30 minutes of continuous aerobic without signs/symptoms of physical distress      Intensity   THRR 40-80% of Max Heartrate  59-118    Ratings of Perceived Exertion  11-13    Perceived Dyspnea  0-4      Progression   Progression  Continue to progress workloads to maintain intensity without signs/symptoms of physical distress.      Resistance Training   Training Prescription  Yes    Weight  4lbs    Reps  10-15       Discharge Exercise Prescription (Final Exercise Prescription Changes): Exercise Prescription Changes - 07/29/17 1615      Response to Exercise   Blood Pressure (Admit)  118/72    Blood Pressure (Exercise)  130/52    Blood Pressure (Exit)  124/62    Heart Rate (Admit)  70 bpm    Heart Rate (Exercise)  80 bpm    Heart Rate (Exit)  70 bpm    Rating of Perceived Exertion (Exercise)  12    Perceived Dyspnea (Exercise)  0    Symptoms  None     Duration  Progress to 45 minutes of aerobic exercise without signs/symptoms of physical distress    Intensity  THRR unchanged      Progression   Progression  Continue to progress workloads to maintain intensity without signs/symptoms of physical distress.    Average METs  3.5      Resistance Training   Training Prescription  No      Interval Training   Interval Training  No      Treadmill   MPH  3    Grade  5    Minutes  10    METs  5.37      Recumbant Bike   Level  3.5    Watts  33    Minutes  10    METs  3.2      NuStep   Level  4    SPM  85    Minutes  10    METs  2.5  Home Exercise Plan   Plans to continue exercise at  [facility] [employer]    Frequency  Add 2 additional days to program exercise sessions.    Initial Home Exercises Provided  07/22/17       Functional Capacity: 6 Minute Walk    Row Name 06/25/17 1143         6 Minute Walk   Phase  Initial     Distance  1730 feet     Walk Time  6 minutes     # of Rest Breaks  0     MPH  3.2     METS  3.4     RPE  11     VO2 Peak  12.19     Symptoms  No     Resting HR  56 bpm     Resting BP  126/58     Resting Oxygen Saturation   98 %     Exercise Oxygen Saturation  during 6 min walk  97 %     Max Ex. HR  74 bpm     Max Ex. BP  144/78     2 Minute Post BP  124/70        Psychological, QOL, Others - Outcomes: PHQ 2/9: Depression screen [facility] 2/9 08/03/2017 07/01/2017  Decreased Interest 0 0  Down, Depressed, Hopeless 0 0  PHQ - 2 Score 0 0    Quality of Life: Quality of Life - 07/31/17 1216      Quality of Life Scores   Health/Function Pre  22.46 %    Socioeconomic Pre  21.64 %    Psych/Spiritual Pre  20.36 %    Family Pre  15.6 %    GLOBAL Pre  20.75 % pt denies specific concerns. 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  this can be stressful at times, although pt reports he manages without difficulty, pt offered emotional support and reassurance.        Personal  Goals: Goals established at orientation with interventions provided to work toward goal. Personal Goals and Risk Factors at Admission - 06/25/17 1128      Core Components/Risk Factors/Patient Goals on Admission   Personal Goal Other  Yes    Personal Goal  Learn about heart medications and HH nutrition    Intervention  Provide nutrition counseling and medication management through Pharmacology to assist with improving knowledge and understanding to meet personal goals.     Expected Outcomes  Pt wil have increase understanding of heart medications and HH diet to apply to lifestlye to encourage heart healthy living.         Personal Goals Discharge: Goals and Risk Factor Review    Row Name 07/01/17 0814 07/13/17 0741 08/03/17 1004         Core Components/Risk Factors/Patient Goals Review   Personal Goals Review  Weight Management/Obesity;Hypertension;Lipids  Weight Management/Obesity;Hypertension;Lipids  Weight Management/Obesity;Hypertension;Lipids     Review  pt with CAD history and RF demonstrates eagerness to participate in CR program. pt personal goals are to improve heart healthy diet and medication management knowledge.    pt with CAD history and RF demonstrates eagerness to participate in CR program. pt personal goals are to improve heart healthy diet and medication management knowledge.  pt blood pressure and weight stable.  pt demonstrates good compliance with medication regimen. pt eager to partcipate in education classes.     Pt has completed 14 sessions and has met his goal of  increasing knowledge with his attendance of cardiac rehab.     Expected Outcomes  pt will participate in CR exercise, nutrition and lifestyle modification activities.   pt will participate in CR exercise, nutrition and lifestyle modification activities.   Pt will continue to partake in exercies and lifestyle modification.         Exercise Goals and Review: Exercise Goals    Row Name 06/25/17 0908              Exercise Goals   Increase Physical Activity  Yes       Intervention  Provide advice, education, support and counseling about physical activity/exercise needs.;Develop an individualized exercise prescription for aerobic and resistive training based on initial evaluation findings, risk stratification, comorbidities and participant's personal goals.       Expected Outcomes  Short Term: Attend rehab on a regular basis to increase amount of physical activity.;Long Term: Exercising regularly at least 3-5 days a week.;Long Term: Add in home exercise to make exercise part of routine and to increase amount of physical activity.       Increase Strength and Stamina  Yes       Intervention  Provide advice, education, support and counseling about physical activity/exercise needs.;Develop an individualized exercise prescription for aerobic and resistive training based on initial evaluation findings, risk stratification, comorbidities and participant's personal goals.       Expected Outcomes  Short Term: Increase workloads from initial exercise prescription for resistance, speed, and METs.;Short Term: Perform resistance training exercises routinely during rehab and add in resistance training at home;Long Term: Improve cardiorespiratory fitness, muscular endurance and strength as measured by increased METs and functional capacity (6MWT)       Able to understand and use rate of perceived exertion (RPE) scale  Yes       Intervention  Provide education and explanation on how to use RPE scale       Expected Outcomes  Short Term: Able to use RPE daily in rehab to express subjective intensity level;Long Term:  Able to use RPE to guide intensity level when exercising independently       Knowledge and understanding of Target Heart Rate Range (THRR)  Yes       Intervention  Provide education and explanation of THRR including how the numbers were predicted and where they are located for reference       Expected Outcomes   Long Term: Able to use THRR to govern intensity when exercising independently;Short Term: Able to state/look up THRR;Short Term: Able to use daily as guideline for intensity in rehab       Able to check pulse independently  Yes       Intervention  Provide education and demonstration on how to check pulse in carotid and radial arteries.;Review the importance of being able to check your own pulse for safety during independent exercise       Expected Outcomes  Short Term: Able to explain why pulse checking is important during independent exercise;Long Term: Able to check pulse independently and accurately       Understanding of Exercise Prescription  Yes       Intervention  Provide education, explanation, and written materials on patient's individual exercise prescription       Expected Outcomes  Short Term: Able to explain program exercise prescription;Long Term: Able to explain home exercise prescription to exercise independently          Nutrition & Weight - Outcomes: Pre Biometrics - 06/25/17  1126      Pre Biometrics   Height  5' 8.5" (1.74 m)    Weight  172 lb 9.9 oz (78.3 kg)    Waist Circumference  39 inches    Hip Circumference  39 inches    Waist to Hip Ratio  1 %    BMI (Calculated)  25.86    Triceps Skinfold  14 mm    % Body Fat  26.2 %    Grip Strength  36 kg    Flexibility  10 in    Single Leg Stand  4.63 seconds        Nutrition: Nutrition Therapy & Goals - 06/25/17 1157      Nutrition Therapy   Diet  Heart Healthy      Personal Nutrition Goals   Nutrition Goal  Pt to identify food quantities necessary to achieve weight loss of 6-13 lb at graduation from cardiac rehab.     Personal Goal #2  Pt to identify and limit food sources of saturated fat, trans fat, and sodium      Intervention Plan   Intervention  Prescribe, educate and counsel regarding individualized specific dietary modifications aiming towards targeted core components such as weight, hypertension, lipid  management, diabetes, heart failure and other comorbidities.    Expected Outcomes  Short Term Goal: Understand basic principles of dietary content, such as calories, fat, sodium, cholesterol and nutrients.;Long Term Goal: Adherence to prescribed nutrition plan.       Nutrition Discharge: Nutrition Assessments - 06/25/17 1157      MEDFICTS Scores   Pre Score  45       Education Questionnaire Score: Knowledge Questionnaire Score - 07/22/17 0942      Knowledge Questionnaire Score   Pre Score  19/24    Post Score  22/24       Goals reviewed with patient; copy given to patient. Pt graduated from cardiac rehab program today with completion of 14 exercise sessions in Phase II. Pt maintained good attendance and progressed nicely during his participation in rehab as evidenced by increased MET level.   Medication list reconciled. Repeat  PHQ score- 0 .  Pt has made significant lifestyle changes and should be commended for his success. Pt feels he has achieved his goals during cardiac rehab.   Pt plans to continue exercise at the [facility].

## 2017-08-05 ENCOUNTER — Encounter (HOSPITAL_COMMUNITY): Payer: Medicare HMO

## 2017-08-07 ENCOUNTER — Encounter (HOSPITAL_COMMUNITY): Payer: Medicare HMO

## 2017-08-10 ENCOUNTER — Encounter (HOSPITAL_COMMUNITY): Payer: Medicare HMO

## 2017-08-12 ENCOUNTER — Encounter (HOSPITAL_COMMUNITY): Payer: Medicare HMO

## 2017-08-14 ENCOUNTER — Encounter (HOSPITAL_COMMUNITY): Payer: Medicare HMO

## 2017-08-17 ENCOUNTER — Encounter (HOSPITAL_COMMUNITY): Payer: Medicare HMO

## 2017-08-18 DIAGNOSIS — Z6825 Body mass index (BMI) 25.0-25.9, adult: Secondary | ICD-10-CM | POA: Diagnosis not present

## 2017-08-18 DIAGNOSIS — S76301A Unspecified injury of muscle, fascia and tendon of the posterior muscle group at thigh level, right thigh, initial encounter: Secondary | ICD-10-CM | POA: Diagnosis not present

## 2017-08-19 ENCOUNTER — Encounter (HOSPITAL_COMMUNITY): Payer: Medicare HMO

## 2017-08-21 ENCOUNTER — Encounter (HOSPITAL_COMMUNITY): Payer: Medicare HMO

## 2017-08-24 ENCOUNTER — Encounter (HOSPITAL_COMMUNITY): Payer: Medicare HMO

## 2017-08-25 DIAGNOSIS — Z85828 Personal history of other malignant neoplasm of skin: Secondary | ICD-10-CM | POA: Diagnosis not present

## 2017-08-25 DIAGNOSIS — D1801 Hemangioma of skin and subcutaneous tissue: Secondary | ICD-10-CM | POA: Diagnosis not present

## 2017-08-25 DIAGNOSIS — D225 Melanocytic nevi of trunk: Secondary | ICD-10-CM | POA: Diagnosis not present

## 2017-08-25 DIAGNOSIS — L821 Other seborrheic keratosis: Secondary | ICD-10-CM | POA: Diagnosis not present

## 2017-08-25 DIAGNOSIS — D485 Neoplasm of uncertain behavior of skin: Secondary | ICD-10-CM | POA: Diagnosis not present

## 2017-08-25 DIAGNOSIS — L430 Hypertrophic lichen planus: Secondary | ICD-10-CM | POA: Diagnosis not present

## 2017-08-25 DIAGNOSIS — L570 Actinic keratosis: Secondary | ICD-10-CM | POA: Diagnosis not present

## 2017-08-25 DIAGNOSIS — L814 Other melanin hyperpigmentation: Secondary | ICD-10-CM | POA: Diagnosis not present

## 2017-08-25 DIAGNOSIS — B353 Tinea pedis: Secondary | ICD-10-CM | POA: Diagnosis not present

## 2017-08-25 DIAGNOSIS — C44619 Basal cell carcinoma of skin of left upper limb, including shoulder: Secondary | ICD-10-CM | POA: Diagnosis not present

## 2017-08-26 ENCOUNTER — Encounter (HOSPITAL_COMMUNITY): Payer: Medicare HMO

## 2017-08-28 ENCOUNTER — Encounter (HOSPITAL_COMMUNITY): Payer: Medicare HMO

## 2017-08-31 ENCOUNTER — Encounter (HOSPITAL_COMMUNITY): Payer: Medicare HMO

## 2017-09-01 ENCOUNTER — Telehealth: Payer: Self-pay | Admitting: Cardiovascular Disease

## 2017-09-01 NOTE — Telephone Encounter (Signed)
Confirmed with patient he increased Crestor as directed in January. Instructed him to come fasting to appointment tomorrow to see how the medication is doing. He is upset and annoyed he was not called to arrange labs prior to appointment. Informed him a note will be placed on the next recall to schedule lab appointment if Dr. Burt Knack thinks labs are necessary. He states "I am the customer and will have whatever I want." Then informed Nursing he expects samples of Ranexa at [facility] in the morning.  Samples retrieved to give patient in the morning.

## 2017-09-01 NOTE — Telephone Encounter (Signed)
New Message:      Pt is needing to know if he needs to have labs before appt

## 2017-09-02 ENCOUNTER — Encounter: Payer: Self-pay | Admitting: Cardiovascular Disease

## 2017-09-02 ENCOUNTER — Ambulatory Visit: Payer: Medicare HMO | Admitting: Cardiovascular Disease

## 2017-09-02 ENCOUNTER — Encounter (HOSPITAL_COMMUNITY): Payer: Medicare HMO

## 2017-09-02 VITALS — BP 122/78 | HR 61 | Ht 68.5 in | Wt 165.0 lb

## 2017-09-02 DIAGNOSIS — I2510 Atherosclerotic heart disease of native coronary artery without angina pectoris: Secondary | ICD-10-CM | POA: Diagnosis not present

## 2017-09-02 DIAGNOSIS — I25119 Atherosclerotic heart disease of native coronary artery with unspecified angina pectoris: Secondary | ICD-10-CM

## 2017-09-02 DIAGNOSIS — E782 Mixed hyperlipidemia: Secondary | ICD-10-CM | POA: Diagnosis not present

## 2017-09-02 DIAGNOSIS — Z9861 Coronary angioplasty status: Secondary | ICD-10-CM

## 2017-09-02 MED ORDER — CLOPIDOGREL BISULFATE 75 MG PO TABS: 75.0000 mg | ORAL_TABLET | Freq: Every day | ORAL | 3 refills | Status: DC

## 2017-09-02 MED ORDER — LOSARTAN POTASSIUM 100 MG PO TABS: 100.0000 mg | ORAL_TABLET | Freq: Two times a day (BID) | ORAL | 3 refills | Status: DC

## 2017-09-02 MED ORDER — ISOSORBIDE MONONITRATE ER 60 MG PO TB24: 60.0000 mg | ORAL_TABLET | Freq: Every day | ORAL | 3 refills | Status: DC

## 2017-09-02 MED ORDER — METOPROLOL SUCCINATE ER 50 MG PO TB24: 50.0000 mg | ORAL_TABLET | Freq: Two times a day (BID) | ORAL | 3 refills | Status: DC

## 2017-09-02 NOTE — Patient Instructions (Signed)

## 2017-09-02 NOTE — Progress Notes (Signed)
Cardiology Office Note Date:  09/02/2017   ID:  Kyle Avila, DOB 03-15-1946, MRN [id number]  PCP:  [name], MD  Cardiologist:  [name], MD    Chief Complaint  Patient presents with  . Follow-up     History of Present Illness: Kyle Avila is a 72 y.o. male who presents for follow-up of coronary artery disease and chronic angina.  The patient has extensive CAD status post redo CABG in 1996 and subsequent PCI procedures involving saphenous vein graft interventions.  He has also undergone stenting of the native left circumflex.  His last PCI procedure in 2018 involved stenting of the saphenous vein graft to RCA.  The patient is here alone today.  He is doing very well.  He is been physically active with no anginal symptoms recently.  He has had good relief of angina since his last interventional procedure.  He is been out doing yard work with no symptoms.  Specifically denies chest pain, chest pressure, or shortness of breath.  He has not been engaged in formal exercise because he is been busy with other activities.  He has follow-up with his primary physician later this month with lab work planned.  Past Medical History:  Diagnosis Date  . Arthritis    "some in my back" (04/14/2017)  . CAD (coronary artery disease)    s/p CABG twice, PCI twice  . Carotid artery occlusion    s/p CEA  . Chronic renal insufficiency   . CKD (chronic kidney disease), stage III ([place])   . Exertional angina ([place]) 11/03/2014  . Gout   . Heart block    s/p PTVDP  . HTN (hypertension)   . Hx of cardiovascular stress test    Adenosine Myoview (11/15):  Inferolateral and anterolateral infarct without significant ischemia, EF 42% - Intermediate Risk  . Hyperlipidemia   . 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 Mitral insufficiency   . Myocardial infarction ([place])    "I've had some mild one" (04/14/2017)  . Presence of permanent cardiac  pacemaker   . Skin cancer    "nose"  . 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 ACHILLES TENDON REPAIR Right   . CARDIAC CATHETERIZATION    . CARDIAC CATHETERIZATION N/A 11/03/2014   Procedure: Left Heart Cath and Cors/Grafts Angiography;  Surgeon: [name], MD;  Location: [facility];  Service: Cardiovascular;  Laterality: N/A;  . CARDIAC CATHETERIZATION N/A 11/03/2014   Procedure: Coronary Stent Intervention;  Surgeon: [name], MD;  Location: [facility];  Service: Cardiovascular;  Laterality: N/A;  . CAROTID ENDARTERECTOMY  2002   "? side"  . CATARACT EXTRACTION W/ INTRAOCULAR LENS IMPLANT     "? side"  . CORONARY ANGIOPLASTY WITH STENT PLACEMENT  04/14/2017  . CORONARY ANGIOPLASTY WITH STENT PLACEMENT  11/03/2014   SVG PVA  . CORONARY ARTERY BYPASS GRAFT     with redo cabg  . EP IMPLANTABLE DEVICE N/A 09/13/2014   Procedure: PPM Generator Changeout;  Surgeon: Deboraha Sprang, MD;  Location: [facility];  Service: Cardiovascular;  Laterality: N/A;  . LEFT HEART CATH AND CORS/GRAFTS ANGIOGRAPHY N/A 04/14/2017   Procedure: LEFT HEART CATH AND CORS/GRAFTS ANGIOGRAPHY;  Surgeon: [name], MD;  Location: [facility];  Service: Cardiovascular;  Laterality: N/A;  . MOHS SURGERY     "just outside my  nose"  . PACEMAKER INSERTION  2007  . TONSILLECTOMY  1958    Current Outpatient Medications  Medication Sig Dispense Refill  . acetaminophen (TYLENOL) 325 MG tablet Take 2 tablets (650 mg total) by mouth every 4 (four) hours as needed for headache or mild pain.    Marland Kitchen allopurinol (ZYLOPRIM) 100 MG tablet Take 100 mg by mouth daily.     Marland Kitchen amLODipine (NORVASC) 10 MG tablet Take 1 tablet (10 mg total) by mouth daily. 90 tablet 3  . aspirin EC 81 MG EC tablet Take 81 mg by mouth daily.  1 tablet 0  . clopidogrel (PLAVIX) 75 MG tablet Take 1 tablet (75 mg total) by mouth daily. . 90  tablet 3  . furosemide (LASIX) 40 MG tablet Take 1 tablet (40 mg total) by mouth daily. 30 tablet   . GUAIFENESIN PO Take 400 mg by mouth daily.     . isosorbide mononitrate (IMDUR) 60 MG 24 hr tablet Take 1 tablet (60 mg total) by mouth daily. 90 tablet 3  . ketoconazole (NIZORAL) 2 % cream Apply 1 application topically 2 (two) times daily as needed for irritation (itching).     Marland Kitchen losartan (COZAAR) 100 MG tablet Take 1 tablet (100 mg total) by mouth 2 (two) times daily. 180 tablet 3  . meclizine (ANTIVERT) 25 MG tablet Take 25 mg by mouth 3 (three) times daily as needed for dizziness.     . methylPREDNISolone (MEDROL DOSEPAK) 4 MG TBPK tablet Take 10 mg by mouth 2 (two) times daily.    . metoprolol succinate (TOPROL-XL) 50 MG 24 hr tablet Take 1 tablet (50 mg total) by mouth 2 (two) times daily. Verified that it is succinate twice daily 180 tablet 3  . Multiple Vitamins-Minerals (PRESERVISION AREDS 2 PO) Take by mouth 2 times daily at 12 noon and 4 pm.    . nitroGLYCERIN (NITROSTAT) 0.4 MG SL tablet PLACE ONE TABLET UNDER THE TONGUE EVERY FIVE MINUTES AS NEEDED FOR CHEST PAIN 25 tablet 3  . ranolazine (RANEXA) 1000 MG SR tablet Take 1 tablet (1,000 mg total) by mouth 2 (two) times daily. 180 tablet 3  . rosuvastatin (CRESTOR) 20 MG tablet Take 1 tablet (20 mg total) by mouth daily. 90 tablet 3  . tamsulosin (FLOMAX) 0.4 MG CAPS 0.4 mg daily. Take 0.4 mg by mouth once daily     No current facility-administered medications for this visit.     Allergies:   Patient has no known allergies.   Social History:  The patient  reports that he has never smoked. He has never used smokeless tobacco. He reports that he drinks about 1.8 oz of alcohol per week. He reports that he does not use drugs.   Family History:  [name]  family history includes Cancer in his mother; Heart attack in his father; Heart disease in his father.    ROS:  Please see the history of present illness.   All other systems  are reviewed and negative.    PHYSICAL EXAM: VS:  BP 122/78   Pulse 61   Ht 5' 8.5" (1.74 m)   Wt 165 lb (74.8 kg)   SpO2 96%   BMI 24.72 kg/m  , BMI Body mass index is 24.72 kg/m. GEN: Well nourished, well developed, in no acute distress  HEENT: normal  Neck: no JVD, no masses. No carotid bruits Cardiac: RRR without murmur or gallop  Respiratory:  clear to auscultation bilaterally, normal work of breathing GI: soft, nontender, nondistended, + BS MS: no deformity or atrophy  Ext: no pretibial edema, pedal pulses 2+= bilaterally Skin: warm and dry, no rash Neuro:  Strength and sensation are intact Psych: euthymic mood, full affect  EKG:  EKG is not ordered today.  Recent Labs: 04/15/2017: Hemoglobin 13.5; Platelets 166 04/30/2017: BUN 49; Creatinine, Ser 2.04; Potassium 5.0; Sodium 140   Lipid Panel     Component Value Date/Time   CHOL 159 02/12/2016 0803   TRIG 130 02/12/2016 0803   HDL 46 02/12/2016 0803   CHOLHDL 3.5 02/12/2016 0803   VLDL 26 02/12/2016 0803   LDLCALC 87 02/12/2016 0803      Wt Readings from Last 3 Encounters:  09/02/17 165 lb (74.8 kg)  08/03/17 168 lb 14 oz (76.6 kg)  06/25/17 172 lb 9.9 oz (78.3 kg)     Cardiac Studies Reviewed: Cardiac Cath 04-14-2018: Conclusion   1. Severe native vessel CAD With total occlusion of the ramus branch, LAD, and RCA 2. S/P CABG with continued patency of the LIMA-LAD and moderate stenosis at it's coronary anastomosis 3. Severe stenosis of the SVG-PDA treated successfully with DES implantation (distal in-stent restenosis) and de-novo mid vessel disease  Plan: hydration overnight, check metabolic panel in am, continue lifelong DAPT with ASA and clopidogrel.    ASSESSMENT AND PLAN: 1.  Coronary artery disease, native vessel, with angina: The patient remains on amlodipine, isosorbide, metoprolol succinate, and ranolazine.  His anginal threshold has improved.  No changes are made in his medical  regimen today.  He continues on long-term dual antiplatelet therapy with aspirin and clopidogrel.  2.  Hypertension: Regimen reviewed.  He brings in a long list of home blood pressure readings which demonstrate good control.  He does have some fluctuation in his blood pressure with readings as low as 94 mmHg and is high as 160 mmHg.  However, the vast majority of his systolic readings range from 100 to 130 mmHg.  3.  Hyperlipidemia: He is treated with rosuvastatin.  Lipids are followed by Dr. Dagmar Hait.  Upcoming labs planned.  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 Treated with an ARB. Current medicines are reviewed with the patient today.  The patient does not have concerns regarding medicines.  Labs/ tests ordered today include:  No orders of the defined types were placed in this encounter.   Disposition:   FU 6 months  Signed, [name], MD  09/02/2017 9:15 AM    [address], [place], [place]  [address] Phone: [phone]; Fax: [phone]

## 2017-09-04 ENCOUNTER — Encounter (HOSPITAL_COMMUNITY): Payer: Medicare HMO

## 2017-09-15 DIAGNOSIS — E038 Other specified hypothyroidism: Secondary | ICD-10-CM | POA: Diagnosis not present

## 2017-09-15 DIAGNOSIS — E7849 Other hyperlipidemia: Secondary | ICD-10-CM | POA: Diagnosis not present

## 2017-09-15 DIAGNOSIS — Z125 Encounter for screening for malignant neoplasm of prostate: Secondary | ICD-10-CM | POA: Diagnosis not present

## 2017-09-15 DIAGNOSIS — I10 Essential (primary) hypertension: Secondary | ICD-10-CM | POA: Diagnosis not present

## 2017-09-15 DIAGNOSIS — R82998 Other abnormal findings in urine: Secondary | ICD-10-CM | POA: Diagnosis not present

## 2017-09-15 DIAGNOSIS — M109 Gout, unspecified: Secondary | ICD-10-CM | POA: Diagnosis not present

## 2017-09-18 DIAGNOSIS — Z1212 Encounter for screening for malignant neoplasm of rectum: Secondary | ICD-10-CM | POA: Diagnosis not present

## 2017-09-21 ENCOUNTER — Other Ambulatory Visit: Payer: Self-pay | Admitting: Cardiovascular Disease

## 2017-09-21 DIAGNOSIS — I2510 Atherosclerotic heart disease of native coronary artery without angina pectoris: Secondary | ICD-10-CM

## 2017-09-21 DIAGNOSIS — Z9861 Coronary angioplasty status: Secondary | ICD-10-CM

## 2017-09-21 DIAGNOSIS — E782 Mixed hyperlipidemia: Secondary | ICD-10-CM

## 2017-09-22 DIAGNOSIS — I7389 Other specified peripheral vascular diseases: Secondary | ICD-10-CM | POA: Diagnosis not present

## 2017-09-22 DIAGNOSIS — I6521 Occlusion and stenosis of right carotid artery: Secondary | ICD-10-CM | POA: Diagnosis not present

## 2017-09-22 DIAGNOSIS — I10 Essential (primary) hypertension: Secondary | ICD-10-CM | POA: Diagnosis not present

## 2017-09-22 DIAGNOSIS — I25118 Atherosclerotic heart disease of native coronary artery with other forms of angina pectoris: Secondary | ICD-10-CM | POA: Diagnosis not present

## 2017-09-22 DIAGNOSIS — Z950 Presence of cardiac pacemaker: Secondary | ICD-10-CM | POA: Diagnosis not present

## 2017-09-22 DIAGNOSIS — M109 Gout, unspecified: Secondary | ICD-10-CM | POA: Diagnosis not present

## 2017-09-22 DIAGNOSIS — D692 Other nonthrombocytopenic purpura: Secondary | ICD-10-CM | POA: Diagnosis not present

## 2017-09-22 DIAGNOSIS — Z0000 Encounter for general adult medical examination without abnormal findings: Secondary | ICD-10-CM | POA: Diagnosis not present

## 2017-09-22 DIAGNOSIS — I739 Peripheral vascular disease, unspecified: Secondary | ICD-10-CM | POA: Diagnosis not present

## 2017-09-22 DIAGNOSIS — N183 Chronic kidney disease, stage 3 (moderate): Secondary | ICD-10-CM | POA: Diagnosis not present

## 2017-09-22 DIAGNOSIS — E7849 Other hyperlipidemia: Secondary | ICD-10-CM | POA: Diagnosis not present

## 2017-09-23 ENCOUNTER — Ambulatory Visit (INDEPENDENT_AMBULATORY_CARE_PROVIDER_SITE_OTHER): Payer: Medicare HMO | Admitting: *Deleted

## 2017-09-23 ENCOUNTER — Other Ambulatory Visit: Payer: Self-pay | Admitting: Cardiovascular Disease

## 2017-09-23 DIAGNOSIS — I442 Atrioventricular block, complete: Secondary | ICD-10-CM | POA: Diagnosis not present

## 2017-09-25 LAB — CUP PACEART REMOTE DEVICE CHECK
Battery Impedance: 182 Ohm
Battery Voltage: 2.78 V
Brady Statistic AS VS Percent: 0 %
Date Time Interrogation Session: 20190529165604
Implantable Lead Implant Date: 20070831
Implantable Lead Location: 753859
Implantable Lead Model: 4469
Implantable Lead Model: 4470
Implantable Lead Serial Number: 485949
Lead Channel Impedance Value: 446 Ohm
Lead Channel Pacing Threshold Pulse Width: 0.4 ms
Lead Channel Setting Pacing Amplitude: 2 V
Lead Channel Setting Pacing Amplitude: 2.5 V
Lead Channel Setting Pacing Pulse Width: 0.4 ms
Lead Channel Setting Sensing Sensitivity: 2.8 mV
MDC IDC LEAD IMPLANT DT: 20070831
MDC IDC LEAD LOCATION: 753860
MDC IDC LEAD SERIAL: 553665
MDC IDC MSMT BATTERY REMAINING LONGEVITY: 112 mo
MDC IDC MSMT LEADCHNL RA IMPEDANCE VALUE: 470 Ohm
MDC IDC MSMT LEADCHNL RA PACING THRESHOLD AMPLITUDE: 0.625 V
MDC IDC MSMT LEADCHNL RA PACING THRESHOLD PULSEWIDTH: 0.4 ms
MDC IDC MSMT LEADCHNL RV PACING THRESHOLD AMPLITUDE: 0.625 V
MDC IDC PG IMPLANT DT: 20160518
MDC IDC STAT BRADY AP VP PERCENT: 51 %
MDC IDC STAT BRADY AP VS PERCENT: 0 %
MDC IDC STAT BRADY AS VP PERCENT: 49 %

## 2017-09-25 NOTE — Progress Notes (Signed)
Remote pacemaker transmission.   

## 2017-09-25 NOTE — Progress Notes (Signed)
letter

## 2017-09-30 ENCOUNTER — Telehealth: Payer: Self-pay | Admitting: Cardiovascular Disease

## 2017-09-30 ENCOUNTER — Other Ambulatory Visit: Payer: Self-pay | Admitting: Cardiovascular Disease

## 2017-09-30 DIAGNOSIS — I25119 Atherosclerotic heart disease of native coronary artery with unspecified angina pectoris: Secondary | ICD-10-CM

## 2017-09-30 MED ORDER — RANOLAZINE ER 1000 MG PO TB12
1000.0000 mg | ORAL_TABLET | Freq: Two times a day (BID) | ORAL | 3 refills | Status: DC
Start: 2017-09-30 — End: 2018-03-15

## 2017-09-30 NOTE — Telephone Encounter (Signed)
Patient states he lost his Ranexa bottle. Another refill was called in today and [facility] filled. 2 weeks of samples placed out front for patient per request. He was grateful for assistance.

## 2017-09-30 NOTE — Telephone Encounter (Signed)
New Message:       Pt c/o medication issue:  1. Name of Medication: ranolazine (RANEXA) 1000 MG SR tablet  2. How are you currently taking this medication (dosage and times per day)?Take 1 tablet (1,000 mg total) by mouth 2 (two) times daily.   3. Are you having a reaction (difficulty breathing--STAT)? No  4. What is your medication issue? Pt states he has misplaced his medicine and only has one left. Pt just needs to discuss this medication with someone before he gets a refill

## 2017-10-12 ENCOUNTER — Other Ambulatory Visit: Payer: Self-pay

## 2017-10-12 MED ORDER — ISOSORBIDE MONONITRATE ER 60 MG PO TB24: 60.0000 mg | ORAL_TABLET | Freq: Every day | ORAL | 3 refills | Status: DC

## 2017-10-13 DIAGNOSIS — E7849 Other hyperlipidemia: Secondary | ICD-10-CM | POA: Diagnosis not present

## 2017-10-13 DIAGNOSIS — Z6825 Body mass index (BMI) 25.0-25.9, adult: Secondary | ICD-10-CM | POA: Diagnosis not present

## 2017-10-13 DIAGNOSIS — I25118 Atherosclerotic heart disease of native coronary artery with other forms of angina pectoris: Secondary | ICD-10-CM | POA: Diagnosis not present

## 2017-10-13 DIAGNOSIS — I10 Essential (primary) hypertension: Secondary | ICD-10-CM | POA: Diagnosis not present

## 2017-10-14 ENCOUNTER — Telehealth: Payer: Self-pay | Admitting: Cardiovascular Disease

## 2017-10-14 DIAGNOSIS — K921 Melena: Secondary | ICD-10-CM | POA: Diagnosis not present

## 2017-10-14 DIAGNOSIS — Z86010 Personal history of colonic polyps: Secondary | ICD-10-CM | POA: Diagnosis not present

## 2017-10-14 NOTE — Telephone Encounter (Signed)
New message   Patient calling with questions about taking Repatha. Please call

## 2017-10-14 NOTE — Telephone Encounter (Signed)
The patient reports Dr. Dagmar Hait is going to start him on Repatha next week and he wanted to know if this is OK. Discussed with Megan, pharmD who stated this is fine.  Med list updated.

## 2017-10-15 ENCOUNTER — Telehealth: Payer: Self-pay | Admitting: Cardiovascular Disease

## 2017-10-15 NOTE — Telephone Encounter (Signed)
New message:          [facility] Pre-operative Risk Assessment    Request for surgical clearance:  1. What type of surgery is being performed? Colonoscopy   2. When is this surgery scheduled? 10/23/17  3. What type of clearance is required (medical clearance vs. Pharmacy clearance to hold med vs. Both)? Pharmacy  4. Are there any medications that need to be held prior to surgery and how long? clopidogrel (PLAVIX) 75 MG tablet five days before procedure  5. Practice name and name of physician performing surgery? Dr. Elta Guadeloupe Magod/ [facility]   6. What is your office phone number [phone]   7.   What is your office fax number [phone]  8.   Anesthesia type (None, local, MAC, general) ? General   Kyle Avila 10/15/2017, 9:37 AM  _________________________________________________________________   (provider comments below)

## 2017-10-15 NOTE — Telephone Encounter (Signed)
Should be ok if colonoscopy is really important for him to undergo. If it's just a screening study would consider risks and benefits further. If need for polyp surveillance, he is > 6 months out from PCI and it is reasonable to hold plavix x 5 days, improtant for him to stay on ASA without interruption. thx

## 2017-10-15 NOTE — Telephone Encounter (Signed)
Dr. Burt Knack, this pt has significant CAD with last PCI in 03/2017. He is planned for colonoscopy. Can he be off Plavix for 5 days prior to procedure as requested?  Please route response back to CV DIV PREOP  Thanks, Gae Bon

## 2017-10-16 NOTE — Telephone Encounter (Signed)
   Primary Cardiologist: [name], MD  Chart reviewed as part of pre-operative protocol coverage. Patient was contacted 10/16/2017 in reference to pre-operative risk assessment for pending surgery as outlined below.  Kyle Avila was last seen on 09/02/17 by Dr. Burt Knack.  Since that day, Kyle Avila has done well with no chest pain, shortness of breath or new symptoms.   Kyle Avila has extensive CAD status post redo CABG in 1996 and subsequent PCI procedures involving saphenous vein graft and native circ.Most recent DES to saphenous vein graft to RCA in 03/2017.   Per Dr. Burt Knack it should be ok if colonoscopy is really important for him to undergo. If it's just a screening study would consider risks and benefits further. If need for polyp surveillance, he is > 6 months out from PCI and it is reasonable to hold plavix x 5 days, important for him to stay on ASA without interruption.   Therefore, based on ACC/AHA guidelines, the patient would be at acceptable risk for the planned procedure without further cardiovascular testing.   I will route this recommendation to the requesting party via Epic fax function and remove from pre-op pool.  Please call with questions.  Kyle Perch, NP 10/16/2017, 1:24 PM

## 2017-10-19 ENCOUNTER — Telehealth: Payer: Self-pay | Admitting: Cardiovascular Disease

## 2017-10-19 NOTE — Telephone Encounter (Signed)
Kyle Mocha, MD  to Kyle Perch, NP . Cv Div Preop     11:29 PM  Note    Should be ok if colonoscopy is really important for him to undergo. If it's just a screening study would consider risks and benefits further. If need for polyp surveillance, he is > 6 months out from PCI and it is reasonable to hold plavix x 5 days, improtant for him to stay on ASA without interruption. thx           Reviewed clearance information (see 6/20 phone note) with patient. Informed him it is up to Dr. Watt Avila whether or not the procedure will be done at this time. He understands information has been faxed to Dr. Perley Avila office. He will call Dr. Watt Avila tomorrow for an update.

## 2017-10-19 NOTE — Telephone Encounter (Signed)
New Message    Patient is calling because he wants to know if he needs to postpone his colonoscopy. He does have a clearance in the system in he states that the office did not have the information

## 2017-10-19 NOTE — Telephone Encounter (Signed)
Follow up     Epic crashed in the middle of creating note    Patient is calling because he wants to know if he needs to postpone his colonoscopy. He does have a clearance in the system. He states that the office was having some discrepancy as to what Dr. Burt Knack was suggesting. I offered to have the pre-op clearance recent. He decline and only wanted to speak with the nurse.

## 2017-10-23 DIAGNOSIS — K5730 Diverticulosis of large intestine without perforation or abscess without bleeding: Secondary | ICD-10-CM | POA: Diagnosis not present

## 2017-10-23 DIAGNOSIS — Z86010 Personal history of colonic polyps: Secondary | ICD-10-CM | POA: Diagnosis not present

## 2017-10-23 DIAGNOSIS — D126 Benign neoplasm of colon, unspecified: Secondary | ICD-10-CM | POA: Diagnosis not present

## 2017-10-27 DIAGNOSIS — D126 Benign neoplasm of colon, unspecified: Secondary | ICD-10-CM | POA: Diagnosis not present

## 2017-10-27 DIAGNOSIS — E7849 Other hyperlipidemia: Secondary | ICD-10-CM | POA: Diagnosis not present

## 2017-10-27 DIAGNOSIS — Z6825 Body mass index (BMI) 25.0-25.9, adult: Secondary | ICD-10-CM | POA: Diagnosis not present

## 2017-11-02 ENCOUNTER — Other Ambulatory Visit: Payer: Self-pay | Admitting: Cardiovascular Disease

## 2017-11-03 ENCOUNTER — Other Ambulatory Visit: Payer: Self-pay | Admitting: Cardiovascular Disease

## 2017-11-03 MED ORDER — NITROGLYCERIN 0.4 MG SL SUBL: SUBLINGUAL_TABLET | SUBLINGUAL | 5 refills | Status: DC

## 2017-11-03 NOTE — Telephone Encounter (Signed)
Pt's medication was sent to pt's pharmacy as requested. Confirmation received.  °

## 2017-11-03 NOTE — Telephone Encounter (Signed)
Outpatient Medication Detail    Disp Refills Start End   nitroGLYCERIN (NITROSTAT) 0.4 MG SL tablet 25 tablet 5 11/03/2017    Sig: PLACE ONE TABLET UNDER THE TONGUE EVERY FIVE MINUTES AS NEEDED FOR CHEST PAIN   Sent to pharmacy as: nitroGLYCERIN (NITROSTAT) 0.4 MG SL tablet   E-Prescribing Status: Receipt confirmed by pharmacy (11/03/2017 8:55 AM EDT)   [place], [place]

## 2017-11-05 ENCOUNTER — Encounter: Payer: Self-pay | Admitting: Cardiovascular Disease

## 2017-11-05 ENCOUNTER — Telehealth: Payer: Self-pay | Admitting: Cardiovascular Disease

## 2017-11-05 NOTE — Telephone Encounter (Signed)
Mr. Kyle Avila calls today to complain that his NTG "isn't the same as it used to be." He says it crumbles faster and does not give him a headache like he's used to having. He states he called and complained to the manufacturer and asked them to call the office to discuss. He says he takes it as a preventative for CP prior to  exercising.   Informed the patient he should not take NTG unless he is having anginal symptoms.  Suggested to him DAW Rx could be called in, but he is not interested at this time. He requests a call next week to follow-up after manufacturer calls.  He was grateful for assistance.

## 2017-11-05 NOTE — Telephone Encounter (Signed)
New Message      Pt c/o medication issue:  1. Name of Medication: Nitro   2. How are you currently taking this medication (dosage and times per day)?    3. Are you having a reaction (difficulty breathing--STAT)? No   4. What is your medication issue? Patient takes Nitro before he exercising. Patient states that his pill are coming to him from the mail order pharmacy crumbled, he is not getting the proper dosage.

## 2017-11-10 MED ORDER — NITROGLYCERIN 0.4 MG SL SUBL: 0.4000 mg | SUBLINGUAL_TABLET | SUBLINGUAL | 6 refills | Status: DC | PRN

## 2017-11-11 DIAGNOSIS — I10 Essential (primary) hypertension: Secondary | ICD-10-CM | POA: Diagnosis not present

## 2017-11-11 DIAGNOSIS — J40 Bronchitis, not specified as acute or chronic: Secondary | ICD-10-CM | POA: Diagnosis not present

## 2017-11-11 DIAGNOSIS — R05 Cough: Secondary | ICD-10-CM | POA: Diagnosis not present

## 2017-11-24 DIAGNOSIS — K921 Melena: Secondary | ICD-10-CM | POA: Diagnosis not present

## 2017-12-01 DIAGNOSIS — K921 Melena: Secondary | ICD-10-CM | POA: Diagnosis not present

## 2017-12-04 DIAGNOSIS — E7849 Other hyperlipidemia: Secondary | ICD-10-CM | POA: Diagnosis not present

## 2017-12-19 ENCOUNTER — Other Ambulatory Visit: Payer: Self-pay | Admitting: Cardiovascular Disease

## 2017-12-22 MED ORDER — METOPROLOL SUCCINATE ER 50 MG PO TB24: 50.0000 mg | ORAL_TABLET | Freq: Two times a day (BID) | ORAL | 2 refills | Status: DC

## 2017-12-22 NOTE — Telephone Encounter (Signed)
Pt's medication was resent with correct sig. Confirmation received.

## 2017-12-22 NOTE — Addendum Note (Signed)
Addended by: Derl Barrow on: 12/22/2017 03:11 PM   Modules accepted: Orders

## 2017-12-23 ENCOUNTER — Telehealth: Payer: Self-pay

## 2017-12-23 ENCOUNTER — Ambulatory Visit (INDEPENDENT_AMBULATORY_CARE_PROVIDER_SITE_OTHER): Payer: Medicare HMO | Admitting: *Deleted

## 2017-12-23 DIAGNOSIS — I442 Atrioventricular block, complete: Secondary | ICD-10-CM | POA: Diagnosis not present

## 2017-12-23 NOTE — Telephone Encounter (Signed)
Spoke with pt and reminded pt of remote transmission that is due today. Pt verbalized understanding.   

## 2017-12-25 ENCOUNTER — Encounter: Payer: Self-pay | Admitting: Cardiology

## 2017-12-25 NOTE — Progress Notes (Signed)
Remote pacemaker transmission.   

## 2018-01-07 ENCOUNTER — Other Ambulatory Visit: Payer: Self-pay

## 2018-01-12 LAB — CUP PACEART REMOTE DEVICE CHECK
Battery Impedance: 159 Ohm
Battery Voltage: 2.78 V
Brady Statistic AP VP Percent: 50 %
Brady Statistic AP VS Percent: 0 %
Brady Statistic AS VS Percent: 0 %
Date Time Interrogation Session: 20190830152639
Implantable Lead Implant Date: 20070831
Implantable Lead Implant Date: 20070831
Implantable Lead Location: 753859
Implantable Lead Model: 4469
Implantable Lead Model: 4470
Implantable Lead Serial Number: 553665
Lead Channel Impedance Value: 472 Ohm
Lead Channel Impedance Value: 483 Ohm
Lead Channel Pacing Threshold Amplitude: 0.625 V
Lead Channel Pacing Threshold Pulse Width: 0.4 ms
Lead Channel Setting Pacing Amplitude: 2.5 V
Lead Channel Setting Pacing Pulse Width: 0.4 ms
MDC IDC LEAD LOCATION: 753860
MDC IDC LEAD SERIAL: 485949
MDC IDC MSMT BATTERY REMAINING LONGEVITY: 118 mo
MDC IDC MSMT LEADCHNL RV PACING THRESHOLD AMPLITUDE: 0.75 V
MDC IDC MSMT LEADCHNL RV PACING THRESHOLD PULSEWIDTH: 0.4 ms
MDC IDC PG IMPLANT DT: 20160518
MDC IDC SET LEADCHNL RA PACING AMPLITUDE: 2 V
MDC IDC SET LEADCHNL RV SENSING SENSITIVITY: 2.8 mV
MDC IDC STAT BRADY AS VP PERCENT: 50 %

## 2018-01-28 NOTE — Telephone Encounter (Signed)
Left message to call back  

## 2018-01-28 NOTE — Telephone Encounter (Signed)
Kyle Avila is going to [facility] tomorrow and will not be back until Monday night. He will stay well hydrated while out of town and will rest if he does not feel well. He is currently asymptomatic and his BP is 115/53.  He will come Tuesday for an appointment with Richardson Dopp to have his blood pressure checked. He will bring his BP monitors to check accuracy. He was grateful for call and agrees with treatment plan.

## 2018-01-28 NOTE — Telephone Encounter (Signed)
Pt returned this office call please give him a call back.

## 2018-01-29 ENCOUNTER — Ambulatory Visit: Payer: Medicare HMO | Admitting: Physician Assistant

## 2018-02-01 MED ORDER — ISOSORBIDE MONONITRATE ER 60 MG PO TB24: 60.0000 mg | ORAL_TABLET | Freq: Every day | ORAL | 0 refills | Status: DC

## 2018-02-01 NOTE — Telephone Encounter (Signed)
Mr. Fults reports since he has been in [place], he has experienced CP more frequently and has been "popping his nitro like tic tacs." The CP resolves with NTG, but returns later. At 0400 today, he noticed he did not take is Imdur with him so he has not had it in several days. He requests 2 tablets called in to [facility] on [address] in [place]. He has an appointment with Richardson Dopp tomorrow. He understands to seek emergency medical attention if symptoms worsen or will not resolve with NTG prior to that visit. He was grateful for assistance.

## 2018-02-02 ENCOUNTER — Encounter: Payer: Self-pay | Admitting: Physician Assistant

## 2018-02-02 ENCOUNTER — Ambulatory Visit: Payer: Medicare HMO | Admitting: Physician Assistant

## 2018-02-02 VITALS — BP 132/64 | HR 60 | Ht 68.5 in | Wt 170.0 lb

## 2018-02-02 DIAGNOSIS — I25119 Atherosclerotic heart disease of native coronary artery with unspecified angina pectoris: Secondary | ICD-10-CM

## 2018-02-02 DIAGNOSIS — I10 Essential (primary) hypertension: Secondary | ICD-10-CM | POA: Diagnosis not present

## 2018-02-02 DIAGNOSIS — N184 Chronic kidney disease, stage 4 (severe): Secondary | ICD-10-CM | POA: Diagnosis not present

## 2018-02-02 DIAGNOSIS — E785 Hyperlipidemia, unspecified: Secondary | ICD-10-CM

## 2018-02-02 MED ORDER — ISOSORBIDE MONONITRATE ER 60 MG PO TB24: 90.0000 mg | ORAL_TABLET | Freq: Every day | ORAL | 3 refills | Status: DC

## 2018-02-02 NOTE — Progress Notes (Signed)
Cardiology Office Note:    Date:  02/02/2018   ID:  Kyle Avila, DOB 06/18/1945, MRN [id number]  PCP:  Kyle Solian, MD  Cardiologist:  Kyle Mocha, MD    Electrophysiologist:  Kyle Axe, MD   Referring MD: Kyle Solian, MD   Chief Complaint  Patient presents with  . BP issues; angina     History of Present Illness:    Kyle Avila is a 72 y.o. male with coronary artery disease status post CABG in 1988 and redo bypass in 1996.  He has subsequently undergone multiple PCI procedures including drug-eluting stent x2 to the left circumflex in 2009, drug-eluting stent to the vein graft to the PDA 2016 and drug-eluting stent x2 to the vein graft to the PDA in 2018.  He also has carotid disease status post right carotid endarterectomy, complete heart block status post pacemaker, chronic kidney disease, hypertension and hyperlipidemia.  He was last seen by Dr. Burt Avila in May 2019.  He recently contacted the office with issues with his blood pressure as well as recurrent chest pain.     Kyle Avila returns for follow-up on blood pressure and CAD with angina.  He is here alone.  He traveled to [place] a few weeks ago.  He forgot his isosorbide.  He had worsening angina until this medication was resumed.  He also had fluctuations in his blood pressure.  He had some difficulty with his blood pressure machine and had to replace the batteries.  He stopped his amlodipine.  He restarted his amlodipine for a short period of time and then stopped it again.  Blood pressure over the last few days has been optimal.  It had previously been as low as 70 systolic.  He has not had syncope, orthopnea, paroxysmal nocturnal dyspnea, lower extremity swelling or shortness of breath.  He has fairly stable angina with walking a long distance.  He has been trying to increase his activity recently but has been frustrated by the limitations caused by his angina.  Overall, his pattern has been fairly stable.    Prior CV studies:   The following studies were reviewed today:  Cardiac catheterization 04/14/2017 LM 100 LAD ostial 100, mid 50 at LIMA anastomosis, mid to distal 40 beyond graft insertion RI ostial 100 LCx stent patent with 20 ISR; OM1 100 RCA ostial 100-CTO SVG-RPDA proximal 60, distal stent 90 ISR SVG-OM1 100 LIMA-LAD patent PCI: 4 x 16 mm Promus Premier DES to the proximal SVG-RPDA PCI: 4 x 24 mm Promus Premier DES to the distal SVG-RPDA        Echo 06/24/2016 Mild concentric LVH, EF 45-50, inferolateral/inferior/inferoseptal hypokinesis, grade 1 diastolic dysfunction, mild MR, mild LAE, mildly reduced RVSF, mild TR, PASP 32  LHC 11/03/14 LM:  100% LAD:  Mid to dist 40%, 40% beyond graft insertion LCx:  Ost to mid 20%, stent ok RCA:  Ostial 100% S-RPDA dist 95% >> PCI:  4 x 15 mm Xience DES  S-OM occluded L-LAD ok  Final conclusion:  Severe native three-vessel coronary artery disease with total occlusion of the proximal LAD and total occlusion of the proximal RCA  Continued patency of the stented segment extending from the left mainstem into the left circumflex  Continued patency of the LIMA to LAD graft  Continued patency of the saphenous vein graft to right PDA, with severe stenosis in the distal body of the graft treated successfully with a drug-eluting stent platform using distal embolic protection Recommend continue dual  antiplatelets therapy indefinitely with aspirin and clopidogrel. As long as kidney function is stable, anticipate hospital discharge tomorrow. [name] losartan has been held and will be resumed if kidney function is stable.  Myoview 02/2014 Intermediate risk stress nuclear study with a fixed medium-sized, severe basal to mid inferolateral and basal to mid anterolateral perfusion defect. This suggests prior infarction without significant ischemia. Study rated intermediate risk due to low EF at 42%. LV Ejection Fraction: 42%. LV Wall Motion:  Lateral and inferior hypokinesis.   Carotid US 08/2839 R CEA patent LICA < 32%   Past Medical History:  Diagnosis Date  . Arthritis    "some in my back" (04/14/2017)  . CAD (coronary artery disease)    s/p CABG twice, PCI twice  . Carotid artery occlusion    s/p CEA  . Chronic renal insufficiency   . CKD (chronic kidney disease), stage III ([place])   . Exertional angina ([place]) 11/03/2014  . Gout   . Heart block    s/p PTVDP  . HTN (hypertension)   . Hx of cardiovascular stress test    Adenosine Myoview (11/15):  Inferolateral and anterolateral infarct without significant ischemia, EF 42% - Intermediate Risk  . Hyperlipidemia   . 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 Mitral insufficiency   . Myocardial infarction ([place])    "I've had some mild one" (04/14/2017)  . Presence of permanent cardiac pacemaker   . Skin cancer    "nose"  . VT (ventricular tachycardia)-nonsustained 05/20/2011   Surgical Hx: The patient  has a past surgical history that includes Coronary artery bypass graft; Pacemaker insertion (2007); Carotid endarterectomy (2002); Cardiac catheterization (N/A, 09/13/2014); Achilles tendon repair (Right); Cardiac catheterization; Cardiac catheterization (N/A, 11/03/2014); Cardiac catheterization (N/A, 11/03/2014); Coronary angioplasty with stent (04/14/2017); Tonsillectomy (1958); Mohs surgery; Cataract extraction w/ intraocular lens implant; Coronary angioplasty with stent (11/03/2014); and LEFT HEART CATH AND CORS/GRAFTS ANGIOGRAPHY (N/A, 04/14/2017).   Current Medications: Current Meds  Medication Sig  . acetaminophen (TYLENOL) 325 MG tablet Take 2 tablets (650 mg total) by mouth every 4 (four) hours as needed for headache or mild pain.  Marland Kitchen allopurinol (ZYLOPRIM) 100 MG tablet Take 100 mg by mouth daily.   Marland Kitchen aspirin EC 81 MG EC tablet Take 81 mg by mouth daily.   . clopidogrel (PLAVIX) 75 MG tablet TAKE 1 TABLET EVERY DAY  .  Evolocumab (REPATHA ) Inject into the skin.  . furosemide (LASIX) 40 MG tablet Take 1 tablet (40 mg total) by mouth daily.  Marland Kitchen ketoconazole (NIZORAL) 2 % cream Apply 1 application topically 2 (two) times daily as needed for irritation (itching).   Marland Kitchen losartan (COZAAR) 100 MG tablet TAKE 1 TABLET TWICE DAILY  . metoprolol succinate (TOPROL-XL) 50 MG 24 hr tablet Take 1 tablet (50 mg total) by mouth 2 (two) times daily. Take with or immediately following meals.  . nitroGLYCERIN (NITROSTAT) 0.4 MG SL tablet Place 1 tablet (0.4 mg total) under the tongue every 5 (five) minutes as needed for chest pain.  . ranolazine (RANEXA) 1000 MG SR tablet Take 1 tablet (1,000 mg total) by mouth 2 (two) times daily.  . rosuvastatin (CRESTOR) 20 MG tablet Take 1 tablet (20 mg total) by mouth daily.  . tamsulosin (FLOMAX) 0.4 MG CAPS 0.4 mg daily. Take 0.4 mg by mouth once daily  . [DISCONTINUED] isosorbide mononitrate (IMDUR) 60 MG 24 hr tablet Take 1 tablet (60 mg total) by mouth  daily for 2 days.     Allergies:   Patient has no known allergies.   Social History   Tobacco Use  . Smoking status: Never Smoker  . Smokeless tobacco: Never Used  Substance Use Topics  . Alcohol use: Yes    Alcohol/week: 3.0 standard drinks    Types: 1 Glasses of wine, 1 Cans of beer, 1 Standard drinks or equivalent per week    Comment: \  . Drug use: No     Family Hx: [name] family history includes Cancer in his mother; Heart attack in his father; Heart disease in his father.  ROS:   Please see the history of present illness.    Review of Systems  Constitution: Positive for malaise/fatigue.  Cardiovascular: Positive for chest pain.  Respiratory: Positive for shortness of breath.    All other systems reviewed and are negative.   EKGs/Labs/Other Test Reviewed:    EKG:  EKG is   ordered today.  The ekg ordered today demonstrates AV paced, heart rate 60  Recent Labs: 04/15/2017: Hemoglobin 13.5; Platelets  166 04/30/2017: BUN 49; Creatinine, Ser 2.04; Potassium 5.0; Sodium 140   Recent Lipid Panel Lab Results  Component Value Date/Time   CHOL 159 02/12/2016 08:03 AM   TRIG 130 02/12/2016 08:03 AM   HDL 46 02/12/2016 08:03 AM   CHOLHDL 3.5 02/12/2016 08:03 AM   LDLCALC 87 02/12/2016 08:03 AM    Physical Exam:    VS:  BP 132/64   Pulse 60   Ht 5' 8.5" (1.74 m)   Wt 170 lb (77.1 kg)   BMI 25.47 kg/m     Wt Readings from Last 3 Encounters:  02/02/18 170 lb (77.1 kg)  09/02/17 165 lb (74.8 kg)  08/03/17 168 lb 14 oz (76.6 kg)     Physical Exam  Constitutional: He is oriented to person, place, and time. He appears well-developed and well-nourished. No distress.  HENT:  Head: Normocephalic and atraumatic.  Eyes: No scleral icterus.  Neck: No JVD present. No thyromegaly present.  Cardiovascular: Normal rate and regular rhythm.  Murmur heard.  Systolic murmur is present with a grade of 2/6 at the upper left sternal border. Pulmonary/Chest: Effort normal. He has no rales.  Abdominal: Soft.  Musculoskeletal: He exhibits no edema.  Lymphadenopathy:    He has no cervical adenopathy.  Neurological: He is alert and oriented to person, place, and time.  Skin: Skin is warm and dry.  Psychiatric: He has a normal mood and affect.    ASSESSMENT & PLAN:    Coronary artery disease involving native coronary artery of native heart with angina pectoris ([place]) Status post CABG in 1988 and redo bypass in 1996.  He has had multiple percutaneous coronary intervention procedures since that time.  He has stable angina pectoris.  Overall, his pattern recently has been stable on his current regimen.  He is trying to do more activity but has been somewhat limited by his angina.  He checked his blood pressure cuffs from home along with her mercury cuff today.  By the end of the interview, his blood pressure was 150s/70s.  I have recommended increasing his isosorbide to 90 mg daily.  If his blood pressure  continues to run high, we can certainly resume amlodipine.  -Increase isosorbide to 90 mg daily  -Aspirin, Plavix, metoprolol, ranolazine, rosuvastatin  -Follow-up 6-8 weeks  -Return sooner if symptoms worsen  Essential hypertension Blood pressure has been somewhat labile recently.  Question if his  blood pressure machine was reading incorrectly.  Increase isosorbide as noted.  If his blood pressure continues to run above target (130/80), consider resuming amlodipine.  CKD (chronic kidney disease) stage 4, GFR 15-29 ml/min (HCC) Continue follow-up with nephrology.  Hyperlipidemia, unspecified hyperlipidemia type   LDL in 8/19 was 35.  Continue current statin   Dispo:  Return in about 8 weeks (around 03/30/2018) for Routine Follow Up, w/ Dr. Burt Avila, or Richardson Dopp, PA-C.   Medication Adjustments/Labs and Tests Ordered: Current medicines are reviewed at length with the patient today.  Concerns regarding medicines are outlined above.  Tests Ordered: Orders Placed This Encounter  Procedures  . EKG 12-Lead   Medication Changes: Meds ordered this encounter  Medications  . isosorbide mononitrate (IMDUR) 60 MG 24 hr tablet    Sig: Take 1.5 tablets (90 mg total) by mouth daily.    Dispense:  135 tablet    Refill:  3    DOSE INCREASE    Signed, Richardson Dopp, PA-C  02/02/2018 4:44 PM    [facility] [place], [place],   [address] Phone: [phone]; Fax: [phone]

## 2018-02-02 NOTE — Patient Instructions (Signed)
Medication Instructions:  1. INCREASE IMDUR TO 90 MG DAILY; THIS WILL BE 1 AND 1/2 TABLETS DAILY OF THE 60 MG TABLET; RX HAS BEEN SENT TO [facility] If you need a refill on your cardiac medications before your next appointment, please call your pharmacy.   Lab work: NONE ORDERED TODAY If you have labs (blood work) drawn today and your tests are completely normal, you will receive your results only by: Marland Kitchen MyChart Message (if you have MyChart) OR . A paper copy in the mail If you have any lab test that is abnormal or we need to change your treatment, we will call you to review the results.  Testing/Procedures: NONE ORDERED TODAY  Follow-Up: At [facility], you and your health needs are our priority.  As part of our continuing mission to provide you with exceptional heart care, we have created designated Provider Care Teams.  These Care Teams include your primary Cardiologist (physician) and Advanced Practice Providers (APPs -  Physician Assistants and Nurse Practitioners) who all work together to provide you with the care you need, when you need it. You will need a follow up appointment in:  8 weeks.  Please call our office 2 months in advance to schedule this appointment.  You may see [name], MD or one of the following Advanced Practice Providers on your designated Care Team: Richardson Dopp, [place] 03/17/18 @ 9:15 AM  Vin Bhagat, PA-C . Daune Perch, NP  Any Other Special Instructions Will Be Listed Below (If Applicable). CALL IF BP IS RUNNING 130/80 OR HIGHER [phone]

## 2018-02-09 DIAGNOSIS — Z79899 Other long term (current) drug therapy: Secondary | ICD-10-CM | POA: Diagnosis not present

## 2018-02-09 DIAGNOSIS — I10 Essential (primary) hypertension: Secondary | ICD-10-CM | POA: Diagnosis not present

## 2018-02-09 DIAGNOSIS — Z6825 Body mass index (BMI) 25.0-25.9, adult: Secondary | ICD-10-CM | POA: Diagnosis not present

## 2018-02-22 MED ORDER — AMLODIPINE BESYLATE 5 MG PO TABS: 5.0000 mg | ORAL_TABLET | Freq: Every day | ORAL | 3 refills | Status: DC

## 2018-02-22 NOTE — Addendum Note (Signed)
Addended by: Harland German A on: 02/22/2018 05:50 PM   Modules accepted: Orders

## 2018-02-22 NOTE — Telephone Encounter (Signed)
Per Willeen Cass, RN pt's MY CHART message was taken to [place] and his nurse Everlean Alstrom. Per Willeen Cass, RN pt may need cath which will be taken care by Lenice Llamas, RN.

## 2018-02-22 NOTE — Telephone Encounter (Signed)
Spoke with patient. He has long hx of anginal symptoms, worse yesterday. No rest pain. Reviewed medications. Advised that he resume amlodipine 5 mg daily. He will call in 1 week to notify me if symptoms are better. He understands to seek immediate medical attention for rest pain. We discussed cardiac catheterization but he prefers trial of increased anti-anginal Rx first.  [name] 02/22/2018 5:47 PM

## 2018-02-22 NOTE — Telephone Encounter (Signed)
I will route this message to Triage for further evaluation by the Triage Nurse.

## 2018-03-01 DIAGNOSIS — C44319 Basal cell carcinoma of skin of other parts of face: Secondary | ICD-10-CM | POA: Diagnosis not present

## 2018-03-01 DIAGNOSIS — L570 Actinic keratosis: Secondary | ICD-10-CM | POA: Diagnosis not present

## 2018-03-01 DIAGNOSIS — D1801 Hemangioma of skin and subcutaneous tissue: Secondary | ICD-10-CM | POA: Diagnosis not present

## 2018-03-01 DIAGNOSIS — Z85828 Personal history of other malignant neoplasm of skin: Secondary | ICD-10-CM | POA: Diagnosis not present

## 2018-03-01 DIAGNOSIS — L814 Other melanin hyperpigmentation: Secondary | ICD-10-CM | POA: Diagnosis not present

## 2018-03-01 DIAGNOSIS — D225 Melanocytic nevi of trunk: Secondary | ICD-10-CM | POA: Diagnosis not present

## 2018-03-10 DIAGNOSIS — S60459A Superficial foreign body of unspecified finger, initial encounter: Secondary | ICD-10-CM | POA: Insufficient documentation

## 2018-03-15 ENCOUNTER — Other Ambulatory Visit: Payer: Self-pay | Admitting: Cardiovascular Disease

## 2018-03-15 DIAGNOSIS — I2510 Atherosclerotic heart disease of native coronary artery without angina pectoris: Secondary | ICD-10-CM

## 2018-03-15 DIAGNOSIS — I25119 Atherosclerotic heart disease of native coronary artery with unspecified angina pectoris: Secondary | ICD-10-CM

## 2018-03-15 DIAGNOSIS — Z9861 Coronary angioplasty status: Principal | ICD-10-CM

## 2018-03-16 DIAGNOSIS — I10 Essential (primary) hypertension: Secondary | ICD-10-CM | POA: Diagnosis not present

## 2018-03-16 DIAGNOSIS — N183 Chronic kidney disease, stage 3 (moderate): Secondary | ICD-10-CM | POA: Diagnosis not present

## 2018-03-16 DIAGNOSIS — N184 Chronic kidney disease, stage 4 (severe): Secondary | ICD-10-CM | POA: Insufficient documentation

## 2018-03-16 DIAGNOSIS — M329 Systemic lupus erythematosus, unspecified: Secondary | ICD-10-CM | POA: Diagnosis not present

## 2018-03-16 NOTE — Progress Notes (Signed)
Cardiology Office Note:    Date:  03/17/2018   ID:  Kyle Avila, DOB [birth year], Kyle Avila  PCP:  [name], MD  Cardiologist:  [name], MD  Electrophysiologist:  [name], MD  Nephrologist:  Dr. Marval Regal  Referring MD: [name], MD   Chief Complaint  Patient presents with  . Follow-up    CAD w/ angina    History of Present Illness:    Kyle Avila is a 72 y.o. male with coronary artery disease status post CABG in 1988 and redo bypass in 1996.  He has subsequently undergone multiple PCI procedures including drug-eluting stent x2 to the left circumflex in 2009, drug-eluting stent to the vein graft to the PDA 2016 and drug-eluting stent x2 to the vein graft to the PDA in 2018.  He also has carotid disease status post right carotid endarterectomy, complete heart block status post pacemaker, chronic kidney disease, hypertension and hyperlipidemia.  He was evaluated in 10/19 for angina limiting his daily activities and labile blood pressures.  His nitrates were adjusted.  He contacted the office a few weeks ago and spoke with Dr. Burt Knack.  He was having worsening angina.  His Amlodipine 5 mg QD was resumed.    Kyle Avila returns for follow-up on his coronary artery disease with angina.  He is here alone today.  He decided to resume amlodipine at 10 mg daily.  Since resuming this drug, he has not had significant episodes of angina.  He was able to walk this morning without taking sublingual nitroglycerin.  He does note shortness of breath with some types of activities.  He denies syncope or near syncope, paroxysmal nocturnal dyspnea, leg swelling, bleeding issues.  Prior CV studies:   The following studies were reviewed today:  Cardiac catheterization 04/14/2017 LM 100 LAD ostial 100, mid 87 at LIMA anastomosis, mid to distal 40 beyond graft insertion RI ostial 100 LCx stent patent with 20 ISR; OM1 100 RCA ostial 100-CTO SVG-RPDA proximal 60, distal  stent 90 ISR SVG-OM1 100 LIMA-LAD patent PCI: 4 x 16 mm Promus Premier DES to the proximal SVG-RPDA PCI: 4 x 24 mm Promus Premier DES to the distal SVG-RPDA        Echo 06/24/2016 Mild concentric LVH, EF 45-50, inferolateral/inferior/inferoseptal hypokinesis, grade 1 diastolic dysfunction, mild MR, mild LAE, mildly reduced RVSF, mild TR, PASP 32  LHC 11/03/14 LM: 100% LAD: Mid to dist 40%, 40% beyond graft insertion LCx: Ost to mid 20%, stent ok RCA: Ostial 100% S-RPDA dist 95% >>PCI: 4 x 15 mm Xience DES  S-OM occluded L-LAD ok  Final conclusion:  Severe native three-vessel coronary artery disease with total occlusion of the proximal LAD and total occlusion of the proximal RCA  Continued patency of the stented segment extending from the left mainstem into the left circumflex  Continued patency of the LIMA to LAD graft  Continued patency of the saphenous vein graft to right PDA, with severe stenosis in the distal body of the graft treated successfully with a drug-eluting stent platform using distal embolic protection Recommend continue dual antiplatelets therapy indefinitely with aspirin and clopidogrel. As long as kidney function is stable, anticipate hospital discharge tomorrow. [name] losartan has been held and will be resumed if kidney function is stable.  Myoview 02/2014 Intermediate risk stress nuclear study with a fixed medium-sized, severe basal to mid inferolateral and basal to mid anterolateral perfusion defect. This suggests prior infarction without significant ischemia. Study rated intermediate risk due to  low EF at 42%. LV Ejection Fraction: 42%. LV Wall Motion: Lateral and inferior hypokinesis.   Carotid US 07/2593 R CEA patent LICA < 63%  Past Medical History:  Diagnosis Date  . Arthritis    "some in my back" (04/14/2017)  . CAD (coronary artery disease)    s/p CABG twice, PCI twice  . Carotid artery occlusion    s/p CEA  . Chronic renal  insufficiency   . CKD (chronic kidney disease), stage III ([place])   . Exertional angina ([place]) 11/03/2014  . Gout   . Heart block    s/p PTVDP  . HTN (hypertension)   . Hx of cardiovascular stress test    Adenosine Myoview (11/15):  Inferolateral and anterolateral infarct without significant ischemia, EF 42% - Intermediate Risk  . Hyperlipidemia   . 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 Mitral insufficiency   . Myocardial infarction ([place])    "I've had some mild one" (04/14/2017)  . Presence of permanent cardiac pacemaker   . Skin cancer    "nose"  . VT (ventricular tachycardia)-nonsustained 05/20/2011   Surgical Hx: The patient  has a past surgical history that includes Coronary artery bypass graft; Pacemaker insertion (2007); Carotid endarterectomy (2002); Cardiac catheterization (N/A, 09/13/2014); Achilles tendon repair (Right); Cardiac catheterization; Cardiac catheterization (N/A, 11/03/2014); Cardiac catheterization (N/A, 11/03/2014); Coronary angioplasty with stent (04/14/2017); Tonsillectomy (1958); Mohs surgery; Cataract extraction w/ intraocular lens implant; Coronary angioplasty with stent (11/03/2014); and LEFT HEART CATH AND CORS/GRAFTS ANGIOGRAPHY (N/A, 04/14/2017).   Current Medications: Current Meds  Medication Sig  . acetaminophen (TYLENOL) 325 MG tablet Take 2 tablets (650 mg total) by mouth every 4 (four) hours as needed for headache or mild pain.  Marland Kitchen allopurinol (ZYLOPRIM) 100 MG tablet Take 100 mg by mouth daily.   Marland Kitchen amLODipine (NORVASC) 10 MG tablet Take 10 mg by mouth daily.  Marland Kitchen aspirin EC 81 MG EC tablet Take 81 mg by mouth daily.   . clopidogrel (PLAVIX) 75 MG tablet TAKE 1 TABLET EVERY DAY  . Evolocumab (REPATHA [place]) Inject 140 mg into the skin twice a month  . furosemide (LASIX) 40 MG tablet Take 1 tablet (40 mg total) by mouth daily.  . isosorbide mononitrate (IMDUR) 60 MG 24 hr tablet Take 1.5 tablets (90 mg total) by mouth  daily.  Marland Kitchen ketoconazole (NIZORAL) 2 % cream Apply 1 application topically 2 (two) times daily as needed for irritation (itching).   Marland Kitchen losartan (COZAAR) 100 MG tablet TAKE 1 TABLET TWICE DAILY  . metoprolol succinate (TOPROL-XL) 50 MG 24 hr tablet Take 1 tablet (50 mg total) by mouth 2 (two) times daily. Take with or immediately following meals.  . mupirocin ointment (BACTROBAN) 2 % Apply 1 application topically as needed.   . nitroGLYCERIN (NITROSTAT) 0.4 MG SL tablet Place 1 tablet (0.4 mg total) under the tongue every 5 (five) minutes as needed for chest pain.  . ranolazine (RANEXA) 1000 MG SR tablet TAKE 1 TABLET TWICE DAILY  . rosuvastatin (CRESTOR) 20 MG tablet TAKE 1 TABLET EVERY DAY  . tamsulosin (FLOMAX) 0.4 MG CAPS 0.4 mg daily. Take 0.4 mg by mouth once daily  . [DISCONTINUED] amLODipine (NORVASC) 5 MG tablet Take 1 tablet (5 mg total) by mouth daily. (Patient taking differently: Take 5 mg by mouth daily. Patient taking 10 mg tablet by month daily)  . [DISCONTINUED] REPATHA SURECLICK 875 MG/ML SOAJ      Allergies:  Patient has no known allergies.   Social History   Tobacco Use  . Smoking status: Never Smoker  . Smokeless tobacco: Never Used  Substance Use Topics  . Alcohol use: Yes    Alcohol/week: 3.0 standard drinks    Types: 1 Glasses of wine, 1 Cans of beer, 1 Standard drinks or equivalent per week    Comment: \  . Drug use: No     Family Hx: [name] family history includes Cancer in his mother; Heart attack in his father; Heart disease in his father.  ROS:   Please see the history of present illness.    Review of Systems  Cardiovascular: Positive for chest pain.   All other systems reviewed and are negative.   EKGs/Labs/Other Test Reviewed:    EKG:  EKG is not ordered today.    Recent Labs: 04/15/2017: Hemoglobin 13.5; Platelets 166 04/30/2017: BUN 49; Creatinine, Ser 2.04; Potassium 5.0; Sodium 140   Recent Lipid Panel Lab Results  Component Value  Date/Time   CHOL 159 02/12/2016 08:03 AM   TRIG 130 02/12/2016 08:03 AM   HDL 46 02/12/2016 08:03 AM   CHOLHDL 3.5 02/12/2016 08:03 AM   LDLCALC 87 02/12/2016 08:03 AM    Physical Exam:    VS:  BP (!) 112/42   Pulse 60   Ht 5\' 8"  (1.727 m)   Wt 172 lb 1.9 oz (78.1 kg)   SpO2 94%   BMI 26.17 kg/m     Wt Readings from Last 3 Encounters:  03/17/18 172 lb 1.9 oz (78.1 kg)  02/02/18 170 lb (77.1 kg)  09/02/17 165 lb (74.8 kg)     Physical Exam  Constitutional: He is oriented to person, place, and time. He appears well-developed and well-nourished. No distress.  HENT:  Head: Normocephalic and atraumatic.  Eyes: No scleral icterus.  Neck: No JVD present. Carotid bruit is not present. No thyromegaly present.  Cardiovascular: Normal rate and regular rhythm.  No murmur heard. Pulmonary/Chest: Effort normal. He has no rales.  Abdominal: Soft.  Musculoskeletal: He exhibits no edema.  Lymphadenopathy:    He has no cervical adenopathy.  Neurological: He is alert and oriented to person, place, and time.  Skin: Skin is warm and dry.  Psychiatric: He has a normal mood and affect.    ASSESSMENT & PLAN:    Coronary artery disease involving native coronary artery of native heart with angina pectoris ([place]) History of CABG in 1998, redo bypass in 1996 and multiple percutaneous coronary intervention stents.  He has recently had worsening angina.  His medications have been adjusted and amlodipine was recently resumed.  He is currently improved without significant symptoms.  Regimen which includes amlodipine, aspirin, Clopidogrel, isosorbide, ranolazine, rosuvastatin, evolocumab.  Essential hypertension [name] blood pressure is controlled on his current regimen.  Continue current therapy.   Mixed hyperlipidemia LDL optimal on most recent lab work.  Continue current Rx.    CKD (chronic kidney disease) stage 4, GFR 15-29 ml/min (HCC) Managed by nephrology.  Bilateral carotid  artery disease, unspecified type ([place]) I have recommended we obtain a follow-up carotid US.  He has not had a carotid US since 2015.  He declines at this time and will let us know when he is ready to proceed.   Dispo:  Return in about 3 months (around 06/17/2018) for Routine Follow Up, w/ Dr. Burt Knack.   Medication Adjustments/Labs and Tests Ordered: Current medicines are reviewed at length with the patient today.  Concerns regarding  medicines are outlined above.  Tests Ordered: No orders of the defined types were placed in this encounter.  Medication Changes: No orders of the defined types were placed in this encounter.   Signed, Richardson Dopp, PA-C  03/17/2018 9:53 AM    [facility] [address], [place], [place]  [address] Phone: [phone]; Fax: [phone]

## 2018-03-17 ENCOUNTER — Encounter: Payer: Self-pay | Admitting: Physician Assistant

## 2018-03-17 ENCOUNTER — Ambulatory Visit: Payer: Medicare HMO | Admitting: Physician Assistant

## 2018-03-17 VITALS — BP 112/42 | HR 60 | Ht 68.0 in | Wt 172.1 lb

## 2018-03-17 DIAGNOSIS — I739 Peripheral vascular disease, unspecified: Secondary | ICD-10-CM | POA: Diagnosis not present

## 2018-03-17 DIAGNOSIS — E782 Mixed hyperlipidemia: Secondary | ICD-10-CM

## 2018-03-17 DIAGNOSIS — I779 Disorder of arteries and arterioles, unspecified: Secondary | ICD-10-CM

## 2018-03-17 DIAGNOSIS — N184 Chronic kidney disease, stage 4 (severe): Secondary | ICD-10-CM

## 2018-03-17 DIAGNOSIS — I10 Essential (primary) hypertension: Secondary | ICD-10-CM

## 2018-03-17 DIAGNOSIS — I25119 Atherosclerotic heart disease of native coronary artery with unspecified angina pectoris: Secondary | ICD-10-CM

## 2018-03-17 NOTE — Patient Instructions (Signed)
Medication Instructions:  Your physician recommends that you continue on your current medications as directed. Please refer to the Current Medication list given to you today.  If you need a refill on your cardiac medications before your next appointment, please call your pharmacy.   Lab work: NON If you have labs (blood work) drawn today and your tests are completely normal, you will receive your results only by: Marland Kitchen MyChart Message (if you have MyChart) OR . A paper copy in the mail If you have any lab test that is abnormal or we need to change your treatment, we will call you to review the results.  Testing/Procedures: NONE  Follow-Up: At [facility], you and your health needs are our priority.  As part of our continuing mission to provide you with exceptional heart care, we have created designated Provider Care Teams.  These Care Teams include your primary Cardiologist (physician) and Advanced Practice Providers (APPs -  Physician Assistants and Nurse Practitioners) who all work together to provide you with the care you need, when you need it. You will need a follow up appointment in:  3 months.   You may see [name], MD or one of the following Advanced Practice Providers on your designated Care Team: Richardson Dopp, PA-C [place], [place] . Daune Perch, NP  Any Other Special Instructions Will Be Listed Below (If Applicable).

## 2018-03-23 ENCOUNTER — Other Ambulatory Visit: Payer: Self-pay | Admitting: Cardiovascular Disease

## 2018-03-23 DIAGNOSIS — I2510 Atherosclerotic heart disease of native coronary artery without angina pectoris: Secondary | ICD-10-CM

## 2018-03-23 DIAGNOSIS — N400 Enlarged prostate without lower urinary tract symptoms: Secondary | ICD-10-CM | POA: Diagnosis not present

## 2018-03-23 DIAGNOSIS — N184 Chronic kidney disease, stage 4 (severe): Secondary | ICD-10-CM | POA: Diagnosis not present

## 2018-03-23 DIAGNOSIS — R0609 Other forms of dyspnea: Secondary | ICD-10-CM | POA: Diagnosis not present

## 2018-03-23 DIAGNOSIS — Z9861 Coronary angioplasty status: Principal | ICD-10-CM

## 2018-03-23 DIAGNOSIS — J40 Bronchitis, not specified as acute or chronic: Secondary | ICD-10-CM | POA: Diagnosis not present

## 2018-03-23 DIAGNOSIS — M109 Gout, unspecified: Secondary | ICD-10-CM | POA: Diagnosis not present

## 2018-03-23 DIAGNOSIS — E7849 Other hyperlipidemia: Secondary | ICD-10-CM | POA: Diagnosis not present

## 2018-03-23 DIAGNOSIS — I25119 Atherosclerotic heart disease of native coronary artery with unspecified angina pectoris: Secondary | ICD-10-CM

## 2018-03-23 DIAGNOSIS — D126 Benign neoplasm of colon, unspecified: Secondary | ICD-10-CM | POA: Insufficient documentation

## 2018-03-23 DIAGNOSIS — I10 Essential (primary) hypertension: Secondary | ICD-10-CM | POA: Diagnosis not present

## 2018-03-23 DIAGNOSIS — I25118 Atherosclerotic heart disease of native coronary artery with other forms of angina pectoris: Secondary | ICD-10-CM | POA: Diagnosis not present

## 2018-03-25 DIAGNOSIS — Z87898 Personal history of other specified conditions: Secondary | ICD-10-CM | POA: Insufficient documentation

## 2018-03-29 ENCOUNTER — Telehealth: Payer: Self-pay

## 2018-03-29 ENCOUNTER — Ambulatory Visit (INDEPENDENT_AMBULATORY_CARE_PROVIDER_SITE_OTHER): Payer: Medicare HMO

## 2018-03-29 DIAGNOSIS — I442 Atrioventricular block, complete: Secondary | ICD-10-CM

## 2018-03-29 NOTE — Telephone Encounter (Signed)
Spoke with pt and reminded pt of remote transmission that is due today. Pt verbalized understanding.   

## 2018-03-30 NOTE — Progress Notes (Signed)
Remote pacemaker transmission.   

## 2018-04-26 LAB — CUP PACEART REMOTE DEVICE CHECK
Battery Impedance: 206 Ohm
Brady Statistic AP VP Percent: 50 %
Brady Statistic AP VS Percent: 0 %
Brady Statistic AS VS Percent: 0 %
Date Time Interrogation Session: 20191202183043
Implantable Lead Implant Date: 20070831
Implantable Lead Model: 4469
Implantable Lead Serial Number: 485949
Implantable Lead Serial Number: 553665
Lead Channel Impedance Value: 481 Ohm
Lead Channel Impedance Value: 495 Ohm
Lead Channel Pacing Threshold Amplitude: 0.625 V
Lead Channel Pacing Threshold Pulse Width: 0.4 ms
Lead Channel Setting Pacing Amplitude: 2 V
Lead Channel Setting Pacing Amplitude: 2.5 V
Lead Channel Setting Sensing Sensitivity: 2.8 mV
MDC IDC LEAD IMPLANT DT: 20070831
MDC IDC LEAD LOCATION: 753859
MDC IDC LEAD LOCATION: 753860
MDC IDC MSMT BATTERY REMAINING LONGEVITY: 110 mo
MDC IDC MSMT BATTERY VOLTAGE: 2.79 V
MDC IDC MSMT LEADCHNL RA PACING THRESHOLD AMPLITUDE: 0.625 V
MDC IDC MSMT LEADCHNL RV PACING THRESHOLD PULSEWIDTH: 0.4 ms
MDC IDC PG IMPLANT DT: 20160518
MDC IDC SET LEADCHNL RV PACING PULSEWIDTH: 0.4 ms
MDC IDC STAT BRADY AS VP PERCENT: 50 %

## 2018-05-19 DIAGNOSIS — I872 Venous insufficiency (chronic) (peripheral): Secondary | ICD-10-CM | POA: Insufficient documentation

## 2018-05-19 DIAGNOSIS — Z6826 Body mass index (BMI) 26.0-26.9, adult: Secondary | ICD-10-CM | POA: Diagnosis not present

## 2018-05-19 DIAGNOSIS — I10 Essential (primary) hypertension: Secondary | ICD-10-CM | POA: Diagnosis not present

## 2018-05-19 DIAGNOSIS — N184 Chronic kidney disease, stage 4 (severe): Secondary | ICD-10-CM | POA: Diagnosis not present

## 2018-05-19 DIAGNOSIS — I739 Peripheral vascular disease, unspecified: Secondary | ICD-10-CM | POA: Diagnosis not present

## 2018-05-19 DIAGNOSIS — I25118 Atherosclerotic heart disease of native coronary artery with other forms of angina pectoris: Secondary | ICD-10-CM | POA: Diagnosis not present

## 2018-05-27 DIAGNOSIS — E785 Hyperlipidemia, unspecified: Secondary | ICD-10-CM | POA: Diagnosis not present

## 2018-05-27 DIAGNOSIS — M329 Systemic lupus erythematosus, unspecified: Secondary | ICD-10-CM | POA: Diagnosis not present

## 2018-05-27 DIAGNOSIS — M109 Gout, unspecified: Secondary | ICD-10-CM | POA: Diagnosis not present

## 2018-05-27 DIAGNOSIS — R809 Proteinuria, unspecified: Secondary | ICD-10-CM | POA: Diagnosis not present

## 2018-05-27 DIAGNOSIS — N183 Chronic kidney disease, stage 3 (moderate): Secondary | ICD-10-CM | POA: Diagnosis not present

## 2018-05-27 DIAGNOSIS — I129 Hypertensive chronic kidney disease with stage 1 through stage 4 chronic kidney disease, or unspecified chronic kidney disease: Secondary | ICD-10-CM | POA: Diagnosis not present

## 2018-05-27 DIAGNOSIS — D696 Thrombocytopenia, unspecified: Secondary | ICD-10-CM | POA: Diagnosis not present

## 2018-06-23 ENCOUNTER — Encounter: Payer: Self-pay | Admitting: Internal Medicine

## 2018-06-23 ENCOUNTER — Ambulatory Visit: Payer: Medicare HMO | Admitting: Internal Medicine

## 2018-06-23 VITALS — BP 118/64 | HR 60 | Ht 68.0 in | Wt 171.4 lb

## 2018-06-23 DIAGNOSIS — I495 Sick sinus syndrome: Secondary | ICD-10-CM

## 2018-06-23 DIAGNOSIS — I493 Ventricular premature depolarization: Secondary | ICD-10-CM | POA: Diagnosis not present

## 2018-06-23 DIAGNOSIS — I10 Essential (primary) hypertension: Secondary | ICD-10-CM | POA: Diagnosis not present

## 2018-06-23 DIAGNOSIS — L820 Inflamed seborrheic keratosis: Secondary | ICD-10-CM | POA: Diagnosis not present

## 2018-06-23 DIAGNOSIS — I255 Ischemic cardiomyopathy: Secondary | ICD-10-CM

## 2018-06-23 DIAGNOSIS — I442 Atrioventricular block, complete: Secondary | ICD-10-CM | POA: Diagnosis not present

## 2018-06-23 DIAGNOSIS — Z950 Presence of cardiac pacemaker: Secondary | ICD-10-CM

## 2018-06-23 DIAGNOSIS — Z85828 Personal history of other malignant neoplasm of skin: Secondary | ICD-10-CM | POA: Diagnosis not present

## 2018-06-23 NOTE — Progress Notes (Addendum)
Patient Care Team: [name], MD as PCP - General (Internal Medicine) [name], MD as PCP - Cardiology (Cardiology) Deboraha Sprang, MD as PCP - Electrophysiology (Cardiology) Fleet Contras, MD as Consulting Physician (Nephrology) Deboraha Sprang, MD (Cardiology)   HPI  Kyle Avila is a 73 y.o. 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  His device reached ERI and he underwent generator replacement 5/16   He has a complex cardiovascular history with bypass surgery and redo bypass in 96 and revascularization with stenting of the circumflex in 2009. At that time he had total LAD and RCA disease; his left ventricular function then was okay.    DATE TEST EF   11/15 Myoview  42 % Anterolateral defect  2/18 Echo   45-50 %   12/18 Cath   S/P CABG patency  LIMA-LAD and moderate stenosis at   anastomosis Stenosis of the SVG-PDA- DESX2  implantation In-stent restenosis and de-novo mid vessel disease         He is to stay on long-term dual therapy per Dr. Billee Cashing   He has had problems with exercise intolerance.  Since starting on amlodipine, he has had scant chest pain.  No edema.  No syncope.   Past Medical History:  Diagnosis Date  . Arthritis    "some in my back" (04/14/2017)  . CAD (coronary artery disease)    s/p CABG twice, PCI twice  . Carotid artery occlusion    s/p CEA  . Chronic renal insufficiency   . CKD (chronic kidney disease), stage III ([place])   . Exertional angina ([place]) 11/03/2014  . Gout   . Heart block    s/p PTVDP  . HTN (hypertension)   . Hx of cardiovascular stress test    Adenosine Myoview (11/15):  Inferolateral and anterolateral infarct without significant ischemia, EF 42% - Intermediate Risk  . Hyperlipidemia   . 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 Mitral insufficiency   . Myocardial infarction ([place])    "I've had some mild one" (04/14/2017)  .  Presence of permanent cardiac pacemaker   . Skin cancer    "nose"  . 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 ACHILLES TENDON REPAIR Right   . CARDIAC CATHETERIZATION    . CARDIAC CATHETERIZATION N/A 11/03/2014   Procedure: Left Heart Cath and Cors/Grafts Angiography;  Surgeon: [name], MD;  Location: [facility];  Service: Cardiovascular;  Laterality: N/A;  . CARDIAC CATHETERIZATION N/A 11/03/2014   Procedure: Coronary Stent Intervention;  Surgeon: [name], MD;  Location: [facility];  Service: Cardiovascular;  Laterality: N/A;  . CAROTID ENDARTERECTOMY  2002   "? side"  . CATARACT EXTRACTION W/ INTRAOCULAR LENS IMPLANT     "? side"  . CORONARY ANGIOPLASTY WITH STENT PLACEMENT  04/14/2017  . CORONARY ANGIOPLASTY WITH STENT PLACEMENT  11/03/2014   SVG PVA  . CORONARY ARTERY BYPASS GRAFT     with redo cabg  . EP IMPLANTABLE DEVICE N/A 09/13/2014   Procedure: PPM Generator Changeout;  Surgeon: Deboraha Sprang, MD;  Location: [facility];  Service: Cardiovascular;  Laterality: N/A;  . LEFT HEART CATH AND CORS/GRAFTS ANGIOGRAPHY N/A 04/14/2017   Procedure: LEFT HEART CATH AND CORS/GRAFTS ANGIOGRAPHY;  Surgeon: [name], MD;  Location: [facility];  Service: Cardiovascular;  Laterality: N/A;  . MOHS SURGERY     "just outside my nose"  . PACEMAKER INSERTION  2007  . TONSILLECTOMY  1958    Current Outpatient Medications  Medication Sig Dispense Refill  . allopurinol (ZYLOPRIM) 100 MG tablet Take 100 mg by mouth daily.     Marland Kitchen amLODipine (NORVASC) 10 MG tablet Take 10 mg by mouth daily.    Marland Kitchen aspirin EC 81 MG EC tablet Take 81 mg by mouth daily.  1 tablet 0  . clopidogrel (PLAVIX) 75 MG tablet Take 75 mg by mouth daily.    . Evolocumab (REPATHA [place]) Inject 140 mg into the skin twice a month    . furosemide (LASIX) 40 MG tablet Take 1 tablet (40 mg total) by mouth daily. 30 tablet    . isosorbide mononitrate (IMDUR) 60 MG 24 hr tablet Take 1 tablet by mouth daily.    Marland Kitchen losartan (COZAAR) 100 MG tablet Take 100 mg by mouth 2 (two) times daily.    . metoprolol succinate (TOPROL-XL) 50 MG 24 hr tablet Take 1 tablet (50 mg total) by mouth 2 (two) times daily. Take with or immediately following meals. 180 tablet 2  . nitroGLYCERIN (NITROSTAT) 0.4 MG SL tablet Place 1 tablet (0.4 mg total) under the tongue every 5 (five) minutes as needed for chest pain. 25 tablet 6  . ranolazine (RANEXA) 500 MG 12 hr tablet Take 500 mg by mouth 2 (two) times daily.    . rosuvastatin (CRESTOR) 20 MG tablet Take 20 mg by mouth daily.    . tamsulosin (FLOMAX) 0.4 MG CAPS 0.4 mg daily. Take 0.4 mg by mouth once daily     No current facility-administered medications for this visit.     No Known Allergies  Review of Systems negative except from HPI and PMH  Physical Exam BP 118/64   Pulse 60   Ht 5\' 8"  (1.727 m)   Wt 171 lb 6.4 oz (77.7 kg)   SpO2 95%   BMI 26.06 kg/m    Well developed and nourished in no acute distress HENT normal Neck supple with JVP-flat Clear Regular rate and rhythm, no murmurs or gallops Abd-soft with active BS No Clubbing cyanosis edema Skin-warm and dry A & Oriented  Grossly normal sensory and motor function   ECG AV pacing at 60  Assessment and  Plan  Ischemic cardiomyopathy with prior MI  Hypertension   Pacemaker-[employer] [name] device was interrogated.  The information was reviewed. No changes were made in the programming.   After I had written this, he decided that we should go ahead and try the reprogramming of rate response as outline below   Complete heart block device dependent  PVCs  Sinus node dysfunction  Scant chest pain since introduction of amlodipine.    Clearly chronotropic incompetent with just 2.5% of heartbeats faster than 80 bpm.  We discussed again the potential incursion into his ischemic threshold by increasing his  resting rates.  He is going skiing next week; we have agreed to activate rate response and he will let us know over the next 48 hours whether he has more chest pain at the gym.  There may be more problems at altitude.  He is aware of this.  BP well controlled      We spent more than 50% of our >25 min visit in face to face counseling regarding the above

## 2018-06-23 NOTE — Patient Instructions (Signed)

## 2018-06-24 LAB — CUP PACEART INCLINIC DEVICE CHECK
Date Time Interrogation Session: 20200227080834
Implantable Lead Implant Date: 20070831
Implantable Lead Implant Date: 20070831
Implantable Lead Location: 753859
Implantable Lead Model: 4469
Implantable Lead Model: 4470
Implantable Lead Serial Number: 485949
Implantable Lead Serial Number: 553665
Implantable Pulse Generator Implant Date: 20160518
MDC IDC LEAD LOCATION: 753860

## 2018-07-15 MED ORDER — AMLODIPINE BESYLATE 10 MG PO TABS: 10.0000 mg | ORAL_TABLET | Freq: Every day | ORAL | 0 refills | Status: DC

## 2018-07-15 MED ORDER — LOSARTAN POTASSIUM 100 MG PO TABS: 100.0000 mg | ORAL_TABLET | Freq: Two times a day (BID) | ORAL | 0 refills | Status: DC

## 2018-07-15 MED ORDER — CLOPIDOGREL BISULFATE 75 MG PO TABS: 75.0000 mg | ORAL_TABLET | Freq: Every day | ORAL | 0 refills | Status: DC

## 2018-08-17 DIAGNOSIS — R05 Cough: Secondary | ICD-10-CM | POA: Diagnosis not present

## 2018-08-17 DIAGNOSIS — J302 Other seasonal allergic rhinitis: Secondary | ICD-10-CM | POA: Diagnosis not present

## 2018-08-31 DIAGNOSIS — N183 Chronic kidney disease, stage 3 (moderate): Secondary | ICD-10-CM | POA: Diagnosis not present

## 2018-08-31 DIAGNOSIS — M329 Systemic lupus erythematosus, unspecified: Secondary | ICD-10-CM | POA: Diagnosis not present

## 2018-09-08 ENCOUNTER — Telehealth: Payer: Self-pay | Admitting: Cardiovascular Disease

## 2018-09-08 NOTE — Telephone Encounter (Signed)
Left message on machine for Mr Kyle Avila to call back about switching appt to virtual visit and obtaining consent and doing pre-appt call - please put pt to my extension at office

## 2018-09-09 NOTE — Telephone Encounter (Signed)
Patient set up for MyChart? Consent sent through My chart  Is patient using Smartphone/computer/tablet? smartphone /ipad -Patient states that Operator told him that he could use Ipad for a Mychart visit/ I told patient that Dr Burt Knack is usng Doximity and it goes through his smart phone. He wants to use mychart and I Pad  Did audio/video work?  Does patient need telephone visit? maybe  Best phone number to use? [phone]  Special Instructions? Patient was told to have vitals ready prior to appt      Virtual Visit Pre-Appointment Phone Call  "(Name), I am calling you today to discuss your upcoming appointment. We are currently trying to limit exposure to the virus that causes COVID-19 by seeing patients at home rather than in the office."  1. "What is the BEST phone number to call the day of the visit?" - include this in appointment notes  2. Do you have or have access to (through a family member/friend) a smartphone with video capability that we can use for your visit?" a. If yes - list this number in appt notes as cell (if different from BEST phone #) and list the appointment type as a VIDEO visit in appointment notes b. If no - list the appointment type as a PHONE visit in appointment notes  3. Confirm consent - "In the setting of the current Covid19 crisis, you are scheduled for a (phone or video) visit with your provider on (date) at (time).  Just as we do with many in-office visits, in order for you to participate in this visit, we must obtain consent.  If you'd like, I can send this to your mychart (if signed up) or email for you to review.  Otherwise, I can obtain your verbal consent now.  All virtual visits are billed to your insurance company just like a normal visit would be.  By agreeing to a virtual visit, we'd like you to understand that the technology does not allow for your provider to perform an examination, and thus may limit your provider's ability to fully assess  your condition. If your provider identifies any concerns that need to be evaluated in person, we will make arrangements to do so.  Finally, though the technology is pretty good, we cannot assure that it will always work on either your or our end, and in the setting of a video visit, we may have to convert it to a phone-only visit.  In either situation, we cannot ensure that we have a secure connection.  Are you willing to proceed?" STAFF: Did the patient verbally acknowledge consent to telehealth visit? Document YES/NO here:  yes  4. Advise patient to be prepared - "Two hours prior to your appointment, go ahead and check your blood pressure, pulse, oxygen saturation, and your weight (if you have the equipment to check those) and write them all down. When your visit starts, your provider will ask you for this information. If you have an Apple Watch or Kardia device, please plan to have heart rate information ready on the day of your appointment. Please have a pen and paper handy nearby the day of the visit as well."  5. Give patient instructions for MyChart download to smartphone OR Doximity/Doxy.me as below if video visit (depending on what platform provider is using)  6. Inform patient they will receive a phone call 15 minutes prior to their appointment time (may be from unknown caller ID) so they should be prepared to answer    TELEPHONE  CALL NOTE  Kyle Avila has been deemed a candidate for a follow-up tele-health visit to limit community exposure during the Covid-19 pandemic. I spoke with the patient via phone to ensure availability of phone/video source, confirm preferred email & phone number, and discuss instructions and expectations.  I reminded Kyle Avila to be prepared with any vital sign and/or heart rhythm information that could potentially be obtained via home monitoring, at the time of his visit. I reminded Kyle Avila to expect a phone call prior to his visit.  Kyle Avila 09/09/2018 11:25 AM   INSTRUCTIONS FOR DOWNLOADING THE MYCHART APP TO SMARTPHONE  - The patient must first make sure to have activated MyChart and know their login information - If Apple, go to [employer] and type in MyChart in the search bar and download the app. If Android, ask patient to go to [employer] and type in [place] in the search bar and download the app. The app is free but as with any other app downloads, their phone may require them to verify saved payment information or Apple/Android password.  - The patient will need to then log into the app with their MyChart username and password, and select [facility] as their healthcare provider to link the account. When it is time for your visit, go to the MyChart app, find appointments, and click Begin Video Visit. Be sure to Select Allow for your device to access the Microphone and Camera for your visit. You will then be connected, and your provider will be with you shortly.  **If they have any issues connecting, or need assistance please contact MyChart service desk [phone])**  **If using a computer, in order to ensure the best quality for their visit they will need to use either of the following Internet Browsers: [employer], or Google Chrome**  IF USING DOXIMITY or DOXY.ME - The patient will receive a link just prior to their visit by text.     FULL LENGTH CONSENT FOR TELE-HEALTH VISIT   I hereby voluntarily request, consent and authorize [facility] and its employed or contracted physicians, physician assistants, nurse practitioners or other licensed health care professionals (the Practitioner), to provide me with telemedicine health care services (the Services") as deemed necessary by the treating Practitioner. I acknowledge and consent to receive the Services by the Practitioner via telemedicine. I understand that the telemedicine visit will involve communicating with the Practitioner through  live audiovisual communication technology and the disclosure of certain medical information by electronic transmission. I acknowledge that I have been given the opportunity to request an in-person assessment or other available alternative prior to the telemedicine visit and am voluntarily participating in the telemedicine visit.  I understand that I have the right to withhold or withdraw my consent to the use of telemedicine in the course of my care at any time, without affecting my right to future care or treatment, and that the Practitioner or I may terminate the telemedicine visit at any time. I understand that I have the right to inspect all information obtained and/or recorded in the course of the telemedicine visit and may receive copies of available information for a reasonable fee.  I understand that some of the potential risks of receiving the Services via telemedicine include:   Delay or interruption in medical evaluation due to technological equipment failure or disruption;  Information transmitted may not be sufficient (e.g. poor resolution of images) to allow for appropriate medical decision  making by the Practitioner; and/or   In rare instances, security protocols could fail, causing a breach of personal health information.  Furthermore, I acknowledge that it is my responsibility to provide information about my medical history, conditions and care that is complete and accurate to the best of my ability. I acknowledge that Practitioner's advice, recommendations, and/or decision may be based on factors not within their control, such as incomplete or inaccurate data provided by me or distortions of diagnostic images or specimens that may result from electronic transmissions. I understand that the practice of medicine is not an exact science and that Practitioner makes no warranties or guarantees regarding treatment outcomes. I acknowledge that I will receive a copy of this consent concurrently  upon execution via email to the email address I last provided but may also request a printed copy by calling the office of [facility].    I understand that my insurance will be billed for this visit.   I have read or had this consent read to me.  I understand the contents of this consent, which adequately explains the benefits and risks of the Services being provided via telemedicine.   I have been provided ample opportunity to ask questions regarding this consent and the Services and have had my questions answered to my satisfaction.  I give my informed consent for the services to be provided through the use of telemedicine in my medical care  By participating in this telemedicine visit I agree to the above.

## 2018-09-13 ENCOUNTER — Encounter: Payer: Self-pay | Admitting: Cardiovascular Disease

## 2018-09-13 ENCOUNTER — Other Ambulatory Visit: Payer: Self-pay

## 2018-09-13 ENCOUNTER — Telehealth (INDEPENDENT_AMBULATORY_CARE_PROVIDER_SITE_OTHER): Payer: Medicare HMO | Admitting: Cardiovascular Disease

## 2018-09-13 VITALS — BP 125/57 | Ht 68.0 in

## 2018-09-13 DIAGNOSIS — I10 Essential (primary) hypertension: Secondary | ICD-10-CM

## 2018-09-13 DIAGNOSIS — N184 Chronic kidney disease, stage 4 (severe): Secondary | ICD-10-CM

## 2018-09-13 DIAGNOSIS — I25119 Atherosclerotic heart disease of native coronary artery with unspecified angina pectoris: Secondary | ICD-10-CM | POA: Diagnosis not present

## 2018-09-13 DIAGNOSIS — E782 Mixed hyperlipidemia: Secondary | ICD-10-CM

## 2018-09-13 NOTE — Progress Notes (Signed)
Virtual Visit via Video Note   This visit type was conducted due to national recommendations for restrictions regarding the COVID-19 Pandemic (e.g. social distancing) in an effort to limit this patient's exposure and mitigate transmission in our community.  Due to his co-morbid illnesses, this patient is at least at moderate risk for complications without adequate follow up.  This format is felt to be most appropriate for this patient at this time.  All issues noted in this document were discussed and addressed.  A limited physical exam was performed with this format.  Please refer to the patient's chart for his consent to telehealth for [facility].   Date:  09/13/2018   ID:  Kyle Avila, DOB 05-06-1945, MRN [id number]  Patient Location: Home Provider Location: Home  PCP:  [name], MD  Cardiologist:  [name], MD  Electrophysiologist:  [name], MD   Evaluation Performed:  Follow-Up Visit  Chief Complaint:  FU CAD  History of Present Illness:    Kyle Avila is a 73 y.o. male with hx of CAD and chronic angina, presenting for follow-up evaluation.  This visit is done via video conferencing technology in light of the current COVID-19 pandemic.  The patient has an extensive coronary history with initial bypass surgery in 1988 followed by redo CABG in 1996.  He has undergone multiple PCI procedures over the years following his CABG.  His last PCI procedure in 2018 involved stenting of the saphenous vein graft to PDA.  He also has carotid disease status post right carotid endarterectomy, complete heart block status post pacemaker, chronic kidney disease, hypertension and hyperlipidemia.   The patient does not have symptoms concerning for COVID-19 infection (fever, chills, cough, or new shortness of breath).   The patient is doing well today.  He has not had any recent symptoms of angina at rest or with exertion.  He has been staying active and denies shortness of  breath, edema, orthopnea, PND, or heart palpitations.  He continues with regular nephrology and primary care follow-up.  He went skiing in [place] this winter and did well from a cardiac perspective.  He has felt better since the rate responsiveness of his pacemaker has been adjusted.  He is compliant with his medications.  He has a lot of questions around coronavirus care and how we are handling this in our community.  We talked at length about our [place] and specifics around what care of a cardiac patient with coronavirus might look like.   Past Medical History:  Diagnosis Date  . Arthritis    "some in my back" (04/14/2017)  . CAD (coronary artery disease)    s/p CABG twice, PCI twice  . Carotid artery occlusion    s/p CEA  . Chronic renal insufficiency   . CKD (chronic kidney disease), stage III ([address])   . Exertional angina ([place]) 11/03/2014  . Gout   . Heart block    s/p PTVDP  . HTN (hypertension)   . Hx of cardiovascular stress test    Adenosine Myoview (11/15):  Inferolateral and anterolateral infarct without significant ischemia, EF 42% - Intermediate Risk  . Hyperlipidemia   . 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 Mitral insufficiency   . Myocardial infarction ([place])    "I've had some mild one" (04/14/2017)  . Presence of permanent cardiac pacemaker   . Skin cancer    "nose"  . VT (ventricular  tachycardia)-nonsustained 05/20/2011   Past Surgical History:  Procedure Laterality Date  . ACHILLES TENDON REPAIR Right   . CARDIAC CATHETERIZATION    . CARDIAC CATHETERIZATION N/A 11/03/2014   Procedure: Left Heart Cath and Cors/Grafts Angiography;  Surgeon: [name], MD;  Location: [facility];  Service: Cardiovascular;  Laterality: N/A;  . CARDIAC CATHETERIZATION N/A 11/03/2014   Procedure: Coronary Stent Intervention;  Surgeon: [name], MD;  Location: [facility];  Service: Cardiovascular;   Laterality: N/A;  . CAROTID ENDARTERECTOMY  2002   "? side"  . CATARACT EXTRACTION W/ INTRAOCULAR LENS IMPLANT     "? side"  . CORONARY ANGIOPLASTY WITH STENT PLACEMENT  04/14/2017  . CORONARY ANGIOPLASTY WITH STENT PLACEMENT  11/03/2014   SVG PVA  . CORONARY ARTERY BYPASS GRAFT     with redo cabg  . EP IMPLANTABLE DEVICE N/A 09/13/2014   Procedure: PPM Generator Changeout;  Surgeon: Deboraha Sprang, MD;  Location: [facility];  Service: Cardiovascular;  Laterality: N/A;  . LEFT HEART CATH AND CORS/GRAFTS ANGIOGRAPHY N/A 04/14/2017   Procedure: LEFT HEART CATH AND CORS/GRAFTS ANGIOGRAPHY;  Surgeon: [name], MD;  Location: [facility];  Service: Cardiovascular;  Laterality: N/A;  . MOHS SURGERY     "just outside my nose"  . PACEMAKER INSERTION  2007  . TONSILLECTOMY  1958     Current Meds  Medication Sig  . allopurinol (ZYLOPRIM) 100 MG tablet Take 100 mg by mouth daily.   Marland Kitchen amLODipine (NORVASC) 10 MG tablet Take 1 tablet (10 mg total) by mouth daily.  Marland Kitchen aspirin EC 81 MG EC tablet Take 81 mg by mouth daily.   . clopidogrel (PLAVIX) 75 MG tablet Take 1 tablet (75 mg total) by mouth daily.  . Evolocumab (REPATHA [facility]) Inject 140 mg into the skin twice a month  . furosemide (LASIX) 40 MG tablet Take 1 tablet (40 mg total) by mouth daily.  . isosorbide mononitrate (IMDUR) 60 MG 24 hr tablet Take 1 tablet by mouth daily.  Marland Kitchen losartan (COZAAR) 100 MG tablet Take 1 tablet (100 mg total) by mouth 2 (two) times daily.  . metoprolol succinate (TOPROL-XL) 50 MG 24 hr tablet Take 1 tablet (50 mg total) by mouth 2 (two) times daily. Take with or immediately following meals.  . nitroGLYCERIN (NITROSTAT) 0.4 MG SL tablet Place 1 tablet (0.4 mg total) under the tongue every 5 (five) minutes as needed for chest pain.  . pantoprazole (PROTONIX) 40 MG tablet Take 40 mg by mouth daily.  . ranolazine (RANEXA) 1000 MG SR tablet Take 1,000 mg by mouth 2 (two) times daily.  . rosuvastatin  (CRESTOR) 20 MG tablet Take 20 mg by mouth daily.  . tamsulosin (FLOMAX) 0.4 MG CAPS Take 0.4 mg by mouth 2 (two) times a day. Take 0.4 mg by mouth once daily     Allergies:   Patient has no known allergies.   Social History   Tobacco Use  . Smoking status: Never Smoker  . Smokeless tobacco: Never Used  Substance Use Topics  . Alcohol use: Yes    Alcohol/week: 3.0 standard drinks    Types: 1 Glasses of wine, 1 Cans of beer, 1 Standard drinks or equivalent per week    Comment: \  . Drug use: No     Family Hx: [name] family history includes Cancer in his mother; Heart attack in his father; Heart disease in his father.  ROS:   Please see  the history of present illness.    All other systems reviewed and are negative.   Prior CV studies:   The following studies were reviewed today:  Cardiac catheterization 04/14/2017 LM 100 LAD ostial 100, mid 77 at LIMA anastomosis, mid to distal 40 beyond graft insertion RI ostial 100 LCx stent patent with 20 ISR; OM1 100 RCA ostial 100-CTO SVG-RPDA proximal 60, distal stent 90 ISR SVG-OM1 100 LIMA-LAD patent PCI: 4 x 16 mm Promus Premier DES to the proximal SVG-RPDA PCI: 4 x 24 mm Promus Premier DES to the distal SVG-RPDA   Echo 06/24/2016 Mild concentric LVH, EF 45-50, inferolateral/inferior/inferoseptal hypokinesis, grade 1 diastolic dysfunction, mild MR, mild LAE, mildly reduced RVSF, mild TR, PASP 32  LHC 11/03/14 LM: 100% LAD: Mid to dist 40%, 40% beyond graft insertion LCx: Ost to mid 20%, stent ok RCA: Ostial 100% S-RPDA dist 95% >>PCI: 4 x 15 mm Xience DES  S-OM occluded L-LAD ok  Final conclusion:  Severe native three-vessel coronary artery disease with total occlusion of the proximal LAD and total occlusion of the proximal RCA  Continued patency of the stented segment extending from the left mainstem into the left circumflex  Continued patency of the LIMA to LAD graft  Continued patency of the  saphenous vein graft to right PDA, with severe stenosis in the distal body of the graft treated successfully with a drug-eluting stent platform using distal embolic protection Recommend continue dual antiplatelets therapy indefinitely with aspirin and clopidogrel. As long as kidney function is stable, anticipate hospital discharge tomorrow. [name] losartan has been held and will be resumed if kidney function is stable.  Myoview 02/2014 Intermediate risk stress nuclear study with a fixed medium-sized, severe basal to mid inferolateral and basal to mid anterolateral perfusion defect. This suggests prior infarction without significant ischemia. Study rated intermediate risk due to low EF at 42%. LV Ejection Fraction: 42%. LV Wall Motion: Lateral and inferior hypokinesis.   Carotid US 10/2092 R CEA patent LICA < 70%  Labs/Other Tests and Data Reviewed:    EKG:  No ECG reviewed.  Recent Labs: No results found for requested labs within last 8760 hours.   Recent Lipid Panel Lab Results  Component Value Date/Time   CHOL 159 02/12/2016 08:03 AM   TRIG 130 02/12/2016 08:03 AM   HDL 46 02/12/2016 08:03 AM   CHOLHDL 3.5 02/12/2016 08:03 AM   LDLCALC 87 02/12/2016 08:03 AM    Wt Readings from Last 3 Encounters:  06/23/18 171 lb 6.4 oz (77.7 kg)  03/17/18 172 lb 1.9 oz (78.1 kg)  02/02/18 170 lb (77.1 kg)     Objective:    Vital Signs:  BP (!) 125/57   Ht 5\' 8"  (1.727 m)   BMI 26.06 kg/m    VITAL SIGNS:  reviewed GEN:  no acute distress Remaining portions of the exam not performed today as this is a telemedicine visit via video conferencing  ASSESSMENT & PLAN:    1. Coronary artery disease, native vessel, with angina: The patient appears stable with a good functional capacity.  He is on an aggressive antianginal regimen outlined above.  He remains on long-term dual antiplatelet therapy in the context of degenerated saphenous vein graft disease and multiple PCI procedures.   I did not make any changes to his medical regimen today. 2. 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  Followed by nephrology and primary care closely.  He is on appropriate medications. 3. Mixed hyperlipidemia: Patient is  treated with a high intensity statin drug and Repatha. Lipids in ideal range.   COVID-19 Education: The signs and symptoms of COVID-19 were discussed with the patient and how to seek care for testing (follow up with PCP or arrange E-visit).  The importance of social distancing was discussed today.  Time:   Today, I have spent 15 minutes with the patient with telehealth technology discussing the above problems.     Medication Adjustments/Labs and Tests Ordered: Current medicines are reviewed at length with the patient today.  Concerns regarding medicines are outlined above.   Tests Ordered: No orders of the defined types were placed in this encounter.   Medication Changes: No orders of the defined types were placed in this encounter.   Disposition:  Follow up in 6 month(s)   Signed, [name], MD  09/13/2018 8:44 AM    [place]

## 2018-09-13 NOTE — Patient Instructions (Signed)
Medication Instructions:  Your provider recommends that you continue on your current medications as directed. Please refer to the Current Medication list given to you today.    Labwork: None  Testing/Procedures: None  Follow-Up: Your provider wants you to follow-up in: 6 months. You will receive a reminder letter in the mail two months in advance. If you don't receive a letter, please call our office to schedule the follow-up appointment.

## 2018-09-14 DIAGNOSIS — Z85828 Personal history of other malignant neoplasm of skin: Secondary | ICD-10-CM | POA: Diagnosis not present

## 2018-09-14 DIAGNOSIS — L821 Other seborrheic keratosis: Secondary | ICD-10-CM | POA: Diagnosis not present

## 2018-09-14 DIAGNOSIS — C44519 Basal cell carcinoma of skin of other part of trunk: Secondary | ICD-10-CM | POA: Diagnosis not present

## 2018-09-14 DIAGNOSIS — L905 Scar conditions and fibrosis of skin: Secondary | ICD-10-CM | POA: Diagnosis not present

## 2018-09-14 DIAGNOSIS — D2261 Melanocytic nevi of right upper limb, including shoulder: Secondary | ICD-10-CM | POA: Diagnosis not present

## 2018-09-14 DIAGNOSIS — D0462 Carcinoma in situ of skin of left upper limb, including shoulder: Secondary | ICD-10-CM | POA: Diagnosis not present

## 2018-09-14 DIAGNOSIS — D1801 Hemangioma of skin and subcutaneous tissue: Secondary | ICD-10-CM | POA: Diagnosis not present

## 2018-09-14 DIAGNOSIS — D225 Melanocytic nevi of trunk: Secondary | ICD-10-CM | POA: Diagnosis not present

## 2018-09-22 ENCOUNTER — Ambulatory Visit (INDEPENDENT_AMBULATORY_CARE_PROVIDER_SITE_OTHER): Payer: Medicare HMO | Admitting: *Deleted

## 2018-09-22 DIAGNOSIS — E038 Other specified hypothyroidism: Secondary | ICD-10-CM | POA: Diagnosis not present

## 2018-09-22 DIAGNOSIS — I10 Essential (primary) hypertension: Secondary | ICD-10-CM | POA: Diagnosis not present

## 2018-09-22 DIAGNOSIS — M109 Gout, unspecified: Secondary | ICD-10-CM | POA: Diagnosis not present

## 2018-09-22 DIAGNOSIS — Z125 Encounter for screening for malignant neoplasm of prostate: Secondary | ICD-10-CM | POA: Diagnosis not present

## 2018-09-22 DIAGNOSIS — E7849 Other hyperlipidemia: Secondary | ICD-10-CM | POA: Diagnosis not present

## 2018-09-22 DIAGNOSIS — I442 Atrioventricular block, complete: Secondary | ICD-10-CM

## 2018-09-22 LAB — CUP PACEART REMOTE DEVICE CHECK
Battery Impedance: 231 Ohm
Battery Remaining Longevity: 102 mo
Battery Voltage: 2.78 V
Brady Statistic AP VP Percent: 64 %
Brady Statistic AP VS Percent: 0 %
Brady Statistic AS VP Percent: 35 %
Brady Statistic AS VS Percent: 0 %
Date Time Interrogation Session: 20200527162009
Implantable Lead Implant Date: 20070831
Implantable Lead Implant Date: 20070831
Implantable Lead Location: 753859
Implantable Lead Location: 753860
Implantable Lead Model: 4469
Implantable Lead Model: 4470
Implantable Lead Serial Number: 485949
Implantable Lead Serial Number: 553665
Implantable Pulse Generator Implant Date: 20160518
Lead Channel Impedance Value: 426 Ohm
Lead Channel Impedance Value: 457 Ohm
Lead Channel Pacing Threshold Amplitude: 0.625 V
Lead Channel Pacing Threshold Amplitude: 0.75 V
Lead Channel Pacing Threshold Pulse Width: 0.4 ms
Lead Channel Pacing Threshold Pulse Width: 0.4 ms
Lead Channel Setting Pacing Amplitude: 2 V
Lead Channel Setting Pacing Amplitude: 2.5 V
Lead Channel Setting Pacing Pulse Width: 0.4 ms
Lead Channel Setting Sensing Sensitivity: 2.8 mV

## 2018-09-28 DIAGNOSIS — R82998 Other abnormal findings in urine: Secondary | ICD-10-CM | POA: Diagnosis not present

## 2018-09-28 DIAGNOSIS — I10 Essential (primary) hypertension: Secondary | ICD-10-CM | POA: Diagnosis not present

## 2018-09-29 DIAGNOSIS — N184 Chronic kidney disease, stage 4 (severe): Secondary | ICD-10-CM | POA: Diagnosis not present

## 2018-09-29 DIAGNOSIS — I129 Hypertensive chronic kidney disease with stage 1 through stage 4 chronic kidney disease, or unspecified chronic kidney disease: Secondary | ICD-10-CM | POA: Diagnosis not present

## 2018-09-29 DIAGNOSIS — I739 Peripheral vascular disease, unspecified: Secondary | ICD-10-CM | POA: Diagnosis not present

## 2018-09-29 DIAGNOSIS — E785 Hyperlipidemia, unspecified: Secondary | ICD-10-CM | POA: Diagnosis not present

## 2018-09-29 DIAGNOSIS — Z1331 Encounter for screening for depression: Secondary | ICD-10-CM | POA: Diagnosis not present

## 2018-09-29 DIAGNOSIS — I6521 Occlusion and stenosis of right carotid artery: Secondary | ICD-10-CM | POA: Diagnosis not present

## 2018-09-29 DIAGNOSIS — I25118 Atherosclerotic heart disease of native coronary artery with other forms of angina pectoris: Secondary | ICD-10-CM | POA: Diagnosis not present

## 2018-09-29 DIAGNOSIS — I872 Venous insufficiency (chronic) (peripheral): Secondary | ICD-10-CM | POA: Diagnosis not present

## 2018-09-29 DIAGNOSIS — Z950 Presence of cardiac pacemaker: Secondary | ICD-10-CM | POA: Diagnosis not present

## 2018-09-29 DIAGNOSIS — Z0000 Encounter for general adult medical examination without abnormal findings: Secondary | ICD-10-CM | POA: Diagnosis not present

## 2018-10-01 ENCOUNTER — Encounter: Payer: Self-pay | Admitting: Cardiology

## 2018-10-01 NOTE — Progress Notes (Signed)
Remote pacemaker transmission.   

## 2018-10-13 DIAGNOSIS — N184 Chronic kidney disease, stage 4 (severe): Secondary | ICD-10-CM | POA: Diagnosis not present

## 2018-10-13 DIAGNOSIS — K219 Gastro-esophageal reflux disease without esophagitis: Secondary | ICD-10-CM | POA: Diagnosis not present

## 2018-10-13 DIAGNOSIS — I129 Hypertensive chronic kidney disease with stage 1 through stage 4 chronic kidney disease, or unspecified chronic kidney disease: Secondary | ICD-10-CM | POA: Diagnosis not present

## 2018-10-13 DIAGNOSIS — R05 Cough: Secondary | ICD-10-CM | POA: Diagnosis not present

## 2018-11-12 MED ORDER — NITROGLYCERIN 0.4 MG SL SUBL: 0.4000 mg | SUBLINGUAL_TABLET | SUBLINGUAL | 6 refills | Status: DC | PRN

## 2018-11-12 NOTE — Telephone Encounter (Signed)
Pt's medication was sent to pt's pharmacy as requested. Confirmation received.  °

## 2018-11-15 MED ORDER — METOPROLOL SUCCINATE ER 50 MG PO TB24: 50.0000 mg | ORAL_TABLET | Freq: Two times a day (BID) | ORAL | 3 refills | Status: DC

## 2018-11-15 MED ORDER — AMLODIPINE BESYLATE 10 MG PO TABS: 10.0000 mg | ORAL_TABLET | Freq: Every day | ORAL | 3 refills | Status: DC

## 2018-11-15 NOTE — Telephone Encounter (Signed)
Pt's medications were sent to pt's pharmacy as requested. Confirmation received.  

## 2018-11-30 MED ORDER — NITROGLYCERIN 0.4 MG SL SUBL: 0.4000 mg | SUBLINGUAL_TABLET | SUBLINGUAL | 1 refills | Status: DC | PRN

## 2018-11-30 MED ORDER — NITROGLYCERIN 0.4 MG SL SUBL: 0.4000 mg | SUBLINGUAL_TABLET | SUBLINGUAL | 6 refills | Status: DC | PRN

## 2018-11-30 NOTE — Telephone Encounter (Signed)
Pt's medication was sent to pt's pharmacies as requested. A 10 day supply to local pharmacy until [facility] mail order pharmacy sends out pt's medication. Confirmation received.

## 2018-12-22 ENCOUNTER — Ambulatory Visit (INDEPENDENT_AMBULATORY_CARE_PROVIDER_SITE_OTHER): Payer: Medicare HMO | Admitting: *Deleted

## 2018-12-22 DIAGNOSIS — I442 Atrioventricular block, complete: Secondary | ICD-10-CM

## 2018-12-22 LAB — CUP PACEART REMOTE DEVICE CHECK
Battery Impedance: 255 Ohm
Battery Remaining Longevity: 99 mo
Battery Voltage: 2.78 V
Brady Statistic AP VP Percent: 69 %
Brady Statistic AP VS Percent: 0 %
Brady Statistic AS VP Percent: 30 %
Brady Statistic AS VS Percent: 0 %
Date Time Interrogation Session: 20200826141000
Implantable Lead Implant Date: 20070831
Implantable Lead Implant Date: 20070831
Implantable Lead Location: 753859
Implantable Lead Location: 753860
Implantable Lead Model: 4469
Implantable Lead Model: 4470
Implantable Lead Serial Number: 485949
Implantable Lead Serial Number: 553665
Implantable Pulse Generator Implant Date: 20160518
Lead Channel Impedance Value: 459 Ohm
Lead Channel Impedance Value: 475 Ohm
Lead Channel Pacing Threshold Amplitude: 0.625 V
Lead Channel Pacing Threshold Amplitude: 0.625 V
Lead Channel Pacing Threshold Pulse Width: 0.4 ms
Lead Channel Pacing Threshold Pulse Width: 0.4 ms
Lead Channel Setting Pacing Amplitude: 2 V
Lead Channel Setting Pacing Amplitude: 2.5 V
Lead Channel Setting Pacing Pulse Width: 0.4 ms
Lead Channel Setting Sensing Sensitivity: 2.8 mV

## 2018-12-31 ENCOUNTER — Encounter: Payer: Self-pay | Admitting: Cardiology

## 2018-12-31 NOTE — Progress Notes (Signed)
Remote pacemaker transmission.   

## 2019-01-26 DIAGNOSIS — E785 Hyperlipidemia, unspecified: Secondary | ICD-10-CM | POA: Diagnosis not present

## 2019-01-26 DIAGNOSIS — H31013 Macula scars of posterior pole (postinflammatory) (post-traumatic), bilateral: Secondary | ICD-10-CM | POA: Diagnosis not present

## 2019-01-26 DIAGNOSIS — H25811 Combined forms of age-related cataract, right eye: Secondary | ICD-10-CM | POA: Diagnosis not present

## 2019-01-26 DIAGNOSIS — N183 Chronic kidney disease, stage 3 (moderate): Secondary | ICD-10-CM | POA: Diagnosis not present

## 2019-01-26 DIAGNOSIS — H31002 Unspecified chorioretinal scars, left eye: Secondary | ICD-10-CM | POA: Diagnosis not present

## 2019-01-26 DIAGNOSIS — I2510 Atherosclerotic heart disease of native coronary artery without angina pectoris: Secondary | ICD-10-CM | POA: Diagnosis not present

## 2019-01-26 DIAGNOSIS — I129 Hypertensive chronic kidney disease with stage 1 through stage 4 chronic kidney disease, or unspecified chronic kidney disease: Secondary | ICD-10-CM | POA: Diagnosis not present

## 2019-01-26 DIAGNOSIS — R809 Proteinuria, unspecified: Secondary | ICD-10-CM | POA: Diagnosis not present

## 2019-01-26 DIAGNOSIS — M329 Systemic lupus erythematosus, unspecified: Secondary | ICD-10-CM | POA: Diagnosis not present

## 2019-01-26 DIAGNOSIS — E039 Hypothyroidism, unspecified: Secondary | ICD-10-CM | POA: Diagnosis not present

## 2019-01-26 DIAGNOSIS — H52201 Unspecified astigmatism, right eye: Secondary | ICD-10-CM | POA: Diagnosis not present

## 2019-02-02 ENCOUNTER — Telehealth: Payer: Self-pay | Admitting: Cardiovascular Disease

## 2019-02-02 NOTE — Telephone Encounter (Signed)
   Primary Cardiologist: [name], MD  Chart reviewed as part of pre-operative protocol coverage. Cataract extractions are recognized in guidelines as low risk surgeries that do not typically require specific preoperative testing or holding of blood thinner therapy. Therefore, given past medical history and time since last visit, based on ACC/AHA guidelines, Kyle Avila would be at acceptable risk for the planned procedure without further cardiovascular testing.   I will route this recommendation to the requesting party via Epic fax function and remove from pre-op pool.  Please call with questions.  Kathyrn Drown, NP 02/02/2019, 2:53 PM

## 2019-02-02 NOTE — Telephone Encounter (Signed)
° °  [facility] Pre-operative Risk Assessment    Request for surgical clearance:  1. What type of surgery is being performed?  Cataract Surgery    2. When is this surgery scheduled?  03-01-19   3. What type of clearance is required (medical clearance vs. Pharmacy clearance to hold med vs. Both)? Medicine  4. Are there any medications that need to be held prior to surgery and how long? Plavix and Aspirin   5. Practice name and name of physician performing surgery? Dr Shon Hough   6. What is your office phone number [phone]    7.   What is your office fax number [phone]  8.   Anesthesia type (None, local, MAC, general) ? Sedation   Kyle Avila 02/02/2019, 2:20 PM  _________________________________________________________________   (provider comments below)

## 2019-02-02 NOTE — Telephone Encounter (Signed)
Message sent to patient that per office protocol, Dr. Kathrin Penner will need to send request and it will be filled out and returned.

## 2019-02-04 DIAGNOSIS — Z23 Encounter for immunization: Secondary | ICD-10-CM | POA: Diagnosis not present

## 2019-02-04 NOTE — Telephone Encounter (Signed)
° °  Dr Kathrin Penner calling, [phone] Requesting call from PA to discuss Plavix and Aspirin, wants to hold for 1 week prior. Please call

## 2019-02-07 NOTE — Telephone Encounter (Signed)
I spoke with Dr Kathrin Penner. Mr Kyle Avila is having cataract surgery but is at increased risk of bleeding based on technical aspects of the procedure. We agreed the best balance of bleeding and ischemic risk would be met with him continuing ASA 81 mg without interruption and holding plavix for 5 days before the procedure. He can start back on plavix the evening of or day after the procedure without a reloading dose. thanks

## 2019-02-07 NOTE — Telephone Encounter (Signed)
   Primary Cardiologist: [name], MD  Chart reviewed as part of pre-operative protocol coverage. Given past medical history and time since last visit, based on ACC/AHA guidelines, [place] would be at acceptable risk for the planned procedure without further cardiovascular testing.   Per Dr. Burt Knack: Case discussed with Dr Kathrin Penner. Kyle Avila is having cataract surgery but is at increased risk of bleeding based on technical aspects of the procedure. We agreed the best balance of bleeding and ischemic risk would be met with him continuing ASA 81 mg without interruption and holding plavix for 5 days before the procedure. He can start back on plavix the evening of or day after the procedure without a reloading dose.   I will route this recommendation to the requesting party via Epic fax function and remove from pre-op pool.  Please call with questions.  Kathyrn Drown, NP 02/07/2019, 2:55 PM

## 2019-02-07 NOTE — Telephone Encounter (Signed)
Dr. Burt Knack, please review, ophthalmology request to hold aspirin and plavix for 1 week prior to the procedure. Is it ok with you?

## 2019-02-23 ENCOUNTER — Other Ambulatory Visit: Payer: Self-pay

## 2019-02-23 ENCOUNTER — Ambulatory Visit: Payer: Medicare HMO | Admitting: Cardiovascular Disease

## 2019-02-23 ENCOUNTER — Encounter: Payer: Self-pay | Admitting: Cardiovascular Disease

## 2019-02-23 VITALS — BP 124/62 | HR 63 | Ht 68.0 in | Wt 171.6 lb

## 2019-02-23 DIAGNOSIS — N184 Chronic kidney disease, stage 4 (severe): Secondary | ICD-10-CM | POA: Diagnosis not present

## 2019-02-23 DIAGNOSIS — I10 Essential (primary) hypertension: Secondary | ICD-10-CM

## 2019-02-23 DIAGNOSIS — I25119 Atherosclerotic heart disease of native coronary artery with unspecified angina pectoris: Secondary | ICD-10-CM | POA: Diagnosis not present

## 2019-02-23 DIAGNOSIS — E782 Mixed hyperlipidemia: Secondary | ICD-10-CM

## 2019-02-23 MED ORDER — ISOSORBIDE MONONITRATE ER 120 MG PO TB24: 120.0000 mg | ORAL_TABLET | Freq: Every day | ORAL | 3 refills | Status: DC

## 2019-02-23 NOTE — Patient Instructions (Signed)
Medication Instructions:  1) INCREASE ISOSORBIDE to 120 mg daily *If you need a refill on your cardiac medications before your next appointment, please call your pharmacy*   Follow-Up: At [facility], you and your health needs are our priority.  As part of our continuing mission to provide you with exceptional heart care, we have created designated Provider Care Teams.  These Care Teams include your primary Cardiologist (physician) and Advanced Practice Providers (APPs -  Physician Assistants and Nurse Practitioners) who all work together to provide you with the care you need, when you need it. Your next appointment:   6 months The format for your next appointment:   In Person Provider:   You may see [name], MD or one of the following Advanced Practice Providers on your designated Care Team:    Richardson Dopp, PA-C  Vin [place], [place]  Daune Perch, [place]

## 2019-02-23 NOTE — Progress Notes (Signed)
Cardiology Office Note:    Date:  02/23/2019   ID:  Kyle Avila, DOB [birth year], MRN [id number]  PCP:  Kyle Solian, MD  Cardiologist:  Kyle Mocha, MD  Electrophysiologist:  Kyle Axe, MD   Referring MD: Kyle Solian, MD   Chief Complaint  Patient presents with  . Coronary Artery Disease  . Chest Pain    History of Present Illness:    [name] is a 73 y.o. male with a hx of CAD and chronic angina, presenting for follow-up evaluation.  The patient has an extensive coronary history with initial bypass surgery in 1988 followed by redo CABG in 1996.  He has undergone multiple PCI procedures over the years following his CABG.  His last PCI procedure in 2018 involved stenting of the saphenous vein graft to PDA.  He also has carotid disease status post right carotid endarterectomy, complete heart block status post pacemaker, chronic kidney disease, hypertension and hyperlipidemia.  The patient is here alone today.  He is doing well except he continues to require a sublingual nitroglycerin prior to exercise.  States that if he does not take nitroglycerin he will experience chest discomfort that makes him slow down or rest.  He denies shortness of breath, orthopnea, PND, or heart palpitations.  Past Medical History:  Diagnosis Date  . Arthritis    "some in my back" (04/14/2017)  . CAD (coronary artery disease)    s/p CABG twice, PCI twice  . Carotid artery occlusion    s/p CEA  . Chronic renal insufficiency   . CKD (chronic kidney disease), stage III   . Exertional angina ([place]) 11/03/2014  . Gout   . Heart block    s/p PTVDP  . HTN (hypertension)   . Hx of cardiovascular stress test    Adenosine Myoview (11/15):  Inferolateral and anterolateral infarct without significant ischemia, EF 42% - Intermediate Risk  . Hyperlipidemia   . 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 Mitral insufficiency   . Myocardial  infarction ([place])    "I've had some mild one" (04/14/2017)  . Presence of permanent cardiac pacemaker   . Skin cancer    "nose"  . 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 ACHILLES TENDON REPAIR Right   . CARDIAC CATHETERIZATION    . CARDIAC CATHETERIZATION N/A 11/03/2014   Procedure: Left Heart Cath and Cors/Grafts Angiography;  Surgeon: Kyle Mocha, MD;  Location: [place] [facility];  Service: Cardiovascular;  Laterality: N/A;  . CARDIAC CATHETERIZATION N/A 11/03/2014   Procedure: Coronary Stent Intervention;  Surgeon: Kyle Mocha, MD;  Location: [place] [facility];  Service: Cardiovascular;  Laterality: N/A;  . CAROTID ENDARTERECTOMY  2002   "? side"  . CATARACT EXTRACTION W/ INTRAOCULAR LENS IMPLANT     "? side"  . CORONARY ANGIOPLASTY WITH STENT PLACEMENT  04/14/2017  . CORONARY ANGIOPLASTY WITH STENT PLACEMENT  11/03/2014   SVG PVA  . CORONARY ARTERY BYPASS GRAFT     with redo cabg  . EP IMPLANTABLE DEVICE N/A 09/13/2014   Procedure: PPM Generator Changeout;  Surgeon: Deboraha Sprang, MD;  Location: [place] [facility];  Service: Cardiovascular;  Laterality: N/A;  . LEFT HEART CATH AND CORS/GRAFTS ANGIOGRAPHY N/A 04/14/2017   Procedure: LEFT HEART CATH AND CORS/GRAFTS ANGIOGRAPHY;  Surgeon: Kyle Mocha, MD;  Location: [place] [facility];  Service: Cardiovascular;  Laterality: N/A;  .  MOHS SURGERY     "just outside my nose"  . PACEMAKER INSERTION  2007  . TONSILLECTOMY  1958    Current Medications: Current Meds  Medication Sig  . allopurinol (ZYLOPRIM) 100 MG tablet Take 100 mg by mouth daily.   Marland Kitchen amLODipine (NORVASC) 10 MG tablet Take 1 tablet (10 mg total) by mouth daily.  Marland Kitchen aspirin EC 81 MG EC tablet Take 81 mg by mouth daily.   . clopidogrel (PLAVIX) 75 MG tablet Take 1 tablet (75 mg total) by mouth daily.  . Evolocumab (REPATHA [place]) Inject 140 mg into the skin twice a month  . furosemide (LASIX)  40 MG tablet Take 1 tablet (40 mg total) by mouth daily.  . isosorbide mononitrate (IMDUR) 120 MG 24 hr tablet Take 1 tablet (120 mg total) by mouth daily.  Marland Kitchen losartan (COZAAR) 100 MG tablet Take 1 tablet (100 mg total) by mouth 2 (two) times daily.  . metoprolol succinate (TOPROL-XL) 50 MG 24 hr tablet Take 1 tablet (50 mg total) by mouth 2 (two) times daily. Take with or immediately following meals.  . nitroGLYCERIN (NITROSTAT) 0.4 MG SL tablet Place 1 tablet (0.4 mg total) under the tongue every 5 (five) minutes as needed for chest pain.  . ranolazine (RANEXA) 1000 MG SR tablet Take 1,000 mg by mouth 2 (two) times daily.  . rosuvastatin (CRESTOR) 20 MG tablet Take 20 mg by mouth daily.  . tamsulosin (FLOMAX) 0.4 MG CAPS Take 0.4 mg by mouth 2 (two) times a day. Take 0.4 mg by mouth once daily  . [DISCONTINUED] isosorbide mononitrate (IMDUR) 60 MG 24 hr tablet Take 1 tablet by mouth daily.     Allergies:   Patient has no known allergies.   Social History   Socioeconomic History  . Marital status: Married    Spouse name: Not on file  . Number of children: Not on file  . Years of education: Not on file  . Highest education level: Not on file  Occupational History  . Not on file  Social Needs  . Financial resource strain: Not on file  . Food insecurity    Worry: Not on file    Inability: Not on file  . Transportation needs    Medical: Not on file    Non-medical: Not on file  Tobacco Use  . Smoking status: Never Smoker  . Smokeless tobacco: Never Used  Substance and Sexual Activity  . Alcohol use: Yes    Alcohol/week: 3.0 standard drinks    Types: 1 Glasses of wine, 1 Cans of beer, 1 Standard drinks or equivalent per week    Comment: \  . Drug use: No  . Sexual activity: Not Currently  Lifestyle  . Physical activity    Days per week: 4 days    Minutes per session: 30 min  . Stress: Not at all  Relationships  . Social Herbalist on phone: Not on file    Gets  together: Not on file    Attends religious service: Not on file    Active member of club or organization: Not on file    Attends meetings of clubs or organizations: Not on file    Relationship status: Not on file  Other Topics Concern  . Not on file  Social History Narrative  . Not on file     Family History: [name] family history includes Cancer in his mother; Heart attack in his father; Heart disease in his  father.  ROS:   Please see the history of present illness.    All other systems reviewed and are negative.  EKGs/Labs/Other Studies Reviewed:    The following studies were reviewed today: Cardiac Cath 04/15/2027: Cardiac catheterization 04/14/2017 LM 100 LAD ostial 100, mid 75 at LIMA anastomosis, mid to distal 40 beyond graft insertion RI ostial 100 LCx stent patent with 20 ISR; OM1 100 RCA ostial 100-CTO SVG-RPDA proximal 60, distal stent 90 ISR SVG-OM1 100 LIMA-LAD patent PCI: 4 x 16 mm Promus Premier DES to the proximal SVG-RPDA PCI: 4 x 24 mm Promus Premier DES to the distal SVG-RPDA  EKG:  EKG is not ordered today.    Recent Labs: No results found for requested labs within last 8760 hours.  Recent Lipid Panel    Component Value Date/Time   CHOL 159 02/12/2016 0803   TRIG 130 02/12/2016 0803   HDL 46 02/12/2016 0803   CHOLHDL 3.5 02/12/2016 0803   VLDL 26 02/12/2016 0803   LDLCALC 87 02/12/2016 0803    Physical Exam:    VS:  BP 124/62   Pulse 63   Ht 5\' 8"  (1.727 m)   Wt 171 lb 9.6 oz (77.8 kg)   SpO2 97%   BMI 26.09 kg/m     Wt Readings from Last 3 Encounters:  02/23/19 171 lb 9.6 oz (77.8 kg)  06/23/18 171 lb 6.4 oz (77.7 kg)  03/17/18 172 lb 1.9 oz (78.1 kg)     GEN:  Well nourished, well developed in no acute distress HEENT: Normal NECK: No JVD; No carotid bruits LYMPHATICS: No lymphadenopathy CARDIAC: RRR, soft systolic ejection murmur at the right upper sternal border RESPIRATORY:  Clear to auscultation without rales,  wheezing or rhonchi  ABDOMEN: Soft, non-tender, non-distended MUSCULOSKELETAL:  No edema; No deformity  SKIN: Warm and dry NEUROLOGIC:  Alert and oriented x 3 PSYCHIATRIC:  Normal affect   ASSESSMENT:    1. Coronary artery disease involving native coronary artery of native heart with angina pectoris ([place])   2. CKD (chronic kidney disease) stage 4, GFR 15-29 ml/min (HCC)   3. Mixed hyperlipidemia   4. Essential hypertension    PLAN:    In order of problems listed above:  1. The patient continues to have some degree of limiting, exertional angina.  I have recommended that he increase isosorbide to 120 mg daily and try to exercise without the use of sublingual nitroglycerin.  He will continue on amlodipine and metoprolol succinate at current doses.  He is also treated with ranolazine. 2. Recent labs reviewed with creatinine 2.1 mg/dL which is stable. 3. Lipids are excellent on a combination of rosuvastatin and evolocumab.  Cholesterol 104, HDL 36, LDL 18.  His triglycerides are elevated and we discussed lifestyle modification. 4. Blood pressure is well controlled on current medical therapy.   Medication Adjustments/Labs and Tests Ordered: Current medicines are reviewed at length with the patient today.  Concerns regarding medicines are outlined above.  No orders of the defined types were placed in this encounter.  Meds ordered this encounter  Medications  . isosorbide mononitrate (IMDUR) 120 MG 24 hr tablet    Sig: Take 1 tablet (120 mg total) by mouth daily.    Dispense:  90 tablet    Refill:  3    Patient Instructions  Medication Instructions:  1) INCREASE ISOSORBIDE to 120 mg daily *If you need a refill on your cardiac medications before your next appointment, please call your pharmacy*  Follow-Up: At [facility], you and your health needs are our priority.  As part of our continuing mission to provide you with exceptional heart care, we have created designated Provider  Care Teams.  These Care Teams include your primary Cardiologist (physician) and Advanced Practice Providers (APPs -  Physician Assistants and Nurse Practitioners) who all work together to provide you with the care you need, when you need it. Your next appointment:   6 months The format for your next appointment:   In Person Provider:   You may see Kyle Mocha, MD or one of the following Advanced Practice Providers on your designated Care Team:    Richardson Dopp, PA-C  Vin [place], PA-C  Daune Perch, [place]    Signed, Kyle Mocha, MD  02/23/2019 1:00 PM    [place]

## 2019-03-01 DIAGNOSIS — H268 Other specified cataract: Secondary | ICD-10-CM | POA: Diagnosis not present

## 2019-03-01 DIAGNOSIS — H52201 Unspecified astigmatism, right eye: Secondary | ICD-10-CM | POA: Diagnosis not present

## 2019-03-01 DIAGNOSIS — H25811 Combined forms of age-related cataract, right eye: Secondary | ICD-10-CM | POA: Diagnosis not present

## 2019-03-01 DIAGNOSIS — H21561 Pupillary abnormality, right eye: Secondary | ICD-10-CM | POA: Diagnosis not present

## 2019-03-18 DIAGNOSIS — Z85828 Personal history of other malignant neoplasm of skin: Secondary | ICD-10-CM | POA: Diagnosis not present

## 2019-03-18 DIAGNOSIS — D2262 Melanocytic nevi of left upper limb, including shoulder: Secondary | ICD-10-CM | POA: Diagnosis not present

## 2019-03-18 DIAGNOSIS — L218 Other seborrheic dermatitis: Secondary | ICD-10-CM | POA: Diagnosis not present

## 2019-03-18 DIAGNOSIS — D225 Melanocytic nevi of trunk: Secondary | ICD-10-CM | POA: Diagnosis not present

## 2019-03-18 DIAGNOSIS — L570 Actinic keratosis: Secondary | ICD-10-CM | POA: Diagnosis not present

## 2019-03-18 DIAGNOSIS — D1801 Hemangioma of skin and subcutaneous tissue: Secondary | ICD-10-CM | POA: Diagnosis not present

## 2019-03-18 DIAGNOSIS — L821 Other seborrheic keratosis: Secondary | ICD-10-CM | POA: Diagnosis not present

## 2019-03-18 DIAGNOSIS — L814 Other melanin hyperpigmentation: Secondary | ICD-10-CM | POA: Diagnosis not present

## 2019-03-23 ENCOUNTER — Ambulatory Visit (INDEPENDENT_AMBULATORY_CARE_PROVIDER_SITE_OTHER): Payer: Medicare HMO | Admitting: *Deleted

## 2019-03-23 DIAGNOSIS — I4430 Unspecified atrioventricular block: Secondary | ICD-10-CM | POA: Diagnosis not present

## 2019-03-23 LAB — CUP PACEART REMOTE DEVICE CHECK
Battery Impedance: 255 Ohm
Battery Remaining Longevity: 98 mo
Battery Voltage: 2.78 V
Brady Statistic AP VP Percent: 70 %
Brady Statistic AP VS Percent: 0 %
Brady Statistic AS VP Percent: 30 %
Brady Statistic AS VS Percent: 0 %
Date Time Interrogation Session: 20201125142451
Implantable Lead Implant Date: 20070831
Implantable Lead Implant Date: 20070831
Implantable Lead Location: 753859
Implantable Lead Location: 753860
Implantable Lead Model: 4469
Implantable Lead Model: 4470
Implantable Lead Serial Number: 485949
Implantable Lead Serial Number: 553665
Implantable Pulse Generator Implant Date: 20160518
Lead Channel Impedance Value: 439 Ohm
Lead Channel Impedance Value: 463 Ohm
Lead Channel Pacing Threshold Amplitude: 0.625 V
Lead Channel Pacing Threshold Amplitude: 0.75 V
Lead Channel Pacing Threshold Pulse Width: 0.4 ms
Lead Channel Pacing Threshold Pulse Width: 0.4 ms
Lead Channel Setting Pacing Amplitude: 2 V
Lead Channel Setting Pacing Amplitude: 2.5 V
Lead Channel Setting Pacing Pulse Width: 0.4 ms
Lead Channel Setting Sensing Sensitivity: 2.8 mV

## 2019-03-29 DIAGNOSIS — I25118 Atherosclerotic heart disease of native coronary artery with other forms of angina pectoris: Secondary | ICD-10-CM | POA: Diagnosis not present

## 2019-03-29 DIAGNOSIS — E039 Hypothyroidism, unspecified: Secondary | ICD-10-CM | POA: Diagnosis not present

## 2019-03-29 DIAGNOSIS — E038 Other specified hypothyroidism: Secondary | ICD-10-CM | POA: Diagnosis not present

## 2019-03-29 DIAGNOSIS — N184 Chronic kidney disease, stage 4 (severe): Secondary | ICD-10-CM | POA: Diagnosis not present

## 2019-03-29 DIAGNOSIS — I129 Hypertensive chronic kidney disease with stage 1 through stage 4 chronic kidney disease, or unspecified chronic kidney disease: Secondary | ICD-10-CM | POA: Diagnosis not present

## 2019-03-29 DIAGNOSIS — E785 Hyperlipidemia, unspecified: Secondary | ICD-10-CM | POA: Diagnosis not present

## 2019-03-29 DIAGNOSIS — K219 Gastro-esophageal reflux disease without esophagitis: Secondary | ICD-10-CM | POA: Diagnosis not present

## 2019-03-29 DIAGNOSIS — M109 Gout, unspecified: Secondary | ICD-10-CM | POA: Diagnosis not present

## 2019-03-30 MED ORDER — ROSUVASTATIN CALCIUM 20 MG PO TABS: 20.0000 mg | ORAL_TABLET | Freq: Every day | ORAL | 3 refills | Status: DC

## 2019-03-30 NOTE — Telephone Encounter (Signed)
Pt's medication was sent to pt's pharmacy as requested. Confirmation received.  °

## 2019-04-11 DIAGNOSIS — Z961 Presence of intraocular lens: Secondary | ICD-10-CM | POA: Diagnosis not present

## 2019-04-11 DIAGNOSIS — H31011 Macula scars of posterior pole (postinflammatory) (post-traumatic), right eye: Secondary | ICD-10-CM | POA: Diagnosis not present

## 2019-04-11 DIAGNOSIS — H3581 Retinal edema: Secondary | ICD-10-CM | POA: Diagnosis not present

## 2019-04-19 NOTE — Progress Notes (Signed)
PPM remote 

## 2019-05-13 DIAGNOSIS — L820 Inflamed seborrheic keratosis: Secondary | ICD-10-CM | POA: Diagnosis not present

## 2019-05-13 DIAGNOSIS — Z85828 Personal history of other malignant neoplasm of skin: Secondary | ICD-10-CM | POA: Diagnosis not present

## 2019-05-17 ENCOUNTER — Other Ambulatory Visit: Payer: Self-pay | Admitting: Cardiovascular Disease

## 2019-05-18 ENCOUNTER — Other Ambulatory Visit: Payer: Self-pay | Admitting: Cardiovascular Disease

## 2019-05-27 ENCOUNTER — Ambulatory Visit: Payer: Medicare HMO

## 2019-06-01 ENCOUNTER — Ambulatory Visit: Payer: Medicare HMO

## 2019-06-02 ENCOUNTER — Ambulatory Visit: Payer: Medicare HMO | Attending: Internal Medicine

## 2019-06-02 DIAGNOSIS — Z23 Encounter for immunization: Secondary | ICD-10-CM | POA: Insufficient documentation

## 2019-06-02 NOTE — Progress Notes (Signed)
   Covid-19 Vaccination Clinic  Name:  Kyle Avila    MRN: [id number] DOB: [birth year]  06/02/2019  Kyle Avila was observed post Covid-19 immunization for 15 minutes without incidence. He was provided with Vaccine Information Sheet and instruction to access the V-Safe system.   Kyle Avila was instructed to call 911 with any severe reactions post vaccine: Marland Kitchen Difficulty breathing  . Swelling of your face and throat  . A fast heartbeat  . A bad rash all over your body  . Dizziness and weakness    Immunizations Administered    Name Date Dose VIS Date Route   Pfizer COVID-19 Vaccine 06/02/2019 11:43 AM 0.3 mL 04/08/2019 Intramuscular   Manufacturer: [place]   Lot: [id number]   [place]: [address]-6

## 2019-06-19 DIAGNOSIS — I255 Ischemic cardiomyopathy: Secondary | ICD-10-CM

## 2019-06-19 DIAGNOSIS — I340 Nonrheumatic mitral (valve) insufficiency: Secondary | ICD-10-CM

## 2019-06-20 NOTE — Telephone Encounter (Signed)
Per Dr. Burt Knack, echo ordered for scheduling. Echo scheduled 4/23 and OV 4/26.

## 2019-06-22 ENCOUNTER — Ambulatory Visit (INDEPENDENT_AMBULATORY_CARE_PROVIDER_SITE_OTHER): Payer: Medicare HMO | Admitting: *Deleted

## 2019-06-22 DIAGNOSIS — I4430 Unspecified atrioventricular block: Secondary | ICD-10-CM | POA: Diagnosis not present

## 2019-06-22 LAB — CUP PACEART REMOTE DEVICE CHECK
Battery Impedance: 279 Ohm
Battery Remaining Longevity: 96 mo
Battery Voltage: 2.78 V
Brady Statistic AP VP Percent: 71 %
Brady Statistic AP VS Percent: 0 %
Brady Statistic AS VP Percent: 29 %
Brady Statistic AS VS Percent: 0 %
Date Time Interrogation Session: 20210224084548
Implantable Lead Implant Date: 20070831
Implantable Lead Implant Date: 20070831
Implantable Lead Location: 753859
Implantable Lead Location: 753860
Implantable Lead Model: 4469
Implantable Lead Model: 4470
Implantable Lead Serial Number: 485949
Implantable Lead Serial Number: 553665
Implantable Pulse Generator Implant Date: 20160518
Lead Channel Impedance Value: 456 Ohm
Lead Channel Impedance Value: 476 Ohm
Lead Channel Pacing Threshold Amplitude: 0.5 V
Lead Channel Pacing Threshold Amplitude: 0.5 V
Lead Channel Pacing Threshold Pulse Width: 0.4 ms
Lead Channel Pacing Threshold Pulse Width: 0.4 ms
Lead Channel Setting Pacing Amplitude: 2 V
Lead Channel Setting Pacing Amplitude: 2.5 V
Lead Channel Setting Pacing Pulse Width: 0.4 ms
Lead Channel Setting Sensing Sensitivity: 2.8 mV

## 2019-06-23 NOTE — Progress Notes (Signed)
PPM Remote  

## 2019-06-27 ENCOUNTER — Ambulatory Visit: Payer: Medicare HMO | Attending: Internal Medicine

## 2019-06-27 DIAGNOSIS — Z23 Encounter for immunization: Secondary | ICD-10-CM | POA: Insufficient documentation

## 2019-06-27 NOTE — Progress Notes (Signed)
   Covid-19 Vaccination Clinic  Name:  Kyle Avila    MRN: [id number] DOB: 1945-12-10  06/27/2019  Kyle Avila was observed post Covid-19 immunization for 15 minutes without incidence. He was provided with Vaccine Information Sheet and instruction to access the V-Safe system.   Kyle Avila was instructed to call 911 with any severe reactions post vaccine: Marland Kitchen Difficulty breathing  . Swelling of your face and throat  . A fast heartbeat  . A bad rash all over your body  . Dizziness and weakness    Immunizations Administered    Name Date Dose VIS Date Route   Pfizer COVID-19 Vaccine 06/27/2019  9:13 AM 0.3 mL 04/08/2019 Intramuscular   Manufacturer: [place]   Lot: [id number]   [place]: [id number]

## 2019-07-19 ENCOUNTER — Other Ambulatory Visit: Payer: Self-pay | Admitting: Physician Assistant

## 2019-07-19 MED ORDER — ISOSORBIDE MONONITRATE ER 120 MG PO TB24: 120.0000 mg | ORAL_TABLET | Freq: Every day | ORAL | 1 refills | Status: DC

## 2019-07-20 MED ORDER — RANOLAZINE ER 1000 MG PO TB12: 1000.0000 mg | ORAL_TABLET | Freq: Two times a day (BID) | ORAL | 3 refills | Status: DC

## 2019-08-01 DIAGNOSIS — R809 Proteinuria, unspecified: Secondary | ICD-10-CM | POA: Diagnosis not present

## 2019-08-01 DIAGNOSIS — N183 Chronic kidney disease, stage 3 unspecified: Secondary | ICD-10-CM | POA: Diagnosis not present

## 2019-08-01 DIAGNOSIS — I2510 Atherosclerotic heart disease of native coronary artery without angina pectoris: Secondary | ICD-10-CM | POA: Diagnosis not present

## 2019-08-01 DIAGNOSIS — I129 Hypertensive chronic kidney disease with stage 1 through stage 4 chronic kidney disease, or unspecified chronic kidney disease: Secondary | ICD-10-CM | POA: Diagnosis not present

## 2019-08-01 DIAGNOSIS — M109 Gout, unspecified: Secondary | ICD-10-CM | POA: Diagnosis not present

## 2019-08-19 ENCOUNTER — Ambulatory Visit (HOSPITAL_COMMUNITY): Payer: Medicare HMO | Attending: Cardiovascular Disease

## 2019-08-19 ENCOUNTER — Other Ambulatory Visit: Payer: Self-pay

## 2019-08-19 DIAGNOSIS — I255 Ischemic cardiomyopathy: Secondary | ICD-10-CM

## 2019-08-19 DIAGNOSIS — I340 Nonrheumatic mitral (valve) insufficiency: Secondary | ICD-10-CM | POA: Diagnosis not present

## 2019-08-22 ENCOUNTER — Ambulatory Visit: Payer: Medicare HMO | Admitting: Cardiovascular Disease

## 2019-08-22 ENCOUNTER — Encounter: Payer: Self-pay | Admitting: Cardiovascular Disease

## 2019-08-22 ENCOUNTER — Other Ambulatory Visit: Payer: Self-pay

## 2019-08-22 VITALS — BP 132/70 | HR 60 | Ht 68.0 in | Wt 173.0 lb

## 2019-08-22 DIAGNOSIS — I10 Essential (primary) hypertension: Secondary | ICD-10-CM | POA: Diagnosis not present

## 2019-08-22 DIAGNOSIS — E782 Mixed hyperlipidemia: Secondary | ICD-10-CM | POA: Diagnosis not present

## 2019-08-22 DIAGNOSIS — I25119 Atherosclerotic heart disease of native coronary artery with unspecified angina pectoris: Secondary | ICD-10-CM

## 2019-08-22 DIAGNOSIS — I442 Atrioventricular block, complete: Secondary | ICD-10-CM

## 2019-08-22 NOTE — Progress Notes (Signed)
Cardiology Office Note:    Date:  08/22/2019   ID:  Kyle Avila, DOB 03-Mar-1946, MRN [id number]  PCP:  [name], MD  Cardiologist:  [name], MD  Electrophysiologist:  [name], MD   Referring MD: [name], MD   Chief Complaint  Patient presents with  . Chest Pain    History of Present Illness:    Kyle Avila is a 74 y.o. male with a hx of CAD and chronic angina, presenting for follow-up evaluation.The patient has an extensive coronary history with initial bypass surgery in 1988 followed by redo CABG in 1996. He has undergone multiple PCI procedures over the years following his CABG. His last PCI procedure in 2018 involved stenting of the saphenous vein graft to PDA. He also has carotid disease status post right carotid endarterectomy, complete heart block status post pacemaker, chronic kidney disease, hypertension and hyperlipidemia.  The patient is here alone today.  Overall he is doing well and reports stable symptoms from a cardiac perspective.  He had a recent echocardiogram last week and we reviewed these results today.  He continues to have angina with some activities.  He has chest discomfort when using his riding mower especially when it involves mowing a bumpy field.  He has some anginal chest discomfort with extended walking.  He takes nitroglycerin on an as needed basis and reports no change in the pattern of his chronic angina.  He feels like the increase in isosorbide at the time of his last visit helped his angina.  He denies shortness of breath, orthopnea, PND, or heart palpitations.   Past Medical History:  Diagnosis Date  . Arthritis    "some in my back" (04/14/2017)  . CAD (coronary artery disease)    s/p CABG twice, PCI twice  . Carotid artery occlusion    s/p CEA  . Chronic renal insufficiency   . CKD (chronic kidney disease), stage III   . Exertional angina ([place]) 11/03/2014  . Gout   . Heart block    s/p PTVDP  . HTN  (hypertension)   . Hx of cardiovascular stress test    Adenosine Myoview (11/15):  Inferolateral and anterolateral infarct without significant ischemia, EF 42% - Intermediate Risk  . Hyperlipidemia   . 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 Mitral insufficiency   . Myocardial infarction ([place])    "I've had some mild one" (04/14/2017)  . Presence of permanent cardiac pacemaker   . Skin cancer    "nose"  . 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 ACHILLES TENDON REPAIR Right   . CARDIAC CATHETERIZATION    . CARDIAC CATHETERIZATION N/A 11/03/2014   Procedure: Left Heart Cath and Cors/Grafts Angiography;  Surgeon: [name], MD;  Location: [facility];  Service: Cardiovascular;  Laterality: N/A;  . CARDIAC CATHETERIZATION N/A 11/03/2014   Procedure: Coronary Stent Intervention;  Surgeon: [name], MD;  Location: [facility];  Service: Cardiovascular;  Laterality: N/A;  . CAROTID ENDARTERECTOMY  2002   "? side"  . CATARACT EXTRACTION W/ INTRAOCULAR LENS IMPLANT     "? side"  . CORONARY ANGIOPLASTY WITH STENT PLACEMENT  04/14/2017  . CORONARY ANGIOPLASTY WITH STENT PLACEMENT  11/03/2014   SVG PVA  . CORONARY ARTERY BYPASS GRAFT     with redo cabg  . EP IMPLANTABLE DEVICE N/A 09/13/2014   Procedure: PPM  Nature conservation officer;  Surgeon: Deboraha Sprang, MD;  Location: [facility];  Service: Cardiovascular;  Laterality: N/A;  . LEFT HEART CATH AND CORS/GRAFTS ANGIOGRAPHY N/A 04/14/2017   Procedure: LEFT HEART CATH AND CORS/GRAFTS ANGIOGRAPHY;  Surgeon: [name], MD;  Location: [facility];  Service: Cardiovascular;  Laterality: N/A;  . MOHS SURGERY     "just outside my nose"  . PACEMAKER INSERTION  2007  . TONSILLECTOMY  1958    Current Medications: Current Meds  Medication Sig  . allopurinol (ZYLOPRIM) 100 MG tablet Take 100 mg by  mouth daily.   Marland Kitchen amLODipine (NORVASC) 10 MG tablet Take 1 tablet (10 mg total) by mouth daily.  Marland Kitchen aspirin EC 81 MG EC tablet Take 81 mg by mouth daily.   . clopidogrel (PLAVIX) 75 MG tablet Take 1 tablet (75 mg total) by mouth daily.  . Evolocumab (REPATHA [place]) Inject 140 mg into the skin twice a month  . furosemide (LASIX) 40 MG tablet Take 1 tablet (40 mg total) by mouth daily.  . isosorbide mononitrate (IMDUR) 120 MG 24 hr tablet Take 1 tablet (120 mg total) by mouth daily.  Marland Kitchen losartan (COZAAR) 100 MG tablet Take 1 tablet (100 mg total) by mouth 2 (two) times daily.  . metoprolol succinate (TOPROL-XL) 50 MG 24 hr tablet Take 1 tablet (50 mg total) by mouth 2 (two) times daily. Take with or immediately following meals.  Marland Kitchen NITROSTAT 0.4 MG SL tablet PLACE ONE TABLET UNDER THE TONGUE EVERY FIVE MINUTES AS NEEDED FOR CHEST PAIN  . ranolazine (RANEXA) 1000 MG SR tablet Take 1 tablet (1,000 mg total) by mouth 2 (two) times daily.  . rosuvastatin (CRESTOR) 20 MG tablet Take 1 tablet (20 mg total) by mouth daily.  . tamsulosin (FLOMAX) 0.4 MG CAPS Take 0.4 mg by mouth 2 (two) times a day. Take 0.4 mg by mouth once daily     Allergies:   Patient has no known allergies.   Social History   Socioeconomic History  . Marital status: Married    Spouse name: Not on file  . Number of children: Not on file  . Years of education: Not on file  . Highest education level: Not on file  Occupational History  . Not on file  Tobacco Use  . Smoking status: Never Smoker  . Smokeless tobacco: Never Used  Substance and Sexual Activity  . Alcohol use: Yes    Alcohol/week: 3.0 standard drinks    Types: 1 Glasses of wine, 1 Cans of beer, 1 Standard drinks or equivalent per week    Comment: \  . Drug use: No  . Sexual activity: Not Currently  Other Topics Concern  . Not on file  Social History Narrative  . Not on file   Social Determinants of Health   Financial Resource Strain:   . Difficulty of Paying  Living Expenses:   Food Insecurity:   . Worried About Charity fundraiser in the Last Year:   . Arboriculturist in the Last Year:   Transportation Needs:   . Film/video editor (Medical):   Marland Kitchen Lack of Transportation (Non-Medical):   Physical Activity:   . Days of Exercise per Week:   . Minutes of Exercise per Session:   Stress:   . Feeling of Stress :   Social Connections:   . Frequency of Communication with Friends and Family:   . Frequency of Social Gatherings with Friends and Family:   .  Attends Religious Services:   . Active Member of Clubs or Organizations:   . Attends Archivist Meetings:   Marland Kitchen Marital Status:      Family History: [name] family history includes Cancer in his mother; Heart attack in his father; Heart disease in his father.  ROS:   Please see the history of present illness.    All other systems reviewed and are negative.  EKGs/Labs/Other Studies Reviewed:    The following studies were reviewed today: 2D echocardiogram: IMPRESSIONS    1. Left ventricular ejection fraction, by estimation, is 45 to 50%. The  left ventricle has mildly decreased function. The left ventricle has no  regional wall motion abnormalities. Left ventricular diastolic parameters  are consistent with Grade II  diastolic dysfunction (pseudonormalization). Elevated left atrial  pressure. There is severe hypokinesis of the left ventricular, basal-mid  inferolateral wall.  2. Right ventricular systolic function is normal. The right ventricular  size is mildly enlarged. There is mildly elevated pulmonary artery  systolic pressure. The estimated right ventricular systolic pressure is  34.1 mmHg.  3. Left atrial size was moderately dilated.  4. Right atrial size was moderately dilated.  5. The mitral valve is normal in structure. Mild mitral valve  regurgitation. No evidence of mitral stenosis.  6. The aortic valve is normal in structure. Aortic valve  regurgitation is  trivial. No aortic stenosis is present.  7. The inferior vena cava is normal in size with greater than 50%  respiratory variability, suggesting right atrial pressure of 3 mmHg.   Comparison(s): Prior images reviewed side by side. The left ventricular  function is unchanged. The left ventricular wall motion abnormality is  unchanged. There are more prominent signs of increased mean left at   EKG:  EKG is ordered today.  The ekg ordered today demonstrates AV sequential pacing 60 bpm  Recent Labs: No results found for requested labs within last 8760 hours.  Recent Lipid Panel    Component Value Date/Time   CHOL 159 02/12/2016 0803   TRIG 130 02/12/2016 0803   HDL 46 02/12/2016 0803   CHOLHDL 3.5 02/12/2016 0803   VLDL 26 02/12/2016 0803   LDLCALC 87 02/12/2016 0803    Physical Exam:    VS:  BP 132/70   Pulse 60   Ht 5\' 8"  (1.727 m)   Wt 173 lb (78.5 kg)   SpO2 97%   BMI 26.30 kg/m     Wt Readings from Last 3 Encounters:  08/22/19 173 lb (78.5 kg)  02/23/19 171 lb 9.6 oz (77.8 kg)  06/23/18 171 lb 6.4 oz (77.7 kg)     GEN:  Well nourished, well developed in no acute distress HEENT: Normal NECK: No JVD; No carotid bruits LYMPHATICS: No lymphadenopathy CARDIAC: RRR, 2/6 systolic murmur at the right upper sternal border RESPIRATORY:  Clear to auscultation without rales, wheezing or rhonchi  ABDOMEN: Soft, non-tender, non-distended MUSCULOSKELETAL:  No edema; No deformity  SKIN: Warm and dry NEUROLOGIC:  Alert and oriented x 3 PSYCHIATRIC:  Normal affect   ASSESSMENT:    1. Mixed hyperlipidemia   2. Essential hypertension   3. Coronary artery disease involving native coronary artery of native heart with angina pectoris ([place])   4. Complete heart block ([facility])    PLAN:    In order of problems listed above:  1. Lipids are excellent on a combination of rosuvastatin and evolocumab.  He will continue his current therapy.  Last LDL cholesterol was 18  mg/dL. 2. Blood pressure is controlled on his current regimen which is reviewed today.  Most recent creatinine on file was 1.81 mg/dL, stable for this patient. 3. Stable angina reported.  He continues on amlodipine, ranolazine, metoprolol succinate, and isosorbide. 4. Status post permanent pacemaker, EKG shows AV sequential pacing.   Medication Adjustments/Labs and Tests Ordered: Current medicines are reviewed at length with the patient today.  Concerns regarding medicines are outlined above.  Orders Placed This Encounter  Procedures  . EKG 12-Lead   No orders of the defined types were placed in this encounter.   Patient Instructions  Medication Instructions:  Your provider recommends that you continue on your current medications as directed. Please refer to the Current Medication list given to you today.   *If you need a refill on your cardiac medications before your next appointment, please call your pharmacy*   Follow-Up: At [facility], you and your health needs are our priority.  As part of our continuing mission to provide you with exceptional heart care, we have created designated Provider Care Teams.  These Care Teams include your primary Cardiologist (physician) and Advanced Practice Providers (APPs -  Physician Assistants and Nurse Practitioners) who all work together to provide you with the care you need, when you need it. Your next appointment:   6 month(s) The format for your next appointment:   In Person Provider:   You may see [name], MD or one of the following Advanced Practice Providers on your designated Care Team:    Richardson Dopp, PA-C  Vin [place], PA-C  Daune Perch, [place]      Signed, [name], MD  08/22/2019 9:40 AM    [place]

## 2019-08-22 NOTE — Patient Instructions (Signed)
Medication Instructions:  Your provider recommends that you continue on your current medications as directed. Please refer to the Current Medication list given to you today.   *If you need a refill on your cardiac medications before your next appointment, please call your pharmacy*   Follow-Up: At [facility], you and your health needs are our priority.  As part of our continuing mission to provide you with exceptional heart care, we have created designated Provider Care Teams.  These Care Teams include your primary Cardiologist (physician) and Advanced Practice Providers (APPs -  Physician Assistants and Nurse Practitioners) who all work together to provide you with the care you need, when you need it. Your next appointment:   6 month(s) The format for your next appointment:   In Person Provider:   You may see Michael Cooper, MD or one of the following Advanced Practice Providers on your designated Care Team:    Scott Weaver, PA-C  Vin Bhagat, PA-C  Janine Hammond, NP   

## 2019-08-24 MED ORDER — LOSARTAN POTASSIUM 100 MG PO TABS: 100.0000 mg | ORAL_TABLET | Freq: Two times a day (BID) | ORAL | 3 refills | Status: DC

## 2019-08-24 MED ORDER — CLOPIDOGREL BISULFATE 75 MG PO TABS: 75.0000 mg | ORAL_TABLET | Freq: Every day | ORAL | 3 refills | Status: DC

## 2019-09-15 DIAGNOSIS — D1801 Hemangioma of skin and subcutaneous tissue: Secondary | ICD-10-CM | POA: Diagnosis not present

## 2019-09-15 DIAGNOSIS — L570 Actinic keratosis: Secondary | ICD-10-CM | POA: Diagnosis not present

## 2019-09-15 DIAGNOSIS — D2262 Melanocytic nevi of left upper limb, including shoulder: Secondary | ICD-10-CM | POA: Diagnosis not present

## 2019-09-15 DIAGNOSIS — C44519 Basal cell carcinoma of skin of other part of trunk: Secondary | ICD-10-CM | POA: Diagnosis not present

## 2019-09-15 DIAGNOSIS — D225 Melanocytic nevi of trunk: Secondary | ICD-10-CM | POA: Diagnosis not present

## 2019-09-15 DIAGNOSIS — L853 Xerosis cutis: Secondary | ICD-10-CM | POA: Diagnosis not present

## 2019-09-15 DIAGNOSIS — L821 Other seborrheic keratosis: Secondary | ICD-10-CM | POA: Diagnosis not present

## 2019-09-15 DIAGNOSIS — L814 Other melanin hyperpigmentation: Secondary | ICD-10-CM | POA: Diagnosis not present

## 2019-09-15 DIAGNOSIS — B351 Tinea unguium: Secondary | ICD-10-CM | POA: Diagnosis not present

## 2019-09-15 DIAGNOSIS — Z85828 Personal history of other malignant neoplasm of skin: Secondary | ICD-10-CM | POA: Diagnosis not present

## 2019-09-20 DIAGNOSIS — H43811 Vitreous degeneration, right eye: Secondary | ICD-10-CM | POA: Diagnosis not present

## 2019-09-21 ENCOUNTER — Ambulatory Visit (INDEPENDENT_AMBULATORY_CARE_PROVIDER_SITE_OTHER): Payer: Medicare HMO | Admitting: *Deleted

## 2019-09-21 DIAGNOSIS — I4430 Unspecified atrioventricular block: Secondary | ICD-10-CM

## 2019-09-22 LAB — CUP PACEART REMOTE DEVICE CHECK
Battery Impedance: 327 Ohm
Battery Remaining Longevity: 91 mo
Battery Voltage: 2.78 V
Brady Statistic AP VP Percent: 71 %
Brady Statistic AP VS Percent: 0 %
Brady Statistic AS VP Percent: 29 %
Brady Statistic AS VS Percent: 0 %
Date Time Interrogation Session: 20210526163323
Implantable Lead Implant Date: 20070831
Implantable Lead Implant Date: 20070831
Implantable Lead Location: 753859
Implantable Lead Location: 753860
Implantable Lead Model: 4469
Implantable Lead Model: 4470
Implantable Lead Serial Number: 485949
Implantable Lead Serial Number: 553665
Implantable Pulse Generator Implant Date: 20160518
Lead Channel Impedance Value: 456 Ohm
Lead Channel Impedance Value: 482 Ohm
Lead Channel Pacing Threshold Amplitude: 0.625 V
Lead Channel Pacing Threshold Amplitude: 0.75 V
Lead Channel Pacing Threshold Pulse Width: 0.4 ms
Lead Channel Pacing Threshold Pulse Width: 0.4 ms
Lead Channel Setting Pacing Amplitude: 2 V
Lead Channel Setting Pacing Amplitude: 2.5 V
Lead Channel Setting Pacing Pulse Width: 0.4 ms
Lead Channel Setting Sensing Sensitivity: 2.8 mV

## 2019-09-22 NOTE — Progress Notes (Signed)
Remote pacemaker transmission.   

## 2019-09-23 ENCOUNTER — Telehealth: Payer: Self-pay

## 2019-09-23 NOTE — Telephone Encounter (Signed)
Mychart message received from patient requesting phone call to discuss results of PM check.   Spoke with pt and reviewed the following results   Scheduled remote reviewed. Normal device function.  There were 10 atrial arrhythmias detected, 9 were less than 1 minutes and there was one that was 48.5 minutes.

## 2019-09-27 DIAGNOSIS — E038 Other specified hypothyroidism: Secondary | ICD-10-CM | POA: Diagnosis not present

## 2019-09-27 DIAGNOSIS — E7849 Other hyperlipidemia: Secondary | ICD-10-CM | POA: Diagnosis not present

## 2019-09-27 DIAGNOSIS — Z0000 Encounter for general adult medical examination without abnormal findings: Secondary | ICD-10-CM | POA: Diagnosis not present

## 2019-09-27 DIAGNOSIS — M109 Gout, unspecified: Secondary | ICD-10-CM | POA: Diagnosis not present

## 2019-09-27 DIAGNOSIS — Z125 Encounter for screening for malignant neoplasm of prostate: Secondary | ICD-10-CM | POA: Diagnosis not present

## 2019-09-28 DIAGNOSIS — I129 Hypertensive chronic kidney disease with stage 1 through stage 4 chronic kidney disease, or unspecified chronic kidney disease: Secondary | ICD-10-CM | POA: Diagnosis not present

## 2019-09-28 DIAGNOSIS — R82998 Other abnormal findings in urine: Secondary | ICD-10-CM | POA: Diagnosis not present

## 2019-10-05 DIAGNOSIS — I6521 Occlusion and stenosis of right carotid artery: Secondary | ICD-10-CM | POA: Diagnosis not present

## 2019-10-05 DIAGNOSIS — E785 Hyperlipidemia, unspecified: Secondary | ICD-10-CM | POA: Diagnosis not present

## 2019-10-05 DIAGNOSIS — Z950 Presence of cardiac pacemaker: Secondary | ICD-10-CM | POA: Diagnosis not present

## 2019-10-05 DIAGNOSIS — Z0000 Encounter for general adult medical examination without abnormal findings: Secondary | ICD-10-CM | POA: Diagnosis not present

## 2019-10-05 DIAGNOSIS — N184 Chronic kidney disease, stage 4 (severe): Secondary | ICD-10-CM | POA: Diagnosis not present

## 2019-10-05 DIAGNOSIS — I129 Hypertensive chronic kidney disease with stage 1 through stage 4 chronic kidney disease, or unspecified chronic kidney disease: Secondary | ICD-10-CM | POA: Diagnosis not present

## 2019-10-05 DIAGNOSIS — I872 Venous insufficiency (chronic) (peripheral): Secondary | ICD-10-CM | POA: Diagnosis not present

## 2019-10-05 DIAGNOSIS — I739 Peripheral vascular disease, unspecified: Secondary | ICD-10-CM | POA: Diagnosis not present

## 2019-10-05 DIAGNOSIS — Z1331 Encounter for screening for depression: Secondary | ICD-10-CM | POA: Diagnosis not present

## 2019-10-05 DIAGNOSIS — I25118 Atherosclerotic heart disease of native coronary artery with other forms of angina pectoris: Secondary | ICD-10-CM | POA: Diagnosis not present

## 2019-10-06 DIAGNOSIS — Z1212 Encounter for screening for malignant neoplasm of rectum: Secondary | ICD-10-CM | POA: Diagnosis not present

## 2019-10-06 LAB — IFOBT (OCCULT BLOOD): IFOBT: NEGATIVE

## 2019-10-11 NOTE — Telephone Encounter (Signed)
Spoke with Kyle Avila. He states he feels fine "as long as I don't do anything." He reports he is "trying to avoid the emergency room." Scheduled the patient with Dr. Burt Knack this Friday.  ER precautions reviewed and understood.

## 2019-10-14 ENCOUNTER — Other Ambulatory Visit: Payer: Self-pay

## 2019-10-14 ENCOUNTER — Encounter: Payer: Self-pay | Admitting: Cardiovascular Disease

## 2019-10-14 ENCOUNTER — Ambulatory Visit: Payer: Medicare HMO | Admitting: Cardiovascular Disease

## 2019-10-14 VITALS — BP 124/78 | HR 61 | Ht 68.0 in | Wt 171.6 lb

## 2019-10-14 DIAGNOSIS — N184 Chronic kidney disease, stage 4 (severe): Secondary | ICD-10-CM | POA: Diagnosis not present

## 2019-10-14 DIAGNOSIS — E782 Mixed hyperlipidemia: Secondary | ICD-10-CM | POA: Diagnosis not present

## 2019-10-14 DIAGNOSIS — I25119 Atherosclerotic heart disease of native coronary artery with unspecified angina pectoris: Secondary | ICD-10-CM | POA: Diagnosis not present

## 2019-10-14 DIAGNOSIS — I10 Essential (primary) hypertension: Secondary | ICD-10-CM

## 2019-10-14 LAB — CBC WITH DIFFERENTIAL/PLATELET
Basophils Absolute: 0 10*3/uL (ref 0.0–0.2)
Basos: 0 %
EOS (ABSOLUTE): 0.3 10*3/uL (ref 0.0–0.4)
Eos: 7 %
Hematocrit: 39 % (ref 37.5–51.0)
Hemoglobin: 13.2 g/dL (ref 13.0–17.7)
Lymphocytes Absolute: 1.1 10*3/uL (ref 0.7–3.1)
Lymphs: 27 %
MCH: 35.3 pg — ABNORMAL HIGH (ref 26.6–33.0)
MCHC: 33.8 g/dL (ref 31.5–35.7)
MCV: 104 fL — ABNORMAL HIGH (ref 79–97)
Monocytes Absolute: 0.6 10*3/uL (ref 0.1–0.9)
Monocytes: 15 %
Neutrophils Absolute: 2 10*3/uL (ref 1.4–7.0)
Neutrophils: 51 %
Platelets: 176 10*3/uL (ref 150–450)
RBC: 3.74 x10E6/uL — ABNORMAL LOW (ref 4.14–5.80)
RDW: 13.4 % (ref 11.6–15.4)
WBC: 3.9 10*3/uL (ref 3.4–10.8)

## 2019-10-14 LAB — BASIC METABOLIC PANEL
BUN/Creatinine Ratio: 20 (ref 10–24)
BUN: 44 mg/dL — ABNORMAL HIGH (ref 8–27)
CO2: 30 mmol/L — ABNORMAL HIGH (ref 20–29)
Calcium: 9.5 mg/dL (ref 8.6–10.2)
Chloride: 103 mmol/L (ref 96–106)
Creatinine, Ser: 2.16 mg/dL — ABNORMAL HIGH (ref 0.76–1.27)
GFR calc Af Amer: 34 mL/min/{1.73_m2} — ABNORMAL LOW (ref >59)
GFR calc non Af Amer: 29 mL/min/{1.73_m2} — ABNORMAL LOW (ref >59)
Glucose: 126 mg/dL — ABNORMAL HIGH (ref 65–99)
Potassium: 4.8 mmol/L (ref 3.5–5.2)
Sodium: 140 mmol/L (ref 134–144)

## 2019-10-14 NOTE — H&P (View-Only) (Signed)
Cardiology Office Note:    Date:  10/14/2019   ID:  Kyle Avila, DOB [birth year], MRN [id number]  PCP:  [name], MD  [facility] Cardiologist:  [name], MD  [facility] Electrophysiologist:  [name], MD   Referring MD: [name], MD   Chief Complaint  Patient presents with  . Chest Pain    History of Present Illness:    Kyle Avila is a 74 y.o. male with a hx of CAD and chronic angina, presenting for follow-up evaluation.The patient has an extensive coronary history with initial bypass surgery in 1988 followed by redo CABG in 1996. He has undergone multiple PCI procedures over the years following his CABG. His last PCI procedure in 2018 involved stenting of the saphenous vein graft to PDA. He also has carotid disease status post right carotid endarterectomy, complete heart block status post pacemaker, chronic kidney disease, hypertension and hyperlipidemia.The patient called in with increasing angina and is added on for office evaluation today.  He was last seen August 22, 2019 when his anginal symptoms were reported as stable.  The patient is here alone today.  He is having a lot of angina.  States that he is having substernal chest discomfort with walking short distances, doing light gardening, and with most other activities.  He is taking nitroglycerin frequently.  He was in [place] visiting his son this weekend and he had to take nitroglycerin every time they went for a walk.  He has mild exertional dyspnea associated with this.  No orthopnea, PND, or leg swelling.  No resting angina.  His pain does subside over several minutes after he rests.  Past Medical History:  Diagnosis Date  . Arthritis    "some in my back" (04/14/2017)  . CAD (coronary artery disease)    s/p CABG twice, PCI twice  . Carotid artery occlusion    s/p CEA  . Chronic renal insufficiency   . CKD (chronic kidney disease), stage III   . Exertional angina ([place])  11/03/2014  . Gout   . Heart block    s/p PTVDP  . HTN (hypertension)   . Hx of cardiovascular stress test    Adenosine Myoview (11/15):  Inferolateral and anterolateral infarct without significant ischemia, EF 42% - Intermediate Risk  . Hyperlipidemia   . 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 Mitral insufficiency   . Myocardial infarction ([place])    "I've had some mild one" (04/14/2017)  . Presence of permanent cardiac pacemaker   . Skin cancer    "nose"  . 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 ACHILLES TENDON REPAIR Right   . CARDIAC CATHETERIZATION    . CARDIAC CATHETERIZATION N/A 11/03/2014   Procedure: Left Heart Cath and Cors/Grafts Angiography;  Surgeon: [name], MD;  Location: [place] [facility];  Service: Cardiovascular;  Laterality: N/A;  . CARDIAC CATHETERIZATION N/A 11/03/2014   Procedure: Coronary Stent Intervention;  Surgeon: [name], MD;  Location: [place] [facility];  Service: Cardiovascular;  Laterality: N/A;  . CAROTID ENDARTERECTOMY  2002   "? side"  . CATARACT EXTRACTION W/ INTRAOCULAR LENS IMPLANT     "? side"  . CORONARY ANGIOPLASTY WITH STENT PLACEMENT  04/14/2017  . CORONARY ANGIOPLASTY WITH STENT PLACEMENT  11/03/2014   SVG PVA  . CORONARY ARTERY BYPASS GRAFT     with redo cabg  . EP  IMPLANTABLE DEVICE N/A 09/13/2014   Procedure: PPM Generator Changeout;  Surgeon: Deboraha Sprang, MD;  Location: [place] [facility];  Service: Cardiovascular;  Laterality: N/A;  . LEFT HEART CATH AND CORS/GRAFTS ANGIOGRAPHY N/A 04/14/2017   Procedure: LEFT HEART CATH AND CORS/GRAFTS ANGIOGRAPHY;  Surgeon: [name], MD;  Location: [place] [facility];  Service: Cardiovascular;  Laterality: N/A;  . MOHS SURGERY     "just outside my nose"  . PACEMAKER INSERTION  2007  . TONSILLECTOMY  1958    Current Medications: Current Meds  Medication Sig   . allopurinol (ZYLOPRIM) 100 MG tablet Take 100 mg by mouth daily.   Marland Kitchen amLODipine (NORVASC) 10 MG tablet Take 1 tablet (10 mg total) by mouth daily.  Marland Kitchen aspirin EC 81 MG EC tablet Take 81 mg by mouth daily.   . clopidogrel (PLAVIX) 75 MG tablet Take 1 tablet (75 mg total) by mouth daily.  . Evolocumab (REPATHA) 140 MG/ML SOSY Inject 140 mg into the skin every 14 (fourteen) days.   . furosemide (LASIX) 40 MG tablet Take 1 tablet (40 mg total) by mouth daily.  . isosorbide mononitrate (IMDUR) 120 MG 24 hr tablet Take 1 tablet (120 mg total) by mouth daily.  Marland Kitchen losartan (COZAAR) 100 MG tablet Take 1 tablet (100 mg total) by mouth 2 (two) times daily.  . metoprolol succinate (TOPROL-XL) 50 MG 24 hr tablet Take 1 tablet (50 mg total) by mouth 2 (two) times daily. Take with or immediately following meals.  Marland Kitchen NITROSTAT 0.4 MG SL tablet PLACE ONE TABLET UNDER THE TONGUE EVERY FIVE MINUTES AS NEEDED FOR CHEST PAIN (Patient taking differently: Place 0.4 mg under the tongue every 5 (five) minutes as needed for chest pain. )  . ranolazine (RANEXA) 1000 MG SR tablet Take 1 tablet (1,000 mg total) by mouth 2 (two) times daily.  . rosuvastatin (CRESTOR) 20 MG tablet Take 1 tablet (20 mg total) by mouth daily.  . tamsulosin (FLOMAX) 0.4 MG CAPS Take 0.4 mg by mouth 2 (two) times a day.      Allergies:   Patient has no known allergies.   Social History   Socioeconomic History  . Marital status: Married    Spouse name: Not on file  . Number of children: Not on file  . Years of education: Not on file  . Highest education level: Not on file  Occupational History  . Not on file  Tobacco Use  . Smoking status: Never Smoker  . Smokeless tobacco: Never Used  Vaping Use  . Vaping Use: Never used  Substance and Sexual Activity  . Alcohol use: Yes    Alcohol/week: 3.0 standard drinks    Types: 1 Glasses of wine, 1 Cans of beer, 1 Standard drinks or equivalent per week    Comment: \  . Drug use: No  .  Sexual activity: Not Currently  Other Topics Concern  . Not on file  Social History Narrative  . Not on file   Social Determinants of Health   Financial Resource Strain:   . Difficulty of Paying Living Expenses:   Food Insecurity:   . Worried About Charity fundraiser in the Last Year:   . Arboriculturist in the Last Year:   Transportation Needs:   . Film/video editor (Medical):   Marland Kitchen Lack of Transportation (Non-Medical):   Physical Activity:   . Days of Exercise per Week:   . Minutes of Exercise per Session:  Stress:   . Feeling of Stress :   Social Connections:   . Frequency of Communication with Friends and Family:   . Frequency of Social Gatherings with Friends and Family:   . Attends Religious Services:   . Active Member of Clubs or Organizations:   . Attends Archivist Meetings:   Marland Kitchen Marital Status:      Family History: [name] family history includes Cancer in his mother; Heart attack in his father; Heart disease in his father.  ROS:   Please see the history of present illness.    All other systems reviewed and are negative.  EKGs/Labs/Other Studies Reviewed:    The following studies were reviewed today: Echo 08/19/2019: IMPRESSIONS    1. Left ventricular ejection fraction, by estimation, is 45 to 50%. The  left ventricle has mildly decreased function. The left ventricle has no  regional wall motion abnormalities. Left ventricular diastolic parameters  are consistent with Grade II  diastolic dysfunction (pseudonormalization). Elevated left atrial  pressure. There is severe hypokinesis of the left ventricular, basal-mid  inferolateral wall.  2. Right ventricular systolic function is normal. The right ventricular  size is mildly enlarged. There is mildly elevated pulmonary artery  systolic pressure. The estimated right ventricular systolic pressure is  93.7 mmHg.  3. Left atrial size was moderately dilated.  4. Right atrial size was  moderately dilated.  5. The mitral valve is normal in structure. Mild mitral valve  regurgitation. No evidence of mitral stenosis.  6. The aortic valve is normal in structure. Aortic valve regurgitation is  trivial. No aortic stenosis is present.  7. The inferior vena cava is normal in size with greater than 50%  respiratory variability, suggesting right atrial pressure of 3 mmHg.   Comparison(s): Prior images reviewed side by side. The left ventricular  function is unchanged. The left ventricular wall motion abnormality is  unchanged. There are more prominent signs of increased mean left at   FINDINGS  Left Ventricle: Left ventricular ejection fraction, by estimation, is 45  to 50%. The left ventricle has mildly decreased function. The left  ventricle has no regional wall motion abnormalities. Severe hypokinesis of  the left ventricular, basal-mid  inferolateral wall. The left ventricular internal cavity size was normal  in size. There is no left ventricular hypertrophy. Abnormal (paradoxical)  septal motion, consistent with RV pacemaker. Left ventricular diastolic  parameters are consistent with  Grade II diastolic dysfunction (pseudonormalization). Elevated left atrial  pressure.   Right Ventricle: The right ventricular size is mildly enlarged. No  increase in right ventricular wall thickness. Right ventricular systolic  function is normal. There is mildly elevated pulmonary artery systolic  pressure. The tricuspid regurgitant  velocity is 2.67 m/s, and with an assumed right atrial pressure of 3 mmHg,  the estimated right ventricular systolic pressure is 16.9 mmHg.   Left Atrium: Left atrial size was moderately dilated.   Right Atrium: Right atrial size was moderately dilated.   Pericardium: There is no evidence of pericardial effusion.   Mitral Valve: The mitral valve is normal in structure. Normal mobility of  the mitral valve leaflets. Mild mitral valve regurgitation.  No evidence of  mitral valve stenosis.   Tricuspid Valve: The tricuspid valve is normal in structure. Tricuspid  valve regurgitation is trivial. No evidence of tricuspid stenosis.   Aortic Valve: The aortic valve is normal in structure. Aortic valve  regurgitation is trivial. No aortic stenosis is present.   Pulmonic Valve: The  pulmonic valve was normal in structure. Pulmonic valve  regurgitation is mild. No evidence of pulmonic stenosis.   Aorta: The aortic root is normal in size and structure.   Venous: The inferior vena cava is normal in size with greater than 50%  respiratory variability, suggesting right atrial pressure of 3 mmHg.   IAS/Shunts: No atrial level shunt detected by color flow Doppler.   Additional Comments: A pacer wire is visualized.   Cath 04/14/2017: Conclusion  1. Severe native vessel CAD With total occlusion of the ramus branch, LAD, and RCA 2. S/P CABG with continued patency of the LIMA-LAD and moderate stenosis at it's coronary anastomosis 3. Severe stenosis of the SVG-PDA treated successfully with DES implantation (distal in-stent restenosis) and de-novo mid vessel disease  Plan: hydration overnight, check metabolic panel in am, continue lifelong DAPT with ASA and clopidogrel.   Coronary Findings  Diagnostic Dominance: Right Left Main  LM lesion is 100% stenosed. The lesion is calcified.  Left Anterior Descending  Ost LAD lesion is 100% stenosed. The lesion is calcified.  Mid LAD lesion is 75% stenosed. lesion at LIMA anastomosis. slight progression from previous cath study.  Mid LAD to Dist LAD lesion is 40% stenosed. There is a 40% lesion in the LAD just beyond the graft insertion site. This does not appear to be flow-limiting.  Ramus Intermedius  Collaterals  Ramus filled by collaterals from 2nd Sept.    Ost Ramus to Ramus lesion is 100% stenosed.  Left Circumflex  Ost Cx to Mid Cx lesion is 20% stenosed. The lesion is segmental. The lesion  was previously treatedover 2 years ago.  First Obtuse Marginal Branch  1st Mrg lesion is 100% stenosed.  Right Coronary Artery  The RCA is known to be chronically occluded. The vessel is not selectively injected.  Ost RCA lesion is 100% stenosed.  Single Graft Graft To RPDA  and is large.  Prox Graft lesion is 60% stenosed.  Dist Graft lesion is 90% stenosed. The lesion is discrete. The lesion was previously treated using a drug eluting stent over 2 years ago. There is severe in-stent restenosis within the previously implanted stent in the distal body of the SVG  Single Graft Graft To 1st Mrg  Known to be occluded  Prox Graft lesion is 100% stenosed.  LIMA LIMA Graft To Mid LAD  LIMA and is normal in caliber. The graft exhibits no disease.  Intervention  Prox Graft lesion (Single Graft Graft To RPDA)  Stent  A drug-eluting stent was successfully placed using a STENT PROMUS PREM MR 4.0X16. Post-stent angioplasty was not performed. There is a moderate lesion in the mid-body of the graft, new from the previous study in 2016. The area appeared irregular after the distal lesion was treated. I elected to primarily stent this segment with a 4.0x16 mm Promus DES deployed at 14 atm. There is 10% residual stenosis and TIMI-3 flow at the completion.  Post-Intervention Lesion Assessment  There is a 10% residual stenosis post intervention.  Dist Graft lesion (Single Graft Graft To RPDA)  Stent  Lesion crossed with guidewire using a WIRE COUGAR XT STRL 190CM. A drug-eluting stent was successfully placed using a STENT PROMUS PREM MR 4.0X24. Stent overlaps previously placed stent. Post-stent angioplasty was performed using a BALLOON [place] EUPHORA [id number]. There is severe 90% stenosis within the previously implanted stent. Heparin is used for anticoagulation and a therapeutic ACT is achieved. A cougar wire is advanced beyond the lesion and the lesion is  dilated with a 2.5 mm balloon followed by a 4.84m  Wolverine atherectomy balloon. There was significant residual stenosis within and along the stent borders and I felt this to be a suboptimal result. The segment is then stented with a 4.0x24 mm Promus DES deployed at 16 atm and post-dilated with a 4.5 mm Clarke balloon to 16 atm with 0% residual stenosis.  Post-Intervention Lesion Assessment  There is a 0% residual stenosis post intervention.  Coronary Diagrams  Diagnostic Dominance: Right  Intervention    EKG:  EKG is ordered today.  The ekg ordered today demonstrates ventricular paced rhythm 61/min, atrial sensed  Recent Labs: No results found for requested labs within last 8760 hours.  Recent Lipid Panel    Component Value Date/Time   CHOL 159 02/12/2016 0803   TRIG 130 02/12/2016 0803   HDL 46 02/12/2016 0803   CHOLHDL 3.5 02/12/2016 0803   VLDL 26 02/12/2016 0803   LDLCALC 87 02/12/2016 0803    Physical Exam:    VS:  BP 124/78 (BP Location: Left Arm, Patient Position: Sitting, Cuff Size: Normal)   Pulse 61   Ht 5' 8"  (1.727 m)   Wt 171 lb 9.6 oz (77.8 kg)   SpO2 97%   BMI 26.09 kg/m     Wt Readings from Last 3 Encounters:  10/14/19 171 lb 9.6 oz (77.8 kg)  08/22/19 173 lb (78.5 kg)  02/23/19 171 lb 9.6 oz (77.8 kg)     GEN:  Well nourished, well developed in no acute distress HEENT: Normal NECK: No JVD; No carotid bruits LYMPHATICS: No lymphadenopathy CARDIAC: RRR, 2/6 systolic ejection murmur at the right upper sternal border RESPIRATORY:  Clear to auscultation without rales, wheezing or rhonchi  ABDOMEN: Soft, non-tender, non-distended MUSCULOSKELETAL:  No edema; No deformity  SKIN: Warm and dry NEUROLOGIC:  Alert and oriented x 3 PSYCHIATRIC:  Normal affect   ASSESSMENT:    1. Coronary artery disease involving native coronary artery of native heart with angina pectoris ([place]   2. CKD (chronic kidney disease) stage 4, GFR 15-29 ml/min (HCC)   3. Mixed hyperlipidemia   4. Essential hypertension    PLAN:     In order of problems listed above:  1. The patient has accelerating angina with CCS functional class III symptoms on a background of multi drug antianginal therapy with amlodipine 10 mg, isosorbide 120 mg, and ranolazine 1000 mg twice daily.  He also takes metoprolol succinate 50 mg twice daily.  I do not think he will tolerate escalation of his medical program.  I reviewed his previous catheterization films with him.  He has undergone multiple interventions in the saphenous vein graft to the RCA and he also had a moderate lesion at the LIMA to LAD anastomosis that was not treated.  His last study was about 2-1/2 years ago.  Cardiac catheterization and possible PCI are indicated.  He remains on dual antiplatelet therapy with aspirin and clopidogrel.  He is on aggressive lipid-lowering therapy. I have reviewed the risks, indications, and alternatives to cardiac catheterization, possible angioplasty, and stenting with the patient. Risks include but are not limited to bleeding, infection, vascular injury, stroke, myocardial infection, arrhythmia, kidney injury, radiation-related injury in the case of prolonged fluoroscopy use, emergency cardiac surgery, and death. The patient understands the risks of serious complication is 1-2 in 14665with diagnostic cardiac cath and 1-2% or less with angioplasty/stenting.  After reviewing his films and specifically the LIMA to LAD takeoff from the pedicled LIMA  graft, I would favor femoral artery access for his procedure as I think it will give [place] more percutaneous treatment options. 2. On appropriate therapy.  Check precath labs today.  Bring in early for hydration for the procedure. 3. Treated with aggressive medical therapy, LDL cholesterol has been at goal of less than 70 mg/dL. 4. Well controlled on multidrug therapy.   Medication Adjustments/Labs and Tests Ordered: Current medicines are reviewed at length with the patient today.  Concerns regarding medicines are  outlined above.  Orders Placed This Encounter  Procedures  . CBC with Differential/Platelet  . Basic metabolic panel  . EKG 12-Lead   No orders of the defined types were placed in this encounter.   Patient Instructions  You are scheduled for a Cardiac Catheterization on Tuesday, June 22 with Dr. [name].  1. Please arrive at the [facility] (Main Entrance A) at [facility]: [address] [place], [place] [address] at: 7:00AM. Free valet parking service is available. You are allowed ONE guest in the waiting room during your procedure. Both you and your visitor must wear masks. Special note: Every effort is made to have your procedure done on time. Please understand that emergencies sometimes delay scheduled procedures.  2. Diet: Do not eat solid foods after midnight.  You may have clear liquids until 5am upon the day of the procedure.  3. Labs: TODAY! BMET  4. Medication instructions in preparation for your procedure:  1) HOLD COZAAR day before and day of your cath  2) HOLD LASIX the day before and day of your procedure  3) MAKE SURE YOU TAKE ASPIRIN AND PLAVIX the morning of your cath  5. Plan for one night stay--bring personal belongings. 6. Bring a current list of your medications and current insurance cards. 7. You MUST have a responsible person to drive you home. 8. Someone MUST be with you the first 24 hours after you arrive home or your discharge will be delayed. 9. Please wear clothes that are easy to get on and off and wear slip-on shoes.  Thank you for allowing [place] to care for you!   -- [facility] Cardi[facility] services     Signed, [name], MD  10/14/2019 1:16 PM    [place]

## 2019-10-14 NOTE — Progress Notes (Signed)
Cardiology Office Note:    Date:  10/14/2019   ID:  DAVI KROON, DOB 06/03/1945, MRN [id number]  PCP:  [name], MD  [facility] Cardiologist:  [name], MD  [facility] Electrophysiologist:  [name], MD   Referring MD: [name], MD   Chief Complaint  Patient presents with  . Chest Pain    History of Present Illness:    Kyle JAGGERS is a 74 y.o. male with a hx of CAD and chronic angina, presenting for follow-up evaluation.The patient has an extensive coronary history with initial bypass surgery in 1988 followed by redo CABG in 1996. He has undergone multiple PCI procedures over the years following his CABG. His last PCI procedure in 2018 involved stenting of the saphenous vein graft to PDA. He also has carotid disease status post right carotid endarterectomy, complete heart block status post pacemaker, chronic kidney disease, hypertension and hyperlipidemia.The patient called in with increasing angina and is added on for office evaluation today.  He was last seen August 22, 2019 when his anginal symptoms were reported as stable.  The patient is here alone today.  He is having a lot of angina.  States that he is having substernal chest discomfort with walking short distances, doing light gardening, and with most other activities.  He is taking nitroglycerin frequently.  He was in [place] visiting his son this weekend and he had to take nitroglycerin every time they went for a walk.  He has mild exertional dyspnea associated with this.  No orthopnea, PND, or leg swelling.  No resting angina.  His pain does subside over several minutes after he rests.  Past Medical History:  Diagnosis Date  . Arthritis    "some in my back" (04/14/2017)  . CAD (coronary artery disease)    s/p CABG twice, PCI twice  . Carotid artery occlusion    s/p CEA  . Chronic renal insufficiency   . CKD (chronic kidney disease), stage III   . Exertional angina ([place])  11/03/2014  . Gout   . Heart block    s/p PTVDP  . HTN (hypertension)   . Hx of cardiovascular stress test    Adenosine Myoview (11/15):  Inferolateral and anterolateral infarct without significant ischemia, EF 42% - Intermediate Risk  . Hyperlipidemia   . 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 Mitral insufficiency   . Myocardial infarction ([place])    "I've had some mild one" (04/14/2017)  . Presence of permanent cardiac pacemaker   . Skin cancer    "nose"  . 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 ACHILLES TENDON REPAIR Right   . CARDIAC CATHETERIZATION    . CARDIAC CATHETERIZATION N/A 11/03/2014   Procedure: Left Heart Cath and Cors/Grafts Angiography;  Surgeon: [name], MD;  Location: [place] [facility];  Service: Cardiovascular;  Laterality: N/A;  . CARDIAC CATHETERIZATION N/A 11/03/2014   Procedure: Coronary Stent Intervention;  Surgeon: [name], MD;  Location: [place] [facility];  Service: Cardiovascular;  Laterality: N/A;  . CAROTID ENDARTERECTOMY  2002   "? side"  . CATARACT EXTRACTION W/ INTRAOCULAR LENS IMPLANT     "? side"  . CORONARY ANGIOPLASTY WITH STENT PLACEMENT  04/14/2017  . CORONARY ANGIOPLASTY WITH STENT PLACEMENT  11/03/2014   SVG PVA  . CORONARY ARTERY BYPASS GRAFT     with redo cabg  . EP  IMPLANTABLE DEVICE N/A 09/13/2014   Procedure: PPM Generator Changeout;  Surgeon: Deboraha Sprang, MD;  Location: [place] [facility];  Service: Cardiovascular;  Laterality: N/A;  . LEFT HEART CATH AND CORS/GRAFTS ANGIOGRAPHY N/A 04/14/2017   Procedure: LEFT HEART CATH AND CORS/GRAFTS ANGIOGRAPHY;  Surgeon: [name], MD;  Location: [place] [facility];  Service: Cardiovascular;  Laterality: N/A;  . MOHS SURGERY     "just outside my nose"  . PACEMAKER INSERTION  2007  . TONSILLECTOMY  1958    Current Medications: Current Meds  Medication Sig   . allopurinol (ZYLOPRIM) 100 MG tablet Take 100 mg by mouth daily.   Marland Kitchen amLODipine (NORVASC) 10 MG tablet Take 1 tablet (10 mg total) by mouth daily.  Marland Kitchen aspirin EC 81 MG EC tablet Take 81 mg by mouth daily.   . clopidogrel (PLAVIX) 75 MG tablet Take 1 tablet (75 mg total) by mouth daily.  . Evolocumab (REPATHA) 140 MG/ML SOSY Inject 140 mg into the skin every 14 (fourteen) days.   . furosemide (LASIX) 40 MG tablet Take 1 tablet (40 mg total) by mouth daily.  . isosorbide mononitrate (IMDUR) 120 MG 24 hr tablet Take 1 tablet (120 mg total) by mouth daily.  Marland Kitchen losartan (COZAAR) 100 MG tablet Take 1 tablet (100 mg total) by mouth 2 (two) times daily.  . metoprolol succinate (TOPROL-XL) 50 MG 24 hr tablet Take 1 tablet (50 mg total) by mouth 2 (two) times daily. Take with or immediately following meals.  Marland Kitchen NITROSTAT 0.4 MG SL tablet PLACE ONE TABLET UNDER THE TONGUE EVERY FIVE MINUTES AS NEEDED FOR CHEST PAIN (Patient taking differently: Place 0.4 mg under the tongue every 5 (five) minutes as needed for chest pain. )  . ranolazine (RANEXA) 1000 MG SR tablet Take 1 tablet (1,000 mg total) by mouth 2 (two) times daily.  . rosuvastatin (CRESTOR) 20 MG tablet Take 1 tablet (20 mg total) by mouth daily.  . tamsulosin (FLOMAX) 0.4 MG CAPS Take 0.4 mg by mouth 2 (two) times a day.      Allergies:   Patient has no known allergies.   Social History   Socioeconomic History  . Marital status: Married    Spouse name: Not on file  . Number of children: Not on file  . Years of education: Not on file  . Highest education level: Not on file  Occupational History  . Not on file  Tobacco Use  . Smoking status: Never Smoker  . Smokeless tobacco: Never Used  Vaping Use  . Vaping Use: Never used  Substance and Sexual Activity  . Alcohol use: Yes    Alcohol/week: 3.0 standard drinks    Types: 1 Glasses of wine, 1 Cans of beer, 1 Standard drinks or equivalent per week    Comment: \  . Drug use: No  .  Sexual activity: Not Currently  Other Topics Concern  . Not on file  Social History Narrative  . Not on file   Social Determinants of Health   Financial Resource Strain:   . Difficulty of Paying Living Expenses:   Food Insecurity:   . Worried About Charity fundraiser in the Last Year:   . Arboriculturist in the Last Year:   Transportation Needs:   . Film/video editor (Medical):   Marland Kitchen Lack of Transportation (Non-Medical):   Physical Activity:   . Days of Exercise per Week:   . Minutes of Exercise per Session:  Stress:   . Feeling of Stress :   Social Connections:   . Frequency of Communication with Friends and Family:   . Frequency of Social Gatherings with Friends and Family:   . Attends Religious Services:   . Active Member of Clubs or Organizations:   . Attends Archivist Meetings:   Marland Kitchen Marital Status:      Family History: [name] family history includes Cancer in his mother; Heart attack in his father; Heart disease in his father.  ROS:   Please see the history of present illness.    All other systems reviewed and are negative.  EKGs/Labs/Other Studies Reviewed:    The following studies were reviewed today: Echo 08/19/2019: IMPRESSIONS    1. Left ventricular ejection fraction, by estimation, is 45 to 50%. The  left ventricle has mildly decreased function. The left ventricle has no  regional wall motion abnormalities. Left ventricular diastolic parameters  are consistent with Grade II  diastolic dysfunction (pseudonormalization). Elevated left atrial  pressure. There is severe hypokinesis of the left ventricular, basal-mid  inferolateral wall.  2. Right ventricular systolic function is normal. The right ventricular  size is mildly enlarged. There is mildly elevated pulmonary artery  systolic pressure. The estimated right ventricular systolic pressure is  93.7 mmHg.  3. Left atrial size was moderately dilated.  4. Right atrial size was  moderately dilated.  5. The mitral valve is normal in structure. Mild mitral valve  regurgitation. No evidence of mitral stenosis.  6. The aortic valve is normal in structure. Aortic valve regurgitation is  trivial. No aortic stenosis is present.  7. The inferior vena cava is normal in size with greater than 50%  respiratory variability, suggesting right atrial pressure of 3 mmHg.   Comparison(s): Prior images reviewed side by side. The left ventricular  function is unchanged. The left ventricular wall motion abnormality is  unchanged. There are more prominent signs of increased mean left at   FINDINGS  Left Ventricle: Left ventricular ejection fraction, by estimation, is 45  to 50%. The left ventricle has mildly decreased function. The left  ventricle has no regional wall motion abnormalities. Severe hypokinesis of  the left ventricular, basal-mid  inferolateral wall. The left ventricular internal cavity size was normal  in size. There is no left ventricular hypertrophy. Abnormal (paradoxical)  septal motion, consistent with RV pacemaker. Left ventricular diastolic  parameters are consistent with  Grade II diastolic dysfunction (pseudonormalization). Elevated left atrial  pressure.   Right Ventricle: The right ventricular size is mildly enlarged. No  increase in right ventricular wall thickness. Right ventricular systolic  function is normal. There is mildly elevated pulmonary artery systolic  pressure. The tricuspid regurgitant  velocity is 2.67 m/s, and with an assumed right atrial pressure of 3 mmHg,  the estimated right ventricular systolic pressure is 90.2 mmHg.   Left Atrium: Left atrial size was moderately dilated.   Right Atrium: Right atrial size was moderately dilated.   Pericardium: There is no evidence of pericardial effusion.   Mitral Valve: The mitral valve is normal in structure. Normal mobility of  the mitral valve leaflets. Mild mitral valve regurgitation.  No evidence of  mitral valve stenosis.   Tricuspid Valve: The tricuspid valve is normal in structure. Tricuspid  valve regurgitation is trivial. No evidence of tricuspid stenosis.   Aortic Valve: The aortic valve is normal in structure. Aortic valve  regurgitation is trivial. No aortic stenosis is present.   Pulmonic Valve: The  pulmonic valve was normal in structure. Pulmonic valve  regurgitation is mild. No evidence of pulmonic stenosis.   Aorta: The aortic root is normal in size and structure.   Venous: The inferior vena cava is normal in size with greater than 50%  respiratory variability, suggesting right atrial pressure of 3 mmHg.   IAS/Shunts: No atrial level shunt detected by color flow Doppler.   Additional Comments: A pacer wire is visualized.   Cath 04/14/2017: Conclusion  1. Severe native vessel CAD With total occlusion of the ramus branch, LAD, and RCA 2. S/P CABG with continued patency of the LIMA-LAD and moderate stenosis at it's coronary anastomosis 3. Severe stenosis of the SVG-PDA treated successfully with DES implantation (distal in-stent restenosis) and de-novo mid vessel disease  Plan: hydration overnight, check metabolic panel in am, continue lifelong DAPT with ASA and clopidogrel.   Coronary Findings  Diagnostic Dominance: Right Left Main  LM lesion is 100% stenosed. The lesion is calcified.  Left Anterior Descending  Ost LAD lesion is 100% stenosed. The lesion is calcified.  Mid LAD lesion is 75% stenosed. lesion at LIMA anastomosis. slight progression from previous cath study.  Mid LAD to Dist LAD lesion is 40% stenosed. There is a 40% lesion in the LAD just beyond the graft insertion site. This does not appear to be flow-limiting.  Ramus Intermedius  Collaterals  Ramus filled by collaterals from 2nd Sept.    Ost Ramus to Ramus lesion is 100% stenosed.  Left Circumflex  Ost Cx to Mid Cx lesion is 20% stenosed. The lesion is segmental. The lesion  was previously treatedover 2 years ago.  First Obtuse Marginal Branch  1st Mrg lesion is 100% stenosed.  Right Coronary Artery  The RCA is known to be chronically occluded. The vessel is not selectively injected.  Ost RCA lesion is 100% stenosed.  Single Graft Graft To RPDA  and is large.  Prox Graft lesion is 60% stenosed.  Dist Graft lesion is 90% stenosed. The lesion is discrete. The lesion was previously treated using a drug eluting stent over 2 years ago. There is severe in-stent restenosis within the previously implanted stent in the distal body of the SVG  Single Graft Graft To 1st Mrg  Known to be occluded  Prox Graft lesion is 100% stenosed.  LIMA LIMA Graft To Mid LAD  LIMA and is normal in caliber. The graft exhibits no disease.  Intervention  Prox Graft lesion (Single Graft Graft To RPDA)  Stent  A drug-eluting stent was successfully placed using a STENT PROMUS PREM MR 4.0X16. Post-stent angioplasty was not performed. There is a moderate lesion in the mid-body of the graft, new from the previous study in 2016. The area appeared irregular after the distal lesion was treated. I elected to primarily stent this segment with a 4.0x16 mm Promus DES deployed at 14 atm. There is 10% residual stenosis and TIMI-3 flow at the completion.  Post-Intervention Lesion Assessment  There is a 10% residual stenosis post intervention.  Dist Graft lesion (Single Graft Graft To RPDA)  Stent  Lesion crossed with guidewire using a WIRE COUGAR XT STRL 190CM. A drug-eluting stent was successfully placed using a STENT PROMUS PREM MR 4.0X24. Stent overlaps previously placed stent. Post-stent angioplasty was performed using a BALLOON [place] EUPHORA [id number]. There is severe 90% stenosis within the previously implanted stent. Heparin is used for anticoagulation and a therapeutic ACT is achieved. A cougar wire is advanced beyond the lesion and the lesion is  dilated with a 2.5 mm balloon followed by a 4.22m  Wolverine atherectomy balloon. There was significant residual stenosis within and along the stent borders and I felt this to be a suboptimal result. The segment is then stented with a 4.0x24 mm Promus DES deployed at 16 atm and post-dilated with a 4.5 mm  balloon to 16 atm with 0% residual stenosis.  Post-Intervention Lesion Assessment  There is a 0% residual stenosis post intervention.  Coronary Diagrams  Diagnostic Dominance: Right  Intervention    EKG:  EKG is ordered today.  The ekg ordered today demonstrates ventricular paced rhythm 61/min, atrial sensed  Recent Labs: No results found for requested labs within last 8760 hours.  Recent Lipid Panel    Component Value Date/Time   CHOL 159 02/12/2016 0803   TRIG 130 02/12/2016 0803   HDL 46 02/12/2016 0803   CHOLHDL 3.5 02/12/2016 0803   VLDL 26 02/12/2016 0803   LDLCALC 87 02/12/2016 0803    Physical Exam:    VS:  BP 124/78 (BP Location: Left Arm, Patient Position: Sitting, Cuff Size: Normal)   Pulse 61   Ht 5' 8"  (1.727 m)   Wt 171 lb 9.6 oz (77.8 kg)   SpO2 97%   BMI 26.09 kg/m     Wt Readings from Last 3 Encounters:  10/14/19 171 lb 9.6 oz (77.8 kg)  08/22/19 173 lb (78.5 kg)  02/23/19 171 lb 9.6 oz (77.8 kg)     GEN:  Well nourished, well developed in no acute distress HEENT: Normal NECK: No JVD; No carotid bruits LYMPHATICS: No lymphadenopathy CARDIAC: RRR, 2/6 systolic ejection murmur at the right upper sternal border RESPIRATORY:  Clear to auscultation without rales, wheezing or rhonchi  ABDOMEN: Soft, non-tender, non-distended MUSCULOSKELETAL:  No edema; No deformity  SKIN: Warm and dry NEUROLOGIC:  Alert and oriented x 3 PSYCHIATRIC:  Normal affect   ASSESSMENT:    1. Coronary artery disease involving native coronary artery of native heart with angina pectoris ([place]   2. CKD (chronic kidney disease) stage 4, GFR 15-29 ml/min (HCC)   3. Mixed hyperlipidemia   4. Essential hypertension    PLAN:     In order of problems listed above:  1. The patient has accelerating angina with CCS functional class III symptoms on a background of multi drug antianginal therapy with amlodipine 10 mg, isosorbide 120 mg, and ranolazine 1000 mg twice daily.  He also takes metoprolol succinate 50 mg twice daily.  I do not think he will tolerate escalation of his medical program.  I reviewed his previous catheterization films with him.  He has undergone multiple interventions in the saphenous vein graft to the RCA and he also had a moderate lesion at the LIMA to LAD anastomosis that was not treated.  His last study was about 2-1/2 years ago.  Cardiac catheterization and possible PCI are indicated.  He remains on dual antiplatelet therapy with aspirin and clopidogrel.  He is on aggressive lipid-lowering therapy. I have reviewed the risks, indications, and alternatives to cardiac catheterization, possible angioplasty, and stenting with the patient. Risks include but are not limited to bleeding, infection, vascular injury, stroke, myocardial infection, arrhythmia, kidney injury, radiation-related injury in the case of prolonged fluoroscopy use, emergency cardiac surgery, and death. The patient understands the risks of serious complication is 1-2 in 12671with diagnostic cardiac cath and 1-2% or less with angioplasty/stenting.  After reviewing his films and specifically the LIMA to LAD takeoff from the pedicled LIMA  graft, I would favor femoral artery access for his procedure as I think it will give [place] more percutaneous treatment options. 2. On appropriate therapy.  Check precath labs today.  Bring in early for hydration for the procedure. 3. Treated with aggressive medical therapy, LDL cholesterol has been at goal of less than 70 mg/dL. 4. Well controlled on multidrug therapy.   Medication Adjustments/Labs and Tests Ordered: Current medicines are reviewed at length with the patient today.  Concerns regarding medicines are  outlined above.  Orders Placed This Encounter  Procedures  . CBC with Differential/Platelet  . Basic metabolic panel  . EKG 12-Lead   No orders of the defined types were placed in this encounter.   Patient Instructions  You are scheduled for a Cardiac Catheterization on Tuesday, June 22 with Dr. [name].  1. Please arrive at the [facility] (Main Entrance A) at [facility]: [address] [place], [place] [address] at: 7:00AM. Free valet parking service is available. You are allowed ONE guest in the waiting room during your procedure. Both you and your visitor must wear masks. Special note: Every effort is made to have your procedure done on time. Please understand that emergencies sometimes delay scheduled procedures.  2. Diet: Do not eat solid foods after midnight.  You may have clear liquids until 5am upon the day of the procedure.  3. Labs: TODAY! BMET  4. Medication instructions in preparation for your procedure:  1) HOLD COZAAR day before and day of your cath  2) HOLD LASIX the day before and day of your procedure  3) MAKE SURE YOU TAKE ASPIRIN AND PLAVIX the morning of your cath  5. Plan for one night stay--bring personal belongings. 6. Bring a current list of your medications and current insurance cards. 7. You MUST have a responsible person to drive you home. 8. Someone MUST be with you the first 24 hours after you arrive home or your discharge will be delayed. 9. Please wear clothes that are easy to get on and off and wear slip-on shoes.  Thank you for allowing [place] to care for you!   -- [facility] Invasive Cardiovascular services     Signed, [name], MD  10/14/2019 1:16 PM    [place]

## 2019-10-14 NOTE — Patient Instructions (Addendum)
You are scheduled for a Cardiac Catheterization on Tuesday, June 22 with Dr. [name].  1. Please arrive at the [facility] (Main Entrance A) at [facility]: [address] [place], [place] [address] at: 7:00AM. Free valet parking service is available. You are allowed ONE guest in the waiting room during your procedure. Both you and your visitor must wear masks. Special note: Every effort is made to have your procedure done on time. Please understand that emergencies sometimes delay scheduled procedures.  2. Diet: Do not eat solid foods after midnight.  You may have clear liquids until 5am upon the day of the procedure.  3. Labs: TODAY! BMET  4. Medication instructions in preparation for your procedure:  1) HOLD COZAAR day before and day of your cath  2) HOLD LASIX the day before and day of your procedure  3) MAKE SURE YOU TAKE ASPIRIN AND PLAVIX the morning of your cath  5. Plan for one night stay--bring personal belongings. 6. Bring a current list of your medications and current insurance cards. 7. You MUST have a responsible person to drive you home. 8. Someone MUST be with you the first 24 hours after you arrive home or your discharge will be delayed. 9. Please wear clothes that are easy to get on and off and wear slip-on shoes.  Thank you for allowing [place] to care for you!   -- [facility] Invasive Cardiovascular services

## 2019-10-17 ENCOUNTER — Telehealth: Payer: Self-pay | Admitting: *Deleted

## 2019-10-17 NOTE — Telephone Encounter (Addendum)
Pt contacted pre-catheterization scheduled at [facility] for: Tuesday October 18, 2019 10:30 AM Verified arrival time and place: [name]) at: 5:30 AM-pre-procedure hydration   No solid food after midnight prior to cath, clear liquids until 5 AM day of procedure.  Hold: Losartan-day before and day of procedure-GFR 29 Lasix-day before and day of procedure- GFR 29  Except hold medications AM meds can be  taken pre-cath with sips of water including: ASA 81 mg Plavix 75 mg  Confirmed patient has responsible adult to drive home post procedure and observe 24 hours after arriving home: yes  You are allowed ONE visitor in the waiting room during your procedure. Both you and your visitor must wear masks.      COVID-19 Pre-Screening Questions:  . In the past 7 to 10 days have you had a new cough, shortness of breath, headache, congestion, fever (100 or greater) unexplained body aches, new sore throat, or sudden loss of taste or sense of smell? no . In the past 7 to 10 days have you been around anyone with known Covid 19? no   Reviewed procedure/mask/visitor instructions, COVID-19 screening questions with patient.

## 2019-10-18 ENCOUNTER — Encounter (HOSPITAL_COMMUNITY): Payer: Self-pay | Admitting: Cardiovascular Disease

## 2019-10-18 ENCOUNTER — Other Ambulatory Visit: Payer: Self-pay

## 2019-10-18 ENCOUNTER — Ambulatory Visit (HOSPITAL_COMMUNITY)
Admission: RE | Disposition: A | Discharge status: Home / Self Care | Payer: Self-pay | Source: Home / Self Care | Attending: Cardiovascular Disease

## 2019-10-18 ENCOUNTER — Ambulatory Visit (HOSPITAL_COMMUNITY)
Admission: RE | Admit: 2019-10-18 | Discharge: 2019-10-19 | Disposition: A | Discharge status: Home / Self Care | Payer: Medicare HMO | Attending: Cardiovascular Disease | Admitting: Cardiovascular Disease

## 2019-10-18 DIAGNOSIS — Z950 Presence of cardiac pacemaker: Secondary | ICD-10-CM | POA: Diagnosis not present

## 2019-10-18 DIAGNOSIS — I25118 Atherosclerotic heart disease of native coronary artery with other forms of angina pectoris: Secondary | ICD-10-CM | POA: Insufficient documentation

## 2019-10-18 DIAGNOSIS — Z951 Presence of aortocoronary bypass graft: Secondary | ICD-10-CM | POA: Diagnosis not present

## 2019-10-18 DIAGNOSIS — M109 Gout, unspecified: Secondary | ICD-10-CM | POA: Diagnosis not present

## 2019-10-18 DIAGNOSIS — I10 Essential (primary) hypertension: Secondary | ICD-10-CM | POA: Diagnosis present

## 2019-10-18 DIAGNOSIS — Z79899 Other long term (current) drug therapy: Secondary | ICD-10-CM | POA: Diagnosis not present

## 2019-10-18 DIAGNOSIS — E782 Mixed hyperlipidemia: Secondary | ICD-10-CM | POA: Diagnosis not present

## 2019-10-18 DIAGNOSIS — I2089 Other forms of angina pectoris: Secondary | ICD-10-CM | POA: Diagnosis present

## 2019-10-18 DIAGNOSIS — I252 Old myocardial infarction: Secondary | ICD-10-CM | POA: Insufficient documentation

## 2019-10-18 DIAGNOSIS — I209 Angina pectoris, unspecified: Secondary | ICD-10-CM | POA: Diagnosis present

## 2019-10-18 DIAGNOSIS — I208 Other forms of angina pectoris: Secondary | ICD-10-CM | POA: Diagnosis present

## 2019-10-18 DIAGNOSIS — I2582 Chronic total occlusion of coronary artery: Secondary | ICD-10-CM | POA: Diagnosis not present

## 2019-10-18 DIAGNOSIS — N184 Chronic kidney disease, stage 4 (severe): Secondary | ICD-10-CM | POA: Diagnosis not present

## 2019-10-18 DIAGNOSIS — M329 Systemic lupus erythematosus, unspecified: Secondary | ICD-10-CM | POA: Diagnosis not present

## 2019-10-18 DIAGNOSIS — Z7902 Long term (current) use of antithrombotics/antiplatelets: Secondary | ICD-10-CM | POA: Insufficient documentation

## 2019-10-18 DIAGNOSIS — Z955 Presence of coronary angioplasty implant and graft: Secondary | ICD-10-CM

## 2019-10-18 DIAGNOSIS — Z7982 Long term (current) use of aspirin: Secondary | ICD-10-CM | POA: Insufficient documentation

## 2019-10-18 DIAGNOSIS — I129 Hypertensive chronic kidney disease with stage 1 through stage 4 chronic kidney disease, or unspecified chronic kidney disease: Secondary | ICD-10-CM | POA: Diagnosis not present

## 2019-10-18 DIAGNOSIS — I2510 Atherosclerotic heart disease of native coronary artery without angina pectoris: Secondary | ICD-10-CM | POA: Diagnosis present

## 2019-10-18 DIAGNOSIS — E785 Hyperlipidemia, unspecified: Secondary | ICD-10-CM | POA: Diagnosis present

## 2019-10-18 HISTORY — PX: CORONARY/GRAFT ANGIOGRAPHY: CATH118237

## 2019-10-18 HISTORY — PX: CORONARY STENT INTERVENTION: CATH118234

## 2019-10-18 LAB — POCT ACTIVATED CLOTTING TIME: Activated Clotting Time: 373 seconds

## 2019-10-18 SURGERY — CORONARY/GRAFT ANGIOGRAPHY
Anesthesia: LOCAL

## 2019-10-18 MED ORDER — MIDAZOLAM HCL 2 MG/2ML IJ SOLN
INTRAMUSCULAR | Status: AC
  Filled 2019-10-18: qty 2

## 2019-10-18 MED ORDER — LIDOCAINE HCL (PF) 1 % IJ SOLN
INTRAMUSCULAR | Status: AC
  Filled 2019-10-18: qty 30

## 2019-10-18 MED ORDER — ACETAMINOPHEN 325 MG PO TABS: 650.0000 mg | ORAL_TABLET | ORAL | Status: DC | PRN

## 2019-10-18 MED ORDER — TAMSULOSIN HCL 0.4 MG PO CAPS
0.4000 mg | ORAL_CAPSULE | Freq: Every day | ORAL | Status: DC
  Administered 2019-10-19: 0.4 mg via ORAL
  Filled 2019-10-18 (×2): qty 1

## 2019-10-18 MED ORDER — CLOPIDOGREL BISULFATE 75 MG PO TABS
75.0000 mg | ORAL_TABLET | Freq: Every day | ORAL | Status: DC
  Administered 2019-10-19: 75 mg via ORAL
  Filled 2019-10-18: qty 1

## 2019-10-18 MED ORDER — METOPROLOL SUCCINATE ER 50 MG PO TB24
50.0000 mg | ORAL_TABLET | Freq: Two times a day (BID) | ORAL | Status: DC
  Administered 2019-10-18 – 2019-10-19 (×2): 50 mg via ORAL
  Filled 2019-10-18 (×2): qty 1

## 2019-10-18 MED ORDER — SODIUM CHLORIDE 0.9% FLUSH: 3.0000 mL | INTRAVENOUS | Status: DC | PRN

## 2019-10-18 MED ORDER — HEPARIN (PORCINE) IN NACL 1000-0.9 UT/500ML-% IV SOLN
INTRAVENOUS | Status: AC
  Filled 2019-10-18: qty 1000

## 2019-10-18 MED ORDER — IOHEXOL 350 MG/ML SOLN
INTRAVENOUS | Status: DC | PRN
  Administered 2019-10-18: 80 mL via INTRA_ARTERIAL

## 2019-10-18 MED ORDER — NITROGLYCERIN 1 MG/10 ML FOR IR/CATH LAB
INTRA_ARTERIAL | Status: AC
  Filled 2019-10-18: qty 10

## 2019-10-18 MED ORDER — ALLOPURINOL 100 MG PO TABS
100.0000 mg | ORAL_TABLET | Freq: Every day | ORAL | Status: DC
  Administered 2019-10-19: 100 mg via ORAL
  Filled 2019-10-18: qty 1

## 2019-10-18 MED ORDER — SODIUM CHLORIDE 0.9 % IV SOLN: 250.0000 mL | INTRAVENOUS | Status: DC | PRN

## 2019-10-18 MED ORDER — ONDANSETRON HCL 4 MG/2ML IJ SOLN: 4.0000 mg | Freq: Four times a day (QID) | INTRAMUSCULAR | Status: DC | PRN

## 2019-10-18 MED ORDER — FENTANYL CITRATE (PF) 100 MCG/2ML IJ SOLN
INTRAMUSCULAR | Status: AC
  Filled 2019-10-18: qty 2

## 2019-10-18 MED ORDER — SODIUM CHLORIDE 0.9 % WEIGHT BASED INFUSION
3.0000 mL/kg/h | INTRAVENOUS | Status: DC
  Administered 2019-10-18: 3 mL/kg/h via INTRAVENOUS

## 2019-10-18 MED ORDER — FENTANYL CITRATE (PF) 100 MCG/2ML IJ SOLN
INTRAMUSCULAR | Status: DC | PRN
  Administered 2019-10-18: 25 ug via INTRAVENOUS
  Administered 2019-10-18: 50 ug via INTRAVENOUS
  Administered 2019-10-18: 25 ug via INTRAVENOUS

## 2019-10-18 MED ORDER — NITROGLYCERIN 1 MG/10 ML FOR IR/CATH LAB
INTRA_ARTERIAL | Status: DC | PRN
  Administered 2019-10-18 (×2): 200 ug via INTRACORONARY

## 2019-10-18 MED ORDER — SODIUM CHLORIDE 0.9 % WEIGHT BASED INFUSION
1.0000 mL/kg/h | INTRAVENOUS | Status: DC
  Administered 2019-10-18: 1 mL/kg/h via INTRAVENOUS

## 2019-10-18 MED ORDER — ASPIRIN 81 MG PO CHEW
81.0000 mg | CHEWABLE_TABLET | Freq: Every day | ORAL | Status: DC
  Administered 2019-10-19: 81 mg via ORAL
  Filled 2019-10-18: qty 1

## 2019-10-18 MED ORDER — HYDRALAZINE HCL 20 MG/ML IJ SOLN: 10.0000 mg | INTRAMUSCULAR | Status: AC | PRN

## 2019-10-18 MED ORDER — ROSUVASTATIN CALCIUM 20 MG PO TABS
20.0000 mg | ORAL_TABLET | Freq: Every day | ORAL | Status: DC
  Administered 2019-10-19: 20 mg via ORAL
  Filled 2019-10-18: qty 1

## 2019-10-18 MED ORDER — EVOLOCUMAB 140 MG/ML ~~LOC~~ SOSY: 140.0000 mg | PREFILLED_SYRINGE | SUBCUTANEOUS | Status: DC

## 2019-10-18 MED ORDER — LIDOCAINE HCL (PF) 1 % IJ SOLN
INTRAMUSCULAR | Status: DC | PRN
  Administered 2019-10-18: 30 mL via INTRADERMAL

## 2019-10-18 MED ORDER — SODIUM CHLORIDE 0.9 % WEIGHT BASED INFUSION
1.0000 mL/kg/h | INTRAVENOUS | Status: AC
  Administered 2019-10-18: 1 mL/kg/h via INTRAVENOUS

## 2019-10-18 MED ORDER — RANOLAZINE ER 500 MG PO TB12
1000.0000 mg | ORAL_TABLET | Freq: Two times a day (BID) | ORAL | Status: DC
  Administered 2019-10-18 – 2019-10-19 (×2): 1000 mg via ORAL
  Filled 2019-10-18 (×2): qty 2

## 2019-10-18 MED ORDER — LABETALOL HCL 5 MG/ML IV SOLN: 10.0000 mg | INTRAVENOUS | Status: AC | PRN

## 2019-10-18 MED ORDER — ASPIRIN 81 MG PO CHEW: 81.0000 mg | CHEWABLE_TABLET | ORAL | Status: DC

## 2019-10-18 MED ORDER — BIVALIRUDIN TRIFLUOROACETATE 250 MG IV SOLR
INTRAVENOUS | Status: AC
  Filled 2019-10-18: qty 250

## 2019-10-18 MED ORDER — ISOSORBIDE MONONITRATE ER 60 MG PO TB24
120.0000 mg | ORAL_TABLET | Freq: Every day | ORAL | Status: DC
  Administered 2019-10-19: 120 mg via ORAL
  Filled 2019-10-18 (×2): qty 2

## 2019-10-18 MED ORDER — VERAPAMIL HCL 2.5 MG/ML IV SOLN
INTRAVENOUS | Status: AC
  Filled 2019-10-18: qty 2

## 2019-10-18 MED ORDER — SODIUM CHLORIDE 0.9% FLUSH
3.0000 mL | Freq: Two times a day (BID) | INTRAVENOUS | Status: DC
  Administered 2019-10-19: 3 mL via INTRAVENOUS

## 2019-10-18 MED ORDER — IOHEXOL 350 MG/ML SOLN
INTRAVENOUS | Status: AC
  Filled 2019-10-18: qty 1

## 2019-10-18 MED ORDER — HEPARIN SODIUM (PORCINE) 1000 UNIT/ML IJ SOLN
INTRAMUSCULAR | Status: AC
  Filled 2019-10-18: qty 1

## 2019-10-18 MED ORDER — BIVALIRUDIN BOLUS VIA INFUSION - CUPID
INTRAVENOUS | Status: DC | PRN
  Administered 2019-10-18: 56.85 mg via INTRAVENOUS

## 2019-10-18 MED ORDER — SODIUM CHLORIDE 0.9% FLUSH: 3.0000 mL | Freq: Two times a day (BID) | INTRAVENOUS | Status: DC

## 2019-10-18 MED ORDER — PANTOPRAZOLE SODIUM 40 MG PO TBEC
40.0000 mg | DELAYED_RELEASE_TABLET | Freq: Every day | ORAL | Status: DC
  Administered 2019-10-19: 40 mg via ORAL
  Filled 2019-10-18: qty 1

## 2019-10-18 MED ORDER — HEPARIN (PORCINE) IN NACL 1000-0.9 UT/500ML-% IV SOLN
INTRAVENOUS | Status: DC | PRN
  Administered 2019-10-18 (×2): 500 mL

## 2019-10-18 MED ORDER — MIDAZOLAM HCL 2 MG/2ML IJ SOLN
INTRAMUSCULAR | Status: DC | PRN
  Administered 2019-10-18: 1 mg via INTRAVENOUS
  Administered 2019-10-18: 2 mg via INTRAVENOUS

## 2019-10-18 MED ORDER — AMLODIPINE BESYLATE 10 MG PO TABS
10.0000 mg | ORAL_TABLET | Freq: Every day | ORAL | Status: DC
  Administered 2019-10-19: 10 mg via ORAL
  Filled 2019-10-18: qty 1

## 2019-10-18 MED ORDER — CLOPIDOGREL BISULFATE 75 MG PO TABS: 75.0000 mg | ORAL_TABLET | ORAL | Status: DC

## 2019-10-18 MED ORDER — SODIUM CHLORIDE 0.9 % IV SOLN
INTRAVENOUS | Status: DC | PRN
  Administered 2019-10-18: 1 mg/kg/h via INTRAVENOUS

## 2019-10-18 SURGICAL SUPPLY — 22 items
BALLN SAPPHIRE 2.5X15 (BALLOONS) ×2
BALLN SAPPHIRE ~~LOC~~ 4.0X18 (BALLOONS) ×2 IMPLANT
BALLN WOLVERINE 4.00X10 (BALLOONS) ×2
BALLOON SAPPHIRE 2.5X15 (BALLOONS) ×1 IMPLANT
BALLOON WOLVERINE 4.00X10 (BALLOONS) ×1 IMPLANT
CATH EXPO 5F MPA-1 (CATHETERS) ×2 IMPLANT
CATH INFINITI 5 FR IM (CATHETERS) ×2 IMPLANT
CATH INFINITI 5FR MULTPACK ANG (CATHETERS) ×2 IMPLANT
CATHETER LAUNCHER 6FR MP1 (CATHETERS) ×2 IMPLANT
DEVICE CLOSURE PERCLS PRGLD 6F (VASCULAR PRODUCTS) ×1 IMPLANT
KIT ENCORE 26 ADVANTAGE (KITS) ×2 IMPLANT
KIT HEART LEFT (KITS) ×2 IMPLANT
PACK CARDIAC CATHETERIZATION (CUSTOM PROCEDURE TRAY) ×2 IMPLANT
PERCLOSE PROGLIDE 6F (VASCULAR PRODUCTS) ×2
SHEATH PINNACLE 5F 10CM (SHEATH) ×2 IMPLANT
SHEATH PINNACLE 6F 10CM (SHEATH) ×2 IMPLANT
SHEATH PROBE COVER 6X72 (BAG) ×2 IMPLANT
STENT RESOLUTE ONYX 2.5X12 (Permanent Stent) ×2 IMPLANT
TRANSDUCER W/STOPCOCK (MISCELLANEOUS) ×2 IMPLANT
TUBING CIL FLEX 10 FLL-RA (TUBING) ×2 IMPLANT
WIRE COUGAR XT STRL 190CM (WIRE) ×2 IMPLANT
WIRE EMERALD 3MM-J .035X150CM (WIRE) ×2 IMPLANT

## 2019-10-18 NOTE — Interval H&P Note (Signed)
Cath Lab Visit (complete for each Cath Lab visit)  Clinical Evaluation Leading to the Procedure:   ACS: Yes.    Non-ACS:    Anginal Classification: CCS III  Anti-ischemic medical therapy: Maximal Therapy (2 or more classes of medications)  Non-Invasive Test Results: No non-invasive testing performed  Prior CABG: Previous CABG      History and Physical Interval Note:  10/18/2019 10:22 AM  Kyle Avila  has presented today for surgery, with the diagnosis of CAD.  The various methods of treatment have been discussed with the patient and family. After consideration of risks, benefits and other options for treatment, the patient has consented to  Procedure(s): LEFT HEART CATH AND CORS/GRAFTS ANGIOGRAPHY (N/A) as a surgical intervention.  [name] history has been reviewed, patient examined, no change in status, stable for surgery.  I have reviewed the patient's chart and labs.  Questions were answered to the patient's satisfaction.     [name]

## 2019-10-18 NOTE — Plan of Care (Signed)
  Problem: Safety: Goal: Ability to remain free from injury will improve Outcome: Progressing   Problem: Pain Managment: Goal: General experience of comfort will improve Outcome: Progressing   

## 2019-10-19 ENCOUNTER — Encounter (HOSPITAL_COMMUNITY): Payer: Self-pay | Admitting: Cardiovascular Disease

## 2019-10-19 DIAGNOSIS — I208 Other forms of angina pectoris: Secondary | ICD-10-CM | POA: Diagnosis not present

## 2019-10-19 DIAGNOSIS — M109 Gout, unspecified: Secondary | ICD-10-CM | POA: Diagnosis not present

## 2019-10-19 DIAGNOSIS — Z955 Presence of coronary angioplasty implant and graft: Secondary | ICD-10-CM

## 2019-10-19 DIAGNOSIS — I25110 Atherosclerotic heart disease of native coronary artery with unstable angina pectoris: Secondary | ICD-10-CM

## 2019-10-19 DIAGNOSIS — N184 Chronic kidney disease, stage 4 (severe): Secondary | ICD-10-CM | POA: Diagnosis not present

## 2019-10-19 DIAGNOSIS — E782 Mixed hyperlipidemia: Secondary | ICD-10-CM | POA: Diagnosis not present

## 2019-10-19 DIAGNOSIS — I2582 Chronic total occlusion of coronary artery: Secondary | ICD-10-CM | POA: Diagnosis not present

## 2019-10-19 DIAGNOSIS — I10 Essential (primary) hypertension: Secondary | ICD-10-CM

## 2019-10-19 DIAGNOSIS — Z950 Presence of cardiac pacemaker: Secondary | ICD-10-CM | POA: Diagnosis not present

## 2019-10-19 DIAGNOSIS — M329 Systemic lupus erythematosus, unspecified: Secondary | ICD-10-CM | POA: Diagnosis not present

## 2019-10-19 DIAGNOSIS — I252 Old myocardial infarction: Secondary | ICD-10-CM | POA: Diagnosis not present

## 2019-10-19 DIAGNOSIS — I129 Hypertensive chronic kidney disease with stage 1 through stage 4 chronic kidney disease, or unspecified chronic kidney disease: Secondary | ICD-10-CM | POA: Diagnosis not present

## 2019-10-19 DIAGNOSIS — I25118 Atherosclerotic heart disease of native coronary artery with other forms of angina pectoris: Secondary | ICD-10-CM | POA: Diagnosis not present

## 2019-10-19 LAB — LIPID PANEL
Cholesterol: 90 mg/dL (ref 0–200)
HDL: 37 mg/dL — ABNORMAL LOW (ref >40)
LDL Cholesterol: NEGATIVE mg/dL (ref 0–99)
Total CHOL/HDL Ratio: 2.4 RATIO
Triglycerides: 275 mg/dL — ABNORMAL HIGH (ref <150)
VLDL: 55 mg/dL — ABNORMAL HIGH (ref 0–40)

## 2019-10-19 LAB — CBC
HCT: 38.7 % — ABNORMAL LOW (ref 39.0–52.0)
HCT: 39.8 % (ref 39.0–52.0)
Hemoglobin: 12.8 g/dL — ABNORMAL LOW (ref 13.0–17.0)
Hemoglobin: 13.1 g/dL (ref 13.0–17.0)
MCH: 35.4 pg — ABNORMAL HIGH (ref 26.0–34.0)
MCH: 35.6 pg — ABNORMAL HIGH (ref 26.0–34.0)
MCHC: 33.1 g/dL (ref 30.0–36.0)
MCHC: 32.9 g/dL (ref 30.0–36.0)
MCV: 106.9 fL — ABNORMAL HIGH (ref 80.0–100.0)
MCV: 108.2 fL — ABNORMAL HIGH (ref 80.0–100.0)
Platelets: 160 10*3/uL (ref 150–400)
Platelets: 161 10*3/uL (ref 150–400)
RBC: 3.62 MIL/uL — ABNORMAL LOW (ref 4.22–5.81)
RBC: 3.68 MIL/uL — ABNORMAL LOW (ref 4.22–5.81)
RDW: 12.2 % (ref 11.5–15.5)
RDW: 12.3 % (ref 11.5–15.5)
WBC: 4.4 10*3/uL (ref 4.0–10.5)
WBC: 5.2 10*3/uL (ref 4.0–10.5)
nRBC: 0 % (ref 0.0–0.2)
nRBC: 0 % (ref 0.0–0.2)

## 2019-10-19 LAB — BASIC METABOLIC PANEL
Anion gap: 6 (ref 5–15)
BUN: 34 mg/dL — ABNORMAL HIGH (ref 8–23)
CO2: 24 mmol/L (ref 22–32)
Calcium: 8.5 mg/dL — ABNORMAL LOW (ref 8.9–10.3)
Chloride: 109 mmol/L (ref 98–111)
Creatinine, Ser: 1.99 mg/dL — ABNORMAL HIGH (ref 0.61–1.24)
GFR calc Af Amer: 37 mL/min — ABNORMAL LOW (ref >60)
GFR calc non Af Amer: 32 mL/min — ABNORMAL LOW (ref >60)
Glucose, Bld: 104 mg/dL — ABNORMAL HIGH (ref 70–99)
Potassium: 4.5 mmol/L (ref 3.5–5.1)
Sodium: 139 mmol/L (ref 135–145)

## 2019-10-19 LAB — TROPONIN I (HIGH SENSITIVITY)
Troponin I (High Sensitivity): 77 ng/L — ABNORMAL HIGH (ref <18)
Troponin I (High Sensitivity): 66 ng/L — ABNORMAL HIGH (ref <18)
Troponin I (High Sensitivity): 66 ng/L — ABNORMAL HIGH (ref <18)
Troponin I (High Sensitivity): 67 ng/L — ABNORMAL HIGH (ref <18)

## 2019-10-19 MED ORDER — ANGIOPLASTY BOOK
Freq: Once | Status: DC
  Filled 2019-10-19: qty 1

## 2019-10-19 MED ORDER — ANGIOPLASTY BOOK: 1.0000 | Freq: Once | 0 refills | Status: AC

## 2019-10-19 MED FILL — Heparin Sodium (Porcine) Inj 1000 Unit/ML: INTRAMUSCULAR | Qty: 10 | Status: AC

## 2019-10-19 NOTE — Progress Notes (Signed)
Progress Note  Patient Name: Kyle Avila Date of Encounter: 10/19/2019  Primary Cardiologist: [name], MD   Subjective   Doing well. No chest pain. Ambulated in the halls multiple times with no issues. Groin site stable   Inpatient Medications    Scheduled Meds: . allopurinol  100 mg Oral Daily  . amLODipine  10 mg Oral Daily  . aspirin  81 mg Oral Daily  . clopidogrel  75 mg Oral Daily  . isosorbide mononitrate  120 mg Oral Daily  . metoprolol succinate  50 mg Oral BID  . pantoprazole  40 mg Oral Daily  . ranolazine  1,000 mg Oral BID  . rosuvastatin  20 mg Oral Daily  . sodium chloride flush  3 mL Intravenous Q12H  . sodium chloride flush  3 mL Intravenous Q12H  . tamsulosin  0.4 mg Oral Daily   Continuous Infusions: . sodium chloride     PRN Meds: sodium chloride, acetaminophen, ondansetron (ZOFRAN) IV, sodium chloride flush   Vital Signs    Vitals:   10/19/19 0024 10/19/19 0213 10/19/19 0432 10/19/19 0435  BP: 140/67  (!) 146/75 (!) 146/75  Pulse: 64  61   Resp: 16  18   Temp: 98.2 F (36.8 C)  97.7 F (36.5 C)   TempSrc: Oral  Oral   SpO2: 97%  97%   Weight:  77.3 kg    Height:        Intake/Output Summary (Last 24 hours) at 10/19/2019 1102 Last data filed at 10/19/2019 0300 Gross per 24 hour  Intake 2457.02 ml  Output 2100 ml  Net 357.02 ml   Filed Weights   10/18/19 0546 10/18/19 1425 10/19/19 0213  Weight: 75.8 kg 79.8 kg 77.3 kg    Physical Exam   General: Well developed, well nourished, NAD Neck: Negative for carotid bruits. No JVD Lungs:Clear to ausculation bilaterally. No wheezes, rales, or rhonchi. Breathing is unlabored. Cardiovascular: RRR with S1 S2. + murmurs Abdomen: Soft, non-tender, non-distended. No obvious abdominal masses. Extremities: No edema.. Radial  pulses 2+ bilaterally Neuro: Alert and oriented. No focal deficits. No facial asymmetry. MAE spontaneously. Psych: Responds to questions appropriately with  normal affect.    Labs    Chemistry Recent Labs  Lab 10/14/19 0900 10/19/19 0306  NA 140 139  K 4.8 4.5  CL 103 109  CO2 30* 24  GLUCOSE 126* 104*  BUN 44* 34*  CREATININE 2.16* 1.99*  CALCIUM 9.5 8.5*  GFRNONAA 29* 32*  GFRAA 34* 37*  ANIONGAP  --  6     Hematology Recent Labs  Lab 10/14/19 0900 10/19/19 0306  WBC 3.9 4.4  RBC 3.74* 3.62*  HGB 13.2 12.8*  HCT 39.0 38.7*  MCV 104* 106.9*  MCH 35.3* 35.4*  MCHC 33.8 33.1  RDW 13.4 12.2  PLT 176 160    Cardiac EnzymesNo results for input(s): TROPONINI in the last 168 hours. No results for input(s): TROPIPOC in the last 168 hours.   BNPNo results for input(s): BNP, PROBNP in the last 168 hours.   DDimer No results for input(s): DDIMER in the last 168 hours.   Radiology    CARDIAC CATHETERIZATION  Result Date: 10/18/2019 1.  Severe native vessel coronary artery disease with total occlusion of the LAD and native RCA, continued patency of the stented segment in the proximal circumflex 2.  Continued patency of the LIMA to LAD with stable moderate stenosis of 70% at the LIMA anastomotic site unchanged  from previous studies 3.  Extensive severe disease within the previously stented segment of the saphenous vein graft to PDA as well as severe de novo stenosis just beyond the distal anastomotic site in the PDA, both lesions treated with PCI.  Successful stenting of the PDA and successful angioplasty and scoring balloon angioplasty of the saphenous vein graft within the previously implanted stents. Recommend: Long-term dual antiplatelet therapy with aspirin and clopidogrel.  Postprocedural hydration in this patient with significant kidney disease.  Repeat metabolic panel tomorrow morning.  Discharge tomorrow if no complications arise.  Telemetry    10/19/19 NSR- Personally Reviewed  ECG    No new tracing as of 10/19/19- Personally Reviewed  Cardiac Studies   LHC 10/18/19:   1.  Severe native vessel coronary artery  disease with total occlusion of the LAD and native RCA, continued patency of the stented segment in the proximal circumflex 2.  Continued patency of the LIMA to LAD with stable moderate stenosis of 70% at the LIMA anastomotic site unchanged from previous studies 3.  Extensive severe disease within the previously stented segment of the saphenous vein graft to PDA as well as severe de novo stenosis just beyond the distal anastomotic site in the PDA, both lesions treated with PCI.  Successful stenting of the PDA and successful angioplasty and scoring balloon angioplasty of the saphenous vein graft within the previously implanted stents.  Recommend: Long-term dual antiplatelet therapy with aspirin and clopidogrel. Postprocedural hydration in this patient with significant kidney disease.  Repeat metabolic panel tomorrow morning.  Discharge tomorrow if no complications arise.  Patient Profile     74 y.o. male  with a hx of CAD and chronic angina, presenting for follow-up evaluation.The patient has an extensive coronary history with initial bypass surgery in 1988 followed by redo CABG in 1996. He has undergone multiple PCI procedures over the years following his CABG. His last PCI procedure in 2018 involved stenting of the saphenous vein graft to PDA. He also has carotid disease status post right carotid endarterectomy, complete heart block status post pacemaker, chronic kidney disease, hypertension and hyperlipidemia.The patient called in with increasing angina and is added on for office evaluation 10/18/19 with plans for [facility].   Assessment & Plan    1. CAD: -LHC 10/18/19 with severe native vessel coronary artery disease with total occlusion of the LAD and native RCA, continued patency of the stented segment in the proximal circumflex. Continued patency of the LIMA to LAD with stable moderate stenosis of 70% at the LIMA anastomotic site unchanged from previous studies. Extensive severe disease within the  previously stented segment of the saphenous vein graft to PDA as well as severe de novo stenosis just beyond the distal anastomotic site in the PDA, both lesions treated with PCI.  Successful stenting of the PDA and successful angioplasty and scoring balloon angioplasty of the saphenous vein graft within the previously implanted stents. -Plan for long-term dual antiplatelet therapy with aspirin and clopidogrel.   2. CKD stage IV: -Creatinine 1.99 today with a baseline that appears to be in the 2.0 range  3. HLD: -Currently treated with aggressive medical therapy with goal of <70 -LDL found to be (-2) calculated. Per Dr. Debara Pickett would drop Crestor and continue Repatha  4. HTN: -Stable, 146/75>140/67>123/91 -Continue current regimen   Anticipate d/c today    Signed, Kathyrn Drown NP-C [facility] Pager: [phone] 10/19/2019, 11:02 AM     For questions or updates, please contact   Please consult [email or web address] for  contact info under Cardiology/STEMI.

## 2019-10-19 NOTE — Progress Notes (Signed)
Patient c/o difficulty voiding. Insists on taking Flomax, now verses 1000a. Within 10hrs of shift patient has voided 1000cc. MD [place] paged and approved giving medication early.

## 2019-10-19 NOTE — Discharge Summary (Signed)
Discharge Summary    Patient ID: AMEDEO DETWEILER MRN: [id number]; DOB: November 23, 1945  Admit date: 10/18/2019 Discharge date: 10/19/2019  Primary Care Provider: [name], MD  Primary Cardiologist: [name], MD Primary Electrophysiologist:  [name], MD   Discharge Diagnoses    Principal Problem:   Exertional angina [facility]) Active Problems:   Hypertension   Hyperlipidemia   CAD (coronary artery disease)   CKD (chronic kidney disease) stage 4, GFR 15-29 ml/min (HCC)   Angina pectoris [facility])  Diagnostic Studies/Procedures    LHC 10/18/19:   1. Severe native vessel coronary artery disease with total occlusion of the LAD and native RCA, continued patency of the stented segment in the proximal circumflex 2. Continued patency of the LIMA to LAD with stable moderate stenosis of 70% at the LIMA anastomotic site unchanged from previous studies 3. Extensive severe disease within the previously stented segment of the saphenous vein graft to PDA as well as severe de novo stenosis just beyond the distal anastomotic site in the PDA, both lesions treated with PCI. Successful stenting of the PDA and successful angioplasty and scoring balloon angioplasty of the saphenous vein graft within the previously implanted stents.  Recommend: Long-term dual antiplatelet therapy with aspirin and clopidogrel. Postprocedural hydration in this patient with significant kidney disease. Repeat metabolic panel tomorrow morning. Discharge tomorrow if no complications arise. _____________   History of Present Illness     Kyle Avila is a 74 y.o. male with a hx of CAD and chronic angina, presenting for follow-up evaluation.The patient has an extensive coronary history with initial bypass surgery in 1988 followed by redo CABG in 1996. He has undergone multiple PCI procedures over the years following his CABG. His last PCI procedure in 2018 involved stenting of the saphenous vein graft to PDA.  He also has carotid disease status post right carotid endarterectomy, complete heart block status post pacemaker, chronic kidney disease, hypertension and hyperlipidemia.The patient called in with increasing angina and is added on for office evaluation 10/18/19 with plans for [facility].   Hospital Course     LHC performed 10/18/19 which showed severe native vessel coronary artery disease with total occlusion of the LAD and native RCA, continued patency of the stented segment in the proximal circumflex. Continued patency of the LIMA to LAD with stable moderate stenosis of 70% at the LIMA anastomotic site unchanged from previous studies. Extensive severe disease within the previously stented segment of the saphenous vein graft to PDA as well as severe de novo stenosis just beyond the distal anastomotic site in the PDA, both lesions treated with PCI. Successful stenting of the PDA and successful angioplasty and scoring balloon angioplasty of the saphenous vein graft within the previously implanted stents.  Recommend: Long-term dual antiplatelet therapy with aspirin and clopidogrel. Postprocedural hydration in this patient with significant kidney disease. Repeat metabolic panel tomorrow morning.   Hospital problems include:   CAD: -LHC 10/18/19 with severe native vessel coronary artery disease with total occlusion of the LAD and native RCA, continued patency of the stented segment in the proximal circumflex. Continued patency of the LIMA to LAD with stable moderate stenosis of 70% at the LIMA anastomotic site unchanged from previous studies. Extensive severe disease within the previously stented segment of the saphenous vein graft to PDA as well as severe de novo stenosis just beyond the distal anastomotic site in the PDA, both lesions treated with PCI. Successful stenting of the PDA and successful angioplasty and scoring balloon angioplasty of the  saphenous vein graft within the previously implanted  stents. -Plan for long-term dual antiplatelet therapy with aspirin and clopidogrel.   CKD stage IV: -Creatinine 1.99 today with a baseline that appears to be in the 2.0 range  HLD: -Currently treated with aggressive medical therapy with goal of <70 -LDL found to be (-2) calculated. Per Dr. Debara Pickett would drop Crestor and continue Repatha  HTN: -Stable, 146/75>140/67>123/91 -Continue current regimen    Consultants: None  The patient was seen and examined by Dr. Ellyn Hack who feels that he is stable and ready for discharge today, 10/19/2019.  The patient has ambulated with cardiac rehabilitation without recurrent chest pain or other issues.  Cath site is stable without signs of hematoma.  Did the patient have an acute coronary syndrome (MI, NSTEMI, STEMI, etc) this admission?:  Yes                               AHA/ACC Clinical Performance & Quality Measures: 1. Aspirin prescribed? - Yes 2. ADP Receptor Inhibitor (Plavix/Clopidogrel, Brilinta/Ticagrelor or Effient/Prasugrel) prescribed (includes medically managed patients)? - Yes 3. Beta Blocker prescribed? - Yes 4. High Intensity Statin (Lipitor 40-80mg  or Crestor 20-40mg ) prescribed? - No - Repatha  5. EF assessed during THIS hospitalization? - Yes 6. For EF <40%, was ACEI/ARB prescribed? - Not Applicable (EF >/= 56%) 7. For EF <40%, Aldosterone Antagonist (Spironolactone or Eplerenone) prescribed? - n/a 8. Cardiac Rehab Phase II ordered (including medically managed patients)? - Yes   _____________  Discharge Vitals Blood pressure (!) 146/75, pulse 61, temperature 97.7 F (36.5 C), temperature source Oral, resp. rate 18, height 5\' 8"  (1.727 m), weight 77.3 kg, SpO2 97 %.  Filed Weights   10/18/19 0546 10/18/19 1425 10/19/19 0213  Weight: 75.8 kg 79.8 kg 77.3 kg    Labs & Radiologic Studies    CBC Recent Labs    10/19/19 0306 10/19/19 1210  WBC 4.4 5.2  HGB 12.8* 13.1  HCT 38.7* 39.8  MCV 106.9* 108.2*  PLT 160 389    Basic Metabolic Panel Recent Labs    10/19/19 0306  NA 139  K 4.5  CL 109  CO2 24  GLUCOSE 104*  BUN 34*  CREATININE 1.99*  CALCIUM 8.5*   Liver Function Tests No results for input(s): AST, ALT, ALKPHOS, BILITOT, PROT, ALBUMIN in the last 72 hours. No results for input(s): LIPASE, AMYLASE in the last 72 hours. High Sensitivity Troponin:   Recent Labs  Lab 10/19/19 0727 10/19/19 0931 10/19/19 1210  TROPONINIHS 77* 66* 66*    BNP Invalid input(s): POCBNP D-Dimer No results for input(s): DDIMER in the last 72 hours. Hemoglobin A1C No results for input(s): HGBA1C in the last 72 hours. Fasting Lipid Panel Recent Labs    10/19/19 0306  CHOL 90  HDL 37*  LDLCALC NEG 2  TRIG 275*  CHOLHDL 2.4   Thyroid Function Tests No results for input(s): TSH, T4TOTAL, T3FREE, THYROIDAB in the last 72 hours.  Invalid input(s): FREET3 _____________  CARDIAC CATHETERIZATION  Result Date: 10/18/2019 1.  Severe native vessel coronary artery disease with total occlusion of the LAD and native RCA, continued patency of the stented segment in the proximal circumflex 2.  Continued patency of the LIMA to LAD with stable moderate stenosis of 70% at the LIMA anastomotic site unchanged from previous studies 3.  Extensive severe disease within the previously stented segment of the saphenous vein graft to PDA as well as  severe de novo stenosis just beyond the distal anastomotic site in the PDA, both lesions treated with PCI.  Successful stenting of the PDA and successful angioplasty and scoring balloon angioplasty of the saphenous vein graft within the previously implanted stents. Recommend: Long-term dual antiplatelet therapy with aspirin and clopidogrel.  Postprocedural hydration in this patient with significant kidney disease.  Repeat metabolic panel tomorrow morning.  Discharge tomorrow if no complications arise.  CUP PACEART REMOTE DEVICE CHECK  Result Date: 09/22/2019 Scheduled remote  reviewed. Normal device function. There were 10 atrial arrhythmias detected, 9 were less than 1 minutes and there was one that was 48.5 minutes.  Sent to triage for AF event Next remote 91 days. Kathy Breach, RN, CCDS, [employer]  Disposition   Pt is being discharged home today in good condition.  Follow-up Plans & Appointments    Follow-up Information    Charlie Pitter, PA-C Follow up on 11/01/2019.   Specialties: Cardiology, Radiology Why: at 3:15pm Contact information: [address] [place] [address] [place] [address] [phone]              Discharge Instructions    Amb Referral to Cardiac Rehabilitation   Complete by: As directed    Diagnosis:  Coronary Stents PTCA     After initial evaluation and assessments completed: Virtual Based Care may be provided alone or in conjunction with Phase 2 Cardiac Rehab based on patient barriers.: Yes   Call MD for:  difficulty breathing, headache or visual disturbances   Complete by: As directed    Call MD for:  extreme fatigue   Complete by: As directed    Call MD for:  hives   Complete by: As directed    Call MD for:  persistant dizziness or light-headedness   Complete by: As directed    Call MD for:  persistant nausea and vomiting   Complete by: As directed    Call MD for:  redness, tenderness, or signs of infection (pain, swelling, redness, odor or green/yellow discharge around incision site)   Complete by: As directed    Call MD for:  severe uncontrolled pain   Complete by: As directed    Call MD for:  temperature >100.4   Complete by: As directed    Diet - low sodium heart healthy   Complete by: As directed    Discharge instructions   Complete by: As directed    PLEASE DO NOT [place]!!!! Also keep a log of you blood pressures and bring back to your follow up appt. Please call the office with any questions.  Patients taking blood thinners should generally stay away from medicines  like ibuprofen, Advil, Motrin, naproxen, and Aleve due to risk of stomach bleeding. You may take Tylenol as directed or talk to your primary doctor about alternatives.  Some studies suggest Prilosec/Omeprazole interacts with Plavix.  Please use Protonix for reflux symptoms.    No driving for 5 days. No lifting over 5 lbs for 1 week. No sexual activity for 1 week. Keep procedure site clean & dry. If you notice increased pain, swelling, bleeding or pus, call/return!  You may shower, but no soaking baths/hot tubs/pools for 1 week.   Increase activity slowly   Complete by: As directed      Discharge Medications   Allergies as of 10/19/2019   No Known Allergies     Medication List    STOP taking these medications  rosuvastatin 20 MG tablet Commonly known as: CRESTOR     TAKE these medications   allopurinol 100 MG tablet Commonly known as: ZYLOPRIM Take 100 mg by mouth daily.   amLODipine 10 MG tablet Commonly known as: NORVASC Take 1 tablet (10 mg total) by mouth daily.   angioplasty book Misc 1 each by Does not apply route once for 1 dose.   aspirin 81 MG EC tablet Take 81 mg by mouth daily.   benzonatate 200 MG capsule Commonly known as: TESSALON Take 200 mg by mouth in the morning and at bedtime.   clopidogrel 75 MG tablet Commonly known as: PLAVIX Take 1 tablet (75 mg total) by mouth daily.   furosemide 40 MG tablet Commonly known as: LASIX Take 1 tablet (40 mg total) by mouth daily.   isosorbide mononitrate 120 MG 24 hr tablet Commonly known as: IMDUR Take 1 tablet (120 mg total) by mouth daily.   ketoconazole 2 % cream Commonly known as: NIZORAL Apply 1 application topically daily as needed for irritation.   losartan 100 MG tablet Commonly known as: COZAAR Take 1 tablet (100 mg total) by mouth 2 (two) times daily.   metoprolol succinate 50 MG 24 hr tablet Commonly known as: TOPROL-XL Take 1 tablet (50 mg total) by mouth 2 (two) times daily. Take with  or immediately following meals.   Nitrostat 0.4 MG SL tablet Generic drug: nitroGLYCERIN PLACE ONE TABLET UNDER THE TONGUE EVERY FIVE MINUTES AS NEEDED FOR CHEST PAIN What changed: See the new instructions.   pantoprazole 40 MG tablet Commonly known as: PROTONIX Take 40 mg by mouth daily.   ranolazine 1000 MG SR tablet Commonly known as: RANEXA Take 1 tablet (1,000 mg total) by mouth 2 (two) times daily.   Repatha 140 MG/ML [place] Generic drug: Evolocumab Inject 140 mg into the skin every 14 (fourteen) days. Notes to patient: Take next dose as scheduled.   tamsulosin 0.4 MG Caps capsule Commonly known as: FLOMAX Take 0.4 mg by mouth 2 (two) times a day.       Outstanding Labs/Studies   BMET   Duration of Discharge Encounter   Greater than 30 minutes including physician time.  Signed, Kathyrn Drown, NP 10/19/2019, 1:29 PM

## 2019-10-19 NOTE — Progress Notes (Signed)
Pt has been ambulating numerous times in hall. Feels well. Reviewed Plavix, restrictions, stent, exercise, NTG and CRPII. Pt receptive. Very informed. Not interested in CRPII this time. Will refer to [place] Yves Dill CES, ACSM 9:52 AM 10/19/2019

## 2019-10-27 ENCOUNTER — Encounter: Payer: Self-pay | Admitting: Physician Assistant

## 2019-10-27 NOTE — Progress Notes (Addendum)
Cardiology Office Note    Date:  11/01/2019   ID:  JOSHUWA VECCHIO, DOB [birth year], MRN [id number]  PCP:  [name], MD  Cardiologist:  [name], MD  Electrophysiologist:  [name], MD   Chief Complaint: f/u PCI  History of Present Illness:   Kyle Avila is a 74 y.o. male with history of CAD s/p CABG 1988 with redo in 1996 with multiple PCIs since then, mild cardioymopathy with EF 45-50%, carotid disease s/p right CEA, CHB s/p PPM, CKD stage IV, HTN, HLD, lupus, NSVT who was recently seen in the office with exertional chest pain felt concerning for angina. He was admitted for cardiac cath as outlined below with subsequent successful stenting of the PDA and successful angioplasty and scoring balloon angioplasty of the saphenous vein graft within the previously implanted stents. It was recommended to continue long term DAPT. Otherwise last echo 08/19/19 showed EF 45-50%, grade 2 DD, severe hypokinesis of the left ventricular, basal-mid inferolateral wall, mildly elevated PASP and mildly enlarged RV, moderate biatrial enlargement, mild MR. His LDL was found to be negative 2 so Crestor was discontinued and he was continued on Repatha. F/u direct LDL was 47.  He is seen back for follow-up today post-cath follow-up. He reports he is disheartened because he's continued to have episodic angina. This feels like pressure across his chest and down his arms. He did well the first day out of the hospital but noticed it on day 2 when he went to the mailbox. However, it does not happen every time he exerts himself and this is frustrating to him. Sometimes he has to stop and rest after only 3 minutes on the treadmill and take a NTG. This relieves his symptoms pretty quickly. There has been mild SOB but not really a salient feature; no nausea, diaphoresis, or presyncope/syncope. At other times, such as today, he is able to walk for 10 minutes on the treadmill without any symptoms whatsoever. He  reports he can lift a dumbbell over his head without provoking symptoms. However, if he tries to use the rota-tiller, this can exacerbate symptoms. This happens primarily with exertion, but a few rare episodes with ADLs like getting out of the shower. No bleeding, edema, orthopnea reported. Compliant with meds. His BP is slightly elevated today but he did not get much sleep last night as he has been under increased stress establishing a trust. He brings in a log of his home BPs which range in the 062B-[id number] 831 systolic range. 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   Labwork independently reviewed: 10/28/19 trig 254, LDL 52, direct LDL 47 09/2019 Hgb 13.1, plt 161, LDL "neg 2", trig 275, HDL 37, K 4.5, Cr 1.99 08/2018 LFTs OK   Past Medical History:  Diagnosis Date  . Arthritis    "some in my back" (04/14/2017)  . CAD (coronary artery disease)    a.  s/p CABG 1988 with redo in 1996 with multiple PCIs since then  . Carotid artery occlusion    s/p RCEA  . CKD (chronic kidney disease), stage IV ([place])   . Complete heart block (HCC)    s/p PTVDP  . Gout   . HTN (hypertension)   . Hyperlipidemia   . Lupus ([place])    "that's why my kidneys are gone; lupus attacked them" (04/14/2017)  . Mitral insufficiency   . Myocardial infarction ([place])    "  I've had some mild one" (04/14/2017)  . Presence of permanent cardiac pacemaker   . Skin cancer    "nose"  . 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 ACHILLES TENDON REPAIR Right   . CARDIAC CATHETERIZATION    . CARDIAC CATHETERIZATION N/A 11/03/2014   Procedure: Left Heart Cath and Cors/Grafts Angiography;  Surgeon: [name], MD;  Location: [facility];  Service: Cardiovascular;  Laterality: N/A;  . CARDIAC CATHETERIZATION N/A 11/03/2014   Procedure: Coronary Stent Intervention;  Surgeon:  [name], MD;  Location: [facility];  Service: Cardiovascular;  Laterality: N/A;  . CAROTID ENDARTERECTOMY  2002   "? side"  . CATARACT EXTRACTION W/ INTRAOCULAR LENS IMPLANT     "? side"  . CORONARY ANGIOPLASTY WITH STENT PLACEMENT  04/14/2017  . CORONARY ANGIOPLASTY WITH STENT PLACEMENT  11/03/2014   SVG PVA  . CORONARY ARTERY BYPASS GRAFT     with redo cabg  . CORONARY STENT INTERVENTION N/A 10/18/2019   Procedure: CORONARY STENT INTERVENTION;  Surgeon: [name], MD;  Location: [facility];  Service: Cardiovascular;  Laterality: N/A;  . CORONARY/GRAFT ANGIOGRAPHY N/A 10/18/2019   Procedure: CORONARY/GRAFT ANGIOGRAPHY;  Surgeon: [name], MD;  Location: [place] [facility];  Service: Cardiovascular;  Laterality: N/A;  . EP IMPLANTABLE DEVICE N/A 09/13/2014   Procedure: PPM Generator Changeout;  Surgeon: Deboraha Sprang, MD;  Location: [place] [facility];  Service: Cardiovascular;  Laterality: N/A;  . LEFT HEART CATH AND CORS/GRAFTS ANGIOGRAPHY N/A 04/14/2017   Procedure: LEFT HEART CATH AND CORS/GRAFTS ANGIOGRAPHY;  Surgeon: [name], MD;  Location: [place] [facility];  Service: Cardiovascular;  Laterality: N/A;  . MOHS SURGERY     "just outside my nose"  . PACEMAKER INSERTION  2007  . TONSILLECTOMY  1958    Current Medications: Current Meds  Medication Sig  . allopurinol (ZYLOPRIM) 100 MG tablet Take 100 mg by mouth daily.   Marland Kitchen amLODipine (NORVASC) 10 MG tablet Take 1 tablet (10 mg total) by mouth daily.  Marland Kitchen aspirin EC 81 MG EC tablet Take 81 mg by mouth daily.   . benzonatate (TESSALON) 200 MG capsule Take 200 mg by mouth in the morning and at bedtime.  . clopidogrel (PLAVIX) 75 MG tablet Take 1 tablet (75 mg total) by mouth daily.  . Evolocumab (REPATHA) 140 MG/ML SOSY Inject 140 mg into the skin every 14 (fourteen) days.   . fluticasone (FLONASE) 50 MCG/ACT nasal spray Place 2 sprays into both nostrils in the morning and at bedtime.  .  furosemide (LASIX) 40 MG tablet Take 1 tablet (40 mg total) by mouth daily.  . isosorbide mononitrate (IMDUR) 120 MG 24 hr tablet Take 1 tablet (120 mg total) by mouth daily.  Marland Kitchen ketoconazole (NIZORAL) 2 % cream Apply 1 application topically daily as needed for irritation.  Marland Kitchen losartan (COZAAR) 100 MG tablet Take 1 tablet (100 mg total) by mouth 2 (two) times daily.  . metoprolol succinate (TOPROL-XL) 50 MG 24 hr tablet Take 1 tablet (50 mg total) by mouth 2 (two) times daily. Take with or immediately following meals.  Marland Kitchen NITROSTAT 0.4 MG SL tablet PLACE ONE TABLET UNDER THE TONGUE EVERY FIVE MINUTES AS NEEDED FOR CHEST PAIN  . pantoprazole (PROTONIX) 40 MG tablet Take 40 mg by mouth daily.  . ranolazine (RANEXA) 1000 MG SR tablet Take 1 tablet (1,000 mg total) by mouth 2 (two) times daily.  Marland Kitchen  tamsulosin (FLOMAX) 0.4 MG CAPS Take 0.4 mg by mouth 2 (two) times a day.       Allergies:   Patient has no known allergies.   Social History   Socioeconomic History  . Marital status: Married    Spouse name: Not on file  . Number of children: Not on file  . Years of education: Not on file  . Highest education level: Not on file  Occupational History  . Not on file  Tobacco Use  . Smoking status: Never Smoker  . Smokeless tobacco: Never Used  Vaping Use  . Vaping Use: Never used  Substance and Sexual Activity  . Alcohol use: Yes    Alcohol/week: 3.0 standard drinks    Types: 1 Glasses of wine, 1 Cans of beer, 1 Standard drinks or equivalent per week    Comment: \  . Drug use: No  . Sexual activity: Not Currently  Other Topics Concern  . Not on file  Social History Narrative  . Not on file   Social Determinants of Health   Financial Resource Strain:   . Difficulty of Paying Living Expenses:   Food Insecurity:   . Worried About Charity fundraiser in the Last Year:   . Arboriculturist in the Last Year:   Transportation Needs:   . Film/video editor (Medical):   Marland Kitchen Lack of  Transportation (Non-Medical):   Physical Activity:   . Days of Exercise per Week:   . Minutes of Exercise per Session:   Stress:   . Feeling of Stress :   Social Connections:   . Frequency of Communication with Friends and Family:   . Frequency of Social Gatherings with Friends and Family:   . Attends Religious Services:   . Active Member of Clubs or Organizations:   . Attends Archivist Meetings:   Marland Kitchen Marital Status:      Family History:  [name] family history includes Cancer in his mother; Heart attack in his father; Heart disease in his father.  ROS:   Please see the history of present illness.  All other systems are reviewed and otherwise negative.    EKGs/Labs/Other Studies Reviewed:    Studies reviewed are outlined and summarized above. Reports included below if pertinent.  Last Duplex 5170 <01% LICA, patent RICA  LHC 10/18/19 1.  Severe native vessel coronary artery disease with total occlusion of the LAD and native RCA, continued patency of the stented segment in the proximal circumflex 2.  Continued patency of the LIMA to LAD with stable moderate stenosis of 70% at the LIMA anastomotic site unchanged from previous studies 3.  Extensive severe disease within the previously stented segment of the saphenous vein graft to PDA as well as severe de novo stenosis just beyond the distal anastomotic site in the PDA, both lesions treated with PCI.  Successful stenting of the PDA and successful angioplasty and scoring balloon angioplasty of the saphenous vein graft within the previously implanted stents.  Recommend: Long-term dual antiplatelet therapy with aspirin and clopidogrel.  Postprocedural hydration in this patient with significant kidney disease.  Repeat metabolic panel tomorrow morning.  Discharge tomorrow if no complications arise.  2D Echo 08/19/19 1. Left ventricular ejection fraction, by estimation, is 45 to 50%. The  left ventricle has mildly  decreased function. The left ventricle has no  regional wall motion abnormalities. Left ventricular diastolic parameters  are consistent with Grade II  diastolic dysfunction (pseudonormalization). Elevated left atrial  pressure. There is severe hypokinesis of the left ventricular, basal-mid  inferolateral wall.  2. Right ventricular systolic function is normal. The right ventricular  size is mildly enlarged. There is mildly elevated pulmonary artery  systolic pressure. The estimated right ventricular systolic pressure is  54.2 mmHg.  3. Left atrial size was moderately dilated.  4. Right atrial size was moderately dilated.  5. The mitral valve is normal in structure. Mild mitral valve  regurgitation. No evidence of mitral stenosis.  6. The aortic valve is normal in structure. Aortic valve regurgitation is  trivial. No aortic stenosis is present.  7. The inferior vena cava is normal in size with greater than 50%  respiratory variability, suggesting right atrial pressure of 3 mmHg.   Comparison(s): Prior images reviewed side by side. The left ventricular  function is unchanged. The left ventricular wall motion abnormality is  unchanged. There are more prominent signs of increased mean left at     EKG:  EKG is ordered today, personally reviewed, demonstrating AV paced rhthm with QRS duration 115ms, nonspecific STTW changes similar to prior  Recent Labs: 10/19/2019: BUN 34; Creatinine, Ser 1.99; Hemoglobin 13.1; Platelets 161; Potassium 4.5; Sodium 139  Recent Lipid Panel    Component Value Date/Time   CHOL 133 10/28/2019 0820   TRIG 254 (H) 10/28/2019 0820   HDL 41 10/28/2019 0820   CHOLHDL 3.2 10/28/2019 0820   CHOLHDL 2.4 10/19/2019 0306   VLDL 55 (H) 10/19/2019 0306   LDLCALC 52 10/28/2019 0820   LDLDIRECT 47 10/28/2019 0820    PHYSICAL EXAM:    VS:  BP (!) 150/76   Pulse 60   Ht 5\' 8"  (1.727 m) Comment: per pt  Wt 172 lb 9.6 oz (78.3 kg)   SpO2 97%   BMI 26.24  kg/m   BMI: Body mass index is 26.24 kg/m.  GEN: Well nourished, well developed WM, in no acute distress HEENT: normocephalic, atraumatic Neck: no JVD, carotid bruits, or masses Cardiac: RRR; no murmurs, rubs, or gallops, no edema  Respiratory:  clear to auscultation bilaterally, normal work of breathing GI: soft, nontender, nondistended, + BS MS: no deformity or atrophy Skin: warm and dry, no rash, right groin cath site without hematoma, ecchymosis, or bruit. Neuro:  Alert and Oriented x 3, Strength and sensation are intact, follows commands Psych: euthymic mood, full affect  Wt Readings from Last 3 Encounters:  11/01/19 172 lb 9.6 oz (78.3 kg)  10/19/19 170 lb 6.4 oz (77.3 kg)  10/14/19 171 lb 9.6 oz (77.8 kg)     ASSESSMENT & PLAN:   1. CAD s/p CABG/redo/recent PCI with continued angina - case discussed with Dr. Johnsie Cancel. Chest pain sounds anginal in nature although is variable in when it gets provoked. Per discussion with Dr. Johnsie Cancel, will have him increase isosorbide to 120mg  BID - he plans to take 2nd dose around lunchtime as majority of angina happens closer towards the evening. He is not tachycardic, tachypneic or hypoxic and has not had any symptoms today. Will update CBC and TSH with labs today (along with f/u BMET as below). Will arrange close f/u with Dr. Burt Knack next week. If symptoms persist, may need relook heart cath. Warning sx/ER precautions reviewed with patient. Continue amlodipine, Ranexa (both maxed out) and beta blocker. I will also reach out to Dr. Caryl Comes to inquire whether there is anything that can improve his symptoms given his history of pacemaker, with prior h/o chronotropic incompetence noted. 2. Mild cardiomyopathy - no symptoms  to suggest overt heart failure. Euvolemic on exam. Continue BB and ARB. Can consider titration of BB in the future. 3. Hyperlipidemia - on PCSK9i. Statin recently stopped due to LDL -2. 4. CKD stage IV - recheck BMET today to ensure  post-cath stability. He is on unusual dose of losartan, but per [facility] notes this was prescribed this way by nephrology but Dr. Burt Knack has since been refilling. Assess Cr and likely plan to touch base with nephrology. If losartan is decreased in the future, may need additional antihypertensive control such as a switch to carvedilol or initiation of hydralazine. 5. Carotid artery disease s/p R CEA - we discussed repeat duplex but he has wished to defer due to cost. He will let us know when he is ready to revisit.  Disposition: F/u withDr. Burt Knack in 1 week.  Medication Adjustments/Labs and Tests Ordered: Current medicines are reviewed at length with the patient today.  Concerns regarding medicines are outlined above. Medication changes, Labs and Tests ordered today are summarized above and listed in the Patient Instructions accessible in Encounters.   Signed, Charlie Pitter, PA-C  11/01/2019 4:26 PM    [facility] [place] [place], [place], [place]  [address] Phone: [phone]; Fax: [phone]

## 2019-10-28 ENCOUNTER — Other Ambulatory Visit: Payer: Medicare HMO | Admitting: *Deleted

## 2019-10-28 ENCOUNTER — Other Ambulatory Visit: Payer: Self-pay

## 2019-10-28 DIAGNOSIS — E782 Mixed hyperlipidemia: Secondary | ICD-10-CM | POA: Diagnosis not present

## 2019-10-28 LAB — LIPID PANEL
Chol/HDL Ratio: 3.2 ratio (ref 0.0–5.0)
Cholesterol, Total: 133 mg/dL (ref 100–199)
HDL: 41 mg/dL (ref >39)
LDL Chol Calc (NIH): 52 mg/dL (ref 0–99)
Triglycerides: 254 mg/dL — ABNORMAL HIGH (ref 0–149)
VLDL Cholesterol Cal: 40 mg/dL (ref 5–40)

## 2019-10-28 LAB — LDL CHOLESTEROL, DIRECT: LDL Direct: 47 mg/dL (ref 0–99)

## 2019-11-01 ENCOUNTER — Encounter: Payer: Self-pay | Admitting: Physician Assistant

## 2019-11-01 ENCOUNTER — Ambulatory Visit: Payer: Medicare HMO | Admitting: Physician Assistant

## 2019-11-01 ENCOUNTER — Telehealth: Payer: Self-pay | Admitting: Physician Assistant

## 2019-11-01 ENCOUNTER — Other Ambulatory Visit: Payer: Self-pay

## 2019-11-01 VITALS — BP 150/76 | HR 60 | Ht 68.0 in | Wt 172.6 lb

## 2019-11-01 DIAGNOSIS — N184 Chronic kidney disease, stage 4 (severe): Secondary | ICD-10-CM

## 2019-11-01 DIAGNOSIS — E782 Mixed hyperlipidemia: Secondary | ICD-10-CM | POA: Diagnosis not present

## 2019-11-01 DIAGNOSIS — I429 Cardiomyopathy, unspecified: Secondary | ICD-10-CM | POA: Diagnosis not present

## 2019-11-01 DIAGNOSIS — I25708 Atherosclerosis of coronary artery bypass graft(s), unspecified, with other forms of angina pectoris: Secondary | ICD-10-CM | POA: Diagnosis not present

## 2019-11-01 DIAGNOSIS — I6521 Occlusion and stenosis of right carotid artery: Secondary | ICD-10-CM | POA: Diagnosis not present

## 2019-11-01 NOTE — Patient Instructions (Addendum)
Medication Instructions:  Your physician recommends that you continue on your current medications as directed. Please refer to the Current Medication list given to you today.  *If you need a refill on your cardiac medications before your next appointment, please call your pharmacy*   Lab Work: TODAY:  BMET, CBC, & TSH  If you have labs (blood work) drawn today and your tests are completely normal, you will receive your results only by: Marland Kitchen MyChart Message (if you have MyChart) OR . A paper copy in the mail If you have any lab test that is abnormal or we need to change your treatment, we will call you to review the results.   Testing/Procedures: None ordered   Follow-Up: At [facility], you and your health needs are our priority.  As part of our continuing mission to provide you with exceptional heart care, we have created designated Provider Care Teams.  These Care Teams include your primary Cardiologist (physician) and Advanced Practice Providers (APPs -  Physician Assistants and Nurse Practitioners) who all work together to provide you with the care you need, when you need it.  We recommend signing up for the patient portal called "MyChart".  Sign up information is provided on this After Visit Summary.  MyChart is used to connect with patients for Virtual Visits (Telemedicine).  Patients are able to view lab/test results, encounter notes, upcoming appointments, etc.  Non-urgent messages can be sent to your provider as well.   To learn more about what you can do with MyChart, go to [email or web address].    Your next appointment:   11/10/2019 ARRIVE AT 10:45 TO SEE Dr. Burt Knack  The format for your next appointment:   In Person  Provider:   You may see [name], MD or one of the following Advanced Practice Providers on your designated Care Team:    Richardson Dopp, PA-C  Robbie Lis, [place]    Other Instructions

## 2019-11-01 NOTE — Telephone Encounter (Signed)
   Labs still pending so can likely make one call to pt - let him know I spoke with Dr. Caryl Comes about him, to determine if there is anything that can be optimized on his pacemaker to help with his angina. He feels there might be some adjustments they can make and recommends to get him into device clinic on a day when he is in clinic to assist. Will route to [place] to let pt know, and [place] to assist with the EP scheduling part. Corbitt Cloke PA-C

## 2019-11-02 ENCOUNTER — Telehealth: Payer: Self-pay | Admitting: Physician Assistant

## 2019-11-02 DIAGNOSIS — Z79899 Other long term (current) drug therapy: Secondary | ICD-10-CM

## 2019-11-02 LAB — CBC
Hematocrit: 38.3 % (ref 37.5–51.0)
Hemoglobin: 12.6 g/dL — ABNORMAL LOW (ref 13.0–17.7)
MCH: 35.4 pg — ABNORMAL HIGH (ref 26.6–33.0)
MCHC: 32.9 g/dL (ref 31.5–35.7)
MCV: 108 fL — ABNORMAL HIGH (ref 79–97)
Platelets: 165 10*3/uL (ref 150–450)
RBC: 3.56 x10E6/uL — ABNORMAL LOW (ref 4.14–5.80)
RDW: 12.4 % (ref 11.6–15.4)
WBC: 5.2 10*3/uL (ref 3.4–10.8)

## 2019-11-02 LAB — BASIC METABOLIC PANEL
BUN/Creatinine Ratio: 22 (ref 10–24)
BUN: 47 mg/dL — ABNORMAL HIGH (ref 8–27)
CO2: 25 mmol/L (ref 20–29)
Calcium: 9.5 mg/dL (ref 8.6–10.2)
Chloride: 100 mmol/L (ref 96–106)
Creatinine, Ser: 2.09 mg/dL — ABNORMAL HIGH (ref 0.76–1.27)
GFR calc Af Amer: 35 mL/min/{1.73_m2} — ABNORMAL LOW (ref >59)
GFR calc non Af Amer: 30 mL/min/{1.73_m2} — ABNORMAL LOW (ref >59)
Glucose: 100 mg/dL — ABNORMAL HIGH (ref 65–99)
Potassium: 5.2 mmol/L (ref 3.5–5.2)
Sodium: 139 mmol/L (ref 134–144)

## 2019-11-02 LAB — TSH: TSH: 5.72 u[IU]/mL — ABNORMAL HIGH (ref 0.450–4.500)

## 2019-11-02 NOTE — Telephone Encounter (Signed)
Deanna from [facility] is returning [employer] call. Please advise.

## 2019-11-02 NOTE — Telephone Encounter (Signed)
Returned call to [employer] at [facility].  She looked back in the pt's chart and confirmed that per her records, pt has been on Losartan 100 bid every since 2015 but they hadn't filled it since 2018.  She will check with Dr. Arty Baumgartner to see if he feels that the dose can be changed and call me back.

## 2019-11-04 NOTE — Telephone Encounter (Signed)
-----   Message from Charlie Pitter, [place] sent at 11/02/2019  8:22 AM EDT ----- Please let pt know labs are generally stable with a few abnormalities - kidney function remains abnormal but Cr is very similar to pre-cath values - his potassium level is upper limits of normal - I do note he is on an unusual dose of losartan 100mg  BID - the max dose is usually 100mg  daily. I saw a prior note by Nicki Reaper saying it was thought that nephrology had prescribed it this way. Can you call nephrology (Dr Arty Baumgartner) to inquire about whether this is the case? Please let them know pt's potassium has been trending in the high-normal range recently, and appreciate their input. Please let me know what they say. If they feel that the losartan should be reduced, we may need to further adjust meds to prevent a swing towards high BP - Also need to f/u with primary care for mildly elevated TSH (possible hypothyroidism) and continued mild anemia - this looks stable  Also see phone note re: PPM.. Dayna Dunn PA-C

## 2019-11-04 NOTE — Telephone Encounter (Signed)
Pt has been made aware of his lab result. See result note. Pt aware we did speak with Dr. Caryl Comes re: ppm and to keep his appt.

## 2019-11-04 NOTE — Telephone Encounter (Signed)
Placed a follow-up call to [facility] @ [facility].  Left her a message to return my call re: pt's Losartan 100 bid.

## 2019-11-07 MED ORDER — LOSARTAN POTASSIUM 100 MG PO TABS
100.0000 mg | ORAL_TABLET | Freq: Every day | ORAL | 3 refills | Status: DC
Start: 2019-11-07 — End: 2019-12-19

## 2019-11-07 NOTE — Telephone Encounter (Signed)
Follow up ° ° °Patient is returning your call. Please call. ° ° ° °

## 2019-11-07 NOTE — Telephone Encounter (Signed)
Per Deanna at [facility], she was waiting on Dr. Arty Baumgartner to give her a answer. He said it is ok to reduce pt's Losartan down to 100 mg daily but he would like for pt to have a renal function panel in a week. I advised I would send this message to Melina Copa, PA-C to make her aware.   Please advise!

## 2019-11-07 NOTE — Telephone Encounter (Signed)
Sounds good to me. Please let pt know to reduce losartan as requested. We can arrange f/u BMET through our office if they want, whatever is easiest for the patient.  Would like to know what Mr. Hegeman blood pressure is running, since it was slightly elevated in the office recently. If persistently >158 systolic would change his metoprolol to carvedilol 12.5mg  BID - this is cousin of metoprolol but may improve BP moreso than metoprolol usually does. Keep f/u with Dr. Burt Knack and device clinic as planned. Going forward after the losartan change he also should periodically monitor his BP at home and letting us/Dr. Burt Knack know if running >130 on the top number, at which time we can adjust meds further if needed.  Jilleen Essner PA-C

## 2019-11-07 NOTE — Telephone Encounter (Signed)
Call placed to pt re: message below.  Left a message for pt to call back.  

## 2019-11-07 NOTE — Telephone Encounter (Signed)
Returned call to pt. He has been made aware to reduce there Losartan to 100 mg qd and will come in for [facility] 7/20.  Pt didn't have a current log of his bp readings with him and states that he will send via mychart later.

## 2019-11-09 ENCOUNTER — Telehealth: Payer: Self-pay | Admitting: Physician Assistant

## 2019-11-09 NOTE — Telephone Encounter (Signed)
Left message to call office

## 2019-11-09 NOTE — Telephone Encounter (Signed)
Patient called early hours for elevated blood pressure of 175/70. Called twice but went straight to voice mail. Please reach out to the patient during regular hours.

## 2019-11-09 NOTE — Telephone Encounter (Signed)
See MyChart messages from today.

## 2019-11-09 NOTE — Telephone Encounter (Signed)
Spoke with pt who states his B/P continues to increase with reading this am of 175/80, 2 hours after medications. (B/P reading sent through [place] last week)  Pt is also concerned because of his renal disease and daily anginal pain.  Pt states he took a nitro last night that resolved the pain.  Pt denies current active CP and has 11/10/2019 appointment with Dr Burt Knack for f/u.

## 2019-11-09 NOTE — Telephone Encounter (Signed)
Patient is returning call.  °

## 2019-11-10 ENCOUNTER — Ambulatory Visit: Payer: Medicare HMO | Admitting: Cardiovascular Disease

## 2019-11-10 ENCOUNTER — Encounter: Payer: Self-pay | Admitting: Cardiovascular Disease

## 2019-11-10 ENCOUNTER — Other Ambulatory Visit: Payer: Self-pay

## 2019-11-10 VITALS — BP 110/62 | HR 64 | Ht 68.0 in | Wt 169.8 lb

## 2019-11-10 DIAGNOSIS — I25708 Atherosclerosis of coronary artery bypass graft(s), unspecified, with other forms of angina pectoris: Secondary | ICD-10-CM | POA: Diagnosis not present

## 2019-11-10 DIAGNOSIS — H43811 Vitreous degeneration, right eye: Secondary | ICD-10-CM | POA: Diagnosis not present

## 2019-11-10 DIAGNOSIS — H31013 Macula scars of posterior pole (postinflammatory) (post-traumatic), bilateral: Secondary | ICD-10-CM | POA: Diagnosis not present

## 2019-11-10 DIAGNOSIS — H52202 Unspecified astigmatism, left eye: Secondary | ICD-10-CM | POA: Diagnosis not present

## 2019-11-10 DIAGNOSIS — H31003 Unspecified chorioretinal scars, bilateral: Secondary | ICD-10-CM | POA: Diagnosis not present

## 2019-11-10 NOTE — Patient Instructions (Signed)
Medication Instructions:  Your provider recommends that you continue on your current medications as directed. Please refer to the Current Medication list given to you today.    Follow-Up: You are scheduled for an appointment with Richardson Dopp on 8/25 at 9:15AM.

## 2019-11-10 NOTE — Progress Notes (Signed)
Cardiology Office Note:    Date:  11/10/2019   ID:  Kyle Avila, DOB Jan 22, 1946, MRN [id number]  PCP:  [name], MD  [facility] Cardiologist:  [name], MD  [facility] Electrophysiologist:  [name], MD   Referring MD: [name], MD   Chief Complaint  Patient presents with  . Coronary Artery Disease    History of Present Illness:    Kyle Avila is a 74 y.o. male with a hx of coronary artery disease and chronic angina, presenting for follow-up evaluation after undergoing PCI. The patient has an extensive coronary history with initial bypass surgery in 1988 followed by redo CABG in 1996. He has undergone multiple PCI procedures over the years following his CABG. He also has carotid disease status post right carotid endarterectomy, complete heart block status post pacemaker, chronic kidney disease, hypertension and hyperlipidemia.  He is here alone today. After undergoing recent PCI, he hadn't noticed significant improvement in his angina. He also noted that his BP had been running quite high. After several calls and communications, he realized that he hadn't been taking amlodipine. He started back on amlodipine 10 mg yesterday and has felt really well since then. His BP has come down and he was able to walk on the treadmill and do some push-ups without angina.  He feels much better and denies chest discomfort over the last 24 hours.  His breathing is improving.  He has had minimal leg swelling.  No orthopnea or PND.  Past Medical History:  Diagnosis Date  . Arthritis    "some in my back" (04/14/2017)  . CAD (coronary artery disease)    a.  s/p CABG 1988 with redo in 1996 with multiple PCIs since then  . Carotid artery occlusion    s/p RCEA  . CKD (chronic kidney disease), stage IV ([place])   . Complete heart block (HCC)    s/p PTVDP  . Gout   . HTN (hypertension)   . Hyperlipidemia   . Lupus ([place])    "that's why my kidneys are gone; lupus  attacked them" (04/14/2017)  . Mitral insufficiency   . Myocardial infarction ([place])    "I've had some mild one" (04/14/2017)  . Presence of permanent cardiac pacemaker   . Skin cancer    "nose"  . 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 ACHILLES TENDON REPAIR Right   . CARDIAC CATHETERIZATION    . CARDIAC CATHETERIZATION N/A 11/03/2014   Procedure: Left Heart Cath and Cors/Grafts Angiography;  Surgeon: [name], MD;  Location: [place] [facility];  Service: Cardiovascular;  Laterality: N/A;  . CARDIAC CATHETERIZATION N/A 11/03/2014   Procedure: Coronary Stent Intervention;  Surgeon: [name], MD;  Location: [place] [facility];  Service: Cardiovascular;  Laterality: N/A;  . CAROTID ENDARTERECTOMY  2002   "? side"  . CATARACT EXTRACTION W/ INTRAOCULAR LENS IMPLANT     "? side"  . CORONARY ANGIOPLASTY WITH STENT PLACEMENT  04/14/2017  . CORONARY ANGIOPLASTY WITH STENT PLACEMENT  11/03/2014   SVG PVA  . CORONARY ARTERY BYPASS GRAFT     with redo cabg  . CORONARY STENT INTERVENTION N/A 10/18/2019   Procedure: CORONARY STENT INTERVENTION;  Surgeon: [name], MD;  Location: [place] [facility];  Service: Cardiovascular;  Laterality: N/A;  . CORONARY/GRAFT ANGIOGRAPHY N/A 10/18/2019   Procedure: CORONARY/GRAFT ANGIOGRAPHY;  Surgeon: [name], MD;  Location: [place] [facility];  Service: Cardiovascular;  Laterality: N/A;  . EP IMPLANTABLE DEVICE N/A 09/13/2014   Procedure: PPM Generator Changeout;  Surgeon: Deboraha Sprang, MD;  Location: [place] [facility];  Service: Cardiovascular;  Laterality: N/A;  . LEFT HEART CATH AND CORS/GRAFTS ANGIOGRAPHY N/A 04/14/2017   Procedure: LEFT HEART CATH AND CORS/GRAFTS ANGIOGRAPHY;  Surgeon: [name], MD;  Location: [place] [facility];  Service: Cardiovascular;  Laterality: N/A;  . MOHS SURGERY     "just outside my nose"  . PACEMAKER INSERTION  2007  .  TONSILLECTOMY  1958    Current Medications: Current Meds  Medication Sig  . allopurinol (ZYLOPRIM) 100 MG tablet Take 100 mg by mouth daily.   Marland Kitchen amLODipine (NORVASC) 10 MG tablet Take 1 tablet (10 mg total) by mouth daily.  Marland Kitchen aspirin EC 81 MG EC tablet Take 81 mg by mouth daily.   . clopidogrel (PLAVIX) 75 MG tablet Take 1 tablet (75 mg total) by mouth daily.  . Evolocumab (REPATHA) 140 MG/ML SOSY Inject 140 mg into the skin every 14 (fourteen) days.   . fluticasone (FLONASE) 50 MCG/ACT nasal spray Place 2 sprays into both nostrils in the morning and at bedtime.  . furosemide (LASIX) 40 MG tablet Take 1 tablet (40 mg total) by mouth daily.  . isosorbide mononitrate (IMDUR) 120 MG 24 hr tablet Take 1 tablet (120 mg total) by mouth daily.  Marland Kitchen ketoconazole (NIZORAL) 2 % cream Apply 1 application topically daily as needed for irritation.  Marland Kitchen losartan (COZAAR) 100 MG tablet Take 1 tablet (100 mg total) by mouth daily.  . metoprolol succinate (TOPROL-XL) 50 MG 24 hr tablet Take 1 tablet (50 mg total) by mouth 2 (two) times daily. Take with or immediately following meals.  Marland Kitchen NITROSTAT 0.4 MG SL tablet PLACE ONE TABLET UNDER THE TONGUE EVERY FIVE MINUTES AS NEEDED FOR CHEST PAIN  . ranolazine (RANEXA) 1000 MG SR tablet Take 1 tablet (1,000 mg total) by mouth 2 (two) times daily.  . tamsulosin (FLOMAX) 0.4 MG CAPS Take 0.4 mg by mouth 2 (two) times a day.      Allergies:   Patient has no known allergies.   Social History   Socioeconomic History  . Marital status: Married    Spouse name: Not on file  . Number of children: Not on file  . Years of education: Not on file  . Highest education level: Not on file  Occupational History  . Not on file  Tobacco Use  . Smoking status: Never Smoker  . Smokeless tobacco: Never Used  Vaping Use  . Vaping Use: Never used  Substance and Sexual Activity  . Alcohol use: Yes    Alcohol/week: 3.0 standard drinks    Types: 1 Glasses of wine, 1 Cans of  beer, 1 Standard drinks or equivalent per week    Comment: \  . Drug use: No  . Sexual activity: Not Currently  Other Topics Concern  . Not on file  Social History Narrative  . Not on file   Social Determinants of Health   Financial Resource Strain:   . Difficulty of Paying Living Expenses:   Food Insecurity:   . Worried About Charity fundraiser in the Last Year:   . Arboriculturist in the Last Year:   Transportation Needs:   . Film/video editor (Medical):   Marland Kitchen Lack of Transportation (Non-Medical):   Physical Activity:   . Days of Exercise per Week:   . Minutes of Exercise per  Session:   Stress:   . Feeling of Stress :   Social Connections:   . Frequency of Communication with Friends and Family:   . Frequency of Social Gatherings with Friends and Family:   . Attends Religious Services:   . Active Member of Clubs or Organizations:   . Attends Archivist Meetings:   Marland Kitchen Marital Status:      Family History: [name] family history includes Cancer in his mother; Heart attack in his father; Heart disease in his father.  ROS:   Please see the history of present illness.    All other systems reviewed and are negative.  EKGs/Labs/Other Studies Reviewed:    The following studies were reviewed today: Cardiac Cath 10-18-2019: Conclusion  1.  Severe native vessel coronary artery disease with total occlusion of the LAD and native RCA, continued patency of the stented segment in the proximal circumflex 2.  Continued patency of the LIMA to LAD with stable moderate stenosis of 70% at the LIMA anastomotic site unchanged from previous studies 3.  Extensive severe disease within the previously stented segment of the saphenous vein graft to PDA as well as severe de novo stenosis just beyond the distal anastomotic site in the PDA, both lesions treated with PCI.  Successful stenting of the PDA and successful angioplasty and scoring balloon angioplasty of the saphenous vein  graft within the previously implanted stents.  Recommend: Long-term dual antiplatelet therapy with aspirin and clopidogrel.  Postprocedural hydration in this patient with significant kidney disease.  Repeat metabolic panel tomorrow morning.  Discharge tomorrow if no complications arise.  Coronary Diagrams  Diagnostic Dominance: Right  Intervention    EKG:  EKG is ordered today.  The ekg ordered today demonstrates AV sequential pacing 64 bpm  Recent Labs: 11/01/2019: BUN 47; Creatinine, Ser 2.09; Hemoglobin 12.6; Platelets 165; Potassium 5.2; Sodium 139; TSH 5.720  Recent Lipid Panel    Component Value Date/Time   CHOL 133 10/28/2019 0820   TRIG 254 (H) 10/28/2019 0820   HDL 41 10/28/2019 0820   CHOLHDL 3.2 10/28/2019 0820   CHOLHDL 2.4 10/19/2019 0306   VLDL 55 (H) 10/19/2019 0306   LDLCALC 52 10/28/2019 0820   LDLDIRECT 47 10/28/2019 0820    Physical Exam:    VS:  BP 110/62   Pulse 64   Ht 5\' 8"  (1.727 m)   Wt 169 lb 12.8 oz (77 kg)   SpO2 95%   BMI 25.82 kg/m     Wt Readings from Last 3 Encounters:  11/10/19 169 lb 12.8 oz (77 kg)  11/01/19 172 lb 9.6 oz (78.3 kg)  10/19/19 170 lb 6.4 oz (77.3 kg)     GEN:  Well nourished, well developed in no acute distress HEENT: Normal NECK: No JVD; No carotid bruits LYMPHATICS: No lymphadenopathy CARDIAC: RRR, no murmurs, rubs, gallops RESPIRATORY:  Clear to auscultation without rales, wheezing or rhonchi  ABDOMEN: Soft, non-tender, non-distended MUSCULOSKELETAL:  No edema; No deformity  SKIN: Warm and dry NEUROLOGIC:  Alert and oriented x 3 PSYCHIATRIC:  Normal affect   ASSESSMENT:    1. Coronary artery disease involving coronary bypass graft of native heart with other forms of angina pectoris ([place])    PLAN:    In order of problems listed above:  1. The patient has had CCS class III angina, now improved with reinitiation of his full medical program.  He seems to be doing much better with adding back amlodipine  10 mg daily.  He  was able to do some work in his garden and walk back and forth from his mailbox yesterday and this morning with no symptoms.  He brings in home blood pressure readings and I have reviewed these today as part of his evaluation.  Blood pressure has trended down significantly since he has restarted amlodipine.  I reviewed his medical program with him today and no changes are recommended.  I will arrange close outpatient follow-up with Richardson Dopp in about 6 to 8 weeks since he has had frequent and recurrent symptoms of angina.  If he develops recurrent symptoms, he will add back 60 mg of isosorbide in the afternoon, but as long as he remains stable he will stay on isosorbide 120 mg once daily.   Medication Adjustments/Labs and Tests Ordered: Current medicines are reviewed at length with the patient today.  Concerns regarding medicines are outlined above.  Orders Placed This Encounter  Procedures  . EKG 12-Lead   No orders of the defined types were placed in this encounter.   Patient Instructions  Medication Instructions:  Your provider recommends that you continue on your current medications as directed. Please refer to the Current Medication list given to you today.    Follow-Up: You are scheduled for an appointment with Richardson Dopp on 8/25 at 9:15AM.    Signed, [name], MD  11/10/2019 1:43 PM    [place]

## 2019-11-10 NOTE — Telephone Encounter (Signed)
Agree with keeping current meds until seen today! Thanks.

## 2019-11-15 ENCOUNTER — Other Ambulatory Visit: Payer: Medicare HMO

## 2019-11-15 ENCOUNTER — Other Ambulatory Visit: Payer: Self-pay

## 2019-11-15 ENCOUNTER — Ambulatory Visit (INDEPENDENT_AMBULATORY_CARE_PROVIDER_SITE_OTHER): Payer: Medicare HMO | Admitting: Emergency Medicine

## 2019-11-15 DIAGNOSIS — Z79899 Other long term (current) drug therapy: Secondary | ICD-10-CM | POA: Diagnosis not present

## 2019-11-15 DIAGNOSIS — I472 Ventricular tachycardia, unspecified: Secondary | ICD-10-CM

## 2019-11-15 DIAGNOSIS — Z950 Presence of cardiac pacemaker: Secondary | ICD-10-CM

## 2019-11-15 DIAGNOSIS — I4430 Unspecified atrioventricular block: Secondary | ICD-10-CM

## 2019-11-16 LAB — BASIC METABOLIC PANEL
BUN/Creatinine Ratio: 18 (ref 10–24)
BUN: 48 mg/dL — ABNORMAL HIGH (ref 8–27)
CO2: 25 mmol/L (ref 20–29)
Calcium: 9 mg/dL (ref 8.6–10.2)
Chloride: 100 mmol/L (ref 96–106)
Creatinine, Ser: 2.67 mg/dL — ABNORMAL HIGH (ref 0.76–1.27)
GFR calc Af Amer: 26 mL/min/{1.73_m2} — ABNORMAL LOW (ref >59)
GFR calc non Af Amer: 23 mL/min/{1.73_m2} — ABNORMAL LOW (ref >59)
Glucose: 106 mg/dL — ABNORMAL HIGH (ref 65–99)
Potassium: 5.1 mmol/L (ref 3.5–5.2)
Sodium: 139 mmol/L (ref 134–144)

## 2019-11-18 ENCOUNTER — Telehealth: Payer: Self-pay | Admitting: Physician Assistant

## 2019-11-18 NOTE — Telephone Encounter (Signed)
-----   Message from Theodoro Parma, RN sent at 11/17/2019  5:19 PM EDT ----- Farris Has- I didn't see anything was added to this note until after 5 and I couldn't call Nephrology. Will you please take care of this tomorrow please? Thank you so much. See you in a week :) Katy ----- Message ----- From: Theodoro Parma, RN Sent: 11/16/2019   5:52 PM EDT To: Theodoro Parma, RN

## 2019-11-18 NOTE — Telephone Encounter (Signed)
Doristine Bosworth from [facility] was calling to speak to someone from our office about this patient

## 2019-11-18 NOTE — Telephone Encounter (Signed)
Shaquita from [facility] called me back to update me on the decision Dr. Jonn Shingles decided on pt. They are ordering STAT labs on pt for Monday 11/21/19 and then pt will see the PA on Tuesday.

## 2019-11-18 NOTE — Telephone Encounter (Signed)
Kyle Avila returned my call re: pt and increased CR.  She is sending a note to Dr. Jonn Shingles and see if they can get pt in next week and recheck his labs.  She will call back with the final decision.

## 2019-11-18 NOTE — Telephone Encounter (Signed)
Shaquita from [facility] is returning Jennifer's call. Transferred call to [facility].

## 2019-11-18 NOTE — Telephone Encounter (Signed)
Thank you for helping coordinate this!  Will also cc to Dr. Burt Knack to make him aware.  Ayven Glasco PA-C

## 2019-11-18 NOTE — Telephone Encounter (Signed)
Returned call back to [place].  Left another message for pt to call back.

## 2019-11-21 ENCOUNTER — Ambulatory Visit (INDEPENDENT_AMBULATORY_CARE_PROVIDER_SITE_OTHER): Payer: Medicare HMO

## 2019-11-21 ENCOUNTER — Encounter: Payer: Self-pay | Admitting: Pulmonary Disease

## 2019-11-21 ENCOUNTER — Other Ambulatory Visit: Payer: Self-pay

## 2019-11-21 ENCOUNTER — Institutional Professional Consult (permissible substitution): Payer: Medicare HMO | Admitting: Emergency Medicine

## 2019-11-21 ENCOUNTER — Ambulatory Visit: Payer: Medicare HMO | Admitting: Pulmonary Disease

## 2019-11-21 VITALS — BP 124/58 | HR 68 | Temp 98.8°F | Ht 68.0 in | Wt 172.0 lb

## 2019-11-21 DIAGNOSIS — I208 Other forms of angina pectoris: Secondary | ICD-10-CM

## 2019-11-21 DIAGNOSIS — R05 Cough: Secondary | ICD-10-CM | POA: Diagnosis not present

## 2019-11-21 DIAGNOSIS — R059 Cough, unspecified: Secondary | ICD-10-CM

## 2019-11-21 DIAGNOSIS — R053 Chronic cough: Secondary | ICD-10-CM

## 2019-11-21 DIAGNOSIS — N184 Chronic kidney disease, stage 4 (severe): Secondary | ICD-10-CM | POA: Diagnosis not present

## 2019-11-21 MED ORDER — FLUTICASONE-SALMETEROL 250-50 MCG/DOSE IN AEPB: 1.0000 | INHALATION_SPRAY | Freq: Two times a day (BID) | RESPIRATORY_TRACT | 11 refills | Status: DC

## 2019-11-21 NOTE — Patient Instructions (Addendum)
Nice to meet you!  I have ordered a chest xray and breathing tests to help further characterize cough.  Use an inhaler 1 puff twice a day. This is dry powder inhaler. It is called Wixela.   Click this link to view a video on how to use the inhaler.  We will see you back in about 3 months with Dr. Silas Flood. PFT first available.

## 2019-11-21 NOTE — Progress Notes (Signed)
Patient ID: Kyle Avila, male    DOB: [birth year], 74 y.o.   MRN: [id number]  Chief Complaint  Patient presents with  . Consult    cough for over 4 year PCP can't get rid of it , referred to pulmonary, -productive white- usually treated with prednisone, just had a dose of prednisone 2-3 weeks ago    Referring provider: [name], MD  HPI:   [name] is a 74 year old with CAD s/p CABG x 2 (1988 and 1996) and multiple PCIs, chronic exertional angina who we are seeing in consultation for at the request of Dr. Dagmar Hait for evaluation of chronic cough.   Cough present for ~4 years. Cough is severe. First noticed in fall and spring but now present year round. No clear exacerbating factors. Not worse in morning or at night or when supine. Constant throughout the day. Not worse when eating. Occasionally worse when eating dry crackers. Cough improves for weeks at a time after prednisone, usually 10 mg daily x 7 days. Most recent course was ~3 weeks ago.  Trialed flonase for post nasal drip and does not think it improved. Denies heartburn or regurgitation of food. Trialed once a day PPI for "months" without improved cough. Notes when first started his sputum was foul smelling after sneezing. This resolved months to years ago. Endorses occasional sinus pressure that comes and goes. Unsure if cough is worse with pressure present or if prednisone improves sinus pressure. Denies post nasal drip or watery eyes. Does not think cough varies with seasons. No h/o asthma as child or younger adult. Denies DOE but notes significant issues with angina.   Had CXR per PCP for this cough but notes its been at least a couple of years. I can not acces this image. CXR reviewed 12/2005 with new R sided pacemaker placement, lungs clear, no evidence of hyperinflation on my interpretation.   PMH:  Smoker/ Smoking History: never smoker  Social History: Never smoker. Married. Retired from [employer].  Surgical  history: CABG x 2, multiple PCIs  Family history: Father with heart attack in 69s, mother with cancer  Lab Results: Personally reviewed, abs eos 300  CBC    Component Value Date/Time   WBC 5.2 11/01/2019 1627   WBC 5.2 10/19/2019 1210   RBC 3.56 (L) 11/01/2019 1627   RBC 3.68 (L) 10/19/2019 1210   HGB 12.6 (L) 11/01/2019 1627   HCT 38.3 11/01/2019 1627   PLT 165 11/01/2019 1627   MCV 108 (H) 11/01/2019 1627   MCH 35.4 (H) 11/01/2019 1627   MCH 35.6 (H) 10/19/2019 1210   MCHC 32.9 11/01/2019 1627   MCHC 32.9 10/19/2019 1210   RDW 12.4 11/01/2019 1627   LYMPHSABS 1.1 10/14/2019 0900   MONOABS 0.7 09/04/2014 1542   EOSABS 0.3 10/14/2019 0900   BASOSABS 0.0 10/14/2019 0900    BMET    Component Value Date/Time   NA 139 11/15/2019 1548   K 5.1 11/15/2019 1548   CL 100 11/15/2019 1548   CO2 25 11/15/2019 1548   GLUCOSE 106 (H) 11/15/2019 1548   GLUCOSE 104 (H) 10/19/2019 0306   BUN 48 (H) 11/15/2019 1548   CREATININE 2.67 (H) 11/15/2019 1548   CALCIUM 9.0 11/15/2019 1548   GFRNONAA 23 (L) 11/15/2019 1548   GFRAA 26 (L) 11/15/2019 1548    BNP No results found for: BNP  ProBNP No results found for: PROBNP   Imaging: Personally reviewed and as interpretation in HPI above.  Specialty  Problems      Pulmonary Problems   Chronic pansinusitis   Chronic cough      No Known Allergies  Immunization History  Administered Date(s) Administered  . Influenza, High Dose Seasonal PF 01/31/2019  . PFIZER SARS-COV-2 Vaccination 06/02/2019, 06/27/2019    Past Medical History:  Diagnosis Date  . Arthritis    "some in my back" (04/14/2017)  . CAD (coronary artery disease)    a.  s/p CABG 1988 with redo in 1996 with multiple PCIs since then  . Carotid artery occlusion    s/p RCEA  . CKD (chronic kidney disease), stage IV ([place])   . Complete heart block (HCC)    s/p PTVDP  . Gout   . HTN (hypertension)   . Hyperlipidemia   . Lupus ([place])    "that's why my kidneys  are gone; lupus attacked them" (04/14/2017)  . Mitral insufficiency   . Myocardial infarction ([place])    "I've had some mild one" (04/14/2017)  . Presence of permanent cardiac pacemaker   . Skin cancer    "nose"  . VT (ventricular tachycardia)-nonsustained 05/20/2011    Tobacco History: Social History   Tobacco Use  Smoking Status Passive Smoke Exposure - Never Smoker  Smokeless Tobacco Never Used   Counseling given: Not Answered   Continue to not smoke  Outpatient Encounter Medications as of 11/21/2019  Medication Sig  . allopurinol (ZYLOPRIM) 100 MG tablet Take 100 mg by mouth daily.   Marland Kitchen amLODipine (NORVASC) 10 MG tablet Take 1 tablet (10 mg total) by mouth daily.  Marland Kitchen aspirin EC 81 MG EC tablet Take 81 mg by mouth daily.   . clopidogrel (PLAVIX) 75 MG tablet Take 1 tablet (75 mg total) by mouth daily.  . Evolocumab (REPATHA) 140 MG/ML SOSY Inject 140 mg into the skin every 14 (fourteen) days.   . fluticasone (FLONASE) 50 MCG/ACT nasal spray Place 2 sprays into both nostrils in the morning and at bedtime.  . furosemide (LASIX) 40 MG tablet Take 1 tablet (40 mg total) by mouth daily.  . isosorbide mononitrate (IMDUR) 120 MG 24 hr tablet Take 1 tablet (120 mg total) by mouth daily.  Marland Kitchen ketoconazole (NIZORAL) 2 % cream Apply 1 application topically daily as needed for irritation.  Marland Kitchen losartan (COZAAR) 100 MG tablet Take 1 tablet (100 mg total) by mouth daily.  . metoprolol succinate (TOPROL-XL) 50 MG 24 hr tablet Take 1 tablet (50 mg total) by mouth 2 (two) times daily. Take with or immediately following meals.  Marland Kitchen NITROSTAT 0.4 MG SL tablet PLACE ONE TABLET UNDER THE TONGUE EVERY FIVE MINUTES AS NEEDED FOR CHEST PAIN  . ranolazine (RANEXA) 1000 MG SR tablet Take 1 tablet (1,000 mg total) by mouth 2 (two) times daily.  . tamsulosin (FLOMAX) 0.4 MG CAPS Take 0.4 mg by mouth 2 (two) times a day.   . Fluticasone-Salmeterol (WIXELA INHUB) 250-50 MCG/DOSE AEPB Inhale 1 puff into the lungs 2  (two) times daily.   No facility-administered encounter medications on file as of 11/21/2019.     Review of Systems  Review of Systems  No headache. Has consistent but improved exertional angina. Comprehensive review of symptoms otherwise negative.  Physical Exam  BP (!) 124/58 (BP Location: Right Arm, Cuff Size: Normal)   Pulse 68   Temp 98.8 F (37.1 C) (Oral)   Ht 5\' 8"  (1.727 m)   Wt 172 lb (78 kg)   SpO2 94%   BMI 26.15 kg/m   Wt  Readings from Last 5 Encounters:  11/21/19 172 lb (78 kg)  11/10/19 169 lb 12.8 oz (77 kg)  11/01/19 172 lb 9.6 oz (78.3 kg)  10/19/19 170 lb 6.4 oz (77.3 kg)  10/14/19 171 lb 9.6 oz (77.8 kg)    BMI Readings from Last 5 Encounters:  11/21/19 26.15 kg/m  11/10/19 25.82 kg/m  11/01/19 26.24 kg/m  10/19/19 25.91 kg/m  10/14/19 26.09 kg/m     Physical Exam General: well appearing, well groomed Eyes: EOMI, no icterus Mouth: Dentition normal, no erythema of OP Respiratory: NWOB, lungs CTAB, no wheeze Cardiovascular: RRR, no murmurs, no gallops Abdomen/GI: no pain upon palpation, normal bowel sounds Musculoskeletal: Normal bulk, normal tone, no evidence of synovitis Skin: Warm, no rash, no lesions Neurologic: normal gait, CN intact Psych: Normal mood, full affect   Assessment & Plan:   Mr. Zoll is a 74 year old with CAD s/p CABG x 2 (1988 and 1996) and multiple PCIs, chronic exertional angina who presents with chief comlpaint of chronic cough.   Differential for chronic cough includes silent reflux, chronic sinusitis and post nasal drip, cough variant asthma, and eosinophilic bronchitis. He is a never smoker.  He has trialed PPI without improvement and denies other GERD sym,ptoms making GERD less likely. He improves with prednisone. High suspicion for cough variant asthma or eosinophilic bronchitis. Notably abs eos 300 on CBC 09/2019. Will begin therapy with Wixela mid dose BID.  Given chronicity of cough, will obtain CXR to  evaluate parenchymal causes of cough. Spiro pre/post, DLCO, lung volumes ordered to better evaluate asthma as culprit of cough.  If symptoms not improving, will consider additional evaluation of sinusitis with CT, possible referral to ENT. Sinusitis could improve with steroids. Consider EGPA given sinusitis, cough, and elevated Eos.     Return in about 3 months (around 02/21/2020).   Larey Days 11/21/2019   This appointment required 77 minutes of patient care (this includes precharting, chart review, review of results, face-to-face care, etc.).

## 2019-11-22 DIAGNOSIS — N184 Chronic kidney disease, stage 4 (severe): Secondary | ICD-10-CM | POA: Diagnosis not present

## 2019-11-22 DIAGNOSIS — I129 Hypertensive chronic kidney disease with stage 1 through stage 4 chronic kidney disease, or unspecified chronic kidney disease: Secondary | ICD-10-CM | POA: Diagnosis not present

## 2019-11-22 DIAGNOSIS — R809 Proteinuria, unspecified: Secondary | ICD-10-CM | POA: Diagnosis not present

## 2019-11-22 DIAGNOSIS — M109 Gout, unspecified: Secondary | ICD-10-CM | POA: Diagnosis not present

## 2019-11-22 DIAGNOSIS — I2510 Atherosclerotic heart disease of native coronary artery without angina pectoris: Secondary | ICD-10-CM | POA: Diagnosis not present

## 2019-11-25 LAB — CUP PACEART INCLINIC DEVICE CHECK
Battery Impedance: 351 Ohm
Battery Remaining Longevity: 88 mo
Battery Voltage: 2.78 V
Brady Statistic AP VP Percent: 70 %
Brady Statistic AP VS Percent: 0 %
Brady Statistic AS VP Percent: 30 %
Brady Statistic AS VS Percent: 0 %
Date Time Interrogation Session: 20210720152400
Implantable Lead Implant Date: 20070831
Implantable Lead Implant Date: 20070831
Implantable Lead Location: 753859
Implantable Lead Location: 753860
Implantable Lead Model: 4469
Implantable Lead Model: 4470
Implantable Lead Serial Number: 485949
Implantable Lead Serial Number: 553665
Implantable Pulse Generator Implant Date: 20160518
Lead Channel Impedance Value: 472 Ohm
Lead Channel Impedance Value: 489 Ohm
Lead Channel Pacing Threshold Amplitude: 0.625 V
Lead Channel Pacing Threshold Amplitude: 0.625 V
Lead Channel Pacing Threshold Pulse Width: 0.4 ms
Lead Channel Pacing Threshold Pulse Width: 0.4 ms
Lead Channel Sensing Intrinsic Amplitude: 1.4 mV
Lead Channel Setting Pacing Amplitude: 2 V
Lead Channel Setting Pacing Amplitude: 2.5 V
Lead Channel Setting Pacing Pulse Width: 0.46 ms
Lead Channel Setting Sensing Sensitivity: 2.8 mV

## 2019-11-25 NOTE — Progress Notes (Signed)
Pacemaker check in clinic due to period of feeling like he had no enrgy. Patient reports that he discovered he had inadvertently omitted his Norvasc for a period of time. He reports he now feels great. Normal device function. Thresholds, sensing, impedances consistent with previous measurements. Device programmed to maximize longevity. Mode switch < 0.1 % of the time, 2 Atrial high rates with the longest lasting 48 minutes , 34 seconds.  No high ventricular rates noted. Device programmed at appropriate safety margins. Histogram distribution appropriate for patient activity level. Device programmed to optimize intrinsic conduction. Estimated longevity 7 yrs 4 months.. Patient enrolled in remote follow-up and next remote scheduled for 12/21/19. Dr Caryl Comes in to see patient and no changes made. Patient education completed.

## 2019-11-30 ENCOUNTER — Ambulatory Visit (INDEPENDENT_AMBULATORY_CARE_PROVIDER_SITE_OTHER): Payer: Medicare HMO | Admitting: Pulmonary Disease

## 2019-11-30 ENCOUNTER — Other Ambulatory Visit: Payer: Self-pay

## 2019-11-30 DIAGNOSIS — R05 Cough: Secondary | ICD-10-CM | POA: Diagnosis not present

## 2019-11-30 DIAGNOSIS — R059 Cough, unspecified: Secondary | ICD-10-CM

## 2019-11-30 LAB — PULMONARY FUNCTION TEST
DL/VA % pred: 83 %
DL/VA: 3.35 ml/min/mmHg/L
DLCO cor % pred: 80 %
DLCO cor: 18.91 ml/min/mmHg
DLCO unc % pred: 75 %
DLCO unc: 17.75 ml/min/mmHg
FEF 25-75 Post: 2.49 L/sec
FEF 25-75 Pre: 2.62 L/sec
FEF2575-%Change-Post: -5 %
FEF2575-%Pred-Post: 121 %
FEF2575-%Pred-Pre: 128 %
FEV1-%Change-Post: 0 %
FEV1-%Pred-Post: 102 %
FEV1-%Pred-Pre: 101 %
FEV1-Post: 2.85 L
FEV1-Pre: 2.85 L
FEV1FVC-%Change-Post: 6 %
FEV1FVC-%Pred-Pre: 109 %
FEV6-%Change-Post: -4 %
FEV6-%Pred-Post: 93 %
FEV6-%Pred-Pre: 97 %
FEV6-Post: 3.38 L
FEV6-Pre: 3.53 L
FEV6FVC-%Change-Post: 1 %
FEV6FVC-%Pred-Post: 106 %
FEV6FVC-%Pred-Pre: 105 %
FVC-%Change-Post: -5 %
FVC-%Pred-Post: 87 %
FVC-%Pred-Pre: 92 %
FVC-Post: 3.38 L
FVC-Pre: 3.58 L
Post FEV1/FVC ratio: 84 %
Post FEV6/FVC ratio: 100 %
Pre FEV1/FVC ratio: 79 %
Pre FEV6/FVC Ratio: 99 %
RV % pred: 85 %
RV: 2.04 L
TLC % pred: 88 %
TLC: 5.77 L

## 2019-11-30 NOTE — Progress Notes (Signed)
PFT done today. 

## 2019-12-03 ENCOUNTER — Other Ambulatory Visit: Payer: Self-pay | Admitting: Cardiovascular Disease

## 2019-12-09 ENCOUNTER — Other Ambulatory Visit: Payer: Self-pay

## 2019-12-09 MED ORDER — METOPROLOL SUCCINATE ER 50 MG PO TB24: 50.0000 mg | ORAL_TABLET | Freq: Two times a day (BID) | ORAL | 3 refills | Status: DC

## 2019-12-19 MED ORDER — LOSARTAN POTASSIUM 100 MG PO TABS: 100.0000 mg | ORAL_TABLET | Freq: Every day | ORAL | 0 refills | Status: DC

## 2019-12-19 MED ORDER — ISOSORBIDE MONONITRATE ER 120 MG PO TB24: 120.0000 mg | ORAL_TABLET | Freq: Every day | ORAL | 0 refills | Status: DC

## 2019-12-19 MED ORDER — AMLODIPINE BESYLATE 10 MG PO TABS: 10.0000 mg | ORAL_TABLET | Freq: Every day | ORAL | 0 refills | Status: DC

## 2019-12-19 MED ORDER — CLOPIDOGREL BISULFATE 75 MG PO TABS: 75.0000 mg | ORAL_TABLET | Freq: Every day | ORAL | 0 refills | Status: AC

## 2019-12-19 NOTE — Telephone Encounter (Signed)
The patient requests 1 of each tablet (losartan, imdur, amlodipine, and Plavix) sent to pharmacy in [place] as he forgot his medications. Sent per request. He was grateful for assistance.

## 2019-12-20 NOTE — Progress Notes (Signed)
Cardiology Office Note:    Date:  12/21/2019   ID:  Kyle Avila, DOB [birth year], MRN [id number]  PCP:  Kyle Solian, MD  Cardiologist:  Kyle Mocha, MD  Electrophysiologist:  Kyle Axe, MD   Referring MD: Kyle Solian, MD   Chief Complaint:  Follow-up (CAD)    Patient Profile:    Kyle Avila is a 74 y.o. male with:   Coronary artery disease   S/p CABG in 1988; redo in 1996  S/p multiple PCI procedures (DES x 2 to LCx in 2009, DES to S-PDA in 2016, DES x 2 to S-PDA in 2018)  S/p DES to RPDA and cutting balloon POBA to S-RPDA (ISR) 8/89  Combined systolic and diastolic CHF  Ischemic CM  Echocardiogram 2/18: EF 45-50  Echocardiogram 4/21: EF 45-50  Carotid artery disease  S/p R CEA   CHB, s/p pacemaker   Chronic kidney disease   Hypertension   Hyperlipidemia   Prior CV studies: Cardiac catheterization Oct 25, 2019 LM 100 LAD ost 100, mid 49 at anastomosis, dist 79 RI ost 100 LCx ost 20; OM1 100 RCA 100 CTO; RPDA 95 S-RPDA prox stent patent w 10 ISR, dist stent 90 ISR S-OM1 100 CTO L-LAD patent  PCI:  2.5 x 12 mm Resolute Onyx DES to RPDA; cutting balloon POBA to S-RPDA     Echocardiogram 08/19/19 EF 45-50, Gr 2 DD, inf-lat HK, normal RVSF, RVSP 31.5, mod BAE, mild MR, trivial AI  Echo 06/24/2016 Mild concentric LVH, EF 45-50, inferolateral/inferior/inferoseptal hypokinesis, grade 1 diastolic dysfunction, mild MR, mild LAE, mildly reduced RVSF, mild TR, PASP 32  Myoview 02/2014 Intermediate risk stress nuclear study with a fixed medium-sized, severe basal to mid inferolateral and basal to mid anterolateral perfusion defect. This suggests prior infarction without significant ischemia. Study rated intermediate risk due to low EF at 42%. LV Ejection Fraction: 42%. LV Wall Motion: Lateral and inferior hypokinesis.   Carotid US 04/6943 R CEA patent LICA < 03%  History of Present Illness:    Kyle Avila was last seen in clinic by Dr.  Burt Avila in 10/2019.  He had been having continued angina since his last PCI in 09/2019 but had inadvertently stopped his Amlodipine.  He resumed this and was feeling better with better controlled BP.  He returns for follow up.  He is here alone.  Since last seen, he has been doing well.  He usually takes a sublingual nitroglycerin prior to going for his walk.  He is able to complete his walk without anginal symptoms.  He does feel tired but no significant shortness of breath.  He has not had syncope, orthopnea.  He has some mild pedal edema that improves with elevation.  Past Medical History:  Diagnosis Date  . Arthritis    "some in my back" (04/14/2017)  . CAD (coronary artery disease)    a.  s/p CABG 1988 with redo in 1996 with multiple PCIs since then  . Carotid artery occlusion    s/p RCEA  . CKD (chronic kidney disease), stage IV ([place])   . Complete heart block (HCC)    s/p PTVDP  . Gout   . HTN (hypertension)   . Hyperlipidemia   . 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 Mitral insufficiency   . Myocardial infarction ([place])    "I've had some mild one" (04/14/2017)  . Presence of permanent cardiac pacemaker   . Skin cancer    "  nose"  . VT (ventricular tachycardia)-nonsustained 05/20/2011    Current Medications: Current Meds  Medication Sig  . allopurinol (ZYLOPRIM) 100 MG tablet Take 100 mg by mouth daily.   Marland Kitchen amLODipine (NORVASC) 10 MG tablet Take 1 tablet (10 mg total) by mouth daily for 1 dose.  Marland Kitchen aspirin EC 81 MG EC tablet Take 81 mg by mouth daily.   . Evolocumab (REPATHA) 140 MG/ML SOSY Inject 140 mg into the skin every 14 (fourteen) days.   . fluticasone (FLONASE) 50 MCG/ACT nasal spray Place 2 sprays into both nostrils in the morning and at bedtime.  . Fluticasone-Salmeterol (WIXELA INHUB) 250-50 MCG/DOSE AEPB Inhale 1 puff into the lungs 2 (two) times daily.  . furosemide (LASIX) 40 MG tablet Take 1 tablet (40 mg total) by mouth  daily.  . isosorbide mononitrate (IMDUR) 120 MG 24 hr tablet Take 1 tablet (120 mg total) by mouth daily for 1 dose.  Marland Kitchen ketoconazole (NIZORAL) 2 % cream Apply 1 application topically daily as needed for irritation.  Marland Kitchen losartan (COZAAR) 100 MG tablet Take 1 tablet (100 mg total) by mouth daily for 1 dose.  . metoprolol succinate (TOPROL-XL) 50 MG 24 hr tablet Take 1 tablet (50 mg total) by mouth 2 (two) times daily. Take with or immediately following meals.  Marland Kitchen NITROSTAT 0.4 MG SL tablet PLACE ONE TABLET UNDER THE TONGUE EVERY FIVE MINUTES AS NEEDED FOR CHEST PAIN  . ranolazine (RANEXA) 1000 MG SR tablet Take 1 tablet (1,000 mg total) by mouth 2 (two) times daily.  . tamsulosin (FLOMAX) 0.4 MG CAPS Take 0.4 mg by mouth 2 (two) times a day.      Allergies:   Patient has no known allergies.   Social History   Tobacco Use  . Smoking status: Passive Smoke Exposure - Never Smoker  . Smokeless tobacco: Never Used  Vaping Use  . Vaping Use: Never used  Substance Use Topics  . Alcohol use: Yes    Alcohol/week: 3.0 standard drinks    Types: 1 Glasses of wine, 1 Cans of beer, 1 Standard drinks or equivalent per week    Comment: \  . Drug use: No     Family Hx: [name] family history includes Cancer in his mother; Heart attack in his father; Heart disease in his father.  ROS   EKGs/Labs/Other Test Reviewed:    EKG:  EKG is not ordered today.  The ekg ordered today demonstrates n/a  Recent Labs: 11/01/2019: Hemoglobin 12.6; Platelets 165; TSH 5.720 11/15/2019: BUN 48; Creatinine, Ser 2.67; Potassium 5.1; Sodium 139   Recent Lipid Panel Lab Results  Component Value Date/Time   CHOL 133 10/28/2019 08:20 AM   TRIG 254 (H) 10/28/2019 08:20 AM   HDL 41 10/28/2019 08:20 AM   CHOLHDL 3.2 10/28/2019 08:20 AM   CHOLHDL 2.4 10/19/2019 03:06 AM   LDLCALC 52 10/28/2019 08:20 AM   LDLDIRECT 47 10/28/2019 08:20 AM    Physical Exam:    VS:  BP (!) 130/40   Pulse 75   Ht 5\' 8"  (1.727 m)    Wt 173 lb (78.5 kg)   SpO2 92%   BMI 26.30 kg/m     Wt Readings from Last 3 Encounters:  12/21/19 173 lb (78.5 kg)  11/21/19 172 lb (78 kg)  11/10/19 169 lb 12.8 oz (77 kg)     Constitutional:      Appearance: Healthy appearance. Not in distress.  Neck:     Vascular: JVD normal.  Pulmonary:  Effort: Pulmonary effort is normal.     Breath sounds: No wheezing. No rales.  Cardiovascular:     Normal rate. Regular rhythm. Normal S1. Normal S2.     Murmurs: There is no murmur.  Edema:    Ankle: bilateral trace edema of the ankle. Abdominal:     Palpations: Abdomen is soft.  Skin:    General: Skin is warm and dry.  Neurological:     General: No focal deficit present.     Mental Status: Alert and oriented to person, place and time.     Cranial Nerves: Cranial nerves are intact.      ASSESSMENT & PLAN:    1. Coronary artery disease involving coronary bypass graft of native heart with stable angina pectoris ([place]) History of bypass in 1998 and redo bypass in 1996 as well as multiple percutaneous coronary interventions since then.  His most recent PCI was in June 2021 with a drug-eluting stent to the RPDA and balloon angioplasty to the vein graft to the RPDA.  He was having increasing anginal symptoms but was off of his amlodipine.  He is doing well with stable anginal symptoms on his current medical regimen.  Continue current dose of amlodipine, aspirin, evolocumab, isosorbide mononitrate, metoprolol succinate, ranolazine.  Follow-up with Kyle Avila in 6 months.  2. Chronic combined systolic and diastolic CHF (congestive heart failure) (HCC) EF 45-50.  Ischemic cardiomyopathy.  NYHA II-IIb.  Volume status stable.  Continue current dose of furosemide, losartan, metoprolol succinate.  3. Essential hypertension [name] blood pressure is controlled on his current regimen.  Continue current therapy.   4. CKD (chronic kidney disease), stage IV (HCC) His creatinine did increase  after his cardiac catheterization and PCI.  He notes that he had follow-up labs with his nephrologist with improved creatinine.  We will request those results for his chart.     Dispo:  Return in about 6 months (around 06/22/2020) for Routine Follow Up, w/ Kyle Avila, in person.   Medication Adjustments/Labs and Tests Ordered: Current medicines are reviewed at length with the patient today.  Concerns regarding medicines are outlined above.  Tests Ordered: No orders of the defined types were placed in this encounter.  Medication Changes: No orders of the defined types were placed in this encounter.   Signed, Richardson Dopp, PA-C  12/21/2019 10:00 AM    [facility] [place], [place], [place]  [address] Phone: [phone]; Fax: [phone]

## 2019-12-21 ENCOUNTER — Other Ambulatory Visit: Payer: Self-pay

## 2019-12-21 ENCOUNTER — Ambulatory Visit (INDEPENDENT_AMBULATORY_CARE_PROVIDER_SITE_OTHER): Payer: Medicare HMO | Admitting: *Deleted

## 2019-12-21 ENCOUNTER — Ambulatory Visit: Payer: Medicare HMO | Admitting: Physician Assistant

## 2019-12-21 ENCOUNTER — Encounter: Payer: Self-pay | Admitting: Physician Assistant

## 2019-12-21 VITALS — BP 130/40 | HR 75 | Ht 68.0 in | Wt 173.0 lb

## 2019-12-21 DIAGNOSIS — I10 Essential (primary) hypertension: Secondary | ICD-10-CM | POA: Diagnosis not present

## 2019-12-21 DIAGNOSIS — N184 Chronic kidney disease, stage 4 (severe): Secondary | ICD-10-CM

## 2019-12-21 DIAGNOSIS — I5042 Chronic combined systolic (congestive) and diastolic (congestive) heart failure: Secondary | ICD-10-CM

## 2019-12-21 DIAGNOSIS — I25708 Atherosclerosis of coronary artery bypass graft(s), unspecified, with other forms of angina pectoris: Secondary | ICD-10-CM

## 2019-12-21 DIAGNOSIS — I442 Atrioventricular block, complete: Secondary | ICD-10-CM

## 2019-12-21 NOTE — Patient Instructions (Addendum)
Medication Instructions:  Your physician recommends that you continue on your current medications as directed. Please refer to the Current Medication list given to you today.  *If you need a refill on your cardiac medications before your next appointment, please call your pharmacy*  Lab Work: None ordered today  If you have labs (blood work) drawn today and your tests are completely normal, you will receive your results only by: Marland Kitchen MyChart Message (if you have MyChart) OR . A paper copy in the mail If you have any lab test that is abnormal or we need to change your treatment, we will call you to review the results.  Testing/Procedures: None ordered today  Follow-Up: On 06/26/19 at 9:20AM with [name], MD

## 2019-12-27 LAB — CUP PACEART REMOTE DEVICE CHECK
Battery Impedance: 376 Ohm
Battery Remaining Longevity: 89 mo
Battery Voltage: 2.78 V
Brady Statistic AP VP Percent: 59 %
Brady Statistic AP VS Percent: 0 %
Brady Statistic AS VP Percent: 40 %
Brady Statistic AS VS Percent: 1 %
Date Time Interrogation Session: 20210830174700
Implantable Lead Implant Date: 20070831
Implantable Lead Implant Date: 20070831
Implantable Lead Location: 753859
Implantable Lead Location: 753860
Implantable Lead Model: 4469
Implantable Lead Model: 4470
Implantable Lead Serial Number: 485949
Implantable Lead Serial Number: 553665
Implantable Pulse Generator Implant Date: 20160518
Lead Channel Impedance Value: 461 Ohm
Lead Channel Impedance Value: 496 Ohm
Lead Channel Pacing Threshold Amplitude: 0.625 V
Lead Channel Pacing Threshold Amplitude: 0.625 V
Lead Channel Pacing Threshold Pulse Width: 0.4 ms
Lead Channel Pacing Threshold Pulse Width: 0.4 ms
Lead Channel Setting Pacing Amplitude: 2 V
Lead Channel Setting Pacing Amplitude: 2.5 V
Lead Channel Setting Pacing Pulse Width: 0.4 ms
Lead Channel Setting Sensing Sensitivity: 2.8 mV

## 2019-12-29 NOTE — Progress Notes (Signed)
Remote pacemaker transmission.   

## 2019-12-30 DIAGNOSIS — R413 Other amnesia: Secondary | ICD-10-CM | POA: Diagnosis not present

## 2019-12-30 DIAGNOSIS — I6521 Occlusion and stenosis of right carotid artery: Secondary | ICD-10-CM | POA: Diagnosis not present

## 2019-12-30 DIAGNOSIS — I25118 Atherosclerotic heart disease of native coronary artery with other forms of angina pectoris: Secondary | ICD-10-CM | POA: Diagnosis not present

## 2019-12-30 DIAGNOSIS — N184 Chronic kidney disease, stage 4 (severe): Secondary | ICD-10-CM | POA: Diagnosis not present

## 2019-12-30 DIAGNOSIS — E785 Hyperlipidemia, unspecified: Secondary | ICD-10-CM | POA: Diagnosis not present

## 2019-12-30 DIAGNOSIS — Z950 Presence of cardiac pacemaker: Secondary | ICD-10-CM | POA: Diagnosis not present

## 2020-01-03 ENCOUNTER — Other Ambulatory Visit: Payer: Self-pay | Admitting: Internal Medicine

## 2020-01-03 DIAGNOSIS — R413 Other amnesia: Secondary | ICD-10-CM

## 2020-01-06 NOTE — Telephone Encounter (Signed)
Dr. Silas Flood please advise on patient mychart message  after a month of Wixela use my cough is greatly reduced. Still coughing up a bit of junk but, the cough reduction is the critical issue.  Kyle Avila

## 2020-01-12 ENCOUNTER — Ambulatory Visit
Admission: RE | Admit: 2020-01-12 | Discharge: 2020-01-12 | Disposition: A | Discharge status: Home / Self Care | Payer: Medicare HMO | Source: Ambulatory Visit | Attending: Internal Medicine | Admitting: Internal Medicine

## 2020-01-12 ENCOUNTER — Other Ambulatory Visit: Payer: Self-pay

## 2020-01-12 ENCOUNTER — Telehealth: Payer: Self-pay | Admitting: *Deleted

## 2020-01-12 DIAGNOSIS — R413 Other amnesia: Secondary | ICD-10-CM

## 2020-01-12 DIAGNOSIS — M79604 Pain in right leg: Secondary | ICD-10-CM | POA: Diagnosis not present

## 2020-01-12 DIAGNOSIS — M545 Low back pain: Secondary | ICD-10-CM | POA: Diagnosis not present

## 2020-01-12 DIAGNOSIS — J3489 Other specified disorders of nose and nasal sinuses: Secondary | ICD-10-CM | POA: Diagnosis not present

## 2020-01-12 DIAGNOSIS — M79661 Pain in right lower leg: Secondary | ICD-10-CM | POA: Diagnosis not present

## 2020-01-12 DIAGNOSIS — G319 Degenerative disease of nervous system, unspecified: Secondary | ICD-10-CM | POA: Diagnosis not present

## 2020-01-12 DIAGNOSIS — I6782 Cerebral ischemia: Secondary | ICD-10-CM | POA: Diagnosis not present

## 2020-01-12 NOTE — Telephone Encounter (Signed)
Received call from scheduler with patient on the line asking about process for obtaining new [employer] ID card.  Relayed phone number for Patient Registration Services.

## 2020-01-27 ENCOUNTER — Ambulatory Visit
Admission: RE | Admit: 2020-01-27 | Discharge: 2020-01-27 | Disposition: A | Discharge status: Home / Self Care | Payer: Medicare HMO | Source: Ambulatory Visit | Attending: Physician Assistant | Admitting: Physician Assistant

## 2020-01-27 ENCOUNTER — Other Ambulatory Visit: Payer: Self-pay | Admitting: Physician Assistant

## 2020-01-27 DIAGNOSIS — M47816 Spondylosis without myelopathy or radiculopathy, lumbar region: Secondary | ICD-10-CM | POA: Diagnosis not present

## 2020-01-27 DIAGNOSIS — M48061 Spinal stenosis, lumbar region without neurogenic claudication: Secondary | ICD-10-CM | POA: Diagnosis not present

## 2020-01-27 DIAGNOSIS — M545 Low back pain, unspecified: Secondary | ICD-10-CM

## 2020-01-30 DIAGNOSIS — M545 Low back pain, unspecified: Secondary | ICD-10-CM | POA: Diagnosis not present

## 2020-01-30 DIAGNOSIS — M79661 Pain in right lower leg: Secondary | ICD-10-CM | POA: Diagnosis not present

## 2020-02-01 ENCOUNTER — Other Ambulatory Visit: Payer: Self-pay

## 2020-02-01 DIAGNOSIS — I6529 Occlusion and stenosis of unspecified carotid artery: Secondary | ICD-10-CM

## 2020-02-01 DIAGNOSIS — I739 Peripheral vascular disease, unspecified: Secondary | ICD-10-CM

## 2020-02-06 ENCOUNTER — Other Ambulatory Visit (HOSPITAL_COMMUNITY): Payer: Self-pay | Admitting: Internal Medicine

## 2020-02-10 DIAGNOSIS — R059 Cough, unspecified: Secondary | ICD-10-CM | POA: Diagnosis not present

## 2020-02-10 DIAGNOSIS — Z20822 Contact with and (suspected) exposure to covid-19: Secondary | ICD-10-CM | POA: Diagnosis not present

## 2020-02-10 DIAGNOSIS — R5383 Other fatigue: Secondary | ICD-10-CM | POA: Diagnosis not present

## 2020-02-13 DIAGNOSIS — Z1152 Encounter for screening for COVID-19: Secondary | ICD-10-CM | POA: Diagnosis not present

## 2020-02-13 DIAGNOSIS — R600 Localized edema: Secondary | ICD-10-CM | POA: Diagnosis not present

## 2020-02-13 DIAGNOSIS — J040 Acute laryngitis: Secondary | ICD-10-CM | POA: Diagnosis not present

## 2020-02-13 DIAGNOSIS — R059 Cough, unspecified: Secondary | ICD-10-CM | POA: Diagnosis not present

## 2020-02-13 DIAGNOSIS — I129 Hypertensive chronic kidney disease with stage 1 through stage 4 chronic kidney disease, or unspecified chronic kidney disease: Secondary | ICD-10-CM | POA: Diagnosis not present

## 2020-02-13 DIAGNOSIS — R490 Dysphonia: Secondary | ICD-10-CM | POA: Diagnosis not present

## 2020-02-13 DIAGNOSIS — N184 Chronic kidney disease, stage 4 (severe): Secondary | ICD-10-CM | POA: Diagnosis not present

## 2020-02-13 NOTE — Telephone Encounter (Signed)
Dr. Silas Flood, Please see patient comment from the website and advise.  Thank you.

## 2020-02-20 NOTE — Telephone Encounter (Signed)
10:35 AM On Friday Oct 15th I asked Dr Dagmar Hait for some Prednisone which I took on Friday. I had lost my voice and generally felt tired and Often coughing. 20mg  for 3 days and then 10mg  3 more days. Generally I had been feeling OK but, when I coughed I always brought up some [place] from somewhere. I stopped the Wixela inhub II thought it might be causing me to loose my voice. I'm going to ask Dr Dagmar Hait for a small daily dose of Prednisone if my symptoms improve during this current course of Prednisone. Your thoughts are important!  Dr. Silas Flood. above is a previous message the patient sent to [place], it was routed to you, but we did not receive any follow up.  The patient is requesting your thoughts.  Thank you.

## 2020-02-24 ENCOUNTER — Other Ambulatory Visit: Payer: Self-pay

## 2020-02-24 ENCOUNTER — Encounter: Payer: Self-pay | Admitting: Vascular Surgery

## 2020-02-24 ENCOUNTER — Ambulatory Visit (HOSPITAL_COMMUNITY)
Admission: RE | Admit: 2020-02-24 | Discharge: 2020-02-24 | Disposition: A | Discharge status: Home / Self Care | Payer: Medicare HMO | Source: Ambulatory Visit | Attending: Vascular Surgery | Admitting: Vascular Surgery

## 2020-02-24 ENCOUNTER — Ambulatory Visit (INDEPENDENT_AMBULATORY_CARE_PROVIDER_SITE_OTHER)
Admission: RE | Admit: 2020-02-24 | Discharge: 2020-02-24 | Disposition: A | Discharge status: Home / Self Care | Payer: Medicare HMO | Source: Ambulatory Visit | Attending: Vascular Surgery | Admitting: Vascular Surgery

## 2020-02-24 ENCOUNTER — Ambulatory Visit (INDEPENDENT_AMBULATORY_CARE_PROVIDER_SITE_OTHER): Payer: Medicare HMO | Admitting: Vascular Surgery

## 2020-02-24 VITALS — BP 149/71 | HR 63 | Temp 98.0°F | Resp 20 | Ht 68.0 in | Wt 173.6 lb

## 2020-02-24 DIAGNOSIS — I739 Peripheral vascular disease, unspecified: Secondary | ICD-10-CM | POA: Insufficient documentation

## 2020-02-24 DIAGNOSIS — I6523 Occlusion and stenosis of bilateral carotid arteries: Secondary | ICD-10-CM | POA: Diagnosis not present

## 2020-02-24 DIAGNOSIS — I6529 Occlusion and stenosis of unspecified carotid artery: Secondary | ICD-10-CM | POA: Insufficient documentation

## 2020-02-24 NOTE — Telephone Encounter (Signed)
Forwarded to [employer].

## 2020-02-24 NOTE — Progress Notes (Signed)
CHIEF COMPLAINT:   Resolved LE pain  ASSESSMENT & PLAN:  74 y.o. male with mild bilateral asymptomatic carotid artery stenosis. No evidence of peripheral arterial disease in lower extremities. - Continue ASA, Statin. - Defer surveillance of carotid stenosis to PCP - Follow up PRN  Kyle Avila. Stanford Breed, MD [facility] Office: [phone] 02/24/2020  HISTORY:   HISTORY OF PRESENT ILLNESS: Kyle Avila is a 74 y.o. male who presents to clinic for evaluation of right lower extremity pain.  This is resolved since he made the appointment several months ago.  At the time he reports burning pain over the anterior aspect of the calf onto the foot.  Pain was not consistent with intermittent claudication (i.e. he did not have cramping calf pain with ambulation).  He denies pain consistent with ischemic rest pain.  He denies history of ulceration.  History significant for carotid artery stenosis which required carotid endarterectomy with Dr. Donnetta Hutching in 2002.  He has severe coronary artery disease and has undergone 2 CABG procedures and multiple PCI's.  Past Medical History:  Diagnosis Date  . Arthritis    "some in my back" (04/14/2017)  . CAD (coronary artery disease)    a.  s/p CABG 1988 with redo in 1996 with multiple PCIs since then  . Carotid artery occlusion    s/p RCEA  . CKD (chronic kidney disease), stage IV (Kyle Avila)   . Complete heart block (HCC)    s/p PTVDP  . Gout   . HTN (hypertension)   . Hyperlipidemia   . 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 Mitral insufficiency   . Myocardial infarction ([place])    "I've had some mild one" (04/14/2017)  . Presence of permanent cardiac pacemaker   . Skin cancer    "nose"  . 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 ACHILLES TENDON REPAIR Right   . CARDIAC CATHETERIZATION    . CARDIAC  CATHETERIZATION N/A 11/03/2014   Procedure: Left Heart Cath and Cors/Grafts Angiography;  Surgeon: [name], MD;  Location: [facility];  Service: Cardiovascular;  Laterality: N/A;  . CARDIAC CATHETERIZATION N/A 11/03/2014   Procedure: Coronary Stent Intervention;  Surgeon: [name], MD;  Location: [place] [facility] [facility];  Service: Cardiovascular;  Laterality: N/A;  . CAROTID ENDARTERECTOMY  2002   "? side"  . CATARACT EXTRACTION W/ INTRAOCULAR LENS IMPLANT     "? side"  . CORONARY ANGIOPLASTY WITH STENT PLACEMENT  04/14/2017  . CORONARY ANGIOPLASTY WITH STENT PLACEMENT  11/03/2014   SVG PVA  . CORONARY ARTERY BYPASS GRAFT     with redo cabg  . CORONARY STENT INTERVENTION N/A 10/18/2019   Procedure: CORONARY STENT INTERVENTION;  Surgeon: [name], MD;  Location: [facility];  Service: Cardiovascular;  Laterality: N/A;  . CORONARY/GRAFT ANGIOGRAPHY N/A 10/18/2019   Procedure: CORONARY/GRAFT ANGIOGRAPHY;  Surgeon: [name], MD;  Location: [facility];  Service: Cardiovascular;  Laterality: N/A;  . EP IMPLANTABLE DEVICE N/A 09/13/2014   Procedure: PPM Generator Changeout;  Surgeon: Deboraha Sprang, MD;  Location: [facility];  Service: Cardiovascular;  Laterality: N/A;  . LEFT HEART CATH AND CORS/GRAFTS ANGIOGRAPHY N/A 04/14/2017   Procedure: LEFT HEART CATH AND CORS/GRAFTS ANGIOGRAPHY;  Surgeon: [name], MD;  Location: [facility];  Service: Cardiovascular;  Laterality: N/A;  . MOHS SURGERY     "  just outside my nose"  . PACEMAKER INSERTION  2007  . TONSILLECTOMY  1958    Family History  Problem Relation Age of Onset  . Heart attack Father        16s  . Heart disease Father        Heart Disease before age 61  . Cancer Mother     Social History   Socioeconomic History  . Marital status: Married    Spouse name: Not on file  . Number of children: Not on file  . Years of education: Not on file  . Highest education level: Not  on file  Occupational History  . Not on file  Tobacco Use  . Smoking status: Passive Smoke Exposure - Never Smoker  . Smokeless tobacco: Never Used  Vaping Use  . Vaping Use: Never used  Substance and Sexual Activity  . Alcohol use: Yes    Alcohol/week: 3.0 standard drinks    Types: 1 Glasses of wine, 1 Cans of beer, 1 Standard drinks or equivalent per week    Comment: \  . Drug use: No  . Sexual activity: Not Currently  Other Topics Concern  . Not on file  Social History Narrative  . Not on file   Social Determinants of Health   Financial Resource Strain:   . Difficulty of Paying Living Expenses: Not on file  Food Insecurity:   . Worried About Charity fundraiser in the Last Year: Not on file  . Ran Out of Food in the Last Year: Not on file  Transportation Needs:   . Lack of Transportation (Medical): Not on file  . Lack of Transportation (Non-Medical): Not on file  Physical Activity:   . Days of Exercise per Week: Not on file  . Minutes of Exercise per Session: Not on file  Stress:   . Feeling of Stress : Not on file  Social Connections:   . Frequency of Communication with Friends and Family: Not on file  . Frequency of Social Gatherings with Friends and Family: Not on file  . Attends Religious Services: Not on file  . Active Member of Clubs or Organizations: Not on file  . Attends Archivist Meetings: Not on file  . Marital Status: Not on file  Intimate Partner Violence:   . Fear of Current or Ex-Partner: Not on file  . Emotionally Abused: Not on file  . Physically Abused: Not on file  . Sexually Abused: Not on file    No Known Allergies  Current Outpatient Medications  Medication Sig Dispense Refill  . allopurinol (ZYLOPRIM) 100 MG tablet Take 100 mg by mouth daily.     Marland Kitchen amLODipine (NORVASC) 10 MG tablet Take 1 tablet (10 mg total) by mouth daily for 1 dose. 1 tablet 0  . aspirin EC 81 MG EC tablet Take 81 mg by mouth daily.  1 tablet 0  .  clopidogrel (PLAVIX) 75 MG tablet Take 75 mg by mouth daily.    . Evolocumab (REPATHA) 140 MG/ML SOSY Inject 140 mg into the skin every 14 (fourteen) days.     . fluticasone (FLONASE) 50 MCG/ACT nasal spray Place 2 sprays into both nostrils in the morning and at bedtime.    . furosemide (LASIX) 40 MG tablet Take 1 tablet (40 mg total) by mouth daily. 30 tablet   . isosorbide mononitrate (IMDUR) 120 MG 24 hr tablet Take 1 tablet (120 mg total) by mouth daily for 1 dose. 1 tablet 0  .  ketoconazole (NIZORAL) 2 % cream Apply 1 application topically daily as needed for irritation.    Marland Kitchen losartan (COZAAR) 100 MG tablet Take 1 tablet (100 mg total) by mouth daily for 1 dose. 1 tablet 0  . metoprolol succinate (TOPROL-XL) 50 MG 24 hr tablet Take 1 tablet (50 mg total) by mouth 2 (two) times daily. Take with or immediately following meals. 180 tablet 3  . montelukast (SINGULAIR) 10 MG tablet Take 10 mg by mouth daily.    Marland Kitchen NITROSTAT 0.4 MG SL tablet PLACE ONE TABLET UNDER THE TONGUE EVERY FIVE MINUTES AS NEEDED FOR CHEST PAIN 25 tablet 6  . ranolazine (RANEXA) 1000 MG SR tablet Take 1 tablet (1,000 mg total) by mouth 2 (two) times daily. 180 tablet 3  . tamsulosin (FLOMAX) 0.4 MG CAPS Take 0.4 mg by mouth 2 (two) times a day.      No current facility-administered medications for this visit.    REVIEW OF SYSTEMS:  [X]  denotes positive finding, [ ]  denotes negative finding Cardiac  Comments:  Chest pain or chest pressure:    Shortness of breath upon exertion:    Short of breath when lying flat:    Irregular heart rhythm:        Vascular    Pain in calf, thigh, or hip brought on by ambulation:    Pain in feet at night that wakes you up from your sleep:     Blood clot in your veins:    Leg swelling:         Pulmonary    Oxygen at home:    Productive cough:     Wheezing:         Neurologic    Sudden weakness in arms or legs:     Sudden numbness in arms or legs:     Sudden onset of difficulty  speaking or slurred speech:    Temporary loss of vision in one eye:     Problems with dizziness:         Gastrointestinal    Blood in stool:     Vomited blood:         Genitourinary    Burning when urinating:     Blood in urine:        Psychiatric    Major depression:         Hematologic    Bleeding problems:    Problems with blood clotting too easily:        Skin    Rashes or ulcers:        Constitutional    Fever or chills:     PHYSICAL EXAM:   Vitals:   02/24/20 1503 02/24/20 1506  BP: (!) 148/71 (!) 149/71  Pulse: 63   Resp: 20   Temp: 98 F (36.7 C)   TempSrc: Temporal   SpO2: 94%   Weight: 173 lb 9.6 oz (78.7 kg)   Height: 5\' 8"  (1.727 m)     Constitutional: Well appearing in no distress. Appears well nourished.  Neurologic: Normal gait and station. CN  intact.  No weakness.  No sensory loss. Psychiatric: Mood and affect symmetric and appropriate. Eyes: No icterus.  No conjunctival pallor. Ears, nose, throat: mucous membranes moist.  Midline trachea.   Cardiac: Regular rate and rhythm. Respiratory: Unlabored  Abdominal: soft, non-tender, non-distended.  Peripheral vascular:  Dorsalis pedis pulse: L 2+ / R 2+  Posterior tibial pulse: L 2+ / R 2+ Extremity: No edema.  No cyanosis.  No pallor.  Skin: No gangrene.  No ulceration.  Lymphatic: No Stemmer's sign.  No palpable lymphadenopathy.  DATA REVIEW:    Most recent CBC CBC Latest Ref Rng & Units 11/01/2019 10/19/2019 10/19/2019  WBC 3.4 - 10.8 x10E3/uL 5.2 5.2 4.4  Hemoglobin 13.0 - 17.7 g/dL 12.6(L) 13.1 12.8(L)  Hematocrit 37.5 - 51.0 % 38.3 39.8 38.7(L)  Platelets 150 - 450 x10E3/uL 165 161 160     Most recent CMP CMP Latest Ref Rng & Units 11/15/2019 11/01/2019 10/19/2019  Glucose 65 - 99 mg/dL 106(H) 100(H) 104(H)  BUN 8 - 27 mg/dL 48(H) 47(H) 34(H)  Creatinine 0.76 - 1.27 mg/dL 2.67(H) 2.09(H) 1.99(H)  Sodium 134 - 144 mmol/L 139 139 139  Potassium 3.5 - 5.2 mmol/L 5.1 5.2 4.5  Chloride 96 -  106 mmol/L 100 100 109  CO2 20 - 29 mmol/L 25 25 24   Calcium 8.6 - 10.2 mg/dL 9.0 9.5 8.5(L)  Total Protein 6.1 - 8.1 g/dL - - -  Total Bilirubin 0.2 - 1.2 mg/dL - - -  Alkaline Phos 40 - 115 U/L - - -  AST 10 - 35 U/L - - -  ALT 9 - 46 U/L - - -    Renal function CrCl cannot be calculated (Patient's most recent lab result is older than the maximum 21 days allowed.).  Vascular Imaging: ABI 02/24/20     I personally reviewed the above study. It is significant for no evidence of hemodynamically significant peripheral arterial disease.  Mild recurrent right carotid artery stenosis after endarterectomy consistent with progression of disease on the right and persistence of mild disease on the left.

## 2020-03-08 ENCOUNTER — Other Ambulatory Visit: Payer: Self-pay | Admitting: Cardiovascular Disease

## 2020-03-19 DIAGNOSIS — C44319 Basal cell carcinoma of skin of other parts of face: Secondary | ICD-10-CM | POA: Diagnosis not present

## 2020-03-19 DIAGNOSIS — L218 Other seborrheic dermatitis: Secondary | ICD-10-CM | POA: Diagnosis not present

## 2020-03-19 DIAGNOSIS — Z85828 Personal history of other malignant neoplasm of skin: Secondary | ICD-10-CM | POA: Diagnosis not present

## 2020-03-19 DIAGNOSIS — L821 Other seborrheic keratosis: Secondary | ICD-10-CM | POA: Diagnosis not present

## 2020-03-19 DIAGNOSIS — C44311 Basal cell carcinoma of skin of nose: Secondary | ICD-10-CM | POA: Diagnosis not present

## 2020-03-19 DIAGNOSIS — L570 Actinic keratosis: Secondary | ICD-10-CM | POA: Diagnosis not present

## 2020-03-19 DIAGNOSIS — D1801 Hemangioma of skin and subcutaneous tissue: Secondary | ICD-10-CM | POA: Diagnosis not present

## 2020-03-21 ENCOUNTER — Ambulatory Visit (INDEPENDENT_AMBULATORY_CARE_PROVIDER_SITE_OTHER): Payer: Medicare HMO

## 2020-03-21 DIAGNOSIS — N184 Chronic kidney disease, stage 4 (severe): Secondary | ICD-10-CM | POA: Diagnosis not present

## 2020-03-21 DIAGNOSIS — K219 Gastro-esophageal reflux disease without esophagitis: Secondary | ICD-10-CM | POA: Diagnosis not present

## 2020-03-21 DIAGNOSIS — I129 Hypertensive chronic kidney disease with stage 1 through stage 4 chronic kidney disease, or unspecified chronic kidney disease: Secondary | ICD-10-CM | POA: Diagnosis not present

## 2020-03-21 DIAGNOSIS — I442 Atrioventricular block, complete: Secondary | ICD-10-CM | POA: Diagnosis not present

## 2020-03-21 DIAGNOSIS — R059 Cough, unspecified: Secondary | ICD-10-CM | POA: Diagnosis not present

## 2020-03-23 LAB — CUP PACEART REMOTE DEVICE CHECK
Battery Impedance: 425 Ohm
Battery Remaining Longevity: 85 mo
Battery Voltage: 2.78 V
Brady Statistic AP VP Percent: 60 %
Brady Statistic AP VS Percent: 0 %
Brady Statistic AS VP Percent: 40 %
Brady Statistic AS VS Percent: 0 %
Date Time Interrogation Session: 20211124172611
Implantable Lead Implant Date: 20070831
Implantable Lead Implant Date: 20070831
Implantable Lead Location: 753859
Implantable Lead Location: 753860
Implantable Lead Model: 4469
Implantable Lead Model: 4470
Implantable Lead Serial Number: 485949
Implantable Lead Serial Number: 553665
Implantable Pulse Generator Implant Date: 20160518
Lead Channel Impedance Value: 459 Ohm
Lead Channel Impedance Value: 504 Ohm
Lead Channel Pacing Threshold Amplitude: 0.625 V
Lead Channel Pacing Threshold Amplitude: 0.75 V
Lead Channel Pacing Threshold Pulse Width: 0.4 ms
Lead Channel Pacing Threshold Pulse Width: 0.4 ms
Lead Channel Setting Pacing Amplitude: 2 V
Lead Channel Setting Pacing Amplitude: 2.5 V
Lead Channel Setting Pacing Pulse Width: 0.4 ms
Lead Channel Setting Sensing Sensitivity: 2 mV

## 2020-03-29 NOTE — Progress Notes (Signed)
Remote pacemaker transmission.   

## 2020-04-10 DIAGNOSIS — I129 Hypertensive chronic kidney disease with stage 1 through stage 4 chronic kidney disease, or unspecified chronic kidney disease: Secondary | ICD-10-CM | POA: Diagnosis not present

## 2020-04-10 DIAGNOSIS — N184 Chronic kidney disease, stage 4 (severe): Secondary | ICD-10-CM | POA: Diagnosis not present

## 2020-04-10 DIAGNOSIS — E785 Hyperlipidemia, unspecified: Secondary | ICD-10-CM | POA: Diagnosis not present

## 2020-04-10 DIAGNOSIS — E039 Hypothyroidism, unspecified: Secondary | ICD-10-CM | POA: Diagnosis not present

## 2020-04-10 DIAGNOSIS — K219 Gastro-esophageal reflux disease without esophagitis: Secondary | ICD-10-CM | POA: Diagnosis not present

## 2020-04-10 DIAGNOSIS — I6521 Occlusion and stenosis of right carotid artery: Secondary | ICD-10-CM | POA: Diagnosis not present

## 2020-04-10 DIAGNOSIS — I739 Peripheral vascular disease, unspecified: Secondary | ICD-10-CM | POA: Diagnosis not present

## 2020-04-10 DIAGNOSIS — I25118 Atherosclerotic heart disease of native coronary artery with other forms of angina pectoris: Secondary | ICD-10-CM | POA: Diagnosis not present

## 2020-04-10 DIAGNOSIS — I872 Venous insufficiency (chronic) (peripheral): Secondary | ICD-10-CM | POA: Diagnosis not present

## 2020-04-13 ENCOUNTER — Other Ambulatory Visit: Payer: Self-pay

## 2020-04-13 ENCOUNTER — Encounter: Payer: Self-pay | Admitting: Physician Assistant

## 2020-04-13 ENCOUNTER — Ambulatory Visit: Payer: Medicare HMO | Admitting: Physician Assistant

## 2020-04-13 VITALS — BP 136/82 | HR 63 | Ht 68.0 in | Wt 171.0 lb

## 2020-04-13 DIAGNOSIS — R5383 Other fatigue: Secondary | ICD-10-CM

## 2020-04-13 DIAGNOSIS — Z950 Presence of cardiac pacemaker: Secondary | ICD-10-CM | POA: Diagnosis not present

## 2020-04-13 DIAGNOSIS — I10 Essential (primary) hypertension: Secondary | ICD-10-CM

## 2020-04-13 DIAGNOSIS — I472 Ventricular tachycardia: Secondary | ICD-10-CM | POA: Diagnosis not present

## 2020-04-13 DIAGNOSIS — I2510 Atherosclerotic heart disease of native coronary artery without angina pectoris: Secondary | ICD-10-CM | POA: Diagnosis not present

## 2020-04-13 NOTE — Progress Notes (Addendum)
Cardiology Office Note Date:  04/13/2020  Patient ID:  Kyle, Avila May 01, 1945, MRN [id number] PCP:  [name], MD  Cardiologist:  Dr. Burt Knack Electrophysiologist: Dr. Caryl Comes    Chief Complaint: unusual fatigue w/skiing   History of Present Illness: Kyle Avila is a 74 y.o. male with history of CAD, chronic CHF (combined), PVD (carotid), CHB w/PPM, CKD, HTN, HLD  He last saw cardiology service w/S. Kathlen Mody, [place] Aug 2021 who nicely lays out his cardiac history  Coronary artery disease  ? S/p CABG in 1988; redo in 1996 ? S/p multiple PCI procedures (DES x 2 to LCx in 2009, DES to S-PDA in 2016, DES x 2 to S-PDA in 2018) ? S/p DES to RPDA and cutting balloon POBA to S-RPDA (ISR) 7/49  Combined systolic and diastolic CHF ? Ischemic CM ? Echocardiogram 2/18: EF 45-50 ? Echocardiogram 4/21: EF 45-50  Carotid artery disease ? S/p R CEA   He noted that he had seen Dr. Burt Knack in July with some ongoing angina since his PCI in June, though had mistakenly not been taking his amlodipine and once resumed doing better. Taking a s/l NTG prior to his walks with no anginal symptoms No changes were made, planned to see Dr. Burt Knack in 25mo.   He comes today to be seen for Dr. Caryl Comes, last seen by him briefly while he came in July 2021 for unusual fatigue, to the device clinic for pacer evaluation, device noted without abnormal findings, RN note mentions Dr. Caryl Comes saw him, no changes were recommended, at this time, had noted missed amlodipine, he was feeling better back on it.  Sent a message inquiring about his pacer function, reported while skiing he felt unusually exhausted in [place], sent a device transmission without abnormal findings. Comes for further evaluation   TODAY Since that day of skiing and since home he has felt quite well.  IN fact, says that he has not needed to take a single s/l NTG since November 1st.  He had not been skiing in 2 years and went to [place]  to ski with 2 others.  He mentions that usually their group is larger and the time between runs is longer with somewhat built in rest periods, though with a smaller group, minimal waiting time between runs.  He was not on the steepest of runs, but not small either, and somewhere between 7-8,042ft elevation. As we spoke, it did not seem that he was acutely/suddenly becoming tired or weak but as the day wore on, and by the time they were done he was "absolutely exhausted".  One of his friends even offered to carry his skis back to the lodge.  Outside of skiing they had been doing quite a bit of walking and was much more physically active the he has been in years He had no CP, no palpitations or cardiac awareness, and not even really SOB, just very tired.  Otherwise her is going quite well, goes to the gym, is active and denies any symptoms, no exertional intolerances, walks regularly. No near syncope or syncope. He has a chronic cough for years that he says no one has been able to fiugure out or cure. (I rechecked his O2 sat was 96-97% RA)  He is not perfect about his medicines, admits to occasionally missing some pills, this will include his asa or plavix.  Device information MDT dual chamber PPM implanted 2007, gen change 09/13/2014   Past Medical History:  Diagnosis Date  .  Arthritis    "some in my back" (04/14/2017)  . CAD (coronary artery disease)    a.  s/p CABG 1988 with redo in 1996 with multiple PCIs since then  . Carotid artery occlusion    s/p RCEA  . CKD (chronic kidney disease), stage IV ([facility])   . Complete heart block (HCC)    s/p PTVDP  . Gout   . HTN (hypertension)   . Hyperlipidemia   . 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 Mitral insufficiency   . Myocardial infarction ([facility])    "I've had some mild one" (04/14/2017)  . Presence of permanent cardiac pacemaker   . Skin cancer    "nose"  . VT (ventricular  tachycardia)-nonsustained 05/20/2011    Past Surgical History:  Procedure Laterality Date  . ACHILLES TENDON REPAIR Right   . CARDIAC CATHETERIZATION    . CARDIAC CATHETERIZATION N/A 11/03/2014   Procedure: Left Heart Cath and Cors/Grafts Angiography;  Surgeon: [name], MD;  Location: [facility];  Service: Cardiovascular;  Laterality: N/A;  . CARDIAC CATHETERIZATION N/A 11/03/2014   Procedure: Coronary Stent Intervention;  Surgeon: [name], MD;  Location: [facility];  Service: Cardiovascular;  Laterality: N/A;  . CAROTID ENDARTERECTOMY  2002   "? side"  . CATARACT EXTRACTION W/ INTRAOCULAR LENS IMPLANT     "? side"  . CORONARY ANGIOPLASTY WITH STENT PLACEMENT  04/14/2017  . CORONARY ANGIOPLASTY WITH STENT PLACEMENT  11/03/2014   SVG PVA  . CORONARY ARTERY BYPASS GRAFT     with redo cabg  . CORONARY STENT INTERVENTION N/A 10/18/2019   Procedure: CORONARY STENT INTERVENTION;  Surgeon: [name], MD;  Location: [facility];  Service: Cardiovascular;  Laterality: N/A;  . CORONARY/GRAFT ANGIOGRAPHY N/A 10/18/2019   Procedure: CORONARY/GRAFT ANGIOGRAPHY;  Surgeon: [name], MD;  Location: [facility];  Service: Cardiovascular;  Laterality: N/A;  . EP IMPLANTABLE DEVICE N/A 09/13/2014   Procedure: PPM Generator Changeout;  Surgeon: Deboraha Sprang, MD;  Location: [facility];  Service: Cardiovascular;  Laterality: N/A;  . LEFT HEART CATH AND CORS/GRAFTS ANGIOGRAPHY N/A 04/14/2017   Procedure: LEFT HEART CATH AND CORS/GRAFTS ANGIOGRAPHY;  Surgeon: [name], MD;  Location: [facility];  Service: Cardiovascular;  Laterality: N/A;  . MOHS SURGERY     "just outside my nose"  . PACEMAKER INSERTION  2007  . TONSILLECTOMY  1958    Current Outpatient Medications  Medication Sig Dispense Refill  . allopurinol (ZYLOPRIM) 100 MG tablet Take 100 mg by mouth daily.     Marland Kitchen amLODipine (NORVASC) 10 MG tablet Take 1 tablet (10 mg total)  by mouth daily. 90 tablet 2  . aspirin EC 81 MG EC tablet Take 81 mg by mouth daily.  1 tablet 0  . clopidogrel (PLAVIX) 75 MG tablet Take 75 mg by mouth daily.    . Evolocumab 140 MG/ML SOSY Inject 140 mg into the skin every 14 (fourteen) days.     . fluticasone (FLONASE) 50 MCG/ACT nasal spray Place 2 sprays into both nostrils in the morning and at bedtime.    . furosemide (LASIX) 40 MG tablet Take 1 tablet (40 mg total) by mouth daily. 30 tablet   . isosorbide mononitrate (IMDUR) 120 MG 24 hr tablet Take 120 mg by mouth daily.    Marland Kitchen ketoconazole (NIZORAL) 2 % cream Apply 1 application topically daily as needed for irritation.    Marland Kitchen  losartan (COZAAR) 100 MG tablet Take 100 mg by mouth daily.    . metoprolol succinate (TOPROL-XL) 50 MG 24 hr tablet Take 1 tablet (50 mg total) by mouth 2 (two) times daily. Take with or immediately following meals. 180 tablet 3  . montelukast (SINGULAIR) 10 MG tablet Take 10 mg by mouth daily.    Marland Kitchen NITROSTAT 0.4 MG SL tablet PLACE ONE TABLET UNDER THE TONGUE EVERY FIVE MINUTES AS NEEDED FOR CHEST PAIN 25 tablet 6  . pantoprazole (PROTONIX) 40 MG tablet Take 40 mg by mouth daily.    . ranolazine (RANEXA) 1000 MG SR tablet Take 1 tablet (1,000 mg total) by mouth 2 (two) times daily. 180 tablet 3  . tamsulosin (FLOMAX) 0.4 MG CAPS Take 0.4 mg by mouth daily.     No current facility-administered medications for this visit.    Allergies:   Patient has no known allergies.   Social History:  The patient  reports that he is a non-smoker but has been exposed to tobacco smoke. He has never used smokeless tobacco. He reports current alcohol use of about 3.0 standard drinks of alcohol per week. He reports that he does not use drugs.   Family History:  [name] family history includes Cancer in his mother; Heart attack in his father; Heart disease in his father.  ROS:  Please see the history of present illness.    All other systems are reviewed and otherwise negative.    PHYSICAL EXAM:  VS:  BP 136/82   Pulse 63   Ht 5\' 8"  (1.727 m)   Wt 171 lb (77.6 kg)   SpO2 97%   BMI 26.00 kg/m  BMI: Body mass index is 26 kg/m. Well nourished, well developed, in no acute distress HEENT: normocephalic, atraumatic Neck: no JVD, carotid bruits or masses Cardiac:  RRR; no significant murmurs, no rubs, or gallops Lungs:  CTA b/l, no wheezing, rhonchi or rales Abd: soft, nontender MS: no deformity or atrophy Ext: trace, slightly more edema, he reports at his baseline, chronic looking skin changes Skin: warm and dry, no rash Neuro:  No gross deficits appreciated Psych: euthymic mood, full affect  PPM site is stable, no tethering or discomfort   EKG:  Done today and reviewed by myself shows  AV paced, unchanged  Device interrogation done today and reviewed by myself:  Battery and lead measurements are good, no device observation AP 59% VP 99% HR histograms reviewed, noting rates to upper rate though predominantly 60-80   02/24/2020 LE arterial US Summary:  Right: Resting right ankle-brachial index is within normal range. No  evidence of significant right lower extremity arterial disease. The right  toe-brachial index is normal.   Left: Resting left ankle-brachial index is within normal range. No  evidence of significant left lower extremity arterial disease. The left  toe-brachial index is normal.    02/24/2020: carotid US Summary:  Right Carotid: Patent right endarterectomy site with velocity in the right  ICA         consistent with a 1-39% stenosis.   Left Carotid: Velocities in the left ICA are consistent with a 1-39%  stenosis.   Vertebrals: Bilateral vertebral arteries demonstrate antegrade flow.  Subclavians: Normal flow hemodynamics were seen in bilateral subclavian        arteries.     Cardiac catheterization 10/18/19 LM 100 LAD ost 100, mid 63 at anastomosis, dist 40 RI ost 100 LCx ost 20; OM1 100 RCA 100 CTO;  RPDA 95  S-RPDA prox stent patent w 10 ISR, dist stent 90 ISR S-OM1 100 CTO L-LAD patent  PCI:  2.5 x 12 mm Resolute Onyx DES to RPDA; cutting balloon POBA to S-RPDA     Echocardiogram 08/19/19 EF 45-50, Gr 2 DD, inf-lat HK, normal RVSF, RVSP 31.5, mod BAE, mild MR, trivial AI  Echo 06/24/2016 Mild concentric LVH, EF 45-50, inferolateral/inferior/inferoseptal hypokinesis, grade 1 diastolic dysfunction, mild MR, mild LAE, mildly reduced RVSF, mild TR, PASP 32  Myoview 02/2014 Intermediate risk stress nuclear study with a fixed medium-sized, severe basal to mid inferolateral and basal to mid anterolateral perfusion defect. This suggests prior infarction without significant ischemia. Study rated intermediate risk due to low EF at 42%. LV Ejection Fraction: 42%. LV Wall Motion: Lateral and inferior hypokinesis.   Carotid US 09/5463 R CEA patent LICA < 03%    Recent Labs: 11/01/2019: Hemoglobin 12.6; Platelets 165; TSH 5.720 11/15/2019: BUN 48; Creatinine, Ser 2.67; Potassium 5.1; Sodium 139  10/19/2019: VLDL 55 10/28/2019: Chol/HDL Ratio 3.2; Cholesterol, Total 133; HDL 41; LDL Chol Calc (NIH) 52; LDL Direct 47; Triglycerides 254   CrCl cannot be calculated (Patient's most recent lab result is older than the maximum 21 days allowed.).   Wt Readings from Last 3 Encounters:  04/13/20 171 lb (77.6 kg)  02/24/20 173 lb 9.6 oz (78.7 kg)  12/21/19 173 lb (78.5 kg)     Other studies reviewed: Additional studies/records reviewed today include: summarized above  ASSESSMENT AND PLAN:  1. PPM     Intact function  Initially suspect perhaps upper rate behavior with high exertional activity, though with further discussion I think he likely just over-did it and I decided not to increase his pupper tracking rate and advised the patient to pace himself  He had not been skiing in years, and was it elevation with that amount of exertion perhaps the culprit, more then anything else  2. CAD       He denies any angina, no use of his NTG in over a month     On ASA, plavix, BB, nitrate/ranexa, Repatha     sees Dr. Burt Knack in Feb  I have discussed with him the importance of not missing his medicines.  3. HTN     Looks ok, no changes  4. HLD     Not adressed today  5. PVD     Carotid US Oct 2021 looks OK     No bruits appreciated     Disposition: F/u with Dr. Burt Knack as scheduled, continue remotes Q 38mo and in clinic with EP in a year, sooner if needed.  Current medicines are reviewed at length with the patient today.  The patient did not have any concerns regarding medicines.  Venetia Night, PA-C 04/13/2020 5:46 PM     [address] [place] [address] [phone] (office)  [phone] (fax)

## 2020-04-13 NOTE — Patient Instructions (Signed)
Medication Instructions:   Your physician recommends that you continue on your current medications as directed. Please refer to the Current Medication list given to you today.  *If you need a refill on your cardiac medications before your next appointment, please call your pharmacy*   Lab Work: NONE ORDERED  TODAY   If you have labs (blood work) drawn today and your tests are completely normal, you will receive your results only by: . MyChart Message (if you have MyChart) OR . A paper copy in the mail If you have any lab test that is abnormal or we need to change your treatment, we will call you to review the results.   Testing/Procedures: NONE ORDERED  TODAY   Follow-Up: At [facility], you and your health needs are our priority.  As part of our continuing mission to provide you with exceptional heart care, we have created designated Provider Care Teams.  These Care Teams include your primary Cardiologist (physician) and Advanced Practice Providers (APPs -  Physician Assistants and Nurse Practitioners) who all work together to provide you with the care you need, when you need it.  We recommend signing up for the patient portal called "MyChart".  Sign up information is provided on this After Visit Summary.  MyChart is used to connect with patients for Virtual Visits (Telemedicine).  Patients are able to view lab/test results, encounter notes, upcoming appointments, etc.  Non-urgent messages can be sent to your provider as well.   To learn more about what you can do with MyChart, go to [email or web address].    Your next appointment:   1 year(s)  The format for your next appointment:   In Person  Provider:   Steven Klein, MD   Other Instructions   

## 2020-04-16 NOTE — Telephone Encounter (Signed)
RU notes reviewed   will follow

## 2020-05-29 ENCOUNTER — Other Ambulatory Visit: Payer: Self-pay | Admitting: Cardiovascular Disease

## 2020-05-29 ENCOUNTER — Telehealth: Payer: Self-pay | Admitting: Pharmacist

## 2020-05-29 MED ORDER — LEQVIO 284 MG/1.5ML ~~LOC~~ SOSY: 1.5000 mL | PREFILLED_SYRINGE | Freq: Once | SUBCUTANEOUS | 2 refills | Status: DC

## 2020-05-29 NOTE — Telephone Encounter (Addendum)
Prior auth submitted for Leqvio (see pt MyChart messages for details). Confirmed with pt that he makes too much to qualify for [employer] for [place].

## 2020-05-29 NOTE — Addendum Note (Signed)
Addended by: Anet Logsdon E on: 05/29/2020 10:35 AM   Modules accepted: Orders

## 2020-05-29 NOTE — Telephone Encounter (Signed)
I spoke with patient. He will stop by today to sign form to look into the cost of Leqvio for him. I discussed what we do know (effective at lowering LDL and good side effect profile) vs what we dont know (MACE data) about Leqvio. We will look into cost and patient can decide what he would like to do after that.

## 2020-05-30 NOTE — Addendum Note (Signed)
Addended by: Kamaury Cutbirth E on: 05/30/2020 09:09 AM   Modules accepted: Orders

## 2020-05-30 NOTE — Telephone Encounter (Signed)
Pt portal form faxed to [facility] for benefits investigation. Of note, I did complete a PA already which was approved (likely Tier 4 since med was non formulary).

## 2020-06-07 NOTE — Telephone Encounter (Signed)
Pt returned call to clinic. He prefers to stay on Repatha rather than change to [facility].

## 2020-06-07 NOTE — Telephone Encounter (Signed)
Left message for pt. Received message back regarding Leqvio coverage. "The patient has active coverage through [place]. Once OOP Max of has been met, LEQVIO is covered at 100%. The provider can [place] or use Psychologist, prison and probation services. The Patient does not have any out-of-network (OON) benefits. [name] estimated out-of-pocket cost is $285."  Anticipated out of pocket cost for Leqvio injection would be $285. With 3 injections given the first year, this breaks down to about $71/month. Can compare to see if this is cheaper than pt's current Repatha copay. Starting year 2 with only 2 injections given, this breaks down to about $47/month.

## 2020-06-08 DIAGNOSIS — N184 Chronic kidney disease, stage 4 (severe): Secondary | ICD-10-CM | POA: Diagnosis not present

## 2020-06-18 DIAGNOSIS — D696 Thrombocytopenia, unspecified: Secondary | ICD-10-CM | POA: Diagnosis not present

## 2020-06-18 DIAGNOSIS — N183 Chronic kidney disease, stage 3 unspecified: Secondary | ICD-10-CM | POA: Diagnosis not present

## 2020-06-18 DIAGNOSIS — E785 Hyperlipidemia, unspecified: Secondary | ICD-10-CM | POA: Diagnosis not present

## 2020-06-18 DIAGNOSIS — M109 Gout, unspecified: Secondary | ICD-10-CM | POA: Diagnosis not present

## 2020-06-18 DIAGNOSIS — I129 Hypertensive chronic kidney disease with stage 1 through stage 4 chronic kidney disease, or unspecified chronic kidney disease: Secondary | ICD-10-CM | POA: Diagnosis not present

## 2020-06-18 DIAGNOSIS — R809 Proteinuria, unspecified: Secondary | ICD-10-CM | POA: Diagnosis not present

## 2020-06-18 DIAGNOSIS — M329 Systemic lupus erythematosus, unspecified: Secondary | ICD-10-CM | POA: Diagnosis not present

## 2020-06-20 ENCOUNTER — Ambulatory Visit (INDEPENDENT_AMBULATORY_CARE_PROVIDER_SITE_OTHER): Payer: Medicare HMO

## 2020-06-20 DIAGNOSIS — I472 Ventricular tachycardia, unspecified: Secondary | ICD-10-CM

## 2020-06-21 DIAGNOSIS — R059 Cough, unspecified: Secondary | ICD-10-CM | POA: Diagnosis not present

## 2020-06-21 DIAGNOSIS — N184 Chronic kidney disease, stage 4 (severe): Secondary | ICD-10-CM | POA: Diagnosis not present

## 2020-06-21 DIAGNOSIS — K219 Gastro-esophageal reflux disease without esophagitis: Secondary | ICD-10-CM | POA: Diagnosis not present

## 2020-06-21 DIAGNOSIS — I25118 Atherosclerotic heart disease of native coronary artery with other forms of angina pectoris: Secondary | ICD-10-CM | POA: Diagnosis not present

## 2020-06-21 DIAGNOSIS — I129 Hypertensive chronic kidney disease with stage 1 through stage 4 chronic kidney disease, or unspecified chronic kidney disease: Secondary | ICD-10-CM | POA: Diagnosis not present

## 2020-06-21 LAB — CUP PACEART REMOTE DEVICE CHECK
Battery Impedance: 499 Ohm
Battery Remaining Longevity: 79 mo
Battery Voltage: 2.78 V
Brady Statistic AP VP Percent: 63 %
Brady Statistic AP VS Percent: 0 %
Brady Statistic AS VP Percent: 36 %
Brady Statistic AS VS Percent: 0 %
Date Time Interrogation Session: 20220223100222
Implantable Lead Implant Date: 20070831
Implantable Lead Implant Date: 20070831
Implantable Lead Location: 753859
Implantable Lead Location: 753860
Implantable Lead Model: 4469
Implantable Lead Model: 4470
Implantable Lead Serial Number: 485949
Implantable Lead Serial Number: 553665
Implantable Pulse Generator Implant Date: 20160518
Lead Channel Impedance Value: 467 Ohm
Lead Channel Impedance Value: 504 Ohm
Lead Channel Pacing Threshold Amplitude: 0.625 V
Lead Channel Pacing Threshold Amplitude: 0.625 V
Lead Channel Pacing Threshold Pulse Width: 0.4 ms
Lead Channel Pacing Threshold Pulse Width: 0.4 ms
Lead Channel Setting Pacing Amplitude: 2 V
Lead Channel Setting Pacing Amplitude: 2.5 V
Lead Channel Setting Pacing Pulse Width: 0.4 ms
Lead Channel Setting Sensing Sensitivity: 2 mV

## 2020-06-25 ENCOUNTER — Encounter: Payer: Self-pay | Admitting: Cardiovascular Disease

## 2020-06-25 ENCOUNTER — Other Ambulatory Visit: Payer: Self-pay

## 2020-06-25 ENCOUNTER — Ambulatory Visit: Payer: Medicare HMO | Admitting: Cardiovascular Disease

## 2020-06-25 VITALS — BP 130/80 | HR 63 | Ht 68.0 in | Wt 166.4 lb

## 2020-06-25 DIAGNOSIS — I25119 Atherosclerotic heart disease of native coronary artery with unspecified angina pectoris: Secondary | ICD-10-CM | POA: Diagnosis not present

## 2020-06-25 DIAGNOSIS — N184 Chronic kidney disease, stage 4 (severe): Secondary | ICD-10-CM | POA: Diagnosis not present

## 2020-06-25 DIAGNOSIS — I10 Essential (primary) hypertension: Secondary | ICD-10-CM | POA: Diagnosis not present

## 2020-06-25 DIAGNOSIS — I442 Atrioventricular block, complete: Secondary | ICD-10-CM | POA: Diagnosis not present

## 2020-06-25 DIAGNOSIS — E782 Mixed hyperlipidemia: Secondary | ICD-10-CM | POA: Diagnosis not present

## 2020-06-25 NOTE — Patient Instructions (Addendum)
Medication Instructions:  Your provider recommends that you continue on your current medications as directed. Please refer to the Current Medication list given to you today.   *If you need a refill on your cardiac medications before your next appointment, please call your pharmacy*   Follow-Up: At [facility], you and your health needs are our priority.  As part of our continuing mission to provide you with exceptional heart care, we have created designated Provider Care Teams.  These Care Teams include your primary Cardiologist (physician) and Advanced Practice Providers (APPs -  Physician Assistants and Nurse Practitioners) who all work together to provide you with the care you need, when you need it. Your next appointment:   4-6 month(s) The format for your next appointment:   In Person Provider:   Richardson Dopp, PA-C

## 2020-06-25 NOTE — Progress Notes (Signed)
Cardiology Office Note:    Date:  06/25/2020   ID:  FLYNT Avila, DOB 11/04/1945, MRN [id number]  PCP:  Kyle Avila, [place]  Cardiologist:  [name], MD  Advanced Practice Provider:  No care team member to display Electrophysiologist:  Kyle Axe, MD       Referring MD: Kyle Solian, MD   Chief Complaint  Patient presents with  . Chest Pain    History of Present Illness:    Kyle Avila is a 75 y.o. male with a hx of:  Coronary artery disease  ? S/p CABG in 1988; redo in 1996 ? S/p multiple PCI procedures (DES x 2 to LCx in 2009, DES to S-PDA in 2016, DES x 2 to S-PDA in 2018) ? S/p DES to RPDA and cutting balloon POBA to S-RPDA (FUX)3/23  Combined systolic and diastolic CHF ? Ischemic CM ? Echocardiogram 2/18: EF 45-50 ? Echocardiogram 4/21: EF 45-50  Carotid artery disease ? S/p R CEA   The patient is here alone today.  He went [place] at [employer] a few weeks ago.  He did not experience any significant angina while there.  However, he had marked fatigue and states that he felt "totally exhausted" with skiing this year.  He did not work out as much leading up to his ski trip.  He did not have significant exertional dyspnea.  He did experience angina over the weekend when he was on his tractor.  He states that it had him bouncing around a lot and he had some chest discomfort with this.  He otherwise has not had any recent problems with angina.  He has not had symptoms of chest pain or pressure with his routine physical activities or light exercise program.  He recently saw Dr. Marval Avila for nephrology follow-up and was scheduled to come back in 4 months.  Past Medical History:  Diagnosis Date  . Arthritis    "some in my back" (04/14/2017)  . CAD (coronary artery disease)    a.  s/p CABG 1988 with redo in 1996 with multiple PCIs since then  . Carotid artery occlusion    s/p RCEA  . CKD (chronic  kidney disease), stage IV ([place])   . Complete heart block (HCC)    s/p PTVDP  . Gout   . HTN (hypertension)   . Hyperlipidemia   . 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 Mitral insufficiency   . Myocardial infarction (Kyle Avila)    "I've had some mild one" (04/14/2017)  . Presence of permanent cardiac pacemaker   . Skin cancer    "nose"  . 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 ACHILLES TENDON REPAIR Right   . CARDIAC CATHETERIZATION    . CARDIAC CATHETERIZATION N/A 11/03/2014   Procedure: Left Heart Cath and Cors/Grafts Angiography;  Surgeon: [name], MD;  Location: [facility];  Service: Cardiovascular;  Laterality: N/A;  . CARDIAC CATHETERIZATION N/A 11/03/2014   Procedure: Coronary Stent Intervention;  Surgeon: [name], MD;  Location: [facility];  Service: Cardiovascular;  Laterality: N/A;  . CAROTID ENDARTERECTOMY  2002   "? side"  . CATARACT EXTRACTION W/ INTRAOCULAR LENS IMPLANT     "? side"  . CORONARY ANGIOPLASTY WITH STENT PLACEMENT  04/14/2017  . CORONARY ANGIOPLASTY WITH STENT PLACEMENT  11/03/2014  SVG PVA  . CORONARY ARTERY BYPASS GRAFT     with redo cabg  . CORONARY STENT INTERVENTION N/A 10/18/2019   Procedure: CORONARY STENT INTERVENTION;  Surgeon: [name], MD;  Location: [facility];  Service: Cardiovascular;  Laterality: N/A;  . CORONARY/GRAFT ANGIOGRAPHY N/A 10/18/2019   Procedure: CORONARY/GRAFT ANGIOGRAPHY;  Surgeon: [name], MD;  Location: [facility];  Service: Cardiovascular;  Laterality: N/A;  . EP IMPLANTABLE DEVICE N/A 09/13/2014   Procedure: PPM Generator Changeout;  Surgeon: Deboraha Sprang, MD;  Location: [facility];  Service: Cardiovascular;  Laterality: N/A;  . LEFT HEART CATH AND CORS/GRAFTS ANGIOGRAPHY N/A 04/14/2017   Procedure: LEFT HEART CATH AND CORS/GRAFTS ANGIOGRAPHY;   Surgeon: [name], MD;  Location: [facility];  Service: Cardiovascular;  Laterality: N/A;  . MOHS SURGERY     "just outside my nose"  . PACEMAKER INSERTION  2007  . TONSILLECTOMY  1958    Current Medications: Current Meds  Medication Sig  . allopurinol (ZYLOPRIM) 100 MG tablet Take 100 mg by mouth daily.   Marland Kitchen amLODipine (NORVASC) 10 MG tablet Take 1 tablet (10 mg total) by mouth daily.  Marland Kitchen aspirin EC 81 MG EC tablet Take 81 mg by mouth daily.   . clopidogrel (PLAVIX) 75 MG tablet Take 75 mg by mouth daily.  . Evolocumab 140 MG/ML SOSY Inject 140 mg into the skin every 14 (fourteen) days.   . fluticasone (FLONASE) 50 MCG/ACT nasal spray Place 2 sprays into both nostrils in the morning and at bedtime.  . furosemide (LASIX) 40 MG tablet Take 1 tablet (40 mg total) by mouth daily.  . isosorbide mononitrate (IMDUR) 120 MG 24 hr tablet Take 120 mg by mouth daily.  Marland Kitchen ketoconazole (NIZORAL) 2 % cream Apply 1 application topically daily as needed for irritation.  Marland Kitchen losartan (COZAAR) 100 MG tablet Take 100 mg by mouth daily.  . metoprolol succinate (TOPROL-XL) 50 MG 24 hr tablet Take 1 tablet (50 mg total) by mouth 2 (two) times daily. Take with or immediately following meals.  . montelukast (SINGULAIR) 10 MG tablet Take 10 mg by mouth daily.  Marland Kitchen NITROSTAT 0.4 MG SL tablet PLACE ONE TABLET UNDER THE TONGUE EVERY FIVE MINUTES AS NEEDED FOR CHEST PAIN  . pantoprazole (PROTONIX) 40 MG tablet Take 40 mg by mouth daily.  . ranolazine (RANEXA) 1000 MG SR tablet Take 1 tablet (1,000 mg total) by mouth 2 (two) times daily.  . tamsulosin (FLOMAX) 0.4 MG CAPS Take 0.4 mg by mouth daily.     Allergies:   Patient has no known allergies.   Social History   Socioeconomic History  . Marital status: Married    Spouse name: Not on file  . Number of children: Not on file  . Years of education: Not on file  . Highest education level: Not on file  Occupational History  . Not on file  Tobacco  Use  . Smoking status: Passive Smoke Exposure - Never Smoker  . Smokeless tobacco: Never Used  Vaping Use  . Vaping Use: Never used  Substance and Sexual Activity  . Alcohol use: Yes    Alcohol/week: 3.0 standard drinks    Types: 1 Glasses of wine, 1 Cans of beer, 1 Standard drinks or equivalent per week    Comment: \  . Drug use: No  . Sexual activity: Not Currently  Other Topics Concern  . Not on file  Social History Narrative  . Not  on file   Social Determinants of Health   Financial Resource Strain: Not on file  Food Insecurity: Not on file  Transportation Needs: Not on file  Physical Activity: Not on file  Stress: Not on file  Social Connections: Not on file     Family History: [name] family history includes Cancer in his mother; Heart attack in his father; Heart disease in his father.  ROS:   Please see the history of present illness.    All other systems reviewed and are negative.  EKGs/Labs/Other Studies Reviewed:    The following studies were reviewed today: Echo 08/19/2019: IMPRESSIONS    1. Left ventricular ejection fraction, by estimation, is 45 to 50%. The  left ventricle has mildly decreased function. The left ventricle has no  regional wall motion abnormalities. Left ventricular diastolic parameters  are consistent with Grade II  diastolic dysfunction (pseudonormalization). Elevated left atrial  pressure. There is severe hypokinesis of the left ventricular, basal-mid  inferolateral wall.  2. Right ventricular systolic function is normal. The right ventricular  size is mildly enlarged. There is mildly elevated pulmonary artery  systolic pressure. The estimated right ventricular systolic pressure is  30.0 mmHg.  3. Left atrial size was moderately dilated.  4. Right atrial size was moderately dilated.  5. The mitral valve is normal in structure. Mild mitral valve  regurgitation. No evidence of mitral stenosis.  6. The aortic valve is  normal in structure. Aortic valve regurgitation is  trivial. No aortic stenosis is present.  7. The inferior vena cava is normal in size with greater than 50%  respiratory variability, suggesting right atrial pressure of 3 mmHg.   Comparison(s): Prior images reviewed side by side. The left ventricular  function is unchanged. The left ventricular wall motion abnormality is  unchanged. There are more prominent signs of increased mean left at   Cardiac Catheterization 10/18/2019: Conclusion  1.  Severe native vessel coronary artery disease with total occlusion of the LAD and native RCA, continued patency of the stented segment in the proximal circumflex 2.  Continued patency of the LIMA to LAD with stable moderate stenosis of 70% at the LIMA anastomotic site unchanged from previous studies 3.  Extensive severe disease within the previously stented segment of the saphenous vein graft to PDA as well as severe de novo stenosis just beyond the distal anastomotic site in the PDA, both lesions treated with PCI.  Successful stenting of the PDA and successful angioplasty and scoring balloon angioplasty of the saphenous vein graft within the previously implanted stents.  Recommend: Long-term dual antiplatelet therapy with aspirin and clopidogrel.  Postprocedural hydration in this patient with significant kidney disease.  Repeat metabolic panel tomorrow morning.  Discharge tomorrow if no complications arise.  EKG:  EKG is ordered today.  The ekg ordered today demonstrates AV sequential pacing 63 bpm, occasional PVC  Recent Labs: 11/01/2019: Hemoglobin 12.6; Platelets 165; TSH 5.720 11/15/2019: BUN 48; Creatinine, Ser 2.67; Potassium 5.1; Sodium 139  Recent Lipid Panel    Component Value Date/Time   CHOL 133 10/28/2019 0820   TRIG 254 (H) 10/28/2019 0820   HDL 41 10/28/2019 0820   CHOLHDL 3.2 10/28/2019 0820   CHOLHDL 2.4 10/19/2019 0306   VLDL 55 (H) 10/19/2019 0306   LDLCALC 52 10/28/2019 0820    LDLDIRECT 47 10/28/2019 0820     Risk Assessment/Calculations:       Physical Exam:    VS:  BP 130/80   Pulse 63  Ht 5\' 8"  (1.727 m)   Wt 166 lb 6.4 oz (75.5 kg)   SpO2 97%   BMI 25.30 kg/m     Wt Readings from Last 3 Encounters:  06/25/20 166 lb 6.4 oz (75.5 kg)  04/13/20 171 lb (77.6 kg)  02/24/20 173 lb 9.6 oz (78.7 kg)     GEN:  Well nourished, well developed in no acute distress HEENT: Normal NECK: No JVD; No carotid bruits LYMPHATICS: No lymphadenopathy CARDIAC: RRR, no murmurs, rubs, gallops RESPIRATORY:  Clear to auscultation without rales, wheezing or rhonchi  ABDOMEN: Soft, non-tender, non-distended MUSCULOSKELETAL:  No edema; No deformity  SKIN: Warm and dry NEUROLOGIC:  Alert and oriented x 3 PSYCHIATRIC:  Normal affect   ASSESSMENT:    1. Coronary artery disease involving native coronary artery of native heart with angina pectoris ([place])   2. Complete heart block ([place])   3. Mixed hyperlipidemia   4. Essential hypertension   5. CKD (chronic kidney disease), stage IV (HCC)    PLAN:    In order of problems listed above:  1. Overall stable.  Antianginal program and includes amlodipine, isosorbide, metoprolol, and ranolazine.  He remains on long-term dual antiplatelet therapy with aspirin and clopidogrel.  If he develops progressive symptoms and certainly if cardiac catheterization is ever needed in the future, we will have to have careful discussion with nephrology regarding risk/benefit.  We discussed this today. 2. EKG shows paced rhythm.  Continue routine EP follow-up.  Reviewed his recent visit in December 2021. 3. Lipids at goal on [place].  Last LDL cholesterol 67.  LFTs within normal limits. 4. Blood pressure controlled as above. 5. Followed closely by nephrology.  Treated with losartan 100 mg daily.  I discussed avoidance of nephrotoxic drugs today.  Specifically we talked about the need to avoid any nonsteroidal anti-inflammatory drugs.  We also  discussed the importance of adequate water intake.        Medication Adjustments/Labs and Tests Ordered: Current medicines are reviewed at length with the patient today.  Concerns regarding medicines are outlined above.  No orders of the defined types were placed in this encounter.  No orders of the defined types were placed in this encounter.   There are no Patient Instructions on file for this visit.   Signed, [name], MD  06/25/2020 9:47 AM    [place]

## 2020-06-29 NOTE — Progress Notes (Signed)
Remote pacemaker transmission.   

## 2020-07-24 ENCOUNTER — Other Ambulatory Visit (HOSPITAL_COMMUNITY): Payer: Self-pay | Admitting: Physician Assistant

## 2020-07-24 DIAGNOSIS — M79641 Pain in right hand: Secondary | ICD-10-CM | POA: Diagnosis not present

## 2020-07-26 ENCOUNTER — Other Ambulatory Visit: Payer: Self-pay | Admitting: Cardiovascular Disease

## 2020-07-26 ENCOUNTER — Other Ambulatory Visit: Payer: Self-pay

## 2020-07-26 MED ORDER — NITROGLYCERIN 0.4 MG SL SUBL: SUBLINGUAL_TABLET | SUBLINGUAL | 2 refills | Status: DC

## 2020-07-28 ENCOUNTER — Other Ambulatory Visit (HOSPITAL_COMMUNITY): Payer: Self-pay

## 2020-07-28 MED FILL — Nitroglycerin SL Tab 0.4 MG: SUBLINGUAL | 25 days supply | Qty: 25 | Fill #0 | Status: CN

## 2020-07-29 ENCOUNTER — Other Ambulatory Visit (HOSPITAL_COMMUNITY): Payer: Self-pay

## 2020-07-29 MED FILL — Nitroglycerin SL Tab 0.4 MG: SUBLINGUAL | 8 days supply | Qty: 25 | Fill #0 | Status: CN

## 2020-07-30 ENCOUNTER — Other Ambulatory Visit (HOSPITAL_COMMUNITY): Payer: Self-pay

## 2020-07-30 MED FILL — Nitroglycerin SL Tab 0.4 MG: SUBLINGUAL | 25 days supply | Qty: 25 | Fill #0 | Status: CN

## 2020-08-06 DIAGNOSIS — H43811 Vitreous degeneration, right eye: Secondary | ICD-10-CM | POA: Diagnosis not present

## 2020-08-06 DIAGNOSIS — H31003 Unspecified chorioretinal scars, bilateral: Secondary | ICD-10-CM | POA: Diagnosis not present

## 2020-08-06 DIAGNOSIS — H31013 Macula scars of posterior pole (postinflammatory) (post-traumatic), bilateral: Secondary | ICD-10-CM | POA: Diagnosis not present

## 2020-08-06 DIAGNOSIS — Z961 Presence of intraocular lens: Secondary | ICD-10-CM | POA: Diagnosis not present

## 2020-09-10 ENCOUNTER — Other Ambulatory Visit (HOSPITAL_COMMUNITY): Payer: Self-pay

## 2020-09-10 MED ORDER — PREDNISONE 10 MG PO TABS
ORAL_TABLET | ORAL | 0 refills | Status: DC
  Filled 2020-09-10: qty 21, 6d supply, fill #0

## 2020-09-19 ENCOUNTER — Ambulatory Visit (INDEPENDENT_AMBULATORY_CARE_PROVIDER_SITE_OTHER): Payer: Medicare HMO

## 2020-09-19 DIAGNOSIS — I442 Atrioventricular block, complete: Secondary | ICD-10-CM | POA: Diagnosis not present

## 2020-09-24 LAB — CUP PACEART REMOTE DEVICE CHECK
Battery Impedance: 598 Ohm
Battery Remaining Longevity: 70 mo
Battery Voltage: 2.78 V
Brady Statistic AP VP Percent: 64 %
Brady Statistic AP VS Percent: 0 %
Brady Statistic AS VP Percent: 36 %
Brady Statistic AS VS Percent: 0 %
Date Time Interrogation Session: 20220530070023
Implantable Lead Implant Date: 20070831
Implantable Lead Implant Date: 20070831
Implantable Lead Location: 753859
Implantable Lead Location: 753860
Implantable Lead Model: 4469
Implantable Lead Model: 4470
Implantable Lead Serial Number: 485949
Implantable Lead Serial Number: 553665
Implantable Pulse Generator Implant Date: 20160518
Lead Channel Impedance Value: 451 Ohm
Lead Channel Impedance Value: 504 Ohm
Lead Channel Pacing Threshold Amplitude: 0.375 V
Lead Channel Pacing Threshold Amplitude: 0.625 V
Lead Channel Pacing Threshold Pulse Width: 0.4 ms
Lead Channel Pacing Threshold Pulse Width: 0.4 ms
Lead Channel Setting Pacing Amplitude: 2 V
Lead Channel Setting Pacing Amplitude: 2.5 V
Lead Channel Setting Pacing Pulse Width: 0.46 ms
Lead Channel Setting Sensing Sensitivity: 2 mV

## 2020-09-27 DIAGNOSIS — Z85828 Personal history of other malignant neoplasm of skin: Secondary | ICD-10-CM | POA: Diagnosis not present

## 2020-09-27 DIAGNOSIS — D1801 Hemangioma of skin and subcutaneous tissue: Secondary | ICD-10-CM | POA: Diagnosis not present

## 2020-09-27 DIAGNOSIS — L570 Actinic keratosis: Secondary | ICD-10-CM | POA: Diagnosis not present

## 2020-09-27 DIAGNOSIS — L821 Other seborrheic keratosis: Secondary | ICD-10-CM | POA: Diagnosis not present

## 2020-09-27 DIAGNOSIS — D225 Melanocytic nevi of trunk: Secondary | ICD-10-CM | POA: Diagnosis not present

## 2020-09-27 DIAGNOSIS — C44319 Basal cell carcinoma of skin of other parts of face: Secondary | ICD-10-CM | POA: Diagnosis not present

## 2020-09-27 DIAGNOSIS — D2261 Melanocytic nevi of right upper limb, including shoulder: Secondary | ICD-10-CM | POA: Diagnosis not present

## 2020-10-05 ENCOUNTER — Other Ambulatory Visit: Payer: Self-pay | Admitting: Cardiovascular Disease

## 2020-10-15 NOTE — Progress Notes (Signed)
Remote pacemaker transmission.   

## 2020-10-22 DIAGNOSIS — I2510 Atherosclerotic heart disease of native coronary artery without angina pectoris: Secondary | ICD-10-CM | POA: Diagnosis not present

## 2020-10-22 DIAGNOSIS — R809 Proteinuria, unspecified: Secondary | ICD-10-CM | POA: Diagnosis not present

## 2020-10-22 DIAGNOSIS — N184 Chronic kidney disease, stage 4 (severe): Secondary | ICD-10-CM | POA: Diagnosis not present

## 2020-10-22 DIAGNOSIS — I129 Hypertensive chronic kidney disease with stage 1 through stage 4 chronic kidney disease, or unspecified chronic kidney disease: Secondary | ICD-10-CM | POA: Diagnosis not present

## 2020-10-22 DIAGNOSIS — E875 Hyperkalemia: Secondary | ICD-10-CM | POA: Diagnosis not present

## 2020-10-23 NOTE — Progress Notes (Addendum)
[facility] Office Note:    Date:  10/24/2020   ID:  Kyle Avila, DOB 1945/09/02, MRN [id number]  PCP:  [name], MD   [facility] Providers Cardiologist:  [name], MD Electrophysiologist:  [name], MD      Referring MD: [name], MD   Chief Complaint:  Follow-up (CAD)    Patient Profile:    Kyle Avila is a 75 y.o. 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 Echocardiogram 2/18: EF 45-50 Echocardiogram 4/21: EF 45-50 Carotid artery disease S/p R CEA CHB, s/p pacemaker Chronic kidney disease Hypertension Hyperlipidemia    Prior CV studies: Carotid US 02/24/20 Bilateral ICA 1-39  Cardiac catheterization 10/18/19 LM 100 LAD ost 100, mid 45 at anastomosis, dist 16 RI ost 100 LCx ost 20; OM1 100 RCA 100 CTO; RPDA 95 S-RPDA prox stent patent w 10 ISR, dist stent 90 ISR S-OM1 100 CTO L-LAD patent PCI:  2.5 x 12 mm Resolute Onyx DES to RPDA; cutting balloon POBA to S-RPDA     Echocardiogram 08/19/19 EF 45-50, Gr 2 DD, inf-lat HK, normal RVSF, RVSP 31.5, mod BAE, mild MR, trivial AI   Echo 06/24/2016 Mild concentric LVH, EF 45-50, inferolateral/inferior/inferoseptal hypokinesis, grade 1 diastolic dysfunction, mild MR, mild LAE, mildly reduced RVSF, mild TR, PASP 32   Myoview 02/2014 Intermediate risk stress nuclear study with a fixed medium-sized, severe basal to mid inferolateral and basal to mid anterolateral perfusion defect.  This suggests prior infarction without significant ischemia.  Study rated intermediate risk due to low EF at 42%. LV Ejection Fraction: 42%.  LV Wall Motion:  Lateral and inferior hypokinesis.   Carotid US 07/1322 R CEA patent LICA < 40%   History of Present  Illness: Kyle Avila was last seen by Dr. Burt Knack in 2/22.  He returns for f/u.  Overall, his angina continues to be stable.  He typically takes nitroglycerin prior to exercise.  He rarely has to take nitroglycerin during exercise.  However, he finds his angina quite annoying.  He had significant exhaustion this past ski season and could not ski as much as he would like.  He has not had significant shortness of breath.  He has not had associated nausea or diaphoresis.  He has not had syncope, orthopnea.  He has mild pedal edema without significant change.        Past Medical History:  Diagnosis Date   Arthritis    "some in my back" (04/14/2017)   CAD (coronary artery disease)    a.  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    amLODipine (NORVASC) 10 MG tablet Take 1 tablet (10 mg total) by mouth daily.   aspirin EC 81 MG EC tablet Take 81 mg by mouth daily.    clopidogrel (PLAVIX) 75 MG tablet Take 75 mg by mouth daily.   Evolocumab 140 MG/ML SOSY Inject 140 mg into the skin every 14 (fourteen) days.    fluticasone (FLONASE) 50 MCG/ACT nasal spray Place 2 sprays into both nostrils in the morning and at bedtime.   furosemide (LASIX) 40 MG tablet Take 1 tablet (40 mg total) by mouth daily.   isosorbide mononitrate (IMDUR) 120 MG 24 hr tablet Take 120 mg by mouth daily.    isosorbide mononitrate (IMDUR) 30 MG 24 hr tablet Take one tablet by mouth ( 30 mg) daily with your (120 mg) tablet.   ketoconazole (NIZORAL) 2 % cream Apply 1 application topically daily as needed for irritation.   losartan (COZAAR) 100 MG tablet Take 1 tablet (100 mg total) by mouth daily.   metoprolol succinate (TOPROL-XL) 50 MG 24 hr tablet Take 1 tablet (50 mg total) by mouth 2 (two) times daily. Take with or immediately following meals.   nitroGLYCERIN (NITROSTAT) 0.4 MG SL tablet PLACE 1 TABLET UNDER THE TONGUE EVERY 5 MINUTES AS NEEDED FOR CHEST PAIN   predniSONE (DELTASONE) 10 MG tablet Take 6 tablets by mouth on day 1 and decrease by 1 tablet each day. 6,5,4,3,2,1   ranolazine (RANEXA) 1000 MG SR tablet Take 1 tablet (1,000 mg total) by mouth 2 (two) times daily.   tamsulosin (FLOMAX) 0.4 MG CAPS Take 0.4 mg by mouth daily.     Allergies:   Patient has no known allergies.   Social History   Tobacco Use   Smoking status: Never    Passive exposure: Yes   Smokeless tobacco: Never  Vaping Use   Vaping Use: Never used  Substance Use Topics   Alcohol use: Yes    Alcohol/week: 3.0 standard drinks    Types: 1 Glasses of wine, 1 Cans of beer, 1 Standard drinks or equivalent per week    Comment: \   Drug use: No     Family Hx: [name] family history includes Cancer in his mother; Heart attack in his father; Heart disease in his father.  Review of Systems  Constitutional: Negative for fever.  Cardiovascular:  Negative for claudication.  Respiratory:  Positive for cough (seasonal; takes prednisone).   Gastrointestinal:  Negative for hematochezia and melena.  Genitourinary:  Negative for hematuria.    EKGs/Labs/Other Test Reviewed:    EKG:  EKG is   ordered today.  The ekg ordered today demonstrates AV paced, HR 61  Recent Labs: 11/01/2019: Hemoglobin 12.6; Platelets 165; TSH 5.720 11/15/2019: BUN 48; Creatinine, Ser 2.67; Potassium 5.1; Sodium 139   Recent Lipid  Panel Lab Results  Component Value Date/Time   CHOL 133 10/28/2019 08:20 AM   TRIG 254 (H) 10/28/2019 08:20 AM   HDL 41 10/28/2019 08:20 AM   LDLCALC 52 10/28/2019 08:20 AM   LDLDIRECT 47 10/28/2019 08:20 AM      Risk Assessment/Calculations:      Physical Exam:    VS:  BP 110/60   Pulse 61   Ht 5\' 8"  (1.727 m)   Wt 167 lb 9.6 oz (76 kg)   SpO2 98%   BMI 25.48 kg/m  Wt Readings from Last 3 Encounters:  10/24/20 167 lb 9.6 oz (76 kg)  06/25/20 166 lb 6.4 oz (75.5 kg)  04/13/20 171 lb (77.6 kg)     Constitutional:      Appearance: Healthy appearance. Not in distress.  Neck:     Vascular: JVD normal.  Pulmonary:     Effort: Pulmonary effort is normal.     Breath sounds: No wheezing. No rales.  Cardiovascular:     Normal rate. Regular rhythm. Normal S1. Normal S2.      Murmurs: There is a grade 1/6 systolic murmur at the URSB.  Edema:    Ankle: bilateral trace edema of the ankle. Abdominal:     Palpations: Abdomen is soft. There is no hepatomegaly.  Skin:    General: Skin is warm and dry.  Neurological:     General: No focal deficit present.     Mental Status: Alert and oriented to person, place and time.     Cranial Nerves: Cranial nerves are intact.        ASSESSMENT & PLAN:    1. Coronary artery disease involving native coronary artery of native heart with angina pectoris ([place]) History of bypass in 1998 and redo bypass in 1996 as well as multiple percutaneous coronary interventions since then.  His most recent PCI was in June 2021 with a drug-eluting stent to the RPDA and balloon angioplasty to the vein graft to the RPDA.  He continues to have angina with certain types of activity.  Overall, his symptoms are stable.  However, they are quite disturbing for him.  He is discouraged that he could not ski as much as he would like this past season.  I reviewed with him the risks of proceeding with cardiac catheterization.  He would be high risk for  contrast-induced nephropathy which could worsen his kidney function and lead to dialysis.  He understands this.  At this point, I have recommended that we continue to try medical therapy.  He seems to have some improvement with nitroglycerin.  Therefore, I will increase his isosorbide to 150 mg daily.  Continue current dose of amlodipine, metoprolol, ranolazine, aspirin, clopidogrel, Alirocumab.  I did review further with Dr. Burt Knack who agrees we should hold off on catheterization unless he has crescendo angina.  Follow-up with Dr. Burt Knack in 4 to 6 months.  2. Chronic combined systolic and diastolic CHF (congestive heart failure) (HCC) EF 45-50.  Ischemic cardiomyopathy.  NYHA II.  Volume status is stable.  Continue ARB, beta-blocker.  He is not a candidate for spironolactone given chronic kidney disease.  He has had a history of hyperkalemia in the past.  3. CKD (chronic kidney disease), stage IV ([place]) Followed by Dr. Arty Baumgartner with [facility]  4. Essential hypertension [name] blood pressure is controlled on his current regimen.  Continue current therapy.  Request most recent BMET from [facility].  5. Mixed hyperlipidemia LDL optimal on most recent lab work.  Continue current Rx.     6. Carotid artery disease  S/p right CEA.  Arrange follow-up carotid US.  Dispo:  Return in about 6 months (around 04/25/2021) for Routine Follow Up, w/ Dr. Burt Knack.   Medication Adjustments/Labs and Tests Ordered: Current medicines are reviewed at length with the patient today.  Concerns regarding medicines are outlined above.  Tests Ordered: Orders Placed This Encounter  Procedures   EKG 12-Lead   VAS US CAROTID   Medication Changes: Meds ordered this encounter  Medications  isosorbide mononitrate (IMDUR) 30 MG 24 hr tablet    Sig: Take one tablet by mouth ( 30 mg) daily with your (120 mg) tablet.    Dispense:  90 tablet    Refill:  3    Signed, Richardson Dopp, PA-C   10/24/2020 10:16 AM    [address], [place], [place]  [address] Phone: [phone]; Fax: [phone]

## 2020-10-24 ENCOUNTER — Encounter: Payer: Self-pay | Admitting: Physician Assistant

## 2020-10-24 ENCOUNTER — Ambulatory Visit: Payer: Medicare HMO | Admitting: Physician Assistant

## 2020-10-24 ENCOUNTER — Other Ambulatory Visit: Payer: Self-pay

## 2020-10-24 VITALS — BP 110/60 | HR 61 | Ht 68.0 in | Wt 167.6 lb

## 2020-10-24 DIAGNOSIS — E782 Mixed hyperlipidemia: Secondary | ICD-10-CM

## 2020-10-24 DIAGNOSIS — I5042 Chronic combined systolic (congestive) and diastolic (congestive) heart failure: Secondary | ICD-10-CM | POA: Diagnosis not present

## 2020-10-24 DIAGNOSIS — N184 Chronic kidney disease, stage 4 (severe): Secondary | ICD-10-CM

## 2020-10-24 DIAGNOSIS — I25119 Atherosclerotic heart disease of native coronary artery with unspecified angina pectoris: Secondary | ICD-10-CM | POA: Diagnosis not present

## 2020-10-24 DIAGNOSIS — I6523 Occlusion and stenosis of bilateral carotid arteries: Secondary | ICD-10-CM | POA: Diagnosis not present

## 2020-10-24 DIAGNOSIS — I10 Essential (primary) hypertension: Secondary | ICD-10-CM

## 2020-10-24 MED ORDER — ISOSORBIDE MONONITRATE ER 30 MG PO TB24: ORAL_TABLET | ORAL | 3 refills | Status: DC

## 2020-10-24 NOTE — Patient Instructions (Addendum)
Medication Instructions:  Your physician has recommended you make the following change in your medication:   CHANGE Imdur to one tablet by mouth ( 30 mg) to be taken with one tablet my mouth ( 120 mg) tablet daily.    *If you need a refill on your cardiac medications before your next appointment, please call your pharmacy*   Lab Work:  -NONE  If you have labs (blood work) drawn today and your tests are completely normal, you will receive your results only by: [place] (if you have MyChart) OR A paper copy in the mail If you have any lab test that is abnormal or we need to change your treatment, we will call you to review the results.   Testing/Procedures: Your physician has requested that you have a carotid duplex. This test is an ultrasound of the carotid arteries in your neck. It looks at blood flow through these arteries that supply the brain with blood. Allow one hour for this exam. There are no restrictions or special instructions.    Follow-Up: At [facility], you and your health needs are our priority.  As part of our continuing mission to provide you with exceptional heart care, we have created designated Provider Care Teams.  These Care Teams include your primary Cardiologist (physician) and Advanced Practice Providers (APPs -  Physician Assistants and Nurse Practitioners) who all work together to provide you with the care you need, when you need it.  We recommend signing up for the patient portal called "MyChart".  Sign up information is provided on this After Visit Summary.  MyChart is used to connect with patients for Virtual Visits (Telemedicine).  Patients are able to view lab/test results, encounter notes, upcoming appointments, etc.  Non-urgent messages can be sent to your provider as well.   To learn more about what you can do with MyChart, go to [email or web address].    Your next appointment:   6 month(s) with Dr. Burt Knack on Wednesday, December 7 @ 3:00  pm.  The format for your next appointment:   In Person  Provider:   [name], MD   Other Instructions  Requested labs from [facility].

## 2020-10-25 ENCOUNTER — Other Ambulatory Visit (HOSPITAL_COMMUNITY): Payer: Self-pay | Admitting: Physician Assistant

## 2020-10-25 ENCOUNTER — Ambulatory Visit (HOSPITAL_COMMUNITY)
Admission: RE | Admit: 2020-10-25 | Discharge: 2020-10-25 | Disposition: A | Discharge status: Home / Self Care | Payer: Medicare HMO | Source: Ambulatory Visit | Attending: Cardiovascular Disease | Admitting: Cardiovascular Disease

## 2020-10-25 ENCOUNTER — Other Ambulatory Visit: Payer: Self-pay | Admitting: Physician Assistant

## 2020-10-25 DIAGNOSIS — I10 Essential (primary) hypertension: Secondary | ICD-10-CM

## 2020-10-25 DIAGNOSIS — E875 Hyperkalemia: Secondary | ICD-10-CM | POA: Diagnosis not present

## 2020-10-25 DIAGNOSIS — I25119 Atherosclerotic heart disease of native coronary artery with unspecified angina pectoris: Secondary | ICD-10-CM | POA: Diagnosis not present

## 2020-10-25 DIAGNOSIS — Z9889 Other specified postprocedural states: Secondary | ICD-10-CM | POA: Diagnosis not present

## 2020-10-25 DIAGNOSIS — E782 Mixed hyperlipidemia: Secondary | ICD-10-CM

## 2020-10-25 DIAGNOSIS — I6523 Occlusion and stenosis of bilateral carotid arteries: Secondary | ICD-10-CM

## 2020-10-25 DIAGNOSIS — I5042 Chronic combined systolic (congestive) and diastolic (congestive) heart failure: Secondary | ICD-10-CM | POA: Diagnosis not present

## 2020-10-25 DIAGNOSIS — N184 Chronic kidney disease, stage 4 (severe): Secondary | ICD-10-CM | POA: Diagnosis not present

## 2020-11-14 ENCOUNTER — Other Ambulatory Visit: Payer: Self-pay | Admitting: Cardiovascular Disease

## 2020-11-19 DIAGNOSIS — B351 Tinea unguium: Secondary | ICD-10-CM | POA: Diagnosis not present

## 2020-11-27 DIAGNOSIS — M109 Gout, unspecified: Secondary | ICD-10-CM | POA: Diagnosis not present

## 2020-11-27 DIAGNOSIS — E785 Hyperlipidemia, unspecified: Secondary | ICD-10-CM | POA: Diagnosis not present

## 2020-11-27 DIAGNOSIS — Z125 Encounter for screening for malignant neoplasm of prostate: Secondary | ICD-10-CM | POA: Diagnosis not present

## 2020-11-27 DIAGNOSIS — E039 Hypothyroidism, unspecified: Secondary | ICD-10-CM | POA: Diagnosis not present

## 2020-12-03 DIAGNOSIS — R82998 Other abnormal findings in urine: Secondary | ICD-10-CM | POA: Diagnosis not present

## 2020-12-04 DIAGNOSIS — I739 Peripheral vascular disease, unspecified: Secondary | ICD-10-CM | POA: Diagnosis not present

## 2020-12-04 DIAGNOSIS — R059 Cough, unspecified: Secondary | ICD-10-CM | POA: Diagnosis not present

## 2020-12-04 DIAGNOSIS — I6521 Occlusion and stenosis of right carotid artery: Secondary | ICD-10-CM | POA: Diagnosis not present

## 2020-12-04 DIAGNOSIS — N184 Chronic kidney disease, stage 4 (severe): Secondary | ICD-10-CM | POA: Diagnosis not present

## 2020-12-04 DIAGNOSIS — E039 Hypothyroidism, unspecified: Secondary | ICD-10-CM | POA: Diagnosis not present

## 2020-12-04 DIAGNOSIS — I25118 Atherosclerotic heart disease of native coronary artery with other forms of angina pectoris: Secondary | ICD-10-CM | POA: Diagnosis not present

## 2020-12-04 DIAGNOSIS — E785 Hyperlipidemia, unspecified: Secondary | ICD-10-CM | POA: Diagnosis not present

## 2020-12-04 DIAGNOSIS — Z0000 Encounter for general adult medical examination without abnormal findings: Secondary | ICD-10-CM | POA: Diagnosis not present

## 2020-12-04 DIAGNOSIS — I129 Hypertensive chronic kidney disease with stage 1 through stage 4 chronic kidney disease, or unspecified chronic kidney disease: Secondary | ICD-10-CM | POA: Diagnosis not present

## 2020-12-07 DIAGNOSIS — B351 Tinea unguium: Secondary | ICD-10-CM | POA: Diagnosis not present

## 2020-12-07 DIAGNOSIS — Z85828 Personal history of other malignant neoplasm of skin: Secondary | ICD-10-CM | POA: Diagnosis not present

## 2020-12-19 ENCOUNTER — Ambulatory Visit (INDEPENDENT_AMBULATORY_CARE_PROVIDER_SITE_OTHER): Payer: Medicare HMO

## 2020-12-19 DIAGNOSIS — I442 Atrioventricular block, complete: Secondary | ICD-10-CM | POA: Diagnosis not present

## 2020-12-20 LAB — CUP PACEART REMOTE DEVICE CHECK
Battery Impedance: 673 Ohm
Battery Remaining Longevity: 68 mo
Battery Voltage: 2.78 V
Brady Statistic AP VP Percent: 61 %
Brady Statistic AP VS Percent: 0 %
Brady Statistic AS VP Percent: 39 %
Brady Statistic AS VS Percent: 0 %
Date Time Interrogation Session: 20220825110619
Implantable Lead Implant Date: 20070831
Implantable Lead Implant Date: 20070831
Implantable Lead Location: 753859
Implantable Lead Location: 753860
Implantable Lead Model: 4469
Implantable Lead Model: 4470
Implantable Lead Serial Number: 485949
Implantable Lead Serial Number: 553665
Implantable Pulse Generator Implant Date: 20160518
Lead Channel Impedance Value: 451 Ohm
Lead Channel Impedance Value: 482 Ohm
Lead Channel Pacing Threshold Amplitude: 0.5 V
Lead Channel Pacing Threshold Amplitude: 0.625 V
Lead Channel Pacing Threshold Pulse Width: 0.4 ms
Lead Channel Pacing Threshold Pulse Width: 0.4 ms
Lead Channel Setting Pacing Amplitude: 2 V
Lead Channel Setting Pacing Amplitude: 2.5 V
Lead Channel Setting Pacing Pulse Width: 0.4 ms
Lead Channel Setting Sensing Sensitivity: 2 mV

## 2021-01-03 NOTE — Progress Notes (Signed)
Remote pacemaker transmission.   

## 2021-01-22 DIAGNOSIS — R972 Elevated prostate specific antigen [PSA]: Secondary | ICD-10-CM | POA: Diagnosis not present

## 2021-02-06 DIAGNOSIS — E875 Hyperkalemia: Secondary | ICD-10-CM | POA: Diagnosis not present

## 2021-02-06 DIAGNOSIS — I129 Hypertensive chronic kidney disease with stage 1 through stage 4 chronic kidney disease, or unspecified chronic kidney disease: Secondary | ICD-10-CM | POA: Diagnosis not present

## 2021-02-06 DIAGNOSIS — R809 Proteinuria, unspecified: Secondary | ICD-10-CM | POA: Diagnosis not present

## 2021-02-06 DIAGNOSIS — I2510 Atherosclerotic heart disease of native coronary artery without angina pectoris: Secondary | ICD-10-CM | POA: Diagnosis not present

## 2021-02-06 DIAGNOSIS — N184 Chronic kidney disease, stage 4 (severe): Secondary | ICD-10-CM | POA: Diagnosis not present

## 2021-03-06 DIAGNOSIS — M25562 Pain in left knee: Secondary | ICD-10-CM | POA: Diagnosis not present

## 2021-03-08 MED ORDER — NITROGLYCERIN 0.4 MG SL SUBL: SUBLINGUAL_TABLET | SUBLINGUAL | 3 refills | Status: DC

## 2021-03-20 ENCOUNTER — Ambulatory Visit (INDEPENDENT_AMBULATORY_CARE_PROVIDER_SITE_OTHER): Payer: Medicare HMO

## 2021-03-20 DIAGNOSIS — I442 Atrioventricular block, complete: Secondary | ICD-10-CM

## 2021-03-24 LAB — CUP PACEART REMOTE DEVICE CHECK
Battery Impedance: 698 Ohm
Battery Remaining Longevity: 67 mo
Battery Voltage: 2.78 V
Brady Statistic AP VP Percent: 60 %
Brady Statistic AP VS Percent: 0 %
Brady Statistic AS VP Percent: 40 %
Brady Statistic AS VS Percent: 0 %
Date Time Interrogation Session: 20221123140355
Implantable Lead Implant Date: 20070831
Implantable Lead Implant Date: 20070831
Implantable Lead Location: 753859
Implantable Lead Location: 753860
Implantable Lead Model: 4469
Implantable Lead Model: 4470
Implantable Lead Serial Number: 485949
Implantable Lead Serial Number: 553665
Implantable Pulse Generator Implant Date: 20160518
Lead Channel Impedance Value: 451 Ohm
Lead Channel Impedance Value: 489 Ohm
Lead Channel Pacing Threshold Amplitude: 0.625 V
Lead Channel Pacing Threshold Amplitude: 0.75 V
Lead Channel Pacing Threshold Pulse Width: 0.4 ms
Lead Channel Pacing Threshold Pulse Width: 0.4 ms
Lead Channel Setting Pacing Amplitude: 2 V
Lead Channel Setting Pacing Amplitude: 2.5 V
Lead Channel Setting Pacing Pulse Width: 0.4 ms
Lead Channel Setting Sensing Sensitivity: 2 mV

## 2021-03-25 ENCOUNTER — Encounter: Payer: Self-pay | Admitting: Cardiovascular Disease

## 2021-03-25 ENCOUNTER — Ambulatory Visit: Payer: Medicare HMO | Admitting: Cardiovascular Disease

## 2021-03-25 ENCOUNTER — Other Ambulatory Visit: Payer: Self-pay

## 2021-03-25 VITALS — BP 110/70 | HR 66 | Ht 68.0 in | Wt 167.6 lb

## 2021-03-25 DIAGNOSIS — N184 Chronic kidney disease, stage 4 (severe): Secondary | ICD-10-CM

## 2021-03-25 DIAGNOSIS — E782 Mixed hyperlipidemia: Secondary | ICD-10-CM

## 2021-03-25 DIAGNOSIS — I6523 Occlusion and stenosis of bilateral carotid arteries: Secondary | ICD-10-CM

## 2021-03-25 DIAGNOSIS — D692 Other nonthrombocytopenic purpura: Secondary | ICD-10-CM | POA: Diagnosis not present

## 2021-03-25 DIAGNOSIS — I10 Essential (primary) hypertension: Secondary | ICD-10-CM | POA: Diagnosis not present

## 2021-03-25 DIAGNOSIS — L570 Actinic keratosis: Secondary | ICD-10-CM | POA: Diagnosis not present

## 2021-03-25 DIAGNOSIS — L814 Other melanin hyperpigmentation: Secondary | ICD-10-CM | POA: Diagnosis not present

## 2021-03-25 DIAGNOSIS — I25119 Atherosclerotic heart disease of native coronary artery with unspecified angina pectoris: Secondary | ICD-10-CM

## 2021-03-25 DIAGNOSIS — D1801 Hemangioma of skin and subcutaneous tissue: Secondary | ICD-10-CM | POA: Diagnosis not present

## 2021-03-25 DIAGNOSIS — L821 Other seborrheic keratosis: Secondary | ICD-10-CM | POA: Diagnosis not present

## 2021-03-25 DIAGNOSIS — C44619 Basal cell carcinoma of skin of left upper limb, including shoulder: Secondary | ICD-10-CM | POA: Diagnosis not present

## 2021-03-25 DIAGNOSIS — D225 Melanocytic nevi of trunk: Secondary | ICD-10-CM | POA: Diagnosis not present

## 2021-03-25 DIAGNOSIS — Z950 Presence of cardiac pacemaker: Secondary | ICD-10-CM

## 2021-03-25 DIAGNOSIS — Z85828 Personal history of other malignant neoplasm of skin: Secondary | ICD-10-CM | POA: Diagnosis not present

## 2021-03-25 NOTE — Progress Notes (Signed)
Cardiology Office Note:    Date:  03/29/2021   ID:  Kyle Avila, DOB [birth year], MRN [id number]  PCP:  Kyle Solian, MD   [facility] Providers Cardiologist:  Kyle Mocha, MD Electrophysiologist:  Kyle Axe, MD     Referring MD: Kyle Solian, MD   Chief Complaint  Patient presents with   Coronary Artery Disease    History of Present Illness:    Kyle Avila is a 75 y.o. 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  He reports stable symptoms.  He continues to premedicate with sublingual nitroglycerin before exercise.  This is a longstanding practice for him.  He has been participating in exercise classes in order to try to get himself ready for ski season this year.  He is going skiing in [place] in the next few weeks.  He was disappointed in his ski performance last year due to progressive fatigue.  He is hopeful that with his preparation this year he will be able to have a better ski season.  He has exertional angina which has not changed since his last visit.  No shortness of breath, orthopnea, or PND.  Medications are unchanged.  He has been taking an extra amlodipine on occasion and wonders if he should continue with this.  Past Medical History:  Diagnosis Date   Arthritis    "some in my back" (04/14/2017)   CAD (coronary artery disease)    a.  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[place])    s/p PTVDP   Gout    HTN  (hypertension)    Hyperlipidemia    Lupus ([place])    "that's why my kidneys are gone; lupus attacked them" (04/14/2017)   Mitral insufficiency    Myocardial infarction ([place])    "I've had some mild one" (04/14/2017)   Presence of permanent cardiac pacemaker    Skin cancer    "nose"   VT (ventricular tachycardia)-nonsustained 05/20/2011    Past Surgical History:  Procedure Laterality Date   ACHILLES TENDON REPAIR Right    CARDIAC CATHETERIZATION     CARDIAC CATHETERIZATION N/A 11/03/2014   Procedure: Left Heart Cath and Cors/Grafts Angiography;  Surgeon: Kyle Mocha, MD;  Location: [facility];  Service: Cardiovascular;  Laterality: N/A;   CARDIAC CATHETERIZATION N/A 11/03/2014   Procedure: Coronary Stent Intervention;  Surgeon: Kyle Mocha, MD;  Location: [place] [facility];  Service: Cardiovascular;  Laterality: N/A;   CAROTID ENDARTERECTOMY  2002   "? side"   CATARACT EXTRACTION W/ INTRAOCULAR LENS IMPLANT     "? side"   CORONARY ANGIOPLASTY WITH STENT PLACEMENT  04/14/2017   CORONARY ANGIOPLASTY WITH STENT PLACEMENT  11/03/2014   SVG PVA   CORONARY ARTERY BYPASS GRAFT     with redo cabg   CORONARY STENT INTERVENTION N/A 10/18/2019   Procedure: CORONARY STENT INTERVENTION;  Surgeon:  Kyle Mocha, MD;  Location: [place] [facility];  Service: Cardiovascular;  Laterality: N/A;   CORONARY/GRAFT ANGIOGRAPHY N/A 10/18/2019   Procedure: CORONARY/GRAFT ANGIOGRAPHY;  Surgeon: Kyle Mocha, MD;  Location: [place] [facility];  Service: Cardiovascular;  Laterality: N/A;   EP IMPLANTABLE DEVICE N/A 09/13/2014   Procedure: PPM Generator Changeout;  Surgeon: Deboraha Sprang, MD;  Location: [place] [facility];  Service: Cardiovascular;  Laterality: N/A;   LEFT HEART CATH AND CORS/GRAFTS ANGIOGRAPHY N/A 04/14/2017   Procedure: LEFT HEART CATH AND CORS/GRAFTS ANGIOGRAPHY;  Surgeon: Kyle Mocha, MD;  Location: [place] [facility];  Service: Cardiovascular;  Laterality: N/A;   MOHS  SURGERY     "just outside my nose"   PACEMAKER INSERTION  2007   TONSILLECTOMY  1958    Current Medications: Current Meds  Medication Sig   allopurinol (ZYLOPRIM) 100 MG tablet Take 100 mg by mouth daily.    amLODipine (NORVASC) 10 MG tablet Take 1 tablet (10 mg total) by mouth daily.   aspirin EC 81 MG EC tablet Take 81 mg by mouth daily.    clopidogrel (PLAVIX) 75 MG tablet Take 75 mg by mouth daily.   Evolocumab 140 MG/ML SOSY Inject 140 mg into the skin every 14 (fourteen) days.    fluticasone (FLONASE) 50 MCG/ACT nasal spray Place 2 sprays into both nostrils in the morning and at bedtime.   furosemide (LASIX) 40 MG tablet Take 1 tablet (40 mg total) by mouth daily.   isosorbide mononitrate (IMDUR) 120 MG 24 hr tablet TAKE 1 TABLET (120 MG TOTAL) BY MOUTH DAILY.   ketoconazole (NIZORAL) 2 % cream Apply 1 application topically daily as needed for irritation.   losartan (COZAAR) 100 MG tablet Take 1 tablet (100 mg total) by mouth daily.   metoprolol succinate (TOPROL-XL) 50 MG 24 hr tablet TAKE 1 TABLET TWO TIMES DAILY. TAKE WITH OR IMMEDIATELY FOLLOWING MEALS.   nitroGLYCERIN (NITROSTAT) 0.4 MG SL tablet PLACE 1 TABLET UNDER THE TONGUE EVERY 5 MINUTES AS NEEDED FOR CHEST PAIN   ranolazine (RANEXA) 1000 MG SR tablet Take 1 tablet (1,000 mg total) by mouth 2 (two) times daily.   tamsulosin (FLOMAX) 0.4 MG CAPS Take 0.4 mg by mouth daily.     Allergies:   Patient has no known allergies.   Social History   Socioeconomic History   Marital status: Married    Spouse name: Not on file   Number of children: Not on file   Years of education: Not on file   Highest education level: Not on file  Occupational History   Not on file  Tobacco Use   Smoking status: Never    Passive exposure: Yes   Smokeless tobacco: Never  Vaping Use   Vaping Use: Never used  Substance and Sexual Activity   Alcohol use: Yes    Alcohol/week: 3.0 standard drinks    Types: 1 Glasses of wine, 1 Cans of  beer, 1 Standard drinks or equivalent per week    Comment: \   Drug use: No   Sexual activity: Not Currently  Other Topics Concern   Not on file  Social History Narrative   Not on file   Social Determinants of Health   Financial Resource Strain: Not on file  Food Insecurity: Not on file  Transportation Needs: Not on file  Physical Activity: Not on file  Stress: Not on file  Social Connections: Not on file     Family History: [name] family history  includes Cancer in his mother; Heart attack in his father; Heart disease in his father.  ROS:   Please see the history of present illness.    All other systems reviewed and are negative.  EKGs/Labs/Other Studies Reviewed:    The following studies were reviewed today: Cardiac Cath 10/18/19: 1.  Severe native vessel coronary artery disease with total occlusion of the LAD and native RCA, continued patency of the stented segment in the proximal circumflex 2.  Continued patency of the LIMA to LAD with stable moderate stenosis of 70% at the LIMA anastomotic site unchanged from previous studies 3.  Extensive severe disease within the previously stented segment of the saphenous vein graft to PDA as well as severe de novo stenosis just beyond the distal anastomotic site in the PDA, both lesions treated with PCI.  Successful stenting of the PDA and successful angioplasty and scoring balloon angioplasty of the saphenous vein graft within the previously implanted stents.   Recommend: Long-term dual antiplatelet therapy with aspirin and clopidogrel.  Postprocedural hydration in this patient with significant kidney disease.  Repeat metabolic panel tomorrow morning.  Discharge tomorrow if no complications arise.  EKG:  EKG is not ordered today.    Recent Labs: No results found for requested labs within last 8760 hours.  Recent Lipid Panel    Component Value Date/Time   CHOL 133 10/28/2019 0820   TRIG 254 (H) 10/28/2019 0820   HDL 41  10/28/2019 0820   CHOLHDL 3.2 10/28/2019 0820   CHOLHDL 2.4 10/19/2019 0306   VLDL 55 (H) 10/19/2019 0306   LDLCALC 52 10/28/2019 0820   LDLDIRECT 47 10/28/2019 0820     Risk Assessment/Calculations:           Physical Exam:    VS:  BP 110/70   Pulse 66   Ht 5\' 8"  (1.727 m)   Wt 167 lb 9.6 oz (76 kg)   SpO2 94%   BMI 25.48 kg/m     Wt Readings from Last 3 Encounters:  03/25/21 167 lb 9.6 oz (76 kg)  10/24/20 167 lb 9.6 oz (76 kg)  06/25/20 166 lb 6.4 oz (75.5 kg)     GEN:  Well nourished, well developed in no acute distress HEENT: Normal NECK: No JVD; No carotid bruits LYMPHATICS: No lymphadenopathy CARDIAC: RRR, no murmurs, rubs, gallops RESPIRATORY:  Clear to auscultation without rales, wheezing or rhonchi  ABDOMEN: Soft, non-tender, non-distended MUSCULOSKELETAL:  No edema; No deformity  SKIN: Warm and dry NEUROLOGIC:  Alert and oriented x 3 PSYCHIATRIC:  Normal affect   ASSESSMENT:    1. Coronary artery disease involving native coronary artery of native heart with angina pectoris ([place])   2. Essential hypertension   3. Mixed hyperlipidemia   4. Bilateral carotid artery stenosis   5. Cardiac pacemaker in situ   6. CKD (chronic kidney disease), stage IV (HCC)    PLAN:    In order of problems listed above:  The patient is clinically stable with CCS functional class II symptoms of angina.  He will continue on amlodipine 10 mg daily, isosorbide 120 mg daily, metoprolol succinate 50 mg twice daily, and ranolazine 1000 mg twice daily.  He remains on long-term dual antiplatelet therapy.  I discouraged him from taking extra amlodipine as this exceeds the maximal dosing recommendation.  We discussed his ski trip, exercise guidelines, and recommendations with respect to his medical therapy.  No changes are made today. Blood pressure well controlled.  He is at his goal  blood pressure today in the context of his chronic kidney disease. Lipids at goal on evolocumab.  LDL  cholesterol 67 mg/dL. Reviewed most recent carotid ultrasound from June 2022 demonstrating patent carotid endarterectomy site on the right and nonobstructive carotid plaquing on the left. Stable, followed by Dr. Caryl Comes. Most recent labs reviewed with creatinine 2.27.  This is stable from past readings.  The patient is on appropriate therapy with an ARB.  He avoids nephrotoxic medicines.  His medications are renally dosed as appropriate.  Overall seems to be doing well.      Medication Adjustments/Labs and Tests Ordered: Current medicines are reviewed at length with the patient today.  Concerns regarding medicines are outlined above.  No orders of the defined types were placed in this encounter.  No orders of the defined types were placed in this encounter.   Patient Instructions  Medication Instructions:  No changes *If you need a refill on your cardiac medications before your next appointment, please call your pharmacy*   Lab Work: none If you have labs (blood work) drawn today and your tests are completely normal, you will receive your results only by: [place] (if you have MyChart) OR A paper copy in the mail If you have any lab test that is abnormal or we need to change your treatment, we will call you to review the results.   Testing/Procedures: none   Follow-Up: At [facility], you and your health needs are our priority.  As part of our continuing mission to provide you with exceptional heart care, we have created designated Provider Care Teams.  These Care Teams include your primary Cardiologist (physician) and Advanced Practice Providers (APPs -  Physician Assistants and Nurse Practitioners) who all work together to provide you with the care you need, when you need it.  Your next appointment:   6 month(s)  The format for your next appointment:   In Person  Provider:   [name], MD     Other Instructions     Signed, Kyle Mocha, MD  03/29/2021  7:32 AM    [place]

## 2021-03-25 NOTE — Patient Instructions (Signed)
Medication Instructions:  No changes *If you need a refill on your cardiac medications before your next appointment, please call your pharmacy*   Lab Work: none If you have labs (blood work) drawn today and your tests are completely normal, you will receive your results only by: [place] (if you have MyChart) OR A paper copy in the mail If you have any lab test that is abnormal or we need to change your treatment, we will call you to review the results.   Testing/Procedures: none   Follow-Up: At [facility], you and your health needs are our priority.  As part of our continuing mission to provide you with exceptional heart care, we have created designated Provider Care Teams.  These Care Teams include your primary Cardiologist (physician) and Advanced Practice Providers (APPs -  Physician Assistants and Nurse Practitioners) who all work together to provide you with the care you need, when you need it.  Your next appointment:   6 month(s)  The format for your next appointment:   In Person  Provider:   [name], MD     Other Instructions

## 2021-03-29 ENCOUNTER — Encounter: Payer: Self-pay | Admitting: Cardiovascular Disease

## 2021-04-01 NOTE — Progress Notes (Signed)
Remote pacemaker transmission.   

## 2021-04-02 ENCOUNTER — Encounter: Payer: Self-pay | Admitting: Cardiovascular Disease

## 2021-04-03 ENCOUNTER — Ambulatory Visit: Payer: Medicare HMO | Admitting: Cardiovascular Disease

## 2021-04-09 NOTE — Progress Notes (Signed)
Cardiology Office Note Date:  04/09/2021  Patient ID:  Kyle Avila, Kyle Avila 12-30-1945, MRN [id number] PCP:  [name], MD  Cardiologist:  Dr. Burt Knack Electrophysiologist: Dr. Caryl Comes    Chief Complaint: Annual visit   History of Present Illness: Kyle Avila is a 75 y.o. male with history of CAD, chronic CHF (combined), PVD (carotid), CHB w/PPM, CKD (IV), HTN, HLD  He saw cardiology service w/S. 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 Burt Knack in July with some ongoing angina since his PCI in June, though had mistakenly not been taking his amlodipine and once resumed doing better. Taking a s/l NTG prior to his walks with no anginal symptoms No changes were made, planned to see Dr. Burt Knack in 6mo.   Last seen by Dr. Caryl Comes  him briefly while he came in July 2021 for unusual fatigue, to the device clinic for pacer evaluation, device noted without abnormal findings, RN note mentions Dr. Caryl Comes saw him, no changes were recommended, at this time, had noted missed amlodipine, he was feeling better back on it.  I saw him 04/13/20 Since that day of skiing and since home he has felt quite well.  IN fact, says that he has not needed to take a single s/l NTG since November 1st. He had not been skiing in 2 years and went to [place] to ski with 2 others.  He mentions that usually their group is larger and the time between runs is longer with somewhat built in rest periods, though with a smaller group, minimal waiting time between runs.  He was not on the steepest of runs, but not  small either, and somewhere between 7-8,083ft elevation. As we spoke, it did not seem that he was acutely/suddenly becoming tired or weak but as the day wore on, and by the time they were done he was "absolutely exhausted".  One of his friends even offered to carry his skis back to the lodge.  Outside of skiing they had been doing quite a bit of walking and was much more physically active the he has been in years He had no CP, no palpitations or cardiac awareness, and not even really SOB, just very tired. Otherwise her is going quite well, goes to the gym, is active and denies any symptoms, no exertional intolerances, walks regularly. No near syncope or syncope. He has a chronic cough for years that he says no one has been able to fiugure out or cure. (I rechecked his O2 sat was 96-97% RA) He is not perfect about his medicines, admits to occasionally missing some pills, this will include his asa or plavix. Initially I suspected perhaps upper rate behaviors with plans likely to increase tracking rates, though ultimately felt he had just likely overdone it with highly exertional activity he had not done in years and no changes were made.  He saw Dr. Burt Knack 03/25/21, he was feeling well, working out and trying to ready himself physically for his skiing trip to [place] hoping to be able to have better  stamina this season.  TODAY He had a great skiing trip. Felt well skiing, had no CP whatsoever, it did make him exhausted at the end of the day, but felt great skiing. He is following with nephrology regularly No near syncope or syncope.  Device information MDT dual chamber PPM implanted 2007, gen change 09/13/2014   Past Medical History:  Diagnosis Date   Arthritis    "some in my back" (04/14/2017)   CAD (coronary artery disease)    a.  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; lupus attacked them" (04/14/2017)   Mitral insufficiency    Myocardial infarction ([place])    "I've had some mild one" (04/14/2017)   Presence of permanent cardiac pacemaker    Skin cancer    "nose"   VT (ventricular tachycardia)-nonsustained 05/20/2011    Past Surgical History:  Procedure Laterality Date   ACHILLES TENDON REPAIR Right    CARDIAC CATHETERIZATION     CARDIAC CATHETERIZATION N/A 11/03/2014   Procedure: Left Heart Cath and Cors/Grafts Angiography;  Surgeon: [name], MD;  Location: [facility];  Service: Cardiovascular;  Laterality: N/A;   CARDIAC CATHETERIZATION N/A 11/03/2014   Procedure: Coronary Stent Intervention;  Surgeon: [name], MD;  Location: [facility];  Service: Cardiovascular;  Laterality: N/A;   CAROTID ENDARTERECTOMY  2002   "? side"   CATARACT EXTRACTION W/ INTRAOCULAR LENS IMPLANT     "? side"   CORONARY ANGIOPLASTY WITH STENT PLACEMENT  04/14/2017   CORONARY ANGIOPLASTY WITH STENT PLACEMENT  11/03/2014   SVG PVA   CORONARY ARTERY BYPASS GRAFT     with redo cabg   CORONARY STENT INTERVENTION N/A 10/18/2019   Procedure: CORONARY STENT INTERVENTION;  Surgeon: [name], MD;  Location: [facility];  Service: Cardiovascular;  Laterality: N/A;   CORONARY/GRAFT ANGIOGRAPHY N/A 10/18/2019   Procedure: CORONARY/GRAFT ANGIOGRAPHY;  Surgeon: [name], MD;  Location: [facility];  Service: Cardiovascular;  Laterality: N/A;   EP IMPLANTABLE DEVICE N/A 09/13/2014   Procedure: PPM Generator Changeout;  Surgeon: Deboraha Sprang, MD;  Location: [facility];  Service: Cardiovascular;  Laterality: N/A;   LEFT HEART CATH AND CORS/GRAFTS ANGIOGRAPHY N/A 04/14/2017   Procedure: LEFT HEART CATH AND CORS/GRAFTS ANGIOGRAPHY;  Surgeon: [name], MD;  Location: [facility];   Service: Cardiovascular;  Laterality: N/A;   MOHS SURGERY     "just outside my nose"   PACEMAKER INSERTION  2007   TONSILLECTOMY  1958    Current Outpatient Medications  Medication Sig Dispense Refill   allopurinol (ZYLOPRIM) 100 MG tablet Take 100 mg by mouth daily.      amLODipine (NORVASC) 10 MG tablet Take 1 tablet (10 mg total) by mouth daily. 90 tablet 2   aspirin EC 81 MG EC tablet Take 81 mg by mouth daily.  1 tablet 0   clopidogrel (PLAVIX) 75 MG tablet Take 75 mg by mouth daily.     Evolocumab 140 MG/ML SOSY Inject 140 mg into the skin every 14 (fourteen) days.      fluticasone (FLONASE) 50 MCG/ACT nasal spray Place  2 sprays into both nostrils in the morning and at bedtime.     furosemide (LASIX) 40 MG tablet Take 1 tablet (40 mg total) by mouth daily. 30 tablet    isosorbide mononitrate (IMDUR) 120 MG 24 hr tablet TAKE 1 TABLET (120 MG TOTAL) BY MOUTH DAILY. 90 tablet 2   isosorbide mononitrate (IMDUR) 30 MG 24 hr tablet Take one tablet by mouth ( 30 mg) daily with your (120 mg) tablet. (Patient not taking: Reported on 03/25/2021) 90 tablet 3   ketoconazole (NIZORAL) 2 % cream Apply 1 application topically daily as needed for irritation.     losartan (COZAAR) 100 MG tablet Take 1 tablet (100 mg total) by mouth daily. 90 tablet 3   metoprolol succinate (TOPROL-XL) 50 MG 24 hr tablet TAKE 1 TABLET TWO TIMES DAILY. TAKE WITH OR IMMEDIATELY FOLLOWING MEALS. 180 tablet 1   nitroGLYCERIN (NITROSTAT) 0.4 MG SL tablet PLACE 1 TABLET UNDER THE TONGUE EVERY 5 MINUTES AS NEEDED FOR CHEST PAIN 25 tablet 3   predniSONE (DELTASONE) 10 MG tablet Take 6 tablets by mouth on day 1 and decrease by 1 tablet each day. 6,5,4,3,2,1 (Patient not taking: Reported on 03/25/2021) 21 tablet 0   ranolazine (RANEXA) 1000 MG SR tablet Take 1 tablet (1,000 mg total) by mouth 2 (two) times daily. 180 tablet 3   tamsulosin (FLOMAX) 0.4 MG CAPS Take 0.4 mg by mouth daily.     No current facility-administered  medications for this visit.    Allergies:   Patient has no known allergies.   Social History:  The patient  reports that he has never smoked. He has been exposed to tobacco smoke. He has never used smokeless tobacco. He reports current alcohol use of about 3.0 standard drinks per week. He reports that he does not use drugs.   Family History:  [name] family history includes Cancer in his mother; Heart attack in his father; Heart disease in his father.  ROS:  Please see the history of present illness.    All other systems are reviewed and otherwise negative.   PHYSICAL EXAM:  VS:  There were no vitals taken for this visit. BMI: There is no height or weight on file to calculate BMI. Well nourished, well developed, in no acute distress HEENT: normocephalic, atraumatic Neck: no JVD, carotid bruits or masses Cardiac:  RRR; no significant murmurs, no rubs, or gallops Lungs:  CTA b/l, no wheezing, rhonchi or rales Abd: soft, nontender MS: no deformity or atrophy Ext: trace, slightly more edema, he reports at his baseline, chronic looking skin changes Skin: warm and dry, no rash Neuro:  No gross deficits appreciated Psych: euthymic mood, full affect   PPM site (R side) is stable, no tethering or discomfort   EKG:  not done today  Device interrogation done today and reviewed by myself:  Battery and lead measurements are good No arrhythmias No R waves today at 40  10/25/2020: Carotid US Summary:  Right Carotid: Non-hemodynamically significant plaque <50% noted in the  CCA. The                 carotid endarterectomy site is well visualized  demonstrating                 patency with normal velocities.   Left Carotid: Velocities in the left ICA are consistent with a 1-39%  stenosis.                Non-hemodynamically significant plaque <50%  noted in the  CCA.   Vertebrals:  Bilateral vertebral arteries demonstrate antegrade flow.  Subclavians: Normal flow hemodynamics were  seen in bilateral subclavian               arteries.    02/24/2020 LE arterial US Summary:  Right: Resting right ankle-brachial index is within normal range. No  evidence of significant right lower extremity arterial disease. The right  toe-brachial index is normal.   Left: Resting left ankle-brachial index is within normal range. No  evidence of significant left lower extremity arterial disease. The left  toe-brachial index is normal. .     Cardiac catheterization 10/18/19 LM 100 LAD ost 100, mid 27 at anastomosis, dist 43 RI ost 100 LCx ost 20; OM1 100 RCA 100 CTO; RPDA 95 S-RPDA prox stent patent w 10 ISR, dist stent 90 ISR S-OM1 100 CTO L-LAD patent  PCI:  2.5 x 12 mm Resolute Onyx DES to RPDA; cutting balloon POBA to S-RPDA      Echocardiogram 08/19/19 EF 45-50, Gr 2 DD, inf-lat HK, normal RVSF, RVSP 31.5, mod BAE, mild MR, trivial AI   Echo 06/24/2016 Mild concentric LVH, EF 45-50, inferolateral/inferior/inferoseptal hypokinesis, grade 1 diastolic dysfunction, mild MR, mild LAE, mildly reduced RVSF, mild TR, PASP 32   Myoview 02/2014 Intermediate risk stress nuclear study with a fixed medium-sized, severe basal to mid inferolateral and basal to mid anterolateral perfusion defect.  This suggests prior infarction without significant ischemia.  Study rated intermediate risk due to low EF at 42%. LV Ejection Fraction: 42%.  LV Wall Motion:  Lateral and inferior hypokinesis.    Carotid US 11/2503 R CEA patent LICA < 39%    Recent Labs: No results found for requested labs within last 8760 hours.  No results found for requested labs within last 8760 hours.   CrCl cannot be calculated (Patient's most recent lab result is older than the maximum 21 days allowed.).   Wt Readings from Last 3 Encounters:  03/25/21 167 lb 9.6 oz (76 kg)  10/24/20 167 lb 9.6 oz (76 kg)  06/25/20 166 lb 6.4 oz (75.5 kg)     Other studies reviewed: Additional studies/records reviewed today  include: summarized above  ASSESSMENT AND PLAN:  1. PPM     Intact function     No programming changes made   2. CAD      He denies any angina, no use of his NTG in over a month     On ASA, plavix, BB, nitrate/ranexa, Repatha      c/w Dr. Burt Knack   3. HTN     Looks ok, no changes  4. HLD     Not adressed today  5. PVD     Carotid US June 2022 looks OK     No bruits appreciated     Disposition: remotes as usual, Dr. Bernardo Heater clinic in 1 year, sooner if needed  Current medicines are reviewed at length with the patient today.  The patient did not have any concerns regarding medicines.  Venetia Night, PA-C 04/09/2021 7:39 AM     [address] [place] [address] [phone] (office)  [phone] (fax)

## 2021-04-10 ENCOUNTER — Encounter: Payer: Self-pay | Admitting: Physician Assistant

## 2021-04-10 ENCOUNTER — Ambulatory Visit: Payer: Medicare HMO | Admitting: Physician Assistant

## 2021-04-10 ENCOUNTER — Other Ambulatory Visit: Payer: Self-pay

## 2021-04-10 VITALS — BP 108/60 | HR 56 | Ht 68.0 in | Wt 175.2 lb

## 2021-04-10 DIAGNOSIS — Z950 Presence of cardiac pacemaker: Secondary | ICD-10-CM

## 2021-04-10 DIAGNOSIS — I472 Ventricular tachycardia, unspecified: Secondary | ICD-10-CM

## 2021-04-10 DIAGNOSIS — I2510 Atherosclerotic heart disease of native coronary artery without angina pectoris: Secondary | ICD-10-CM | POA: Diagnosis not present

## 2021-04-10 DIAGNOSIS — I4430 Unspecified atrioventricular block: Secondary | ICD-10-CM

## 2021-04-10 DIAGNOSIS — I10 Essential (primary) hypertension: Secondary | ICD-10-CM

## 2021-04-10 LAB — CUP PACEART INCLINIC DEVICE CHECK
Battery Impedance: 698 Ohm
Battery Remaining Longevity: 67 mo
Battery Voltage: 2.78 V
Brady Statistic AP VP Percent: 60 %
Brady Statistic AP VS Percent: 0 %
Brady Statistic AS VP Percent: 40 %
Brady Statistic AS VS Percent: 0 %
Date Time Interrogation Session: 20221214150607
Implantable Lead Implant Date: 20070831
Implantable Lead Implant Date: 20070831
Implantable Lead Location: 753859
Implantable Lead Location: 753860
Implantable Lead Model: 4469
Implantable Lead Model: 4470
Implantable Lead Serial Number: 485949
Implantable Lead Serial Number: 553665
Implantable Pulse Generator Implant Date: 20160518
Lead Channel Impedance Value: 442 Ohm
Lead Channel Impedance Value: 490 Ohm
Lead Channel Pacing Threshold Amplitude: 0.5 V
Lead Channel Pacing Threshold Amplitude: 0.5 V
Lead Channel Pacing Threshold Amplitude: 0.5 V
Lead Channel Pacing Threshold Amplitude: 0.625 V
Lead Channel Pacing Threshold Pulse Width: 0.4 ms
Lead Channel Pacing Threshold Pulse Width: 0.4 ms
Lead Channel Pacing Threshold Pulse Width: 0.4 ms
Lead Channel Pacing Threshold Pulse Width: 0.4 ms
Lead Channel Sensing Intrinsic Amplitude: 1.4 mV
Lead Channel Setting Pacing Amplitude: 2 V
Lead Channel Setting Pacing Amplitude: 2.5 V
Lead Channel Setting Pacing Pulse Width: 0.4 ms
Lead Channel Setting Sensing Sensitivity: 2 mV

## 2021-04-10 NOTE — Patient Instructions (Signed)
Medication Instructions:   Your physician recommends that you continue on your current medications as directed. Please refer to the Current Medication list given to you today.  *If you need a refill on your cardiac medications before your next appointment, please call your pharmacy*   Lab Work: [place]    If you have labs (blood work) drawn today and your tests are completely normal, you will receive your results only by: [place] (if you have MyChart) OR A paper copy in the mail If you have any lab test that is abnormal or we need to change your treatment, we will call you to review the results.   Testing/Procedures: NONE ORDERED  TODAY    Follow-Up: At [facility], you and your health needs are our priority.  As part of our continuing mission to provide you with exceptional heart care, we have created designated Provider Care Teams.  These Care Teams include your primary Cardiologist (physician) and Advanced Practice Providers (APPs -  Physician Assistants and Nurse Practitioners) who all work together to provide you with the care you need, when you need it.  We recommend signing up for the patient portal called "MyChart".  Sign up information is provided on this After Visit Summary.  MyChart is used to connect with patients for Virtual Visits (Telemedicine).  Patients are able to view lab/test results, encounter notes, upcoming appointments, etc.  Non-urgent messages can be sent to your provider as well.   To learn more about what you can do with MyChart, go to [email or web address].    Your next appointment:   1 year(s)  The format for your next appointment:   In Person  Provider:   [name], MD    Other Instructions

## 2021-04-11 ENCOUNTER — Encounter: Payer: Self-pay | Admitting: Internal Medicine

## 2021-04-25 ENCOUNTER — Other Ambulatory Visit: Payer: Self-pay

## 2021-04-25 ENCOUNTER — Ambulatory Visit: Payer: Medicare HMO | Admitting: Pulmonary Disease

## 2021-04-25 ENCOUNTER — Encounter: Payer: Self-pay | Admitting: Pulmonary Disease

## 2021-04-25 VITALS — BP 130/62 | HR 64 | Temp 98.3°F | Ht 69.0 in | Wt 172.6 lb

## 2021-04-25 DIAGNOSIS — R053 Chronic cough: Secondary | ICD-10-CM | POA: Diagnosis not present

## 2021-04-25 NOTE — Patient Instructions (Addendum)
Nice to see you again  I sent a referral to the ENT doctors to take a look at the nasal passages to see if there is any anatomic blockages that may be hard to treat with medicines  Today to fill out paperwork for Nucala.  This is a medication to help with the eosinophils in the blood that can drive inflammation and mucus production in the lungs and the nasal passages.  Is an injection once every 28 days.  We will start the process of obtaining this.  Get you more information on cost etc.  The first injection will be performed in the office.  The other injections can be done at home with an autoinjector.  You are required to have an EpiPen as allergic reactions are possible although very rare.  We will prescribe this if you move forward with the Nucala.  Return to clinic in 3 months or sooner as needed with Dr. Silas Flood

## 2021-04-25 NOTE — Progress Notes (Signed)
Patient ID: Kyle Avila, male    DOB: 1945/08/17, 75 y.o.   MRN: [id number]  Chief Complaint  Patient presents with   Follow-up    Seen last year by [facility]. Still having a productive cough. Pt states that the mucus is white.     Referring provider: [name], MD  HPI:   Mr. Kyle Avila is a 75 y.o. with CAD s/p CABG x 2 (1988 and 1996) and multiple PCIs, chronic exertional angina who we are seeing in follow up of chronic cough.   Almost 47-month since last visit.  Prescribe Wixela mid dose at that time.  Chronic cough not responded to GERD for intranasal regimen.  Tried ICS/LABA.  No improvement in cough.  No longer using.  Continues to endorse a lot of nasal or sinus congestion.  Postnasal drip.  Phlegm he feels rattling in his chest at times.  Discussed at length that given chronicity of cough and refractory to traditional therapies for GERD, asthma, postnasal drip further evaluation was warranted.  Discussed role and rationale for ENT evaluation for potential anatomic causes of sinus or nasal congestion.  Concern for polyps.  Given his mild eosinophilia of 300 in the past, would be a good candidate for Nucala to treat both nasal and longer asthma inflammation.  HPI at initial visit: Cough present for ~4 years. Cough is severe. First noticed in fall and spring but now present year round. No clear exacerbating factors. Not worse in morning or at night or when supine. Constant throughout the day. Not worse when eating. Occasionally worse when eating dry crackers. Cough improves for weeks at a time after prednisone, usually 10 mg daily x 7 days. Most recent course was ~3 weeks ago.  Trialed flonase for post nasal drip and does not think it improved. Denies heartburn or regurgitation of food. Trialed once a day PPI for "months" without improved cough. Notes when first started his sputum was foul smelling after sneezing. This resolved months to years ago. Endorses occasional sinus  pressure that comes and goes. Unsure if cough is worse with pressure present or if prednisone improves sinus pressure. Denies post nasal drip or watery eyes. Does not think cough varies with seasons. No h/o asthma as child or younger adult. Denies DOE but notes significant issues with angina.   Had CXR per PCP for this cough but notes its been at least a couple of years. I can not acces this image. CXR reviewed 12/2005 with new R sided pacemaker placement, lungs clear, no evidence of hyperinflation on my interpretation.   PMH:  Smoker/ Smoking History: never smoker  Social History: Never smoker. Married. Retired from [employer].  Surgical history: CABG x 2, multiple PCIs  Family history: Father with heart attack in 25s, mother with cancer  PFT: 11/2019 reviewed and interpreted as normal spirometry, no bronchodilator response, lung volumes within normal limits, corrected DLCO within normal limits-normal PFTs  Lab Results: Personally reviewed, abs eos 300  CBC    Component Value Date/Time   WBC 5.2 11/01/2019 1627   WBC 5.2 10/19/2019 1210   RBC 3.56 (L) 11/01/2019 1627   RBC 3.68 (L) 10/19/2019 1210   HGB 12.6 (L) 11/01/2019 1627   HCT 38.3 11/01/2019 1627   PLT 165 11/01/2019 1627   MCV 108 (H) 11/01/2019 1627   MCH 35.4 (H) 11/01/2019 1627   MCH 35.6 (H) 10/19/2019 1210   MCHC 32.9 11/01/2019 1627   MCHC 32.9 10/19/2019 1210   RDW 12.4 11/01/2019 1627  LYMPHSABS 1.1 10/14/2019 0900   MONOABS 0.7 09/04/2014 1542   EOSABS 0.3 10/14/2019 0900   BASOSABS 0.0 10/14/2019 0900    BMET    Component Value Date/Time   NA 139 11/15/2019 1548   K 5.1 11/15/2019 1548   CL 100 11/15/2019 1548   CO2 25 11/15/2019 1548   GLUCOSE 106 (H) 11/15/2019 1548   GLUCOSE 104 (H) 10/19/2019 0306   BUN 48 (H) 11/15/2019 1548   CREATININE 2.67 (H) 11/15/2019 1548   CALCIUM 9.0 11/15/2019 1548   GFRNONAA 23 (L) 11/15/2019 1548   GFRAA 26 (L) 11/15/2019 1548    BNP No results found for:  BNP  ProBNP No results found for: PROBNP   Imaging: Personally reviewed and as interpretation in HPI above.  Specialty Problems       Pulmonary Problems   Chronic pansinusitis   Chronic cough    No Known Allergies  Immunization History  Administered Date(s) Administered   DTaP, 5 pertussis antigens 08/29/2016   Influenza Split 01/27/2011, 01/27/2012, 01/10/2013, 07/27/2013   Influenza, High Dose Seasonal PF 01/31/2019   Influenza, Quadrivalent, Recombinant, Inj, Pf 02/04/2019, 02/04/2020   Influenza-Unspecified 01/10/2021   PFIZER(Purple Top)SARS-COV-2 Vaccination 05/11/2019, 06/02/2019, 06/27/2019, 02/27/2020, 08/05/2020   Pneumococcal Conjugate-13 08/09/2014   Pneumococcal Polysaccharide-23 02/27/2007   Zoster, Live 07/27/2013    Past Medical History:  Diagnosis Date   Arthritis    "some in my back" (04/14/2017)   CAD (coronary artery disease)    a.  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HCC)    s/p PTVDP   Gout    HTN (hypertension)    Hyperlipidemia    Lupus ([place])    "that's why my kidneys are gone; lupus attacked them" (04/14/2017)   Mitral insufficiency    Myocardial infarction ([place])    "I've had some mild one" (04/14/2017)   Presence of permanent cardiac pacemaker    Skin cancer    "nose"   VT (ventricular tachycardia)-nonsustained 05/20/2011    Tobacco History: Social History   Tobacco Use  Smoking Status Never   Passive exposure: Yes  Smokeless Tobacco Never   Counseling given: Not Answered   Continue to not smoke  Outpatient Encounter Medications as of 04/25/2021  Medication Sig   allopurinol (ZYLOPRIM) 100 MG tablet Take 100 mg by mouth daily.    amLODipine (NORVASC) 10 MG tablet Take 1 tablet (10 mg total) by mouth daily.   aspirin EC 81 MG EC tablet Take 81 mg by mouth daily.    clopidogrel (PLAVIX)  75 MG tablet Take 75 mg by mouth daily.   Evolocumab 140 MG/ML SOSY Inject 140 mg into the skin every 14 (fourteen) days.    fluticasone (FLONASE) 50 MCG/ACT nasal spray Place 2 sprays into both nostrils in the morning and at bedtime.   furosemide (LASIX) 40 MG tablet Take 1 tablet (40 mg total) by mouth daily.   isosorbide mononitrate (IMDUR) 120 MG 24 hr tablet TAKE 1 TABLET (120 MG TOTAL) BY MOUTH DAILY.   ketoconazole (NIZORAL) 2 % cream Apply 1 application topically daily as needed for irritation.   losartan (COZAAR) 100 MG tablet Take 1 tablet (100 mg total) by mouth daily.   metoprolol succinate (TOPROL-XL) 50 MG 24 hr tablet TAKE 1 TABLET TWO TIMES DAILY. TAKE WITH OR IMMEDIATELY FOLLOWING  MEALS.   nitroGLYCERIN (NITROSTAT) 0.4 MG SL tablet PLACE 1 TABLET UNDER THE TONGUE EVERY 5 MINUTES AS NEEDED FOR CHEST PAIN   tamsulosin (FLOMAX) 0.4 MG CAPS Take 0.4 mg by mouth daily.   ranolazine (RANEXA) 1000 MG SR tablet Take 1 tablet (1,000 mg total) by mouth 2 (two) times daily.   [DISCONTINUED] predniSONE (DELTASONE) 10 MG tablet Take 6 tablets by mouth on day 1 and decrease by 1 tablet each day. 6,5,4,3,2,1 (Patient not taking: Reported on 04/10/2021)   No facility-administered encounter medications on file as of 04/25/2021.     Review of Systems  Review of Systems  N/a  Physical Exam  BP 130/62 (BP Location: Left Arm, Patient Position: Sitting, Cuff Size: Normal)    Pulse 64    Temp 98.3 F (36.8 C) (Oral)    Ht 5\' 9"  (1.753 m)    Wt 172 lb 9.6 oz (78.3 kg)    SpO2 100%    BMI 25.49 kg/m   Wt Readings from Last 5 Encounters:  04/25/21 172 lb 9.6 oz (78.3 kg)  04/10/21 175 lb 3.2 oz (79.5 kg)  03/25/21 167 lb 9.6 oz (76 kg)  10/24/20 167 lb 9.6 oz (76 kg)  06/25/20 166 lb 6.4 oz (75.5 kg)    BMI Readings from Last 5 Encounters:  04/25/21 25.49 kg/m  04/10/21 26.64 kg/m  03/25/21 25.48 kg/m  10/24/20 25.48 kg/m  06/25/20 25.30 kg/m     Physical Exam General: well  appearing, no acute distress Eyes: EOMI, no icterus Mouth: Dentition normal, no erythema of OP Respiratory: NWOB, lungs CTAB, no wheeze Cardiovascular: RRR, no murmurs, no gallops Skin: Warm, no rash, no lesions Neurologic: normal gait, CN intact Psych: Normal mood, full affect   Assessment & Plan:   Mr. Holtsclaw is a 75 y.o. with CAD s/p CABG x 2 (1988 and 1996) and multiple PCIs, chronic exertional angina who presents with chief comlpaint of chronic cough.   Differential for chronic cough includes silent reflux, chronic sinusitis and post nasal drip, cough variant asthma, and eosinophilic bronchitis. He is a never smoker.  He has trialed PPI without improvement and denies other GERD symptoms making GERD less likely. He improves with prednisone. High suspicion for cough variant asthma or eosinophilic bronchitis. Notably abs eos 300 on CBC 09/2019.  However, no response, no improvement in cough with mid dose ICS/LABA therapy via Wixela.  Chest x-ray clear.  PFTs within normal limits.  High suspicion for eosinophilic driver of symptoms and cough variant asthma.  Potential eosinophilic driver of nasal congestion, possible polyposis although no fiberoptic evaluation in the past.  Referral to ENT today for fiberoptic evaluation.  We will start process for Nucala as well given concern for cough variant asthma.   Return in about 3 months (around 07/24/2021).   Lanier Clam, MD 04/25/2021   This appointment required 45 minutes of patient care (this includes precharting, chart review, review of results, face-to-face care, etc.).

## 2021-04-29 ENCOUNTER — Encounter: Payer: Self-pay | Admitting: Pulmonary Disease

## 2021-04-29 ENCOUNTER — Encounter: Payer: Self-pay | Admitting: Cardiovascular Disease

## 2021-04-30 ENCOUNTER — Encounter: Payer: Self-pay | Admitting: Pulmonary Disease

## 2021-04-30 ENCOUNTER — Telehealth: Payer: Self-pay | Admitting: Pharmacist

## 2021-04-30 ENCOUNTER — Other Ambulatory Visit (HOSPITAL_COMMUNITY): Payer: Self-pay

## 2021-04-30 MED ORDER — NITROGLYCERIN 0.4 MG SL SUBL: SUBLINGUAL_TABLET | SUBLINGUAL | 3 refills | Status: DC

## 2021-04-30 MED ORDER — PREDNISONE 20 MG PO TABS: 40.0000 mg | ORAL_TABLET | Freq: Every day | ORAL | 0 refills | Status: AC

## 2021-04-30 NOTE — Telephone Encounter (Signed)
Please start Nucala SQ BIV.  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

## 2021-04-30 NOTE — Telephone Encounter (Signed)
Nucala BIV started in separate telephone encounter. Awaiting PAP paperwork that should have been completed on 04/25/21 OV per notes  Knox Saliva, PharmD, MPH, BCPS Clinical Pharmacist (Rheumatology and Pulmonology)

## 2021-04-30 NOTE — Telephone Encounter (Signed)
See other mychart. Closing encounter.  

## 2021-05-01 ENCOUNTER — Encounter: Payer: Self-pay | Admitting: Pulmonary Disease

## 2021-05-01 DIAGNOSIS — E785 Hyperlipidemia, unspecified: Secondary | ICD-10-CM | POA: Diagnosis not present

## 2021-05-01 DIAGNOSIS — H43811 Vitreous degeneration, right eye: Secondary | ICD-10-CM | POA: Diagnosis not present

## 2021-05-01 DIAGNOSIS — H26492 Other secondary cataract, left eye: Secondary | ICD-10-CM | POA: Diagnosis not present

## 2021-05-01 DIAGNOSIS — J8283 Eosinophilic asthma: Secondary | ICD-10-CM

## 2021-05-01 DIAGNOSIS — H31013 Macula scars of posterior pole (postinflammatory) (post-traumatic), bilateral: Secondary | ICD-10-CM | POA: Diagnosis not present

## 2021-05-01 DIAGNOSIS — Z125 Encounter for screening for malignant neoplasm of prostate: Secondary | ICD-10-CM | POA: Diagnosis not present

## 2021-05-01 DIAGNOSIS — H524 Presbyopia: Secondary | ICD-10-CM | POA: Diagnosis not present

## 2021-05-01 NOTE — Telephone Encounter (Signed)
Initiated a Prior Authorization request to [employer] for [place] via CoverMyMeds. PA form asking for documentation of Eosinophil levels:   a) greater than or equal to 150 cells/mcL at therapy initiation  b) greater than or equal to 300 cells/mcL in the previous 12 months  Labs referenced in most recent OV chart notes were obtained in 10/2019, will need to obtain more recent labs for PA submission.   Key: [id number]

## 2021-05-02 ENCOUNTER — Telehealth: Payer: Self-pay | Admitting: Pulmonary Disease

## 2021-05-02 ENCOUNTER — Other Ambulatory Visit (INDEPENDENT_AMBULATORY_CARE_PROVIDER_SITE_OTHER): Payer: Medicare HMO

## 2021-05-02 DIAGNOSIS — J8283 Eosinophilic asthma: Secondary | ICD-10-CM

## 2021-05-02 MED ORDER — PREDNISONE 20 MG PO TABS: 40.0000 mg | ORAL_TABLET | Freq: Every day | ORAL | 0 refills | Status: AC

## 2021-05-02 NOTE — Telephone Encounter (Signed)
Lab order placed for CBC w diff. Patient advised via MyChart. He will be out of town through Monday, 05/06/21  Knox Saliva, PharmD, MPH, BCPS Clinical Pharmacist (Rheumatology and Pulmonology)

## 2021-05-02 NOTE — Telephone Encounter (Signed)
Spoke with patient. He stated that he has misplaced his prednisone RX. He was able to start the prednisone 2 days ago and he only needs 4 more tablets. He would like to have this sent to [facility] [place]. I advised him that I would send in the 4 tablets for him. He verbalized understanding.   Nothing further needed at time of call.

## 2021-05-03 LAB — CBC WITH DIFFERENTIAL/PLATELET
Basophils Absolute: 0 10*3/uL (ref 0.0–0.1)
Basophils Relative: 0.4 % (ref 0.0–3.0)
Eosinophils Absolute: 0 10*3/uL (ref 0.0–0.7)
Eosinophils Relative: 0 % (ref 0.0–5.0)
HCT: 38.1 % — ABNORMAL LOW (ref 39.0–52.0)
Hemoglobin: 12.7 g/dL — ABNORMAL LOW (ref 13.0–17.0)
Lymphocytes Relative: 9.1 % — ABNORMAL LOW (ref 12.0–46.0)
Lymphs Abs: 0.7 10*3/uL (ref 0.7–4.0)
MCHC: 33.4 g/dL (ref 30.0–36.0)
MCV: 104.9 fl — ABNORMAL HIGH (ref 78.0–100.0)
Monocytes Absolute: 0.2 10*3/uL (ref 0.1–1.0)
Monocytes Relative: 3 % (ref 3.0–12.0)
Neutro Abs: 6.6 10*3/uL (ref 1.4–7.7)
Neutrophils Relative %: 87.5 % — ABNORMAL HIGH (ref 43.0–77.0)
Platelets: 173 10*3/uL (ref 150.0–400.0)
RBC: 3.63 Mil/uL — ABNORMAL LOW (ref 4.22–5.81)
RDW: 13.2 % (ref 11.5–15.5)
WBC: 7.6 10*3/uL (ref 4.0–10.5)

## 2021-05-03 NOTE — Telephone Encounter (Signed)
Received CBC w diff labs from [facility] that were drawn on 05/01/21  Submitted prior auth for Nucala to [facility] with OV notes and lab results (abs eosinophil >200 on 05/01/21)  Key: [id number]  Knox Saliva, PharmD, MPH, BCPS Clinical Pharmacist (Rheumatology and Pulmonology)

## 2021-05-05 ENCOUNTER — Encounter: Payer: Self-pay | Admitting: Pulmonary Disease

## 2021-05-06 ENCOUNTER — Other Ambulatory Visit (HOSPITAL_COMMUNITY): Payer: Self-pay

## 2021-05-06 NOTE — Telephone Encounter (Signed)
Received notification from [facility] regarding a prior authorization for Kyle Avila. Authorization has been APPROVED from 04/28/2021 to 04/27/2022.   Per test claim, copay for 28 days supply is $1,264.69  Patient can fill through [facility]: [phone]   Authorization # [id number]   Will begin working on PAP application.

## 2021-05-06 NOTE — Telephone Encounter (Signed)
[facility] [place] application placed in provider's bo for signature.  Patient portion placed on [place]'s desk for f/u regarding if patient wants to complete paper form or via [place], PharmD, MPH, BCPS Clinical Pharmacist (Rheumatology and Pulmonology)

## 2021-05-07 ENCOUNTER — Encounter: Payer: Self-pay | Admitting: Cardiovascular Disease

## 2021-05-07 NOTE — Telephone Encounter (Signed)
Submitted Patient Assistance Application to [facility] for NUCALA along with provider portion, PA and income documents. Will update patient when we receive a response.  Fax# [phone] Phone# [phone] 

## 2021-05-09 DIAGNOSIS — Z961 Presence of intraocular lens: Secondary | ICD-10-CM | POA: Diagnosis not present

## 2021-05-09 DIAGNOSIS — H35453 Secondary pigmentary degeneration, bilateral: Secondary | ICD-10-CM | POA: Diagnosis not present

## 2021-05-09 DIAGNOSIS — H353121 Nonexudative age-related macular degeneration, left eye, early dry stage: Secondary | ICD-10-CM | POA: Diagnosis not present

## 2021-05-09 DIAGNOSIS — H353211 Exudative age-related macular degeneration, right eye, with active choroidal neovascularization: Secondary | ICD-10-CM | POA: Diagnosis not present

## 2021-05-09 DIAGNOSIS — H35722 Serous detachment of retinal pigment epithelium, left eye: Secondary | ICD-10-CM | POA: Diagnosis not present

## 2021-05-09 DIAGNOSIS — H35361 Drusen (degenerative) of macula, right eye: Secondary | ICD-10-CM | POA: Diagnosis not present

## 2021-05-10 ENCOUNTER — Encounter: Payer: Self-pay | Admitting: Pulmonary Disease

## 2021-05-16 ENCOUNTER — Telehealth: Payer: Self-pay | Admitting: Pulmonary Disease

## 2021-05-18 DIAGNOSIS — J9601 Acute respiratory failure with hypoxia: Secondary | ICD-10-CM | POA: Diagnosis not present

## 2021-05-18 DIAGNOSIS — I2510 Atherosclerotic heart disease of native coronary artery without angina pectoris: Secondary | ICD-10-CM | POA: Diagnosis not present

## 2021-05-18 DIAGNOSIS — I5021 Acute systolic (congestive) heart failure: Secondary | ICD-10-CM | POA: Diagnosis not present

## 2021-05-18 DIAGNOSIS — Z20822 Contact with and (suspected) exposure to covid-19: Secondary | ICD-10-CM | POA: Diagnosis not present

## 2021-05-18 DIAGNOSIS — I21A1 Myocardial infarction type 2: Secondary | ICD-10-CM | POA: Diagnosis not present

## 2021-05-18 DIAGNOSIS — R531 Weakness: Secondary | ICD-10-CM | POA: Diagnosis not present

## 2021-05-18 DIAGNOSIS — R778 Other specified abnormalities of plasma proteins: Secondary | ICD-10-CM | POA: Diagnosis not present

## 2021-05-18 DIAGNOSIS — N179 Acute kidney failure, unspecified: Secondary | ICD-10-CM | POA: Diagnosis not present

## 2021-05-18 DIAGNOSIS — R9431 Abnormal electrocardiogram [ECG] [EKG]: Secondary | ICD-10-CM | POA: Diagnosis not present

## 2021-05-18 DIAGNOSIS — R0602 Shortness of breath: Secondary | ICD-10-CM | POA: Diagnosis not present

## 2021-05-18 DIAGNOSIS — E785 Hyperlipidemia, unspecified: Secondary | ICD-10-CM | POA: Diagnosis not present

## 2021-05-18 DIAGNOSIS — N184 Chronic kidney disease, stage 4 (severe): Secondary | ICD-10-CM | POA: Diagnosis not present

## 2021-05-18 DIAGNOSIS — I255 Ischemic cardiomyopathy: Secondary | ICD-10-CM | POA: Diagnosis not present

## 2021-05-18 DIAGNOSIS — I130 Hypertensive heart and chronic kidney disease with heart failure and stage 1 through stage 4 chronic kidney disease, or unspecified chronic kidney disease: Secondary | ICD-10-CM | POA: Diagnosis not present

## 2021-05-18 DIAGNOSIS — I472 Ventricular tachycardia, unspecified: Secondary | ICD-10-CM | POA: Diagnosis not present

## 2021-05-18 DIAGNOSIS — J90 Pleural effusion, not elsewhere classified: Secondary | ICD-10-CM | POA: Diagnosis not present

## 2021-05-18 DIAGNOSIS — N189 Chronic kidney disease, unspecified: Secondary | ICD-10-CM | POA: Diagnosis not present

## 2021-05-18 DIAGNOSIS — I509 Heart failure, unspecified: Secondary | ICD-10-CM | POA: Diagnosis not present

## 2021-05-18 DIAGNOSIS — E669 Obesity, unspecified: Secondary | ICD-10-CM | POA: Diagnosis not present

## 2021-05-18 DIAGNOSIS — R0902 Hypoxemia: Secondary | ICD-10-CM | POA: Diagnosis not present

## 2021-05-18 DIAGNOSIS — I5023 Acute on chronic systolic (congestive) heart failure: Secondary | ICD-10-CM | POA: Diagnosis not present

## 2021-05-18 DIAGNOSIS — J9811 Atelectasis: Secondary | ICD-10-CM | POA: Diagnosis not present

## 2021-05-18 DIAGNOSIS — I442 Atrioventricular block, complete: Secondary | ICD-10-CM | POA: Diagnosis not present

## 2021-05-19 ENCOUNTER — Encounter: Payer: Self-pay | Admitting: Cardiovascular Disease

## 2021-05-20 ENCOUNTER — Encounter: Payer: Self-pay | Admitting: Cardiovascular Disease

## 2021-05-20 ENCOUNTER — Encounter: Payer: Self-pay | Admitting: Pulmonary Disease

## 2021-05-21 ENCOUNTER — Encounter: Payer: Self-pay | Admitting: Pulmonary Disease

## 2021-05-24 ENCOUNTER — Encounter: Payer: Medicare HMO | Admitting: Internal Medicine

## 2021-05-27 ENCOUNTER — Other Ambulatory Visit (HOSPITAL_COMMUNITY): Payer: Self-pay

## 2021-05-27 ENCOUNTER — Encounter: Payer: Self-pay | Admitting: Cardiovascular Disease

## 2021-05-27 NOTE — Telephone Encounter (Signed)
Spoke to [employer] to [place], benefits investigation was on hold due to them needing patient's Medicare ID number. Provided requested info and now application is back in process. Should receive a determination in 24-48 hours.

## 2021-05-27 NOTE — Telephone Encounter (Signed)
Returned call, program requested patient's Medicare member number. Provided, application is in process and office will receive a fax for determination in 24-48 hours.  Medicare number- [id number]

## 2021-05-28 ENCOUNTER — Encounter: Payer: Self-pay | Admitting: Cardiovascular Disease

## 2021-05-28 DIAGNOSIS — J343 Hypertrophy of nasal turbinates: Secondary | ICD-10-CM | POA: Diagnosis not present

## 2021-05-28 DIAGNOSIS — Z950 Presence of cardiac pacemaker: Secondary | ICD-10-CM | POA: Diagnosis not present

## 2021-05-28 DIAGNOSIS — I129 Hypertensive chronic kidney disease with stage 1 through stage 4 chronic kidney disease, or unspecified chronic kidney disease: Secondary | ICD-10-CM | POA: Diagnosis not present

## 2021-05-28 DIAGNOSIS — N184 Chronic kidney disease, stage 4 (severe): Secondary | ICD-10-CM | POA: Diagnosis not present

## 2021-05-28 DIAGNOSIS — J9601 Acute respiratory failure with hypoxia: Secondary | ICD-10-CM | POA: Diagnosis not present

## 2021-05-28 DIAGNOSIS — E785 Hyperlipidemia, unspecified: Secondary | ICD-10-CM | POA: Diagnosis not present

## 2021-05-28 DIAGNOSIS — I5023 Acute on chronic systolic (congestive) heart failure: Secondary | ICD-10-CM | POA: Diagnosis not present

## 2021-05-28 DIAGNOSIS — R059 Cough, unspecified: Secondary | ICD-10-CM | POA: Diagnosis not present

## 2021-05-28 DIAGNOSIS — J3489 Other specified disorders of nose and nasal sinuses: Secondary | ICD-10-CM | POA: Diagnosis not present

## 2021-05-28 DIAGNOSIS — I25118 Atherosclerotic heart disease of native coronary artery with other forms of angina pectoris: Secondary | ICD-10-CM | POA: Diagnosis not present

## 2021-05-28 DIAGNOSIS — J342 Deviated nasal septum: Secondary | ICD-10-CM | POA: Diagnosis not present

## 2021-05-28 DIAGNOSIS — R053 Chronic cough: Secondary | ICD-10-CM | POA: Diagnosis not present

## 2021-05-28 NOTE — Telephone Encounter (Signed)
Called pt and left message to see if he still wanted guidance or had concerns r/t fluid status and SOB. Pt states he was going to another office today and would ask these same questions, so requested that if pt still wants guidance to let us know by call or send another message.

## 2021-05-29 ENCOUNTER — Encounter: Payer: Self-pay | Admitting: Pulmonary Disease

## 2021-05-29 NOTE — Telephone Encounter (Signed)
It could be very effective but if the stress on heart and fluid build up is primary cause of shortness of breath it will not help that. Nucala may help with cough since prednisone seems to help.   If we do not use Nucala, then we can continue to try inhalers and aggressive nasal treatments with the aid of our ENT colleagues to help with cough.

## 2021-05-29 NOTE — Telephone Encounter (Signed)
Mychart message sent by pt. Routing to both pharmacy team as well as Dr. Silas Flood. Please advise.

## 2021-06-03 ENCOUNTER — Encounter: Payer: Self-pay | Admitting: Pulmonary Disease

## 2021-06-03 ENCOUNTER — Telehealth: Payer: Self-pay | Admitting: Pulmonary Disease

## 2021-06-03 NOTE — Telephone Encounter (Signed)
Please add as a video visit for 4:15 PM with me today

## 2021-06-03 NOTE — Telephone Encounter (Signed)
Dr. Loanne Drilling, please see mychart messages sent by pt and advise.

## 2021-06-03 NOTE — Telephone Encounter (Signed)
Called pt back and he did not answer. PLEASE SCHEDULE VISIT WHEN HE CALLS BACK.

## 2021-06-03 NOTE — Telephone Encounter (Signed)
Since pt was not able to be reached by phone about this, mychart message has been sent to him. Will await response.

## 2021-06-04 DIAGNOSIS — E875 Hyperkalemia: Secondary | ICD-10-CM | POA: Diagnosis not present

## 2021-06-04 DIAGNOSIS — I5040 Unspecified combined systolic (congestive) and diastolic (congestive) heart failure: Secondary | ICD-10-CM | POA: Diagnosis not present

## 2021-06-04 DIAGNOSIS — N2581 Secondary hyperparathyroidism of renal origin: Secondary | ICD-10-CM | POA: Diagnosis not present

## 2021-06-04 DIAGNOSIS — I2510 Atherosclerotic heart disease of native coronary artery without angina pectoris: Secondary | ICD-10-CM | POA: Diagnosis not present

## 2021-06-04 DIAGNOSIS — I129 Hypertensive chronic kidney disease with stage 1 through stage 4 chronic kidney disease, or unspecified chronic kidney disease: Secondary | ICD-10-CM | POA: Diagnosis not present

## 2021-06-04 DIAGNOSIS — N184 Chronic kidney disease, stage 4 (severe): Secondary | ICD-10-CM | POA: Diagnosis not present

## 2021-06-04 DIAGNOSIS — R809 Proteinuria, unspecified: Secondary | ICD-10-CM | POA: Diagnosis not present

## 2021-06-05 ENCOUNTER — Encounter: Payer: Self-pay | Admitting: Cardiovascular Disease

## 2021-06-05 ENCOUNTER — Encounter: Payer: Self-pay | Admitting: Pulmonary Disease

## 2021-06-05 ENCOUNTER — Ambulatory Visit: Payer: Medicare HMO | Admitting: Cardiovascular Disease

## 2021-06-05 ENCOUNTER — Other Ambulatory Visit: Payer: Self-pay

## 2021-06-05 ENCOUNTER — Encounter: Payer: Self-pay | Admitting: Internal Medicine

## 2021-06-05 VITALS — BP 120/70 | HR 60 | Ht 68.0 in | Wt 173.4 lb

## 2021-06-05 DIAGNOSIS — I10 Essential (primary) hypertension: Secondary | ICD-10-CM | POA: Diagnosis not present

## 2021-06-05 DIAGNOSIS — E782 Mixed hyperlipidemia: Secondary | ICD-10-CM | POA: Diagnosis not present

## 2021-06-05 DIAGNOSIS — I5022 Chronic systolic (congestive) heart failure: Secondary | ICD-10-CM | POA: Diagnosis not present

## 2021-06-05 DIAGNOSIS — I25119 Atherosclerotic heart disease of native coronary artery with unspecified angina pectoris: Secondary | ICD-10-CM

## 2021-06-05 MED ORDER — AMLODIPINE BESYLATE 5 MG PO TABS: ORAL_TABLET | ORAL | 0 refills | Status: DC

## 2021-06-05 MED ORDER — FUROSEMIDE 80 MG PO TABS: ORAL_TABLET | ORAL | 3 refills | Status: DC

## 2021-06-05 NOTE — Telephone Encounter (Signed)
Called the pt and there was no answer- LMTCB- left detailed msg ok per DPR to call back for appt if still having symptoms and schedule appt.

## 2021-06-05 NOTE — Progress Notes (Signed)
Cardiology Office Note:    Date:  06/05/2021   ID:  Kyle Avila, DOB 04-Sep-1945, MRN [id number]  PCP:  [name], MD   [facility] Providers Cardiologist:  [name], MD Electrophysiologist:  [name], MD     Referring MD: [name], MD   Chief Complaint  Patient presents with   Coronary Artery Disease    History of Present Illness:    Kyle Avila is a 76 y.o. 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 He saw Dr Marval Regal yesterday and his furosemide was adjusted.  He had gained weight from 158 pounds on January 27 up to 164 pounds yesterday.  The patient [place] when he developed acute systolic heart failure requiring hospitalization last month.  I have reviewed records and they were sent to be scanned, but they are not currently available.  What I recall is that he did not have any significant ischemic event.  He did not undergo cardiac catheterization.  The calculated LVEF was 43% on his in-hospital echocardiogram.  This is close to his baseline LVEF which has been estimated at 45 to 50%.  His medications were adjusted.  The patient continues to feel somewhat rundown.  He denies orthopnea or PND.  He has developed a continued cough.  He has had no recent problems with angina.  He is taking nitroglycerin on a very rare basis.  Past Medical History:  Diagnosis Date   Arthritis    "some in my back" (04/14/2017)   CAD (coronary artery disease)    a.  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; lupus attacked them" (04/14/2017)   Mitral insufficiency    Myocardial infarction ([place])    "I've had some mild one" (04/14/2017)   Presence of permanent cardiac pacemaker    Skin cancer    "nose"   VT (ventricular tachycardia)-nonsustained 05/20/2011    Past Surgical History:  Procedure Laterality Date   ACHILLES TENDON REPAIR Right    CARDIAC CATHETERIZATION     CARDIAC CATHETERIZATION N/A 11/03/2014   Procedure: Left Heart Cath and Cors/Grafts Angiography;  Surgeon: [name], MD;  Location: [place] [facility];  Service: Cardiovascular;  Laterality: N/A;   CARDIAC CATHETERIZATION N/A 11/03/2014   Procedure: Coronary Stent Intervention;  Surgeon: [name], MD;  Location: [place] [facility];  Service: Cardiovascular;  Laterality: N/A;   CAROTID ENDARTERECTOMY  2002   "? side"   CATARACT EXTRACTION W/ INTRAOCULAR LENS IMPLANT     "? side"   CORONARY ANGIOPLASTY WITH STENT PLACEMENT  04/14/2017   CORONARY ANGIOPLASTY WITH STENT PLACEMENT  11/03/2014   SVG PVA   CORONARY  ARTERY BYPASS GRAFT     with redo cabg   CORONARY STENT INTERVENTION N/A 10/18/2019   Procedure: CORONARY STENT INTERVENTION;  Surgeon: [name], MD;  Location: [place] [facility];  Service: Cardiovascular;  Laterality: N/A;   CORONARY/GRAFT ANGIOGRAPHY N/A 10/18/2019   Procedure: CORONARY/GRAFT ANGIOGRAPHY;  Surgeon: [name], MD;  Location: [place] [facility];  Service: Cardiovascular;  Laterality: N/A;   EP IMPLANTABLE DEVICE N/A 09/13/2014   Procedure: PPM Generator Changeout;  Surgeon: Deboraha Sprang, MD;  Location: [place] [facility];  Service: Cardiovascular;  Laterality: N/A;   LEFT HEART CATH AND CORS/GRAFTS ANGIOGRAPHY N/A 04/14/2017   Procedure: LEFT HEART CATH AND CORS/GRAFTS  ANGIOGRAPHY;  Surgeon: [name], MD;  Location: [place] [facility];  Service: Cardiovascular;  Laterality: N/A;   MOHS SURGERY     "just outside my nose"   PACEMAKER INSERTION  2007   TONSILLECTOMY  1958    Current Medications: Current Meds  Medication Sig   allopurinol (ZYLOPRIM) 100 MG tablet Take 100 mg by mouth daily.    amLODipine (NORVASC) 10 MG tablet Take 1 tablet (10 mg total) by mouth daily.   aspirin EC 81 MG EC tablet Take 81 mg by mouth daily.    clopidogrel (PLAVIX) 75 MG tablet Take 75 mg by mouth daily.   Evolocumab 140 MG/ML SOSY Inject 140 mg into the skin every 14 (fourteen) days.    fluticasone (FLONASE) 50 MCG/ACT nasal spray Place 2 sprays into both nostrils in the morning and at bedtime.   furosemide (LASIX) 40 MG tablet Take 1 tablet (40 mg total) by mouth daily. (Patient taking differently: Take 40 mg by mouth daily. "I have taking 2 tablets this morning, and I will take 1 tablet after lunch".)   isosorbide mononitrate (IMDUR) 120 MG 24 hr tablet TAKE 1 TABLET (120 MG TOTAL) BY MOUTH DAILY.   ketoconazole (NIZORAL) 2 % cream Apply 1 application topically daily as needed for irritation.   losartan (COZAAR) 100 MG tablet Take 1 tablet (100 mg total) by mouth daily.   metoprolol succinate (TOPROL-XL) 50 MG 24 hr tablet TAKE 1 TABLET TWO TIMES DAILY. TAKE WITH OR IMMEDIATELY FOLLOWING MEALS.   nitroGLYCERIN (NITROSTAT) 0.4 MG SL tablet PLACE 1 TABLET UNDER THE TONGUE EVERY 5 MINUTES AS NEEDED FOR CHEST PAIN   tamsulosin (FLOMAX) 0.4 MG CAPS Take 0.4 mg by mouth daily.     Allergies:   Patient has no known allergies.   Social History   Socioeconomic History   Marital status: Married    Spouse name: Not on file   Number of children: Not on file   Years of education: Not on file   Highest education level: Not on file  Occupational History   Not on file  Tobacco Use   Smoking status: Never    Passive exposure: Yes   Smokeless tobacco: Never  Vaping Use    Vaping Use: Never used  Substance and Sexual Activity   Alcohol use: Yes    Alcohol/week: 3.0 standard drinks    Types: 1 Glasses of wine, 1 Cans of beer, 1 Standard drinks or equivalent per week    Comment: \   Drug use: No   Sexual activity: Not Currently  Other Topics Concern   Not on file  Social History Narrative   Not on file   Social Determinants of Health   Financial Resource Strain: Not on file  Food Insecurity: Not on file  Transportation Needs: Not  on file  Physical Activity: Not on file  Stress: Not on file  Social Connections: Not on file     Family History: [name] family history includes Cancer in his mother; Heart attack in his father; Heart disease in his father.  ROS:   Please see the history of present illness.    All other systems reviewed and are negative.  EKGs/Labs/Other Studies Reviewed:    The following studies were reviewed today: Echo 08/19/2019: IMPRESSIONS     1. Left ventricular ejection fraction, by estimation, is 45 to 50%. The  left ventricle has mildly decreased function. The left ventricle has no  regional wall motion abnormalities. Left ventricular diastolic parameters  are consistent with Grade II  diastolic dysfunction (pseudonormalization). Elevated left atrial  pressure. There is severe hypokinesis of the left ventricular, basal-mid  inferolateral wall.   2. Right ventricular systolic function is normal. The right ventricular  size is mildly enlarged. There is mildly elevated pulmonary artery  systolic pressure. The estimated right ventricular systolic pressure is  69.6 mmHg.   3. Left atrial size was moderately dilated.   4. Right atrial size was moderately dilated.   5. The mitral valve is normal in structure. Mild mitral valve  regurgitation. No evidence of mitral stenosis.   6. The aortic valve is normal in structure. Aortic valve regurgitation is  trivial. No aortic stenosis is present.   7. The inferior vena  cava is normal in size with greater than 50%  respiratory variability, suggesting right atrial pressure of 3 mmHg.   Comparison(s): Prior images reviewed side by side. The left ventricular  function is unchanged. The left ventricular wall motion abnormality is  unchanged. There are more prominent signs of increased mean left at   Cardiac Cath 10/18/2019: 1.  Severe native vessel coronary artery disease with total occlusion of the LAD and native RCA, continued patency of the stented segment in the proximal circumflex 2.  Continued patency of the LIMA to LAD with stable moderate stenosis of 70% at the LIMA anastomotic site unchanged from previous studies 3.  Extensive severe disease within the previously stented segment of the saphenous vein graft to PDA as well as severe de novo stenosis just beyond the distal anastomotic site in the PDA, both lesions treated with PCI.  Successful stenting of the PDA and successful angioplasty and scoring balloon angioplasty of the saphenous vein graft within the previously implanted stents.   Recommend: Long-term dual antiplatelet therapy with aspirin and clopidogrel.  Postprocedural hydration in this patient with significant kidney disease.  Repeat metabolic panel tomorrow morning.  Discharge tomorrow if no complications arise.  EKG:  EKG is not ordered today.    Recent Labs: 05/02/2021: Hemoglobin 12.7; Platelets 173.0  Recent Lipid Panel    Component Value Date/Time   CHOL 133 10/28/2019 0820   TRIG 254 (H) 10/28/2019 0820   HDL 41 10/28/2019 0820   CHOLHDL 3.2 10/28/2019 0820   CHOLHDL 2.4 10/19/2019 0306   VLDL 55 (H) 10/19/2019 0306   LDLCALC 52 10/28/2019 0820   LDLDIRECT 47 10/28/2019 0820     Risk Assessment/Calculations:           Physical Exam:    VS:  BP 120/70    Pulse 60    Ht 5\' 8"  (1.727 m)    Wt 173 lb 6.4 oz (78.7 kg)    SpO2 97%    BMI 26.37 kg/m     Wt Readings from Last 3 Encounters:  06/05/21 173 lb 6.4 oz (78.7 kg)   04/25/21 172 lb 9.6 oz (78.3 kg)  04/10/21 175 lb 3.2 oz (79.5 kg)     GEN:  Well nourished, well developed in no acute distress HEENT: Normal NECK: No JVD; No carotid bruits LYMPHATICS: No lymphadenopathy CARDIAC: RRR, 2/6 systolic murmur at the right upper sternal border RESPIRATORY:  Clear to auscultation without rales, wheezing or rhonchi  ABDOMEN: Soft, non-tender, non-distended MUSCULOSKELETAL: 1+ bilateral ankle edema; No deformity  SKIN: Warm and dry NEUROLOGIC:  Alert and oriented x 3 PSYCHIATRIC:  Normal affect   ASSESSMENT:    1. Chronic systolic heart failure ([place])   2. Coronary artery disease involving native coronary artery of native heart with angina pectoris ([place])   3. Essential hypertension   4. Mixed hyperlipidemia    PLAN:    In order of problems listed above:  The patient has had an episode of symptomatic heart failure with pulmonary edema during his recent ski trip.  I reviewed his medical program.  There are some limitations because of significant chronic kidney disease.  Now that he has had clinical heart failure, I think it is best to get him off of amlodipine.  He has previously been on this for chronic angina.  I am sure this is contributing to his edema as well.  Will reduce amlodipine to 5 mg daily for 2 weeks then he will stop it as long as he is doing okay.  His furosemide was adjusted yesterday to 80 mg in the morning and 40 mg in the afternoon.  Once his weight is back to baseline I think his maintenance dose should probably be increased to 80 mg once daily (has been on 40 mg once daily).  I will send my note to Dr. Marval Regal to make sure he is in agreement with this. Appears clinically stable.  Remains on long-term dual antiplatelet therapy.  Weaning him off of amlodipine as above.  He will continue isosorbide, ranolazine, and metoprolol succinate.  He has some concerns about the cost of ranolazine, and I told him we would keep this in mind with  potential to reduce it or discontinue it in the future. Blood pressure controlled Treated with Repatha.  Lipids have been in goal range with most recent LDL cholesterol 55 mg/dL.      Medication Adjustments/Labs and Tests Ordered: Current medicines are reviewed at length with the patient today.  Concerns regarding medicines are outlined above.  No orders of the defined types were placed in this encounter.  No orders of the defined types were placed in this encounter.   There are no Patient Instructions on file for this visit.   Signed, [name], MD  06/05/2021 10:18 AM    [place]

## 2021-06-05 NOTE — Patient Instructions (Signed)
Medication Instructions:  DECREASE Amlodipine to 5mg  daily for 2 weeks, then discontinue CHANGE Furosemide to 80mg  each morning, 40mg  in the afternoon until fluid resolved, then begin normal daily dose of 80mg  thereafter *If you need a refill on your cardiac medications before your next appointment, please call your pharmacy*   Lab Work: NONE If you have labs (blood work) drawn today and your tests are completely normal, you will receive your results only by: [place] (if you have MyChart) OR A paper copy in the mail If you have any lab test that is abnormal or we need to change your treatment, we will call you to review the results.   Testing/Procedures: NONE   Follow-Up: At [facility], you and your health needs are our priority.  As part of our continuing mission to provide you with exceptional heart care, we have created designated Provider Care Teams.  These Care Teams include your primary Cardiologist (physician) and Advanced Practice Providers (APPs -  Physician Assistants and Nurse Practitioners) who all work together to provide you with the care you need, when you need it.   Your next appointment:   July 15, 2021 at 1:20pm  The format for your next appointment:   In Person  Provider:   [name], MD    Thank you for allowing [place] [facility] to serve you, God Bless!!

## 2021-06-05 NOTE — Telephone Encounter (Signed)
Dr. Silas Flood, please see mychart message sent by pt and advise. If needed, please see prior messages from this past week that pt sent [place] too.

## 2021-06-05 NOTE — Telephone Encounter (Signed)
Called [facility] (Dr Marval Regal) and spoke to [facility] who states she will send over labs and office visit note at this time. Verified fax #.

## 2021-06-06 NOTE — Telephone Encounter (Signed)
Received a fax from  [place] to [place] regarding an approval for [place] patient assistance from 06/06/21 to 04/27/22.   Phone number: [phone]   ATC patient regarding Nucala approval. Phone went straight to VM  Knox Saliva, PharmD, MPH, BCPS Clinical Pharmacist (Rheumatology and Pulmonology)

## 2021-06-06 NOTE — Telephone Encounter (Signed)
Patient returned call. We reviewed approval through patient assistance for Nucala  Patient states he had CHF exacerbation. Advised that this does not interact with his CHF management. He'd like to ensure that Dr. Silas Flood is aware and plan is still to move forward with Nucala. He is okay to trial Nucala knowing that he will not have to pay for it. Per review of ENT note, they did not have any specific recommendations at visit and wanted patient to f/u with pulmonology as scheduled and ENT prn.  Routing to Dr. Silas Flood for advisement  Knox Saliva, PharmD, MPH, BCPS Clinical Pharmacist (Rheumatology and Pulmonology)

## 2021-06-10 ENCOUNTER — Encounter: Payer: Self-pay | Admitting: Cardiovascular Disease

## 2021-06-10 DIAGNOSIS — I25119 Atherosclerotic heart disease of native coronary artery with unspecified angina pectoris: Secondary | ICD-10-CM

## 2021-06-10 DIAGNOSIS — I5022 Chronic systolic (congestive) heart failure: Secondary | ICD-10-CM

## 2021-06-10 NOTE — Telephone Encounter (Signed)
Will forward new patient message to Dr. Silas Flood to advise further if needed. Thank you!

## 2021-06-11 ENCOUNTER — Encounter: Payer: Self-pay | Admitting: Cardiovascular Disease

## 2021-06-11 NOTE — Telephone Encounter (Signed)
Pt reached out, inquiring about potential benefit of cardiac rehab. He has angina and fatigue with activity. Will make referral. I think he would benefit from outpatient cardiac rehab. Diagnoses include: 1) CAD, native vessel and bypass graft disease, with angina 2) Chronic systolic heart failure   Thx   [name] 06/11/2021 1:11 PM   Order placed at this time.

## 2021-06-11 NOTE — Progress Notes (Unsigned)
Pt reached out, inquiring about potential benefit of cardiac rehab. He has angina and fatigue with activity. Will make referral. I think he would benefit from outpatient cardiac rehab. Diagnoses include: 1) CAD, native vessel and bypass graft disease, with angina 2) Chronic systolic heart failure  Thx  [name] 06/11/2021 1:11 PM

## 2021-06-12 ENCOUNTER — Encounter: Payer: Self-pay | Admitting: Pulmonary Disease

## 2021-06-17 ENCOUNTER — Other Ambulatory Visit: Payer: Self-pay

## 2021-06-17 ENCOUNTER — Ambulatory Visit: Payer: Medicare HMO | Admitting: Podiatry

## 2021-06-17 ENCOUNTER — Encounter (HOSPITAL_COMMUNITY): Payer: Self-pay | Admitting: *Deleted

## 2021-06-17 ENCOUNTER — Telehealth: Payer: Self-pay | Admitting: Pulmonary Disease

## 2021-06-17 ENCOUNTER — Encounter: Payer: Self-pay | Admitting: Podiatry

## 2021-06-17 DIAGNOSIS — L601 Onycholysis: Secondary | ICD-10-CM | POA: Diagnosis not present

## 2021-06-17 DIAGNOSIS — R413 Other amnesia: Secondary | ICD-10-CM | POA: Insufficient documentation

## 2021-06-17 DIAGNOSIS — B351 Tinea unguium: Secondary | ICD-10-CM | POA: Diagnosis not present

## 2021-06-17 DIAGNOSIS — I5023 Acute on chronic systolic (congestive) heart failure: Secondary | ICD-10-CM | POA: Insufficient documentation

## 2021-06-17 DIAGNOSIS — N184 Chronic kidney disease, stage 4 (severe): Secondary | ICD-10-CM | POA: Diagnosis not present

## 2021-06-17 MED ORDER — CICLOPIROX 8 % EX SOLN: Freq: Every day | CUTANEOUS | 0 refills | Status: DC

## 2021-06-17 NOTE — Telephone Encounter (Signed)
Called patient and he stated that sometimes he has really bad coughing fits. He states that its not all the time but when they do occur its bad. He is to start Nucala on March 10th with [facility].  Please advise Dr Silas Flood

## 2021-06-17 NOTE — Patient Instructions (Signed)
Soak Instructions    THE DAY AFTER THE PROCEDURE  Place 1/4 cup of epsom salts (or betadine, or white vinegar) in a quart of warm tap water.  Submerge your foot or feet with outer bandage intact for the initial soak; this will allow the bandage to become moist and wet for easy lift off.  Once you remove your bandage, continue to soak in the solution for 20 minutes.  This soak should be done twice a day.  Next, remove your foot or feet from solution, blot dry the affected area and cover.  You may use a band aid large enough to cover the area or use gauze and tape.  Apply other medications to the area as directed by the doctor such as polysporin neosporin.  IF YOUR SKIN BECOMES IRRITATED WHILE USING THESE INSTRUCTIONS, IT IS OKAY TO SWITCH TO  WHITE VINEGAR AND WATER. Or you may use antibacterial soap and water to keep the toe clean  Monitor for any signs/symptoms of infection. Call the office immediately if any occur or go directly to the emergency room. Call with any questions/concerns.    [place]

## 2021-06-17 NOTE — Progress Notes (Signed)
Received referral from Dr. Burt Knack for this pt to participate in Cardiac rehab with the diagnosis of Chronic Stable Angina. Clinical review of pt follow up appt on 06/05/21 with Dr. Burt Knack - cardiologist office note.  Pt with hospitalization for Acute Systolic HF while on a ski trip.  Unable to review those records - checked in [facility] for scanned documents.  Pt appropriate for scheduling for on site cardiac rehab and/or enrollment in Virtual Cardiac Rehab.  Pt Covid Risk Score is 7.  Will forward to staff for follow up. Cherre Huger, BSN Cardiac and Training and development officer

## 2021-06-17 NOTE — Progress Notes (Signed)
°  Subjective:  Patient ID: Kyle Avila, male    DOB: 10-Jun-1945,  MRN: [id number]  Chief Complaint  Patient presents with   Nail Problem    Patient presents today for nail fungus right great toenail.  He says it was painful yesterday on both borders of nail, but today feels normal   He would like to discuss treatment    76 y.o. male presents with the above complaint. History confirmed with patient.  Right hallux nail has become loose not sure how it happened and may have caught on something.  The nails are thickened elongated yellow and brown discolored.  Has been putting ketoconazole treatment on them.  Objective:  Physical Exam: warm, good capillary refill, no trophic changes or ulcerative lesions, normal DP and PT pulses, and normal sensory exam.  Right Foot: Right hallux nail dystrophic and loose with bleeding underneath the nailbed, erythema around this, onychomycosis of the remaining nails.    Assessment:   1. Traumatic onycholysis   2. Onychomycosis      Plan:  Patient was evaluated and treated and all questions answered.  Discussed etiology and treatment options with him in detail.  We discussed oral and topical treatments for onychomycosis.  I think oral medications be difficult with the amount of medications he takes currently.  He would prefer to avoid adding to this as well.  Ciclopirox was prescribed to him to begin using on the other nails.  I recommended avulsion of the hallux nail plate as it was loose and I do not expect it to improve at this point.  Following sterile prep with Betadine and digital block with lidocaine and Marcaine the right hallux nail was avulsed in a complete fashion without phenol matricectomy.  I will see him back in 4 months for reevaluation.  Return in about 4 months (around 10/15/2021) for follow up after nail fungus treatment / after great toe nail removal.

## 2021-06-18 MED ORDER — PREDNISONE 20 MG PO TABS: 40.0000 mg | ORAL_TABLET | Freq: Every day | ORAL | 0 refills | Status: AC

## 2021-06-18 NOTE — Telephone Encounter (Signed)
Prednisone 40 mg daily x5 days sent to local pharmacy.  Routing back to triage.

## 2021-06-18 NOTE — Telephone Encounter (Signed)
I am optimistic the Nucala will help with this. We can call in a few days of prednisone in the mean time if he feels like this is warranted. This has helped his cough in the past.

## 2021-06-18 NOTE — Telephone Encounter (Signed)
I spoke with the pt and notified of response from Dr Silas Flood. He states he would like for [place] to send in the prednisone now. He uses [facility]. Please advise rx and I will be happy to send, thanks!

## 2021-06-19 ENCOUNTER — Ambulatory Visit (INDEPENDENT_AMBULATORY_CARE_PROVIDER_SITE_OTHER): Payer: Medicare HMO

## 2021-06-19 ENCOUNTER — Encounter: Payer: Self-pay | Admitting: Podiatry

## 2021-06-19 DIAGNOSIS — I4430 Unspecified atrioventricular block: Secondary | ICD-10-CM | POA: Diagnosis not present

## 2021-06-19 DIAGNOSIS — H353211 Exudative age-related macular degeneration, right eye, with active choroidal neovascularization: Secondary | ICD-10-CM | POA: Diagnosis not present

## 2021-06-20 ENCOUNTER — Telehealth: Payer: Self-pay

## 2021-06-20 ENCOUNTER — Encounter: Payer: Self-pay | Admitting: Pulmonary Disease

## 2021-06-20 ENCOUNTER — Encounter: Payer: Self-pay | Admitting: Cardiovascular Disease

## 2021-06-20 LAB — CUP PACEART REMOTE DEVICE CHECK
Battery Impedance: 825 Ohm
Battery Remaining Longevity: 62 mo
Battery Voltage: 2.78 V
Brady Statistic AP VP Percent: 51 %
Brady Statistic AP VS Percent: 0 %
Brady Statistic AS VP Percent: 49 %
Brady Statistic AS VS Percent: 0 %
Date Time Interrogation Session: 20230223075938
Implantable Lead Implant Date: 20070831
Implantable Lead Implant Date: 20070831
Implantable Lead Location: 753859
Implantable Lead Location: 753860
Implantable Lead Model: 4469
Implantable Lead Model: 4470
Implantable Lead Serial Number: 485949
Implantable Lead Serial Number: 553665
Implantable Pulse Generator Implant Date: 20160518
Lead Channel Impedance Value: 476 Ohm
Lead Channel Impedance Value: 512 Ohm
Lead Channel Pacing Threshold Amplitude: 0.625 V
Lead Channel Pacing Threshold Amplitude: 0.625 V
Lead Channel Pacing Threshold Pulse Width: 0.4 ms
Lead Channel Pacing Threshold Pulse Width: 0.4 ms
Lead Channel Setting Pacing Amplitude: 2 V
Lead Channel Setting Pacing Amplitude: 2.5 V
Lead Channel Setting Pacing Pulse Width: 0.4 ms
Lead Channel Setting Sensing Sensitivity: 4 mV

## 2021-06-20 NOTE — Telephone Encounter (Signed)
Spoke with patient regarding transmission with new onset of AF, informed him that Dr. Caryl Comes recommended he be seen by the AF clinic to discuss [facility] patient voiced understanding.

## 2021-06-20 NOTE — Telephone Encounter (Signed)
Called and spoke with patient, he is agreeable to appt 06/24/21 with [facility].  Directions and phone number for Clinic provided.

## 2021-06-24 ENCOUNTER — Other Ambulatory Visit: Payer: Self-pay

## 2021-06-24 ENCOUNTER — Ambulatory Visit (HOSPITAL_COMMUNITY)
Admission: RE | Admit: 2021-06-24 | Discharge: 2021-06-24 | Disposition: A | Discharge status: Home / Self Care | Payer: Medicare HMO | Source: Ambulatory Visit | Attending: Physician Assistant | Admitting: Physician Assistant

## 2021-06-24 VITALS — BP 146/74 | HR 60 | Ht 68.0 in | Wt 168.4 lb

## 2021-06-24 DIAGNOSIS — Z7901 Long term (current) use of anticoagulants: Secondary | ICD-10-CM | POA: Insufficient documentation

## 2021-06-24 DIAGNOSIS — I480 Paroxysmal atrial fibrillation: Secondary | ICD-10-CM | POA: Insufficient documentation

## 2021-06-24 DIAGNOSIS — I110 Hypertensive heart disease with heart failure: Secondary | ICD-10-CM | POA: Insufficient documentation

## 2021-06-24 DIAGNOSIS — E785 Hyperlipidemia, unspecified: Secondary | ICD-10-CM | POA: Diagnosis not present

## 2021-06-24 DIAGNOSIS — I4819 Other persistent atrial fibrillation: Secondary | ICD-10-CM | POA: Diagnosis not present

## 2021-06-24 DIAGNOSIS — I255 Ischemic cardiomyopathy: Secondary | ICD-10-CM | POA: Diagnosis not present

## 2021-06-24 DIAGNOSIS — I5042 Chronic combined systolic (congestive) and diastolic (congestive) heart failure: Secondary | ICD-10-CM | POA: Insufficient documentation

## 2021-06-24 DIAGNOSIS — I2510 Atherosclerotic heart disease of native coronary artery without angina pectoris: Secondary | ICD-10-CM | POA: Diagnosis not present

## 2021-06-24 DIAGNOSIS — R400 Somnolence: Secondary | ICD-10-CM | POA: Diagnosis not present

## 2021-06-24 DIAGNOSIS — D6869 Other thrombophilia: Secondary | ICD-10-CM | POA: Insufficient documentation

## 2021-06-24 DIAGNOSIS — R0683 Snoring: Secondary | ICD-10-CM | POA: Insufficient documentation

## 2021-06-24 MED ORDER — APIXABAN 5 MG PO TABS: 5.0000 mg | ORAL_TABLET | Freq: Two times a day (BID) | ORAL | 3 refills | Status: DC

## 2021-06-24 NOTE — Progress Notes (Signed)
Primary Care Physician: [name], MD Primary Cardiologist: Dr Burt Knack Primary Electrophysiologist: Dr Caryl Comes Referring Physician: Dr Cristal Ford is a 76 y.o. male with a history of CAD, CHF/ischemic CM, carotid artery disease, CHB s/p PPM, CKD, HTN, HLD, atrial fibrillation who presents for consultation in the [facility].  The patient was initially diagnosed with atrial fibrillation 06/20/21 after the device clinic received an alert for an ongoing episode starting 05/26/21. Patient has a CHADS2VASC score of 5. He reports that since late January, he has felt much more fatigued with exertion. He does admit to snoring and daytime somnolence.   Today, he denies symptoms of palpitations, chest pain, shortness of breath, orthopnea, PND, lower extremity edema, dizziness, presyncope, syncope, bleeding, or neurologic sequela. The patient is tolerating medications without difficulties and is otherwise without complaint today.    Atrial Fibrillation Risk Factors:  he does have symptoms or diagnosis of sleep apnea. he is deferring sleep study for now. he does not have a history of rheumatic fever.   he has a BMI of Body mass index is 25.61 kg/m.Marland Kitchen Filed Weights   06/24/21 1056  Weight: 76.4 kg    Family History  Problem Relation Age of Onset   Heart attack Father        21s   Heart disease Father        Heart Disease before age 30   Cancer Mother      Atrial Fibrillation Management history:  Previous antiarrhythmic drugs: none Previous cardioversions: none Previous ablations: none CHADS2VASC score: 5 Anticoagulation history: none   Past Medical History:  Diagnosis Date   Arthritis    "some in my back" (04/14/2017)   CAD (coronary artery disease)    a.  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; lupus attacked them" (04/14/2017)   Mitral insufficiency    Myocardial infarction ([place])    "I've had some mild one" (04/14/2017)   Presence of permanent cardiac pacemaker    Skin cancer    "nose"   VT (ventricular tachycardia)-nonsustained 05/20/2011   Past Surgical History:  Procedure Laterality Date   ACHILLES TENDON REPAIR Right    CARDIAC CATHETERIZATION     CARDIAC CATHETERIZATION N/A 11/03/2014   Procedure: Left Heart Cath and Cors/Grafts Angiography;  Surgeon: [name], MD;  Location: [facility];  Service: Cardiovascular;  Laterality: N/A;   CARDIAC CATHETERIZATION N/A 11/03/2014   Procedure: Coronary Stent Intervention;  Surgeon: [name], MD;  Location: [facility];  Service: Cardiovascular;  Laterality: N/A;   CAROTID ENDARTERECTOMY  2002   "? side"   CATARACT EXTRACTION W/ INTRAOCULAR LENS IMPLANT     "? side"   CORONARY ANGIOPLASTY WITH STENT PLACEMENT  04/14/2017   CORONARY ANGIOPLASTY WITH STENT PLACEMENT  11/03/2014   SVG PVA   CORONARY ARTERY BYPASS GRAFT     with redo cabg   CORONARY STENT INTERVENTION N/A 10/18/2019   Procedure: CORONARY STENT INTERVENTION;  Surgeon: [name], MD;  Location: [facility];  Service: Cardiovascular;  Laterality: N/A;   CORONARY/GRAFT ANGIOGRAPHY N/A 10/18/2019  Procedure: CORONARY/GRAFT ANGIOGRAPHY;  Surgeon: [name], MD;  Location: [facility];  Service: Cardiovascular;  Laterality: N/A;   EP IMPLANTABLE DEVICE N/A 09/13/2014   Procedure: PPM Generator Changeout;  Surgeon: Deboraha Sprang, MD;  Location: [facility];  Service: Cardiovascular;  Laterality: N/A;   LEFT HEART CATH AND CORS/GRAFTS ANGIOGRAPHY N/A 04/14/2017   Procedure: LEFT HEART CATH AND CORS/GRAFTS ANGIOGRAPHY;  Surgeon: [name], MD;  Location: [facility];  Service: Cardiovascular;   Laterality: N/A;   MOHS SURGERY     "just outside my nose"   PACEMAKER INSERTION  2007   TONSILLECTOMY  1958    Current Outpatient Medications  Medication Sig Dispense Refill   allopurinol (ZYLOPRIM) 100 MG tablet Take 100 mg by mouth daily.      apixaban (ELIQUIS) 5 MG TABS tablet Take 1 tablet (5 mg total) by mouth 2 (two) times daily. 60 tablet 3   Bevacizumab (AVASTIN IV) Inject into the vein. Eye injections- once monthly in the right eye     ciclopirox (PENLAC) 8 % solution Apply topically at bedtime. Apply over nail and surrounding skin. Apply daily over previous coat. After seven (7) days, may remove with alcohol and continue cycle. 6.6 mL 0   clopidogrel (PLAVIX) 75 MG tablet Take 75 mg by mouth daily.     Evolocumab 140 MG/ML SOSY Inject 140 mg into the skin every 14 (fourteen) days.      fluticasone (FLONASE) 50 MCG/ACT nasal spray Place 2 sprays into both nostrils in the morning and at bedtime.     furosemide (LASIX) 80 MG tablet Take 1 tablet by mouth each morning and 1/2 tablet daily in the afternoon until fluid is resolved, then decrease to 1 tablet daily thereafter 90 tablet 3   isosorbide mononitrate (IMDUR) 120 MG 24 hr tablet TAKE 1 TABLET (120 MG TOTAL) BY MOUTH DAILY. 90 tablet 2   ketoconazole (NIZORAL) 2 % cream      losartan (COZAAR) 100 MG tablet Take 1 tablet (100 mg total) by mouth daily. 90 tablet 3   metoprolol succinate (TOPROL-XL) 50 MG 24 hr tablet TAKE 1 TABLET TWO TIMES DAILY. TAKE WITH OR IMMEDIATELY FOLLOWING MEALS. 180 tablet 1   nitroGLYCERIN (NITROSTAT) 0.4 MG SL tablet PLACE 1 TABLET UNDER THE TONGUE EVERY 5 MINUTES AS NEEDED FOR CHEST PAIN 25 tablet 3   ranolazine (RANEXA) 1000 MG SR tablet Take 1 tablet (1,000 mg total) by mouth 2 (two) times daily. 180 tablet 3   tamsulosin (FLOMAX) 0.4 MG CAPS Take 0.4 mg by mouth daily.     No current facility-administered medications for this encounter.    No Known Allergies  Social History   Socioeconomic  History   Marital status: Married    Spouse name: Not on file   Number of children: Not on file   Years of education: Not on file   Highest education level: Not on file  Occupational History   Not on file  Tobacco Use   Smoking status: Never    Passive exposure: Yes   Smokeless tobacco: Never  Vaping Use   Vaping Use: Never used  Substance and Sexual Activity   Alcohol use: Yes    Alcohol/week: 3.0 standard drinks    Types: 1 Glasses of wine, 1 Cans of beer, 1 Standard drinks or equivalent per week    Comment: \   Drug use: No   Sexual activity: Not Currently  Other Topics Concern  Not on file  Social History Narrative   Not on file   Social Determinants of Health   Financial Resource Strain: Not on file  Food Insecurity: Not on file  Transportation Needs: Not on file  Physical Activity: Not on file  Stress: Not on file  Social Connections: Not on file  Intimate Partner Violence: Not on file     ROS- All systems are reviewed and negative except as per the HPI above.  Physical Exam: Vitals:   06/24/21 1056  BP: (!) 146/74  Pulse: 60  Weight: 76.4 kg  Height: 5\' 8"  (1.727 m)    GEN- The patient is a well appearing elderly male, alert and oriented x 3 today.   Head- normocephalic, atraumatic Eyes-  Sclera clear, conjunctiva pink Ears- hearing intact Oropharynx- clear Neck- supple  Lungs- Clear to ausculation bilaterally, normal work of breathing Heart- Regular rate and rhythm, no rubs or gallops, 2/6 systolic murmur  GI- soft, NT, ND, + BS Extremities- no clubbing, cyanosis, or edema MS- no significant deformity or atrophy Skin- no rash or lesion Psych- euthymic mood, full affect Neuro- strength and sensation are intact  Wt Readings from Last 3 Encounters:  06/24/21 76.4 kg  06/05/21 78.7 kg  04/25/21 78.3 kg    EKG today demonstrates  V pacing with underlying afib Vent. rate 60 BPM PR interval * ms QRS duration 210 ms QT/QTcB 500/500  ms  Echo 08/19/19 demonstrated   1. Left ventricular ejection fraction, by estimation, is 45 to 50%. The  left ventricle has mildly decreased function. The left ventricle has no  regional wall motion abnormalities. Left ventricular diastolic parameters are consistent with Grade II diastolic dysfunction (pseudonormalization). Elevated left atrial pressure. There is severe hypokinesis of the left ventricular, basal-mid inferolateral wall.   2. Right ventricular systolic function is normal. The right ventricular  size is mildly enlarged. There is mildly elevated pulmonary artery  systolic pressure. The estimated right ventricular systolic pressure is  32.2 mmHg.   3. Left atrial size was moderately dilated.   4. Right atrial size was moderately dilated.   5. The mitral valve is normal in structure. Mild mitral valve  regurgitation. No evidence of mitral stenosis.   6. The aortic valve is normal in structure. Aortic valve regurgitation is trivial. No aortic stenosis is present.   7. The inferior vena cava is normal in size with greater than 50%  respiratory variability, suggesting right atrial pressure of 3 mmHg.   Comparison(s): Prior images reviewed side by side. The left ventricular function is unchanged. The left ventricular wall motion abnormality is unchanged.   Epic records are reviewed at length today  CHA2DS2-VASc Score = 5  [name] score is based upon: CHF History: 1 HTN History: 1 Diabetes History: 0 Stroke History: 0 Vascular Disease History: 1 Age Score: 2 Gender Score: 0       ASSESSMENT AND PLAN: 1. Persistent Atrial Fibrillation (ICD10:  I48.19) [name] CHA2DS2-VASc score is 5, indicating a 7.2% annual risk of stroke.   General education about afib provided and questions answered. We also discussed his stroke risk and the risks and benefits of anticoagulation. Start Eliquis 5 mg BID. D/w Dr Burt Knack regarding ASA and Plavix, will stop ASA and continue  Plavix. Will arrange for DCCV after 3 weeks of anticoagulation.  Continue Toprol 50 mg BID  2. Secondary Hypercoagulable State (ICD10:  D68.69) The patient is at significant risk for stroke/thromboembolism based upon his CHA2DS2-VASc Score of 5.  Start Apixaban (Eliquis).   3. CAD S/p CABG and multiple PCIs On Repatha  Followed by Dr Burt Knack  4. Snoring/daytime somnolence  The importance of adequate treatment of sleep apnea was discussed today in order to improve our ability to maintain sinus rhythm long term. Patient has deferred sleep study for now.   5. Chronic combined systolic and diastolic CHF No signs or symptoms of fluid overload today.  6. HTN Stable, no changes today.   Follow up with Dr Burt Knack as scheduled. AF clinic post DCCV.    [facility] [address] [place], [place] [address] [phone] 06/24/2021 12:01 PM

## 2021-06-24 NOTE — Patient Instructions (Signed)
STOP aspirin  START Eliquis 5mg  TWICE A DAY  Stop by the lab after your appointment with Dr. Burt Knack at [facility] for pre-procedure labs  Cardioversion scheduled for Wednesday, March 22nd  - Arrive at the [facility] and go to admitting at [facility] not eat or drink anything after midnight the night prior to your procedure.  - Take all your morning medication (except diabetic medications) with a sip of water prior to arrival.  - You will not be able to drive home after your procedure.  - Do NOT miss any doses of your blood thinner - if you should miss a dose please notify our office immediately.  - If you feel as if you go back into normal rhythm prior to scheduled cardioversion, please notify our office immediately. If your procedure is canceled in the cardioversion suite you will be charged a cancellation fee.

## 2021-06-25 ENCOUNTER — Encounter: Payer: Self-pay | Admitting: Internal Medicine

## 2021-06-26 NOTE — Progress Notes (Signed)
Remote pacemaker transmission.   

## 2021-06-30 ENCOUNTER — Encounter: Payer: Self-pay | Admitting: Internal Medicine

## 2021-07-03 ENCOUNTER — Encounter: Payer: Self-pay | Admitting: Internal Medicine

## 2021-07-03 NOTE — Telephone Encounter (Signed)
Patient called to discuss chest pain complaint. Patient reports chest pain he has had for years. Noted in Dr. York Cerise note 06/05/2021, patient has hx of chronic angina. States his main concern is lack of energy when walking up stairs. Explained to patient that when in AF often times people do no feel well or have much energy. Patient is scheduled for DCCV 07/17/21. Complaint with Merlin on file. Patient advised to call with further questions or concerns.  ?

## 2021-07-05 ENCOUNTER — Encounter: Payer: Self-pay | Admitting: Pulmonary Disease

## 2021-07-05 ENCOUNTER — Encounter: Payer: Self-pay | Admitting: Internal Medicine

## 2021-07-05 ENCOUNTER — Other Ambulatory Visit: Payer: Self-pay

## 2021-07-05 ENCOUNTER — Ambulatory Visit: Payer: Medicare HMO | Admitting: Pulmonary Disease

## 2021-07-05 VITALS — BP 136/68 | HR 60 | Temp 98.5°F | Ht 69.0 in | Wt 171.8 lb

## 2021-07-05 DIAGNOSIS — I5023 Acute on chronic systolic (congestive) heart failure: Secondary | ICD-10-CM

## 2021-07-05 DIAGNOSIS — R053 Chronic cough: Secondary | ICD-10-CM

## 2021-07-05 NOTE — Progress Notes (Signed)
Patient ID: Kyle Avila, male    DOB: 09/10/1945, 76 y.o.   MRN: [id number]  Chief Complaint  Patient presents with   Follow-up    Here for nucala start up. Got shipment early.     Referring provider: [name], MD  HPI:   Kyle Avila is a 76 y.o. with CAD s/p CABG x 2 (1988 and 1996) and multiple PCIs, chronic exertional angina who we are seeing in follow up of chronic cough.  Most recent cardiology note x3 reviewed.  Since last visit has trialed prednisone x2 for cough.  Completely Cough for few weeks.  Went skiing in [place] a few weeks ago.  Unfortunately developed worsening swelling and acute decompensated heart failure.  Was admitted to the hospital diuresed.  Is having ongoing follow-up with cardiology for this.  Feels like most fluid down to still taking diuretics obviously.  He brought the [employer] today.  Discussed we cannot give this today given time constraints, need for observation.  Discussed with pharmacist, scheduled for Monday, 3 days from today.  HPI at initial visit: Cough present for ~4 years. Cough is severe. First noticed in fall and spring but now present year round. No clear exacerbating factors. Not worse in morning or at night or when supine. Constant throughout the day. Not worse when eating. Occasionally worse when eating dry crackers. Cough improves for weeks at a time after prednisone, usually 10 mg daily x 7 days. Most recent course was ~3 weeks ago.  Trialed flonase for post nasal drip and does not think it improved. Denies heartburn or regurgitation of food. Trialed once a day PPI for "months" without improved cough. Notes when first started his sputum was foul smelling after sneezing. This resolved months to years ago. Endorses occasional sinus pressure that comes and goes. Unsure if cough is worse with pressure present or if prednisone improves sinus pressure. Denies post nasal drip or watery eyes. Does not think cough varies with seasons. No h/o  asthma as child or younger adult. Denies DOE but notes significant issues with angina.   Had CXR per PCP for this cough but notes its been at least a couple of years. I can not acces this image. CXR reviewed 12/2005 with new R sided pacemaker placement, lungs clear, no evidence of hyperinflation on my interpretation.   PMH:  Smoker/ Smoking History: never smoker  Social History: Never smoker. Married. Retired from [employer].  Surgical history: CABG x 2, multiple PCIs  Family history: Father with heart attack in 5s, mother with cancer  PFT: 11/2019 reviewed and interpreted as normal spirometry, no bronchodilator response, lung volumes within normal limits, corrected DLCO within normal limits-normal PFTs  Lab Results: Personally reviewed, abs eos 300  CBC    Component Value Date/Time   WBC 7.6 05/02/2021 1537   RBC 3.63 (L) 05/02/2021 1537   HGB 12.7 (L) 05/02/2021 1537   HGB 12.6 (L) 11/01/2019 1627   HCT 38.1 (L) 05/02/2021 1537   HCT 38.3 11/01/2019 1627   PLT 173.0 05/02/2021 1537   PLT 165 11/01/2019 1627   MCV 104.9 (H) 05/02/2021 1537   MCV 108 (H) 11/01/2019 1627   MCH 35.4 (H) 11/01/2019 1627   MCH 35.6 (H) 10/19/2019 1210   MCHC 33.4 05/02/2021 1537   RDW 13.2 05/02/2021 1537   RDW 12.4 11/01/2019 1627   LYMPHSABS 0.7 05/02/2021 1537   LYMPHSABS 1.1 10/14/2019 0900   MONOABS 0.2 05/02/2021 1537   EOSABS 0.0 05/02/2021 1537  EOSABS 0.3 10/14/2019 0900   BASOSABS 0.0 05/02/2021 1537   BASOSABS 0.0 10/14/2019 0900    BMET    Component Value Date/Time   NA 139 11/15/2019 1548   K 5.1 11/15/2019 1548   CL 100 11/15/2019 1548   CO2 25 11/15/2019 1548   GLUCOSE 106 (H) 11/15/2019 1548   GLUCOSE 104 (H) 10/19/2019 0306   BUN 48 (H) 11/15/2019 1548   CREATININE 2.67 (H) 11/15/2019 1548   CALCIUM 9.0 11/15/2019 1548   GFRNONAA 23 (L) 11/15/2019 1548   GFRAA 26 (L) 11/15/2019 1548    BNP No results found for: BNP  ProBNP No results found for: PROBNP    Imaging: Personally reviewed and as interpretation in HPI above.  Specialty Problems       Pulmonary Problems   Chronic pansinusitis   Chronic cough    No Known Allergies  Immunization History  Administered Date(s) Administered   DTaP, 5 pertussis antigens 08/29/2016   Influenza Split 01/27/2011, 01/27/2012, 01/10/2013, 07/27/2013   Influenza, High Dose Seasonal PF 01/31/2019   Influenza, Quadrivalent, Recombinant, Inj, Pf 02/04/2019, 02/04/2020   Influenza-Unspecified 01/10/2021   PFIZER(Purple Top)SARS-COV-2 Vaccination 05/11/2019, 06/02/2019, 06/27/2019, 02/27/2020, 08/05/2020   Pneumococcal Conjugate-13 08/09/2014   Pneumococcal Polysaccharide-23 02/27/2007   Zoster, Live 07/27/2013    Past Medical History:  Diagnosis Date   Arthritis    "some in my back" (04/14/2017)   CAD (coronary artery disease)    a.  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    apixaban (ELIQUIS) 5 MG TABS tablet Take 1 tablet (5 mg total) by mouth 2 (two) times daily.   Bevacizumab (AVASTIN IV) Inject into the vein. Eye injections- once monthly in the right eye   ciclopirox  (PENLAC) 8 % solution Apply topically at bedtime. Apply over nail and surrounding skin. Apply daily over previous coat. After seven (7) days, may remove with alcohol and continue cycle.   clopidogrel (PLAVIX) 75 MG tablet Take 75 mg by mouth daily.   Evolocumab 140 MG/ML SOSY Inject 140 mg into the skin every 14 (fourteen) days.    fluticasone (FLONASE) 50 MCG/ACT nasal spray Place 2 sprays into both nostrils in the morning and at bedtime.   furosemide (LASIX) 80 MG tablet Take 1 tablet by mouth each morning and 1/2 tablet daily in the afternoon until fluid is resolved, then decrease to 1 tablet daily thereafter   isosorbide mononitrate (IMDUR) 120 MG 24 hr tablet TAKE 1 TABLET (120 MG TOTAL) BY MOUTH DAILY.   ketoconazole (NIZORAL)  2 % cream    losartan (COZAAR) 100 MG tablet Take 1 tablet (100 mg total) by mouth daily.   metoprolol succinate (TOPROL-XL) 50 MG 24 hr tablet TAKE 1 TABLET TWO TIMES DAILY. TAKE WITH OR IMMEDIATELY FOLLOWING MEALS.   nitroGLYCERIN (NITROSTAT) 0.4 MG SL tablet PLACE 1 TABLET UNDER THE TONGUE EVERY 5 MINUTES AS NEEDED FOR CHEST PAIN   tamsulosin (FLOMAX) 0.4 MG CAPS Take 0.4 mg by mouth daily.   ranolazine (RANEXA) 1000 MG SR tablet Take 1 tablet (1,000 mg total) by mouth 2 (two) times daily.   No facility-administered encounter medications on file as of 07/05/2021.     Review of Systems  Review of Systems  N/a  Physical Exam  BP 136/68 (BP Location: Left Arm, Patient Position: Sitting, Cuff Size: Normal)    Pulse 60    Temp 98.5 F (36.9 C) (Oral)    Ht '5\' 9"'$  (1.753 m)    Wt 171 lb 12.8 oz (77.9 kg)    SpO2 98%    BMI 25.37 kg/m   Wt Readings from Last 5 Encounters:  07/05/21 171 lb 12.8 oz (77.9 kg)  06/24/21 168 lb 6.4 oz (76.4 kg)  06/05/21 173 lb 6.4 oz (78.7 kg)  04/25/21 172 lb 9.6 oz (78.3 kg)  04/10/21 175 lb 3.2 oz (79.5 kg)    BMI Readings from Last 5 Encounters:  07/05/21 25.37 kg/m  06/24/21 25.61 kg/m  06/05/21 26.37 kg/m  04/25/21  25.49 kg/m  04/10/21 26.64 kg/m     Physical Exam General: well appearing, no acute distress Eyes: EOMI, no icterus Respiratory: NWOB, lungs CTAB, no wheeze Cardiovascular: RRR, no murmurs, no gallops Skin: Warm, no rash, no lesions Neurologic: normal gait, CN intact Psych: Normal mood, full affect   Assessment & Plan:   Mr. Mckimmy is a 76 y.o. with CAD s/p CABG x 2 (1988 and 1996) and multiple PCIs, chronic exertional angina who presents with chief comlpaint of chronic cough.   Differential for chronic cough includes silent reflux, chronic sinusitis and post nasal drip, cough variant asthma, and eosinophilic bronchitis. He is a never smoker.  He has trialed PPI without improvement and denies other GERD symptoms making GERD less likely. He improves with prednisone. High suspicion for cough variant asthma or eosinophilic bronchitis. Notably abs eos 300 on CBC 09/2019.  However, no response, no improvement in cough with mid dose ICS/LABA therapy via [place].  Chest x-ray clear.  PFTs within normal limits.  Nucala approved, plan first injection 07/08/2021.  Instructed will require intensive drug monitoring for first injection as well as need to bring EpiPen to first injection.   Return in about 3 months (around 10/05/2021).   Kyle Clam, MD 07/05/2021   This appointment required 42 minutes of patient care (this includes precharting, chart review, review of results, face-to-face care, etc.).

## 2021-07-05 NOTE — Patient Instructions (Addendum)
Nice to see you again ? ?We scheduled you with our pharmacist for Nucala injection Monday 3/13 at 120 pm. Bring epipen and nucala injection. Be prepared to stay for 2-3 hours.  ? ?Return to clinic in 3 months or sooner as needed ?

## 2021-07-08 ENCOUNTER — Ambulatory Visit (INDEPENDENT_AMBULATORY_CARE_PROVIDER_SITE_OTHER): Payer: Medicare HMO | Admitting: Pharmacist

## 2021-07-08 ENCOUNTER — Encounter: Payer: Self-pay | Admitting: Cardiovascular Disease

## 2021-07-08 ENCOUNTER — Other Ambulatory Visit (HOSPITAL_COMMUNITY): Payer: Self-pay | Admitting: *Deleted

## 2021-07-08 ENCOUNTER — Other Ambulatory Visit: Payer: Self-pay

## 2021-07-08 ENCOUNTER — Encounter: Payer: Self-pay | Admitting: Pharmacist

## 2021-07-08 DIAGNOSIS — J8283 Eosinophilic asthma: Secondary | ICD-10-CM

## 2021-07-08 DIAGNOSIS — Z7189 Other specified counseling: Secondary | ICD-10-CM

## 2021-07-08 MED ORDER — APIXABAN 5 MG PO TABS: 5.0000 mg | ORAL_TABLET | Freq: Two times a day (BID) | ORAL | 2 refills | Status: DC

## 2021-07-08 MED ORDER — NUCALA 100 MG/ML ~~LOC~~ SOAJ: 100.0000 mg | SUBCUTANEOUS | 1 refills | Status: DC

## 2021-07-08 NOTE — Progress Notes (Signed)
? ?HPI ?Patient presents today to [facility] to see pharmacy team for [facility] new start.  Past medical history includes eosinophilic, cough-variant asthma. Patient had skiing trip in [place] that led to CHF exacerbation. ? ?Last seen by Dr. Silas Flood on 07/05/21. Patient has steroid-responsive chronic cough. ? ?Has had multiple prednisone tapers in the past years. He did not response to mid-dose ICS/LABA (Wixela) ? ?He brought Nucala pen today which he states he placed back in refrigerator after his office visit last week. ? ?Respiratory Medications ?He tried Wixela in the past and was not responsive. Patient is not currently taking maintenance inhalers which may prevent renewal in the future. ? ?Patient reports no known adherence challenges ? ?OBJECTIVE ?No Known Allergies ? ?Outpatient Encounter Medications as of 07/08/2021  ?Medication Sig  ? allopurinol (ZYLOPRIM) 100 MG tablet Take 100 mg by mouth daily.   ? apixaban (ELIQUIS) 5 MG TABS tablet Take 1 tablet (5 mg total) by mouth 2 (two) times daily.  ? Bevacizumab (AVASTIN IV) Inject into the vein. Eye injections- once monthly in the right eye  ? ciclopirox (PENLAC) 8 % solution Apply topically at bedtime. Apply over nail and surrounding skin. Apply daily over previous coat. After seven (7) days, may remove with alcohol and continue cycle.  ? clopidogrel (PLAVIX) 75 MG tablet Take 75 mg by mouth daily.  ? Evolocumab 140 MG/ML SOSY Inject 140 mg into the skin every 14 (fourteen) days.   ? fluticasone (FLONASE) 50 MCG/ACT nasal spray Place 2 sprays into both nostrils in the morning and at bedtime.  ? furosemide (LASIX) 80 MG tablet Take 1 tablet by mouth each morning and 1/2 tablet daily in the afternoon until fluid is resolved, then decrease to 1 tablet daily thereafter  ? isosorbide mononitrate (IMDUR) 120 MG 24 hr tablet TAKE 1 TABLET (120 MG TOTAL) BY MOUTH DAILY.  ? ketoconazole (NIZORAL) 2 % cream   ? losartan (COZAAR) 100 MG tablet Take 1 tablet (100 mg  total) by mouth daily.  ? metoprolol succinate (TOPROL-XL) 50 MG 24 hr tablet TAKE 1 TABLET TWO TIMES DAILY. TAKE WITH OR IMMEDIATELY FOLLOWING MEALS.  ? nitroGLYCERIN (NITROSTAT) 0.4 MG SL tablet PLACE 1 TABLET UNDER THE TONGUE EVERY 5 MINUTES AS NEEDED FOR CHEST PAIN  ? ranolazine (RANEXA) 1000 MG SR tablet Take 1 tablet (1,000 mg total) by mouth 2 (two) times daily.  ? tamsulosin (FLOMAX) 0.4 MG CAPS Take 0.4 mg by mouth daily.  ? ?No facility-administered encounter medications on file as of 07/08/2021.  ?  ? ?Immunization History  ?Administered Date(s) Administered  ? DTaP, 5 pertussis antigens 08/29/2016  ? Influenza Split 01/27/2011, 01/27/2012, 01/10/2013, 07/27/2013  ? Influenza, High Dose Seasonal PF 01/31/2019  ? Influenza, Quadrivalent, Recombinant, Inj, Pf 02/04/2019, 02/04/2020  ? Influenza-Unspecified 01/10/2021  ? PFIZER(Purple Top)SARS-COV-2 Vaccination 05/11/2019, 06/02/2019, 06/27/2019, 02/27/2020, 08/05/2020  ? Pneumococcal Conjugate-13 08/09/2014  ? Pneumococcal Polysaccharide-23 02/27/2007  ? Zoster, Live 07/27/2013  ?  ? ?PFTs ?PFT Results Latest Ref Rng & Units 11/30/2019  ?FVC-Pre L 3.58  ?FVC-Predicted Pre % 92  ?FVC-Post L 3.38  ?FVC-Predicted Post % 87  ?Pre FEV1/FVC % % 79  ?Post FEV1/FCV % % 84  ?FEV1-Pre L 2.85  ?FEV1-Predicted Pre % 101  ?FEV1-Post L 2.85  ?DLCO uncorrected ml/min/mmHg 17.75  ?DLCO UNC% % 75  ?DLCO corrected ml/min/mmHg 18.91  ?DLCO COR %Predicted % 80  ?DLVA Predicted % 83  ?TLC L 5.77  ?TLC % Predicted % 88  ?RV % Predicted %  85  ?  ?Eosinophils ?Most recent blood eosinophil count was <50 cells/microL taken on 05/02/21, then >200 on 05/01/21 (with PCP) ? ?Assessment  ? ?Biologics training for mepolizumab Geradine Girt) ? ?Goals of therapy: ?Mechanism of Action: Not fully understood. It does act an interleukin-5 (IL-5) antagonist monoclonal antibody that reduces the production and survival of eosinophils by blocking the binding of IL-5 to the alpha chain of the receptor complex on  the eosinophil cell surface. ?Reviewed that Nucala is add-on medication and patient must continue maintenance inhaler regimen. ?Response to therapy: may take 3 months to 6 months to determine efficacy. Discussed that patients generally feel improvement sooner than 3 months. ? ?Side effects: headache (19%), injection site reaction (7-15%), antibody development (6%), backache (5%), fatigue (5%) ? ?Dose: 100 mg subcutaneously every 4 weeks ? ?Administration/Storage:  ?Reviewed administration sites of thigh or abdomen (at least 2-3 inches away from abdomen). Reviewed the upper arm is only appropriate if caregiver is administering injection  ?Do not shake the reconstituted solution as this could lead to product foaming or precipitation. ?Solution should be clear to opalescent and colorless to pale yellow or pale brown, essentially particle free. Small air bubbles, however, are expected and acceptable. If particulate matter remains in the solution or if the solution appears cloudy or milky, discard the solution.  ?Reviewed storage of medication in refrigerator. Reviewed that Nucala can be stored at room temperature in unopened carton for up to 7 days. ? ?Access: ?Approval of Nucala through: patient assistance through 04/27/22 ?Reviewed that he will have to re-enroll each calendar year ? ?Patient self-administered in right thigh using sample: ?Nucala '100mg'$ /mL autoinjector ?[facility]: [phone] ?Lot: [id number] ?Exp: 08/2021 ? ?Patient monitored for 30 min. Tolerated Nucala injection without issue. ? ?Medication Reconciliation ? ?A drug regimen assessment was performed, including review of allergies, interactions, disease-state management, dosing and immunization history. Medications were reviewed with the patient, including name, instructions, indication, goals of therapy, potential side effects, importance of adherence, and safe use. ? ?Drug interaction(s): none noted ? ?Immunizations ? ?He is UTD on influenza vaccine, pneumonia  vaccine, zoster vaccine. ?Patient has received 4 COVID19 vaccines. ? ?PLAN ?Continue Nucala '100mg'$  every 4 weeks. Next dose is due 08/05/21 and every 4 weeks thereafter. Rx sent to: [facility]: [phone]. Patient provided with pharmacy phone number and advised to call to schedule shipment for next dose. ?He is not taking any maintenance inhaler ICS/LABA regimen ? ?All questions encouraged and answered.  Instructed patient to reach out with any further questions or concerns. ? ?Thank you for allowing pharmacy to participate in this patient's care. ? ?This appointment required 60 minutes of patient care (this includes precharting, chart review, review of results, face-to-face care, etc.). ? ?Knox Saliva, PharmD, MPH, BCPS ?Clinical Pharmacist (Rheumatology and Pulmonology) ?

## 2021-07-08 NOTE — Patient Instructions (Addendum)
Your next Nucala dose is due on 08/05/21, 09/02/21, and every 4 weeks thereafter ? ?Your prescription will be shipped from [facility]. Their phone number is [phone] Please call to schedule shipment and confirm address. They will mail your medication to your home.  ? ?The phone number for [facility] [place] [place] (patient assistance program) is [phone]. ? ?You will need to be seen by your provider in 3 to 4 months to assess how NUCALA is working for you. Please ensure you have a follow-up appointment scheduled in June/July 2023. Call our clinic if you need to make this appointment. ? ?How to manage an injection site reaction: ?Remember the 5 C's: ?COUNTER - leave on the counter at least 30 minutes but up to overnight to bring medication to room temperature. This may help prevent stinging ?COLD - place something cold (like an ice gel pack or cold water bottle) on the injection site just before cleansing with alcohol. This may help reduce pain ?CLARITIN - use Claritin (generic name is loratadine) for the first two weeks of treatment or the day of, the day before, and the day after injecting. This will help to minimize injection site reactions ?CORTISONE CREAM - apply if injection site is irritated and itching ?CALL ME - if injection site reaction is bigger than the size of your fist, looks infected, blisters, or if you develop hives ?

## 2021-07-09 ENCOUNTER — Encounter (HOSPITAL_COMMUNITY): Payer: Self-pay | Admitting: Internal Medicine

## 2021-07-10 ENCOUNTER — Telehealth: Payer: Self-pay | Admitting: Cardiovascular Disease

## 2021-07-10 NOTE — Telephone Encounter (Signed)
Patient wanted to make sure that Dr. Phineas Inches, the provider that will be doing his procedure next week, is in his insurance network. He does not want to risk not having the procedure covered.  ? ?Please advise  ?

## 2021-07-10 NOTE — Telephone Encounter (Signed)
Returned patient's call to advise that Dr. Beckie Busing is in his insurance network.  ?

## 2021-07-14 ENCOUNTER — Encounter: Payer: Self-pay | Admitting: Cardiovascular Disease

## 2021-07-15 ENCOUNTER — Other Ambulatory Visit: Payer: Medicare HMO | Admitting: *Deleted

## 2021-07-15 ENCOUNTER — Encounter: Payer: Self-pay | Admitting: Cardiovascular Disease

## 2021-07-15 ENCOUNTER — Telehealth (HOSPITAL_COMMUNITY): Payer: Self-pay

## 2021-07-15 ENCOUNTER — Telehealth: Payer: Self-pay

## 2021-07-15 ENCOUNTER — Other Ambulatory Visit: Payer: Self-pay

## 2021-07-15 ENCOUNTER — Ambulatory Visit: Payer: Medicare HMO | Admitting: Cardiovascular Disease

## 2021-07-15 VITALS — BP 130/70 | HR 65 | Ht 68.0 in | Wt 170.2 lb

## 2021-07-15 DIAGNOSIS — I5022 Chronic systolic (congestive) heart failure: Secondary | ICD-10-CM | POA: Diagnosis not present

## 2021-07-15 DIAGNOSIS — I25119 Atherosclerotic heart disease of native coronary artery with unspecified angina pectoris: Secondary | ICD-10-CM | POA: Diagnosis not present

## 2021-07-15 DIAGNOSIS — E782 Mixed hyperlipidemia: Secondary | ICD-10-CM

## 2021-07-15 DIAGNOSIS — Z950 Presence of cardiac pacemaker: Secondary | ICD-10-CM | POA: Diagnosis not present

## 2021-07-15 DIAGNOSIS — I10 Essential (primary) hypertension: Secondary | ICD-10-CM | POA: Diagnosis not present

## 2021-07-15 DIAGNOSIS — I4891 Unspecified atrial fibrillation: Secondary | ICD-10-CM | POA: Diagnosis not present

## 2021-07-15 DIAGNOSIS — I4819 Other persistent atrial fibrillation: Secondary | ICD-10-CM

## 2021-07-15 DIAGNOSIS — I739 Peripheral vascular disease, unspecified: Secondary | ICD-10-CM | POA: Diagnosis not present

## 2021-07-15 DIAGNOSIS — I2510 Atherosclerotic heart disease of native coronary artery without angina pectoris: Secondary | ICD-10-CM | POA: Diagnosis not present

## 2021-07-15 DIAGNOSIS — I255 Ischemic cardiomyopathy: Secondary | ICD-10-CM | POA: Diagnosis not present

## 2021-07-15 DIAGNOSIS — I130 Hypertensive heart and chronic kidney disease with heart failure and stage 1 through stage 4 chronic kidney disease, or unspecified chronic kidney disease: Secondary | ICD-10-CM | POA: Diagnosis not present

## 2021-07-15 DIAGNOSIS — Z7901 Long term (current) use of anticoagulants: Secondary | ICD-10-CM | POA: Diagnosis not present

## 2021-07-15 DIAGNOSIS — N184 Chronic kidney disease, stage 4 (severe): Secondary | ICD-10-CM | POA: Diagnosis not present

## 2021-07-15 DIAGNOSIS — I252 Old myocardial infarction: Secondary | ICD-10-CM | POA: Diagnosis not present

## 2021-07-15 DIAGNOSIS — Z951 Presence of aortocoronary bypass graft: Secondary | ICD-10-CM | POA: Diagnosis not present

## 2021-07-15 DIAGNOSIS — I5040 Unspecified combined systolic (congestive) and diastolic (congestive) heart failure: Secondary | ICD-10-CM | POA: Diagnosis not present

## 2021-07-15 LAB — BASIC METABOLIC PANEL
Anion gap: 9 (ref 5–15)
BUN: 70 mg/dL — ABNORMAL HIGH (ref 8–23)
CO2: 29 mmol/L (ref 22–32)
Calcium: 9.2 mg/dL (ref 8.9–10.3)
Chloride: 100 mmol/L (ref 98–111)
Creatinine, Ser: 2.84 mg/dL — ABNORMAL HIGH (ref 0.61–1.24)
GFR, Estimated: 22 mL/min — ABNORMAL LOW (ref >60)
Glucose, Bld: 115 mg/dL — ABNORMAL HIGH (ref 70–99)
Potassium: 4.8 mmol/L (ref 3.5–5.1)
Sodium: 138 mmol/L (ref 135–145)

## 2021-07-15 LAB — CBC
HCT: 40.1 % (ref 39.0–52.0)
Hemoglobin: 13.4 g/dL (ref 13.0–17.0)
MCH: 36.4 pg — ABNORMAL HIGH (ref 26.0–34.0)
MCHC: 33.4 g/dL (ref 30.0–36.0)
MCV: 109 fL — ABNORMAL HIGH (ref 80.0–100.0)
Platelets: 199 10*3/uL (ref 150–400)
RBC: 3.68 MIL/uL — ABNORMAL LOW (ref 4.22–5.81)
RDW: 13.7 % (ref 11.5–15.5)
WBC: 3.4 10*3/uL — ABNORMAL LOW (ref 4.0–10.5)
nRBC: 0 % (ref 0.0–0.2)

## 2021-07-15 NOTE — Telephone Encounter (Signed)
Patient called in stating that he sent in a transmission and would like someone to call him back to go over it with him. Patient has questions about what rate does his ppm kick in. I let patient know that a nurse will call him back. Patient does not have any symptoms at this time  ?

## 2021-07-15 NOTE — Patient Instructions (Signed)
Medication Instructions:  ?Your physician recommends that you continue on your current medications as directed. Please refer to the Current Medication list given to you today. ? ?*If you need a refill on your cardiac medications before your next appointment, please call your pharmacy* ? ? ?Lab Work: ?NONE ?If you have labs (blood work) drawn today and your tests are completely normal, you will receive your results only by: ?MyChart Message (if you have MyChart) OR ?A paper copy in the mail ?If you have any lab test that is abnormal or we need to change your treatment, we will call you to review the results. ? ? ?Testing/Procedures: ?NONE ? ? ?Follow-Up: ?At [facility], you and your health needs are our priority.  As part of our continuing mission to provide you with exceptional heart care, we have created designated Provider Care Teams.  These Care Teams include your primary Cardiologist (physician) and Advanced Practice Providers (APPs -  Physician Assistants and Nurse Practitioners) who all work together to provide you with the care you need, when you need it. ? ?Your next appointment:   ?6 month(s) ? ?The format for your next appointment:   ?In Person ? ?Provider:   ?Michael Cooper, MD   ?  ?

## 2021-07-15 NOTE — Telephone Encounter (Signed)
Returned patients phone call.  ? ? Patient had several questions. Answered questions to the best of my ability. ?

## 2021-07-15 NOTE — Telephone Encounter (Signed)
Pt insurance is active and benefits verified through [facility]. Co-pay $10.00, DED $0.00/$0.00 met, out of pocket $3,400.00/$1,678.30 met, co-insurance 0%. No pre-authorization required. Passport, 07/15/21 @ 10:50AM, KRC#[id number] ?  ?Will contact patient to see if he is interested in the Cardiac Rehab Program. ?

## 2021-07-15 NOTE — H&P (View-Only) (Signed)
?Cardiology Office Note:   ? ?Date:  07/16/2021  ? ?ID:  Kyle Avila, DOB 13-Nov-1945, MRN [id number] ? ?PCP:  [name], MD ?  ?[facility] Providers ?Cardiologist:  [name], MD ?Electrophysiologist:  [name], MD    ? ?Referring MD: [name], MD  ? ?Chief Complaint  ?Patient presents with  ? Atrial Fibrillation  ? ? ?History of Present Illness:   ? ?Kyle Avila is a 76 y.o. male with a hx of: ?Coronary artery disease  ?S/p CABG in 1988; redo in 1996 ?S/p multiple PCI procedures (DES x 2 to LCx in 2009, DES to S-PDA in 2016, DES x 2 to S-PDA in 2018) ?S/p DES to RPDA and cutting balloon POBA to S-RPDA (ISR) 6/21 ?Combined systolic and diastolic CHF ?Ischemic CM ?Echocardiogram 2/18: EF 45-50 ?Echocardiogram 4/21: EF 45-50 ?Carotid artery disease ?S/p R CEA  ?PPM 2007, Gen change 2016 ?Atrial fibrillation dx 2023 ? ?The patient is here alone today.  He was diagnosed with atrial fibrillation based on device monitoring which demonstrated persistent atrial fibrillation since May 26, 2021.  He has been seen in the atrial fibrillation clinic and is scheduled for elective cardioversion.  The patient is now anticoagulated on apixaban and has not missed any doses of his medication.  He feels very poorly in atrial fibrillation and complains of marked fatigue and shortness of breath with activity.  He has not had any change in his anginal symptoms and denies orthopnea, PND, or leg swelling.  No other complaints today.  No bleeding problems since starting apixaban. ? ?        ? ?Past Medical History:  ?Diagnosis Date  ? Arthritis   ? "some in my back" (04/14/2017)  ? CAD (coronary artery disease)   ? a.  s/p CABG 1988 with redo in 1996 with multiple PCIs since then  ? Carotid artery occlusion   ? s/p RCEA  ? CKD (chronic kidney disease), stage IV ([place])   ? Complete heart block (HCC)   ? s/p PTVDP  ? Gout   ? HTN (hypertension)   ? Hyperlipidemia   ? Lupus ([place])   ? "that's why my kidneys  are gone; lupus attacked them" (04/14/2017)  ? Mitral insufficiency   ? Myocardial infarction [facility])   ? "I've had some mild one" (04/14/2017)  ? Presence of permanent cardiac pacemaker   ? Skin cancer   ? "nose"  ? VT (ventricular tachycardia)-nonsustained 05/20/2011  ? ? ?Past Surgical History:  ?Procedure Laterality Date  ? ACHILLES TENDON REPAIR Right   ? CARDIAC CATHETERIZATION    ? CARDIAC CATHETERIZATION N/A 11/03/2014  ? Procedure: Left Heart Cath and Cors/Grafts Angiography;  Surgeon: [name], MD;  Location: [place] [facility];  Service: Cardiovascular;  Laterality: N/A;  ? CARDIAC CATHETERIZATION N/A 11/03/2014  ? Procedure: Coronary Stent Intervention;  Surgeon: [name], MD;  Location: [place] [facility];  Service: Cardiovascular;  Laterality: N/A;  ? CAROTID ENDARTERECTOMY  2002  ? "? side"  ? CATARACT EXTRACTION W/ INTRAOCULAR LENS IMPLANT    ? "? side"  ? CORONARY ANGIOPLASTY WITH STENT PLACEMENT  04/14/2017  ? CORONARY ANGIOPLASTY WITH STENT PLACEMENT  11/03/2014  ? SVG PVA  ? CORONARY ARTERY BYPASS GRAFT    ? with redo cabg  ? CORONARY STENT INTERVENTION N/A 10/18/2019  ? Procedure: CORONARY STENT INTERVENTION;  Surgeon: [name], MD;  Location: [place] [facility];  Service: Cardiovascular;  Laterality: N/A;  ? CORONARY/GRAFT ANGIOGRAPHY  N/A 10/18/2019  ? Procedure: CORONARY/GRAFT ANGIOGRAPHY;  Surgeon: [name], MD;  Location: [place] [facility];  Service: Cardiovascular;  Laterality: N/A;  ? EP IMPLANTABLE DEVICE N/A 09/13/2014  ? Procedure: PPM Generator Changeout;  Surgeon: Deboraha Sprang, MD;  Location: [place] [facility];  Service: Cardiovascular;  Laterality: N/A;  ? LEFT HEART CATH AND CORS/GRAFTS ANGIOGRAPHY N/A 04/14/2017  ? Procedure: LEFT HEART CATH AND CORS/GRAFTS ANGIOGRAPHY;  Surgeon: [name], MD;  Location: [place] [facility];  Service: Cardiovascular;  Laterality: N/A;  ? MOHS SURGERY    ? "just outside my nose"  ? PACEMAKER INSERTION  2007  ? TONSILLECTOMY   1958  ? ? ?Current Medications: ?Current Meds  ?Medication Sig  ? allopurinol (ZYLOPRIM) 100 MG tablet Take 100 mg by mouth at bedtime.  ? apixaban (ELIQUIS) 5 MG TABS tablet Take 1 tablet (5 mg total) by mouth 2 (two) times daily.  ? Bevacizumab (AVASTIN IV) Inject into the vein. Eye injections- once monthly in the right eye  ? ciclopirox (PENLAC) 8 % solution Apply topically at bedtime. Apply over nail and surrounding skin. Apply daily over previous coat. After seven (7) days, may remove with alcohol and continue cycle.  ? clopidogrel (PLAVIX) 75 MG tablet Take 75 mg by mouth at bedtime.  ? fluticasone (FLONASE) 50 MCG/ACT nasal spray Place 2 sprays into both nostrils daily as needed for allergies or rhinitis.  ? furosemide (LASIX) 80 MG tablet Take 1 tablet by mouth each morning and 1/2 tablet daily in the afternoon until fluid is resolved, then decrease to 1 tablet daily thereafter (Patient taking differently: Take 40-80 mg by mouth See admin instructions. Take 1 tablet by mouth each morning. Take 1/2 tablet in the afternoon if needed for fluid.)  ? isosorbide mononitrate (IMDUR) 120 MG 24 hr tablet TAKE 1 TABLET (120 MG TOTAL) BY MOUTH DAILY.  ? ketoconazole (NIZORAL) 2 % cream Apply 1 application. topically daily as needed for irritation.  ? losartan (COZAAR) 100 MG tablet Take 1 tablet (100 mg total) by mouth daily.  ? Mepolizumab (NUCALA) 100 MG/ML SOAJ Inject 1 mL (100 mg total) into the skin every 28 (twenty-eight) days.  ? metoprolol succinate (TOPROL-XL) 50 MG 24 hr tablet TAKE 1 TABLET TWO TIMES DAILY. TAKE WITH OR IMMEDIATELY FOLLOWING MEALS.  ? nitroGLYCERIN (NITROSTAT) 0.4 MG SL tablet PLACE 1 TABLET UNDER THE TONGUE EVERY 5 MINUTES AS NEEDED FOR CHEST PAIN  ? REPATHA SURECLICK 481 MG/ML SOAJ Inject 140 mg into the skin every 14 (fourteen) days.  ? tamsulosin (FLOMAX) 0.4 MG CAPS Take 0.4 mg by mouth daily.  ?  ? ?Allergies:   Patient has no known allergies.  ? ?Social History  ? ?Socioeconomic  History  ? Marital status: Married  ?  Spouse name: Not on file  ? Number of children: Not on file  ? Years of education: Not on file  ? Highest education level: Not on file  ?Occupational History  ? Not on file  ?Tobacco Use  ? Smoking status: Never  ?  Passive exposure: Yes  ? Smokeless tobacco: Never  ?Vaping Use  ? Vaping Use: Never used  ?Substance and Sexual Activity  ? Alcohol use: Yes  ?  Alcohol/week: 3.0 standard drinks  ?  Types: 1 Glasses of wine, 1 Cans of beer, 1 Standard drinks or equivalent per week  ?  Comment: \  ? Drug use: No  ? Sexual activity: Not Currently  ?Other Topics Concern  ?  Not on file  ?Social History Narrative  ? Not on file  ? ?Social Determinants of Health  ? ?Financial Resource Strain: Not on file  ?Food Insecurity: Not on file  ?Transportation Needs: Not on file  ?Physical Activity: Not on file  ?Stress: Not on file  ?Social Connections: Not on file  ?  ? ?Family History: ?[name] family history includes Cancer in his mother; Heart attack in his father; Heart disease in his father. ? ?ROS:   ?Please see the history of present illness.    ?Positive for fatigue.  All other systems reviewed and are negative. ? ?EKGs/Labs/Other Studies Reviewed:   ? ?EKG:  EKG is not ordered today.   ? ?Recent Labs: ?07/15/2021: BUN 70; Creatinine, Ser 2.84; Hemoglobin 13.4; Platelets 199; Potassium 4.8; Sodium 138  ?Recent Lipid Panel ?   ?Component Value Date/Time  ? CHOL 133 10/28/2019 0820  ? TRIG 254 (H) 10/28/2019 0820  ? HDL 41 10/28/2019 0820  ? CHOLHDL 3.2 10/28/2019 0820  ? CHOLHDL 2.4 10/19/2019 0306  ? VLDL 55 (H) 10/19/2019 0306  ? [place] 52 10/28/2019 0820  ? LDLDIRECT 47 10/28/2019 0820  ? ? ? ?Risk Assessment/Calculations:   ? ?CHA2DS2-VASc Score = 5  ? This indicates a 7.2% annual risk of stroke. ?[name] score is based upon: ?CHF History: 1 ?HTN History: 1 ?Diabetes History: 0 ?Stroke History: 0 ?Vascular Disease History: 1 ?Age Score: 2 ?Gender Score: 0 ?  ? ? ?     ? ?Physical Exam:   ? ?VS:  BP 130/70   Pulse 65   Ht '5\' 8"'$  (1.727 m)   Wt 170 lb 3.2 oz (77.2 kg)   SpO2 97%   BMI 25.88 kg/m?    ? ?Wt Readings from Last 3 Encounters:  ?07/15/21 170 lb 3.2 oz (77.2 kg)  ?0

## 2021-07-15 NOTE — Telephone Encounter (Signed)
Called and spoke with pt in regards to CR, pt stated he would like to wait until next month to get schedule for CR. Explained scheduling process and went over insurance, patient verbalized understanding. ?

## 2021-07-15 NOTE — Progress Notes (Signed)
?Cardiology Office Note:   ? ?Date:  07/16/2021  ? ?ID:  Kyle Avila, DOB 1945-08-01, MRN [id number] ? ?PCP:  [name], MD ?  ?[facility] Providers ?Cardiologist:  [name], MD ?Electrophysiologist:  [name], MD    ? ?Referring MD: [name], MD  ? ?Chief Complaint  ?Patient presents with  ? Atrial Fibrillation  ? ? ?History of Present Illness:   ? ?Kyle Avila is a 76 y.o. male with a hx of: ?Coronary artery disease  ?S/p CABG in 1988; redo in 1996 ?S/p multiple PCI procedures (DES x 2 to LCx in 2009, DES to S-PDA in 2016, DES x 2 to S-PDA in 2018) ?S/p DES to RPDA and cutting balloon POBA to S-RPDA (ISR) 6/21 ?Combined systolic and diastolic CHF ?Ischemic CM ?Echocardiogram 2/18: EF 45-50 ?Echocardiogram 4/21: EF 45-50 ?Carotid artery disease ?S/p R CEA  ?PPM 2007, Gen change 2016 ?Atrial fibrillation dx 2023 ? ?The patient is here alone today.  He was diagnosed with atrial fibrillation based on device monitoring which demonstrated persistent atrial fibrillation since May 26, 2021.  He has been seen in the atrial fibrillation clinic and is scheduled for elective cardioversion.  The patient is now anticoagulated on apixaban and has not missed any doses of his medication.  He feels very poorly in atrial fibrillation and complains of marked fatigue and shortness of breath with activity.  He has not had any change in his anginal symptoms and denies orthopnea, PND, or leg swelling.  No other complaints today.  No bleeding problems since starting apixaban. ? ?        ? ?Past Medical History:  ?Diagnosis Date  ? Arthritis   ? "some in my back" (04/14/2017)  ? CAD (coronary artery disease)   ? a.  s/p CABG 1988 with redo in 1996 with multiple PCIs since then  ? Carotid artery occlusion   ? s/p RCEA  ? CKD (chronic kidney disease), stage IV ([place])   ? Complete heart block (HCC)   ? s/p PTVDP  ? Gout   ? HTN (hypertension)   ? Hyperlipidemia   ? Lupus ([place])   ? "that's why my kidneys  are gone; lupus attacked them" (04/14/2017)  ? Mitral insufficiency   ? Myocardial infarction [facility])   ? "I've had some mild one" (04/14/2017)  ? Presence of permanent cardiac pacemaker   ? Skin cancer   ? "nose"  ? VT (ventricular tachycardia)-nonsustained 05/20/2011  ? ? ?Past Surgical History:  ?Procedure Laterality Date  ? ACHILLES TENDON REPAIR Right   ? CARDIAC CATHETERIZATION    ? CARDIAC CATHETERIZATION N/A 11/03/2014  ? Procedure: Left Heart Cath and Cors/Grafts Angiography;  Surgeon: [name], MD;  Location: [place] [facility];  Service: Cardiovascular;  Laterality: N/A;  ? CARDIAC CATHETERIZATION N/A 11/03/2014  ? Procedure: Coronary Stent Intervention;  Surgeon: [name], MD;  Location: [place] [facility];  Service: Cardiovascular;  Laterality: N/A;  ? CAROTID ENDARTERECTOMY  2002  ? "? side"  ? CATARACT EXTRACTION W/ INTRAOCULAR LENS IMPLANT    ? "? side"  ? CORONARY ANGIOPLASTY WITH STENT PLACEMENT  04/14/2017  ? CORONARY ANGIOPLASTY WITH STENT PLACEMENT  11/03/2014  ? SVG PVA  ? CORONARY ARTERY BYPASS GRAFT    ? with redo cabg  ? CORONARY STENT INTERVENTION N/A 10/18/2019  ? Procedure: CORONARY STENT INTERVENTION;  Surgeon: [name], MD;  Location: [place] [facility];  Service: Cardiovascular;  Laterality: N/A;  ? CORONARY/GRAFT ANGIOGRAPHY  N/A 10/18/2019  ? Procedure: CORONARY/GRAFT ANGIOGRAPHY;  Surgeon: [name], MD;  Location: [place] [facility];  Service: Cardiovascular;  Laterality: N/A;  ? EP IMPLANTABLE DEVICE N/A 09/13/2014  ? Procedure: PPM Generator Changeout;  Surgeon: Deboraha Sprang, MD;  Location: [place] [facility];  Service: Cardiovascular;  Laterality: N/A;  ? LEFT HEART CATH AND CORS/GRAFTS ANGIOGRAPHY N/A 04/14/2017  ? Procedure: LEFT HEART CATH AND CORS/GRAFTS ANGIOGRAPHY;  Surgeon: [name], MD;  Location: [place] [facility];  Service: Cardiovascular;  Laterality: N/A;  ? MOHS SURGERY    ? "just outside my nose"  ? PACEMAKER INSERTION  2007  ? TONSILLECTOMY   1958  ? ? ?Current Medications: ?Current Meds  ?Medication Sig  ? allopurinol (ZYLOPRIM) 100 MG tablet Take 100 mg by mouth at bedtime.  ? apixaban (ELIQUIS) 5 MG TABS tablet Take 1 tablet (5 mg total) by mouth 2 (two) times daily.  ? Bevacizumab (AVASTIN IV) Inject into the vein. Eye injections- once monthly in the right eye  ? ciclopirox (PENLAC) 8 % solution Apply topically at bedtime. Apply over nail and surrounding skin. Apply daily over previous coat. After seven (7) days, may remove with alcohol and continue cycle.  ? clopidogrel (PLAVIX) 75 MG tablet Take 75 mg by mouth at bedtime.  ? fluticasone (FLONASE) 50 MCG/ACT nasal spray Place 2 sprays into both nostrils daily as needed for allergies or rhinitis.  ? furosemide (LASIX) 80 MG tablet Take 1 tablet by mouth each morning and 1/2 tablet daily in the afternoon until fluid is resolved, then decrease to 1 tablet daily thereafter (Patient taking differently: Take 40-80 mg by mouth See admin instructions. Take 1 tablet by mouth each morning. Take 1/2 tablet in the afternoon if needed for fluid.)  ? isosorbide mononitrate (IMDUR) 120 MG 24 hr tablet TAKE 1 TABLET (120 MG TOTAL) BY MOUTH DAILY.  ? ketoconazole (NIZORAL) 2 % cream Apply 1 application. topically daily as needed for irritation.  ? losartan (COZAAR) 100 MG tablet Take 1 tablet (100 mg total) by mouth daily.  ? Mepolizumab (NUCALA) 100 MG/ML SOAJ Inject 1 mL (100 mg total) into the skin every 28 (twenty-eight) days.  ? metoprolol succinate (TOPROL-XL) 50 MG 24 hr tablet TAKE 1 TABLET TWO TIMES DAILY. TAKE WITH OR IMMEDIATELY FOLLOWING MEALS.  ? nitroGLYCERIN (NITROSTAT) 0.4 MG SL tablet PLACE 1 TABLET UNDER THE TONGUE EVERY 5 MINUTES AS NEEDED FOR CHEST PAIN  ? REPATHA SURECLICK 270 MG/ML SOAJ Inject 140 mg into the skin every 14 (fourteen) days.  ? tamsulosin (FLOMAX) 0.4 MG CAPS Take 0.4 mg by mouth daily.  ?  ? ?Allergies:   Patient has no known allergies.  ? ?Social History  ? ?Socioeconomic  History  ? Marital status: Married  ?  Spouse name: Not on file  ? Number of children: Not on file  ? Years of education: Not on file  ? Highest education level: Not on file  ?Occupational History  ? Not on file  ?Tobacco Use  ? Smoking status: Never  ?  Passive exposure: Yes  ? Smokeless tobacco: Never  ?Vaping Use  ? Vaping Use: Never used  ?Substance and Sexual Activity  ? Alcohol use: Yes  ?  Alcohol/week: 3.0 standard drinks  ?  Types: 1 Glasses of wine, 1 Cans of beer, 1 Standard drinks or equivalent per week  ?  Comment: \  ? Drug use: No  ? Sexual activity: Not Currently  ?Other Topics Concern  ?  Not on file  ?Social History Narrative  ? Not on file  ? ?Social Determinants of Health  ? ?Financial Resource Strain: Not on file  ?Food Insecurity: Not on file  ?Transportation Needs: Not on file  ?Physical Activity: Not on file  ?Stress: Not on file  ?Social Connections: Not on file  ?  ? ?Family History: ?[name] family history includes Cancer in his mother; Heart attack in his father; Heart disease in his father. ? ?ROS:   ?Please see the history of present illness.    ?Positive for fatigue.  All other systems reviewed and are negative. ? ?EKGs/Labs/Other Studies Reviewed:   ? ?EKG:  EKG is not ordered today.   ? ?Recent Labs: ?07/15/2021: BUN 70; Creatinine, Ser 2.84; Hemoglobin 13.4; Platelets 199; Potassium 4.8; Sodium 138  ?Recent Lipid Panel ?   ?Component Value Date/Time  ? CHOL 133 10/28/2019 0820  ? TRIG 254 (H) 10/28/2019 0820  ? HDL 41 10/28/2019 0820  ? CHOLHDL 3.2 10/28/2019 0820  ? CHOLHDL 2.4 10/19/2019 0306  ? VLDL 55 (H) 10/19/2019 0306  ? [place] 52 10/28/2019 0820  ? LDLDIRECT 47 10/28/2019 0820  ? ? ? ?Risk Assessment/Calculations:   ? ?CHA2DS2-VASc Score = 5  ? This indicates a 7.2% annual risk of stroke. ?[name] score is based upon: ?CHF History: 1 ?HTN History: 1 ?Diabetes History: 0 ?Stroke History: 0 ?Vascular Disease History: 1 ?Age Score: 2 ?Gender Score: 0 ?  ? ? ?     ? ?Physical Exam:   ? ?VS:  BP 130/70   Pulse 65   Ht '5\' 8"'$  (1.727 m)   Wt 170 lb 3.2 oz (77.2 kg)   SpO2 97%   BMI 25.88 kg/m?    ? ?Wt Readings from Last 3 Encounters:  ?07/15/21 170 lb 3.2 oz (77.2 kg)  ?0

## 2021-07-17 ENCOUNTER — Ambulatory Visit (HOSPITAL_COMMUNITY)
Admission: RE | Admit: 2021-07-17 | Discharge: 2021-07-17 | Disposition: A | Discharge status: Home / Self Care | Payer: Medicare HMO | Attending: Internal Medicine | Admitting: Internal Medicine

## 2021-07-17 ENCOUNTER — Encounter (HOSPITAL_COMMUNITY)
Admission: RE | Disposition: A | Discharge status: Home / Self Care | Payer: Self-pay | Source: Home / Self Care | Attending: Internal Medicine

## 2021-07-17 ENCOUNTER — Other Ambulatory Visit: Payer: Self-pay

## 2021-07-17 ENCOUNTER — Encounter (HOSPITAL_COMMUNITY): Payer: Self-pay | Admitting: Internal Medicine

## 2021-07-17 ENCOUNTER — Ambulatory Visit (HOSPITAL_BASED_OUTPATIENT_CLINIC_OR_DEPARTMENT_OTHER): Payer: Medicare HMO | Admitting: Anesthesiology

## 2021-07-17 ENCOUNTER — Ambulatory Visit (HOSPITAL_COMMUNITY): Payer: Medicare HMO | Admitting: Anesthesiology

## 2021-07-17 DIAGNOSIS — I5040 Unspecified combined systolic (congestive) and diastolic (congestive) heart failure: Secondary | ICD-10-CM | POA: Insufficient documentation

## 2021-07-17 DIAGNOSIS — I252 Old myocardial infarction: Secondary | ICD-10-CM | POA: Diagnosis not present

## 2021-07-17 DIAGNOSIS — I739 Peripheral vascular disease, unspecified: Secondary | ICD-10-CM | POA: Insufficient documentation

## 2021-07-17 DIAGNOSIS — Z950 Presence of cardiac pacemaker: Secondary | ICD-10-CM | POA: Insufficient documentation

## 2021-07-17 DIAGNOSIS — I2510 Atherosclerotic heart disease of native coronary artery without angina pectoris: Secondary | ICD-10-CM | POA: Insufficient documentation

## 2021-07-17 DIAGNOSIS — I10 Essential (primary) hypertension: Secondary | ICD-10-CM | POA: Diagnosis not present

## 2021-07-17 DIAGNOSIS — Z951 Presence of aortocoronary bypass graft: Secondary | ICD-10-CM | POA: Insufficient documentation

## 2021-07-17 DIAGNOSIS — I255 Ischemic cardiomyopathy: Secondary | ICD-10-CM | POA: Diagnosis not present

## 2021-07-17 DIAGNOSIS — Z7901 Long term (current) use of anticoagulants: Secondary | ICD-10-CM | POA: Insufficient documentation

## 2021-07-17 DIAGNOSIS — N184 Chronic kidney disease, stage 4 (severe): Secondary | ICD-10-CM | POA: Insufficient documentation

## 2021-07-17 DIAGNOSIS — I4891 Unspecified atrial fibrillation: Secondary | ICD-10-CM | POA: Diagnosis not present

## 2021-07-17 DIAGNOSIS — I4892 Unspecified atrial flutter: Secondary | ICD-10-CM | POA: Diagnosis not present

## 2021-07-17 DIAGNOSIS — I130 Hypertensive heart and chronic kidney disease with heart failure and stage 1 through stage 4 chronic kidney disease, or unspecified chronic kidney disease: Secondary | ICD-10-CM | POA: Diagnosis not present

## 2021-07-17 HISTORY — PX: CARDIOVERSION: SHX1299

## 2021-07-17 SURGERY — CARDIOVERSION
Anesthesia: General

## 2021-07-17 MED ORDER — LIDOCAINE 2% (20 MG/ML) 5 ML SYRINGE
INTRAMUSCULAR | Status: DC | PRN
  Administered 2021-07-17: 60 mg via INTRAVENOUS

## 2021-07-17 MED ORDER — SODIUM CHLORIDE 0.9 % IV SOLN: INTRAVENOUS | Status: DC

## 2021-07-17 MED ORDER — PROPOFOL 10 MG/ML IV BOLUS
INTRAVENOUS | Status: DC | PRN
  Administered 2021-07-17: 70 mg via INTRAVENOUS

## 2021-07-17 NOTE — Procedures (Signed)
Procedure: Electrical Cardioversion ?Indications: Atrial Flutter ? ?Procedure Details: ? ?Consent: Risks of procedure as well as the alternatives and risks of each were explained to the (patient/caregiver).  Consent for procedure obtained. ? ?Time Out: Verified patient identification, verified procedure, site/side was marked, verified correct patient position, special equipment/implants available, medications/allergies/relevent history reviewed, required imaging and test results available. PERFORMED. ? ?Patient placed on cardiac monitor, pulse oximetry, supplemental oxygen as necessary.  ?Sedation given:  Propofol ?Pacer pads placed anterior and posterior chest. ? ?Cardioverted 1 time(s).  ?Cardioversion with synchronized biphasic 200J shock. ? ?Evaluation: ?Findings: Post procedure EKG shows: NSR ?Complications: None ?Patient did tolerate procedure well. ? ?Time Spent Directly with the Patient: ? ?20 minutes  ? ?Kyle Avila ?07/17/2021, 10:50 AM ? ?

## 2021-07-17 NOTE — Interval H&P Note (Signed)
History and Physical Interval Note: ? ?07/17/2021 ?8:42 AM ? ?[name]  has presented today for surgery, with the diagnosis of AFIB.  The various methods of treatment have been discussed with the patient and family. After consideration of risks, benefits and other options for treatment, the patient has consented to  Procedure(s): ?CARDIOVERSION (N/A) as a surgical intervention.  [name] history has been reviewed, patient examined, no change in status, stable for surgery.  I have reviewed the patient's chart and labs.  Questions were answered to the patient's satisfaction.   ? ? ?Kyle Avila ? ? ?

## 2021-07-17 NOTE — Transfer of Care (Signed)
Immediate Anesthesia Transfer of Care Note ? ?Patient: Kyle Avila ? ?Procedure(s) Performed: CARDIOVERSION ? ?Patient Location: PACU and Endoscopy Unit ? ?Anesthesia Type:General ? ?Level of Consciousness: drowsy ? ?Airway & Oxygen Therapy: Patient Spontanous Breathing and Patient connected to nasal cannula oxygen ? ?Post-op Assessment: Report given to RN and Post -op Vital signs reviewed and stable ? ?Post vital signs: Reviewed and stable ? ?Last Vitals:  ?Vitals Value Taken Time  ?BP 109/61   ?Temp    ?Pulse 60   ?Resp    ?SpO2 93   ? ? ?Last Pain:  ?Vitals:  ? 07/17/21 0856  ?TempSrc: Temporal  ?PainSc: 0-No pain  ?   ? ?  ? ?Complications: No notable events documented. ?

## 2021-07-17 NOTE — Anesthesia Procedure Notes (Signed)
Date/Time: 07/17/2021 10:15 AM ?Performed by: Trinna Post., CRNA ?Pre-anesthesia Checklist: Patient identified, Emergency Drugs available, Suction available, Patient being monitored and Timeout performed ?Patient Re-evaluated:Patient Re-evaluated prior to induction ?Oxygen Delivery Method: Nasal cannula ?Preoxygenation: Pre-oxygenation with 100% oxygen ?Induction Type: IV induction ?Placement Confirmation: positive ETCO2 ? ? ? ? ?

## 2021-07-17 NOTE — Anesthesia Preprocedure Evaluation (Signed)
Anesthesia Evaluation  ?Patient identified by MRN, date of birth, ID band ?Patient awake ? ? ? ?Reviewed: ?Allergy & Precautions, NPO status , Patient's Chart, lab work & pertinent test results ? ?Airway ?Mallampati: II ? ?TM Distance: >3 FB ?Neck ROM: Full ? ? ? Dental ?no notable dental hx. ? ?  ?Pulmonary ?neg pulmonary ROS,  ?  ?Pulmonary exam normal ?breath sounds clear to auscultation ? ? ? ? ? ? Cardiovascular ?hypertension, Pt. on medications and Pt. on home beta blockers ?+ CAD, + Past MI, + CABG and + Peripheral Vascular Disease  ?Normal cardiovascular exam+ dysrhythmias + pacemaker  ?Rhythm:Regular Rate:Normal ? ? ?  ?Neuro/Psych ?negative neurological ROS ? negative psych ROS  ? GI/Hepatic ?negative GI ROS, Neg liver ROS,   ?Endo/Other  ?Hypothyroidism  ? Renal/GU ?Renal InsufficiencyRenal disease  ?negative genitourinary ?  ?Musculoskeletal ?negative musculoskeletal ROS ?(+)  ? Abdominal ?  ?Peds ?negative pediatric ROS ?(+)  Hematology ?negative hematology ROS ?(+)   ?Anesthesia Other Findings ? ? Reproductive/Obstetrics ?negative OB ROS ? ?  ? ? ? ? ? ? ? ? ? ? ? ? ? ?  ?  ? ? ? ? ? ? ? ? ?Anesthesia Physical ?Anesthesia Plan ? ?ASA: 3 ? ?Anesthesia Plan: General  ? ?Post-op Pain Management: Minimal or no pain anticipated  ? ?Induction: Intravenous ? ?PONV Risk Score and Plan: 2 and Treatment may vary due to age or medical condition ? ?Airway Management Planned: Mask and Nasal Cannula ? ?Additional Equipment:  ? ?Intra-op Plan:  ? ?Post-operative Plan:  ? ?Informed Consent: I have reviewed the patients History and Physical, chart, labs and discussed the procedure including the risks, benefits and alternatives for the proposed anesthesia with the patient or authorized representative who has indicated his/her understanding and acceptance.  ? ? ? ?Dental advisory given ? ?Plan Discussed with: CRNA and Surgeon ? ?Anesthesia Plan Comments:   ? ? ? ? ? ? ?Anesthesia Quick  Evaluation ? ?

## 2021-07-18 ENCOUNTER — Encounter: Payer: Self-pay | Admitting: Cardiovascular Disease

## 2021-07-18 NOTE — Anesthesia Postprocedure Evaluation (Signed)
Anesthesia Post Note ? ?Patient: Kyle Avila ? ?Procedure(s) Performed: CARDIOVERSION ? ?  ? ?Patient location during evaluation: PACU ?Anesthesia Type: General ?Level of consciousness: awake and alert ?Pain management: pain level controlled ?Vital Signs Assessment: post-procedure vital signs reviewed and stable ?Respiratory status: spontaneous breathing, nonlabored ventilation, respiratory function stable and patient connected to nasal cannula oxygen ?Cardiovascular status: blood pressure returned to baseline and stable ?Postop Assessment: no apparent nausea or vomiting ?Anesthetic complications: no ? ? ?No notable events documented. ? ?Last Vitals:  ?Vitals:  ? 07/17/21 1038 07/17/21 1047  ?BP: (!) 111/58 125/60  ?Pulse: (!) 58 60  ?Resp: 16 11  ?Temp:    ?SpO2: 99% 96%  ?  ?Last Pain:  ?Vitals:  ? 07/17/21 1047  ?TempSrc:   ?PainSc: 0-No pain  ? ? ?  ?  ?  ?  ?  ?  ? ?[name],[name] ? ? ? ? ?

## 2021-07-19 ENCOUNTER — Encounter: Payer: Self-pay | Admitting: Cardiovascular Disease

## 2021-07-19 DIAGNOSIS — I209 Angina pectoris, unspecified: Secondary | ICD-10-CM

## 2021-07-21 ENCOUNTER — Encounter (HOSPITAL_COMMUNITY): Payer: Self-pay | Admitting: Internal Medicine

## 2021-07-22 ENCOUNTER — Telehealth: Payer: Self-pay

## 2021-07-22 DIAGNOSIS — H353211 Exudative age-related macular degeneration, right eye, with active choroidal neovascularization: Secondary | ICD-10-CM | POA: Diagnosis not present

## 2021-07-22 NOTE — Telephone Encounter (Signed)
The patient LMOVM to see if he was still in A-fib. He sent a transmission for the nurse to review. His phone number is [phone]. ?

## 2021-07-22 NOTE — Telephone Encounter (Signed)
Patient called in wanting a nurse to see if he is still in afib, patient has sent in remote transmission. I assured patient that a nurse will call him back  ?

## 2021-07-22 NOTE — Telephone Encounter (Signed)
Patient called and advised he is in regular rhythm. Appreciative of call.  ? ? ? ?

## 2021-07-24 ENCOUNTER — Ambulatory Visit: Payer: Medicare HMO | Admitting: Pulmonary Disease

## 2021-07-24 DIAGNOSIS — R809 Proteinuria, unspecified: Secondary | ICD-10-CM | POA: Diagnosis not present

## 2021-07-24 DIAGNOSIS — N2581 Secondary hyperparathyroidism of renal origin: Secondary | ICD-10-CM | POA: Diagnosis not present

## 2021-07-24 DIAGNOSIS — I2510 Atherosclerotic heart disease of native coronary artery without angina pectoris: Secondary | ICD-10-CM | POA: Diagnosis not present

## 2021-07-24 DIAGNOSIS — I129 Hypertensive chronic kidney disease with stage 1 through stage 4 chronic kidney disease, or unspecified chronic kidney disease: Secondary | ICD-10-CM | POA: Diagnosis not present

## 2021-07-24 DIAGNOSIS — E785 Hyperlipidemia, unspecified: Secondary | ICD-10-CM | POA: Diagnosis not present

## 2021-07-24 DIAGNOSIS — I5040 Unspecified combined systolic (congestive) and diastolic (congestive) heart failure: Secondary | ICD-10-CM | POA: Diagnosis not present

## 2021-07-24 DIAGNOSIS — E875 Hyperkalemia: Secondary | ICD-10-CM | POA: Diagnosis not present

## 2021-07-24 DIAGNOSIS — M329 Systemic lupus erythematosus, unspecified: Secondary | ICD-10-CM | POA: Diagnosis not present

## 2021-07-24 DIAGNOSIS — N184 Chronic kidney disease, stage 4 (severe): Secondary | ICD-10-CM | POA: Diagnosis not present

## 2021-07-25 NOTE — Telephone Encounter (Signed)
Symptoms sound concerning for unstable angina. He's had angina for a long time. I can't get a clear picture whether this feels like his typical angina. If he is having recurrent chest pain at rest or with minimal activity, he should be evaluated in the ER. If things are stable, I could see him tomorrow morning in the office and we should check a troponin level as part of his evaluation. His EKG will not be revealing because of his pacemaker.  ?

## 2021-07-26 ENCOUNTER — Other Ambulatory Visit: Payer: Self-pay

## 2021-07-26 ENCOUNTER — Other Ambulatory Visit (HOSPITAL_COMMUNITY)
Admission: RE | Admit: 2021-07-26 | Discharge: 2021-07-26 | Disposition: A | Discharge status: Home / Self Care | Payer: Medicare HMO | Source: Ambulatory Visit | Attending: Cardiovascular Disease | Admitting: Cardiovascular Disease

## 2021-07-26 ENCOUNTER — Emergency Department (HOSPITAL_COMMUNITY): Payer: Medicare HMO

## 2021-07-26 ENCOUNTER — Encounter (HOSPITAL_COMMUNITY): Payer: Self-pay | Admitting: Emergency Medicine

## 2021-07-26 ENCOUNTER — Telehealth: Payer: Self-pay

## 2021-07-26 ENCOUNTER — Emergency Department (HOSPITAL_COMMUNITY)
Admission: EM | Admit: 2021-07-26 | Discharge: 2021-07-26 | Disposition: A | Discharge status: Home / Self Care | Payer: Medicare HMO | Attending: Emergency Medicine | Admitting: Emergency Medicine

## 2021-07-26 DIAGNOSIS — Z79899 Other long term (current) drug therapy: Secondary | ICD-10-CM | POA: Insufficient documentation

## 2021-07-26 DIAGNOSIS — I2510 Atherosclerotic heart disease of native coronary artery without angina pectoris: Secondary | ICD-10-CM | POA: Diagnosis not present

## 2021-07-26 DIAGNOSIS — I130 Hypertensive heart and chronic kidney disease with heart failure and stage 1 through stage 4 chronic kidney disease, or unspecified chronic kidney disease: Secondary | ICD-10-CM | POA: Insufficient documentation

## 2021-07-26 DIAGNOSIS — Z85828 Personal history of other malignant neoplasm of skin: Secondary | ICD-10-CM | POA: Insufficient documentation

## 2021-07-26 DIAGNOSIS — N184 Chronic kidney disease, stage 4 (severe): Secondary | ICD-10-CM | POA: Diagnosis not present

## 2021-07-26 DIAGNOSIS — Z950 Presence of cardiac pacemaker: Secondary | ICD-10-CM | POA: Diagnosis not present

## 2021-07-26 DIAGNOSIS — R0789 Other chest pain: Secondary | ICD-10-CM | POA: Diagnosis not present

## 2021-07-26 DIAGNOSIS — R079 Chest pain, unspecified: Secondary | ICD-10-CM | POA: Insufficient documentation

## 2021-07-26 DIAGNOSIS — Z7901 Long term (current) use of anticoagulants: Secondary | ICD-10-CM | POA: Diagnosis not present

## 2021-07-26 DIAGNOSIS — I4891 Unspecified atrial fibrillation: Secondary | ICD-10-CM | POA: Insufficient documentation

## 2021-07-26 DIAGNOSIS — I509 Heart failure, unspecified: Secondary | ICD-10-CM | POA: Diagnosis not present

## 2021-07-26 DIAGNOSIS — R531 Weakness: Secondary | ICD-10-CM | POA: Diagnosis not present

## 2021-07-26 LAB — BASIC METABOLIC PANEL
Anion gap: 8 (ref 5–15)
BUN: 73 mg/dL — ABNORMAL HIGH (ref 8–23)
CO2: 27 mmol/L (ref 22–32)
Calcium: 9.1 mg/dL (ref 8.9–10.3)
Chloride: 103 mmol/L (ref 98–111)
Creatinine, Ser: 3.18 mg/dL — ABNORMAL HIGH (ref 0.61–1.24)
GFR, Estimated: 19 mL/min — ABNORMAL LOW (ref >60)
Glucose, Bld: 121 mg/dL — ABNORMAL HIGH (ref 70–99)
Potassium: 4.7 mmol/L (ref 3.5–5.1)
Sodium: 138 mmol/L (ref 135–145)

## 2021-07-26 LAB — TROPONIN I (HIGH SENSITIVITY)
Troponin I (High Sensitivity): 77 ng/L — ABNORMAL HIGH (ref <18)
Troponin I (High Sensitivity): 86 ng/L — ABNORMAL HIGH (ref <18)
Troponin I (High Sensitivity): 85 ng/L — ABNORMAL HIGH (ref <18)

## 2021-07-26 LAB — CBC
HCT: 41 % (ref 39.0–52.0)
Hemoglobin: 13.5 g/dL (ref 13.0–17.0)
MCH: 35.8 pg — ABNORMAL HIGH (ref 26.0–34.0)
MCHC: 32.9 g/dL (ref 30.0–36.0)
MCV: 108.8 fL — ABNORMAL HIGH (ref 80.0–100.0)
Platelets: 188 10*3/uL (ref 150–400)
RBC: 3.77 MIL/uL — ABNORMAL LOW (ref 4.22–5.81)
RDW: 13.4 % (ref 11.5–15.5)
WBC: 3.8 10*3/uL — ABNORMAL LOW (ref 4.0–10.5)
nRBC: 0 % (ref 0.0–0.2)

## 2021-07-26 MED ORDER — AMLODIPINE BESYLATE 2.5 MG PO TABS: 2.5000 mg | ORAL_TABLET | Freq: Every day | ORAL | 0 refills | Status: DC

## 2021-07-26 NOTE — Telephone Encounter (Signed)
Called and spoke with Dr Burt Knack regarding positive Troponin. Per Burt Knack, this is elevated enough that he feels pt is probably experiencing some sort of cardiac-related complication and that he should go to the emergency room to be evaluated. ? ?Erick Murin and spoke to him directly. He understands plan and states he will get a neighbor/friend to take him within the next hour. ? ?Will make Gay Filler aware as well ?

## 2021-07-26 NOTE — Discharge Instructions (Signed)
Your work-up today is reassuring.  I discussed your case with cardiologist who recommends starting 2.5 amlodipine and follow-up with clinic early next week.  If you have any worsening symptoms in the meantime please return to your emergency room. ?

## 2021-07-26 NOTE — Telephone Encounter (Signed)
Spoke again with patient this morning following MyChart message which stated his chest pain was different than previous angina experiences and had subsided, but was now present again. Advised pt to go to [facility] for stat troponin to be drawn and we would call him with further plan once resulted. Pt agrees to plan. ?

## 2021-07-26 NOTE — ED Provider Notes (Signed)
?[facility] ?Provider Note ? ? ?CSN: [id number] ?Arrival date & time: 07/26/21  1457 ? ?  ? ?History ? ?Chief Complaint  ?Patient presents with  ? Chest Pain  ? ? ?Kyle Avila is a 76 y.o. male. ? ?76 year old male with past medical history of A-fib, CAD, CHF presents today for evaluation of chest pain.  Patient was referred in by his cardiologist.  Patient had an outpatient troponin done which was elevated and was encouraged to come into the emergency room for additional work-up.  Patient states that he had a recent visit and January to [place] for ski trip during which she was hospitalized for CHF exacerbation and had IV diuresis.  He states since then he has not felt right.  He states since beginning of February he has had a chest ache that has been on and off that was worse yesterday and that prompted him to call his cardiology office today.  Patient currently denies any chest pain, shortness of breath, or any other complaints. ? ?The history is provided by the patient. No language interpreter was used.  ? ?  ? ?Home Medications ?Prior to Admission medications   ?Medication Sig Start Date End Date Taking? Authorizing Provider  ?allopurinol (ZYLOPRIM) 100 MG tablet Take 100 mg by mouth at bedtime.    [provider]  ?apixaban (ELIQUIS) 5 MG TABS tablet Take 1 tablet (5 mg total) by mouth 2 (two) times daily. 07/08/21   [name], MD  ?Bevacizumab (AVASTIN IV) Inject into the vein. Eye injections- once monthly in the right eye    [provider]  ?ciclopirox (PENLAC) 8 % solution Apply topically at bedtime. Apply over nail and surrounding skin. Apply daily over previous coat. After seven (7) days, may remove with alcohol and continue cycle. 06/17/21   Criselda Peaches, DPM  ?clopidogrel (PLAVIX) 75 MG tablet Take 75 mg by mouth at bedtime.    [provider]  ?fluticasone (FLONASE) 50 MCG/ACT nasal spray Place 2 sprays into both nostrils daily  as needed for allergies or rhinitis.    [provider]  ?furosemide (LASIX) 80 MG tablet Take 1 tablet by mouth each morning and 1/2 tablet daily in the afternoon until fluid is resolved, then decrease to 1 tablet daily thereafter ?Patient taking differently: Take 40-80 mg by mouth See admin instructions. Take 1 tablet by mouth each morning. Take 1/2 tablet in the afternoon if needed for fluid. 06/05/21   [name], MD  ?isosorbide mononitrate (IMDUR) 120 MG 24 hr tablet TAKE 1 TABLET (120 MG TOTAL) BY MOUTH DAILY. 11/15/20   [name], MD  ?ketoconazole (NIZORAL) 2 % cream Apply 1 application. topically daily as needed for irritation.    [provider]  ?losartan (COZAAR) 100 MG tablet Take 1 tablet (100 mg total) by mouth daily. 10/05/20   [name], MD  ?Mepolizumab (NUCALA) 100 MG/ML SOAJ Inject 1 mL (100 mg total) into the skin every 28 (twenty-eight) days. 07/08/21   Hunsucker, Bonna Gains, MD  ?metoprolol succinate (TOPROL-XL) 50 MG 24 hr tablet TAKE 1 TABLET TWO TIMES DAILY. TAKE WITH OR IMMEDIATELY FOLLOWING MEALS. 11/15/20   [name], MD  ?nitroGLYCERIN (NITROSTAT) 0.4 MG SL tablet PLACE 1 TABLET UNDER THE TONGUE EVERY 5 MINUTES AS NEEDED FOR CHEST PAIN 04/30/21 04/30/22  [name], MD  ?ranolazine (RANEXA) 1000 MG SR tablet Take 1 tablet (1,000 mg total) by mouth 2 (two) times daily. 07/20/19 07/17/21  [name],  MD  ?REPATHA SURECLICK 284 MG/ML SOAJ Inject 140 mg into the skin every 14 (fourteen) days. 05/26/21   [provider]  ?tamsulosin (FLOMAX) 0.4 MG CAPS Take 0.4 mg by mouth daily. 09/25/12   [provider]  ?   ? ?Allergies    ?Patient has no known allergies.   ? ?Review of Systems   ?Review of Systems  ?Constitutional:  Negative for activity change, chills and fever.  ?Respiratory:  Negative for cough and shortness of breath.   ?Cardiovascular:  Negative for chest pain.  ?Gastrointestinal:  Negative for abdominal pain, nausea and  vomiting.  ?Neurological:  Negative for syncope, weakness and light-headedness.  ?All other systems reviewed and are negative. ? ?Physical Exam ?Updated Vital Signs ?BP 136/77   Pulse 61   Temp 98 ?F (36.7 ?C) (Oral)   Resp 19   Ht '5\' 8"'$  (1.727 m)   Wt 72.6 kg   SpO2 99%   BMI 24.33 kg/m?  ?Physical Exam ?Vitals and nursing note reviewed.  ?Constitutional:   ?   General: He is not in acute distress. ?   Appearance: Normal appearance. He is not ill-appearing.  ?HENT:  ?   Head: Normocephalic and atraumatic.  ?   Nose: Nose normal.  ?Eyes:  ?   General: No scleral icterus. ?   Extraocular Movements: Extraocular movements intact.  ?   Conjunctiva/sclera: Conjunctivae normal.  ?Cardiovascular:  ?   Rate and Rhythm: Normal rate and regular rhythm.  ?   Pulses: Normal pulses.  ?   Heart sounds: Normal heart sounds.  ?Pulmonary:  ?   Effort: Pulmonary effort is normal. No respiratory distress.  ?   Breath sounds: Normal breath sounds. No wheezing or rales.  ?Abdominal:  ?   General: There is no distension.  ?   Tenderness: There is no abdominal tenderness.  ?Musculoskeletal:     ?   General: Normal range of motion.  ?   Cervical back: Normal range of motion.  ?Skin: ?   General: Skin is warm and dry.  ?Neurological:  ?   General: No focal deficit present.  ?   Mental Status: He is alert. Mental status is at baseline.  ? ? ?ED Results / Procedures / Treatments   ?Labs ?(all labs ordered are listed, but only abnormal results are displayed) ?Labs Reviewed  ?BASIC METABOLIC PANEL - Abnormal; Notable for the following components:  ?    Result Value  ? Glucose, Bld 121 (*)   ? BUN 73 (*)   ? Creatinine, Ser 3.18 (*)   ? GFR, Estimated 19 (*)   ? All other components within normal limits  ?CBC - Abnormal; Notable for the following components:  ? WBC 3.8 (*)   ? RBC 3.77 (*)   ? MCV 108.8 (*)   ? MCH 35.8 (*)   ? All other components within normal limits  ?TROPONIN I (HIGH SENSITIVITY) - Abnormal; Notable for the following  components:  ? Troponin I (High Sensitivity) 86 (*)   ? All other components within normal limits  ?TROPONIN I (HIGH SENSITIVITY) - Abnormal; Notable for the following components:  ? Troponin I (High Sensitivity) 85 (*)   ? All other components within normal limits  ? ? ?EKG ?None ? ?Radiology ?DG Chest 2 View ? ?Result Date: 07/26/2021 ?CLINICAL DATA:  Dull chest pain, some weakness. EXAM: CHEST - 2 VIEW COMPARISON:  None. FINDINGS: RIGHT - pacer. Midline sternotomy. Normal mediastinum and cardiac silhouette. Normal  pulmonary vasculature. No evidence of effusion, infiltrate, or pneumothorax. No acute bony abnormality. IMPRESSION: No active cardiopulmonary disease. Electronically Signed   By: Suzy Bouchard M.D.   On: 07/26/2021 15:37   ? ?Procedures ?Procedures  ? ? ?Medications Ordered in ED ?Medications - No data to display ? ?ED Course/ Medical Decision Making/ A&P ?  ?                        ?Medical Decision Making ?Amount and/or Complexity of Data Reviewed ?Labs: ordered. ?Radiology: ordered. ? ?Risk ?Prescription drug management. ? ? ?Medical Decision Making / ED Course ? ? ?This patient presents to the ED for concern of chest pain, this involves an extensive number of treatment options, and is a complaint that carries with it a high risk of complications and morbidity.  The differential diagnosis includes ACS, pneumonia, MSK pain, GERD, PE ? ?MDM: ?75 year old male with past medical history of A-fib, CAD, CHF presents today for evaluation of chest pain.  Patient was evaluated at cardiology office and had troponin which was elevated at 77 and was referred to the emergency room.  In the emergency room patient is without chest pain.  Subsequent troponin were 86 then 85.  Chest x-ray was unremarkable.  CBC and BMP without acute concerns.  Discussion had with on-call cardiology (Dr. Shirlee Latch) who recommends adding on 2.5 mg of amlodipine since patient is otherwise maxed out on his GDMT and have the patient  follow-up with clinic early next week. Discussed with patient who voices understanding and is in agreement with plan.  Patient is eager for discharge and does not want to stay for inpatient management.  Return precauti

## 2021-07-26 NOTE — ED Triage Notes (Signed)
Patient here with of chest pain that started in January this year and has not completely resolved since then. Seen by cardiologist this morning, troponin drawn this morning with result of 77, previous test results 66-77. Patient is alert, oriented, speaking in complete sentences and is in no apparent distress at this time. Patient has implanted pacemaker made by [employer]. ?

## 2021-07-26 NOTE — ED Provider Triage Note (Signed)
Emergency Medicine Provider Triage Evaluation Note ? ?Kyle Avila , a 76 y.o. male  was evaluated in triage.  Pt complains of chest pain since January.  Patient tells a long and winding story about how he has a chest pain since 1980s, states that "I have had angina since the 80s that is before you are born".  Patient continues that he was skiing in January, was noted to not feel well and was reportedly diagnosed with congestive heart failure as result.  Patient here today with chest pain, bilateral arm pain. ? ?Review of Systems  ?Positive: Chest pain, bilateral arm pain ?Negative: Shortness of breath, nausea, vomiting, diarrhea, fevers ? ?Physical Exam  ?BP 133/71 (BP Location: Right Arm)   Pulse 64   Temp 98.4 ?F (36.9 ?C) (Oral)   Resp 14   SpO2 100%  ?Gen:   Awake, no distress   ?Resp:  Normal effort  ?MSK:   Moves extremities without difficulty  ?Other:   ? ?Medical Decision Making  ?Medically screening exam initiated at 3:15 PM.  Appropriate orders placed.  Kyle Avila was informed that the remainder of the evaluation will be completed by another provider, this initial triage assessment does not replace that evaluation, and the importance of remaining in the ED until their evaluation is complete. ? ? ?  ?Azucena Cecil, PA-C ?07/26/21 1516 ? ?

## 2021-07-27 ENCOUNTER — Telehealth: Payer: Self-pay | Admitting: Physician Assistant

## 2021-07-27 NOTE — Telephone Encounter (Signed)
Received sign out from cardiology fellow. He had received sign-out from yesterday's day team about this patient coming to ER. Per cardiology fellow Dr. Shirlee Latch, the patient's troponins were flat and the patient declined to be admitted to the hospital so he was discharged home with restart of amlodipine and asked to f/u in clinic next week. Will cc to Dr. Burt Knack so he is aware. Will also route to scheduling team to help assist with post ER follow-up. ?

## 2021-07-29 ENCOUNTER — Encounter: Payer: Self-pay | Admitting: Cardiovascular Disease

## 2021-07-29 ENCOUNTER — Encounter: Payer: Self-pay | Admitting: Pulmonary Disease

## 2021-07-30 ENCOUNTER — Ambulatory Visit (HOSPITAL_COMMUNITY): Payer: Medicare HMO | Admitting: Physician Assistant

## 2021-07-30 ENCOUNTER — Ambulatory Visit: Payer: Medicare HMO | Admitting: Cardiovascular Disease

## 2021-07-30 ENCOUNTER — Encounter: Payer: Self-pay | Admitting: Cardiovascular Disease

## 2021-07-30 ENCOUNTER — Encounter (HOSPITAL_COMMUNITY): Payer: Self-pay

## 2021-07-30 VITALS — BP 110/70 | HR 65 | Ht 68.0 in | Wt 168.2 lb

## 2021-07-30 DIAGNOSIS — E782 Mixed hyperlipidemia: Secondary | ICD-10-CM

## 2021-07-30 DIAGNOSIS — I25119 Atherosclerotic heart disease of native coronary artery with unspecified angina pectoris: Secondary | ICD-10-CM | POA: Diagnosis not present

## 2021-07-30 DIAGNOSIS — I4819 Other persistent atrial fibrillation: Secondary | ICD-10-CM

## 2021-07-30 DIAGNOSIS — I5022 Chronic systolic (congestive) heart failure: Secondary | ICD-10-CM

## 2021-07-30 DIAGNOSIS — I10 Essential (primary) hypertension: Secondary | ICD-10-CM

## 2021-07-30 NOTE — H&P (View-Only) (Signed)
?Cardiology Office Note:   ? ?Date:  07/30/2021  ? ?ID:  Kyle Avila, DOB 1945/06/04, MRN [id number] ? ?PCP:  [name], MD ?  ?[facility] Providers ?Cardiologist:  [name], MD ?Electrophysiologist:  [name], MD    ? ?Referring MD: [name], MD  ? ?Chief Complaint  ?Patient presents with  ? Chest Pain  ? Coronary Artery Disease  ? ? ?History of Present Illness:   ? ?Kyle Avila is a 76 y.o. male with a hx of: ?Coronary artery disease  ?S/p CABG in 1988; redo in 1996 ?S/p multiple PCI procedures (DES x 2 to LCx in 2009, DES to S-PDA in 2016, DES x 2 to S-PDA in 2018) ?S/p DES to RPDA and cutting balloon POBA to S-RPDA (ISR) 6/21 ?Crescendo angina 03/23 - ER eval with positive but flat HS-Trop (77--->86--->85) ?Combined systolic and diastolic CHF ?Ischemic CM ?Echocardiogram 2/18: EF 45-50 ?Echocardiogram 4/21: EF 45-50 ?Carotid artery disease ?S/p R CEA  ?PPM 2007, Gen change 2016 ?Atrial fibrillation dx 2023 ? ?The patient presents for emergency room follow-up evaluation after being sent there last week for elevated troponin.  He had called in and notified us that he was experiencing increasing chest discomfort that was responsive to amlodipine.  He had an outpatient troponin drawn which was elevated.  He was sent to the emergency room and had 2 more troponin levels which were flat.  High-sensitivity troponin levels were 77, then 86, then 85.  He was discharged from the emergency room and presents today for post-ER follow-up.  He is here alone today.  His chest pain symptoms have improved since going back on an increased dose of amlodipine.  He is feeling a lot better now.  No dyspnea, orthopnea, PND, heart palpitations, or leg swelling.  Describes the discomfort in his chest as a pressure that as it progresses radiates into his arms. ? ? ?Past Medical History:  ?Diagnosis Date  ? Arthritis   ? "some in my back" (04/14/2017)  ? CAD (coronary artery disease)   ? a.  s/p CABG 1988  with redo in 1996 with multiple PCIs since then  ? Carotid artery occlusion   ? s/p RCEA  ? CKD (chronic kidney disease), stage IV ([place])   ? Complete heart block (HCC)   ? s/p PTVDP  ? Gout   ? HTN (hypertension)   ? Hyperlipidemia   ? Lupus ([place])   ? "that's why my kidneys are gone; lupus attacked them" (04/14/2017)  ? Mitral insufficiency   ? Myocardial infarction [facility])   ? "I've had some mild one" (04/14/2017)  ? Presence of permanent cardiac pacemaker   ? Skin cancer   ? "nose"  ? VT (ventricular tachycardia)-nonsustained 05/20/2011  ? ? ?Past Surgical History:  ?Procedure Laterality Date  ? ACHILLES TENDON REPAIR Right   ? CARDIAC CATHETERIZATION    ? CARDIAC CATHETERIZATION N/A 11/03/2014  ? Procedure: Left Heart Cath and Cors/Grafts Angiography;  Surgeon: [name], MD;  Location: [place] [facility];  Service: Cardiovascular;  Laterality: N/A;  ? CARDIAC CATHETERIZATION N/A 11/03/2014  ? Procedure: Coronary Stent Intervention;  Surgeon: [name], MD;  Location: [place] [facility];  Service: Cardiovascular;  Laterality: N/A;  ? CARDIOVERSION N/A 07/17/2021  ? Procedure: CARDIOVERSION;  Surgeon: [name], MD;  [facility] ENDOSCOPY;  Service: Cardiovascular;  Laterality: N/A;  ? CAROTID ENDARTERECTOMY  2002  ? "? side"  ? CATARACT EXTRACTION W/ INTRAOCULAR LENS IMPLANT    ? "?  side"  ? CORONARY ANGIOPLASTY WITH STENT PLACEMENT  04/14/2017  ? CORONARY ANGIOPLASTY WITH STENT PLACEMENT  11/03/2014  ? SVG PVA  ? CORONARY ARTERY BYPASS GRAFT    ? with redo cabg  ? CORONARY STENT INTERVENTION N/A 10/18/2019  ? Procedure: CORONARY STENT INTERVENTION;  Surgeon: [name], MD;  Location: [place] [facility];  Service: Cardiovascular;  Laterality: N/A;  ? CORONARY/GRAFT ANGIOGRAPHY N/A 10/18/2019  ? Procedure: CORONARY/GRAFT ANGIOGRAPHY;  Surgeon: [name], MD;  Location: [place] [facility];  Service: Cardiovascular;  Laterality: N/A;  ? EP IMPLANTABLE DEVICE N/A 09/13/2014  ? Procedure: PPM  Generator Changeout;  Surgeon: Deboraha Sprang, MD;  Location: [place] [facility];  Service: Cardiovascular;  Laterality: N/A;  ? LEFT HEART CATH AND CORS/GRAFTS ANGIOGRAPHY N/A 04/14/2017  ? Procedure: LEFT HEART CATH AND CORS/GRAFTS ANGIOGRAPHY;  Surgeon: [name], MD;  Location: [place] [facility];  Service: Cardiovascular;  Laterality: N/A;  ? MOHS SURGERY    ? "just outside my nose"  ? PACEMAKER INSERTION  2007  ? TONSILLECTOMY  1958  ? ? ?Current Medications: ?Current Meds  ?Medication Sig  ? allopurinol (ZYLOPRIM) 100 MG tablet Take 100 mg by mouth at bedtime.  ? amLODipine (NORVASC) 2.5 MG tablet Take 1 tablet (2.5 mg total) by mouth daily. (Patient taking differently: Take 2.5 mg by mouth daily. Per patient taking 7.5 mg daily)  ? apixaban (ELIQUIS) 5 MG TABS tablet Take 1 tablet (5 mg total) by mouth 2 (two) times daily.  ? Bevacizumab (AVASTIN IV) Inject into the vein. Eye injections- once monthly in the right eye  ? ciclopirox (PENLAC) 8 % solution Apply topically at bedtime. Apply over nail and surrounding skin. Apply daily over previous coat. After seven (7) days, may remove with alcohol and continue cycle.  ? clopidogrel (PLAVIX) 75 MG tablet Take 75 mg by mouth at bedtime.  ? fluticasone (FLONASE) 50 MCG/ACT nasal spray Place 2 sprays into both nostrils daily as needed for allergies or rhinitis.  ? furosemide (LASIX) 80 MG tablet Take 1 tablet by mouth each morning and 1/2 tablet daily in the afternoon until fluid is resolved, then decrease to 1 tablet daily thereafter (Patient taking differently: Take 40-80 mg by mouth See admin instructions. Take 1 tablet by mouth each morning. Take 1/2 tablet in the afternoon if needed for fluid.)  ? isosorbide mononitrate (IMDUR) 120 MG 24 hr tablet TAKE 1 TABLET (120 MG TOTAL) BY MOUTH DAILY.  ? ketoconazole (NIZORAL) 2 % cream Apply 1 application. topically daily as needed for irritation.  ? losartan (COZAAR) 100 MG tablet Take 1 tablet (100 mg total) by  mouth daily.  ? Mepolizumab (NUCALA) 100 MG/ML SOAJ Inject 1 mL (100 mg total) into the skin every 28 (twenty-eight) days.  ? metoprolol succinate (TOPROL-XL) 50 MG 24 hr tablet TAKE 1 TABLET TWO TIMES DAILY. TAKE WITH OR IMMEDIATELY FOLLOWING MEALS.  ? nitroGLYCERIN (NITROSTAT) 0.4 MG SL tablet PLACE 1 TABLET UNDER THE TONGUE EVERY 5 MINUTES AS NEEDED FOR CHEST PAIN  ? REPATHA SURECLICK 702 MG/ML SOAJ Inject 140 mg into the skin every 14 (fourteen) days.  ? tamsulosin (FLOMAX) 0.4 MG CAPS Take 0.4 mg by mouth daily.  ?  ? ?Allergies:   Patient has no known allergies.  ? ?Social History  ? ?Socioeconomic History  ? Marital status: Married  ?  Spouse name: Not on file  ? Number of children: Not on file  ? Years of education: Not on file  ?  Highest education level: Not on file  ?Occupational History  ? Not on file  ?Tobacco Use  ? Smoking status: Never  ?  Passive exposure: Yes  ? Smokeless tobacco: Never  ?Vaping Use  ? Vaping Use: Never used  ?Substance and Sexual Activity  ? Alcohol use: Yes  ?  Alcohol/week: 3.0 standard drinks  ?  Types: 1 Glasses of wine, 1 Cans of beer, 1 Standard drinks or equivalent per week  ?  Comment: \  ? Drug use: No  ? Sexual activity: Not Currently  ?Other Topics Concern  ? Not on file  ?Social History Narrative  ? Not on file  ? ?Social Determinants of Health  ? ?Financial Resource Strain: Not on file  ?Food Insecurity: Not on file  ?Transportation Needs: Not on file  ?Physical Activity: Not on file  ?Stress: Not on file  ?Social Connections: Not on file  ?  ? ?Family History: ?[name] family history includes Cancer in his mother; Heart attack in his father; Heart disease in his father. ? ?ROS:   ?Please see the history of present illness.    ?All other systems reviewed and are negative. ? ?EKGs/Labs/Other Studies Reviewed:   ? ?The following studies were reviewed today: ?ER records are reviewed ? ?EKG:  EKG is not ordered today.  EKG from the emergency room shows a ventricular  paced rhythm with underlying atrial fibrillation, ventricular rate is 60 bpm. ? ?Recent Labs: ?07/26/2021: BUN 73; Creatinine, Ser 3.18; Hemoglobin 13.5; Platelets 188; Potassium 4.7; Sodium 138  ?Recent Lipid

## 2021-07-30 NOTE — Telephone Encounter (Addendum)
Pt has OV scheduled 4/4 with Dr. Burt Knack.  ?Edit: disregard, Triage! Routed in error. ?

## 2021-07-30 NOTE — Patient Instructions (Signed)
Medication Instructions:  ?Your physician recommends that you continue on your current medications as directed. Please refer to the Current Medication list given to you today. ? ?*If you need a refill on your cardiac medications before your next appointment, please call your pharmacy* ? ? ?Lab Work: ?NONE ?If you have labs (blood work) drawn today and your tests are completely normal, you will receive your results only by: ?MyChart Message (if you have MyChart) OR ?A paper copy in the mail ?If you have any lab test that is abnormal or we need to change your treatment, we will call you to review the results. ? ? ?Testing/Procedures: ?NONE ? ? ?Follow-Up: ?At [facility], you and your health needs are our priority.  As part of our continuing mission to provide you with exceptional heart care, we have created designated Provider Care Teams.  These Care Teams include your primary Cardiologist (physician) and Advanced Practice Providers (APPs -  Physician Assistants and Nurse Practitioners) who all work together to provide you with the care you need, when you need it. ? ?We recommend signing up for the patient portal called "MyChart".  Sign up information is provided on this After Visit Summary.  MyChart is used to connect with patients for Virtual Visits (Telemedicine).  Patients are able to view lab/test results, encounter notes, upcoming appointments, etc.  Non-urgent messages can be sent to your provider as well.   ?To learn more about what you can do with MyChart, go to [email or web address].   ? ?Your next appointment:   ?3 month(s) ? ?The format for your next appointment:   ?In Person ? ?Provider:   ?[name], MD   ?  ?

## 2021-07-30 NOTE — Progress Notes (Signed)
?Cardiology Office Note:   ? ?Date:  07/30/2021  ? ?ID:  Kyle Avila, DOB Sep 09, 1945, MRN [id number] ? ?PCP:  [name], MD ?  ?[facility] Providers ?Cardiologist:  [name], MD ?Electrophysiologist:  [name], MD    ? ?Referring MD: [name], MD  ? ?Chief Complaint  ?Patient presents with  ? Chest Pain  ? Coronary Artery Disease  ? ? ?History of Present Illness:   ? ?Kyle Avila is a 76 y.o. male with a hx of: ?Coronary artery disease  ?S/p CABG in 1988; redo in 1996 ?S/p multiple PCI procedures (DES x 2 to LCx in 2009, DES to S-PDA in 2016, DES x 2 to S-PDA in 2018) ?S/p DES to RPDA and cutting balloon POBA to S-RPDA (ISR) 6/21 ?Crescendo angina 03/23 - ER eval with positive but flat HS-Trop (77--->86--->85) ?Combined systolic and diastolic CHF ?Ischemic CM ?Echocardiogram 2/18: EF 45-50 ?Echocardiogram 4/21: EF 45-50 ?Carotid artery disease ?S/p R CEA  ?PPM 2007, Gen change 2016 ?Atrial fibrillation dx 2023 ? ?The patient presents for emergency room follow-up evaluation after being sent there last week for elevated troponin.  He had called in and notified us that he was experiencing increasing chest discomfort that was responsive to amlodipine.  He had an outpatient troponin drawn which was elevated.  He was sent to the emergency room and had 2 more troponin levels which were flat.  High-sensitivity troponin levels were 77, then 86, then 85.  He was discharged from the emergency room and presents today for post-ER follow-up.  He is here alone today.  His chest pain symptoms have improved since going back on an increased dose of amlodipine.  He is feeling a lot better now.  No dyspnea, orthopnea, PND, heart palpitations, or leg swelling.  Describes the discomfort in his chest as a pressure that as it progresses radiates into his arms. ? ? ?Past Medical History:  ?Diagnosis Date  ? Arthritis   ? "some in my back" (04/14/2017)  ? CAD (coronary artery disease)   ? a.  s/p CABG 1988  with redo in 1996 with multiple PCIs since then  ? Carotid artery occlusion   ? s/p RCEA  ? CKD (chronic kidney disease), stage IV ([place])   ? Complete heart block (HCC)   ? s/p PTVDP  ? Gout   ? HTN (hypertension)   ? Hyperlipidemia   ? Lupus ([place])   ? "that's why my kidneys are gone; lupus attacked them" (04/14/2017)  ? Mitral insufficiency   ? Myocardial infarction [facility])   ? "I've had some mild one" (04/14/2017)  ? Presence of permanent cardiac pacemaker   ? Skin cancer   ? "nose"  ? VT (ventricular tachycardia)-nonsustained 05/20/2011  ? ? ?Past Surgical History:  ?Procedure Laterality Date  ? ACHILLES TENDON REPAIR Right   ? CARDIAC CATHETERIZATION    ? CARDIAC CATHETERIZATION N/A 11/03/2014  ? Procedure: Left Heart Cath and Cors/Grafts Angiography;  Surgeon: [name], MD;  Location: [place] [facility];  Service: Cardiovascular;  Laterality: N/A;  ? CARDIAC CATHETERIZATION N/A 11/03/2014  ? Procedure: Coronary Stent Intervention;  Surgeon: [name], MD;  Location: [place] [facility];  Service: Cardiovascular;  Laterality: N/A;  ? CARDIOVERSION N/A 07/17/2021  ? Procedure: CARDIOVERSION;  Surgeon: [name], MD;  Location: [facility] ENDOSCOPY;  Service: Cardiovascular;  Laterality: N/A;  ? CAROTID ENDARTERECTOMY  2002  ? "? side"  ? CATARACT EXTRACTION W/ INTRAOCULAR LENS IMPLANT    ? "?  side"  ? CORONARY ANGIOPLASTY WITH STENT PLACEMENT  04/14/2017  ? CORONARY ANGIOPLASTY WITH STENT PLACEMENT  11/03/2014  ? SVG PVA  ? CORONARY ARTERY BYPASS GRAFT    ? with redo cabg  ? CORONARY STENT INTERVENTION N/A 10/18/2019  ? Procedure: CORONARY STENT INTERVENTION;  Surgeon: [name], MD;  Location: [place] [facility];  Service: Cardiovascular;  Laterality: N/A;  ? CORONARY/GRAFT ANGIOGRAPHY N/A 10/18/2019  ? Procedure: CORONARY/GRAFT ANGIOGRAPHY;  Surgeon: [name], MD;  Location: [place] [facility] LAB;  Service: Cardiovascular;  Laterality: N/A;  ? EP IMPLANTABLE DEVICE N/A 09/13/2014  ? Procedure: PPM  Generator Changeout;  Surgeon: Deboraha Sprang, MD;  Location: [place] [facility];  Service: Cardiovascular;  Laterality: N/A;  ? LEFT HEART CATH AND CORS/GRAFTS ANGIOGRAPHY N/A 04/14/2017  ? Procedure: LEFT HEART CATH AND CORS/GRAFTS ANGIOGRAPHY;  Surgeon: [name], MD;  Location: [place] [facility];  Service: Cardiovascular;  Laterality: N/A;  ? MOHS SURGERY    ? "just outside my nose"  ? PACEMAKER INSERTION  2007  ? TONSILLECTOMY  1958  ? ? ?Current Medications: ?Current Meds  ?Medication Sig  ? allopurinol (ZYLOPRIM) 100 MG tablet Take 100 mg by mouth at bedtime.  ? amLODipine (NORVASC) 2.5 MG tablet Take 1 tablet (2.5 mg total) by mouth daily. (Patient taking differently: Take 2.5 mg by mouth daily. Per patient taking 7.5 mg daily)  ? apixaban (ELIQUIS) 5 MG TABS tablet Take 1 tablet (5 mg total) by mouth 2 (two) times daily.  ? Bevacizumab (AVASTIN IV) Inject into the vein. Eye injections- once monthly in the right eye  ? ciclopirox (PENLAC) 8 % solution Apply topically at bedtime. Apply over nail and surrounding skin. Apply daily over previous coat. After seven (7) days, may remove with alcohol and continue cycle.  ? clopidogrel (PLAVIX) 75 MG tablet Take 75 mg by mouth at bedtime.  ? fluticasone (FLONASE) 50 MCG/ACT nasal spray Place 2 sprays into both nostrils daily as needed for allergies or rhinitis.  ? furosemide (LASIX) 80 MG tablet Take 1 tablet by mouth each morning and 1/2 tablet daily in the afternoon until fluid is resolved, then decrease to 1 tablet daily thereafter (Patient taking differently: Take 40-80 mg by mouth See admin instructions. Take 1 tablet by mouth each morning. Take 1/2 tablet in the afternoon if needed for fluid.)  ? isosorbide mononitrate (IMDUR) 120 MG 24 hr tablet TAKE 1 TABLET (120 MG TOTAL) BY MOUTH DAILY.  ? ketoconazole (NIZORAL) 2 % cream Apply 1 application. topically daily as needed for irritation.  ? losartan (COZAAR) 100 MG tablet Take 1 tablet (100 mg total) by  mouth daily.  ? Mepolizumab (NUCALA) 100 MG/ML SOAJ Inject 1 mL (100 mg total) into the skin every 28 (twenty-eight) days.  ? metoprolol succinate (TOPROL-XL) 50 MG 24 hr tablet TAKE 1 TABLET TWO TIMES DAILY. TAKE WITH OR IMMEDIATELY FOLLOWING MEALS.  ? nitroGLYCERIN (NITROSTAT) 0.4 MG SL tablet PLACE 1 TABLET UNDER THE TONGUE EVERY 5 MINUTES AS NEEDED FOR CHEST PAIN  ? REPATHA SURECLICK 638 MG/ML SOAJ Inject 140 mg into the skin every 14 (fourteen) days.  ? tamsulosin (FLOMAX) 0.4 MG CAPS Take 0.4 mg by mouth daily.  ?  ? ?Allergies:   Patient has no known allergies.  ? ?Social History  ? ?Socioeconomic History  ? Marital status: Married  ?  Spouse name: Not on file  ? Number of children: Not on file  ? Years of education: Not on file  ?  Highest education level: Not on file  ?Occupational History  ? Not on file  ?Tobacco Use  ? Smoking status: Never  ?  Passive exposure: Yes  ? Smokeless tobacco: Never  ?Vaping Use  ? Vaping Use: Never used  ?Substance and Sexual Activity  ? Alcohol use: Yes  ?  Alcohol/week: 3.0 standard drinks  ?  Types: 1 Glasses of wine, 1 Cans of beer, 1 Standard drinks or equivalent per week  ?  Comment: \  ? Drug use: No  ? Sexual activity: Not Currently  ?Other Topics Concern  ? Not on file  ?Social History Narrative  ? Not on file  ? ?Social Determinants of Health  ? ?Financial Resource Strain: Not on file  ?Food Insecurity: Not on file  ?Transportation Needs: Not on file  ?Physical Activity: Not on file  ?Stress: Not on file  ?Social Connections: Not on file  ?  ? ?Family History: ?[name] family history includes Cancer in his mother; Heart attack in his father; Heart disease in his father. ? ?ROS:   ?Please see the history of present illness.    ?All other systems reviewed and are negative. ? ?EKGs/Labs/Other Studies Reviewed:   ? ?The following studies were reviewed today: ?ER records are reviewed ? ?EKG:  EKG is not ordered today.  EKG from the emergency room shows a ventricular  paced rhythm with underlying atrial fibrillation, ventricular rate is 60 bpm. ? ?Recent Labs: ?07/26/2021: BUN 73; Creatinine, Ser 3.18; Hemoglobin 13.5; Platelets 188; Potassium 4.7; Sodium 138  ?Recent Lipid

## 2021-07-31 ENCOUNTER — Encounter: Payer: Self-pay | Admitting: Podiatry

## 2021-07-31 ENCOUNTER — Encounter: Payer: Self-pay | Admitting: Cardiovascular Disease

## 2021-07-31 ENCOUNTER — Encounter: Payer: Self-pay | Admitting: Pulmonary Disease

## 2021-08-01 ENCOUNTER — Ambulatory Visit: Payer: Medicare HMO | Admitting: Cardiovascular Disease

## 2021-08-02 ENCOUNTER — Encounter: Payer: Self-pay | Admitting: Internal Medicine

## 2021-08-05 ENCOUNTER — Ambulatory Visit (HOSPITAL_COMMUNITY): Payer: Medicare HMO

## 2021-08-08 ENCOUNTER — Encounter: Payer: Self-pay | Admitting: Cardiovascular Disease

## 2021-08-09 DIAGNOSIS — N184 Chronic kidney disease, stage 4 (severe): Secondary | ICD-10-CM | POA: Diagnosis not present

## 2021-08-12 ENCOUNTER — Telehealth: Payer: Self-pay | Admitting: Cardiovascular Disease

## 2021-08-12 ENCOUNTER — Encounter: Payer: Self-pay | Admitting: Cardiovascular Disease

## 2021-08-12 DIAGNOSIS — Z01810 Encounter for preprocedural cardiovascular examination: Secondary | ICD-10-CM

## 2021-08-12 NOTE — Telephone Encounter (Signed)
Per verbal order of Dr Burt Knack, pt needs to hold off on plan for Amiodarone and A-fib clinic and proceed with R & L Heart cath. Pt can choose if he wants first available with Dr Burt Knack (5/2) or if he wants sooner with another one of our structural doc's. ?

## 2021-08-12 NOTE — Telephone Encounter (Signed)
See other note from this morning. Pt has been contacted via phone and MyChart ?

## 2021-08-12 NOTE — Telephone Encounter (Signed)
Pt would like a callback from the nurse regarding an AFIB medication that he thinks he's suppose to be on. Please advise ?

## 2021-08-12 NOTE — Telephone Encounter (Signed)
Spoke with patient who states he would like to get this addressed as soon as possible and if Kyle Avila is confident in someone else doing it, then he is fine with it as well.  ? ?Cath scheduled for 4/20 with Dr Angelena Form @ 10:30am ?

## 2021-08-13 ENCOUNTER — Other Ambulatory Visit: Payer: Medicare HMO

## 2021-08-13 DIAGNOSIS — Z01810 Encounter for preprocedural cardiovascular examination: Secondary | ICD-10-CM | POA: Diagnosis not present

## 2021-08-13 LAB — CBC
Hematocrit: 38.7 % (ref 37.5–51.0)
Hemoglobin: 13.1 g/dL (ref 13.0–17.7)
MCH: 35.6 pg — ABNORMAL HIGH (ref 26.6–33.0)
MCHC: 33.9 g/dL (ref 31.5–35.7)
MCV: 105 fL — ABNORMAL HIGH (ref 79–97)
Platelets: 154 10*3/uL (ref 150–450)
RBC: 3.68 x10E6/uL — ABNORMAL LOW (ref 4.14–5.80)
RDW: 13 % (ref 11.6–15.4)
WBC: 4.2 10*3/uL (ref 3.4–10.8)

## 2021-08-13 LAB — BASIC METABOLIC PANEL
BUN/Creatinine Ratio: 24 (ref 10–24)
BUN: 59 mg/dL — ABNORMAL HIGH (ref 8–27)
CO2: 28 mmol/L (ref 20–29)
Calcium: 9.7 mg/dL (ref 8.6–10.2)
Chloride: 105 mmol/L (ref 96–106)
Creatinine, Ser: 2.44 mg/dL — ABNORMAL HIGH (ref 0.76–1.27)
Glucose: 111 mg/dL — ABNORMAL HIGH (ref 70–99)
Potassium: 4.9 mmol/L (ref 3.5–5.2)
Sodium: 143 mmol/L (ref 134–144)
eGFR: 27 mL/min/{1.73_m2} — ABNORMAL LOW (ref >59)

## 2021-08-14 ENCOUNTER — Telehealth: Payer: Self-pay | Admitting: *Deleted

## 2021-08-14 NOTE — Telephone Encounter (Signed)
Cardiac Catheterization scheduled at [facility] for: Thursday August 15, 2021 10:30 AM ?Arrival time and place: [facility] Main Entrance A at: 6 AM-pre-procedure hydration-pt could not arrive at 5:30 AM ? ? ?No solid food after midnight prior to cath, clear liquids until 5 AM day of procedure. ? ?Medication instructions: ?-Hold: ? Eliquis-none 08/13/21 until post procedure ? Losartan/Lasix-day before and day of procedure-per protocol GFR 27 ?-Except hold medications usual morning medications can be taken with sips of water including aspirin 81 mg and Plavix 75 mg ? ?Confirmed patient has responsible adult to drive home post procedure and be with patient first 24 hours after arriving home. ? ?Patient reports no new symptoms concerning for COVID-19/no exposure to COVID-19 in the past 10 days. ? ?Reviewed procedure instructions with patient.  ?

## 2021-08-15 ENCOUNTER — Ambulatory Visit (HOSPITAL_COMMUNITY)
Admission: RE | Disposition: A | Discharge status: Home / Self Care | Payer: Medicare HMO | Source: Home / Self Care | Attending: Cardiovascular Disease

## 2021-08-15 ENCOUNTER — Other Ambulatory Visit: Payer: Self-pay

## 2021-08-15 ENCOUNTER — Ambulatory Visit (HOSPITAL_COMMUNITY)
Admission: RE | Admit: 2021-08-15 | Discharge: 2021-08-16 | Disposition: A | Discharge status: Home / Self Care | Payer: Medicare HMO | Attending: Cardiovascular Disease | Admitting: Cardiovascular Disease

## 2021-08-15 ENCOUNTER — Encounter (HOSPITAL_COMMUNITY): Payer: Self-pay | Admitting: Cardiovascular Disease

## 2021-08-15 DIAGNOSIS — T82855A Stenosis of coronary artery stent, initial encounter: Secondary | ICD-10-CM | POA: Insufficient documentation

## 2021-08-15 DIAGNOSIS — I252 Old myocardial infarction: Secondary | ICD-10-CM | POA: Insufficient documentation

## 2021-08-15 DIAGNOSIS — E782 Mixed hyperlipidemia: Secondary | ICD-10-CM | POA: Insufficient documentation

## 2021-08-15 DIAGNOSIS — M3214 Glomerular disease in systemic lupus erythematosus: Secondary | ICD-10-CM | POA: Diagnosis not present

## 2021-08-15 DIAGNOSIS — I5042 Chronic combined systolic (congestive) and diastolic (congestive) heart failure: Secondary | ICD-10-CM | POA: Insufficient documentation

## 2021-08-15 DIAGNOSIS — I25700 Atherosclerosis of coronary artery bypass graft(s), unspecified, with unstable angina pectoris: Secondary | ICD-10-CM | POA: Diagnosis not present

## 2021-08-15 DIAGNOSIS — Y832 Surgical operation with anastomosis, bypass or graft as the cause of abnormal reaction of the patient, or of later complication, without mention of misadventure at the time of the procedure: Secondary | ICD-10-CM | POA: Diagnosis not present

## 2021-08-15 DIAGNOSIS — Z955 Presence of coronary angioplasty implant and graft: Secondary | ICD-10-CM | POA: Insufficient documentation

## 2021-08-15 DIAGNOSIS — I200 Unstable angina: Secondary | ICD-10-CM

## 2021-08-15 DIAGNOSIS — I5020 Unspecified systolic (congestive) heart failure: Secondary | ICD-10-CM

## 2021-08-15 DIAGNOSIS — I2582 Chronic total occlusion of coronary artery: Secondary | ICD-10-CM | POA: Diagnosis not present

## 2021-08-15 DIAGNOSIS — N184 Chronic kidney disease, stage 4 (severe): Secondary | ICD-10-CM | POA: Insufficient documentation

## 2021-08-15 DIAGNOSIS — I442 Atrioventricular block, complete: Secondary | ICD-10-CM | POA: Diagnosis not present

## 2021-08-15 DIAGNOSIS — I130 Hypertensive heart and chronic kidney disease with heart failure and stage 1 through stage 4 chronic kidney disease, or unspecified chronic kidney disease: Secondary | ICD-10-CM | POA: Diagnosis not present

## 2021-08-15 DIAGNOSIS — I4819 Other persistent atrial fibrillation: Secondary | ICD-10-CM | POA: Insufficient documentation

## 2021-08-15 DIAGNOSIS — I10 Essential (primary) hypertension: Secondary | ICD-10-CM | POA: Diagnosis present

## 2021-08-15 DIAGNOSIS — Z950 Presence of cardiac pacemaker: Secondary | ICD-10-CM | POA: Insufficient documentation

## 2021-08-15 DIAGNOSIS — Z9861 Coronary angioplasty status: Secondary | ICD-10-CM

## 2021-08-15 DIAGNOSIS — I2510 Atherosclerotic heart disease of native coronary artery without angina pectoris: Secondary | ICD-10-CM | POA: Diagnosis not present

## 2021-08-15 DIAGNOSIS — I25710 Atherosclerosis of autologous vein coronary artery bypass graft(s) with unstable angina pectoris: Secondary | ICD-10-CM | POA: Diagnosis not present

## 2021-08-15 DIAGNOSIS — E1122 Type 2 diabetes mellitus with diabetic chronic kidney disease: Secondary | ICD-10-CM | POA: Diagnosis not present

## 2021-08-15 DIAGNOSIS — I6521 Occlusion and stenosis of right carotid artery: Secondary | ICD-10-CM | POA: Diagnosis not present

## 2021-08-15 DIAGNOSIS — I255 Ischemic cardiomyopathy: Secondary | ICD-10-CM | POA: Diagnosis not present

## 2021-08-15 DIAGNOSIS — E785 Hyperlipidemia, unspecified: Secondary | ICD-10-CM | POA: Diagnosis present

## 2021-08-15 HISTORY — PX: CORONARY BALLOON ANGIOPLASTY: CATH118233

## 2021-08-15 HISTORY — PX: LEFT HEART CATH AND CORS/GRAFTS ANGIOGRAPHY: CATH118250

## 2021-08-15 LAB — POCT ACTIVATED CLOTTING TIME
Activated Clotting Time: 299 seconds
Activated Clotting Time: 311 seconds
Activated Clotting Time: 197 seconds
Activated Clotting Time: 167 seconds

## 2021-08-15 SURGERY — LEFT HEART CATH AND CORS/GRAFTS ANGIOGRAPHY
Anesthesia: LOCAL

## 2021-08-15 MED ORDER — FENTANYL CITRATE (PF) 100 MCG/2ML IJ SOLN
INTRAMUSCULAR | Status: AC
  Filled 2021-08-15: qty 2

## 2021-08-15 MED ORDER — ONDANSETRON HCL 4 MG/2ML IJ SOLN: 4.0000 mg | Freq: Four times a day (QID) | INTRAMUSCULAR | Status: DC | PRN

## 2021-08-15 MED ORDER — SODIUM CHLORIDE 0.9 % IV SOLN: INTRAVENOUS | Status: AC

## 2021-08-15 MED ORDER — CLOPIDOGREL BISULFATE 75 MG PO TABS: 75.0000 mg | ORAL_TABLET | ORAL | Status: DC

## 2021-08-15 MED ORDER — LABETALOL HCL 5 MG/ML IV SOLN: 10.0000 mg | INTRAVENOUS | Status: AC | PRN

## 2021-08-15 MED ORDER — MIDAZOLAM HCL 2 MG/2ML IJ SOLN
INTRAMUSCULAR | Status: DC | PRN
Start: 2021-08-15 — End: 2021-08-15
  Administered 2021-08-15 (×3): 1 mg via INTRAVENOUS

## 2021-08-15 MED ORDER — BIVALIRUDIN BOLUS VIA INFUSION - CUPID
INTRAVENOUS | Status: DC | PRN
  Administered 2021-08-15: 55.125 mg via INTRAVENOUS

## 2021-08-15 MED ORDER — SODIUM CHLORIDE 0.9% FLUSH: 3.0000 mL | INTRAVENOUS | Status: DC | PRN

## 2021-08-15 MED ORDER — FUROSEMIDE 40 MG PO TABS
80.0000 mg | ORAL_TABLET | Freq: Every morning | ORAL | Status: DC
  Administered 2021-08-16: 80 mg via ORAL
  Filled 2021-08-15: qty 2

## 2021-08-15 MED ORDER — ASPIRIN 81 MG PO CHEW
81.0000 mg | CHEWABLE_TABLET | Freq: Every day | ORAL | Status: DC
  Filled 2021-08-15: qty 1

## 2021-08-15 MED ORDER — HYDRALAZINE HCL 20 MG/ML IJ SOLN: 10.0000 mg | INTRAMUSCULAR | Status: AC | PRN

## 2021-08-15 MED ORDER — RANOLAZINE ER 500 MG PO TB12
1000.0000 mg | ORAL_TABLET | Freq: Two times a day (BID) | ORAL | Status: DC
  Administered 2021-08-15: 1000 mg via ORAL
  Filled 2021-08-15 (×2): qty 2

## 2021-08-15 MED ORDER — SODIUM CHLORIDE 0.9 % IV SOLN: 250.0000 mL | INTRAVENOUS | Status: DC | PRN

## 2021-08-15 MED ORDER — HEPARIN (PORCINE) IN NACL 1000-0.9 UT/500ML-% IV SOLN
INTRAVENOUS | Status: AC
  Filled 2021-08-15: qty 1000

## 2021-08-15 MED ORDER — NITROGLYCERIN 1 MG/10 ML FOR IR/CATH LAB
INTRA_ARTERIAL | Status: AC
  Filled 2021-08-15: qty 10

## 2021-08-15 MED ORDER — NITROGLYCERIN 0.4 MG SL SUBL: 0.4000 mg | SUBLINGUAL_TABLET | SUBLINGUAL | Status: DC | PRN

## 2021-08-15 MED ORDER — LIDOCAINE HCL (PF) 1 % IJ SOLN
INTRAMUSCULAR | Status: AC
  Filled 2021-08-15: qty 30

## 2021-08-15 MED ORDER — AMLODIPINE BESYLATE 5 MG PO TABS
7.5000 mg | ORAL_TABLET | Freq: Every day | ORAL | Status: DC
  Filled 2021-08-15: qty 2

## 2021-08-15 MED ORDER — CLOPIDOGREL BISULFATE 75 MG PO TABS
75.0000 mg | ORAL_TABLET | Freq: Every day | ORAL | Status: DC
Start: 2021-08-16 — End: 2021-08-16

## 2021-08-15 MED ORDER — ASPIRIN 81 MG PO CHEW: 81.0000 mg | CHEWABLE_TABLET | ORAL | Status: DC

## 2021-08-15 MED ORDER — VERAPAMIL HCL 2.5 MG/ML IV SOLN
INTRAVENOUS | Status: DC | PRN
  Administered 2021-08-15: 10 mL via INTRA_ARTERIAL

## 2021-08-15 MED ORDER — IOHEXOL 350 MG/ML SOLN
INTRAVENOUS | Status: DC | PRN
  Administered 2021-08-15: 125 mL

## 2021-08-15 MED ORDER — ISOSORBIDE MONONITRATE ER 60 MG PO TB24
120.0000 mg | ORAL_TABLET | Freq: Every day | ORAL | Status: DC
  Filled 2021-08-15: qty 2

## 2021-08-15 MED ORDER — ACETAMINOPHEN 325 MG PO TABS: 650.0000 mg | ORAL_TABLET | ORAL | Status: DC | PRN

## 2021-08-15 MED ORDER — MIDAZOLAM HCL 2 MG/2ML IJ SOLN
INTRAMUSCULAR | Status: AC
Start: 2021-08-15 — End: ?
  Filled 2021-08-15: qty 2

## 2021-08-15 MED ORDER — FUROSEMIDE 40 MG PO TABS
40.0000 mg | ORAL_TABLET | Freq: Every evening | ORAL | Status: DC
  Administered 2021-08-15: 40 mg via ORAL
  Filled 2021-08-15: qty 1

## 2021-08-15 MED ORDER — TAMSULOSIN HCL 0.4 MG PO CAPS
0.4000 mg | ORAL_CAPSULE | Freq: Every day | ORAL | Status: DC
  Filled 2021-08-15: qty 1

## 2021-08-15 MED ORDER — MIDAZOLAM HCL 2 MG/2ML IJ SOLN
INTRAMUSCULAR | Status: AC
  Filled 2021-08-15: qty 2

## 2021-08-15 MED ORDER — LIDOCAINE HCL (PF) 1 % IJ SOLN
INTRAMUSCULAR | Status: DC | PRN
  Administered 2021-08-15: 2 mL

## 2021-08-15 MED ORDER — SODIUM CHLORIDE 0.9% FLUSH
3.0000 mL | Freq: Two times a day (BID) | INTRAVENOUS | Status: DC
  Administered 2021-08-15: 3 mL via INTRAVENOUS

## 2021-08-15 MED ORDER — SODIUM CHLORIDE 0.9% FLUSH: 3.0000 mL | Freq: Two times a day (BID) | INTRAVENOUS | Status: DC

## 2021-08-15 MED ORDER — METOPROLOL SUCCINATE ER 25 MG PO TB24
50.0000 mg | ORAL_TABLET | Freq: Two times a day (BID) | ORAL | Status: DC
Start: 2021-08-15 — End: 2021-08-16
  Administered 2021-08-15: 50 mg via ORAL
  Filled 2021-08-15 (×2): qty 2

## 2021-08-15 MED ORDER — FENTANYL CITRATE (PF) 100 MCG/2ML IJ SOLN
INTRAMUSCULAR | Status: DC | PRN
  Administered 2021-08-15 (×2): 25 ug via INTRAVENOUS

## 2021-08-15 MED ORDER — LOSARTAN POTASSIUM 50 MG PO TABS
100.0000 mg | ORAL_TABLET | Freq: Every day | ORAL | Status: DC
Start: 2021-08-16 — End: 2021-08-16

## 2021-08-15 MED ORDER — ALLOPURINOL 100 MG PO TABS
100.0000 mg | ORAL_TABLET | Freq: Every day | ORAL | Status: DC
Start: 2021-08-15 — End: 2021-08-16
  Administered 2021-08-15: 100 mg via ORAL
  Filled 2021-08-15: qty 1

## 2021-08-15 MED ORDER — SODIUM CHLORIDE 0.9 % WEIGHT BASED INFUSION: 3.0000 mL/kg/h | INTRAVENOUS | Status: DC

## 2021-08-15 MED ORDER — SODIUM CHLORIDE 0.9 % WEIGHT BASED INFUSION: 1.0000 mL/kg/h | INTRAVENOUS | Status: DC

## 2021-08-15 MED ORDER — SODIUM CHLORIDE 0.9 % IV SOLN
INTRAVENOUS | Status: DC | PRN
  Administered 2021-08-15: 1 mg/kg/h via INTRAVENOUS

## 2021-08-15 SURGICAL SUPPLY — 24 items
BALL SAPPHIRE NC24 4.0X12 (BALLOONS) ×2
BALLN SAPPHIRE 2.0X12 (BALLOONS) ×2
BALLN WOLVERINE 4.00X10 (BALLOONS) ×2
BALLOON SAPPHIRE 2.0X12 (BALLOONS) IMPLANT
BALLOON SAPPHIRE NC24 4.0X12 (BALLOONS) IMPLANT
BALLOON WOLVERINE 4.00X10 (BALLOONS) IMPLANT
CATH 5FR JR4 DIAGNOSTIC (CATHETERS) ×1 IMPLANT
CATH EXPO 5F MPA-1 (CATHETERS) ×1 IMPLANT
CATH INFINITI 5FR JL4 (CATHETERS) ×1 IMPLANT
CATHETER LAUNCHER 6FR MP1 (CATHETERS) ×1 IMPLANT
CATHETER LAUNCHER 6FR MP1 SH (CATHETERS) ×1 IMPLANT
ELECT DEFIB PAD ADLT CADENCE (PAD) ×1 IMPLANT
GUIDEWIRE INQWIRE 1.5J.035X260 (WIRE) IMPLANT
INQWIRE 1.5J .035X260CM (WIRE) ×2
KIT ENCORE 26 ADVANTAGE (KITS) ×1 IMPLANT
KIT HEART LEFT (KITS) ×2 IMPLANT
KIT MICROPUNCTURE NIT STIFF (SHEATH) ×1 IMPLANT
PACK CARDIAC CATHETERIZATION (CUSTOM PROCEDURE TRAY) ×2 IMPLANT
SHEATH PINNACLE 5F 10CM (SHEATH) ×1 IMPLANT
SHEATH PINNACLE 6F 10CM (SHEATH) ×1 IMPLANT
SYR MEDRAD MARK 7 150ML (SYRINGE) ×2 IMPLANT
TRANSDUCER W/STOPCOCK (MISCELLANEOUS) ×2 IMPLANT
TUBING CIL FLEX 10 FLL-RA (TUBING) ×2 IMPLANT
WIRE COUGAR XT STRL 190CM (WIRE) ×1 IMPLANT

## 2021-08-15 NOTE — Progress Notes (Signed)
6Fr sheath removed from right femoral artery. ? ?Manual pressure held for 20 minutes ? ?Hemostasis achieved.  ? ?Right groin soft. No active bleeding. No hematoma.  ? ?Vital signs stable ? ?4X4 and tegaderm applied to right groin  ? ?Post procedure bleeding precautions reviewed with patient.  ? ?Patient resting comfortably with no pain.    ?

## 2021-08-15 NOTE — Interval H&P Note (Signed)
History and Physical Interval Note: ? ?08/15/2021 ?7:39 AM ? ?Kyle Avila  has presented today for surgery, with the diagnosis of unstable angina.  The various methods of treatment have been discussed with the patient and family. After consideration of risks, benefits and other options for treatment, the patient has consented to  Procedure(s): ?LEFT HEART CATH AND CORS/GRAFTS ANGIOGRAPHY (N/A) as a surgical intervention.  [name] history has been reviewed, patient examined, no change in status, stable for surgery.  I have reviewed the patient's chart and labs.  Questions were answered to the patient's satisfaction.   ? ?Cath Lab Visit (complete for each Cath Lab visit) ? ?Clinical Evaluation Leading to the Procedure:  ? ?ACS: No. ? ?Non-ACS:   ? ?Anginal Classification: CCS III ? ?Anti-ischemic medical therapy: Maximal Therapy (2 or more classes of medications) ? ?Non-Invasive Test Results: No non-invasive testing performed ? ?Prior CABG: Previous CABG ? ? ? ? ? ? ? ?Lauree Chandler ? ? ?

## 2021-08-16 ENCOUNTER — Encounter (HOSPITAL_COMMUNITY): Payer: Self-pay | Admitting: Cardiovascular Disease

## 2021-08-16 DIAGNOSIS — I255 Ischemic cardiomyopathy: Secondary | ICD-10-CM

## 2021-08-16 DIAGNOSIS — I200 Unstable angina: Secondary | ICD-10-CM

## 2021-08-16 DIAGNOSIS — I5042 Chronic combined systolic (congestive) and diastolic (congestive) heart failure: Secondary | ICD-10-CM

## 2021-08-16 DIAGNOSIS — T82855A Stenosis of coronary artery stent, initial encounter: Secondary | ICD-10-CM | POA: Diagnosis not present

## 2021-08-16 DIAGNOSIS — I25700 Atherosclerosis of coronary artery bypass graft(s), unspecified, with unstable angina pectoris: Secondary | ICD-10-CM | POA: Diagnosis not present

## 2021-08-16 DIAGNOSIS — I5020 Unspecified systolic (congestive) heart failure: Secondary | ICD-10-CM

## 2021-08-16 DIAGNOSIS — N184 Chronic kidney disease, stage 4 (severe): Secondary | ICD-10-CM | POA: Diagnosis not present

## 2021-08-16 DIAGNOSIS — E1122 Type 2 diabetes mellitus with diabetic chronic kidney disease: Secondary | ICD-10-CM | POA: Diagnosis not present

## 2021-08-16 DIAGNOSIS — I130 Hypertensive heart and chronic kidney disease with heart failure and stage 1 through stage 4 chronic kidney disease, or unspecified chronic kidney disease: Secondary | ICD-10-CM | POA: Diagnosis not present

## 2021-08-16 DIAGNOSIS — I442 Atrioventricular block, complete: Secondary | ICD-10-CM

## 2021-08-16 DIAGNOSIS — I4819 Other persistent atrial fibrillation: Secondary | ICD-10-CM | POA: Diagnosis not present

## 2021-08-16 DIAGNOSIS — M3214 Glomerular disease in systemic lupus erythematosus: Secondary | ICD-10-CM | POA: Diagnosis not present

## 2021-08-16 DIAGNOSIS — I2582 Chronic total occlusion of coronary artery: Secondary | ICD-10-CM | POA: Diagnosis not present

## 2021-08-16 LAB — CBC
HCT: 37.3 % — ABNORMAL LOW (ref 39.0–52.0)
Hemoglobin: 12.7 g/dL — ABNORMAL LOW (ref 13.0–17.0)
MCH: 36.3 pg — ABNORMAL HIGH (ref 26.0–34.0)
MCHC: 34 g/dL (ref 30.0–36.0)
MCV: 106.6 fL — ABNORMAL HIGH (ref 80.0–100.0)
Platelets: 139 10*3/uL — ABNORMAL LOW (ref 150–400)
RBC: 3.5 MIL/uL — ABNORMAL LOW (ref 4.22–5.81)
RDW: 13.1 % (ref 11.5–15.5)
WBC: 5.1 10*3/uL (ref 4.0–10.5)
nRBC: 0 % (ref 0.0–0.2)

## 2021-08-16 LAB — BASIC METABOLIC PANEL
Anion gap: 4 — ABNORMAL LOW (ref 5–15)
BUN: 43 mg/dL — ABNORMAL HIGH (ref 8–23)
CO2: 22 mmol/L (ref 22–32)
Calcium: 8.5 mg/dL — ABNORMAL LOW (ref 8.9–10.3)
Chloride: 113 mmol/L — ABNORMAL HIGH (ref 98–111)
Creatinine, Ser: 2.24 mg/dL — ABNORMAL HIGH (ref 0.61–1.24)
GFR, Estimated: 30 mL/min — ABNORMAL LOW (ref >60)
Glucose, Bld: 98 mg/dL (ref 70–99)
Potassium: 4.5 mmol/L (ref 3.5–5.1)
Sodium: 139 mmol/L (ref 135–145)

## 2021-08-16 MED ORDER — APIXABAN 5 MG PO TABS: 5.0000 mg | ORAL_TABLET | Freq: Two times a day (BID) | ORAL | Status: DC

## 2021-08-16 MED ORDER — LOSARTAN POTASSIUM 50 MG PO TABS
50.0000 mg | ORAL_TABLET | Freq: Every day | ORAL | Status: DC
  Filled 2021-08-16 (×2): qty 1

## 2021-08-16 MED ORDER — LOSARTAN POTASSIUM 50 MG PO TABS
50.0000 mg | ORAL_TABLET | Freq: Every day | ORAL | Status: DC
Start: 2021-08-16 — End: 2022-02-20

## 2021-08-16 MED ORDER — AMLODIPINE BESYLATE 2.5 MG PO TABS: 7.5000 mg | ORAL_TABLET | Freq: Every day | ORAL | 2 refills | Status: DC

## 2021-08-16 MED FILL — Heparin Sod (Porcine)-NaCl IV Soln 1000 Unit/500ML-0.9%: INTRAVENOUS | Qty: 1000 | Status: AC

## 2021-08-16 NOTE — Discharge Instructions (Signed)
Post Cardiac Catheterization: ?NO HEAVY LIFTING OR SEXUAL ACTIVITY X 7 DAYS. ?NO DRIVING X 3-5 DAYS. ?NO SOAKING BATHS, HOT TUBS, POOLS, ETC., X 7 DAYS. ? ?Groin Site Care: ?Refer to this sheet in the next few weeks. These instructions provide you with information on caring for yourself after your procedure. Your caregiver may also give you more specific instructions. Your treatment has been planned according to current medical practices, but problems sometimes occur. Call your caregiver if you have any problems or questions after your procedure. ?HOME CARE INSTRUCTIONS ?You may shower 24 hours after the procedure. Remove the bandage (dressing) and gently wash the site with plain soap and water. Gently pat the site dry.  ?Do not apply powder or lotion to the site.  ?Do not sit in a bathtub, swimming pool, or whirlpool for 5 to 7 days.  ?No bending, squatting, or lifting anything over 10 pounds (4.5 kg) as directed by your caregiver.  ?Inspect the site at least twice daily.  ?Do not drive home if you are discharged the same day of the procedure. Have someone else drive you.  ?What to expect: ?Any bruising will usually fade within 1 to 2 weeks.  ?Blood that collects in the tissue (hematoma) may be painful to the touch. It should usually decrease in size and tenderness within 1 to 2 weeks.  ?SEEK IMMEDIATE MEDICAL CARE IF: ?You have unusual pain at the groin site or down the affected leg.  ?You have redness, warmth, swelling, or pain at the groin site.  ?You have drainage (other than a small amount of blood on the dressing).  ?You have chills.  ?You have a fever or persistent symptoms for more than 72 hours.  ?You have a fever and your symptoms suddenly get worse.  ?Your leg becomes pale, cool, tingly, or numb.  ?You have heavy bleeding from the site. Hold pressure on the site.  ?

## 2021-08-16 NOTE — Discharge Summary (Addendum)
?Discharge Summary  ?  ?Patient ID: Kyle Avila ?MRN: [id number]; DOB: March 11, 1946 ? ?Admit date: 08/15/2021 ?Discharge date: 08/16/2021 ? ?PCP:  [name], MD ?  ?[facility] Providers ?Cardiologist:  [name], MD  ?Electrophysiologist:  [name], MD  { ? ?Discharge Diagnoses  ?  ?Principal Problem: ?  Unstable angina ([place]) ?Active Problems: ?  Hypertension ?  Hyperlipidemia ?  CAD (coronary artery disease) ?  CKD (chronic kidney disease) stage 4, GFR 15-29 ml/min (HCC) ?  Persistent atrial fibrillation ([place]) ?  Ischemic cardiomyopathy ?  Chronic combined systolic and diastolic CHF (congestive heart failure) ([place]) ?  Complete heart block s/p PPM ? ? ? ?Diagnostic Studies/Procedures  ?  ?Left Cardiac Catheterization 08/15/2021: ?  LM lesion is 100% stenosed. ?  Mid LAD to Dist LAD lesion is 40% stenosed. ?  Mid LAD lesion is 75% stenosed. ?  Ost LAD lesion is 100% stenosed. ?  Ost Ramus to Ramus lesion is 100% stenosed. ?  Ost RCA lesion is 100% stenosed. ?  Prox Graft lesion is 100% stenosed. ?  1st Mrg lesion is 100% stenosed. ?  Dist Graft lesion is 80% stenosed. ?  Prox Graft lesion is 99% stenosed. ?  Ost Cx to Prox Cx lesion is 10% stenosed. ?  Non-stenotic RPDA lesion was previously treated. ?  Scoring balloon angioplasty was performed using a BALLN WOLVERINE 4.00X10. ?  Scoring balloon angioplasty was performed using a BALLN WOLVERINE 4.00X10. ?  Post intervention, there is a 10% residual stenosis. ?  Post intervention, there is a 30% residual stenosis. ?  LIMA and is normal in caliber. ?  and is large. ?  The graft exhibits no disease. ?  ?Severe triple vessel CAD s/p CABG with 2/3 patent bypass grafts ?Chronic occlusion of the ostial LAD. The mid and distal LAD fills from the patent LIMA graft. Stable moderate stenosis at the anastomosis of the LIMA gaft to the LAD. ?Chronic occlusion of the intermediate branch that fills from left to left collaterals supplied by the LIMA to LAD.   ?Patent proximal and mid Circumflex stents. Known occlusion of SVG to OM (non injected). ?Chronic occlusion ostial RCA (non injected). ?Patent SVG to PDA. Severe stenosis in the stented segment in the proximal SVG and severe stenosis in the stented segment in the distal SVG.  ?Successful PTCA with scoring balloon angioplasty of both stented segments in the SVG to PDA. ?Normal LVEDP. ?  ?Recommendations: Continue Plavix. I will have him take ASA in the am but this can be stopped once his Eliquis is resumed.  ? ?Diagnostic ?Dominance: Right ?Intervention ? ? ?_____________ ?  ?History of Present Illness   ?  ?Kyle Avila is a 76 y.o. male with a history of CAD s/p CABG in 1988 and redo CABG in 1996 with multiple subsequent PCI (DES x2 to LCX in 2009, DES to SVG to PDA in 2016, DES x2 to SVG to PDA in 2018, DES to RPDA and cutting balloon POBA to SVG to RPDA in 09/2019 due to in-stent restenosis), ischemic cardiomyopathy/ chronic combined CHF with EF of 45-50% on last Echo in 07/2019, complete heart block s/p PPM in 2007 with gen change in 2016, persistent atrial fibrillation on Eliquis, carotid artery disease s/p right CEA in 2002, hypertension, hyperlipidemia, CKD stage IV, and lupus who is followed by Dr. Burt Knack.  ? ?Patient was recently seen by Dr. Burt Knack on 07/30/2021 after an ED visit the week prior for elevated troponin.  He had called into our office and notified us that he was sent being increasing chest discomfort that was responsive to amlodipine.  He had an outpatient troponin drawn which was elevated and was sent to the ED for further evaluation.  He had 2 more troponins drawn there and levels were flat at 77 >> 86 >> 85.  He was discharged from the ED with close cardiology follow-up.  At visit with Dr. Burt Knack, he reported that his chest pain symptoms had improved since going back on an increased dose of amlodipine.  He reported feeling a lot better.  He denied any dyspnea, orthopnea, PND, palpitations,  leg swelling.  Described the discomfort in his chest as a pressure that radiated into his arms as it progressed.  Given improvement in with increased dose of amlodipine, close monitoring was recommended with plan for catheterizations if symptoms worsened again.  He had some recurrent chest pain as well as fatigue with minimal activity and therefore decision was made to proceed with cardiac catheterization ? ?Hospital Course  ?   ?Consultants: None  ? ?Chest Pain ?CAD ?Patient has a long history of CAD s/p CABG in 1988 and redo CABG in 1996 with multiple subsequent PCI.  He presented to [facility] for planned outpatient cardiac catheterization on/20/2023 given progressive chest pain. Cath showed severe triple native vessel CAD with known occlusion of the SVG to OM but patent LIMA to LAD and SVG to PDA.  He had severe stenosis in the stented segment of the proximal SVG to PDA as well as severe stenosis in the stented segment in the distal SVG to PDA.  He underwent successful PTCA with scoring balloon angioplasty of both stented segments.  He tolerated the procedure well with no recurrent chest pain.  We will make sure he can ambulate with cardiac rehab without any problems prior to discharge.  Continue home antianginals: Amlodipine 7.5 mg daily, Imdur 120 mg daily, Toprol XL 50 mg twice daily, and Ranexa 1000 mg twice daily.  Continue Plavix 75 mg daily.  He is also on Eliquis given history of atrial fibrillation. Continue Repatha. ? ?Ischemic Cardiomyopathy ?Chronic Combined CHF ?Last Echo in 07/2019 showed 45-50% with severe hypokinesis of the basal-mid inferior lateral wall and grade 2-systolic dysfunction.  Stable with no issues of volume overload at this admission.  LVEDP was normal on cath.  Continue Lasix 80 mg in the morning and 40 mg in the afternoon.  Continue home Toprol XL 50 mg twice daily and Losartan '50mg'$  daily (dosing per his Nephrologist. ? ?Persistent Atrial Fibrillation ?S/p 07/17/2021 with recurrent  atrial fibrillation.  There were talks of starting Amiodarone and then reattempting cardioversion; however, patient was advised to hold off on this until after cardiac catheterization. Continue Toprol as above.  Continue Eliquis 5 mg twice daily. He has already been referred to the Atrial Fibrillation Clinic. ? ?Complete Heart Block s/p PPM ?Followed by Dr. Caryl Comes. ? ?Hypertension ?BP mildly elevated at times but relatively well controlled. Continue Amlodipine, Imdur, Losartan, and Toprol-XL as above. ? ?Hyperlipidemia ?On Repatha. Continue. ? ?CKD Stage IV ?Creatinine stable at 2.24 today (2.44 on pre-procedural labs). Consider repeat BMET at follow-up visit. Followed by Nephrology. ? ?Patient seen and examined by Dr. Marlou Porch today and determined to be stable for discharge. Outpatient follow-up arranged. Medications as below. ? ?Did the patient have an acute coronary syndrome (MI, NSTEMI, STEMI, etc) this admission?:  Yes                              ? ?  AHA/ACC Clinical Performance & Quality Measures: ?Aspirin prescribed? - No - on Eliquis ?ADP Receptor Inhibitor (Plavix/Clopidogrel, Brilinta/Ticagrelor or Effient/Prasugrel) prescribed (includes medically managed patients)? - Yes ?Beta Blocker prescribed? - Yes ?High Intensity Statin (Lipitor 40-'80mg'$  or Crestor 20-'40mg'$ ) prescribed? - No - intolerant (on Repatha) ?EF assessed during THIS hospitalization? - No - presented for outpatient cardiac catheterization ?For EF <40%, was ACEI/ARB prescribed? - Not Applicable (EF >/= 73%) ?For EF <40%, Aldosterone Antagonist (Spironolactone or Eplerenone) prescribed? - Not Applicable (EF >/= 42%) ?Cardiac Rehab Phase II ordered (including medically managed patients)? - Yes  ? ? ?Discharge Vitals ?Blood pressure (!) 145/72, pulse 61, temperature 98.7 ?F (37.1 ?C), temperature source Oral, resp. rate 20, height '5\' 8"'$  (1.727 m), weight 73.5 kg, SpO2 93 %.  ?Filed Weights  ? 08/15/21 0600 08/15/21 0623  ?Weight: 73.5 kg 73.5 kg   ? ?Physical Exam per MD: ?General: 76 y.o. Caucasian male resting comfortably in no acute distress. ?HEENT: Normocephalic and atraumatic. Sclera clear.  ?Neck: Supple. No JVD. ?Heart: RRR. Distinct S1 and S2. I-

## 2021-08-16 NOTE — Progress Notes (Signed)
Pt sts he has been walking hall numerous times without CP. Sts normally he would not get angina until about 1/4 mile. Discussed with pt Plavix, restrictions, exercise, NTG, diet (especially low sodium as he is fighting fluid retention), daily wts, and CPRII. Pt voiced reception and requests his referral be sent to [place]. He hopes to get started soon.  ?[id number] ?Yves Dill CES, ACSM ?10:16 AM ?08/16/2021 ? ? ?

## 2021-08-20 ENCOUNTER — Encounter: Payer: Self-pay | Admitting: Cardiovascular Disease

## 2021-08-21 DIAGNOSIS — R29898 Other symptoms and signs involving the musculoskeletal system: Secondary | ICD-10-CM | POA: Diagnosis not present

## 2021-08-21 DIAGNOSIS — H6121 Impacted cerumen, right ear: Secondary | ICD-10-CM | POA: Diagnosis not present

## 2021-08-22 NOTE — Telephone Encounter (Signed)
OK to try 12 oz of gatorade and see how that works. 24 oz would definitely be too much fluid.  ?

## 2021-08-23 ENCOUNTER — Encounter (HOSPITAL_COMMUNITY): Payer: Self-pay | Admitting: Cardiovascular Disease

## 2021-08-24 ENCOUNTER — Other Ambulatory Visit: Payer: Self-pay | Admitting: Cardiovascular Disease

## 2021-08-26 ENCOUNTER — Encounter: Payer: Self-pay | Admitting: Internal Medicine

## 2021-08-26 ENCOUNTER — Other Ambulatory Visit: Payer: Self-pay

## 2021-08-26 DIAGNOSIS — I4819 Other persistent atrial fibrillation: Secondary | ICD-10-CM

## 2021-08-26 DIAGNOSIS — H353211 Exudative age-related macular degeneration, right eye, with active choroidal neovascularization: Secondary | ICD-10-CM | POA: Diagnosis not present

## 2021-08-26 NOTE — Telephone Encounter (Signed)
Patient is back in AF according to report on 08/26/21. ? ? ? ?

## 2021-08-26 NOTE — Telephone Encounter (Signed)
Pt called stating he sent a transmission and would like the information to go to Dr. Caryl Comes and [place]. He want them to look and see if he is out of A-fib.  ?

## 2021-08-27 DIAGNOSIS — N184 Chronic kidney disease, stage 4 (severe): Secondary | ICD-10-CM | POA: Diagnosis not present

## 2021-08-27 NOTE — Progress Notes (Signed)
?Cardiology Office Note:   ? ?Date:  08/28/2021  ? ?ID:  Kyle Avila, DOB Jul 07, 1945, MRN [id number] ? ?PCP:  [name], MD  ?[facility] Providers ?Cardiologist:  [name], MD ?Electrophysiologist:  [name], MD    ?Referring MD: [name], MD  ? ?Chief Complaint:  Hospitalization Follow-up (Status post PCI) ?  ? ?Patient Profile: ?Coronary artery disease ?S/p CABG in 1988; redo in 1996 ?S/p multiple PCI procedures (DES x 2 to LCx in 2009, DES to S-PDA in 2016, DES x 2 to S-PDA in 2018) ?S/p DES to RPDA and cutting balloon POBA to S-RPDA (ISR) 6/21 ?S/p POBA to both stented segments of S-PDA in 07/2021 ?Combined systolic and diastolic CHF ?Ischemic CM ?Echocardiogram 2/18: EF 45-50 ?Echocardiogram 4/21: EF 45-50 ?Persistent atrial fibrillation  ?S/p DCCV 06/2021 >> ERAF ?Carotid artery disease ?S/p R CEA ?[place] 6/22: R CEA patent  ?CHB, s/p pacemaker ?Chronic kidney disease ?Hypertension ?Hyperlipidemia  ?  ?Prior CV studies: ?LEFT HEART CATH AND CORS/GRAFTS ANGIOGRAPHY, LEFT HEART CATH AND CORS/GRAFTS ANGIOGRAPHY 08/15/2021 ?Severe triple vessel CAD s/p CABG with 2/3 patent bypass grafts ?Chronic occlusion of the ostial LAD. The mid and distal LAD fills from the patent LIMA graft. Stable moderate stenosis at the anastomosis of the LIMA gaft to the LAD ?Chronic occlusion of the intermediate branch that fills from left to left collaterals supplied by the LIMA to LAD. ?Patent proximal and mid Circumflex stents. Known occlusion of SVG to OM (non injected) ?Chronic occlusion ostial RCA (non injected) ?Patent SVG to PDA. Severe stenosis in the stented segment in the proximal SVG and severe stenosis in the stented segment in the distal SVG. ?Successful PTCA with scoring balloon angioplasty of both stented segments in the SVG to PDA ?Normal LVEDP ?  ? ? ?VAS US CAROTID DUPLEX BILATERAL 10/25/2020 ?R CEA site patent; L ICA 1-39  ? ?Carotid US 02/24/20 ?Bilateral ICA 1-39 ?  ? ?Echocardiogram  08/19/19 ?EF 45-50, Gr 2 DD, inf-lat HK, normal RVSF, RVSP 31.5, mod BAE, mild MR, trivial AI ?  ?Echo 06/24/2016 ?Mild concentric LVH, EF 45-50, inferolateral/inferior/inferoseptal hypokinesis, grade 1 diastolic dysfunction, mild MR, mild LAE, mildly reduced RVSF, mild TR, PASP 32 ?   ? ?History of Present Illness:   ?Kyle Avila is a 76 y.o. male with the above problem list.  He developed atrial fibrillation noted on device interrogation earlier this year.  He was started on anticoagulation and underwent DCCV.  He had return of AFib after his DCCV.  He then went to the ED in March 2023 with chest pain.  His hsTrops were mildly elevated and flat.  He was fairly stable on adjusted antianginals when he saw Dr. Burt Knack for f/u in April 2023.  However, he developed worsening chest pain and was set up for cardiac catheterization  08/15/21.  He had 2/3 patent bypass grafts.  His S-PDA had severe ISR and both stented segments were treated with POBA.  Post PCI course was uneventful.  The notes indicate there has been discussion regarding +/- Amiodarone with repeat DCCV after PCI.  His device was interrogated recently and continued to demonstrate atrial fibrillation.  He returns for f/u.  He is here alone.  Overall, he feels much better.  His degree of angina has significantly improved.  He still has some with some activities but not as much as he did prior to his PCI.  He has not had shortness of breath, fatigue, orthopnea.  He has some chronic leg edema  that is worse on the right.  He has not had syncope. ?   ?Past Medical History:  ?Diagnosis Date  ? Arthritis   ? "some in my back" (04/14/2017)  ? CAD (coronary artery disease)   ? a.  s/p CABG 1988 with redo in 1996 with multiple PCIs since then  ? Carotid artery occlusion   ? s/p RCEA  ? CKD (chronic kidney disease), stage IV ([place])   ? Complete heart block (HCC)   ? s/p PTVDP  ? Gout   ? HTN (hypertension)   ? Hyperlipidemia   ? Lupus ([place])   ? "that's why my kidneys  are gone; lupus attacked them" (04/14/2017)  ? Mitral insufficiency   ? Myocardial infarction [facility])   ? "I've had some mild one" (04/14/2017)  ? Presence of permanent cardiac pacemaker   ? Skin cancer   ? "nose"  ? VT (ventricular tachycardia)-nonsustained 05/20/2011  ? ?Current Medications: ?Current Meds  ?Medication Sig  ? acetaminophen (TYLENOL) 500 MG tablet Take 1,000 mg by mouth every 6 (six) hours as needed for moderate pain.  ? allopurinol (ZYLOPRIM) 100 MG tablet Take 100 mg by mouth at bedtime.  ? amLODipine (NORVASC) 2.5 MG tablet Take 3 tablets (7.5 mg total) by mouth daily.  ? apixaban (ELIQUIS) 5 MG TABS tablet Take 1 tablet (5 mg total) by mouth 2 (two) times daily.  ? Bevacizumab (AVASTIN IV) Inject into the vein. Eye injections- once monthly in the right eye  ? clopidogrel (PLAVIX) 75 MG tablet Take 75 mg by mouth at bedtime.  ? fluticasone (FLONASE) 50 MCG/ACT nasal spray Place 2 sprays into both nostrils daily as needed for allergies or rhinitis.  ? furosemide (LASIX) 80 MG tablet Take 1 tablet by mouth each morning and 1/2 tablet daily in the afternoon until fluid is resolved, then decrease to 1 tablet daily thereafter  ? isosorbide mononitrate (IMDUR) 120 MG 24 hr tablet TAKE 1 TABLET (120 MG TOTAL) BY MOUTH DAILY.  ? ketoconazole (NIZORAL) 2 % cream Apply 1 application. topically daily as needed for irritation.  ? losartan (COZAAR) 50 MG tablet Take 1 tablet (50 mg total) by mouth daily.  ? Mepolizumab (NUCALA) 100 MG/ML SOAJ Inject 1 mL (100 mg total) into the skin every 28 (twenty-eight) days.  ? nitroGLYCERIN (NITROSTAT) 0.4 MG SL tablet PLACE 1 TABLET UNDER THE TONGUE EVERY 5 MINUTES AS NEEDED FOR CHEST PAIN  ? ranolazine (RANEXA) 1000 MG SR tablet Take 1 tablet (1,000 mg total) by mouth 2 (two) times daily.  ? tamsulosin (FLOMAX) 0.4 MG CAPS Take 0.4 mg by mouth daily.  ? [DISCONTINUED] metoprolol succinate (TOPROL-XL) 50 MG 24 hr tablet TAKE 1 TABLET TWO TIMES DAILY. TAKE WITH OR  IMMEDIATELY FOLLOWING MEALS.  ? [DISCONTINUED] REPATHA SURECLICK 026 MG/ML SOAJ Inject 140 mg into the skin every 14 (fourteen) days.  ?  ?Allergies:   Patient has no known allergies.  ? ?Social History  ? ?Tobacco Use  ? Smoking status: Never  ?  Passive exposure: Yes  ? Smokeless tobacco: Never  ?Vaping Use  ? Vaping Use: Never used  ?Substance Use Topics  ? Alcohol use: Yes  ?  Alcohol/week: 3.0 standard drinks  ?  Types: 1 Glasses of wine, 1 Cans of beer, 1 Standard drinks or equivalent per week  ?  Comment: \  ? Drug use: No  ?  ?Family Hx: ?[name] family history includes Cancer in his mother; Heart attack in his father; Heart disease  in his father. ? ?Review of Systems  ?Constitutional: Negative for malaise/fatigue.  ?Gastrointestinal:  Negative for hematochezia.  ?Genitourinary:  Negative for hematuria.   ? ?EKGs/Labs/Other Test Reviewed:   ? ?EKG:  EKG is  ordered today.  The ekg ordered today demonstrates underlying atrial fibrillation, V paced, HR 63 ? ?Recent Labs: ?08/16/2021: BUN 43; Creatinine, Ser 2.24; Hemoglobin 12.7; Platelets 139; Potassium 4.5; Sodium 139  ? ?Recent Lipid Panel ?No results for input(s): CHOL, TRIG, HDL, VLDL, LDLCALC, LDLDIRECT in the last 8760 hours.  ? ?Risk Assessment/Calculations:   ? ?CHA2DS2-VASc Score = 5  ? This indicates a 7.2% annual risk of stroke. ?[name] score is based upon: ?CHF History: 1 ?HTN History: 1 ?Diabetes History: 0 ?Stroke History: 0 ?Vascular Disease History: 1 ?Age Score: 2 ?Gender Score: 0 ?  ? ?    ?Physical Exam:   ? ?VS:  BP (!) 104/50   Pulse 63   Ht '5\' 8"'$  (1.727 m)   Wt 169 lb 12.8 oz (77 kg)   SpO2 98%   BMI 25.82 kg/m?    ? ?Wt Readings from Last 3 Encounters:  ?08/28/21 169 lb 12.8 oz (77 kg)  ?08/15/21 162 lb (73.5 kg)  ?07/30/21 168 lb 3.2 oz (76.3 kg)  ?  ?Constitutional:   ?   Appearance: Healthy appearance. Not in distress.  ?Neck:  ?   Vascular: No JVR. JVD normal.  ?Pulmonary:  ?   Effort: Pulmonary effort is normal.  ?    Breath sounds: No wheezing. No rales.  ?Cardiovascular:  ?   Normal rate. Regular rhythm. Normal S1. Normal S2.   ?   Murmurs: There is no murmur.  ?   Comments: Right groin without hematoma or bruit ?Edema: ?   Periphera

## 2021-08-28 ENCOUNTER — Ambulatory Visit: Payer: Medicare HMO | Admitting: Physician Assistant

## 2021-08-28 ENCOUNTER — Encounter: Payer: Self-pay | Admitting: Physician Assistant

## 2021-08-28 VITALS — BP 104/50 | HR 63 | Ht 68.0 in | Wt 169.8 lb

## 2021-08-28 DIAGNOSIS — I25119 Atherosclerotic heart disease of native coronary artery with unspecified angina pectoris: Secondary | ICD-10-CM

## 2021-08-28 DIAGNOSIS — I442 Atrioventricular block, complete: Secondary | ICD-10-CM

## 2021-08-28 DIAGNOSIS — N184 Chronic kidney disease, stage 4 (severe): Secondary | ICD-10-CM

## 2021-08-28 DIAGNOSIS — I10 Essential (primary) hypertension: Secondary | ICD-10-CM | POA: Diagnosis not present

## 2021-08-28 DIAGNOSIS — I4819 Other persistent atrial fibrillation: Secondary | ICD-10-CM | POA: Diagnosis not present

## 2021-08-28 DIAGNOSIS — E782 Mixed hyperlipidemia: Secondary | ICD-10-CM

## 2021-08-28 DIAGNOSIS — I5020 Unspecified systolic (congestive) heart failure: Secondary | ICD-10-CM | POA: Diagnosis not present

## 2021-08-28 MED ORDER — METOPROLOL SUCCINATE ER 50 MG PO TB24: ORAL_TABLET | ORAL | 3 refills | Status: DC

## 2021-08-28 MED ORDER — REPATHA SURECLICK 140 MG/ML ~~LOC~~ SOAJ: 140.0000 mg | SUBCUTANEOUS | 3 refills | Status: DC

## 2021-08-28 NOTE — Assessment & Plan Note (Signed)
In August, LDL optimal.  Continue Repatha 140 mg every 2 weeks. ?

## 2021-08-28 NOTE — Assessment & Plan Note (Signed)
History of bypass in 1988 and redo in 1996 and multiple PCI procedures.  Most recently, he underwent balloon angioplasty to both stented segments in the vein graft to the PDA.  Residual disease includes a chronically occluded intermediate branch which fills from left to left collaterals supplied by the LIMA to the LAD and known occlusion of the vein graft to the OM.  His mid circumflex stent remains patent.  LIMA-LAD is patent.  He notes that he is having much less angina that he had previously.  He feels much better.  Continue Plavix any 5 mg daily, amlodipine 7.5 mg daily, Imdur 120 mg daily, Toprol-XL 50 mg twice daily, Ranexa 1000 mg twice daily.  He is not on aspirin as he is on Eliquis.  Follow-up 3-4 months. ?

## 2021-08-28 NOTE — Assessment & Plan Note (Signed)
He is followed by nephrology.  Labs were obtained from 08/27/2021: Creatinine 2.6, potassium 5.1.  Previous creatinine/14/23 was 3.27.  Continue follow-up with nephrology. ?

## 2021-08-28 NOTE — Assessment & Plan Note (Signed)
Blood pressure is well controlled.  Continue amlodipine 7.5 mg daily, Imdur 120 mg daily, losartan 50 mg daily, Toprol-XL 50 mg twice daily, Lasix 80 mg in the morning and 40 mg in the afternoon ?

## 2021-08-28 NOTE — Assessment & Plan Note (Signed)
EF 45-50.  Volume status remains stable.  Continue Lasix 80 mg in the morning and 40 mg in the evening, Imdur 120 mg daily, Toprol-XL 50 mg twice daily, losartan 50 mg daily. ?

## 2021-08-28 NOTE — Progress Notes (Signed)
See office note from today. ?Patient is not symptomatic. ?Reviewed with Dr. Burt Knack. ?Will pursue rate control strategy and avoid amiodarone at this time. ?The pt will contact us if symptoms develop that are likely from AFib. ?Richardson Dopp, PA-C    ?08/28/2021 1:12 PM   ?

## 2021-08-28 NOTE — Assessment & Plan Note (Addendum)
Continue follow-up with EP as planned ?

## 2021-08-28 NOTE — Patient Instructions (Addendum)
Medication Instructions:  ?Your physician recommends that you continue on your current medications as directed. Please refer to the Current Medication list given to you today. ? ?*If you need a refill on your cardiac medications before your next appointment, please call your pharmacy* ? ? ?Lab Work: ?None ordered ? ?If you have labs (blood work) drawn today and your tests are completely normal, you will receive your results only by: ?MyChart Message (if you have MyChart) OR ?A paper copy in the mail ?If you have any lab test that is abnormal or we need to change your treatment, we will call you to review the results. ? ? ?Testing/Procedures: ?None ordered ? ? ?Follow-Up: ?At [facility], you and your health needs are our priority.  As part of our continuing mission to provide you with exceptional heart care, we have created designated Provider Care Teams.  These Care Teams include your primary Cardiologist (physician) and Advanced Practice Providers (APPs -  Physician Assistants and Nurse Practitioners) who all work together to provide you with the care you need, when you need it. ? ?We recommend signing up for the patient portal called "MyChart".  Sign up information is provided on this After Visit Summary.  MyChart is used to connect with patients for Virtual Visits (Telemedicine).  Patients are able to view lab/test results, encounter notes, upcoming appointments, etc.  Non-urgent messages can be sent to your provider as well.   ?To learn more about what you can do with MyChart, go to [email or web address].   ? ?Your next appointment:   ?AS SCHEDULED ? ?The format for your next appointment:   ?In Person ? ?Provider:   ?[name], MD   ? ? ?Other Instructions ? ? ?Important Information About Sugar ? ? ? ? ?  ?

## 2021-08-28 NOTE — Assessment & Plan Note (Signed)
His heart rate is controlled.  Overall, he does not seem to be symptomatic with atrial fibrillation.  We discussed the pros and cons of proceeding with rhythm control strategy with amiodarone.  As he is asymptomatic, I favor proceeding with rate control strategy.  I did review this with Dr. Burt Knack via secure chat.  He agreed with rate control as long as the patient is asymptomatic.  The patient is comfortable with rate control strategy at this time.  He knows to contact us should anything change that would cause concern for symptoms with atrial fibrillation.  We can certainly consider amiodarone and rhythm control strategy at that point.  He is less than 8 years old and his weight is over 60 kg.  Continue Eliquis 5 mg twice daily.  Follow-up in 3 to 4 months. ?

## 2021-09-09 ENCOUNTER — Encounter: Payer: Self-pay | Admitting: Cardiovascular Disease

## 2021-09-09 DIAGNOSIS — I4819 Other persistent atrial fibrillation: Secondary | ICD-10-CM

## 2021-09-10 ENCOUNTER — Encounter: Payer: Self-pay | Admitting: Cardiovascular Disease

## 2021-09-10 MED ORDER — AMIODARONE HCL 200 MG PO TABS: ORAL_TABLET | ORAL | 1 refills | Status: DC

## 2021-09-10 NOTE — Telephone Encounter (Signed)
I would recommend starting amiodarone 200 mg BID x 30 days, then 200 mg daily. Please refer to AF clinic 2 weeks after starting amiodarone and will ask them to arrange cardioversion if he remains out of rhythm. thanks ?

## 2021-09-16 NOTE — Telephone Encounter (Signed)
Please let pt know that we have been in touch with Dr. Caryl Comes and he recommends getting an echocardiogram to look at the size of his atria.  This will help if we have to decide to pursue ablation to control his atrial fibrillation. I will put the order in. Please schedule an echocardiogram. Richardson Dopp, PA-C    09/16/2021 3:08 PM

## 2021-09-16 NOTE — Addendum Note (Signed)
Addended byKathlen Mody, Nicki Reaper T on: 09/16/2021 03:10 PM   Modules accepted: Orders

## 2021-09-17 ENCOUNTER — Encounter: Payer: Self-pay | Admitting: Internal Medicine

## 2021-09-17 NOTE — Telephone Encounter (Signed)
Please find out which hospital Mr. Espin was admitted to in Jan 2023 and request his echocardiogram report. Hold off on scheduling his echocardiogram until we can see what his echocardiogram report from then showed. Richardson Dopp, PA-C    09/17/2021 4:40 PM

## 2021-09-18 ENCOUNTER — Encounter: Payer: Self-pay | Admitting: Pulmonary Disease

## 2021-09-18 ENCOUNTER — Ambulatory Visit (INDEPENDENT_AMBULATORY_CARE_PROVIDER_SITE_OTHER): Payer: Medicare HMO

## 2021-09-18 DIAGNOSIS — I442 Atrioventricular block, complete: Secondary | ICD-10-CM | POA: Diagnosis not present

## 2021-09-18 NOTE — Telephone Encounter (Signed)
Patient already had a visit with Dr Silas Flood. Was unable to schedule a visit with Dr Loanne Drilling but was seen by Dr Silas Flood in office. Nothing further needed for this message.

## 2021-09-18 NOTE — Telephone Encounter (Signed)
Dr. Silas Flood please advise on the following My Chart message:   [name] [facility] (supporting Hunsucker, Bonna Gains, MD) 52 minutes ago (1:01 PM)   Sorry to say my cough persists. Need to know where the flam is coming from when I cough. My sinuses or my lungs. A mouthful of flam while eating isn't very appealing.  Isn't there an antibiotic that can clear this mess up and a lower does to keep it away. Thanks for your time [name]  Thank you

## 2021-09-19 LAB — CUP PACEART REMOTE DEVICE CHECK
Battery Impedance: 903 Ohm
Battery Remaining Longevity: 58 mo
Battery Voltage: 2.77 V
Brady Statistic AP VP Percent: 64 %
Brady Statistic AP VS Percent: 0 %
Brady Statistic AS VP Percent: 33 %
Brady Statistic AS VS Percent: 3 %
Date Time Interrogation Session: 20230524091547
Implantable Lead Implant Date: 20070831
Implantable Lead Implant Date: 20070831
Implantable Lead Location: 753859
Implantable Lead Location: 753860
Implantable Lead Model: 4469
Implantable Lead Model: 4470
Implantable Lead Serial Number: 485949
Implantable Lead Serial Number: 553665
Implantable Pulse Generator Implant Date: 20160518
Lead Channel Impedance Value: 449 Ohm
Lead Channel Impedance Value: 464 Ohm
Lead Channel Pacing Threshold Amplitude: 0.625 V
Lead Channel Pacing Threshold Amplitude: 0.625 V
Lead Channel Pacing Threshold Pulse Width: 0.4 ms
Lead Channel Pacing Threshold Pulse Width: 0.4 ms
Lead Channel Setting Pacing Amplitude: 2 V
Lead Channel Setting Pacing Amplitude: 2.5 V
Lead Channel Setting Pacing Pulse Width: 0.4 ms
Lead Channel Setting Sensing Sensitivity: 2.8 mV

## 2021-09-20 ENCOUNTER — Encounter: Payer: Self-pay | Admitting: *Deleted

## 2021-09-20 NOTE — Telephone Encounter (Signed)
Just reviewing his chart; is this cough unchanged compared to when he was seen in office in March or is he now producing more phlegm? Is he having significant nasal congestion/drainage or notable postnasal drip? How is the swelling in his legs (increased or stable)? Weights stable? Thanks.

## 2021-09-24 DIAGNOSIS — L570 Actinic keratosis: Secondary | ICD-10-CM | POA: Diagnosis not present

## 2021-09-24 DIAGNOSIS — N184 Chronic kidney disease, stage 4 (severe): Secondary | ICD-10-CM | POA: Diagnosis not present

## 2021-09-24 DIAGNOSIS — L821 Other seborrheic keratosis: Secondary | ICD-10-CM | POA: Diagnosis not present

## 2021-09-24 DIAGNOSIS — D0472 Carcinoma in situ of skin of left lower limb, including hip: Secondary | ICD-10-CM | POA: Diagnosis not present

## 2021-09-24 DIAGNOSIS — L814 Other melanin hyperpigmentation: Secondary | ICD-10-CM | POA: Diagnosis not present

## 2021-09-24 DIAGNOSIS — D1801 Hemangioma of skin and subcutaneous tissue: Secondary | ICD-10-CM | POA: Diagnosis not present

## 2021-09-24 DIAGNOSIS — Z85828 Personal history of other malignant neoplasm of skin: Secondary | ICD-10-CM | POA: Diagnosis not present

## 2021-09-24 DIAGNOSIS — D235 Other benign neoplasm of skin of trunk: Secondary | ICD-10-CM | POA: Diagnosis not present

## 2021-09-24 MED ORDER — FLUTICASONE PROPIONATE 50 MCG/ACT NA SUSP: 2.0000 | Freq: Every day | NASAL | 4 refills | Status: DC | PRN

## 2021-09-24 MED ORDER — AZELASTINE HCL 0.1 % NA SOLN: 2.0000 | Freq: Two times a day (BID) | NASAL | 4 refills | Status: DC

## 2021-09-25 DIAGNOSIS — I129 Hypertensive chronic kidney disease with stage 1 through stage 4 chronic kidney disease, or unspecified chronic kidney disease: Secondary | ICD-10-CM | POA: Diagnosis not present

## 2021-09-25 DIAGNOSIS — I25118 Atherosclerotic heart disease of native coronary artery with other forms of angina pectoris: Secondary | ICD-10-CM | POA: Diagnosis not present

## 2021-09-25 DIAGNOSIS — E785 Hyperlipidemia, unspecified: Secondary | ICD-10-CM | POA: Diagnosis not present

## 2021-09-25 NOTE — Progress Notes (Signed)
Primary Care Physician: [name], MD Primary Cardiologist: Dr Burt Knack Primary Electrophysiologist: Dr Caryl Comes Referring Physician: Dr Cristal Ford is a 76 y.o. male with a history of CAD, CHF/ischemic CM, carotid artery disease, CHB s/p PPM, CKD, HTN, HLD, atrial fibrillation who presents for follow up in the [facility].  The patient was initially diagnosed with atrial fibrillation 06/20/21 after the device clinic received an alert for an ongoing episode starting 05/26/21. Patient has a CHADS2VASC score of 5. He reports that since late January, he has felt much more fatigued with exertion. He does admit to snoring and daytime somnolence. Patient did have [place] 08/15/21 which showed severe three vessel disease, had POBA to both stented segments of SVG-PDA. He states his chest pain has greatly improved but he is still very fatigued. He was loaded on amiodarone in anticipation of repeat DCCV.   Today, he denies symptoms of palpitations, chest pain, shortness of breath, orthopnea, PND, lower extremity edema, dizziness, presyncope, syncope, bleeding, or neurologic sequela. The patient is tolerating medications without difficulties and is otherwise without complaint today.    Atrial Fibrillation Risk Factors:  he does have symptoms or diagnosis of sleep apnea. he is deferring sleep study for now. he does not have a history of rheumatic fever.   he has a BMI of Body mass index is 25.42 kg/m.Marland Kitchen Filed Weights   09/26/21 0826  Weight: 75.8 kg     Family History  Problem Relation Age of Onset   Heart attack Father        74s   Heart disease Father        Heart Disease before age 55   Cancer Mother      Atrial Fibrillation Management history:  Previous antiarrhythmic drugs: amiodarone  Previous cardioversions: 07/17/21 Previous ablations: none CHADS2VASC score: 5 Anticoagulation history: Eliquis   Past Medical History:  Diagnosis Date    Arthritis    "some in my back" (04/14/2017)   CAD (coronary artery disease)    a.  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; lupus attacked them" (04/14/2017)   Mitral insufficiency    Myocardial infarction ([place])    "I've had some mild one" (04/14/2017)   Presence of permanent cardiac pacemaker    Skin cancer    "nose"   VT (ventricular tachycardia)-nonsustained 05/20/2011   Past Surgical History:  Procedure Laterality Date   ACHILLES TENDON REPAIR Right    CARDIAC CATHETERIZATION     CARDIAC CATHETERIZATION N/A 11/03/2014   Procedure: Left Heart Cath and Cors/Grafts Angiography;  Surgeon: [name], MD;  Location: [facility];  Service: Cardiovascular;  Laterality: N/A;   CARDIAC CATHETERIZATION N/A 11/03/2014   Procedure: Coronary Stent Intervention;  Surgeon: [name], MD;  Location: [place] [facility];  Service: Cardiovascular;  Laterality: N/A;   CARDIOVERSION N/A 07/17/2021   Procedure: CARDIOVERSION;  Surgeon: [name], MD;  Location: [facility];  Service: Cardiovascular;  Laterality: N/A;   CAROTID ENDARTERECTOMY  2002   "? side"   CATARACT EXTRACTION W/ INTRAOCULAR LENS IMPLANT     "? side"  CORONARY ANGIOPLASTY WITH STENT PLACEMENT  04/14/2017   CORONARY ANGIOPLASTY WITH STENT PLACEMENT  11/03/2014   SVG PVA   CORONARY ARTERY BYPASS GRAFT     with redo cabg   CORONARY BALLOON ANGIOPLASTY N/A 08/15/2021   Procedure: CORONARY BALLOON ANGIOPLASTY;  Surgeon: Burnell Blanks, MD;  Location: [place] [facility];  Service: Cardiovascular;  Laterality: N/A;   CORONARY STENT INTERVENTION N/A 10/18/2019   Procedure: CORONARY STENT INTERVENTION;  Surgeon: [name], MD;  Location: [place] [facility];   Service: Cardiovascular;  Laterality: N/A;   CORONARY/GRAFT ANGIOGRAPHY N/A 10/18/2019   Procedure: CORONARY/GRAFT ANGIOGRAPHY;  Surgeon: [name], MD;  Location: [place] [facility];  Service: Cardiovascular;  Laterality: N/A;   EP IMPLANTABLE DEVICE N/A 09/13/2014   Procedure: PPM Generator Changeout;  Surgeon: Deboraha Sprang, MD;  Location: [place] [facility];  Service: Cardiovascular;  Laterality: N/A;   LEFT HEART CATH AND CORS/GRAFTS ANGIOGRAPHY N/A 04/14/2017   Procedure: LEFT HEART CATH AND CORS/GRAFTS ANGIOGRAPHY;  Surgeon: [name], MD;  Location: [place] [facility];  Service: Cardiovascular;  Laterality: N/A;   LEFT HEART CATH AND CORS/GRAFTS ANGIOGRAPHY N/A 08/15/2021   Procedure: LEFT HEART CATH AND CORS/GRAFTS ANGIOGRAPHY;  Surgeon: Burnell Blanks, MD;  Location: [facility] [facility];  Service: Cardiovascular;  Laterality: N/A;   MOHS SURGERY     "just outside my nose"   PACEMAKER INSERTION  2007   TONSILLECTOMY  1958    Current Outpatient Medications  Medication Sig Dispense Refill   acetaminophen (TYLENOL) 500 MG tablet Take 1,000 mg by mouth every 6 (six) hours as needed for moderate pain.     allopurinol (ZYLOPRIM) 100 MG tablet Take 100 mg by mouth at bedtime.     amiodarone (PACERONE) 200 MG tablet Take 1 TABLET by mouth twice daily for 30 days, then decrease to one tablet daily thereafter 90 tablet 1   amLODipine (NORVASC) 2.5 MG tablet Take 3 tablets (7.5 mg total) by mouth daily. 90 tablet 2   apixaban (ELIQUIS) 5 MG TABS tablet Take 1 tablet (5 mg total) by mouth 2 (two) times daily. 180 tablet 2   azelastine (ASTELIN) 0.1 % nasal spray Place 2 sprays into both nostrils 2 (two) times daily. Use in each nostril as directed 30 mL 4   Bevacizumab (AVASTIN IV) Inject into the vein. Eye injections- once monthly in the right eye     clopidogrel (PLAVIX) 75 MG tablet Take 75 mg by mouth at bedtime.     fluticasone (FLONASE) 50 MCG/ACT nasal spray Place 2  sprays into both nostrils daily as needed for allergies or rhinitis. (Patient taking differently: Place 2 sprays into both nostrils in the morning and at bedtime.) 16 g 4   furosemide (LASIX) 80 MG tablet Take 1 tablet by mouth each morning and 1/2 tablet daily in the afternoon until fluid is resolved, then decrease to 1 tablet daily thereafter 90 tablet 3   isosorbide mononitrate (IMDUR) 120 MG 24 hr tablet TAKE 1 TABLET (120 MG TOTAL) BY MOUTH DAILY. 90 tablet 2   ketoconazole (NIZORAL) 2 % cream Apply 1 application. topically daily as needed for irritation.     losartan (COZAAR) 50 MG tablet Take 1 tablet (50 mg total) by mouth daily.     Mepolizumab (NUCALA) 100 MG/ML SOAJ Inject 1 mL (100 mg total) into the skin every 28 (twenty-eight) days. 3 mL 1   metoprolol succinate (TOPROL-XL) 50 MG 24 hr tablet TAKE 1 TABLET  TWO TIMES DAILY. TAKE WITH OR IMMEDIATELY FOLLOWING MEALS. 180 tablet 3   nitroGLYCERIN (NITROSTAT) 0.4 MG SL tablet PLACE 1 TABLET UNDER THE TONGUE EVERY 5 MINUTES AS NEEDED FOR CHEST PAIN 25 tablet 3   REPATHA SURECLICK 989 MG/ML SOAJ Inject 140 mg into the skin every 14 (fourteen) days. 6 mL 3   tamsulosin (FLOMAX) 0.4 MG CAPS Take 0.4 mg by mouth daily.     ranolazine (RANEXA) 1000 MG SR tablet Taking 1/2 tablet by mouth twice daily     No current facility-administered medications for this encounter.    No Known Allergies  Social History   Socioeconomic History   Marital status: Married    Spouse name: Not on file   Number of children: Not on file   Years of education: Not on file   Highest education level: Not on file  Occupational History   Not on file  Tobacco Use   Smoking status: Never    Passive exposure: Yes   Smokeless tobacco: Never  Vaping Use   Vaping Use: Never used  Substance and Sexual Activity   Alcohol use: Yes    Alcohol/week: 3.0 standard drinks    Types: 1 Glasses of wine, 1 Cans of beer, 1 Standard drinks or equivalent per week    Comment:  \   Drug use: No   Sexual activity: Not Currently  Other Topics Concern   Not on file  Social History Narrative   Not on file   Social Determinants of Health   Financial Resource Strain: Not on file  Food Insecurity: Not on file  Transportation Needs: Not on file  Physical Activity: Not on file  Stress: Not on file  Social Connections: Not on file  Intimate Partner Violence: Not on file     ROS- All systems are reviewed and negative except as per the HPI above.  Physical Exam: Vitals:   09/26/21 0826  BP: 130/64  Pulse: 62  Weight: 75.8 kg  Height: '5\' 8"'$  (1.727 m)     GEN- The patient is a well appearing elderly male, alert and oriented x 3 today.   HEENT-head normocephalic, atraumatic, sclera clear, conjunctiva pink, hearing intact, trachea midline. Lungs- Clear to ausculation bilaterally, normal work of breathing Heart- Regular rate and rhythm, no murmurs, rubs or gallops  GI- soft, NT, ND, + BS Extremities- no clubbing, cyanosis, or edema MS- no significant deformity or atrophy Skin- no rash or lesion Psych- euthymic mood, full affect Neuro- strength and sensation are intact   Wt Readings from Last 3 Encounters:  09/26/21 75.8 kg  08/28/21 77 kg  08/15/21 73.5 kg    EKG today demonstrates  V pacing with underlying coarse afib vs atypical atrial flutter Vent. rate 62 BPM PR interval * ms QRS duration 204 ms QT/QTcB 528/535 ms  Echo 08/19/19 demonstrated   1. Left ventricular ejection fraction, by estimation, is 45 to 50%. The  left ventricle has mildly decreased function. The left ventricle has no  regional wall motion abnormalities. Left ventricular diastolic parameters are consistent with Grade II diastolic dysfunction (pseudonormalization). Elevated left atrial pressure. There is severe hypokinesis of the left ventricular, basal-mid inferolateral wall.   2. Right ventricular systolic function is normal. The right ventricular  size is mildly enlarged.  There is mildly elevated pulmonary artery  systolic pressure. The estimated right ventricular systolic pressure is  21.1 mmHg.   3. Left atrial size was moderately dilated.   4. Right atrial size was moderately  dilated.   5. The mitral valve is normal in structure. Mild mitral valve  regurgitation. No evidence of mitral stenosis.   6. The aortic valve is normal in structure. Aortic valve regurgitation is trivial. No aortic stenosis is present.   7. The inferior vena cava is normal in size with greater than 50%  respiratory variability, suggesting right atrial pressure of 3 mmHg.   Comparison(s): Prior images reviewed side by side. The left ventricular function is unchanged. The left ventricular wall motion abnormality is unchanged.   Epic records are reviewed at length today  CHA2DS2-VASc Score = 5  [name] score is based upon: CHF History: 1 HTN History: 1 Diabetes History: 0 Stroke History: 0 Vascular Disease History: 1 Age Score: 2 Gender Score: 0       ASSESSMENT AND PLAN: 1. Persistent Atrial Fibrillation (ICD10:  I48.19) [name] CHA2DS2-VASc score is 5, indicating a 7.2% annual risk of stroke.   Patient in symptomatic afib despite good rate control. Will plan for DCCV after amiodarone loading.  Continue amiodarone 200 mg BID. Decrease to once daily after DCCV. Continue Eliquis 5 mg BID, patient denies any missed doses. Continue Toprol 50 mg BID  2. Secondary Hypercoagulable State (ICD10:  D68.69) The patient is at significant risk for stroke/thromboembolism based upon his CHA2DS2-VASc Score of 5.  Start Apixaban (Eliquis).   3. CAD S/p CABG and multiple PCIs On Repatha  No anginal symptoms today. Followed by Dr Burt Knack.  4. Snoring/daytime somnolence  Patient has deferred sleep study.   5. Chronic combined systolic and diastolic CHF Volume status stable.  6. HTN Stable, no changes today.   Follow up in the AF clinic post DCCV.    [facility] [address] [place], [place] [address] [phone] 09/26/2021 8:47 AM

## 2021-09-25 NOTE — H&P (View-Only) (Signed)
Primary Care Physician: [name], MD Primary Cardiologist: Dr Burt Knack Primary Electrophysiologist: Dr Caryl Comes Referring Physician: Dr Cristal Ford is a 76 y.o. male with a history of CAD, CHF/ischemic CM, carotid artery disease, CHB s/p PPM, CKD, HTN, HLD, atrial fibrillation who presents for follow up in the [facility].  The patient was initially diagnosed with atrial fibrillation 06/20/21 after the device clinic received an alert for an ongoing episode starting 05/26/21. Patient has a CHADS2VASC score of 5. He reports that since late January, he has felt much more fatigued with exertion. He does admit to snoring and daytime somnolence. Patient did have [place] 08/15/21 which showed severe three vessel disease, had POBA to both stented segments of SVG-PDA. He states his chest pain has greatly improved but he is still very fatigued. He was loaded on amiodarone in anticipation of repeat DCCV.   Today, he denies symptoms of palpitations, chest pain, shortness of breath, orthopnea, PND, lower extremity edema, dizziness, presyncope, syncope, bleeding, or neurologic sequela. The patient is tolerating medications without difficulties and is otherwise without complaint today.    Atrial Fibrillation Risk Factors:  he does have symptoms or diagnosis of sleep apnea. he is deferring sleep study for now. he does not have a history of rheumatic fever.   he has a BMI of Body mass index is 25.42 kg/m.Marland Kitchen Filed Weights   09/26/21 0826  Weight: 75.8 kg     Family History  Problem Relation Age of Onset   Heart attack Father        71s   Heart disease Father        Heart Disease before age 50   Cancer Mother      Atrial Fibrillation Management history:  Previous antiarrhythmic drugs: amiodarone  Previous cardioversions: 07/17/21 Previous ablations: none CHADS2VASC score: 5 Anticoagulation history: Eliquis   Past Medical History:  Diagnosis Date    Arthritis    "some in my back" (04/14/2017)   CAD (coronary artery disease)    a.  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; lupus attacked them" (04/14/2017)   Mitral insufficiency    Myocardial infarction ([place])    "I've had some mild one" (04/14/2017)   Presence of permanent cardiac pacemaker    Skin cancer    "nose"   VT (ventricular tachycardia)-nonsustained 05/20/2011   Past Surgical History:  Procedure Laterality Date   ACHILLES TENDON REPAIR Right    CARDIAC CATHETERIZATION     CARDIAC CATHETERIZATION N/A 11/03/2014   Procedure: Left Heart Cath and Cors/Grafts Angiography;  Surgeon: [name], MD;  Location: [facility] [facility];  Service: Cardiovascular;  Laterality: N/A;   CARDIAC CATHETERIZATION N/A 11/03/2014   Procedure: Coronary Stent Intervention;  Surgeon: [name], MD;  Location: [place] [facility];  Service: Cardiovascular;  Laterality: N/A;   CARDIOVERSION N/A 07/17/2021   Procedure: CARDIOVERSION;  Surgeon: [name], MD;  Location: [facility];  Service: Cardiovascular;  Laterality: N/A;   CAROTID ENDARTERECTOMY  2002   "? side"   CATARACT EXTRACTION W/ INTRAOCULAR LENS IMPLANT     "? side"  CORONARY ANGIOPLASTY WITH STENT PLACEMENT  04/14/2017   CORONARY ANGIOPLASTY WITH STENT PLACEMENT  11/03/2014   SVG PVA   CORONARY ARTERY BYPASS GRAFT     with redo cabg   CORONARY BALLOON ANGIOPLASTY N/A 08/15/2021   Procedure: CORONARY BALLOON ANGIOPLASTY;  Surgeon: Burnell Blanks, MD;  Location: [place] [facility];  Service: Cardiovascular;  Laterality: N/A;   CORONARY STENT INTERVENTION N/A 10/18/2019   Procedure: CORONARY STENT INTERVENTION;  Surgeon: [name], MD;  Location: [facility];   Service: Cardiovascular;  Laterality: N/A;   CORONARY/GRAFT ANGIOGRAPHY N/A 10/18/2019   Procedure: CORONARY/GRAFT ANGIOGRAPHY;  Surgeon: [name], MD;  Location: [facility] [facility] [facility];  Service: Cardiovascular;  Laterality: N/A;   EP IMPLANTABLE DEVICE N/A 09/13/2014   Procedure: PPM Generator Changeout;  Surgeon: Deboraha Sprang, MD;  Location: [facility];  Service: Cardiovascular;  Laterality: N/A;   LEFT HEART CATH AND CORS/GRAFTS ANGIOGRAPHY N/A 04/14/2017   Procedure: LEFT HEART CATH AND CORS/GRAFTS ANGIOGRAPHY;  Surgeon: [name], MD;  Location: [facility];  Service: Cardiovascular;  Laterality: N/A;   LEFT HEART CATH AND CORS/GRAFTS ANGIOGRAPHY N/A 08/15/2021   Procedure: LEFT HEART CATH AND CORS/GRAFTS ANGIOGRAPHY;  Surgeon: Burnell Blanks, MD;  Location: [facility];  Service: Cardiovascular;  Laterality: N/A;   MOHS SURGERY     "just outside my nose"   PACEMAKER INSERTION  2007   TONSILLECTOMY  1958    Current Outpatient Medications  Medication Sig Dispense Refill   acetaminophen (TYLENOL) 500 MG tablet Take 1,000 mg by mouth every 6 (six) hours as needed for moderate pain.     allopurinol (ZYLOPRIM) 100 MG tablet Take 100 mg by mouth at bedtime.     amiodarone (PACERONE) 200 MG tablet Take 1 TABLET by mouth twice daily for 30 days, then decrease to one tablet daily thereafter 90 tablet 1   amLODipine (NORVASC) 2.5 MG tablet Take 3 tablets (7.5 mg total) by mouth daily. 90 tablet 2   apixaban (ELIQUIS) 5 MG TABS tablet Take 1 tablet (5 mg total) by mouth 2 (two) times daily. 180 tablet 2   azelastine (ASTELIN) 0.1 % nasal spray Place 2 sprays into both nostrils 2 (two) times daily. Use in each nostril as directed 30 mL 4   Bevacizumab (AVASTIN IV) Inject into the vein. Eye injections- once monthly in the right eye     clopidogrel (PLAVIX) 75 MG tablet Take 75 mg by mouth at bedtime.     fluticasone (FLONASE) 50 MCG/ACT nasal spray Place 2  sprays into both nostrils daily as needed for allergies or rhinitis. (Patient taking differently: Place 2 sprays into both nostrils in the morning and at bedtime.) 16 g 4   furosemide (LASIX) 80 MG tablet Take 1 tablet by mouth each morning and 1/2 tablet daily in the afternoon until fluid is resolved, then decrease to 1 tablet daily thereafter 90 tablet 3   isosorbide mononitrate (IMDUR) 120 MG 24 hr tablet TAKE 1 TABLET (120 MG TOTAL) BY MOUTH DAILY. 90 tablet 2   ketoconazole (NIZORAL) 2 % cream Apply 1 application. topically daily as needed for irritation.     losartan (COZAAR) 50 MG tablet Take 1 tablet (50 mg total) by mouth daily.     Mepolizumab (NUCALA) 100 MG/ML SOAJ Inject 1 mL (100 mg total) into the skin every 28 (twenty-eight) days. 3 mL 1   metoprolol succinate (TOPROL-XL) 50 MG 24 hr tablet TAKE 1 TABLET  TWO TIMES DAILY. TAKE WITH OR IMMEDIATELY FOLLOWING MEALS. 180 tablet 3   nitroGLYCERIN (NITROSTAT) 0.4 MG SL tablet PLACE 1 TABLET UNDER THE TONGUE EVERY 5 MINUTES AS NEEDED FOR CHEST PAIN 25 tablet 3   REPATHA SURECLICK 086 MG/ML SOAJ Inject 140 mg into the skin every 14 (fourteen) days. 6 mL 3   tamsulosin (FLOMAX) 0.4 MG CAPS Take 0.4 mg by mouth daily.     ranolazine (RANEXA) 1000 MG SR tablet Taking 1/2 tablet by mouth twice daily     No current facility-administered medications for this encounter.    No Known Allergies  Social History   Socioeconomic History   Marital status: Married    Spouse name: Not on file   Number of children: Not on file   Years of education: Not on file   Highest education level: Not on file  Occupational History   Not on file  Tobacco Use   Smoking status: Never    Passive exposure: Yes   Smokeless tobacco: Never  Vaping Use   Vaping Use: Never used  Substance and Sexual Activity   Alcohol use: Yes    Alcohol/week: 3.0 standard drinks    Types: 1 Glasses of wine, 1 Cans of beer, 1 Standard drinks or equivalent per week    Comment:  \   Drug use: No   Sexual activity: Not Currently  Other Topics Concern   Not on file  Social History Narrative   Not on file   Social Determinants of Health   Financial Resource Strain: Not on file  Food Insecurity: Not on file  Transportation Needs: Not on file  Physical Activity: Not on file  Stress: Not on file  Social Connections: Not on file  Intimate Partner Violence: Not on file     ROS- All systems are reviewed and negative except as per the HPI above.  Physical Exam: Vitals:   09/26/21 0826  BP: 130/64  Pulse: 62  Weight: 75.8 kg  Height: '5\' 8"'$  (1.727 m)     GEN- The patient is a well appearing elderly male, alert and oriented x 3 today.   HEENT-head normocephalic, atraumatic, sclera clear, conjunctiva pink, hearing intact, trachea midline. Lungs- Clear to ausculation bilaterally, normal work of breathing Heart- Regular rate and rhythm, no murmurs, rubs or gallops  GI- soft, NT, ND, + BS Extremities- no clubbing, cyanosis, or edema MS- no significant deformity or atrophy Skin- no rash or lesion Psych- euthymic mood, full affect Neuro- strength and sensation are intact   Wt Readings from Last 3 Encounters:  09/26/21 75.8 kg  08/28/21 77 kg  08/15/21 73.5 kg    EKG today demonstrates  V pacing with underlying coarse afib vs atypical atrial flutter Vent. rate 62 BPM PR interval * ms QRS duration 204 ms QT/QTcB 528/535 ms  Echo 08/19/19 demonstrated   1. Left ventricular ejection fraction, by estimation, is 45 to 50%. The  left ventricle has mildly decreased function. The left ventricle has no  regional wall motion abnormalities. Left ventricular diastolic parameters are consistent with Grade II diastolic dysfunction (pseudonormalization). Elevated left atrial pressure. There is severe hypokinesis of the left ventricular, basal-mid inferolateral wall.   2. Right ventricular systolic function is normal. The right ventricular  size is mildly enlarged.  There is mildly elevated pulmonary artery  systolic pressure. The estimated right ventricular systolic pressure is  76.1 mmHg.   3. Left atrial size was moderately dilated.   4. Right atrial size was moderately  dilated.   5. The mitral valve is normal in structure. Mild mitral valve  regurgitation. No evidence of mitral stenosis.   6. The aortic valve is normal in structure. Aortic valve regurgitation is trivial. No aortic stenosis is present.   7. The inferior vena cava is normal in size with greater than 50%  respiratory variability, suggesting right atrial pressure of 3 mmHg.   Comparison(s): Prior images reviewed side by side. The left ventricular function is unchanged. The left ventricular wall motion abnormality is unchanged.   Epic records are reviewed at length today  CHA2DS2-VASc Score = 5  [name] score is based upon: CHF History: 1 HTN History: 1 Diabetes History: 0 Stroke History: 0 Vascular Disease History: 1 Age Score: 2 Gender Score: 0       ASSESSMENT AND PLAN: 1. Persistent Atrial Fibrillation (ICD10:  I48.19) [name] CHA2DS2-VASc score is 5, indicating a 7.2% annual risk of stroke.   Patient in symptomatic afib despite good rate control. Will plan for DCCV after amiodarone loading.  Continue amiodarone 200 mg BID. Decrease to once daily after DCCV. Continue Eliquis 5 mg BID, patient denies any missed doses. Continue Toprol 50 mg BID  2. Secondary Hypercoagulable State (ICD10:  D68.69) The patient is at significant risk for stroke/thromboembolism based upon his CHA2DS2-VASc Score of 5.  Start Apixaban (Eliquis).   3. CAD S/p CABG and multiple PCIs On Repatha  No anginal symptoms today. Followed by Dr Burt Knack.  4. Snoring/daytime somnolence  Patient has deferred sleep study.   5. Chronic combined systolic and diastolic CHF Volume status stable.  6. HTN Stable, no changes today.   Follow up in the AF clinic post DCCV.    [facility] [address] [place], [place] [address] [phone] 09/26/2021 8:47 AM

## 2021-09-26 ENCOUNTER — Ambulatory Visit (HOSPITAL_COMMUNITY)
Admission: RE | Admit: 2021-09-26 | Discharge: 2021-09-26 | Disposition: A | Discharge status: Home / Self Care | Payer: Medicare HMO | Source: Ambulatory Visit | Attending: Physician Assistant | Admitting: Physician Assistant

## 2021-09-26 VITALS — BP 130/64 | HR 62 | Ht 68.0 in | Wt 167.2 lb

## 2021-09-26 DIAGNOSIS — I4819 Other persistent atrial fibrillation: Secondary | ICD-10-CM | POA: Insufficient documentation

## 2021-09-26 DIAGNOSIS — Z951 Presence of aortocoronary bypass graft: Secondary | ICD-10-CM | POA: Insufficient documentation

## 2021-09-26 DIAGNOSIS — R0683 Snoring: Secondary | ICD-10-CM | POA: Diagnosis not present

## 2021-09-26 DIAGNOSIS — I2510 Atherosclerotic heart disease of native coronary artery without angina pectoris: Secondary | ICD-10-CM | POA: Insufficient documentation

## 2021-09-26 DIAGNOSIS — I110 Hypertensive heart disease with heart failure: Secondary | ICD-10-CM | POA: Insufficient documentation

## 2021-09-26 DIAGNOSIS — I5042 Chronic combined systolic (congestive) and diastolic (congestive) heart failure: Secondary | ICD-10-CM | POA: Diagnosis not present

## 2021-09-26 DIAGNOSIS — D6869 Other thrombophilia: Secondary | ICD-10-CM | POA: Diagnosis not present

## 2021-09-26 DIAGNOSIS — Z7901 Long term (current) use of anticoagulants: Secondary | ICD-10-CM | POA: Insufficient documentation

## 2021-09-26 DIAGNOSIS — E785 Hyperlipidemia, unspecified: Secondary | ICD-10-CM | POA: Insufficient documentation

## 2021-09-26 LAB — BASIC METABOLIC PANEL
Anion gap: 6 (ref 5–15)
BUN: 60 mg/dL — ABNORMAL HIGH (ref 8–23)
CO2: 24 mmol/L (ref 22–32)
Calcium: 9.1 mg/dL (ref 8.9–10.3)
Chloride: 110 mmol/L (ref 98–111)
Creatinine, Ser: 2.8 mg/dL — ABNORMAL HIGH (ref 0.61–1.24)
GFR, Estimated: 23 mL/min — ABNORMAL LOW (ref >60)
Glucose, Bld: 107 mg/dL — ABNORMAL HIGH (ref 70–99)
Potassium: 4.8 mmol/L (ref 3.5–5.1)
Sodium: 140 mmol/L (ref 135–145)

## 2021-09-26 LAB — CBC
HCT: 39.2 % (ref 39.0–52.0)
Hemoglobin: 12.6 g/dL — ABNORMAL LOW (ref 13.0–17.0)
MCH: 35 pg — ABNORMAL HIGH (ref 26.0–34.0)
MCHC: 32.1 g/dL (ref 30.0–36.0)
MCV: 108.9 fL — ABNORMAL HIGH (ref 80.0–100.0)
Platelets: 159 10*3/uL (ref 150–400)
RBC: 3.6 MIL/uL — ABNORMAL LOW (ref 4.22–5.81)
RDW: 13.2 % (ref 11.5–15.5)
WBC: 3.7 10*3/uL — ABNORMAL LOW (ref 4.0–10.5)
nRBC: 0 % (ref 0.0–0.2)

## 2021-09-26 MED ORDER — RANOLAZINE ER 1000 MG PO TB12: ORAL_TABLET | ORAL | Status: DC

## 2021-09-26 NOTE — Addendum Note (Signed)
Encounter addended by: Juluis Mire, RN on: 09/26/2021 9:12 AM  Actions taken: Clinical Note Signed, Pharmacy for encounter modified, Order list changed

## 2021-09-26 NOTE — Patient Instructions (Addendum)
Day of cardioversion reduce Amiodarone to '200mg'$  once a day   Cardioversion scheduled for Tuesday, June 13th  - Arrive at the [facility] and go to admitting at [place] not eat or drink anything after midnight the night prior to your procedure.  - Take all your morning medication (except diabetic medications) with a sip of water prior to arrival.  - You will not be able to drive home after your procedure.  - Do NOT miss any doses of your blood thinner - if you should miss a dose please notify our office immediately.  - If you feel as if you go back into normal rhythm prior to scheduled cardioversion, please notify our office immediately. If your procedure is canceled in the cardioversion suite you will be charged a cancellation fee.

## 2021-09-27 ENCOUNTER — Encounter (HOSPITAL_COMMUNITY): Payer: Self-pay | Admitting: Cardiology

## 2021-09-30 DIAGNOSIS — I129 Hypertensive chronic kidney disease with stage 1 through stage 4 chronic kidney disease, or unspecified chronic kidney disease: Secondary | ICD-10-CM | POA: Diagnosis not present

## 2021-09-30 DIAGNOSIS — E875 Hyperkalemia: Secondary | ICD-10-CM | POA: Diagnosis not present

## 2021-09-30 DIAGNOSIS — R809 Proteinuria, unspecified: Secondary | ICD-10-CM | POA: Diagnosis not present

## 2021-09-30 DIAGNOSIS — N184 Chronic kidney disease, stage 4 (severe): Secondary | ICD-10-CM | POA: Diagnosis not present

## 2021-09-30 DIAGNOSIS — M329 Systemic lupus erythematosus, unspecified: Secondary | ICD-10-CM | POA: Diagnosis not present

## 2021-09-30 DIAGNOSIS — N2581 Secondary hyperparathyroidism of renal origin: Secondary | ICD-10-CM | POA: Diagnosis not present

## 2021-10-01 ENCOUNTER — Ambulatory Visit (HOSPITAL_COMMUNITY): Payer: Medicare HMO | Attending: Internal Medicine

## 2021-10-01 ENCOUNTER — Other Ambulatory Visit (HOSPITAL_COMMUNITY): Payer: Medicare HMO

## 2021-10-01 DIAGNOSIS — I4819 Other persistent atrial fibrillation: Secondary | ICD-10-CM | POA: Diagnosis not present

## 2021-10-01 LAB — ECHOCARDIOGRAM COMPLETE
Area-P 1/2: 3.49 cm2
S' Lateral: 4.05 cm

## 2021-10-01 NOTE — Progress Notes (Signed)
Remote pacemaker transmission.   

## 2021-10-02 ENCOUNTER — Encounter: Payer: Self-pay | Admitting: Physician Assistant

## 2021-10-02 DIAGNOSIS — I340 Nonrheumatic mitral (valve) insufficiency: Secondary | ICD-10-CM

## 2021-10-02 HISTORY — DX: Nonrheumatic mitral (valve) insufficiency: I34.0

## 2021-10-08 ENCOUNTER — Other Ambulatory Visit: Payer: Self-pay

## 2021-10-08 ENCOUNTER — Ambulatory Visit (HOSPITAL_BASED_OUTPATIENT_CLINIC_OR_DEPARTMENT_OTHER): Payer: Medicare HMO | Admitting: Anesthesiology

## 2021-10-08 ENCOUNTER — Ambulatory Visit (HOSPITAL_COMMUNITY): Payer: Medicare HMO | Admitting: Anesthesiology

## 2021-10-08 ENCOUNTER — Encounter (HOSPITAL_COMMUNITY)
Admission: RE | Disposition: A | Discharge status: Home / Self Care | Payer: Self-pay | Source: Home / Self Care | Attending: Cardiology

## 2021-10-08 ENCOUNTER — Ambulatory Visit (HOSPITAL_COMMUNITY)
Admission: RE | Admit: 2021-10-08 | Discharge: 2021-10-08 | Disposition: A | Discharge status: Home / Self Care | Payer: Medicare HMO | Attending: Cardiology | Admitting: Cardiology

## 2021-10-08 DIAGNOSIS — I252 Old myocardial infarction: Secondary | ICD-10-CM

## 2021-10-08 DIAGNOSIS — I129 Hypertensive chronic kidney disease with stage 1 through stage 4 chronic kidney disease, or unspecified chronic kidney disease: Secondary | ICD-10-CM | POA: Diagnosis not present

## 2021-10-08 DIAGNOSIS — I255 Ischemic cardiomyopathy: Secondary | ICD-10-CM | POA: Insufficient documentation

## 2021-10-08 DIAGNOSIS — E785 Hyperlipidemia, unspecified: Secondary | ICD-10-CM | POA: Insufficient documentation

## 2021-10-08 DIAGNOSIS — I4819 Other persistent atrial fibrillation: Secondary | ICD-10-CM

## 2021-10-08 DIAGNOSIS — I4891 Unspecified atrial fibrillation: Secondary | ICD-10-CM | POA: Diagnosis not present

## 2021-10-08 DIAGNOSIS — I2510 Atherosclerotic heart disease of native coronary artery without angina pectoris: Secondary | ICD-10-CM

## 2021-10-08 DIAGNOSIS — N184 Chronic kidney disease, stage 4 (severe): Secondary | ICD-10-CM | POA: Insufficient documentation

## 2021-10-08 DIAGNOSIS — Z951 Presence of aortocoronary bypass graft: Secondary | ICD-10-CM | POA: Insufficient documentation

## 2021-10-08 DIAGNOSIS — I5042 Chronic combined systolic (congestive) and diastolic (congestive) heart failure: Secondary | ICD-10-CM | POA: Diagnosis not present

## 2021-10-08 DIAGNOSIS — Z950 Presence of cardiac pacemaker: Secondary | ICD-10-CM | POA: Insufficient documentation

## 2021-10-08 DIAGNOSIS — I130 Hypertensive heart and chronic kidney disease with heart failure and stage 1 through stage 4 chronic kidney disease, or unspecified chronic kidney disease: Secondary | ICD-10-CM | POA: Diagnosis not present

## 2021-10-08 DIAGNOSIS — Z7722 Contact with and (suspected) exposure to environmental tobacco smoke (acute) (chronic): Secondary | ICD-10-CM

## 2021-10-08 DIAGNOSIS — I739 Peripheral vascular disease, unspecified: Secondary | ICD-10-CM | POA: Diagnosis not present

## 2021-10-08 DIAGNOSIS — Z955 Presence of coronary angioplasty implant and graft: Secondary | ICD-10-CM | POA: Diagnosis not present

## 2021-10-08 DIAGNOSIS — N189 Chronic kidney disease, unspecified: Secondary | ICD-10-CM | POA: Diagnosis not present

## 2021-10-08 HISTORY — PX: CARDIOVERSION: SHX1299

## 2021-10-08 SURGERY — CARDIOVERSION
Anesthesia: General

## 2021-10-08 MED ORDER — LIDOCAINE 2% (20 MG/ML) 5 ML SYRINGE
INTRAMUSCULAR | Status: DC | PRN
  Administered 2021-10-08: 60 mg via INTRAVENOUS

## 2021-10-08 MED ORDER — PROPOFOL 10 MG/ML IV BOLUS
INTRAVENOUS | Status: DC | PRN
  Administered 2021-10-08: 70 mg via INTRAVENOUS

## 2021-10-08 MED ORDER — SODIUM CHLORIDE 0.9 % IV SOLN: INTRAVENOUS | Status: DC

## 2021-10-08 NOTE — Anesthesia Postprocedure Evaluation (Signed)
Anesthesia Post Note  Patient: Kyle Avila  Procedure(s) Performed: CARDIOVERSION     Patient location during evaluation: PACU Anesthesia Type: General Level of consciousness: awake and alert Pain management: pain level controlled Vital Signs Assessment: post-procedure vital signs reviewed and stable Respiratory status: spontaneous breathing, nonlabored ventilation and respiratory function stable Cardiovascular status: stable and blood pressure returned to baseline Anesthetic complications: no   No notable events documented.  Last Vitals:  Vitals:   10/08/21 0859 10/08/21 0903  BP: (!) 100/48 (!) 114/54  Pulse: (!) 59 (!) 59  Resp: 15 18  Temp:    SpO2: 98% 96%    Last Pain:  Vitals:   10/08/21 0903  TempSrc:   PainSc: 0-No pain                 Audry Pili

## 2021-10-08 NOTE — Transfer of Care (Signed)
Immediate Anesthesia Transfer of Care Note  Patient: Kyle Avila  Procedure(s) Performed: CARDIOVERSION  Patient Location: PACU and Endoscopy Unit  Anesthesia Type:General  Level of Consciousness: drowsy  Airway & Oxygen Therapy: Patient Spontanous Breathing  Post-op Assessment: Report given to RN and Post -op Vital signs reviewed and stable  Post vital signs: Reviewed and stable  Last Vitals:  Vitals Value Taken Time  BP    Temp    Pulse    Resp    SpO2      Last Pain:  Vitals:   10/08/21 0815  PainSc: 0-No pain         Complications: No notable events documented.

## 2021-10-08 NOTE — Anesthesia Preprocedure Evaluation (Addendum)
Anesthesia Evaluation  Patient identified by MRN, date of birth, ID band Patient awake    Reviewed: Allergy & Precautions, NPO status , Patient's Chart, lab work & pertinent test results, reviewed documented beta blocker date and time   History of Anesthesia Complications Negative for: history of anesthetic complications  Airway Mallampati: II  TM Distance: >3 FB Neck ROM: Full    Dental  (+) Dental Advisory Given   Pulmonary neg pulmonary ROS,    Pulmonary exam normal        Cardiovascular hypertension, Pt. on home beta blockers and Pt. on medications + CAD, + Past MI, + Cardiac Stents, + CABG and + Peripheral Vascular Disease  + dysrhythmias Atrial Fibrillation and Ventricular Tachycardia + pacemaker + Valvular Problems/Murmurs MR  Rhythm:Irregular Rate:Normal   '23 TTE -EF 45%. Global hypokinesis. There is mild asymmetric left ventricular hypertrophy of the septal segment. Right ventricular systolic function is mildly reduced. Left atrial size was moderately dilated. Right atrial size was mildly dilated. Moderate mitral valve regurgitation. Aortic valve regurgitation is trivial.    Neuro/Psych negative neurological ROS  negative psych ROS   GI/Hepatic Neg liver ROS, GERD  ,  Endo/Other  Hypothyroidism   Renal/GU CRFRenal disease     Musculoskeletal  (+) Arthritis ,   Abdominal   Peds  Hematology  On eliquis and plavix     Anesthesia Other Findings Lupus   Reproductive/Obstetrics                            Anesthesia Physical Anesthesia Plan  ASA: 3  Anesthesia Plan: General   Post-op Pain Management:    Induction: Intravenous  PONV Risk Score and Plan: 2 and Treatment may vary due to age or medical condition and Propofol infusion  Airway Management Planned: Natural Airway and Mask  Additional Equipment: None  Intra-op Plan:   Post-operative Plan:   Informed Consent:  I have reviewed the patients History and Physical, chart, labs and discussed the procedure including the risks, benefits and alternatives for the proposed anesthesia with the patient or authorized representative who has indicated his/her understanding and acceptance.       Plan Discussed with: CRNA and Anesthesiologist  Anesthesia Plan Comments:        Anesthesia Quick Evaluation

## 2021-10-08 NOTE — Discharge Instructions (Signed)

## 2021-10-08 NOTE — Interval H&P Note (Signed)
History and Physical Interval Note:  10/08/2021 8:36 AM  [name]  has presented today for surgery, with the diagnosis of afib.  The various methods of treatment have been discussed with the patient and family. After consideration of risks, benefits and other options for treatment, the patient has consented to  Procedure(s): CARDIOVERSION (N/A) as a surgical intervention.  [name] history has been reviewed, patient examined, no change in status, stable for surgery.  I have reviewed the patient's chart and labs.  Questions were answered to the patient's satisfaction.     Kyle Avila

## 2021-10-08 NOTE — CV Procedure (Signed)
Procedure: Electrical Cardioversion Indications:  Atrial Fibrillation  Procedure Details:  Consent: Risks of procedure as well as the alternatives and risks of each were explained to the (patient/caregiver).  Consent for procedure obtained.  Time Out: Verified patient identification, verified procedure, site/side was marked, verified correct patient position, special equipment/implants available, medications/allergies/relevent history reviewed, required imaging and test results available. PERFORMED.  Patient placed on cardiac monitor, pulse oximetry, supplemental oxygen as necessary.  Sedation given:  Propofol '70mg'$ ; lidocaine '60mg'$  Pacer pads placed anterior and posterior chest.  Cardioverted 1 time(s).  Cardioversion with synchronized biphasic 150J shock.  Evaluation: Findings: Post procedure EKG shows:  A-V paced and a-sensed, v-paced  with PVCs  Complications: None Patient did tolerate procedure well.  Time Spent Directly with the Patient:  64mnutes   HFreada Bergeron6/13/2023, 8:48 AM

## 2021-10-09 ENCOUNTER — Encounter: Payer: Self-pay | Admitting: Cardiovascular Disease

## 2021-10-09 ENCOUNTER — Encounter (HOSPITAL_COMMUNITY): Payer: Self-pay | Admitting: Cardiology

## 2021-10-09 MED ORDER — FLUTICASONE PROPIONATE 50 MCG/ACT NA SUSP: 2.0000 | Freq: Every day | NASAL | 3 refills | Status: DC | PRN

## 2021-10-09 MED ORDER — AZELASTINE HCL 0.1 % NA SOLN: 2.0000 | Freq: Two times a day (BID) | NASAL | 3 refills | Status: DC

## 2021-10-09 MED ORDER — PREDNISONE 20 MG PO TABS: 40.0000 mg | ORAL_TABLET | Freq: Every day | ORAL | 0 refills | Status: AC

## 2021-10-09 NOTE — Addendum Note (Signed)
Addended by: Rosana Berger on: 10/09/2021 02:56 PM   Modules accepted: Orders

## 2021-10-09 NOTE — Addendum Note (Signed)
Addended byLarey Days on: 10/09/2021 05:10 PM   Modules accepted: Orders

## 2021-10-09 NOTE — Telephone Encounter (Signed)
Dr Silas Flood, please advise on pred rx. I went ahead and refilled his NS. Thanks!   Well I made it through my Cardioversion today. Two items I need your help with. 1) Would you please send in a prescription to [facility] for my two nasal sprays. 2) Would you please send a prescription for prednisone to [facility] on [address]  Thank you  Ninfa Linden

## 2021-10-09 NOTE — Telephone Encounter (Signed)
Well I made it through my Cardioversion today. Two items I need your help with. 1) Would you please send in a prescription to [facility] for my two nasal sprays. 2) Would you please send a prescription for prednisone to [facility] on [address]  Thank you  Ninfa Linden

## 2021-10-10 ENCOUNTER — Encounter: Payer: Self-pay | Admitting: Cardiovascular Disease

## 2021-10-10 DIAGNOSIS — I4819 Other persistent atrial fibrillation: Secondary | ICD-10-CM

## 2021-10-10 MED ORDER — AMIODARONE HCL 200 MG PO TABS: 200.0000 mg | ORAL_TABLET | Freq: Every day | ORAL | 3 refills | Status: DC

## 2021-10-14 ENCOUNTER — Encounter: Payer: Self-pay | Admitting: Cardiovascular Disease

## 2021-10-14 NOTE — Telephone Encounter (Signed)
Fine for him to start exercise. thanks

## 2021-10-16 ENCOUNTER — Ambulatory Visit (INDEPENDENT_AMBULATORY_CARE_PROVIDER_SITE_OTHER): Payer: Medicare HMO | Admitting: Podiatry

## 2021-10-16 DIAGNOSIS — M79674 Pain in right toe(s): Secondary | ICD-10-CM

## 2021-10-16 DIAGNOSIS — B351 Tinea unguium: Secondary | ICD-10-CM | POA: Diagnosis not present

## 2021-10-16 DIAGNOSIS — M79675 Pain in left toe(s): Secondary | ICD-10-CM | POA: Diagnosis not present

## 2021-10-16 NOTE — Progress Notes (Signed)
  Subjective:  Patient ID: Kyle Avila, male    DOB: February 23, 1946,  MRN: [id number]  Chief Complaint  Patient presents with   Nail Problem      NAIL FUNGUS TX / NAIL REMOVAL    76 y.o. male returns for follow-up with the above complaint. History confirmed with patient.  Right hallux nail has not grown back much.  He uses ciclopirox for some time but has not been consistent with that and has not noticed much change in the nails Objective:  Physical Exam: warm, good capillary refill, no trophic changes or ulcerative lesions, normal DP and PT pulses, and normal sensory exam.  Mycotic nails 1 through 5  Right Foot: Right hallux nail minimal regrowth only in the corner, onychomycosis of the remaining nails.    Assessment:   1. Pain due to onychomycosis of toenails of both feet       Plan:  Patient was evaluated and treated and all questions answered.  At this point do not think topical medications will be effective.  He has contraindications to using oral therapy for this.  He is able to take care of his nails some at home and will follow-up with me as needed with further debridements are necessary.  Today the nails were debrided in length and thickness using a sharp nail nipper to a comfortable level.  He tolerated this well  Return if symptoms worsen or fail to improve.

## 2021-10-21 ENCOUNTER — Ambulatory Visit (HOSPITAL_COMMUNITY)
Admission: RE | Admit: 2021-10-21 | Discharge: 2021-10-21 | Disposition: A | Discharge status: Home / Self Care | Payer: Medicare HMO | Source: Ambulatory Visit | Attending: Physician Assistant | Admitting: Physician Assistant

## 2021-10-21 ENCOUNTER — Encounter: Payer: Self-pay | Admitting: Cardiovascular Disease

## 2021-10-21 ENCOUNTER — Encounter (HOSPITAL_COMMUNITY): Payer: Self-pay | Admitting: Physician Assistant

## 2021-10-21 VITALS — BP 128/60 | HR 60 | Ht 68.0 in | Wt 164.4 lb

## 2021-10-21 DIAGNOSIS — D6869 Other thrombophilia: Secondary | ICD-10-CM

## 2021-10-21 DIAGNOSIS — E785 Hyperlipidemia, unspecified: Secondary | ICD-10-CM | POA: Insufficient documentation

## 2021-10-21 DIAGNOSIS — R0683 Snoring: Secondary | ICD-10-CM | POA: Diagnosis not present

## 2021-10-21 DIAGNOSIS — I130 Hypertensive heart and chronic kidney disease with heart failure and stage 1 through stage 4 chronic kidney disease, or unspecified chronic kidney disease: Secondary | ICD-10-CM | POA: Insufficient documentation

## 2021-10-21 DIAGNOSIS — Z7901 Long term (current) use of anticoagulants: Secondary | ICD-10-CM | POA: Diagnosis not present

## 2021-10-21 DIAGNOSIS — N189 Chronic kidney disease, unspecified: Secondary | ICD-10-CM | POA: Diagnosis not present

## 2021-10-21 DIAGNOSIS — I4819 Other persistent atrial fibrillation: Secondary | ICD-10-CM | POA: Diagnosis not present

## 2021-10-21 DIAGNOSIS — Z950 Presence of cardiac pacemaker: Secondary | ICD-10-CM | POA: Insufficient documentation

## 2021-10-21 DIAGNOSIS — Z7722 Contact with and (suspected) exposure to environmental tobacco smoke (acute) (chronic): Secondary | ICD-10-CM | POA: Diagnosis not present

## 2021-10-21 DIAGNOSIS — I442 Atrioventricular block, complete: Secondary | ICD-10-CM | POA: Diagnosis not present

## 2021-10-21 DIAGNOSIS — I255 Ischemic cardiomyopathy: Secondary | ICD-10-CM | POA: Diagnosis not present

## 2021-10-21 DIAGNOSIS — Z951 Presence of aortocoronary bypass graft: Secondary | ICD-10-CM | POA: Diagnosis not present

## 2021-10-21 DIAGNOSIS — Z955 Presence of coronary angioplasty implant and graft: Secondary | ICD-10-CM | POA: Diagnosis not present

## 2021-10-21 DIAGNOSIS — I779 Disorder of arteries and arterioles, unspecified: Secondary | ICD-10-CM | POA: Insufficient documentation

## 2021-10-21 DIAGNOSIS — I2510 Atherosclerotic heart disease of native coronary artery without angina pectoris: Secondary | ICD-10-CM | POA: Diagnosis not present

## 2021-10-21 DIAGNOSIS — H353211 Exudative age-related macular degeneration, right eye, with active choroidal neovascularization: Secondary | ICD-10-CM | POA: Diagnosis not present

## 2021-10-21 DIAGNOSIS — I5042 Chronic combined systolic (congestive) and diastolic (congestive) heart failure: Secondary | ICD-10-CM | POA: Diagnosis not present

## 2021-10-21 MED ORDER — AMIODARONE HCL 200 MG PO TABS: 200.0000 mg | ORAL_TABLET | Freq: Every day | ORAL | 0 refills | Status: DC

## 2021-10-25 ENCOUNTER — Encounter (HOSPITAL_COMMUNITY): Payer: Medicare HMO

## 2021-11-07 ENCOUNTER — Encounter: Payer: Self-pay | Admitting: Cardiovascular Disease

## 2021-11-11 ENCOUNTER — Encounter: Payer: Self-pay | Admitting: Cardiovascular Disease

## 2021-11-11 ENCOUNTER — Ambulatory Visit: Payer: Medicare HMO | Admitting: Cardiovascular Disease

## 2021-11-11 VITALS — BP 120/70 | HR 60 | Ht 68.0 in | Wt 165.6 lb

## 2021-11-11 DIAGNOSIS — I4819 Other persistent atrial fibrillation: Secondary | ICD-10-CM

## 2021-11-11 DIAGNOSIS — E782 Mixed hyperlipidemia: Secondary | ICD-10-CM | POA: Diagnosis not present

## 2021-11-11 DIAGNOSIS — N184 Chronic kidney disease, stage 4 (severe): Secondary | ICD-10-CM

## 2021-11-11 DIAGNOSIS — I10 Essential (primary) hypertension: Secondary | ICD-10-CM | POA: Diagnosis not present

## 2021-11-11 DIAGNOSIS — I25119 Atherosclerotic heart disease of native coronary artery with unspecified angina pectoris: Secondary | ICD-10-CM

## 2021-11-11 MED ORDER — ISOSORBIDE MONONITRATE ER 120 MG PO TB24: ORAL_TABLET | ORAL | 3 refills | Status: AC

## 2021-11-11 MED ORDER — FUROSEMIDE 80 MG PO TABS: ORAL_TABLET | ORAL | 3 refills | Status: DC

## 2021-11-11 NOTE — Patient Instructions (Signed)
Medication Instructions:  Your physician recommends that you continue on your current medications as directed. Please refer to the Current Medication list given to you today.  *If you need a refill on your cardiac medications before your next appointment, please call your pharmacy*   Lab Work: CMET, TSH, CBC, LIPIDS (in December) If you have labs (blood work) drawn today and your tests are completely normal, you will receive your results only by: [place] (if you have MyChart) OR A paper copy in the mail If you have any lab test that is abnormal or we need to change your treatment, we will call you to review the results.   Testing/Procedures: NONE   Follow-Up: At [facility], you and your health needs are our priority.  As part of our continuing mission to provide you with exceptional heart care, we have created designated Provider Care Teams.  These Care Teams include your primary Cardiologist (physician) and Advanced Practice Providers (APPs -  Physician Assistants and Nurse Practitioners) who all work together to provide you with the care you need, when you need it.  We recommend signing up for the patient portal called "MyChart".  Sign up information is provided on this After Visit Summary.  MyChart is used to connect with patients for Virtual Visits (Telemedicine).  Patients are able to view lab/test results, encounter notes, upcoming appointments, etc.  Non-urgent messages can be sent to your provider as well.   To learn more about what you can do with MyChart, go to [email or web address].    Your next appointment:   4 month(s)  The format for your next appointment:   In Person  Provider:   [name], MD      Important Information About Sugar

## 2021-11-11 NOTE — Progress Notes (Signed)
Cardiology Office Note:    Date:  11/11/2021   ID:  Kyle Avila, DOB [birth year], MRN [id number]  PCP:  [name], [place] [facility] Cardiologist:  [name], MD Electrophysiologist:  [name], MD     Referring MD: [name], MD   Chief Complaint  Patient presents with   Coronary Artery Disease    History of Present Illness:    Kyle Avila is a 76 y.o. 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  He is doing fairly well with the exception of lack of energy.  He has not had any angina since his PCI procedure in April.  He remains on multidrug antianginal therapy.  He has not been as active as he was in the past.  He is not sure that he is can be able to go skiing out [place] this year.  He has mild exertional dyspnea but no orthopnea, PND, or leg edema.  No heart palpitations, lightheadedness, or syncope.  Patient is compliant with his medications.  Denies any recent bleeding problems.  Past Medical History:  Diagnosis Date   Arthritis    "some in my back" (04/14/2017)   CAD (coronary artery disease)    a.  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; lupus attacked them" (04/14/2017)   Mitral insufficiency    Moderate mitral regurgitation 10/02/2021   Echocardiogram 6/23: EF 9, global HK, mild ASH, mildly reduced RVSF, normal PASP, RVSP 31, mod LAD, mild RAE, mod MR, trivial AI, AV sclerosis w/o stenosis   Myocardial infarction ([place])    "I've had some mild one" (04/14/2017)   Presence of permanent cardiac pacemaker    Skin cancer    "nose"   VT (ventricular tachycardia)-nonsustained 05/20/2011    Past Surgical History:  Procedure Laterality Date   ACHILLES TENDON REPAIR Right    CARDIAC CATHETERIZATION     CARDIAC CATHETERIZATION N/A 11/03/2014   Procedure: Left Heart Cath and Cors/Grafts Angiography;  Surgeon: [name], MD;  Location: [facility];  Service: Cardiovascular;  Laterality: N/A;   CARDIAC CATHETERIZATION N/A 11/03/2014   Procedure: Coronary Stent Intervention;  Surgeon: [name], MD;  Location: [facility];  Service: Cardiovascular;  Laterality: N/A;   CARDIOVERSION N/A 07/17/2021   Procedure: CARDIOVERSION;  Surgeon: [name], MD;  Location: [facility];  Service: Cardiovascular;  Laterality: N/A;   CARDIOVERSION N/A 10/08/2021   Procedure: CARDIOVERSION;  Surgeon: Freada Bergeron,  MD;  Location: [facility];  Service: Cardiovascular;  Laterality: N/A;   CAROTID ENDARTERECTOMY  2002   "? side"   CATARACT EXTRACTION W/ INTRAOCULAR LENS IMPLANT     "? side"   CORONARY ANGIOPLASTY WITH STENT PLACEMENT  04/14/2017   CORONARY ANGIOPLASTY WITH STENT PLACEMENT  11/03/2014   SVG PVA   CORONARY ARTERY BYPASS GRAFT     with redo cabg   CORONARY BALLOON ANGIOPLASTY N/A 08/15/2021   Procedure: CORONARY BALLOON ANGIOPLASTY;  Surgeon: Burnell Blanks, MD;  Location: [facility];  Service: Cardiovascular;  Laterality: N/A;   CORONARY STENT INTERVENTION N/A 10/18/2019   Procedure: CORONARY STENT  INTERVENTION;  Surgeon: [name], MD;  Location: [facility];  Service: Cardiovascular;  Laterality: N/A;   CORONARY/GRAFT ANGIOGRAPHY N/A 10/18/2019   Procedure: CORONARY/GRAFT ANGIOGRAPHY;  Surgeon: [name], MD;  Location: [facility];  Service: Cardiovascular;  Laterality: N/A;   EP IMPLANTABLE DEVICE N/A 09/13/2014   Procedure: PPM Generator Changeout;  Surgeon: Deboraha Sprang, MD;  Location: [facility];  Service: Cardiovascular;  Laterality: N/A;   LEFT HEART CATH AND CORS/GRAFTS ANGIOGRAPHY N/A 04/14/2017   Procedure: LEFT HEART CATH AND CORS/GRAFTS ANGIOGRAPHY;  Surgeon: [name], MD;  Location: [facility];  Service: Cardiovascular;  Laterality: N/A;   LEFT HEART CATH AND CORS/GRAFTS ANGIOGRAPHY N/A 08/15/2021   Procedure: LEFT HEART CATH AND CORS/GRAFTS ANGIOGRAPHY;  Surgeon: Burnell Blanks, MD;  Location: [facility];  Service: Cardiovascular;  Laterality: N/A;   MOHS SURGERY     "just outside my nose"   PACEMAKER INSERTION  2007   TONSILLECTOMY  1958    Current Medications: Current Meds  Medication Sig   acetaminophen (TYLENOL) 500 MG tablet Take 1,000 mg by mouth every 6 (six) hours as needed for moderate pain.   allopurinol (ZYLOPRIM) 100 MG tablet Take 100 mg by mouth at bedtime.   amiodarone (PACERONE) 200 MG tablet Take 1 tablet (200 mg total) by mouth daily.   amLODipine (NORVASC) 2.5 MG tablet Take 3 tablets (7.5 mg total) by mouth daily.   apixaban (ELIQUIS) 5 MG TABS tablet Take 1 tablet (5 mg total) by mouth 2 (two) times daily.   azelastine (ASTELIN) 0.1 % nasal spray Place 2 sprays into both nostrils 2 (two) times daily. Use in each nostril as directed   Bevacizumab (AVASTIN IV) Inject into the vein. Eye injections- once monthly in the right eye   clopidogrel (PLAVIX) 75 MG tablet Take 75 mg by mouth at bedtime.   fluticasone (FLONASE) 50 MCG/ACT nasal spray Place 2 sprays into both nostrils daily as  needed for allergies or rhinitis.   ketoconazole (NIZORAL) 2 % cream Apply 1 application. topically daily as needed (itching toes).   losartan (COZAAR) 50 MG tablet Take 1 tablet (50 mg total) by mouth daily.   Mepolizumab (NUCALA) 100 MG/ML SOAJ Inject 1 mL (100 mg total) into the skin every 28 (twenty-eight) days.   metoprolol succinate (TOPROL-XL) 50 MG 24 hr tablet TAKE 1 TABLET TWO TIMES DAILY. TAKE WITH OR IMMEDIATELY FOLLOWING MEALS.   nitroGLYCERIN (NITROSTAT) 0.4 MG SL tablet PLACE 1 TABLET UNDER THE TONGUE EVERY 5 MINUTES AS NEEDED FOR CHEST PAIN   ranolazine (RANEXA) 1000 MG SR tablet Taking 1/2 tablet by mouth twice daily   REPATHA SURECLICK 865 MG/ML SOAJ Inject 140 mg into the skin every 14 (fourteen) days.   tamsulosin (FLOMAX) 0.4 MG CAPS Take 0.4 mg by  mouth daily.   [DISCONTINUED] furosemide (LASIX) 80 MG tablet Take 1 tablet by mouth each morning and 1/2 tablet daily in the afternoon until fluid is resolved, then decrease to 1 tablet daily thereafter   [DISCONTINUED] isosorbide mononitrate (IMDUR) 120 MG 24 hr tablet TAKE 1 TABLET (120 MG TOTAL) BY MOUTH DAILY.     Allergies:   Patient has no known allergies.   Social History   Socioeconomic History   Marital status: Married    Spouse name: Not on file   Number of children: Not on file   Years of education: Not on file   Highest education level: Not on file  Occupational History   Not on file  Tobacco Use   Smoking status: Never    Passive exposure: Yes   Smokeless tobacco: Never   Tobacco comments:    Never smoke 10/21/21  Vaping Use   Vaping Use: Never used  Substance and Sexual Activity   Alcohol use: Yes    Alcohol/week: 3.0 standard drinks of alcohol    Types: 1 Glasses of wine, 1 Cans of beer, 1 Standard drinks or equivalent per week    Comment: 2-3 drinks per week 10/21/21   Drug use: No   Sexual activity: Not Currently  Other Topics Concern   Not on file  Social History Narrative   Not on file    Social Determinants of Health   Financial Resource Strain: Not on file  Food Insecurity: Not on file  Transportation Needs: Not on file  Physical Activity: Insufficiently Active (06/25/2017)   Exercise Vital Sign    Days of Exercise per Week: 4 days    Minutes of Exercise per Session: 30 min  Stress: No Stress Concern Present (06/25/2017)   [place]    Feeling of Stress : Not at all  Social Connections: Not on file     Family History: [name] family history includes Cancer in his mother; Heart attack in his father; Heart disease in his father.  ROS:   Please see the history of present illness.    All other systems reviewed and are negative.  EKGs/Labs/Other Studies Reviewed:    The following studies were reviewed today: Echo June 2023:  1. Left ventricular ejection fraction, by estimation, is 45%. The left  ventricle has mildly decreased function. The left ventricle demonstrates  global hypokinesis. There is mild asymmetric left ventricular hypertrophy  of the septal segment. Left  ventricular diastolic parameters are indeterminate.   2. Right ventricular systolic function is mildly reduced. The right  ventricular size is normal. There is normal pulmonary artery systolic  pressure. The estimated right ventricular systolic pressure is 89.3 mmHg.   3. Left atrial size was moderately dilated.   4. Right atrial size was mildly dilated.   5. The mitral valve is normal in structure. Moderate mitral valve  regurgitation. No evidence of mitral stenosis. Systolic blunting of  pulmonary vein flow.   6. The aortic valve is tricuspid. There is moderate calcification of the  aortic valve. There is moderate thickening of the aortic valve. Aortic  valve regurgitation is trivial. Aortic valve sclerosis is present, with no  evidence of aortic valve stenosis.   7. The inferior vena cava is dilated in size with <50%  respiratory  variability, suggesting right atrial pressure of 15 mmHg.   Comparison(s): Compared to 2021 study, mitral regurgitation has increased.  Cannot access 05/18/21 study.   Cardiac Cath 08/15/2021:  Severe triple vessel CAD s/p CABG with 2/3 patent bypass grafts Chronic occlusion of the ostial LAD. The mid and distal LAD fills from the patent LIMA graft. Stable moderate stenosis at the anastomosis of the LIMA gaft to the LAD Chronic occlusion of the intermediate branch that fills from left to left collaterals supplied by the LIMA to LAD.  Patent proximal and mid Circumflex stents. Known occlusion of SVG to OM (non injected) Chronic occlusion ostial RCA (non injected) Patent SVG to PDA. Severe stenosis in the stented segment in the proximal SVG and severe stenosis in the stented segment in the distal SVG.  Successful PTCA with scoring balloon angioplasty of both stented segments in the SVG to PDA Normal LVEDP   Recommendations: Continue Plavix. I will have him take ASA in the am but this can be stopped once his Eliquis is resumed.   EKG:  EKG is ordered today.  The ekg ordered today demonstrates AV sequential pacing 60 bpm, PVCs noted  Recent Labs: 09/26/2021: BUN 60; Creatinine, Ser 2.80; Hemoglobin 12.6; Platelets 159; Potassium 4.8; Sodium 140  Recent Lipid Panel    Component Value Date/Time   CHOL 133 10/28/2019 0820   TRIG 254 (H) 10/28/2019 0820   HDL 41 10/28/2019 0820   CHOLHDL 3.2 10/28/2019 0820   CHOLHDL 2.4 10/19/2019 0306   VLDL 55 (H) 10/19/2019 0306   LDLCALC 52 10/28/2019 0820   LDLDIRECT 47 10/28/2019 0820     Risk Assessment/Calculations:    CHA2DS2-VASc Score = 5   This indicates a 7.2% annual risk of stroke. [name] score is based upon: CHF History: 1 HTN History: 1 Diabetes History: 0 Stroke History: 0 Vascular Disease History: 1 Age Score: 2 Gender Score: 0          Physical Exam:    VS:  BP 120/70   Pulse 60   Ht '5\' 8"'$  (1.727 m)    Wt 165 lb 9.6 oz (75.1 kg)   SpO2 95%   BMI 25.18 kg/m     Wt Readings from Last 3 Encounters:  11/11/21 165 lb 9.6 oz (75.1 kg)  10/21/21 164 lb 6.4 oz (74.6 kg)  10/08/21 163 lb (73.9 kg)     GEN:  Well nourished, well developed in no acute distress HEENT: Normal NECK: No JVD; No carotid bruits LYMPHATICS: No lymphadenopathy CARDIAC: RRR, 2/6 systolic murmur at the right upper sternal border, no apical murmur noted RESPIRATORY:  Clear to auscultation without rales, wheezing or rhonchi  ABDOMEN: Soft, non-tender, non-distended MUSCULOSKELETAL:  No edema; No deformity  SKIN: Warm and dry NEUROLOGIC:  Alert and oriented x 3 PSYCHIATRIC:  Normal affect   ASSESSMENT:    1. Persistent atrial fibrillation ([place])   2. Coronary artery disease involving native coronary artery of native heart with angina pectoris ([place])   3. Primary hypertension   4. CKD (chronic kidney disease) stage 4, GFR 15-29 ml/min (HCC)   5. Mixed hyperlipidemia    PLAN:    In order of problems listed above:  Maintaining sinus rhythm on amiodarone.  We will continue his same regimen.  Also treated with metoprolol succinate.  Anticoagulated with apixaban.  Check amiodarone monitoring labs in 6 months with a metabolic panel to include LFTs as well as a TSH. his angina has resolved.  He is having no symptoms at present on therapy with amlodipine isosorbide ranolazine and metoprolol succinate.  Continue clopidogrel after most recent PCI procedure. Blood pressure is well controlled on current therapy Followed regularly  by Dr. Marval Regal.  Most recent creatinine is 2.8 mg/dL. Treated with Repatha.  LDL cholesterol 55 mg/dL.  Overall the patient appears clinically stable.  I will plan to repeat an echocardiogram next summer for surveillance.  His current medications will be continued.  Fortunately, he is having no further angina following his most recent PCI procedure.  He will have a 12-monthfollow-up office visit  after his amiodarone monitoring labs are drawn in December.  Medication Adjustments/Labs and Tests Ordered: Current medicines are reviewed at length with the patient today.  Concerns regarding medicines are outlined above.  Orders Placed This Encounter  Procedures   Comprehensive metabolic panel   CBC   TSH   Lipid panel   EKG 12-Lead   Meds ordered this encounter  Medications   furosemide (LASIX) 80 MG tablet    Sig: Take 1 tablet by mouth each morning and 1/2 tablet daily in the afternoon until fluid is resolved, then decrease to 1 tablet daily thereafter    Dispense:  90 tablet    Refill:  3   isosorbide mononitrate (IMDUR) 120 MG 24 hr tablet    Sig: TAKE 1 TABLET (120 MG TOTAL) BY MOUTH DAILY.    Dispense:  90 tablet    Refill:  3    Patient Instructions  Medication Instructions:  Your physician recommends that you continue on your current medications as directed. Please refer to the Current Medication list given to you today.  *If you need a refill on your cardiac medications before your next appointment, please call your pharmacy*   Lab Work: CMET, TSH, CBC, LIPIDS (in December) If you have labs (blood work) drawn today and your tests are completely normal, you will receive your results only by: [place](if you have MyChart) OR A paper copy in the mail If you have any lab test that is abnormal or we need to change your treatment, we will call you to review the results.   Testing/Procedures: NONE   Follow-Up: At [facility] you and your health needs are our priority.  As part of our continuing mission to provide you with exceptional heart care, we have created designated Provider Care Teams.  These Care Teams include your primary Cardiologist (physician) and Advanced Practice Providers (APPs -  Physician Assistants and Nurse Practitioners) who all work together to provide you with the care you need, when you need it.  We recommend signing up for the  patient portal called "MyChart".  Sign up information is provided on this After Visit Summary.  MyChart is used to connect with patients for Virtual Visits (Telemedicine).  Patients are able to view lab/test results, encounter notes, upcoming appointments, etc.  Non-urgent messages can be sent to your provider as well.   To learn more about what you can do with MyChart, go to [email or web address]    Your next appointment:   4 month(s)  The format for your next appointment:   In Person  Provider:   MSherren Mocha MD      Important Information About Sugar         Signed, MSherren Mocha MD  11/11/2021 12:25 PM    [place]

## 2021-11-19 ENCOUNTER — Encounter: Payer: Self-pay | Admitting: Pulmonary Disease

## 2021-11-25 DIAGNOSIS — H353211 Exudative age-related macular degeneration, right eye, with active choroidal neovascularization: Secondary | ICD-10-CM | POA: Diagnosis not present

## 2021-11-26 ENCOUNTER — Encounter: Payer: Self-pay | Admitting: Pulmonary Disease

## 2021-11-26 ENCOUNTER — Encounter: Payer: Self-pay | Admitting: Cardiovascular Disease

## 2021-11-26 NOTE — Telephone Encounter (Signed)
Called and spoke with the pt  Advised needs ov  He was already scheduled for appt tomorrow 11/27/21  Can eval cough at the time of visit  Nothing further needed

## 2021-11-26 NOTE — Telephone Encounter (Signed)
Please advise 

## 2021-11-27 ENCOUNTER — Ambulatory Visit (INDEPENDENT_AMBULATORY_CARE_PROVIDER_SITE_OTHER): Payer: Medicare HMO | Admitting: Pulmonary Disease

## 2021-11-27 ENCOUNTER — Encounter: Payer: Self-pay | Admitting: Pulmonary Disease

## 2021-11-27 VITALS — BP 116/62 | HR 60 | Temp 98.8°F | Ht 68.0 in | Wt 165.4 lb

## 2021-11-27 DIAGNOSIS — R053 Chronic cough: Secondary | ICD-10-CM

## 2021-11-27 MED ORDER — PREDNISONE 10 MG PO TABS: ORAL_TABLET | ORAL | 0 refills | Status: DC

## 2021-11-27 NOTE — Progress Notes (Signed)
Patient ID: Kyle Avila, male    DOB: June 27, 1945, 76 y.o.   MRN: [id number]  Chief Complaint  Patient presents with   Acute Visit    Pt is here for acute visit. Pt states that his cough is worse. Pt states that cough is the same during the day and at night. Cough is productive. Pt is on Nucala shots. Pt has not taken anything OTC. Pt states prednisone is the only thing that helps.     Referring provider: [name], MD  HPI:   Mr. Sherrow is a 76 y.o. with CAD s/p CABG x 2 (1988 and 1996) and multiple PCIs, chronic exertional angina who we are seeing in follow up of chronic cough.  Most recent cardiology note reviewed  Routine follow-up.  In interim since last visit started Nucala.  Unfortunate, does not improve the cough.  Prednisone has been effective.  Got another course in the interim.  Cough then comes back a couple weeks after finishing prednisone course.  Nucala has not really changed anything.  Does not feel it is beneficial.  Producing mucus.  Some nasal congestion, postnasal drip, rhinorrhea.  Reviewed ENT note 04/2021 where endoscopic exam was normal.  Discussed at length with patient concern for ongoing chronic cough with mucus production related to either sinus issues versus cough variant asthma.  Recommend CT sinuses to evaluate for signs of sinusitis for more aggressive ENT evaluation.  If normal we may need to investigate a different biologic medicine.  We will stop Nucala.  Long prednisone taper today.  Hold at 5 mg until further decisions on additional medications were made.  HPI at initial visit: Cough present for ~4 years. Cough is severe. First noticed in fall and spring but now present year round. No clear exacerbating factors. Not worse in morning or at night or when supine. Constant throughout the day. Not worse when eating. Occasionally worse when eating dry crackers. Cough improves for weeks at a time after prednisone, usually 10 mg daily x 7 days. Most recent  course was ~3 weeks ago.  Trialed flonase for post nasal drip and does not think it improved. Denies heartburn or regurgitation of food. Trialed once a day PPI for "months" without improved cough. Notes when first started his sputum was foul smelling after sneezing. This resolved months to years ago. Endorses occasional sinus pressure that comes and goes. Unsure if cough is worse with pressure present or if prednisone improves sinus pressure. Denies post nasal drip or watery eyes. Does not think cough varies with seasons. No h/o asthma as child or younger adult. Denies DOE but notes significant issues with angina.   Had CXR per PCP for this cough but notes its been at least a couple of years. I can not acces this image. CXR reviewed 12/2005 with new R sided pacemaker placement, lungs clear, no evidence of hyperinflation on my interpretation.   PMH:  Smoker/ Smoking History: never smoker  Social History: Never smoker. Married. Retired from [employer].  Surgical history: CABG x 2, multiple PCIs  Family history: Father with heart attack in 51s, mother with cancer  PFT: 11/2019 reviewed and interpreted as normal spirometry, no bronchodilator response, lung volumes within normal limits, corrected DLCO within normal limits-normal PFTs  Lab Results: Personally reviewed, abs eos 300  CBC    Component Value Date/Time   WBC 3.7 (L) 09/26/2021 0916   RBC 3.60 (L) 09/26/2021 0916   HGB 12.6 (L) 09/26/2021 0916   HGB 13.1 08/13/2021  0858   HCT 39.2 09/26/2021 0916   HCT 38.7 08/13/2021 0858   PLT 159 09/26/2021 0916   PLT 154 08/13/2021 0858   MCV 108.9 (H) 09/26/2021 0916   MCV 105 (H) 08/13/2021 0858   MCH 35.0 (H) 09/26/2021 0916   MCHC 32.1 09/26/2021 0916   RDW 13.2 09/26/2021 0916   RDW 13.0 08/13/2021 0858   LYMPHSABS 0.7 05/02/2021 1537   LYMPHSABS 1.1 10/14/2019 0900   MONOABS 0.2 05/02/2021 1537   EOSABS 0.0 05/02/2021 1537   EOSABS 0.3 10/14/2019 0900   BASOSABS 0.0 05/02/2021  1537   BASOSABS 0.0 10/14/2019 0900    BMET    Component Value Date/Time   NA 140 09/26/2021 0916   NA 143 08/13/2021 0858   K 4.8 09/26/2021 0916   CL 110 09/26/2021 0916   CO2 24 09/26/2021 0916   GLUCOSE 107 (H) 09/26/2021 0916   BUN 60 (H) 09/26/2021 0916   BUN 59 (H) 08/13/2021 0858   CREATININE 2.80 (H) 09/26/2021 0916   CALCIUM 9.1 09/26/2021 0916   GFRNONAA 23 (L) 09/26/2021 0916   GFRAA 26 (L) 11/15/2019 1548    BNP No results found for: "BNP"  ProBNP No results found for: "PROBNP"   Imaging: Personally reviewed and as interpretation in HPI above.  Specialty Problems       Pulmonary Problems   Chronic pansinusitis   Chronic cough    No Known Allergies  Immunization History  Administered Date(s) Administered   DTaP, 5 pertussis antigens 08/29/2016   Influenza Split 01/27/2011, 01/27/2012, 01/10/2013, 07/27/2013   Influenza, High Dose Seasonal PF 01/31/2019   Influenza, Quadrivalent, Recombinant, Inj, Pf 02/04/2019, 02/04/2020   Influenza-Unspecified 01/10/2021   PFIZER(Purple Top)SARS-COV-2 Vaccination 05/11/2019, 06/02/2019, 06/27/2019, 02/27/2020, 08/05/2020   Pneumococcal Conjugate-13 08/09/2014   Pneumococcal Polysaccharide-23 02/27/2007   Zoster, Live 07/27/2013    Past Medical History:  Diagnosis Date   Arthritis    "some in my back" (04/14/2017)   CAD (coronary artery disease)    a.  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; lupus attacked them" (04/14/2017)   Mitral insufficiency    Moderate mitral regurgitation 10/02/2021   Echocardiogram 6/23: EF 61, global HK, mild ASH, mildly reduced RVSF, normal PASP, RVSP 31, mod LAD, mild RAE, mod MR, trivial AI, AV sclerosis w/o stenosis    Myocardial infarction ([place])    "I've had some mild one" (04/14/2017)   Presence of permanent cardiac pacemaker    Skin cancer    "nose"   VT (ventricular tachycardia)-nonsustained 05/20/2011    Tobacco History: Social History   Tobacco Use  Smoking Status Never   Passive exposure: Yes  Smokeless Tobacco Never  Tobacco Comments   Never smoke 10/21/21   Counseling given: Not Answered Tobacco comments: Never smoke 10/21/21   Continue to not smoke  Outpatient Encounter Medications as of 11/27/2021  Medication Sig   acetaminophen (TYLENOL) 500 MG tablet Take 1,000 mg by mouth every 6 (six) hours as needed for moderate pain.   allopurinol (ZYLOPRIM) 100 MG tablet Take 100 mg by mouth at bedtime.   amiodarone (PACERONE) 200 MG tablet Take 1 tablet (  200 mg total) by mouth daily.   amLODipine (NORVASC) 2.5 MG tablet Take 3 tablets (7.5 mg total) by mouth daily.   apixaban (ELIQUIS) 5 MG TABS tablet Take 1 tablet (5 mg total) by mouth 2 (two) times daily.   azelastine (ASTELIN) 0.1 % nasal spray Place 2 sprays into both nostrils 2 (two) times daily. Use in each nostril as directed   Bevacizumab (AVASTIN IV) Inject into the vein. Eye injections- once monthly in the right eye   clopidogrel (PLAVIX) 75 MG tablet Take 75 mg by mouth at bedtime.   fluticasone (FLONASE) 50 MCG/ACT nasal spray Place 2 sprays into both nostrils daily as needed for allergies or rhinitis.   furosemide (LASIX) 80 MG tablet Take 1 tablet by mouth each morning and 1/2 tablet daily in the afternoon until fluid is resolved, then decrease to 1 tablet daily thereafter   isosorbide mononitrate (IMDUR) 120 MG 24 hr tablet TAKE 1 TABLET (120 MG TOTAL) BY MOUTH DAILY.   ketoconazole (NIZORAL) 2 % cream Apply 1 application. topically daily as needed (itching toes).   losartan (COZAAR) 50 MG tablet Take 1 tablet (50 mg total) by mouth daily.   Mepolizumab (NUCALA) 100 MG/ML SOAJ Inject 1 mL (100 mg total) into the skin every 28  (twenty-eight) days.   metoprolol succinate (TOPROL-XL) 50 MG 24 hr tablet TAKE 1 TABLET TWO TIMES DAILY. TAKE WITH OR IMMEDIATELY FOLLOWING MEALS.   nitroGLYCERIN (NITROSTAT) 0.4 MG SL tablet PLACE 1 TABLET UNDER THE TONGUE EVERY 5 MINUTES AS NEEDED FOR CHEST PAIN   predniSONE (DELTASONE) 10 MG tablet Take 4 tablets (40 mg total) by mouth daily with breakfast for 7 days, THEN 3 tablets (30 mg total) daily with breakfast for 7 days, THEN 2 tablets (20 mg total) daily with breakfast for 7 days, THEN 1 tablet (10 mg total) daily with breakfast for 7 days, THEN 0.5 tablets (5 mg total) daily with breakfast.   ranolazine (RANEXA) 1000 MG SR tablet Taking 1/2 tablet by mouth twice daily   REPATHA SURECLICK 353 MG/ML SOAJ Inject 140 mg into the skin every 14 (fourteen) days.   tamsulosin (FLOMAX) 0.4 MG CAPS Take 0.4 mg by mouth daily.   No facility-administered encounter medications on file as of 11/27/2021.     Review of Systems  Review of Systems  N/a  Physical Exam  BP 116/62 (BP Location: Left Arm, Patient Position: Sitting, Cuff Size: Normal)   Pulse 60   Temp 98.8 F (37.1 C) (Oral)   Ht '5\' 8"'$  (1.727 m)   Wt 165 lb 6.4 oz (75 kg)   SpO2 95%   BMI 25.15 kg/m   Wt Readings from Last 5 Encounters:  11/27/21 165 lb 6.4 oz (75 kg)  11/11/21 165 lb 9.6 oz (75.1 kg)  10/21/21 164 lb 6.4 oz (74.6 kg)  10/08/21 163 lb (73.9 kg)  09/26/21 167 lb 3.2 oz (75.8 kg)    BMI Readings from Last 5 Encounters:  11/27/21 25.15 kg/m  11/11/21 25.18 kg/m  10/21/21 25.00 kg/m  10/08/21 24.78 kg/m  09/26/21 25.42 kg/m     Physical Exam General: well appearing, no acute distress Eyes: EOMI, no icterus Respiratory: NWOB, lungs CTAB, no wheeze Cardiovascular: RRR, no murmurs, no gallops Skin: Warm, no rash, no lesions Neurologic: normal gait, CN intact Psych: Normal mood, full affect   Assessment & Plan:   Mr. Pinedo is a 76 y.o. with CAD s/p CABG x 2 (1988 and 1996) and multiple  PCIs, chronic  exertional angina who presents with chief comlpaint of chronic cough.   Differential for chronic cough includes silent reflux, chronic sinusitis and post nasal drip, cough variant asthma, and eosinophilic bronchitis. He is a never smoker.  He has trialed PPI without improvement and denies other GERD symptoms making GERD less likely. He improves with prednisone. High suspicion for cough variant asthma or eosinophilic bronchitis. Notably abs eos 300 on CBC 09/2019.  However, no response, no improvement in cough with mid dose ICS/LABA therapy via [place].  Chest x-ray clear.  PFTs within normal limits.  Nucala for 4 months without improvement.  Discussed at length with patient concern for ongoing chronic cough with mucus production related to either sinus issues versus cough variant asthma.  Recommend CT sinuses to evaluate for signs of sinusitis for more aggressive ENT evaluation.  If normal we may need to investigate a different biologic medicine.  We will stop Nucala at this time.  Long prednisone taper today.  Hold at 5 mg until further decisions on additional medications were made.   Return in about 2 months (around 01/27/2022).   Lanier Clam, MD 11/27/2021   This appointment required 41 minutes of patient care (this includes precharting, chart review, review of results, face-to-face care, etc.).

## 2021-11-27 NOTE — Patient Instructions (Addendum)
Nice to see you again  I prescribed a prolonged taper of prednisone.  40 mg a day for 7 days, 30 mg day for 7 days, 20 mg for 7 days, 10 mg for 7 days, and then 5 mg thereafter (will be 1/2 tablet)  I am ordering a CT scan of your sinuses to make sure is not inflammation or signs that the mucus is being produced from there.  Assuming the CT scan of your sinuses will be clear, we will need to discuss starting a different biologic or injectable medicine in the future.  Stop Nucala at this time.   Return to clinic in 2 months or sooner as needed with Dr. Silas Flood

## 2021-12-02 ENCOUNTER — Encounter: Payer: Self-pay | Admitting: Pulmonary Disease

## 2021-12-02 DIAGNOSIS — R053 Chronic cough: Secondary | ICD-10-CM

## 2021-12-03 NOTE — Telephone Encounter (Signed)
Will forward to PCCs to reschedule pt's CT.

## 2021-12-05 NOTE — Telephone Encounter (Signed)
I'm a little confused by message - states he can't go 4/22 but afternoon of 24th would be fine.  He is now scheduled for 8/24 in the afternoon.  Upon further research looks like pt was scheduled for 8/22 and he rescheduled this himself to 8/24??  I have called & left him a vm to call me to clarify and see if he needs [place] to do anything.  Will route back to triage to make pt aware thru MyChart & can be closed.

## 2021-12-07 ENCOUNTER — Other Ambulatory Visit: Payer: Self-pay | Admitting: Cardiovascular Disease

## 2021-12-07 DIAGNOSIS — I4819 Other persistent atrial fibrillation: Secondary | ICD-10-CM

## 2021-12-12 ENCOUNTER — Encounter: Payer: Self-pay | Admitting: Pulmonary Disease

## 2021-12-12 DIAGNOSIS — Z125 Encounter for screening for malignant neoplasm of prostate: Secondary | ICD-10-CM | POA: Diagnosis not present

## 2021-12-12 DIAGNOSIS — Z0000 Encounter for general adult medical examination without abnormal findings: Secondary | ICD-10-CM | POA: Diagnosis not present

## 2021-12-12 DIAGNOSIS — E039 Hypothyroidism, unspecified: Secondary | ICD-10-CM | POA: Diagnosis not present

## 2021-12-12 DIAGNOSIS — M109 Gout, unspecified: Secondary | ICD-10-CM | POA: Diagnosis not present

## 2021-12-12 DIAGNOSIS — R82998 Other abnormal findings in urine: Secondary | ICD-10-CM | POA: Diagnosis not present

## 2021-12-12 DIAGNOSIS — R7989 Other specified abnormal findings of blood chemistry: Secondary | ICD-10-CM | POA: Diagnosis not present

## 2021-12-12 DIAGNOSIS — E785 Hyperlipidemia, unspecified: Secondary | ICD-10-CM | POA: Diagnosis not present

## 2021-12-13 NOTE — Telephone Encounter (Signed)
PCC's please advise if we can reschedule pt's CT scan to a non hospital setting. Pt states it is almost $100 cheaper. Thanks.

## 2021-12-17 ENCOUNTER — Other Ambulatory Visit (HOSPITAL_COMMUNITY): Payer: Medicare HMO

## 2021-12-18 ENCOUNTER — Ambulatory Visit (INDEPENDENT_AMBULATORY_CARE_PROVIDER_SITE_OTHER): Payer: Medicare HMO

## 2021-12-18 DIAGNOSIS — I442 Atrioventricular block, complete: Secondary | ICD-10-CM | POA: Diagnosis not present

## 2021-12-19 ENCOUNTER — Other Ambulatory Visit (HOSPITAL_COMMUNITY): Payer: Medicare HMO

## 2021-12-19 DIAGNOSIS — Z23 Encounter for immunization: Secondary | ICD-10-CM | POA: Diagnosis not present

## 2021-12-19 DIAGNOSIS — I6521 Occlusion and stenosis of right carotid artery: Secondary | ICD-10-CM | POA: Diagnosis not present

## 2021-12-19 DIAGNOSIS — R413 Other amnesia: Secondary | ICD-10-CM | POA: Diagnosis not present

## 2021-12-19 DIAGNOSIS — D692 Other nonthrombocytopenic purpura: Secondary | ICD-10-CM | POA: Diagnosis not present

## 2021-12-19 DIAGNOSIS — R972 Elevated prostate specific antigen [PSA]: Secondary | ICD-10-CM | POA: Diagnosis not present

## 2021-12-19 DIAGNOSIS — I25118 Atherosclerotic heart disease of native coronary artery with other forms of angina pectoris: Secondary | ICD-10-CM | POA: Diagnosis not present

## 2021-12-19 DIAGNOSIS — I480 Paroxysmal atrial fibrillation: Secondary | ICD-10-CM | POA: Diagnosis not present

## 2021-12-19 DIAGNOSIS — I5023 Acute on chronic systolic (congestive) heart failure: Secondary | ICD-10-CM | POA: Diagnosis not present

## 2021-12-19 DIAGNOSIS — Z0000 Encounter for general adult medical examination without abnormal findings: Secondary | ICD-10-CM | POA: Diagnosis not present

## 2021-12-19 DIAGNOSIS — E039 Hypothyroidism, unspecified: Secondary | ICD-10-CM | POA: Diagnosis not present

## 2021-12-19 LAB — CUP PACEART REMOTE DEVICE CHECK
Battery Impedance: 955 Ohm
Battery Remaining Longevity: 55 mo
Battery Voltage: 2.77 V
Brady Statistic AP VP Percent: 96 %
Brady Statistic AP VS Percent: 3 %
Brady Statistic AS VP Percent: 1 %
Brady Statistic AS VS Percent: 0 %
Date Time Interrogation Session: 20230823103459
Implantable Lead Implant Date: 20070831
Implantable Lead Implant Date: 20070831
Implantable Lead Location: 753859
Implantable Lead Location: 753860
Implantable Lead Model: 4469
Implantable Lead Model: 4470
Implantable Lead Serial Number: 485949
Implantable Lead Serial Number: 553665
Implantable Pulse Generator Implant Date: 20160518
Lead Channel Impedance Value: 456 Ohm
Lead Channel Impedance Value: 472 Ohm
Lead Channel Pacing Threshold Amplitude: 0.625 V
Lead Channel Pacing Threshold Amplitude: 0.625 V
Lead Channel Pacing Threshold Pulse Width: 0.4 ms
Lead Channel Pacing Threshold Pulse Width: 0.4 ms
Lead Channel Setting Pacing Amplitude: 2 V
Lead Channel Setting Pacing Amplitude: 2.5 V
Lead Channel Setting Pacing Pulse Width: 0.4 ms
Lead Channel Setting Sensing Sensitivity: 4 mV

## 2021-12-19 NOTE — Telephone Encounter (Signed)
Kyle Avila Has been rescheduled to [place]. He is aware of the new location nothing further needed. Routing back to triage for them to close the [employer]

## 2021-12-23 ENCOUNTER — Encounter: Payer: Self-pay | Admitting: Internal Medicine

## 2021-12-30 ENCOUNTER — Encounter: Payer: Self-pay | Admitting: Cardiovascular Disease

## 2021-12-30 ENCOUNTER — Encounter: Payer: Self-pay | Admitting: Pulmonary Disease

## 2021-12-30 DIAGNOSIS — R0609 Other forms of dyspnea: Secondary | ICD-10-CM

## 2021-12-30 DIAGNOSIS — J9611 Chronic respiratory failure with hypoxia: Secondary | ICD-10-CM

## 2021-12-31 NOTE — Telephone Encounter (Signed)
Forwarding to [facility] since this is regarding location of ct request

## 2022-01-01 ENCOUNTER — Encounter: Payer: Self-pay | Admitting: Pulmonary Disease

## 2022-01-01 DIAGNOSIS — H353211 Exudative age-related macular degeneration, right eye, with active choroidal neovascularization: Secondary | ICD-10-CM | POA: Diagnosis not present

## 2022-01-02 ENCOUNTER — Encounter: Payer: Self-pay | Admitting: Pulmonary Disease

## 2022-01-02 DIAGNOSIS — R809 Proteinuria, unspecified: Secondary | ICD-10-CM | POA: Diagnosis not present

## 2022-01-02 DIAGNOSIS — E875 Hyperkalemia: Secondary | ICD-10-CM | POA: Diagnosis not present

## 2022-01-02 DIAGNOSIS — R0902 Hypoxemia: Secondary | ICD-10-CM | POA: Diagnosis not present

## 2022-01-02 DIAGNOSIS — N184 Chronic kidney disease, stage 4 (severe): Secondary | ICD-10-CM | POA: Diagnosis not present

## 2022-01-02 DIAGNOSIS — N2581 Secondary hyperparathyroidism of renal origin: Secondary | ICD-10-CM | POA: Diagnosis not present

## 2022-01-02 DIAGNOSIS — I129 Hypertensive chronic kidney disease with stage 1 through stage 4 chronic kidney disease, or unspecified chronic kidney disease: Secondary | ICD-10-CM | POA: Diagnosis not present

## 2022-01-02 NOTE — Telephone Encounter (Signed)
PCCs, pts CT scans were changed to a sooner date (only by a few days). Would this affect the pre-cert/auth? Thanks.

## 2022-01-03 ENCOUNTER — Encounter: Payer: Self-pay | Admitting: Pulmonary Disease

## 2022-01-03 NOTE — Telephone Encounter (Signed)
Pt is already scheduled for 9/12 at [facility].  I called him to verify this is ok - not in [facility].  He said that is fine and he is happy with the appt.  Nothing further needed.  Will route to triage to close the message.

## 2022-01-06 ENCOUNTER — Ambulatory Visit (INDEPENDENT_AMBULATORY_CARE_PROVIDER_SITE_OTHER): Payer: Medicare HMO | Admitting: Pulmonary Disease

## 2022-01-06 DIAGNOSIS — R0609 Other forms of dyspnea: Secondary | ICD-10-CM

## 2022-01-06 LAB — PULMONARY FUNCTION TEST
DL/VA % pred: 70 %
DL/VA: 2.81 ml/min/mmHg/L
DLCO cor % pred: 60 %
DLCO cor: 13.65 ml/min/mmHg
DLCO unc % pred: 60 %
DLCO unc: 13.65 ml/min/mmHg
FEF 25-75 Post: 3.71 L/sec
FEF 25-75 Pre: 2.73 L/sec
FEF2575-%Change-Post: 35 %
FEF2575-%Pred-Post: 200 %
FEF2575-%Pred-Pre: 147 %
FEV1-%Change-Post: 4 %
FEV1-%Pred-Post: 103 %
FEV1-%Pred-Pre: 98 %
FEV1-Post: 2.7 L
FEV1-Pre: 2.58 L
FEV1FVC-%Change-Post: 5 %
FEV1FVC-%Pred-Pre: 114 %
FEV6-%Change-Post: 0 %
FEV6-%Pred-Post: 91 %
FEV6-%Pred-Pre: 91 %
FEV6-Post: 3.12 L
FEV6-Pre: 3.11 L
FEV6FVC-%Change-Post: 0 %
FEV6FVC-%Pred-Post: 107 %
FEV6FVC-%Pred-Pre: 107 %
FVC-%Change-Post: 0 %
FVC-%Pred-Post: 85 %
FVC-%Pred-Pre: 85 %
FVC-Post: 3.12 L
FVC-Pre: 3.12 L
Post FEV1/FVC ratio: 87 %
Post FEV6/FVC ratio: 100 %
Pre FEV1/FVC ratio: 82 %
Pre FEV6/FVC Ratio: 100 %
RV % pred: 104 %
RV: 2.53 L
TLC % pred: 86 %
TLC: 5.6 L

## 2022-01-06 NOTE — Progress Notes (Signed)
PFT done today. 

## 2022-01-06 NOTE — Telephone Encounter (Signed)
Patient is scheduled for a PFT today at 3pm. Patient would like to know if there is any other tests he needs to complete as soon as possible. Please contact patient as soon as possible. Call back number is [phone].

## 2022-01-06 NOTE — Telephone Encounter (Signed)
Authorizations for both CT's cover dos 01/07/22.  Will route to triage to make pt aware thru MyChart & can close out message.

## 2022-01-07 ENCOUNTER — Ambulatory Visit
Admission: RE | Admit: 2022-01-07 | Discharge: 2022-01-07 | Disposition: A | Discharge status: Home / Self Care | Payer: Medicare HMO | Source: Ambulatory Visit | Attending: Pulmonary Disease | Admitting: Pulmonary Disease

## 2022-01-07 ENCOUNTER — Other Ambulatory Visit: Payer: Medicare HMO

## 2022-01-07 DIAGNOSIS — I700 Atherosclerosis of aorta: Secondary | ICD-10-CM | POA: Diagnosis not present

## 2022-01-07 DIAGNOSIS — J9611 Chronic respiratory failure with hypoxia: Secondary | ICD-10-CM

## 2022-01-07 DIAGNOSIS — J929 Pleural plaque without asbestos: Secondary | ICD-10-CM | POA: Diagnosis not present

## 2022-01-07 DIAGNOSIS — J329 Chronic sinusitis, unspecified: Secondary | ICD-10-CM | POA: Diagnosis not present

## 2022-01-07 DIAGNOSIS — J90 Pleural effusion, not elsewhere classified: Secondary | ICD-10-CM | POA: Diagnosis not present

## 2022-01-07 DIAGNOSIS — R053 Chronic cough: Secondary | ICD-10-CM

## 2022-01-07 DIAGNOSIS — J84112 Idiopathic pulmonary fibrosis: Secondary | ICD-10-CM | POA: Diagnosis not present

## 2022-01-08 ENCOUNTER — Encounter: Payer: Self-pay | Admitting: Cardiovascular Disease

## 2022-01-09 ENCOUNTER — Encounter: Payer: Self-pay | Admitting: Internal Medicine

## 2022-01-09 ENCOUNTER — Encounter: Payer: Self-pay | Admitting: Cardiovascular Disease

## 2022-01-09 ENCOUNTER — Telehealth: Payer: Self-pay

## 2022-01-09 ENCOUNTER — Encounter: Payer: Self-pay | Admitting: Pulmonary Disease

## 2022-01-09 ENCOUNTER — Telehealth: Payer: Self-pay | Admitting: Pulmonary Disease

## 2022-01-09 ENCOUNTER — Ambulatory Visit: Payer: Medicare HMO | Attending: Internal Medicine | Admitting: Internal Medicine

## 2022-01-09 VITALS — BP 118/66 | HR 60 | Ht 66.0 in | Wt 163.2 lb

## 2022-01-09 DIAGNOSIS — J984 Other disorders of lung: Secondary | ICD-10-CM | POA: Diagnosis not present

## 2022-01-09 DIAGNOSIS — T462X5A Adverse effect of other antidysrhythmic drugs, initial encounter: Secondary | ICD-10-CM | POA: Diagnosis not present

## 2022-01-09 DIAGNOSIS — I5022 Chronic systolic (congestive) heart failure: Secondary | ICD-10-CM

## 2022-01-09 DIAGNOSIS — E785 Hyperlipidemia, unspecified: Secondary | ICD-10-CM

## 2022-01-09 DIAGNOSIS — I25119 Atherosclerotic heart disease of native coronary artery with unspecified angina pectoris: Secondary | ICD-10-CM

## 2022-01-09 DIAGNOSIS — I10 Essential (primary) hypertension: Secondary | ICD-10-CM

## 2022-01-09 DIAGNOSIS — I4819 Other persistent atrial fibrillation: Secondary | ICD-10-CM | POA: Diagnosis not present

## 2022-01-09 MED ORDER — PREDNISONE 20 MG PO TABS: 40.0000 mg | ORAL_TABLET | Freq: Every day | ORAL | 2 refills | Status: DC

## 2022-01-09 NOTE — Patient Instructions (Signed)
Medication Instructions:  Your physician has recommended you make the following change in your medication:  1) START taking prednisone 40 mg daily  2) STOP taking amiodarone  *If you need a refill on your cardiac medications before your next appointment, please call your pharmacy*   Follow-Up: At [facility], you and your health needs are our priority.  As part of our continuing mission to provide you with exceptional heart care, we have created designated Provider Care Teams.  These Care Teams include your primary Cardiologist (physician) and Advanced Practice Providers (APPs -  Physician Assistants and Nurse Practitioners) who all work together to provide you with the care you need, when you need it.  Follow up as scheduled.   Important Information About Sugar

## 2022-01-09 NOTE — Telephone Encounter (Signed)
Pt currently seeing DOD Ali Lowe and knows to stop Amiodarone.

## 2022-01-09 NOTE — Telephone Encounter (Signed)
-----   Message from [name], MD sent at 01/09/2022  2:35 PM EDT ----- Regarding: RE: Amio tox I agree thanks Matt. Judson Roch can you reach out to him and confirm that he is stopping amiodarone? ----- Message ----- From: Lanier Clam, MD Sent: 01/09/2022   6:00 AM EDT To: [name], MD Subject: Amio tox                                       His DLCO is decreased and has diffuse nodular GGOs. Fortunately, TLC is stable. I think amio toxicity is a real possibility. Pulmonary edema can look similar but not classically. If ok with you, I would be ok stopping amio and with new hypoxemia starting steroid therapy.

## 2022-01-09 NOTE — Telephone Encounter (Signed)
Called patient and left detailed message per DPR to confirm that he is no longer taking Amiodarone. Asked for him to call or send MyChart message to let us know.

## 2022-01-09 NOTE — Progress Notes (Signed)
Cardiology Office Note:    Date:  01/09/2022   ID:  Kyle Avila, DOB Dec 30, 1945, MRN [id number]  PCP:  [name], MD   [facility] Providers Cardiologist:  Lenna Sciara, MD Referring MD: [name], MD   Chief Complaint/Reason for Referral: Shortness of breath  ASSESSMENT:    1. Amiodarone pulmonary toxicity   2. Persistent atrial fibrillation ([place])   3. Coronary artery disease involving native coronary artery of native heart with angina pectoris ([place])   4. Primary hypertension   5. Chronic systolic heart failure ([place])   6. Hyperlipidemia LDL goal <70     PLAN:    In order of problems listed above: 1.  Amiodarone pulmonary toxicity: Given the patient's desaturations and appearance on CT scan this likely represents amiodarone toxicity.  He has stopped his amiodarone and we will start prednisone 40 mg daily (based on my literature review).  The patient will be following up with pulmonary next week.  I told him to contact our office if he develops worsening cough or fevers as this might alert [place] to stop the steroids and evaluate for alternative diagnoses.  He is euvolemic on exam so I do do not think he has developed pulmonary edema. 2.  Coronary artery disease: 3.  Hypertension: Blood pressures well controlled on his current regimen 4.  Chronic systolic heart failure: Last EF was mildly abnormal. 5.  Persistent atrial fibrillation: We will stop amiodarone for now.  If he reverts into atrial fibrillation he may need alternative therapy.  He tells me that he is quite symptomatic with atrial fibrillation in the form of ongoing weakness.. 6.  Hyperlipidemia: Continue Repatha   Dispo: As previously scheduled     Medication Adjustments/Labs and Tests Ordered: Current medicines are reviewed at length with the patient today.  Concerns regarding medicines are outlined above.  The following changes have been made:     Labs/tests ordered: Orders Placed This  Encounter  Procedures   EKG 12-Lead    Medication Changes: Meds ordered this encounter  Medications   predniSONE (DELTASONE) 20 MG tablet    Sig: Take 2 tablets (40 mg total) by mouth daily with breakfast.    Dispense:  90 tablet    Refill:  2     Current medicines are reviewed at length with the patient today.  The patient does not have concerns regarding medicines.   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 male with the indicated medical history here for for an urgent visit.  I am seeing him as the DOD.  The patient is normally cared for by Drs. Burt Knack and [place].  On review the patient's chart the patient the patient was started on amiodarone over the summer due to recurrent atrial fibrillation.  He was seen in atrial fibrillation clinic  in early June and was cardioverted shortly thereafter to an AV paced rhythm.  He was seen by Dr. Burt Knack in July and was doing well.  He developed increasing shortness of breath and was seen by pulmonology in early August.  He was referred for high-resolution CT scan which was suspicious for amiodarone toxicity.  He is here to discuss those results.  Of note he stopped his amiodarone recently.  The patient tells me that he has been very weak and short of breath for about a week.  He denies any chest  pain or palpitations.  He denies any presyncope or syncope.  He has had no signs or symptoms of heart failure.  He denies any fevers or chills or worsening productive cough over his chronic cough.  He has been recording his oxygen saturations at home and they are in the high 80s.  Today it is 85%.        Current Medications: Current Meds  Medication Sig   acetaminophen (TYLENOL) 500 MG tablet Take 1,000 mg by mouth every 6 (six) hours as needed for moderate pain.   allopurinol (ZYLOPRIM) 100 MG tablet Take 100 mg by mouth at bedtime.   amLODipine (NORVASC) 2.5 MG tablet Take 3 tablets (7.5 mg total) by mouth daily.   apixaban (ELIQUIS) 5 MG TABS tablet Take 1 tablet (5 mg total) by mouth 2 (two) times daily.   azelastine (ASTELIN) 0.1 % nasal spray Place 2 sprays into both nostrils 2 (two) times daily. Use in each nostril as directed   clopidogrel (PLAVIX) 75 MG tablet Take 75 mg by mouth at bedtime.   fluticasone (FLONASE) 50 MCG/ACT nasal spray Place 2 sprays into both nostrils daily as needed for allergies or rhinitis.   furosemide (LASIX) 80 MG tablet Take 1 tablet by mouth each morning and 1/2 tablet daily in the afternoon until fluid is resolved, then decrease to 1 tablet daily thereafter   isosorbide mononitrate (IMDUR) 120 MG 24 hr tablet TAKE 1 TABLET (120 MG TOTAL) BY MOUTH DAILY.   ketoconazole (NIZORAL) 2 % cream Apply 1 application. topically daily as needed (itching toes).   losartan (COZAAR) 50 MG tablet Take 1 tablet (50 mg total) by mouth daily.   metoprolol succinate (TOPROL-XL) 50 MG 24 hr tablet TAKE 1 TABLET TWO TIMES DAILY. TAKE WITH OR IMMEDIATELY FOLLOWING MEALS.   nitroGLYCERIN (NITROSTAT) 0.4 MG SL tablet PLACE 1 TABLET UNDER THE TONGUE EVERY 5 MINUTES AS NEEDED FOR CHEST PAIN   predniSONE (DELTASONE) 20 MG tablet Take 2 tablets (40 mg total) by mouth daily with breakfast.   ranolazine (RANEXA) 1000 MG SR tablet Taking 1/2 tablet by mouth twice daily   REPATHA  SURECLICK 283 MG/ML SOAJ Inject 140 mg into the skin every 14 (fourteen) days.   tamsulosin (FLOMAX) 0.4 MG CAPS Take 0.4 mg by mouth daily.   [DISCONTINUED] Bevacizumab (AVASTIN IV) Inject into the vein. Eye injections- once monthly in the right eye   [DISCONTINUED] Mepolizumab (NUCALA) 100 MG/ML SOAJ Inject 1 mL (100 mg total) into the skin every 28 (twenty-eight) days.   [DISCONTINUED] predniSONE (DELTASONE) 10 MG tablet Take 4 tablets (40 mg total) by mouth daily with breakfast for 7 days, THEN 3 tablets (30 mg total) daily with breakfast for 7 days, THEN 2 tablets (20 mg total) daily with breakfast for 7 days, THEN 1 tablet (10 mg total) daily with breakfast for 7 days, THEN 0.5 tablets (5 mg total) daily with breakfast.     Allergies:  Amiodarone   Social History:   Social History   Tobacco Use   Smoking status: Never    Passive exposure: Yes   Smokeless tobacco: Never   Tobacco comments:    Never smoke 10/21/21  Vaping Use   Vaping Use: Never used  Substance Use Topics   Alcohol use: Yes    Alcohol/week: 3.0 standard drinks of alcohol    Types: 1 Glasses of wine, 1 Cans of beer, 1 Standard drinks or equivalent per week    Comment: 2-3 drinks per week 10/21/21   Drug use: No     Family Hx: Family History  Problem Relation Age of Onset   Heart attack Father        65s   Heart disease Father        Heart Disease before age 63   Cancer Mother      Review of Systems:   Please see the history of present illness.    All other systems reviewed and are negative.     EKGs/Labs/Other Test Reviewed:    EKG:  EKG performed July 2023 that I personally reviewed demonstrates AV paced with PVCs; EKG performed today that I personally reviewed demonstrates AV pacing  Prior CV studies:  Echo June 2023:  1. Left ventricular ejection fraction, by estimation, is 45%. The left  ventricle has mildly decreased function. The left ventricle demonstrates  global hypokinesis. There  is mild asymmetric left ventricular hypertrophy  of the septal segment. Left  ventricular diastolic parameters are indeterminate.   2. Right ventricular systolic function is mildly reduced. The right  ventricular size is normal. There is normal pulmonary artery systolic  pressure. The estimated right ventricular systolic pressure is 49.7 mmHg.   3. Left atrial size was moderately dilated.   4. Right atrial size was mildly dilated.   5. The mitral valve is normal in structure. Moderate mitral valve  regurgitation. No evidence of mitral stenosis. Systolic blunting of  pulmonary vein flow.   6. The aortic valve is tricuspid. There is moderate calcification of the  aortic valve. There is moderate thickening of the aortic valve. Aortic  valve regurgitation is trivial. Aortic valve sclerosis is present, with no  evidence of aortic valve stenosis.   7. The inferior vena cava is dilated in size with <50% respiratory  variability, suggesting right atrial pressure of 15 mmHg.   Comparison(s): Compared to 2021 study, mitral regurgitation has increased.  Cannot access 05/18/21 study.    Cardiac Cath 08/15/2021: Severe triple vessel CAD s/p CABG with 2/3 patent bypass grafts Chronic occlusion of the ostial LAD. The mid and distal LAD fills from the patent LIMA graft. Stable moderate stenosis at the anastomosis of the LIMA gaft to the LAD Chronic occlusion of the intermediate branch that fills from left to left collaterals supplied by the LIMA to LAD.  Patent proximal and mid Circumflex stents. Known occlusion of SVG to OM (non injected) Chronic occlusion ostial RCA (non injected) Patent SVG to PDA. Severe stenosis in the stented segment in the proximal SVG and severe stenosis in the stented segment in the distal SVG.  Successful PTCA with scoring balloon angioplasty of both stented segments in the SVG to PDA Normal LVEDP   Recommendations: Continue Plavix. I will have him take ASA in the am but this  can be stopped once his Eliquis is resumed.  Other studies Reviewed: Review of the additional studies/records demonstrates:   High-resolution chest CT September 2023: 1. Diffuse pulmonary parenchymal  ground-glass with mild septal thickening, findings which can be seen in the setting of amiodarone toxicity. Findings are suggestive of an alternative diagnosis (not UIP) per consensus guidelines: Diagnosis of Idiopathic Pulmonary Fibrosis: An Official ATS/ERS/JRS/ALAT Clinical Practice Guideline. [place], Iss 5, [phone], Dec 27 2016. 2. Small left pleural effusion. Difficult to exclude mild congestive heart failure. 3.  Aortic atherosclerosis (ICD10-I70.0).  Recent Labs: 09/26/2021: BUN 60; Creatinine, Ser 2.80; Hemoglobin 12.6; Platelets 159; Potassium 4.8; Sodium 140   Recent Lipid Panel Lab Results  Component Value Date/Time   CHOL 133 10/28/2019 08:20 AM   TRIG 254 (H) 10/28/2019 08:20 AM   HDL 41 10/28/2019 08:20 AM   LDLCALC 52 10/28/2019 08:20 AM   LDLDIRECT 47 10/28/2019 08:20 AM    Risk Assessment/Calculations:     CHA2DS2-VASc Score = 5   This indicates a 7.2% annual risk of stroke. [name] score is based upon: CHF History: 1 HTN History: 1 Diabetes History: 0 Stroke History: 0 Vascular Disease History: 1 Age Score: 2 Gender Score: 0              Physical Exam:    VS:  BP 118/66   Pulse 60   Ht '5\' 6"'$  (1.676 m)   Wt 163 lb 3.2 oz (74 kg)   SpO2 99%   BMI 26.34 kg/m    Wt Readings from Last 3 Encounters:  01/09/22 163 lb 3.2 oz (74 kg)  11/27/21 165 lb 6.4 oz (75 kg)  11/11/21 165 lb 9.6 oz (75.1 kg)    GENERAL:  No apparent distress, AOx3 HEENT:  No carotid bruits, +2 carotid impulses, no scleral icterus CAR: RRR no murmurs, gallops, rubs, or thrills RES: Decreased breath sounds at right base ABD:  Soft, nontender, nondistended, positive bowel sounds x 4 VASC:  +2 radial pulses, +2 carotid pulses, palpable pedal  pulses NEURO:  CN 2-12 grossly intact; motor and sensory grossly intact PSYCH:  No active depression or anxiety EXT:  No edema, ecchymosis, or cyanosis  Signed, Early Osmond, MD  01/09/2022 5:01 PM    [address], [place], [place]  [address] Phone: [phone]; Fax: [phone]   Note:  This document was prepared using Dragon voice recognition software and may include unintentional dictation errors.

## 2022-01-09 NOTE — Telephone Encounter (Signed)
I agree with this. I have followed him closely and I don't have the sense he's felt dramatically different in afib or sinus rhythm. Might be reasonable to drop amiodarone and avoid other AAD Rx. He is paced in sinus and in afib. thx

## 2022-01-13 ENCOUNTER — Encounter: Payer: Self-pay | Admitting: Cardiovascular Disease

## 2022-01-13 ENCOUNTER — Encounter: Payer: Self-pay | Admitting: Pulmonary Disease

## 2022-01-15 NOTE — Progress Notes (Signed)
Remote pacemaker transmission.   

## 2022-01-16 ENCOUNTER — Other Ambulatory Visit (HOSPITAL_BASED_OUTPATIENT_CLINIC_OR_DEPARTMENT_OTHER): Payer: Medicare HMO

## 2022-01-16 ENCOUNTER — Encounter: Payer: Self-pay | Admitting: Pulmonary Disease

## 2022-01-16 ENCOUNTER — Ambulatory Visit (INDEPENDENT_AMBULATORY_CARE_PROVIDER_SITE_OTHER): Payer: Medicare HMO | Admitting: Pulmonary Disease

## 2022-01-16 VITALS — BP 122/62 | HR 55 | Ht 66.0 in | Wt 169.8 lb

## 2022-01-16 DIAGNOSIS — J984 Other disorders of lung: Secondary | ICD-10-CM | POA: Diagnosis not present

## 2022-01-16 DIAGNOSIS — R053 Chronic cough: Secondary | ICD-10-CM

## 2022-01-16 DIAGNOSIS — J324 Chronic pansinusitis: Secondary | ICD-10-CM

## 2022-01-16 MED ORDER — SULFAMETHOXAZOLE-TRIMETHOPRIM 400-80 MG PO TABS: 1.0000 | ORAL_TABLET | ORAL | 0 refills | Status: DC

## 2022-01-16 NOTE — Patient Instructions (Signed)
Nice to see you  I am glad you are starting to improve  Take prednisone 40 mg daily (2 tablets) for a total of 1 month  On October 15, decrease to 30 mg daily (1 and 1/2 tablet) for 1 month  On November 15, decrease to 20 mg daily, we will plan for for 1 month but we may decide to do a little bit shorter and plan subsequent decrease in dose over time when we meet in follow-up.  While you are on doses of prednisone greater than 20 mg, I recommend taking Bactrim 1 tablet every Monday Wednesday Friday, 3 times weekly to prevent a type of fungal infection and pneumonia in the lung caused by pneumocystis.  Follow-up with me in 2 months or sooner as needed

## 2022-01-17 ENCOUNTER — Encounter: Payer: Self-pay | Admitting: Pulmonary Disease

## 2022-01-17 DIAGNOSIS — J984 Other disorders of lung: Secondary | ICD-10-CM

## 2022-01-17 DIAGNOSIS — R053 Chronic cough: Secondary | ICD-10-CM

## 2022-01-17 NOTE — Progress Notes (Signed)
Patient ID: Kyle Avila, male    DOB: 09/18/1945, 76 y.o.   MRN: [id number]  Chief Complaint  Patient presents with   Follow-up    Pt is here for follow up for DOE. Pt still presents with his cough. Pt states that he does still get short of breath at times. He states that he has to take breaks in between activities. Walked pt in office today and his oxygen stayed up     Referring provider: [name], MD  HPI:   Kyle Avila is a 76 y.o. 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 Most recent cardiology note reviewed.  Patient reported low oxygen saturation to 3 weeks ago.  This prompted cardiology to contact me with concern for amiodarone toxicity.  I retreated PFTs that showed stable capacities and volumes with newly reduced DLCO with subsequent high-res CT scan showed diffuse nodular GGO's concerning for pneumonitis.  Amiodarone was stopped and he was placed on prednisone 40 mg daily about 1 week ago.  In the interim.  Mild improvement in his dyspnea.  Still quite dyspneic compared to baseline.  However he has noticed his O2 saturations are improving at home.  He was walked in the office today and did not desaturate.    He notes that Dorothea Ogle last visit we did a prolonged prednisone taper given his chronic cough that responds well to this.  He can like as he was tapering down and getting the lower dose of the prednisone dose when his symptoms first began.  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  Further competent in this diagnosis given his subsequent improvement with initial steroid therapy.  Discussed at length the plan timeframe for prednisone  de-escalation of the next 3-6 months.  Need for prophylaxis versus PJP pneumonia.  He had a list about 4 pages long, a dozen or so questions they were all answered to the best of my ability.  HPI at initial visit: Cough present for ~4 years. Cough is severe. First noticed in fall and spring but now present year round. No clear exacerbating factors. Not worse in morning or at night or when supine. Constant throughout the day. Not worse when eating. Occasionally worse when eating dry crackers. Cough improves for weeks at a time after prednisone, usually 10 mg daily x 7 days. Most recent course was ~3 weeks ago.  Trialed flonase for post nasal drip and does not think it improved. Denies heartburn or regurgitation of food. Trialed once a day PPI for "months" without improved cough. Notes when first started his sputum was foul smelling after sneezing. This resolved months to years ago. Endorses occasional sinus pressure that comes and goes. Unsure if cough is worse with pressure present or if prednisone improves sinus pressure. Denies post nasal drip or watery eyes. Does not think cough varies with seasons. No h/o asthma as child or younger adult. Denies DOE but notes significant issues with angina.   Had CXR per PCP for this cough but notes its been at least a couple of years. I can not acces this image. CXR reviewed 12/2005 with new R sided pacemaker placement, lungs clear, no evidence of hyperinflation on my  interpretation.   PMH:  Smoker/ Smoking History: never smoker  Social History: Never smoker. Married. Retired from [employer].  Surgical history: CABG x 2, multiple PCIs  Family history: Father with heart attack in 1s, mother with cancer  PFT: 11/2019 reviewed and interpreted as normal spirometry, no bronchodilator response, lung volumes within normal limits, corrected DLCO within normal limits-normal PFTs  Lab Results: Personally reviewed, abs eos 300  CBC    Component Value Date/Time    WBC 3.7 (L) 09/26/2021 0916   RBC 3.60 (L) 09/26/2021 0916   HGB 12.6 (L) 09/26/2021 0916   HGB 13.1 08/13/2021 0858   HCT 39.2 09/26/2021 0916   HCT 38.7 08/13/2021 0858   PLT 159 09/26/2021 0916   PLT 154 08/13/2021 0858   MCV 108.9 (H) 09/26/2021 0916   MCV 105 (H) 08/13/2021 0858   MCH 35.0 (H) 09/26/2021 0916   MCHC 32.1 09/26/2021 0916   RDW 13.2 09/26/2021 0916   RDW 13.0 08/13/2021 0858   LYMPHSABS 0.7 05/02/2021 1537   LYMPHSABS 1.1 10/14/2019 0900   MONOABS 0.2 05/02/2021 1537   EOSABS 0.0 05/02/2021 1537   EOSABS 0.3 10/14/2019 0900   BASOSABS 0.0 05/02/2021 1537   BASOSABS 0.0 10/14/2019 0900    BMET    Component Value Date/Time   NA 140 09/26/2021 0916   NA 143 08/13/2021 0858   K 4.8 09/26/2021 0916   CL 110 09/26/2021 0916   CO2 24 09/26/2021 0916   GLUCOSE 107 (H) 09/26/2021 0916   BUN 60 (H) 09/26/2021 0916   BUN 59 (H) 08/13/2021 0858   CREATININE 2.80 (H) 09/26/2021 0916   CALCIUM 9.1 09/26/2021 0916   GFRNONAA 23 (L) 09/26/2021 0916   GFRAA 26 (L) 11/15/2019 1548    BNP No results found for: "BNP"  ProBNP No results found for: "PROBNP"   Imaging: Personally reviewed and as interpretation in HPI above.  Specialty Problems       Pulmonary Problems   Chronic pansinusitis   Chronic cough    Allergies  Allergen Reactions   Amiodarone Other (See Comments)    Lung toxicity     Immunization History  Administered Date(s) Administered   DTaP, 5 pertussis antigens 08/29/2016   Influenza Split 01/27/2011, 01/27/2012, 01/10/2013, 07/27/2013   Influenza, High Dose Seasonal PF 01/31/2019   Influenza, Quadrivalent, Recombinant, Inj, Pf 02/04/2019, 02/04/2020   Influenza-Unspecified 01/10/2021   PFIZER(Purple Top)SARS-COV-2 Vaccination 05/11/2019, 06/02/2019, 06/27/2019, 02/27/2020, 08/05/2020   Pneumococcal Conjugate-13 08/09/2014   Pneumococcal Polysaccharide-23 02/27/2007   Zoster, Live 07/27/2013    Past Medical History:  Diagnosis  Date   Arthritis    "some in my back" (04/14/2017)   CAD (coronary artery disease)    a.  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; lupus attacked them" (04/14/2017)   Mitral insufficiency    Moderate mitral regurgitation 10/02/2021   Echocardiogram 6/23: EF 45, global HK, mild ASH, mildly reduced RVSF, normal PASP, RVSP 31, mod LAD, mild RAE, mod MR, trivial AI, AV sclerosis w/o stenosis   Myocardial infarction ([place])    "I've had some mild one" (04/14/2017)   Presence of permanent cardiac pacemaker  Skin cancer    "nose"   VT (ventricular tachycardia)-nonsustained 05/20/2011    Tobacco History: Social History   Tobacco Use  Smoking Status Never   Passive exposure: Yes  Smokeless Tobacco Never  Tobacco Comments   Never smoke 10/21/21   Counseling given: Not Answered Tobacco comments: Never smoke 10/21/21   Continue to not smoke  Outpatient Encounter Medications as of 01/16/2022  Medication Sig   acetaminophen (TYLENOL) 500 MG tablet Take 1,000 mg by mouth every 6 (six) hours as needed for moderate pain.   allopurinol (ZYLOPRIM) 100 MG tablet Take 100 mg by mouth at bedtime.   amLODipine (NORVASC) 2.5 MG tablet Take 3 tablets (7.5 mg total) by mouth daily.   apixaban (ELIQUIS) 5 MG TABS tablet Take 1 tablet (5 mg total) by mouth 2 (two) times daily.   clopidogrel (PLAVIX) 75 MG tablet Take 75 mg by mouth at bedtime.   fluticasone (FLONASE) 50 MCG/ACT nasal spray Place 2 sprays into both nostrils daily as needed for allergies or rhinitis.   furosemide (LASIX) 80 MG tablet Take 1 tablet by mouth each morning and 1/2 tablet daily in the afternoon until fluid is resolved, then decrease to 1 tablet daily thereafter   isosorbide  mononitrate (IMDUR) 120 MG 24 hr tablet TAKE 1 TABLET (120 MG TOTAL) BY MOUTH DAILY.   ketoconazole (NIZORAL) 2 % cream Apply 1 application. topically daily as needed (itching toes).   losartan (COZAAR) 50 MG tablet Take 1 tablet (50 mg total) by mouth daily.   metoprolol succinate (TOPROL-XL) 50 MG 24 hr tablet TAKE 1 TABLET TWO TIMES DAILY. TAKE WITH OR IMMEDIATELY FOLLOWING MEALS.   nitroGLYCERIN (NITROSTAT) 0.4 MG SL tablet PLACE 1 TABLET UNDER THE TONGUE EVERY 5 MINUTES AS NEEDED FOR CHEST PAIN   predniSONE (DELTASONE) 20 MG tablet Take 2 tablets (40 mg total) by mouth daily with breakfast.   ranolazine (RANEXA) 1000 MG SR tablet Taking 1/2 tablet by mouth twice daily   REPATHA SURECLICK 297 MG/ML SOAJ Inject 140 mg into the skin every 14 (fourteen) days.   sulfamethoxazole-trimethoprim (BACTRIM) 400-80 MG tablet Take 1 tablet by mouth 3 (three) times a week. Monday, Wednesday, and Friday.   tamsulosin (FLOMAX) 0.4 MG CAPS Take 0.4 mg by mouth daily.   [DISCONTINUED] azelastine (ASTELIN) 0.1 % nasal spray Place 2 sprays into both nostrils 2 (two) times daily. Use in each nostril as directed   No facility-administered encounter medications on file as of 01/16/2022.     Review of Systems  Review of Systems  N/a  Physical Exam  BP 122/62 (BP Location: Left Arm, Patient Position: Sitting, Cuff Size: Normal)   Pulse (!) 55   Ht '5\' 6"'$  (1.676 m)   Wt 169 lb 12.8 oz (77 kg)   SpO2 95%   BMI 27.41 kg/m   Wt Readings from Last 5 Encounters:  01/16/22 169 lb 12.8 oz (77 kg)  01/09/22 163 lb 3.2 oz (74 kg)  11/27/21 165 lb 6.4 oz (75 kg)  11/11/21 165 lb 9.6 oz (75.1 kg)  10/21/21 164 lb 6.4 oz (74.6 kg)    BMI Readings from Last 5 Encounters:  01/16/22 27.41 kg/m  01/09/22 26.34 kg/m  11/27/21 25.15 kg/m  11/11/21 25.18 kg/m  10/21/21 25.00 kg/m     Physical Exam General: well appearing, no acute distress Eyes: EOMI, no icterus Respiratory: NWOB, lungs CTAB, no  wheeze Cardiovascular: RRR, no murmurs, no gallops Skin: Warm, no rash, no lesions Neurologic: normal gait, CN  intact Psych: Normal mood, full affect   Assessment & Plan:   Kyle Avila is a 76 y.o. 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  Chronic cough: Nagging for many years.  Productive.  Resolved with prednisone.  Not responded well to asthma therapy including triple inhaled therapy and N[place] (eosinophilia in the past).  Suspect this is from sinuses with CT sinuses 12/2021 revealing signs of chronic sinusitis.  Would like.  To be evaluated for ENT in the future if cough were to recur after prednisone taper.  I have tried everything I can without success and this does not appear to be coming from the lungs.  Amiodarone lung toxicity: Based on new hypoxemia and dyspnea as well as imaging findings and reduced DLCO.  No desaturation in clinic today with walking or at rest, no role for oxygen at this time.  Improving with prednisone therapy.  Plan prednisone 40 mg for 1 month, followed by 30 mg for 1 month, then subsequent taper to 20 mg.  Speed of which tapering the 20 mg and below depends on clinical response.  Suspect will do 20 mg for 2 weeks and then decrease by 5 mg every 2 weeks.  This would give [place] around 4 months of prednisone therapy.  We can always slow down and go down by 5 mg every month or so instructed up to about 6 months if needed.  Amiodarone has been discontinued and added to his allergy list.  Bactrim prophylaxis Monday Wednesday Friday while on doses of prednisone greater than  20 mg, new prescription sent today.   Return in about 8 weeks (around 03/13/2022).   Lanier Clam, MD 01/17/2022   This appointment required 48 minutes of patient care (this includes precharting, chart review, review of results, face-to-face care, etc.).

## 2022-01-17 NOTE — Telephone Encounter (Signed)
Patient called and wanted to make sure this message was handled as communication in the past has not been great. Patient requested message be sent to April.

## 2022-01-21 ENCOUNTER — Telehealth: Payer: Self-pay | Admitting: *Deleted

## 2022-01-21 NOTE — Telephone Encounter (Signed)
   Patient Name: Kyle Avila  DOB: 07-Apr-1946 MRN: [id number]  Primary Cardiologist: [name], MD  Chart reviewed as part of pre-operative protocol coverage.   Simple dental extractions (i.e. 1-2 teeth) are considered low risk procedures per guidelines and generally do not require any specific cardiac clearance. It is also generally accepted that for simple extractions and dental cleanings, there is no need to interrupt blood thinner therapy.  SBE prophylaxis is not required for the patient from a cardiac standpoint.  I will route this recommendation to the requesting party via Epic fax function and remove from pre-op pool.  Please call with questions.  Loel Dubonnet, NP 01/21/2022, 5:46 PM

## 2022-01-21 NOTE — Telephone Encounter (Signed)
   Pre-operative Risk Assessment    Patient Name: Kyle Avila  DOB: 22-Mar-1946 MRN: [id number]     Request for Surgical Clearance    Procedure:   PT IS ONLY HAVING 1 FILLING DONE ; THIS WAS CONFIRMED WITH DDS OFFICE TODAY  Date of Surgery:  Clearance TBD                                 Surgeon:  DR. Martinique THOMAS, DDS Surgeon's Group or Practice Name:  [facility]  Phone number:  [phone] Fax number:  [phone]   Type of Clearance Requested:   - Medical  - Pharmacy:  Hold Clopidogrel (Plavix) and Apixaban (Eliquis)     Type of Anesthesia:  Local , EPI MAY BE USED   Additional requests/questions:    Jiles Prows   01/21/2022, 11:17 AM

## 2022-01-22 ENCOUNTER — Other Ambulatory Visit: Payer: Self-pay | Admitting: *Deleted

## 2022-01-22 ENCOUNTER — Encounter: Payer: Self-pay | Admitting: Cardiovascular Disease

## 2022-01-22 ENCOUNTER — Encounter: Payer: Self-pay | Admitting: Pulmonary Disease

## 2022-01-22 DIAGNOSIS — J984 Other disorders of lung: Secondary | ICD-10-CM

## 2022-01-22 DIAGNOSIS — I5042 Chronic combined systolic (congestive) and diastolic (congestive) heart failure: Secondary | ICD-10-CM

## 2022-01-23 ENCOUNTER — Ambulatory Visit: Payer: Medicare HMO | Admitting: Cardiovascular Disease

## 2022-01-23 DIAGNOSIS — K219 Gastro-esophageal reflux disease without esophagitis: Secondary | ICD-10-CM | POA: Diagnosis not present

## 2022-01-23 DIAGNOSIS — D696 Thrombocytopenia, unspecified: Secondary | ICD-10-CM | POA: Diagnosis not present

## 2022-01-23 DIAGNOSIS — D692 Other nonthrombocytopenic purpura: Secondary | ICD-10-CM | POA: Diagnosis not present

## 2022-01-23 DIAGNOSIS — I480 Paroxysmal atrial fibrillation: Secondary | ICD-10-CM | POA: Diagnosis not present

## 2022-01-24 ENCOUNTER — Encounter: Payer: Self-pay | Admitting: Pulmonary Disease

## 2022-01-26 NOTE — Progress Notes (Unsigned)
Cardiology Office Note Date:  01/26/2022  Patient ID:  Kyle Avila, Kyle Avila 1946/01/30, MRN [id number] PCP:  [name], MD  Cardiologist:  Dr. Burt Knack Electrophysiologist: Dr. Caryl Comes    Chief Complaint: *** Afib, amio tox   History of Present Illness: Kyle Avila is a 76 y.o. male with history of CAD, chronic CHF (combined), PVD (carotid), CHB w/PPM, CKD (IV), HTN, HLD, AFib  He saw cardiology service w/S. 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 Burt Knack in July with some ongoing angina since his PCI in June, though had mistakenly not been taking his amlodipine and once resumed doing better. Taking a s/l NTG prior to his walks with no anginal symptoms No changes were made, planned to see Dr. Burt Knack in 18mo   Last seen by Dr. KCaryl Comes him briefly while he came in July 2021 for unusual fatigue, to the device clinic for pacer evaluation, device noted without abnormal findings, RN note mentions Dr. KCaryl Comessaw him, no changes were recommended, at this time, had noted missed amlodipine, he was feeling better back on it.  I saw him 04/13/20 Since that day of skiing and since home he has felt quite well.  IN fact, says that he has not needed to take a single s/l NTG since November 1st. He had not been skiing in 2 years [place].  He mentions that usually their group is larger and the time between runs is longer with somewhat built in rest periods, though with a smaller group, minimal waiting time between runs.  He was not on the steepest of runs,  but not small either, and somewhere between 7-8,006felevation. As we spoke, it did not seem that he was acutely/suddenly becoming tired or weak but as the day wore on, and by the time they were done he was "absolutely exhausted".  One of his friends even offered to carry his skis back to the lodge.  Outside of skiing they had been doing quite a bit of walking and was much more physically active the he has been in years He had no CP, no palpitations or cardiac awareness, and not even really SOB, just very tired. Otherwise her is going quite well, goes to the gym, is active and denies any symptoms, no exertional intolerances, walks regularly. No near syncope or syncope. He has a chronic cough for years that he says no one has been able to fiugure out or cure. (I rechecked his O2 sat was 96-97% RA) He is not perfect about his medicines, admits to occasionally missing some pills, this will include his asa or plavix. Initially I suspected perhaps upper rate behaviors with plans likely to increase tracking rates, though ultimately felt he had just likely overdone it with highly exertional activity he had not done in years and no changes were made.  He saw Dr. CoBurt Knack1/28/22, he was feeling well, working out and trying to ready himself physically [place] have better stamina this [place].  I saw him Dec 2022 He had a great skiing trip. Felt well skiing, had no CP whatsoever, it did make him exhausted at the end of the day, but felt great skiing. He is following with nephrology regularly No near syncope or syncope.  Dr. Burt Knack 06/05/21, developed HF on a skiing trip, meds adjusted, was also seeing nephrology. Weaned off amlodipine, lasix adjusted, weight was back to baseline  Found with AFib in device remote/alert, > AFib clinic 06/24/21, started on Eliquis, ASA stopped (Plavix continued) in d/w dr. Burt Knack, and planned for DCCV once fully a/c. Discussed getting  sleep study, pt deferred.  Dr. Burt Knack, 07/15/21, remained in AF, planned for DCCV, no med changed DCCV successful 07/17/21   Dr. Burt Knack 07/30/21, recent c/o CP, ER visit with flat Trops, resumed amlodipine with improvement in symptoms.  CKD IIIb, and risks/benefit of cath discussed, planned to monitor symptoms Notably that he did not feel much better in SR post DCCV.  >>>  CATH w/PCI 08/15/21, Successful PTCA with scoring balloon angioplasty of both stented segments in the SVG to PDA  08/28/21, S. Weaver, PA-C, angina much better, had recurrent Afib, there was discussion on amio though in review with Dr. Burt Knack, patient, not clear that he was having symptoms associated with AF and AAD not pursued.  Subsequent telephone notes started on amiodarone   AFib clinic 09/26/21 maintained on '200mg'$  BID until after DCCV DCCV successful 10/08/21 Clinic visit 10/21/21, maintaining SR > '200mg'$  daily  Dr. Burt Knack 11/11/21, overall appeared stable, plannedto repeat echo in the summer, planned for labs and a 25mof/u  He has developed concerns of pulmonary toxicity, seeing Dr. HSilas Flood Saw Dr. TAli Lowefor DOD visit 01/09/22, amiodarone stopped and started on prednisone with plans to f/u with pulmonary. Felt to be euvolemic. Pt reported at this visit that he was quite symptomatic with the AFib and given that would need to discuss alternative rhythm strategies.  Dr. HSilas Flood9/212/23, O2 sats were starting to improve, still winded with exertion, much different then his baseline.  Planned for prolonged prednisone taper. Planned for pulm rehab   *** Is there a role for ablation?, CKD IV, clearance 24, not great for tikosyn *** AF burden *** eliquis, dose, bleeding, labs *** volume   Device information MDT dual chamber PPM implanted 2007, gen change 09/13/2014   AFib Hx Found via device alert, Feb 2023 Amio started May 2023 > stopped Sept 2023 with pulmonary toxicity  Past Medical History:  Diagnosis  Date   Arthritis    "some in my back" (04/14/2017)   CAD (coronary artery disease)    a.  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[facility]    s/p PTVDP   Gout    HTN (hypertension)    Hyperlipidemia    [place]us [place]    "that's why my kidneys are gone; lupus attacked them" (04/14/2017)   Mitral insufficiency    Moderate mitral regurgitation 10/02/2021   Echocardiogram 6/23: EF 424 global HK, mild ASH, mildly reduced RVSF, normal PASP, RVSP 31, mod LAD, mild RAE, mod MR, trivial AI, AV sclerosis w/o stenosis   Myocardial infarction ([facility]    "I've had some mild one" (04/14/2017)   Presence of permanent cardiac pacemaker    Skin cancer    "  nose"   VT (ventricular tachycardia)-nonsustained 05/20/2011    Past Surgical History:  Procedure Laterality Date   ACHILLES TENDON REPAIR Right    CARDIAC CATHETERIZATION     CARDIAC CATHETERIZATION N/A 11/03/2014   Procedure: Left Heart Cath and Cors/Grafts Angiography;  Surgeon: [name], MD;  Location: [place] [facility];  Service: Cardiovascular;  Laterality: N/A;   CARDIAC CATHETERIZATION N/A 11/03/2014   Procedure: Coronary Stent Intervention;  Surgeon: [name], MD;  Location: [place] [facility];  Service: Cardiovascular;  Laterality: N/A;   CARDIOVERSION N/A 07/17/2021   Procedure: CARDIOVERSION;  Surgeon: [name], MD;  Location: [place];  Service: Cardiovascular;  Laterality: N/A;   CARDIOVERSION N/A 10/08/2021   Procedure: CARDIOVERSION;  Surgeon: Freada Bergeron, MD;  Location[facility];  Service: Cardiovascular;  Laterality: N/A;   CAROTID ENDARTERECTOMY  2002   "? side"   CATARACT EXTRACTION W/ INTRAOCULAR LENS IMPLANT     "? side"   CORONARY ANGIOPLASTY WITH STENT PLACEMENT  04/14/2017   CORONARY ANGIOPLASTY WITH STENT PLACEMENT  11/03/2014   SVG PVA   CORONARY ARTERY BYPASS  GRAFT     with redo cabg   CORONARY BALLOON ANGIOPLASTY N/A 08/15/2021   Procedure: CORONARY BALLOON ANGIOPLASTY;  Surgeon: Burnell Blanks, MD;  Location: [place] [facility];  Service: Cardiovascular;  Laterality: N/A;   CORONARY STENT INTERVENTION N/A 10/18/2019   Procedure: CORONARY STENT INTERVENTION;  Surgeon: [name], MD;  Location: [place] [facility];  Service: Cardiovascular;  Laterality: N/A;   CORONARY/GRAFT ANGIOGRAPHY N/A 10/18/2019   Procedure: CORONARY/GRAFT ANGIOGRAPHY;  Surgeon: [name], MD;  Location: [place] [facility];  Service: Cardiovascular;  Laterality: N/A;   EP IMPLANTABLE DEVICE N/A 09/13/2014   Procedure: PPM Generator Changeout;  Surgeon: Deboraha Sprang, MD;  Location: [place] [facility];  Service: Cardiovascular;  Laterality: N/A;   LEFT HEART CATH AND CORS/GRAFTS ANGIOGRAPHY N/A 04/14/2017   Procedure: LEFT HEART CATH AND CORS/GRAFTS ANGIOGRAPHY;  Surgeon: [name], MD;  Location: [place] [facility];  Service: Cardiovascular;  Laterality: N/A;   LEFT HEART CATH AND CORS/GRAFTS ANGIOGRAPHY N/A 08/15/2021   Procedure: LEFT HEART CATH AND CORS/GRAFTS ANGIOGRAPHY;  Surgeon: Burnell Blanks, MD;  Location: [place] [facility];  Service: Cardiovascular;  Laterality: N/A;   MOHS SURGERY     "just outside my nose"   PACEMAKER INSERTION  2007   TONSILLECTOMY  1958    Current Outpatient Medications  Medication Sig Dispense Refill   acetaminophen (TYLENOL) 500 MG tablet Take 1,000 mg by mouth every 6 (six) hours as needed for moderate pain.     allopurinol (ZYLOPRIM) 100 MG tablet Take 100 mg by mouth at bedtime.     amLODipine (NORVASC) 2.5 MG tablet Take 3 tablets (7.5 mg total) by mouth daily. 90 tablet 2   apixaban (ELIQUIS) 5 MG TABS tablet Take 1 tablet (5 mg total) by mouth 2 (two) times daily. 180 tablet 2   clopidogrel (PLAVIX) 75 MG tablet Take 75 mg by mouth at bedtime.     fluticasone (FLONASE) 50 MCG/ACT nasal spray Place 2  sprays into both nostrils daily as needed for allergies or rhinitis. 16 g 3   furosemide (LASIX) 80 MG tablet Take 1 tablet by mouth each morning and 1/2 tablet daily in the afternoon until fluid is resolved, then decrease to 1 tablet daily thereafter 90 tablet 3   isosorbide mononitrate (IMDUR) 120 MG 24 hr tablet TAKE 1 TABLET (120 MG  TOTAL) BY MOUTH DAILY. 90 tablet 3   ketoconazole (NIZORAL) 2 % cream Apply 1 application. topically daily as needed (itching toes).     losartan (COZAAR) 50 MG tablet Take 1 tablet (50 mg total) by mouth daily.     metoprolol succinate (TOPROL-XL) 50 MG 24 hr tablet TAKE 1 TABLET TWO TIMES DAILY. TAKE WITH OR IMMEDIATELY FOLLOWING MEALS. 180 tablet 3   nitroGLYCERIN (NITROSTAT) 0.4 MG SL tablet PLACE 1 TABLET UNDER THE TONGUE EVERY 5 MINUTES AS NEEDED FOR CHEST PAIN 25 tablet 3   predniSONE (DELTASONE) 20 MG tablet Take 2 tablets (40 mg total) by mouth daily with breakfast. 90 tablet 2   ranolazine (RANEXA) 1000 MG SR tablet Taking 1/2 tablet by mouth twice daily     REPATHA SURECLICK 774 MG/ML SOAJ Inject 140 mg into the skin every 14 (fourteen) days. 6 mL 3   sulfamethoxazole-trimethoprim (BACTRIM) 400-80 MG tablet Take 1 tablet by mouth 3 (three) times a week. Monday, Wednesday, and Friday. 30 tablet 0   tamsulosin (FLOMAX) 0.4 MG CAPS Take 0.4 mg by mouth daily.     No current facility-administered medications for this visit.    Allergies:   Amiodarone   Social History:  The patient  reports that he has never smoked. He has been exposed to tobacco smoke. He has never used smokeless tobacco. He reports current alcohol use of about 3.0 standard drinks of alcohol per week. He reports that he does not use drugs.   Family History:  [name] family history includes Cancer in his mother; Heart attack in his father; Heart disease in his father.  ROS:  Please see the history of present illness.    All other systems are reviewed and otherwise negative.    PHYSICAL EXAM:  VS:  There were no vitals taken for this visit. BMI: There is no height or weight on file to calculate BMI. Well nourished, well developed, in no acute distress HEENT: normocephalic, atraumatic Neck: no JVD, carotid bruits or masses Cardiac:  *** RRR; no significant murmurs, no rubs, or gallops Lungs:  *** CTA b/l, no wheezing, rhonchi or rales Abd: soft, nontender MS: no deformity or atrophy Ext: *** trace, slightly more edema, he reports at his baseline, chronic looking skin changes Skin: warm and dry, no rash Neuro:  No gross deficits appreciated Psych: euthymic mood, full affect  *** PPM site (R side) is stable, no tethering or discomfort   EKG:  not done today  Device interrogation done today and reviewed by myself:  ***  11/11/21: TTE  1. Left ventricular ejection fraction, by estimation, is 45%. The left  ventricle has mildly decreased function. The left ventricle demonstrates  global hypokinesis. There is mild asymmetric left ventricular hypertrophy  of the septal segment. Left  ventricular diastolic parameters are indeterminate.   2. Right ventricular systolic function is mildly reduced. The right  ventricular size is normal. There is normal pulmonary artery systolic  pressure. The estimated right ventricular systolic pressure is 12.8 mmHg.   3. Left atrial size was moderately dilated.   4. Right atrial size was mildly dilated.   5. The mitral valve is normal in structure. Moderate mitral valve  regurgitation. No evidence of mitral stenosis. Systolic blunting of  pulmonary vein flow.   6. The aortic valve is tricuspid. There is moderate calcification of the  aortic valve. There is moderate thickening of the aortic valve. Aortic  valve regurgitation is trivial. Aortic valve sclerosis is present, with  no  evidence of aortic valve stenosis.   7. The inferior vena cava is dilated in size with <50% respiratory  variability, suggesting right atrial  pressure of 15 mmHg.   Comparison(s): Compared to 2021 study, mitral regurgitation has increased.  Cannot access 05/18/21 study   Left Cardiac Catheterization 08/15/2021:   LM lesion is 100% stenosed.   Mid LAD to Dist LAD lesion is 40% stenosed.   Mid LAD lesion is 75% stenosed.   Ost LAD lesion is 100% stenosed.   Ost Ramus to Ramus lesion is 100% stenosed.   Ost RCA lesion is 100% stenosed.   Prox Graft lesion is 100% stenosed.   1st Mrg lesion is 100% stenosed.   Dist Graft lesion is 80% stenosed.   Prox Graft lesion is 99% stenosed.   Ost Cx to Prox Cx lesion is 10% stenosed.   Non-stenotic RPDA lesion was previously treated.   Scoring balloon angioplasty was performed using a BALLN WOLVERINE 4.00X10.   Scoring balloon angioplasty was performed using a BALLN WOLVERINE 4.00X10.   Post intervention, there is a 10% residual stenosis.   Post intervention, there is a 30% residual stenosis.   LIMA and is normal in caliber.   and is large.   The graft exhibits no disease.   Severe triple vessel CAD s/p CABG with 2/3 patent bypass grafts Chronic occlusion of the ostial LAD. The mid and distal LAD fills from the patent LIMA graft. Stable moderate stenosis at the anastomosis of the LIMA gaft to the LAD. Chronic occlusion of the intermediate branch that fills from left to left collaterals supplied by the LIMA to LAD.  Patent proximal and mid Circumflex stents. Known occlusion of SVG to OM (non injected). Chronic occlusion ostial RCA (non injected). Patent SVG to PDA. Severe stenosis in the stented segment in the proximal SVG and severe stenosis in the stented segment in the distal SVG.  Successful PTCA with scoring balloon angioplasty of both stented segments in the SVG to PDA. Normal LVEDP.   Recommendations: Continue Plavix. I will have him take ASA in the am but this can be stopped once his Eliquis is resumed.       10/25/2020: Carotid US Summary:  Right Carotid:  Non-hemodynamically significant plaque <50% noted in the  CCA. The                 carotid endarterectomy site is well visualized  demonstrating                 patency with normal velocities.   Left Carotid: Velocities in the left ICA are consistent with a 1-39%  stenosis.                Non-hemodynamically significant plaque <50% noted in the  CCA.   Vertebrals:  Bilateral vertebral arteries demonstrate antegrade flow.  Subclavians: Normal flow hemodynamics were seen in bilateral subclavian               arteries.    02/24/2020 LE arterial US Summary:  Right: Resting right ankle-brachial index is within normal range. No  evidence of significant right lower extremity arterial disease. The right  toe-brachial index is normal.   Left: Resting left ankle-brachial index is within normal range. No  evidence of significant left lower extremity arterial disease. The left  toe-brachial index is normal. .     Cardiac catheterization 10/18/19 LM 100 LAD ost 100, mid 2 at anastomosis, dist 40 RI ost 100 LCx ost  20; OM1 100 RCA 100 CTO; RPDA 95 S-RPDA prox stent patent w 10 ISR, dist stent 90 ISR S-OM1 100 CTO L-LAD patent  PCI:  2.5 x 12 mm Resolute Onyx DES to RPDA; cutting balloon POBA to S-RPDA      Echocardiogram 08/19/19 EF 45-50, Gr 2 DD, inf-lat HK, normal RVSF, RVSP 31.5, mod BAE, mild MR, trivial AI   Echo 06/24/2016 Mild concentric LVH, EF 45-50, inferolateral/inferior/inferoseptal hypokinesis, grade 1 diastolic dysfunction, mild MR, mild LAE, mildly reduced RVSF, mild TR, PASP 32   Myoview 02/2014 Intermediate risk stress nuclear study with a fixed medium-sized, severe basal to mid inferolateral and basal to mid anterolateral perfusion defect.  This suggests prior infarction without significant ischemia.  Study rated intermediate risk due to low EF at 42%. LV Ejection Fraction: 42%.  LV Wall Motion:  Lateral and inferior hypokinesis.    Carotid US 04/6107 R CEA  patent LICA < 60%    Recent Labs: 09/26/2021: BUN 60; Creatinine, Ser 2.80; Hemoglobin 12.6; Platelets 159; Potassium 4.8; Sodium 140  No results found for requested labs within last 365 days.   CrCl cannot be calculated (Patient's most recent lab result is older than the maximum 21 days allowed.).   Wt Readings from Last 3 Encounters:  01/16/22 169 lb 12.8 oz (77 kg)  01/09/22 163 lb 3.2 oz (74 kg)  11/27/21 165 lb 6.4 oz (75 kg)     Other studies reviewed: Additional studies/records reviewed today include: summarized above  ASSESSMENT AND PLAN:  1. PPM     *** Intact function     *** No programming changes made   2. CAD     ***  He denies any angina, no use of his NTG in over a month     On ASA, plavix, BB, nitrate/ranexa, Repatha      *** c/w Dr. Burt Knack   3. HTN     *** Looks ok, no changes  4. HLD     Not adressed today  5. PVD     Carotid US June 2022 looks OK     *** No bruits appreciated  6. Persistent Afib CHA2DS2Vasc is 4, on Eliquis, *** appropriately dosed *** % burden pulm toxicity With his CKD (IV), tikosyn not a reasonable alternative, at best would be a 166mg dose, and almost to stage would not qualify at all. ***      Disposition: ***   Current medicines are reviewed at length with the patient today.  The patient did not have any concerns regarding medicines.  SVenetia Night PA-C 01/26/2022 10:09 AM     [facility] [address] [place] [phone])  [phone])

## 2022-01-27 ENCOUNTER — Ambulatory Visit: Payer: Medicare HMO | Attending: Physician Assistant | Admitting: Physician Assistant

## 2022-01-27 ENCOUNTER — Ambulatory Visit: Payer: Medicare HMO | Admitting: Pulmonary Disease

## 2022-01-27 VITALS — BP 130/60 | HR 60 | Ht 66.0 in | Wt 168.0 lb

## 2022-01-27 DIAGNOSIS — I2510 Atherosclerotic heart disease of native coronary artery without angina pectoris: Secondary | ICD-10-CM | POA: Diagnosis not present

## 2022-01-27 DIAGNOSIS — Z950 Presence of cardiac pacemaker: Secondary | ICD-10-CM | POA: Diagnosis not present

## 2022-01-27 DIAGNOSIS — I255 Ischemic cardiomyopathy: Secondary | ICD-10-CM | POA: Diagnosis not present

## 2022-01-27 DIAGNOSIS — I5022 Chronic systolic (congestive) heart failure: Secondary | ICD-10-CM

## 2022-01-27 DIAGNOSIS — I4819 Other persistent atrial fibrillation: Secondary | ICD-10-CM

## 2022-01-27 DIAGNOSIS — Z79899 Other long term (current) drug therapy: Secondary | ICD-10-CM

## 2022-01-27 DIAGNOSIS — I10 Essential (primary) hypertension: Secondary | ICD-10-CM

## 2022-01-27 LAB — CUP PACEART INCLINIC DEVICE CHECK
Battery Impedance: 1008 Ohm
Battery Remaining Longevity: 53 mo
Battery Voltage: 2.77 V
Brady Statistic AP VP Percent: 94 %
Brady Statistic AP VS Percent: 2 %
Brady Statistic AS VP Percent: 4 %
Brady Statistic AS VS Percent: 0 %
Date Time Interrogation Session: 20231002130018
Implantable Lead Implant Date: 20070831
Implantable Lead Implant Date: 20070831
Implantable Lead Location: 753859
Implantable Lead Location: 753860
Implantable Lead Model: 4469
Implantable Lead Model: 4470
Implantable Lead Serial Number: 485949
Implantable Lead Serial Number: 553665
Implantable Pulse Generator Implant Date: 20160518
Lead Channel Impedance Value: 438 Ohm
Lead Channel Impedance Value: 479 Ohm
Lead Channel Pacing Threshold Amplitude: 0.5 V
Lead Channel Pacing Threshold Amplitude: 0.5 V
Lead Channel Pacing Threshold Amplitude: 0.625 V
Lead Channel Pacing Threshold Amplitude: 0.75 V
Lead Channel Pacing Threshold Pulse Width: 0.4 ms
Lead Channel Pacing Threshold Pulse Width: 0.4 ms
Lead Channel Pacing Threshold Pulse Width: 0.4 ms
Lead Channel Pacing Threshold Pulse Width: 0.4 ms
Lead Channel Sensing Intrinsic Amplitude: 1.4 mV
Lead Channel Setting Pacing Amplitude: 2 V
Lead Channel Setting Pacing Amplitude: 2.5 V
Lead Channel Setting Pacing Pulse Width: 0.4 ms
Lead Channel Setting Sensing Sensitivity: 4 mV

## 2022-01-27 NOTE — Patient Instructions (Addendum)
Medication Instructions:   Your physician recommends that you continue on your current medications as directed. Please refer to the Current Medication list given to you today.   *If you need a refill on your cardiac medications before your next appointment, please call your pharmacy*   Lab Work: BMET AND CBC  TODAY   If you have labs (blood work) drawn today and your tests are completely normal, you will receive your results only by: [place] (if you have MyChart) OR A paper copy in the mail If you have any lab test that is abnormal or we need to change your treatment, we will call you to review the results.   Testing/Procedures: NONE ORDERED  TODAY   Follow-Up: At [facility], you and your health needs are our priority.  As part of our continuing mission to provide you with exceptional heart care, we have created designated Provider Care Teams.  These Care Teams include your primary Cardiologist (physician) and Advanced Practice Providers (APPs -  Physician Assistants and Nurse Practitioners) who all work together to provide you with the care you need, when you need it.  We recommend signing up for the patient portal called "MyChart".  Sign up information is provided on this After Visit Summary.  MyChart is used to connect with patients for Virtual Visits (Telemedicine).  Patients are able to view lab/test results, encounter notes, upcoming appointments, etc.  Non-urgent messages can be sent to your provider as well.   To learn more about what you can do with MyChart, go to [email or web address].    Your next appointment:   2 month(s)  The format for your next appointment:   In Person  Provider:   [name], MD    Other Instructions   Important Information About Sugar

## 2022-01-28 ENCOUNTER — Encounter: Payer: Self-pay | Admitting: Cardiovascular Disease

## 2022-01-28 LAB — BASIC METABOLIC PANEL
BUN/Creatinine Ratio: 28 — ABNORMAL HIGH (ref 10–24)
BUN: 76 mg/dL (ref 8–27)
CO2: 24 mmol/L (ref 20–29)
Calcium: 9.3 mg/dL (ref 8.6–10.2)
Chloride: 98 mmol/L (ref 96–106)
Creatinine, Ser: 2.73 mg/dL — ABNORMAL HIGH (ref 0.76–1.27)
Glucose: 143 mg/dL — ABNORMAL HIGH (ref 70–99)
Potassium: 5.1 mmol/L (ref 3.5–5.2)
Sodium: 138 mmol/L (ref 134–144)
eGFR: 23 mL/min/{1.73_m2} — ABNORMAL LOW (ref >59)

## 2022-01-28 LAB — CBC
Hematocrit: 38.2 % (ref 37.5–51.0)
Hemoglobin: 12.8 g/dL — ABNORMAL LOW (ref 13.0–17.7)
MCH: 36 pg — ABNORMAL HIGH (ref 26.6–33.0)
MCHC: 33.5 g/dL (ref 31.5–35.7)
MCV: 107 fL — ABNORMAL HIGH (ref 79–97)
Platelets: 138 10*3/uL — ABNORMAL LOW (ref 150–450)
RBC: 3.56 x10E6/uL — ABNORMAL LOW (ref 4.14–5.80)
RDW: 13.9 % (ref 11.6–15.4)
WBC: 6.5 10*3/uL (ref 3.4–10.8)

## 2022-01-29 ENCOUNTER — Other Ambulatory Visit: Payer: Self-pay

## 2022-01-29 ENCOUNTER — Encounter: Payer: Medicare HMO | Attending: Cardiovascular Disease

## 2022-01-29 DIAGNOSIS — Z48812 Encounter for surgical aftercare following surgery on the circulatory system: Secondary | ICD-10-CM | POA: Insufficient documentation

## 2022-01-29 DIAGNOSIS — Z9861 Coronary angioplasty status: Secondary | ICD-10-CM | POA: Insufficient documentation

## 2022-01-29 NOTE — Progress Notes (Signed)
Virtual Visit completed. Patient informed on EP and RD appointment and 6 Minute walk test. Patient also informed of patient health questionnaires on My Chart. Patient Verbalizes understanding. Visit diagnosis can be found in [facility] 01/09/2022.

## 2022-01-30 ENCOUNTER — Encounter: Payer: Self-pay | Admitting: Internal Medicine

## 2022-02-03 NOTE — Telephone Encounter (Signed)
Opened in error

## 2022-02-04 ENCOUNTER — Encounter: Payer: Self-pay | Admitting: Pulmonary Disease

## 2022-02-04 MED ORDER — SULFAMETHOXAZOLE-TRIMETHOPRIM 400-80 MG PO TABS: 1.0000 | ORAL_TABLET | ORAL | 0 refills | Status: DC

## 2022-02-04 NOTE — Telephone Encounter (Signed)
Dr Silas Flood, please advise on pt email:  P [facility] (supporting Lanier Clam, MD) 21 minutes ago (9:43 AM)    Will be cutting back to 30 MG of Prednizone on 15th. By then I will have exhausted the sulfamethoxazole.  Current issues I believe are caused by my consumption of Prednizone: Sores on my lips that will not heal Tenderness in the gums Edema in ankles there but not real bad. My efforts to keep my weight near 160 aren't working now weighing around 165 in the morning  What test / scans could be done to see if our prednizone treatment is improving my lung condition. So far I have noticed little performance improvement.

## 2022-02-05 ENCOUNTER — Encounter: Payer: Self-pay | Admitting: Cardiovascular Disease

## 2022-02-05 ENCOUNTER — Encounter: Payer: Medicare HMO | Admitting: *Deleted

## 2022-02-05 VITALS — Ht 68.0 in | Wt 172.7 lb

## 2022-02-05 DIAGNOSIS — Z9861 Coronary angioplasty status: Secondary | ICD-10-CM

## 2022-02-05 DIAGNOSIS — Z48812 Encounter for surgical aftercare following surgery on the circulatory system: Secondary | ICD-10-CM | POA: Diagnosis not present

## 2022-02-05 DIAGNOSIS — H353211 Exudative age-related macular degeneration, right eye, with active choroidal neovascularization: Secondary | ICD-10-CM | POA: Diagnosis not present

## 2022-02-05 DIAGNOSIS — I5022 Chronic systolic (congestive) heart failure: Secondary | ICD-10-CM

## 2022-02-05 DIAGNOSIS — Z79899 Other long term (current) drug therapy: Secondary | ICD-10-CM

## 2022-02-05 NOTE — Patient Instructions (Signed)
Patient Instructions  Patient Details  Name: Kyle Avila MRN: [id number] Date of Birth: 1945-06-07 Referring Provider:  [name], MD  Below are your personal goals for exercise, nutrition, and risk factors. Our goal is to help you stay on track towards obtaining and maintaining these goals. We will be discussing your progress on these goals with you throughout the program.  Initial Exercise Prescription:  Initial Exercise Prescription - 02/05/22 1200       Date of Initial Exercise RX and Referring Provider   Date 02/05/22    Referring Provider [name] MD      Oxygen   Maintain Oxygen Saturation 88% or higher      Treadmill   MPH 1.8    Grade 0.5    Minutes 15    METs 2.5      Recumbant Bike   Level 1    RPM 50    Watts 10    Minutes 15    METs 2      REL-XR   Level 2    Speed 50    Minutes 15    METs 2      Track   Laps 27    Minutes 15    METs 2.47      Prescription Details   Frequency (times per week) 2    Duration Progress to 30 minutes of continuous aerobic without signs/symptoms of physical distress      Intensity   THRR 40-80% of Max Heartrate 92-126    Ratings of Perceived Exertion 11-13    Perceived Dyspnea 0-4      Progression   Progression Continue to progress workloads to maintain intensity without signs/symptoms of physical distress.      Resistance Training   Training Prescription Yes    Weight 4 lb    Reps 10-15             Exercise Goals: Frequency: Be able to perform aerobic exercise two to three times per week in program working toward 2-5 days per week of home exercise.  Intensity: Work with a perceived exertion of 11 (fairly light) - 15 (hard) while following your exercise prescription.  We will make changes to your prescription with you as you progress through the program.   Duration: Be able to do 30 to 45 minutes of continuous aerobic exercise in addition to a 5 minute warm-up and a 5 minute cool-down  routine.   Nutrition Goals: Your personal nutrition goals will be established when you do your nutrition analysis with the dietician.  The following are general nutrition guidelines to follow: Cholesterol < '200mg'$ /day Sodium < '1500mg'$ /day Fiber: Men over 50 yrs - 30 grams per day   Personal Goals:  Personal Goals and Risk Factors at Admission - 02/05/22 1224       Core Components/Risk Factors/Patient Goals on Admission    Weight Management Yes;Obesity    Intervention Weight Management: Develop a combined nutrition and exercise program designed to reach desired caloric intake, while maintaining appropriate intake of nutrient and fiber, sodium and fats, and appropriate energy expenditure required for the weight goal.;Weight Management: Provide education and appropriate resources to help participant work on and attain dietary goals.;Weight Management/Obesity: Establish reasonable short term and long term weight goals.;Obesity: Provide education and appropriate resources to help participant work on and attain dietary goals.    Admit Weight 172 lb 11.2 oz (78.3 kg)    Goal Weight: Short Term 168 lb (76.2 kg)  Goal Weight: Long Term 160 lb (72.6 kg)    Expected Outcomes Short Term: Continue to assess and modify interventions until short term weight is achieved;Long Term: Adherence to nutrition and physical activity/exercise program aimed toward attainment of established weight goal;Weight Loss: Understanding of general recommendations for a balanced deficit meal plan, which promotes 1-2 lb weight loss per week and includes a negative energy balance of [phone] kcal/d;Understanding recommendations for meals to include 15-35% energy as protein, 25-35% energy from fat, 35-60% energy from carbohydrates, less than '200mg'$  of dietary cholesterol, 20-35 gm of total fiber daily;Understanding of distribution of calorie intake throughout the day with the consumption of 4-5 meals/snacks    Heart Failure Yes     Intervention Provide a combined exercise and nutrition program that is supplemented with education, support and counseling about heart failure. Directed toward relieving symptoms such as shortness of breath, decreased exercise tolerance, and extremity edema.    Expected Outcomes Improve functional capacity of life;Short term: Attendance in program 2-3 days a week with increased exercise capacity. Reported lower sodium intake. Reported increased fruit and vegetable intake. Reports medication compliance.;Short term: Daily weights obtained and reported for increase. Utilizing diuretic protocols set by physician.;Long term: Adoption of self-care skills and reduction of barriers for early signs and symptoms recognition and intervention leading to self-care maintenance.    Hypertension Yes    Intervention Provide education on lifestyle modifcations including regular physical activity/exercise, weight management, moderate sodium restriction and increased consumption of fresh fruit, vegetables, and low fat dairy, alcohol moderation, and smoking cessation.;Monitor prescription use compliance.    Expected Outcomes Long Term: Maintenance of blood pressure at goal levels.;Short Term: Continued assessment and intervention until BP is < 140/44m HG in hypertensive participants. < 130/849mHG in hypertensive participants with diabetes, heart failure or chronic kidney disease.    Lipids Yes    Intervention Provide education and support for participant on nutrition & aerobic/resistive exercise along with prescribed medications to achieve LDL '70mg'$ , HDL >'40mg'$ .    Expected Outcomes Short Term: Participant states understanding of desired cholesterol values and is compliant with medications prescribed. Participant is following exercise prescription and nutrition guidelines.;Long Term: Cholesterol controlled with medications as prescribed, with individualized exercise RX and with personalized nutrition plan. Value goals: LDL <  '70mg'$ , HDL > 40 mg.             Tobacco Use Initial Evaluation: Social History   Tobacco Use  Smoking Status Never   Passive exposure: Yes  Smokeless Tobacco Never  Tobacco Comments   Never smoke 10/21/21    Exercise Goals and Review:  Exercise Goals     Row Name 02/05/22 1220             Exercise Goals   Increase Physical Activity Yes       Intervention Provide advice, education, support and counseling about physical activity/exercise needs.;Develop an individualized exercise prescription for aerobic and resistive training based on initial evaluation findings, risk stratification, comorbidities and participant's personal goals.       Expected Outcomes Short Term: Attend rehab on a regular basis to increase amount of physical activity.;Long Term: Exercising regularly at least 3-5 days a week.;Long Term: Add in home exercise to make exercise part of routine and to increase amount of physical activity.       Increase Strength and Stamina Yes       Intervention Provide advice, education, support and counseling about physical activity/exercise needs.;Develop an individualized exercise prescription for aerobic and resistive training  based on initial evaluation findings, risk stratification, comorbidities and participant's personal goals.       Expected Outcomes Short Term: Increase workloads from initial exercise prescription for resistance, speed, and METs.;Short Term: Perform resistance training exercises routinely during rehab and add in resistance training at home;Long Term: Improve cardiorespiratory fitness, muscular endurance and strength as measured by increased METs and functional capacity (6MWT)       Able to understand and use rate of perceived exertion (RPE) scale Yes       Intervention Provide education and explanation on how to use RPE scale       Expected Outcomes Short Term: Able to use RPE daily in rehab to express subjective intensity level;Long Term:  Able to use RPE  to guide intensity level when exercising independently       Able to understand and use Dyspnea scale Yes       Intervention Provide education and explanation on how to use Dyspnea scale       Expected Outcomes Long Term: Able to use Dyspnea scale to guide intensity level when exercising independently;Short Term: Able to use Dyspnea scale daily in rehab to express subjective sense of shortness of breath during exertion       Knowledge and understanding of Target Heart Rate Range (THRR) Yes       Intervention Provide education and explanation of THRR including how the numbers were predicted and where they are located for reference       Expected Outcomes Long Term: Able to use THRR to govern intensity when exercising independently;Short Term: Able to state/look up THRR;Short Term: Able to use daily as guideline for intensity in rehab       Able to check pulse independently Yes       Intervention Provide education and demonstration on how to check pulse in carotid and radial arteries.;Review the importance of being able to check your own pulse for safety during independent exercise       Expected Outcomes Short Term: Able to explain why pulse checking is important during independent exercise;Long Term: Able to check pulse independently and accurately       Understanding of Exercise Prescription Yes       Intervention Provide education, explanation, and written materials on patient's individual exercise prescription       Expected Outcomes Short Term: Able to explain program exercise prescription;Long Term: Able to explain home exercise prescription to exercise independently                Copy of goals given to participant.

## 2022-02-05 NOTE — Progress Notes (Signed)
Cardiac Individual Treatment Plan  Patient Details  Name: Kyle Avila MRN: [id number] Date of Birth: [birth year] Referring Provider:   Flowsheet Row Cardiac Rehab from 02/05/2022 in [facility] Cardiac and Pulmonary Rehab  Referring Provider [name] MD       Initial Encounter Date:  Flowsheet Row Cardiac Rehab from 02/05/2022 in [facility] Cardiac and Pulmonary Rehab  Date 02/05/22       Visit Diagnosis: S/P PTCA (percutaneous transluminal coronary angioplasty)  Patient's Home Medications on Admission:  Current Outpatient Medications:    acetaminophen (TYLENOL) 500 MG tablet, Take 1,000 mg by mouth every 6 (six) hours as needed for moderate pain., Disp: , Rfl:    allopurinol (ZYLOPRIM) 100 MG tablet, Take 100 mg by mouth at bedtime., Disp: , Rfl:    amLODipine (NORVASC) 2.5 MG tablet, Take 3 tablets (7.5 mg total) by mouth daily. (Patient not taking: Reported on 01/29/2022), Disp: 90 tablet, Rfl: 2   amLODipine (NORVASC) 5 MG tablet, Take 5 mg by mouth daily., Disp: , Rfl:    apixaban (ELIQUIS) 5 MG TABS tablet, Take 1 tablet (5 mg total) by mouth 2 (two) times daily., Disp: 180 tablet, Rfl: 2   clopidogrel (PLAVIX) 75 MG tablet, Take 75 mg by mouth at bedtime., Disp: , Rfl:    fluticasone (FLONASE) 50 MCG/ACT nasal spray, Place 2 sprays into both nostrils daily as needed for allergies or rhinitis., Disp: 16 g, Rfl: 3   furosemide (LASIX) 80 MG tablet, Take 1 tablet by mouth each morning and 1/2 tablet daily in the afternoon until fluid is resolved, then decrease to 1 tablet daily thereafter, Disp: 90 tablet, Rfl: 3   isosorbide mononitrate (IMDUR) 120 MG 24 hr tablet, TAKE 1 TABLET (120 MG TOTAL) BY MOUTH DAILY., Disp: 90 tablet, Rfl: 3   ketoconazole (NIZORAL) 2 % cream, Apply 1 application. topically daily as needed (itching toes)., Disp: , Rfl:    losartan (COZAAR) 100 MG tablet, Take by mouth. (Patient not taking: Reported on 01/29/2022), Disp: , Rfl:    losartan (COZAAR) 50 MG  tablet, Take 1 tablet (50 mg total) by mouth daily., Disp: , Rfl:    metoprolol succinate (TOPROL-XL) 50 MG 24 hr tablet, TAKE 1 TABLET TWO TIMES DAILY. TAKE WITH OR IMMEDIATELY FOLLOWING MEALS., Disp: 180 tablet, Rfl: 3   nitroGLYCERIN (NITROSTAT) 0.4 MG SL tablet, PLACE 1 TABLET UNDER THE TONGUE EVERY 5 MINUTES AS NEEDED FOR CHEST PAIN, Disp: 25 tablet, Rfl: 3   pantoprazole (PROTONIX) 40 MG tablet, Take 40 mg by mouth daily. (Patient not taking: Reported on 01/27/2022), Disp: , Rfl:    predniSONE (DELTASONE) 20 MG tablet, Take 2 tablets (40 mg total) by mouth daily with breakfast., Disp: 90 tablet, Rfl: 2   ranolazine (RANEXA) 1000 MG SR tablet, Taking 1/2 tablet by mouth twice daily, Disp: , Rfl:    REPATHA SURECLICK 616 MG/ML SOAJ, Inject 140 mg into the skin every 14 (fourteen) days., Disp: 6 mL, Rfl: 3   sulfamethoxazole-trimethoprim (BACTRIM) 400-80 MG tablet, Take 1 tablet by mouth 3 (three) times a week. Take 1 tablet daily only on Monday, Wednesday, and Friday., Disp: 30 tablet, Rfl: 0   tamsulosin (FLOMAX) 0.4 MG CAPS, Take 0.4 mg by mouth daily., Disp: , Rfl:   Past Medical History: Past Medical History:  Diagnosis Date   Arthritis    "some in my back" (04/14/2017)   CAD (coronary artery disease)    a.  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; lupus attacked them" (04/14/2017)   Mitral insufficiency    Moderate mitral regurgitation 10/02/2021   Echocardiogram 6/23: EF 32, global HK, mild ASH, mildly reduced RVSF, normal PASP, RVSP 31, mod LAD, mild RAE, mod MR, trivial AI, AV sclerosis w/o stenosis   Myocardial infarction ([place])    "I've had some mild one" (04/14/2017)   Presence of permanent cardiac pacemaker    Skin cancer     "nose"   VT (ventricular tachycardia)-nonsustained 05/20/2011    Tobacco Use: Social History   Tobacco Use  Smoking Status Never   Passive exposure: Yes  Smokeless Tobacco Never  Tobacco Comments   Never smoke 10/21/21    Labs: Review Flowsheet  More data may exist      Latest Ref Rng & Units 10/26/2014 05/04/2015 02/12/2016 10/19/2019 10/28/2019  Labs for ITP Cardiac and Pulmonary Rehab  Cholestrol 100 - 199 mg/dL 163  159  159  90  133   LDL (calc) 0 - 99 mg/dL 92  84  87  NEG 2  52   Direct LDL 0 - 99 mg/dL - - - - 47   HDL-C >39 mg/dL 45.40  51  46  37  41   Trlycerides 0 - 149 mg/dL 126.0  118  130  275  254      Exercise Target Goals: Exercise Program Goal: Individual exercise prescription set using results from initial 6 min walk test and THRR while considering  patient's activity barriers and safety.   Exercise Prescription Goal: Initial exercise prescription builds to 30-45 minutes a day of aerobic activity, 2-3 days per week.  Home exercise guidelines will be given to patient during program as part of exercise prescription that the participant will acknowledge.   Education: Aerobic Exercise: - Group verbal and visual presentation on the components of exercise prescription. Introduces F.I.T.T principle from ACSM for exercise prescriptions.  Reviews F.I.T.T. principles of aerobic exercise including progression. Written material given at graduation.   Education: Resistance Exercise: - Group verbal and visual presentation on the components of exercise prescription. Introduces F.I.T.T principle from ACSM for exercise prescriptions  Reviews F.I.T.T. principles of resistance exercise including progression. Written material given at graduation.    Education: Exercise & Equipment Safety: - Individual verbal instruction and demonstration of equipment use and safety with use of the equipment. Flowsheet Row Cardiac Rehab from 02/05/2022 in [facility] Cardiac and Pulmonary Rehab  Date  01/29/22  Educator [facility]  Instruction Review Code 1- Verbalizes Understanding       Education: Exercise Physiology & General Exercise Guidelines: - Group verbal and written instruction with models to review the exercise physiology of the cardiovascular system and associated critical values. Provides general exercise guidelines with specific guidelines to those with heart or lung disease.    Education: Flexibility, Balance, Mind/Body Relaxation: - Group verbal and visual presentation with interactive activity on the components of exercise prescription. Introduces F.I.T.T principle from ACSM for exercise prescriptions. Reviews F.I.T.T. principles of flexibility and balance exercise training including progression. Also discusses the mind body connection.  Reviews various relaxation techniques to help reduce and manage stress (i.e. Deep breathing, progressive muscle relaxation, and visualization).  Balance handout provided to take home. Written material given at graduation.   Activity Barriers & Risk Stratification:  Activity Barriers & Cardiac Risk Stratification - 02/05/22 1217       Activity Barriers & Cardiac Risk Stratification   Activity Barriers Other (comment);Deconditioning;Muscular Weakness;Shortness of Breath;Decreased Ventricular Function;Balance Concerns;Chest Pain/Angina    Comments quick onset of fatigue (rushing especially)    Cardiac Risk Stratification High             6 Minute Walk:  6 Minute Walk     Row Name 02/05/22 1216         6 Minute Walk   Phase Initial     Distance 1110 feet     Walk Time 6 minutes     # of Rest Breaks 0     MPH 2.1     METS 2.02     RPE 13     Perceived Dyspnea  2     VO2 Peak 7.06     Symptoms Yes (comment)     Comments SOB, chest pain with arm pain 4/10     Resting HR 58 bpm     Resting BP 128/64     Resting Oxygen Saturation  98 %     Exercise Oxygen Saturation  during 6 min walk 98 %     Max Ex. HR 75 bpm     Max Ex. BP  128/60     2 Minute Post BP 124/60              Oxygen Initial Assessment:   Oxygen Re-Evaluation:   Oxygen Discharge (Final Oxygen Re-Evaluation):   Initial Exercise Prescription:  Initial Exercise Prescription - 02/05/22 1200       Date of Initial Exercise RX and Referring Provider   Date 02/05/22    Referring Provider [name] MD      Oxygen   Maintain Oxygen Saturation 88% or higher      Treadmill   MPH 1.8    Grade 0.5    Minutes 15    METs 2.5      Recumbant Bike   Level 1    RPM 50    Watts 10    Minutes 15    METs 2      REL-XR   Level 2    Speed 50    Minutes 15    METs 2      Track   Laps 27    Minutes 15    METs 2.47      Prescription Details   Frequency (times per week) 2    Duration Progress to 30 minutes of continuous aerobic without signs/symptoms of physical distress      Intensity   THRR 40-80% of Max Heartrate 92-126    Ratings of Perceived Exertion 11-13    Perceived Dyspnea 0-4      Progression   Progression Continue to progress workloads to maintain intensity without signs/symptoms of physical distress.      Resistance Training   Training Prescription Yes    Weight 4 lb    Reps 10-15             Perform Capillary Blood Glucose checks as needed.  Exercise Prescription Changes:   Exercise Prescription Changes     Row Name 02/05/22 1200             Response to Exercise   Blood Pressure (Admit) 128/64       Blood  Pressure (Exercise) 128/60       Blood Pressure (Exit) 124/60       Heart Rate (Admit) 58 bpm       Heart Rate (Exercise) 75 bpm       Heart Rate (Exit) 66 bpm       Oxygen Saturation (Admit) 98 %       Oxygen Saturation (Exercise) 98 %       Rating of Perceived Exertion (Exercise) 13       Perceived Dyspnea (Exercise) 2       Symptoms SOB, chest/arm pain 4/10       Comments walk test results                Exercise Comments:   Exercise Goals and Review:   Exercise Goals      Row Name 02/05/22 1220             Exercise Goals   Increase Physical Activity Yes       Intervention Provide advice, education, support and counseling about physical activity/exercise needs.;Develop an individualized exercise prescription for aerobic and resistive training based on initial evaluation findings, risk stratification, comorbidities and participant's personal goals.       Expected Outcomes Short Term: Attend rehab on a regular basis to increase amount of physical activity.;Long Term: Exercising regularly at least 3-5 days a week.;Long Term: Add in home exercise to make exercise part of routine and to increase amount of physical activity.       Increase Strength and Stamina Yes       Intervention Provide advice, education, support and counseling about physical activity/exercise needs.;Develop an individualized exercise prescription for aerobic and resistive training based on initial evaluation findings, risk stratification, comorbidities and participant's personal goals.       Expected Outcomes Short Term: Increase workloads from initial exercise prescription for resistance, speed, and METs.;Short Term: Perform resistance training exercises routinely during rehab and add in resistance training at home;Long Term: Improve cardiorespiratory fitness, muscular endurance and strength as measured by increased METs and functional capacity (6MWT)       Able to understand and use rate of perceived exertion (RPE) scale Yes       Intervention Provide education and explanation on how to use RPE scale       Expected Outcomes Short Term: Able to use RPE daily in rehab to express subjective intensity level;Long Term:  Able to use RPE to guide intensity level when exercising independently       Able to understand and use Dyspnea scale Yes       Intervention Provide education and explanation on how to use Dyspnea scale       Expected Outcomes Long Term: Able to use Dyspnea scale to guide intensity  level when exercising independently;Short Term: Able to use Dyspnea scale daily in rehab to express subjective sense of shortness of breath during exertion       Knowledge and understanding of Target Heart Rate Range (THRR) Yes       Intervention Provide education and explanation of THRR including how the numbers were predicted and where they are located for reference       Expected Outcomes Long Term: Able to use THRR to govern intensity when exercising independently;Short Term: Able to state/look up THRR;Short Term: Able to use daily as guideline for intensity in rehab       Able to check pulse independently Yes       Intervention Provide education and  demonstration on how to check pulse in carotid and radial arteries.;Review the importance of being able to check your own pulse for safety during independent exercise       Expected Outcomes Short Term: Able to explain why pulse checking is important during independent exercise;Long Term: Able to check pulse independently and accurately       Understanding of Exercise Prescription Yes       Intervention Provide education, explanation, and written materials on patient's individual exercise prescription       Expected Outcomes Short Term: Able to explain program exercise prescription;Long Term: Able to explain home exercise prescription to exercise independently                Exercise Goals Re-Evaluation :   Discharge Exercise Prescription (Final Exercise Prescription Changes):  Exercise Prescription Changes - 02/05/22 1200       Response to Exercise   Blood Pressure (Admit) 128/64    Blood Pressure (Exercise) 128/60    Blood Pressure (Exit) 124/60    Heart Rate (Admit) 58 bpm    Heart Rate (Exercise) 75 bpm    Heart Rate (Exit) 66 bpm    Oxygen Saturation (Admit) 98 %    Oxygen Saturation (Exercise) 98 %    Rating of Perceived Exertion (Exercise) 13    Perceived Dyspnea (Exercise) 2    Symptoms SOB, chest/arm pain 4/10    Comments  walk test results             Nutrition:  Target Goals: Understanding of nutrition guidelines, daily intake of sodium '1500mg'$ , cholesterol '200mg'$ , calories 30% from fat and 7% or less from saturated fats, daily to have 5 or more servings of fruits and vegetables.  Education: All About Nutrition: -Group instruction provided by verbal, written material, interactive activities, discussions, models, and posters to present general guidelines for heart healthy nutrition including fat, fiber, MyPlate, the role of sodium in heart healthy nutrition, utilization of the nutrition label, and utilization of this knowledge for meal planning. Follow up email sent as well. Written material given at graduation.   Biometrics:  Pre Biometrics - 02/05/22 1221       Pre Biometrics   Height '5\' 8"'$  (1.727 m)    Weight 172 lb 11.2 oz (78.3 kg)    Waist Circumference 38 inches    Hip Circumference 37.25 inches    Waist to Hip Ratio 1.02 %    BMI (Calculated) 26.26    Single Leg Stand 2.9 seconds              Nutrition Therapy Plan and Nutrition Goals:  Nutrition Therapy & Goals - 02/05/22 0859       Nutrition Therapy   Diet Heart healthy, low Na, CKD stg 4 MNT    Drug/Food Interactions Purine/Gout    Protein (specify units) 70-75g    Fiber 28 grams    Whole Grain Foods 3 servings   or less depending on his potassium   Saturated Fats 15 max. grams    Fruits and Vegetables 8 servings/day    Sodium 1.5 grams      Personal Nutrition Goals   Nutrition Goal ST: inclde, but limit high potassium/phosphorus containing foods. Review paperwork. identify high sources of sodium in the foods he eats regularly.  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    Comments 76 y.o. M admitted to cardiac rehab s/p PTCA. PMHx includes CAD, chronic  CHF, CKD stage 4, HTN, HLD, AFib, PVD, CHB with pacemaker, arthritis, gout, lupus,  MI (2018), skin cancer. PSHx includes CABG (1988, 1996), stent (2021). Relevant medications includes furosemide, prednisone, bactrim. Recent labs: Basic metabolic panel: BUN 76, creatinine 2.73, GFR 23, BUN/creatinine ratio ratio 28. Gail reports no gout flare-ups in decades, provided handout for his reference if he were to have a flare up again. He has been working with a neprhologist to manage his CKD stg 4 which he reports having since 1992. Discussed considerations for CKD including sodium, potassium, and phosphorus; his last blood result showed his potassium was on the high end of the normal range - recommended limiting high sources of potassium for the day, provided list. Bryden is nervous today as he has been having issues with edema and feels it began when he started the prednisone which his MD is tapering him off of - encouraged him to connect with his MD regarding this as the increased lasix has not been taking off his fluid. We discussed sodium recommendations, food label reading, and sources that are higher in sodium. He reports being hungrier than usual due to the prednisone so he has been having  B: bacon 3x/week, fruits (blueberries, bananas, and dates) with a fiber rich cereal like grape nuts and  2% milk . He will usually have some OJ as well as coffee. S: english muffin with PB and jelly L: home made tuna or chicken salad sandwich with lettuce D: Chicken, fish. Steamed vegetables ( he buys vegetables fresh). He does not usually have them with any carbohydrates aside from some rice. Drinks: sipping water during the day to help reduce swelling. He reports using greek seasoning as well as another seasoning blend. He uses olive oil and some salt from a grinder. He reports using whole grain bread most of the time. 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 He feels that his sodium may be coming from the seasoning blends so he will check those when he gets home; suggested that if he uses  it often, he could make his own seasoning blend without sodium - Bryceson reported that he would rather look for one lower in sodium.      Intervention Plan   Intervention Prescribe, educate and counsel regarding individualized specific dietary modifications aiming towards targeted core components such as weight, hypertension, lipid management, diabetes, heart failure and other comorbidities.    Expected Outcomes Short Term Goal: Understand basic principles of dietary content, such as calories, fat, sodium, cholesterol and nutrients.;Long Term Goal: Adherence to prescribed nutrition plan.;Short Term Goal: A plan has been developed with personal nutrition goals set during dietitian appointment.             Nutrition Assessments:  MEDIFICTS Score Key: ?70 Need to make dietary changes  40-70 Heart Healthy Diet ? 40 Therapeutic Level Cholesterol Diet  Flowsheet Row Cardiac Rehab from 02/05/2022 in [facility] Cardiac and Pulmonary Rehab  Picture Your Plate Total Score on Admission 72      Picture Your Plate Scores: <26 Unhealthy dietary pattern with much room for improvement. 41-50 Dietary pattern unlikely to meet recommendations for good health and room for improvement. 51-60 More healthful dietary pattern, with some room for improvement.  >60 Healthy dietary pattern, although there may be some specific behaviors that could be improved.    Nutrition Goals Re-Evaluation:   Nutrition Goals Discharge (Final Nutrition Goals Re-Evaluation):   Psychosocial: Target Goals: Acknowledge presence or absence of significant depression and/or  stress, maximize coping skills, provide positive support system. Participant is able to verbalize types and ability to use techniques and skills needed for reducing stress and depression.   Education: Stress, Anxiety, and Depression - Group verbal and visual presentation to define topics covered.  Reviews how body is impacted by stress, anxiety, and depression.   Also discusses healthy ways to reduce stress and to treat/manage anxiety and depression.  Written material given at graduation.   Education: Sleep Hygiene -Provides group verbal and written instruction about how sleep can affect your health.  Define sleep hygiene, discuss sleep cycles and impact of sleep habits. Review good sleep hygiene tips.    Initial Review & Psychosocial Screening:  Initial Psych Review & Screening - 01/29/22 1426       Initial Review   Current issues with None Identified      Family Dynamics   Good Support System? Yes    Comments He states that he does not have many people for support. He has a few kids and one that is close by.      Barriers   Psychosocial barriers to participate in program There are no identifiable barriers or psychosocial needs.;The patient should benefit from training in stress management and relaxation.      Screening Interventions   Interventions To provide support and resources with identified psychosocial needs;Encouraged to exercise;Provide feedback about the scores to participant    Expected Outcomes Short Term goal: Utilizing psychosocial counselor, staff and physician to assist with identification of specific Stressors or current issues interfering with healing process. Setting desired goal for each stressor or current issue identified.;Long Term Goal: Stressors or current issues are controlled or eliminated.;Short Term goal: Identification and review with participant of any Quality of Life or Depression concerns found by scoring the questionnaire.;Long Term goal: The participant improves quality of Life and PHQ9 Scores as seen by post scores and/or verbalization of changes             Quality of Life Scores:   Quality of Life - 02/05/22 1221       Quality of Life   Select Quality of Life      Quality of Life Scores   Health/Function Pre 14.37 %    Socioeconomic Pre 21.75 %    Psych/Spiritual Pre 21.21 %    Family Pre 24.3  %    GLOBAL Pre 18.14 %            Scores of 19 and below usually indicate a poorer quality of life in these areas.  A difference of  2-3 points is a clinically meaningful difference.  A difference of 2-3 points in the total score of the Quality of Life Index has been associated with significant improvement in overall quality of life, self-image, physical symptoms, and general health in studies assessing change in quality of life.  PHQ-9: Review Flowsheet       02/05/2022 08/03/2017 07/01/2017  Depression screen PHQ 2/9  Decreased Interest 0 0 0  Down, Depressed, Hopeless 0 0 0  PHQ - 2 Score 0 0 0  Altered sleeping 3 - -  Tired, decreased energy 3 - -  Change in appetite 0 - -  Feeling bad or failure about yourself  0 - -  Trouble concentrating 0 - -  Moving slowly or fidgety/restless 0 - -  Suicidal thoughts 0 - -  PHQ-9 Score 6 - -  Difficult doing work/chores Somewhat difficult - -   Interpretation of Total  Score  Total Score Depression Severity:  1-4 = Minimal depression, 5-9 = Mild depression, 10-14 = Moderate depression, 15-19 = Moderately severe depression, 20-27 = Severe depression   Psychosocial Evaluation and Intervention:  Psychosocial Evaluation - 01/29/22 1427       Psychosocial Evaluation & Interventions   Interventions Encouraged to exercise with the program and follow exercise prescription;Relaxation education;Stress management education    Comments He states that he does not have many people for support. He has a few kids and one that is close by.    Expected Outcomes Short: Start HeartTrack to help with mood. Long: Maintain a healthy mental state    Continue Psychosocial Services  Follow up required by staff             Psychosocial Re-Evaluation:   Psychosocial Discharge (Final Psychosocial Re-Evaluation):   Vocational Rehabilitation: Provide vocational rehab assistance to qualifying candidates.   Vocational Rehab Evaluation &  Intervention:   Education: Education Goals: Education classes will be provided on a variety of topics geared toward better understanding of heart health and risk factor modification. Participant will state understanding/return demonstration of topics presented as noted by education test scores.  Learning Barriers/Preferences:  Learning Barriers/Preferences - 01/29/22 1425       Learning Barriers/Preferences   Learning Barriers Sight    Learning Preferences Skilled Demonstration;Video             General Cardiac Education Topics:  AED/CPR: - Group verbal and written instruction with the use of models to demonstrate the basic use of the AED with the basic ABC's of resuscitation.   Anatomy and Cardiac Procedures: - Group verbal and visual presentation and models provide information about basic cardiac anatomy and function. Reviews the testing methods done to diagnose heart disease and the outcomes of the test results. Describes the treatment choices: Medical Management, Angioplasty, or Coronary Bypass Surgery for treating various heart conditions including Myocardial Infarction, Angina, Valve Disease, and Cardiac Arrhythmias.  Written material given at graduation. Flowsheet Row Cardiac Rehab from 02/05/2022 in [facility] Cardiac and Pulmonary Rehab  Education need identified 02/05/22       Medication Safety: - Group verbal and visual instruction to review commonly prescribed medications for heart and lung disease. Reviews the medication, class of the drug, and side effects. Includes the steps to properly store meds and maintain the prescription regimen.  Written material given at graduation.   Intimacy: - Group verbal instruction through game format to discuss how heart and lung disease can affect sexual intimacy. Written material given at graduation..   Know Your Numbers and Heart Failure: - Group verbal and visual instruction to discuss disease risk factors for cardiac and  pulmonary disease and treatment options.  Reviews associated critical values for Overweight/Obesity, Hypertension, Cholesterol, and Diabetes.  Discusses basics of heart failure: signs/symptoms and treatments.  Introduces Heart Failure Zone chart for action plan for heart failure.  Written material given at graduation.   Infection Prevention: - Provides verbal and written material to individual with discussion of infection control including proper hand washing and proper equipment cleaning during exercise session. Flowsheet Row Cardiac Rehab from 02/05/2022 in [facility] Cardiac and Pulmonary Rehab  Date 01/29/22  Educator [facility]  Instruction Review Code 1- Verbalizes Understanding       Falls Prevention: - Provides verbal and written material to individual with discussion of falls prevention and safety. Flowsheet Row Cardiac Rehab from 02/05/2022 in [facility] Cardiac and Pulmonary Rehab  Date 01/29/22  Educator [facility]  Instruction  Review Code 1- Verbalizes Understanding       Other: -Provides group and verbal instruction on various topics (see comments)   Knowledge Questionnaire Score:   Core Components/Risk Factors/Patient Goals at Admission:  Personal Goals and Risk Factors at Admission - 02/05/22 1224       Core Components/Risk Factors/Patient Goals on Admission    Weight Management Yes;Obesity    Intervention Weight Management: Develop a combined nutrition and exercise program designed to reach desired caloric intake, while maintaining appropriate intake of nutrient and fiber, sodium and fats, and appropriate energy expenditure required for the weight goal.;Weight Management: Provide education and appropriate resources to help participant work on and attain dietary goals.;Weight Management/Obesity: Establish reasonable short term and long term weight goals.;Obesity: Provide education and appropriate resources to help participant work on and attain dietary goals.    Admit Weight 172 lb 11.2 oz  (78.3 kg)    Goal Weight: Short Term 168 lb (76.2 kg)    Goal Weight: Long Term 160 lb (72.6 kg)    Expected Outcomes Short Term: Continue to assess and modify interventions until short term weight is achieved;Long Term: Adherence to nutrition and physical activity/exercise program aimed toward attainment of established weight goal;Weight Loss: Understanding of general recommendations for a balanced deficit meal plan, which promotes 1-2 lb weight loss per week and includes a negative energy balance of [phone] kcal/d;Understanding recommendations for meals to include 15-35% energy as protein, 25-35% energy from fat, 35-60% energy from carbohydrates, less than '200mg'$  of dietary cholesterol, 20-35 gm of total fiber daily;Understanding of distribution of calorie intake throughout the day with the consumption of 4-5 meals/snacks    Heart Failure Yes    Intervention Provide a combined exercise and nutrition program that is supplemented with education, support and counseling about heart failure. Directed toward relieving symptoms such as shortness of breath, decreased exercise tolerance, and extremity edema.    Expected Outcomes Improve functional capacity of life;Short term: Attendance in program 2-3 days a week with increased exercise capacity. Reported lower sodium intake. Reported increased fruit and vegetable intake. Reports medication compliance.;Short term: Daily weights obtained and reported for increase. Utilizing diuretic protocols set by physician.;Long term: Adoption of self-care skills and reduction of barriers for early signs and symptoms recognition and intervention leading to self-care maintenance.    Hypertension Yes    Intervention Provide education on lifestyle modifcations including regular physical activity/exercise, weight management, moderate sodium restriction and increased consumption of fresh fruit, vegetables, and low fat dairy, alcohol moderation, and smoking cessation.;Monitor  prescription use compliance.    Expected Outcomes Long Term: Maintenance of blood pressure at goal levels.;Short Term: Continued assessment and intervention until BP is < 140/83m HG in hypertensive participants. < 130/877mHG in hypertensive participants with diabetes, heart failure or chronic kidney disease.    Lipids Yes    Intervention Provide education and support for participant on nutrition & aerobic/resistive exercise along with prescribed medications to achieve LDL '70mg'$ , HDL >'40mg'$ .    Expected Outcomes Short Term: Participant states understanding of desired cholesterol values and is compliant with medications prescribed. Participant is following exercise prescription and nutrition guidelines.;Long Term: Cholesterol controlled with medications as prescribed, with individualized exercise RX and with personalized nutrition plan. Value goals: LDL < '70mg'$ , HDL > 40 mg.             Education:Diabetes - Individual verbal and written instruction to review signs/symptoms of diabetes, desired ranges of glucose level fasting, after meals and with exercise. Acknowledge that pre  and post exercise glucose checks will be done for 3 sessions at entry of program.   Core Components/Risk Factors/Patient Goals Review:    Core Components/Risk Factors/Patient Goals at Discharge (Final Review):    ITP Comments:  ITP Comments     Row Name 01/29/22 1422 02/05/22 1215         ITP Comments Virtual Visit completed. Patient informed on EP and RD appointment and 6 Minute walk test. Patient also informed of patient health questionnaires on My Chart. Patient Verbalizes understanding. Visit diagnosis can be found in [facility] 01/09/[facility]22. Completed 6MWT and gym orientation. Initial ITP created and sent for review to Dr. Emily Filbert, Medical Director.               Comments: Initial ITP

## 2022-02-05 NOTE — Telephone Encounter (Signed)
Today [facility] Told me today they will refill the sulfamethoxazole on the 15th.     PLEASE NOTE MY EDEMA  continues to worsen. It is noticeable worse than yesterday. Can I suggest I reduce the prednisone to 30 mg starting Thursday morning.. Thank you for your time and concern!    Dr. Silas Flood, please advise. thanks

## 2022-02-06 ENCOUNTER — Encounter: Payer: Medicare HMO | Admitting: *Deleted

## 2022-02-06 DIAGNOSIS — Z48812 Encounter for surgical aftercare following surgery on the circulatory system: Secondary | ICD-10-CM | POA: Diagnosis not present

## 2022-02-06 DIAGNOSIS — Z9861 Coronary angioplasty status: Secondary | ICD-10-CM | POA: Diagnosis not present

## 2022-02-06 NOTE — Telephone Encounter (Signed)
Pt to have a BMET on 02/17/22.

## 2022-02-06 NOTE — Telephone Encounter (Signed)
I spoke with the pt re: his My Chart messages... he says his weight has continue to trend upwards... a pound daily. Sept weight was 158-161 and Oct weights 165-168... today 169.    His breathing has been good since taking the Prednisone which he is now on 30 mg daily. His O2 Sat 97-99.   He can lay flat to sleep at night. Edema is still up to his shins bilaterally.   He has been taking the Lasix 80 mg daily and the extra PRN dose 40 mg in the afternoon for over 2 weeks.   He has been watching his NA intake.   His appt with Dr Marval Regal is not until December 2023... I urged him to call his office now to see of he can get a more urgent appt... he says he will call and I will send a note to his office.. I will follow up with the pt later today to be sure he was able to get an appt.   I will also forward to Dr Burt Knack for his review..    Last labs with Tommye Standard PA 01/27/22.Marland Kitchen recommendations was to have Nephrology help with his plan.

## 2022-02-06 NOTE — Progress Notes (Signed)
Daily Session Note  Patient Details  Name: Kyle Avila MRN: [id number] Date of Birth: [birth year] Referring Provider:   Flowsheet Row Cardiac Rehab from 02/05/2022 in [facility] Cardiac and Pulmonary Rehab  Referring Provider [name] MD       Encounter Date: 02/06/2022  Check In:  Session Check In - 02/06/22 0906       Check-In   Supervising physician immediately available to respond to emergencies See telemetry face sheet for immediately available ER MD    Location [facility]-Cardiac & Pulmonary Rehab    Staff Present Alberteen Sam, MA, RCEP, CCRP, CCET;Joseph [place], [place], RN, [place]    Virtual Visit No    Medication changes reported     No    Fall or balance concerns reported    No    Warm-up and Cool-down Performed on first and last piece of equipment    Resistance Training Performed Yes    VAD Patient? No    PAD/SET Patient? No      Pain Assessment   Currently in Pain? No/denies                Social History   Tobacco Use  Smoking Status Never   Passive exposure: Yes  Smokeless Tobacco Never  Tobacco Comments   Never smoke 10/21/21    Goals Met:  Independence with exercise equipment Exercise tolerated well No report of concerns or symptoms today Strength training completed today  Goals Unmet:  Not Applicable  Comments: First full day of exercise!  Patient was oriented to gym and equipment including functions, settings, policies, and procedures.  Patient's individual exercise prescription and treatment plan were reviewed.  All starting workloads were established based on the results of the 6 minute walk test done at initial orientation visit.  The plan for exercise progression was also introduced and progression will be customized based on patient's performance and goals.    Dr. Emily Filbert is Medical Director for [place] [facility].  Dr. Ottie Glazier is Medical Director for [facility] Pulmonary Rehabilitation.

## 2022-02-11 ENCOUNTER — Encounter: Payer: Medicare HMO | Admitting: *Deleted

## 2022-02-11 DIAGNOSIS — Z9861 Coronary angioplasty status: Secondary | ICD-10-CM

## 2022-02-11 DIAGNOSIS — Z48812 Encounter for surgical aftercare following surgery on the circulatory system: Secondary | ICD-10-CM | POA: Diagnosis not present

## 2022-02-11 NOTE — Progress Notes (Signed)
Daily Session Note  Patient Details  Name: Kyle Avila MRN: [id number] Date of Birth: 06/02/1945 Referring Provider:   Flowsheet Row Cardiac Rehab from 02/05/2022 in [facility] Cardiac and Pulmonary Rehab  Referring Provider Cooper, Michael MD       Encounter Date: 02/11/2022  Check In:  Session Check In - 02/11/22 0821       Check-In   Supervising physician immediately available to respond to emergencies See telemetry face sheet for immediately available ER MD    Location [facility]-Cardiac & Pulmonary Rehab    Staff Present Susanne Bice, RN, BSN, CCRP;Jessica Hawkins, MA, RCEP, CCRP, CCET;Kara Langdon, MS, ASCM CEP, Exercise Physiologist    Virtual Visit No    Medication changes reported     No    Fall or balance concerns reported    No    Warm-up and Cool-down Performed on first and last piece of equipment    Resistance Training Performed Yes    VAD Patient? No    PAD/SET Patient? No      Pain Assessment   Currently in Pain? No/denies                Social History   Tobacco Use  Smoking Status Never   Passive exposure: Yes  Smokeless Tobacco Never  Tobacco Comments   Never smoke 10/21/21    Goals Met:  Independence with exercise equipment Exercise tolerated well No report of concerns or symptoms today  Goals Unmet:  Not Applicable  Comments: Pt able to follow exercise prescription today without complaint.  Will continue to monitor for progression.    Dr. Mark Miller is Medical Director for [facility].  Dr. Fuad Aleskerov is Medical Director for [facility] Pulmonary Rehabilitation. 

## 2022-02-13 ENCOUNTER — Encounter: Payer: Medicare HMO | Admitting: *Deleted

## 2022-02-13 ENCOUNTER — Encounter: Payer: Self-pay | Admitting: Cardiovascular Disease

## 2022-02-13 DIAGNOSIS — Z9861 Coronary angioplasty status: Secondary | ICD-10-CM

## 2022-02-13 DIAGNOSIS — Z48812 Encounter for surgical aftercare following surgery on the circulatory system: Secondary | ICD-10-CM | POA: Diagnosis not present

## 2022-02-13 DIAGNOSIS — L814 Other melanin hyperpigmentation: Secondary | ICD-10-CM | POA: Diagnosis not present

## 2022-02-13 DIAGNOSIS — L0109 Other impetigo: Secondary | ICD-10-CM | POA: Diagnosis not present

## 2022-02-13 DIAGNOSIS — D1801 Hemangioma of skin and subcutaneous tissue: Secondary | ICD-10-CM | POA: Diagnosis not present

## 2022-02-13 DIAGNOSIS — Z85828 Personal history of other malignant neoplasm of skin: Secondary | ICD-10-CM | POA: Diagnosis not present

## 2022-02-13 DIAGNOSIS — L0889 Other specified local infections of the skin and subcutaneous tissue: Secondary | ICD-10-CM | POA: Diagnosis not present

## 2022-02-13 DIAGNOSIS — L821 Other seborrheic keratosis: Secondary | ICD-10-CM | POA: Diagnosis not present

## 2022-02-13 DIAGNOSIS — D692 Other nonthrombocytopenic purpura: Secondary | ICD-10-CM | POA: Diagnosis not present

## 2022-02-13 NOTE — Progress Notes (Signed)
Daily Session Note  Patient Details  Name: Kyle Avila MRN: [id number] Date of Birth: 02-Dec-1945 Referring Provider:   Flowsheet Row Cardiac Rehab from 02/05/2022 in [facility] Cardiac and Pulmonary Rehab  Referring Provider [name] MD       Encounter Date: 02/13/2022  Check In:  Session Check In - 02/13/22 0841       Check-In   Supervising physician immediately available to respond to emergencies See telemetry face sheet for immediately available ER MD    Location [facility]-Cardiac & Pulmonary Rehab    Staff Present Heath Lark, RN, BSN, CCRP;Jessica [place], MA, RCEP, CCRP, Marylynn Pearson, MS, ASCM CEP, Exercise Physiologist;Joseph [place], [place]    Virtual Visit No    Medication changes reported     No    Fall or balance concerns reported    No    Warm-up and Cool-down Performed on first and last piece of equipment    Resistance Training Performed Yes    VAD Patient? No    PAD/SET Patient? No      Pain Assessment   Currently in Pain? No/denies                Social History   Tobacco Use  Smoking Status Never   Passive exposure: Yes  Smokeless Tobacco Never  Tobacco Comments   Never smoke 10/21/21    Goals Met:  Independence with exercise equipment Exercise tolerated well No report of concerns or symptoms today  Goals Unmet:  Not Applicable  Comments: Pt able to follow exercise prescription today without complaint.  Will continue to monitor for progression.    Dr. Emily Filbert is Medical Director for [place].  Dr. Ottie Glazier is Medical Director for [facility] Pulmonary Rehabilitation.

## 2022-02-17 ENCOUNTER — Ambulatory Visit: Payer: Medicare HMO | Attending: Cardiovascular Disease

## 2022-02-17 DIAGNOSIS — Z79899 Other long term (current) drug therapy: Secondary | ICD-10-CM | POA: Diagnosis not present

## 2022-02-17 DIAGNOSIS — I5022 Chronic systolic (congestive) heart failure: Secondary | ICD-10-CM

## 2022-02-17 LAB — BASIC METABOLIC PANEL
BUN/Creatinine Ratio: 25 — ABNORMAL HIGH (ref 10–24)
BUN: 83 mg/dL (ref 8–27)
CO2: 26 mmol/L (ref 20–29)
Calcium: 8.9 mg/dL (ref 8.6–10.2)
Chloride: 101 mmol/L (ref 96–106)
Creatinine, Ser: 3.31 mg/dL — ABNORMAL HIGH (ref 0.76–1.27)
Glucose: 150 mg/dL — ABNORMAL HIGH (ref 70–99)
Potassium: 4.7 mmol/L (ref 3.5–5.2)
Sodium: 139 mmol/L (ref 134–144)
eGFR: 18 mL/min/{1.73_m2} — ABNORMAL LOW (ref >59)

## 2022-02-18 ENCOUNTER — Encounter: Payer: Medicare HMO | Admitting: *Deleted

## 2022-02-18 DIAGNOSIS — Z9861 Coronary angioplasty status: Secondary | ICD-10-CM

## 2022-02-18 DIAGNOSIS — Z48812 Encounter for surgical aftercare following surgery on the circulatory system: Secondary | ICD-10-CM | POA: Diagnosis not present

## 2022-02-18 NOTE — Progress Notes (Signed)
Daily Session Note  Patient Details  Name: Kyle Avila MRN: [id number] Date of Birth: [birth year] Referring Provider:   Flowsheet Row Cardiac Rehab from 02/05/2022 in [facility] Cardiac and Pulmonary Rehab  Referring Provider [name] MD       Encounter Date: 02/18/2022  Check In:  Session Check In - 02/18/22 0801       Check-In   Supervising physician immediately available to respond to emergencies See telemetry face sheet for immediately available ER MD    Location [facility]-Cardiac & Pulmonary Rehab    Staff Present Heath Lark, RN, BSN, CCRP;Jessica [place], MA, RCEP, CCRP, Marylynn Pearson, MS, ASCM CEP, Exercise Physiologist    Virtual Visit No    Medication changes reported     No    Fall or balance concerns reported    No    Warm-up and Cool-down Performed on first and last piece of equipment    Resistance Training Performed Yes    VAD Patient? No    PAD/SET Patient? No      Pain Assessment   Currently in Pain? No/denies            [address] in yard while doing yardwork, few scratches,no other injury    Social History   Tobacco Use  Smoking Status Never   Passive exposure: Yes  Smokeless Tobacco Never  Tobacco Comments   Never smoke 10/21/21    Goals Met:  Independence with exercise equipment Exercise tolerated well No report of concerns or symptoms today  Goals Unmet:  Not Applicable  Comments: Pt able to follow exercise prescription today without complaint.  Will continue to monitor for progression.    Dr. Emily Filbert is Medical Director for [facility].  Dr. Ottie Glazier is Medical Director for [facility] Pulmonary Rehabilitation.

## 2022-02-19 ENCOUNTER — Telehealth: Payer: Self-pay

## 2022-02-19 ENCOUNTER — Encounter: Payer: Self-pay | Admitting: Pulmonary Disease

## 2022-02-19 ENCOUNTER — Encounter: Payer: Self-pay | Admitting: Cardiovascular Disease

## 2022-02-19 MED ORDER — FUROSEMIDE 80 MG PO TABS: 80.0000 mg | ORAL_TABLET | Freq: Every day | ORAL | 3 refills | Status: DC

## 2022-02-19 NOTE — Telephone Encounter (Signed)
Per Dr Olin Pia request - spoke with pt and scheduled appointment for telephone visit with Dr Caryl Comes for 03/23/2022 at 1120am.  Pt verbalizes understanding and agrees with current plan.

## 2022-02-19 NOTE — Telephone Encounter (Signed)
-----   Message from [name], MD sent at 02/18/2022 10:17 AM EDT ----- Worsening of patient's baseline renal dysfunction noted, creatinine now 3.3 with BUN up to 83.  Please have the patient hold losartan and reduce furosemide to 80 mg once daily.  I will review his medications further at his upcoming follow-up visit.  We will likely refer him to the advanced heart failure clinic.

## 2022-02-19 NOTE — Telephone Encounter (Signed)
Called and spoke with patient who verbalized understanding via read-back to stop Losartan entirely, and he will decrease his lasix to 1 tablet in the morning only ('80mg'$  tabs). He already has upcoming appt

## 2022-02-19 NOTE — Telephone Encounter (Signed)
Per OV note by Tommye Standard, PA on 01/27/22:  5. 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 Not sure if he is a good ablation candidate with his acute pulmonary issues, though perhaps could consider referral to one of the EP MDs if Dr. KCaryl Comeswould like  Will route to Dr KCaryl Comesfor review as patient is now inquiring about this.

## 2022-02-20 ENCOUNTER — Encounter: Payer: Self-pay | Admitting: Internal Medicine

## 2022-02-20 ENCOUNTER — Ambulatory Visit: Payer: Medicare HMO | Attending: Internal Medicine | Admitting: Internal Medicine

## 2022-02-20 ENCOUNTER — Encounter: Payer: Medicare HMO | Admitting: *Deleted

## 2022-02-20 VITALS — Ht 68.0 in | Wt 168.0 lb

## 2022-02-20 DIAGNOSIS — I255 Ischemic cardiomyopathy: Secondary | ICD-10-CM

## 2022-02-20 DIAGNOSIS — R0600 Dyspnea, unspecified: Secondary | ICD-10-CM

## 2022-02-20 DIAGNOSIS — I495 Sick sinus syndrome: Secondary | ICD-10-CM | POA: Diagnosis not present

## 2022-02-20 DIAGNOSIS — Z48812 Encounter for surgical aftercare following surgery on the circulatory system: Secondary | ICD-10-CM | POA: Diagnosis not present

## 2022-02-20 DIAGNOSIS — I5022 Chronic systolic (congestive) heart failure: Secondary | ICD-10-CM

## 2022-02-20 DIAGNOSIS — Z950 Presence of cardiac pacemaker: Secondary | ICD-10-CM

## 2022-02-20 DIAGNOSIS — I4819 Other persistent atrial fibrillation: Secondary | ICD-10-CM | POA: Diagnosis not present

## 2022-02-20 DIAGNOSIS — Z9861 Coronary angioplasty status: Secondary | ICD-10-CM | POA: Diagnosis not present

## 2022-02-20 NOTE — Progress Notes (Signed)
Electrophysiology TeleHealth Note      Date:  02/20/2022   ID:  Kyle Avila 04/29/1945, MRN [id number]  Location: patient's home  Provider location: [address] [place]  Evaluation Performed: Follow-up visit  PCP:  [name], MD  Cardiologist:   [facility] Electrophysiologist:  SK   Chief Complaint:  shortness of breath   History of Present Illness:    Kyle Avila is a 76 y.o. male who presents via audio/video conferencing for a telehealth visit today.  Since last being seen in our clinic for shortness of breath and exercise intolerance the patient reports being unable to do anything without really significant fatigue.  Notably he has a history of atrial fibrillation.  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  CT scan was concerning, amiodarone was discontinued and steroids were initiated.  He has been in sinus rhythm since May  Sometime between February and May his rate response algorithm was inactivated, not as relevant in the setting of atrial fibrillation which would have been associated with mode switch to a rate responsive mode but reviewing his device reports he is being DDD 60 with no heart rate excursion.  With approximately 96% atrial pacing at 60 bpm  LV function assessments over the last couple of years have been stable at 40-45% although there has been a change in the MR from mild--moderate        The patient denies symptoms of fevers, chills, cough, or new SOB worrisome for COVID 19.    Past Medical History:  Diagnosis Date   Arthritis    "some in my back" (04/14/2017)   CAD (coronary artery disease)    a.  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; lupus attacked them" (04/14/2017)   Mitral insufficiency    Moderate mitral regurgitation 10/02/2021   Echocardiogram 6/23: EF 21, global HK, mild ASH, mildly reduced RVSF, normal PASP, RVSP 31, mod LAD, mild RAE, mod MR, trivial AI, AV sclerosis w/o stenosis   Myocardial infarction ([place])    "I've had some mild one" (04/14/2017)   Presence of permanent cardiac pacemaker    Skin cancer    "nose"   VT (ventricular tachycardia)-nonsustained 05/20/2011    Past Surgical History:  Procedure Laterality Date   ACHILLES TENDON REPAIR Right    CARDIAC CATHETERIZATION     CARDIAC CATHETERIZATION N/A 11/03/2014   Procedure: Left Heart Cath and Cors/Grafts Angiography;  Surgeon: [name], MD;  Location: [facility];  Service: Cardiovascular;  Laterality: N/A;   CARDIAC CATHETERIZATION N/A 11/03/2014   Procedure: Coronary Stent Intervention;  Surgeon: [name], MD;  Location: [place] [facility];  Service: Cardiovascular;  Laterality: N/A;   CARDIOVERSION N/A 07/17/2021   Procedure: CARDIOVERSION;  Surgeon: [name], MD;  Location: [place];  Service: Cardiovascular;  Laterality: N/A;   CARDIOVERSION N/A 10/08/2021   Procedure: CARDIOVERSION;  Surgeon: Freada Bergeron, MD;  Location: [place];  Service: Cardiovascular;  Laterality: N/A;   CAROTID ENDARTERECTOMY  2002   "? side"   CATARACT EXTRACTION W/  INTRAOCULAR LENS IMPLANT     "? side"   CORONARY ANGIOPLASTY WITH STENT PLACEMENT  04/14/2017   CORONARY ANGIOPLASTY WITH STENT PLACEMENT  11/03/2014   SVG PVA   CORONARY ARTERY BYPASS GRAFT     with redo cabg   CORONARY BALLOON ANGIOPLASTY N/A 08/15/2021   Procedure: CORONARY BALLOON ANGIOPLASTY;  Surgeon: Burnell Blanks, MD;  Location: [facility];  Service: Cardiovascular;  Laterality: N/A;   CORONARY STENT INTERVENTION N/A 10/18/2019   Procedure: CORONARY STENT INTERVENTION;  Surgeon: [name], MD;  Location: [place] [facility];  Service: Cardiovascular;  Laterality: N/A;   CORONARY/GRAFT ANGIOGRAPHY N/A 10/18/2019   Procedure: CORONARY/GRAFT ANGIOGRAPHY;  Surgeon: [name], MD;  Location: [place] [facility];  Service: Cardiovascular;  Laterality: N/A;   EP IMPLANTABLE DEVICE N/A 09/13/2014   Procedure: PPM Generator Changeout;  Surgeon: Deboraha Sprang, MD;  Location: [place];  Service: Cardiovascular;  Laterality: N/A;   LEFT HEART CATH AND CORS/GRAFTS ANGIOGRAPHY N/A 04/14/2017   Procedure: LEFT HEART CATH AND CORS/GRAFTS ANGIOGRAPHY;  Surgeon: [name], MD;  Location: [place] [facility];  Service: Cardiovascular;  Laterality: N/A;   LEFT HEART CATH AND CORS/GRAFTS ANGIOGRAPHY N/A 08/15/2021   Procedure: LEFT HEART CATH AND CORS/GRAFTS ANGIOGRAPHY;  Surgeon: Burnell Blanks, MD;  Location: [place];  Service: Cardiovascular;  Laterality: N/A;   MOHS SURGERY     "just outside my nose"   PACEMAKER INSERTION  2007   TONSILLECTOMY  1958    Current Outpatient Medications  Medication Sig Dispense Refill   acetaminophen (TYLENOL) 500 MG tablet Take 1,000 mg by mouth every 6 (six) hours as needed for moderate pain.     allopurinol (ZYLOPRIM) 100 MG tablet Take 100 mg by mouth at bedtime.     amLODipine (NORVASC) 2.5 MG tablet Take 3 tablets (7.5 mg total) by mouth daily. 90 tablet 2   apixaban (ELIQUIS) 5 MG TABS tablet Take 1 tablet (5 mg total) by mouth 2 (two) times daily. 180 tablet 2   clopidogrel (PLAVIX) 75 MG tablet Take 75 mg by mouth at bedtime.     fluticasone (FLONASE) 50 MCG/ACT nasal spray Place 2 sprays into both nostrils daily as needed for allergies or rhinitis. 16 g 3   furosemide (LASIX) 80 MG tablet Take 1 tablet (80 mg total) by mouth daily. 90 tablet 3   isosorbide mononitrate (IMDUR) 120 MG 24 hr tablet TAKE 1 TABLET (120 MG TOTAL) BY MOUTH DAILY. 90 tablet 3   ketoconazole (NIZORAL) 2 % cream Apply 1 application. topically daily as needed  (itching toes).     metoprolol succinate (TOPROL-XL) 50 MG 24 hr tablet TAKE 1 TABLET TWO TIMES DAILY. TAKE WITH OR IMMEDIATELY FOLLOWING MEALS. 180 tablet 3   mupirocin ointment (BACTROBAN) 2 % Apply 1 Application topically 3 (three) times daily.     nitroGLYCERIN (NITROSTAT) 0.4 MG SL tablet PLACE 1 TABLET UNDER THE TONGUE EVERY 5 MINUTES AS NEEDED FOR CHEST PAIN 25 tablet 3   pantoprazole (PROTONIX) 40 MG tablet Take 40 mg by mouth daily.     predniSONE (DELTASONE) 20 MG tablet Take 2 tablets (40 mg total) by mouth daily with breakfast. 90 tablet 2   ranolazine (RANEXA) 1000 MG SR tablet Taking 1/2 tablet by mouth twice daily     REPATHA SURECLICK 951 MG/ML SOAJ Inject 140 mg into the skin every 14 (fourteen) days. 6 mL 3   sulfamethoxazole-trimethoprim (BACTRIM) 400-80 MG tablet  Take 1 tablet by mouth 3 (three) times a week. Take 1 tablet daily only on Monday, Wednesday, and Friday. 30 tablet 0   tamsulosin (FLOMAX) 0.4 MG CAPS Take 0.4 mg by mouth daily.     triamcinolone ointment (KENALOG) 0.1 % Apply 1 Application topically 2 (two) times daily.     No current facility-administered medications for this visit.    Allergies:   Amiodarone   Social History:  The patient  reports that he has never smoked. He has been exposed to tobacco smoke. He has never used smokeless tobacco. He reports current alcohol use of about 3.0 standard drinks of alcohol per week. He reports that he does not use drugs.   Family History:  [name]   family history includes Cancer in his mother; Heart attack in his father; Heart disease in his father.   ROS:  Please see the history of present illness.   All other systems are personally reviewed and negative.    Exam:    Vital Signs:  Ht '5\' 8"'$  (1.727 m)   Wt 168 lb (76.2 kg)   BMI 25.54 kg/m     Well appearing, alert and conversant, regular work of breathing,  good skin color Eyes- anicteric, neuro- grossly intact, skin- no apparent rash or lesions or  cyanosis, mouth- oral mucosa is pink Question cushingoid   Labs/Other Tests and Data Reviewed:    Recent Labs: 01/27/2022: Hemoglobin 12.8; Platelets 138 02/17/2022: BUN 83; Creatinine, Ser 3.31; Potassium 4.7; Sodium 139   Wt Readings from Last 3 Encounters:  02/20/22 168 lb (76.2 kg)  02/05/22 172 lb 11.2 oz (78.3 kg)  01/27/22 168 lb (76.2 kg)     Other studies personally reviewed: Additional studies/ records that were reviewed today include: (As above)   Review of the above records today demonstrates: (As above)  Prior radiographs:      ASSESSMENT & PLAN:    Ischemic cardiomyopathy with prior MI   Hypertension    Pacemaker-[employer]     Complete heart block device dependent   PVCs   Sinus node dysfunction  Atrial fibrillation-persistent  Question amiodarone lung toxicity  Mitral regurgitation-mild-moderate    The patient has significant exercise limitations over the last number of months.  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  I have reached out to Dr. Silas Flood to see whether in light of the above observation regarding his pacemaker program whether this would cause him to reassess the diagnosis and whether perhaps more rapid taper would be appropriate.  In this regard it is notable also that his amiodarone exposure was really very brief  The patient is to come in to see Dr. Burt Knack tomorrow, I have reached out to Dr. Burt Knack and of the pacemaker clinic to reactivate his rate response; I would anticipate, perhaps optimistically, that this should be associated with significant improvement in exercise tolerance, perhaps even immediately.  A further  device intervention could include CRT; although his EF is stable over these recent years his MR is somewhat worse and it might bely worsening ejection fraction.  I will defer this Discussion to Dr Edd Fabian  Follow-up:  3 mo    Current medicines are reviewed  at length with the patient today.   The patient  concerns regarding his medicines.  The following changes were made today:  pacemaker reprogramming with Dr [facility] visit tomorrow   Labs/ tests ordered today include:  No orders of the defined types were placed in this encounter.     Today, I have spent 21 minutes with the patient with telehealth technology discussing the above.  Signed, [name], MD  02/20/2022 12:14 PM     [address] [place] [address] [phone] (office) [phone] (fax)

## 2022-02-20 NOTE — Progress Notes (Deleted)
Daily Session Note  Patient Details  Name: Kyle Avila MRN: [id number] Date of Birth: 01/28/1946 Referring Provider:   Flowsheet Row Cardiac Rehab from 02/05/2022 in [facility] Cardiac and Pulmonary Rehab  Referring Provider [name] MD       Encounter Date: 02/20/2022  Check In:  Session Check In - 02/20/22 0820       Check-In   Supervising physician immediately available to respond to emergencies See telemetry face sheet for immediately available ER MD    Location [facility]-Cardiac & Pulmonary Rehab    Staff Present Justin Mend, Ernestina Patches, RN, Wilhelmenia Blase, MS, ASCM CEP, Exercise Physiologist    Virtual Visit No    Medication changes reported     No    Fall or balance concerns reported    No    Warm-up and Cool-down Performed on first and last piece of equipment    Resistance Training Performed Yes    VAD Patient? No    PAD/SET Patient? No      Pain Assessment   Currently in Pain? No/denies                Social History   Tobacco Use  Smoking Status Never   Passive exposure: Yes  Smokeless Tobacco Never  Tobacco Comments   Never smoke 10/21/21    Goals Met:  Independence with exercise equipment Exercise tolerated well No report of concerns or symptoms today Strength training completed today  Goals Unmet:  Not Applicable  Comments: Pt able to follow exercise prescription today without complaint.  Will continue to monitor for progression.    Dr. Emily Filbert is Medical Director for [facility].  Dr. Ottie Glazier is Medical Director for [facility] Pulmonary Rehabilitation.

## 2022-02-20 NOTE — Progress Notes (Signed)
Daily Session Note  Patient Details  Name: Kyle Avila MRN: [id number] Date of Birth: 1946/04/20 Referring Provider:   Flowsheet Row Cardiac Rehab from 02/05/2022 in [facility] Cardiac and Pulmonary Rehab  Referring Provider [name] MD       Encounter Date: 02/20/2022  Check In:  Session Check In - 02/20/22 0820       Check-In   Supervising physician immediately available to respond to emergencies See telemetry face sheet for immediately available ER MD    Location [facility]-Card[facility] & Pulmonary [facility]    Staff Present Justin Mend, Ernestina Patches, RN, Wilhelmenia Blase, MS, ASCM CEP, Exercise Physiologist    Virtual Visit No    Medication changes reported     Yes    Comments Hold Losartan / Decr Lasix 80 mg daily    Fall or balance concerns reported    No    Warm-up and Cool-down Performed on first and last piece of equipment    Resistance Training Performed Yes    VAD Patient? No    PAD/SET Patient? No      Pain Assessment   Currently in Pain? No/denies                Social History   Tobacco Use  Smoking Status Never   Passive exposure: Yes  Smokeless Tobacco Never  Tobacco Comments   Never smoke 10/21/21    Goals Met:  Independence with exercise equipment Exercise tolerated well No report of concerns or symptoms today Strength training completed today  Goals Unmet:  Not Applicable  Comments: Pt able to follow exercise prescription today without complaint.  Will continue to monitor for progression.    Dr. Emily Filbert is Medical Director for [facility].  Dr. Ottie Glazier is Medical Director for [facility] Pulmonary Rehabilitation.

## 2022-02-21 ENCOUNTER — Encounter: Payer: Self-pay | Admitting: Cardiovascular Disease

## 2022-02-21 ENCOUNTER — Ambulatory Visit: Payer: Medicare HMO | Attending: Cardiovascular Disease | Admitting: Cardiovascular Disease

## 2022-02-21 ENCOUNTER — Ambulatory Visit (INDEPENDENT_AMBULATORY_CARE_PROVIDER_SITE_OTHER): Payer: Medicare HMO

## 2022-02-21 VITALS — BP 124/74 | HR 62 | Ht 68.0 in | Wt 172.0 lb

## 2022-02-21 DIAGNOSIS — J984 Other disorders of lung: Secondary | ICD-10-CM | POA: Diagnosis not present

## 2022-02-21 DIAGNOSIS — I5022 Chronic systolic (congestive) heart failure: Secondary | ICD-10-CM

## 2022-02-21 DIAGNOSIS — T462X5A Adverse effect of other antidysrhythmic drugs, initial encounter: Secondary | ICD-10-CM

## 2022-02-21 DIAGNOSIS — I10 Essential (primary) hypertension: Secondary | ICD-10-CM

## 2022-02-21 DIAGNOSIS — I4819 Other persistent atrial fibrillation: Secondary | ICD-10-CM | POA: Diagnosis not present

## 2022-02-21 DIAGNOSIS — E782 Mixed hyperlipidemia: Secondary | ICD-10-CM

## 2022-02-21 DIAGNOSIS — I25119 Atherosclerotic heart disease of native coronary artery with unspecified angina pectoris: Secondary | ICD-10-CM | POA: Diagnosis not present

## 2022-02-21 DIAGNOSIS — I255 Ischemic cardiomyopathy: Secondary | ICD-10-CM

## 2022-02-21 LAB — CUP PACEART INCLINIC DEVICE CHECK
Battery Impedance: 1033 Ohm
Battery Remaining Longevity: 53 mo
Battery Voltage: 2.77 V
Brady Statistic AP VP Percent: 76 %
Brady Statistic AP VS Percent: 0 %
Brady Statistic AS VP Percent: 20 %
Brady Statistic AS VS Percent: 4 %
Date Time Interrogation Session: 20231027160442
Implantable Lead Connection Status: 753985
Implantable Lead Connection Status: 753985
Implantable Lead Implant Date: 20070831
Implantable Lead Implant Date: 20070831
Implantable Lead Location: 753859
Implantable Lead Location: 753860
Implantable Lead Model: 4469
Implantable Lead Model: 4470
Implantable Lead Serial Number: 485949
Implantable Lead Serial Number: 553665
Implantable Pulse Generator Implant Date: 20160518
Lead Channel Impedance Value: 437 Ohm
Lead Channel Impedance Value: 493 Ohm
Lead Channel Pacing Threshold Amplitude: 0.625 V
Lead Channel Pacing Threshold Amplitude: 0.75 V
Lead Channel Pacing Threshold Pulse Width: 0.4 ms
Lead Channel Pacing Threshold Pulse Width: 0.4 ms
Lead Channel Setting Pacing Amplitude: 2 V
Lead Channel Setting Pacing Amplitude: 2.5 V
Lead Channel Setting Pacing Pulse Width: 0.4 ms
Lead Channel Setting Sensing Sensitivity: 4 mV
Zone Setting Status: 755011
Zone Setting Status: 755011

## 2022-02-21 MED ORDER — AMLODIPINE BESYLATE 2.5 MG PO TABS: 5.0000 mg | ORAL_TABLET | Freq: Every day | ORAL | 3 refills | Status: DC

## 2022-02-21 NOTE — Progress Notes (Signed)
Cardiology Office Note:    Date:  02/21/2022   ID:  Kyle Avila, DOB [birth year], MRN [id number]  PCP:  [name], [place] Providers Cardiologist:  [name], MD Electrophysiologist:  [name], MD     Referring MD: [name], MD   Chief Complaint  Patient presents with   Shortness of Breath    History of Present Illness:    Kyle Avila is a 76 y.o. 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  He has been having a difficult time this year with more problems related to congestive heart failure.  He also has had atrial fibrillation underneath his ventricular pacing.  He has undergone cardioversion and was started on amiodarone with maintenance of sinus rhythm.  However, he developed concerning symptoms and signs for amiodarone lung toxicity.  About 1 month ago amiodarone was stopped and he was started on a prednisone taper.  His symptoms have persisted but hypoxemia has improved.  He remains short of breath with pretty low-level activity.  He denies orthopnea or PND.  He has mild leg swelling, some of which is chronic and he has undergone vein harvesting from both legs.  No chest pain or pressure.  Would like to minimize some of his medicines if possible.  He was just evaluated by Dr. Caryl Comes who plans to  reactivate the rate responsiveness feature of his pacemaker.  Past Medical History:  Diagnosis Date   Arthritis    "some in my back" (04/14/2017)   CAD (coronary artery disease)    a.  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; lupus attacked them" (04/14/2017)   Mitral insufficiency    Moderate mitral regurgitation 10/02/2021   Echocardiogram 6/23: EF 35, global HK, mild ASH, mildly reduced RVSF, normal PASP, RVSP 31, mod LAD, mild RAE, mod MR, trivial AI, AV sclerosis w/o stenosis   Myocardial infarction ([place])    "I've had some mild one" (04/14/2017)   Presence of permanent cardiac pacemaker    Skin cancer    "nose"   VT (ventricular tachycardia)-nonsustained 05/20/2011    Past Surgical History:  Procedure Laterality Date   ACHILLES TENDON REPAIR Right    CARDIAC CATHETERIZATION     CARDIAC CATHETERIZATION N/A 11/03/2014   Procedure: Left Heart Cath and Cors/Grafts Angiography;  Surgeon: [name], MD;  Location: [place] [facility];  Service: Cardiovascular;  Laterality: N/A;   CARDIAC CATHETERIZATION N/A 11/03/2014   Procedure: Coronary  Stent Intervention;  Surgeon: [name], MD;  Location: [place] [facility];  Service: Cardiovascular;  Laterality: N/A;   CARDIOVERSION N/A 07/17/2021   Procedure: CARDIOVERSION;  Surgeon: [name], MD;  Location: [facility];  Service: Cardiovascular;  Laterality: N/A;   CARDIOVERSION N/A 10/08/2021   Procedure: CARDIOVERSION;  Surgeon: Freada Bergeron, MD;  Location: [facility];  Service: Cardiovascular;  Laterality: N/A;   CAROTID ENDARTERECTOMY  2002   "? side"   CATARACT EXTRACTION W/ INTRAOCULAR LENS IMPLANT     "? side"   CORONARY ANGIOPLASTY WITH STENT PLACEMENT   04/14/2017   CORONARY ANGIOPLASTY WITH STENT PLACEMENT  11/03/2014   SVG PVA   CORONARY ARTERY BYPASS GRAFT     with redo cabg   CORONARY BALLOON ANGIOPLASTY N/A 08/15/2021   Procedure: CORONARY BALLOON ANGIOPLASTY;  Surgeon: Burnell Blanks, MD;  Location: [place] [facility];  Service: Cardiovascular;  Laterality: N/A;   CORONARY STENT INTERVENTION N/A 10/18/2019   Procedure: CORONARY STENT INTERVENTION;  Surgeon: [name], MD;  Location: [place] [facility];  Service: Cardiovascular;  Laterality: N/A;   CORONARY/GRAFT ANGIOGRAPHY N/A 10/18/2019   Procedure: CORONARY/GRAFT ANGIOGRAPHY;  Surgeon: [name], MD;  Location: [place] [facility];  Service: Cardiovascular;  Laterality: N/A;   EP IMPLANTABLE DEVICE N/A 09/13/2014   Procedure: PPM Generator Changeout;  Surgeon: Deboraha Sprang, MD;  Location: [place] [facility];  Service: Cardiovascular;  Laterality: N/A;   LEFT HEART CATH AND CORS/GRAFTS ANGIOGRAPHY N/A 04/14/2017   Procedure: LEFT HEART CATH AND CORS/GRAFTS ANGIOGRAPHY;  Surgeon: [name], MD;  Location: [place] [facility];  Service: Cardiovascular;  Laterality: N/A;   LEFT HEART CATH AND CORS/GRAFTS ANGIOGRAPHY N/A 08/15/2021   Procedure: LEFT HEART CATH AND CORS/GRAFTS ANGIOGRAPHY;  Surgeon: Burnell Blanks, MD;  Location: [place] [facility];  Service: Cardiovascular;  Laterality: N/A;   MOHS SURGERY     "just outside my nose"   PACEMAKER INSERTION  2007   TONSILLECTOMY  1958    Current Medications: Current Meds  Medication Sig   acetaminophen (TYLENOL) 500 MG tablet Take 1,000 mg by mouth every 6 (six) hours as needed for moderate pain.   allopurinol (ZYLOPRIM) 100 MG tablet Take 100 mg by mouth at bedtime.   amLODipine (NORVASC) 2.5 MG tablet Take 2 tablets (5 mg total) by mouth daily.   apixaban (ELIQUIS) 5 MG TABS tablet Take 1 tablet (5 mg total) by mouth 2 (two) times daily.   clopidogrel (PLAVIX) 75 MG tablet Take 75 mg by mouth at  bedtime.   furosemide (LASIX) 80 MG tablet Take 1 tablet (80 mg total) by mouth daily.   isosorbide mononitrate (IMDUR) 120 MG 24 hr tablet TAKE 1 TABLET (120 MG TOTAL) BY MOUTH DAILY.   ketoconazole (NIZORAL) 2 % cream Apply 1 application. topically daily as needed (itching toes).   metoprolol succinate (TOPROL-XL) 50 MG 24 hr tablet TAKE 1 TABLET TWO TIMES DAILY. TAKE WITH OR IMMEDIATELY FOLLOWING MEALS.   mupirocin ointment (BACTROBAN) 2 % Apply 1 Application topically 3 (three) times daily.   nitroGLYCERIN (NITROSTAT) 0.4 MG SL tablet PLACE 1 TABLET UNDER THE TONGUE EVERY 5 MINUTES AS NEEDED FOR CHEST PAIN   pantoprazole (PROTONIX) 40 MG tablet Take 40 mg by mouth daily.   predniSONE (DELTASONE) 20 MG tablet Take 2 tablets (40 mg total) by mouth daily with breakfast. (Patient taking differently: Take 40 mg by mouth daily with breakfast. Per patient taking 30 mg)  REPATHA SURECLICK 607 MG/ML SOAJ Inject 140 mg into the skin every 14 (fourteen) days.   sulfamethoxazole-trimethoprim (BACTRIM) 400-80 MG tablet Take 1 tablet by mouth 3 (three) times a week. Take 1 tablet daily only on Monday, Wednesday, and Friday.   tamsulosin (FLOMAX) 0.4 MG CAPS Take 0.4 mg by mouth daily.   triamcinolone ointment (KENALOG) 0.1 % Apply 1 Application topically 2 (two) times daily.   [DISCONTINUED] amLODipine (NORVASC) 2.5 MG tablet Take 3 tablets (7.5 mg total) by mouth daily.   [DISCONTINUED] ranolazine (RANEXA) 1000 MG SR tablet Taking 1/2 tablet by mouth twice daily     Allergies:   Amiodarone   Social History   Socioeconomic History   Marital status: Married    Spouse name: Not on file   Number of children: Not on file   Years of education: Not on file   Highest education level: Not on file  Occupational History   Not on file  Tobacco Use   Smoking status: Never    Passive exposure: Yes   Smokeless tobacco: Never   Tobacco comments:    Never smoke 10/21/21  Vaping Use   Vaping Use: Never  used  Substance and Sexual Activity   Alcohol use: Yes    Alcohol/week: 3.0 standard drinks of alcohol    Types: 1 Glasses of wine, 1 Cans of beer, 1 Standard drinks or equivalent per week    Comment: 2-3 drinks per week 10/21/21   Drug use: No   Sexual activity: Not Currently  Other Topics Concern   Not on file  Social History Narrative   Not on file   Social Determinants of Health   Financial Resource Strain: Not on file  Food Insecurity: Not on file  Transportation Needs: Not on file  Physical Activity: Insufficiently Active (06/25/2017)   Exercise Vital Sign    Days of Exercise per Week: 4 days    Minutes of Exercise per Session: 30 min  Stress: No Stress Concern Present (06/25/2017)   [place]    Feeling of Stress : Not at all  Social Connections: Not on file     Family History: [name] family history includes Cancer in his mother; Heart attack in his father; Heart disease in his father.  ROS:   Please see the history of present illness.    All other systems reviewed and are negative.  EKGs/Labs/Other Studies Reviewed:    The following studies were reviewed today: Echo 10/01/2021: 1. Left ventricular ejection fraction, by estimation, is 45%. The left  ventricle has mildly decreased function. The left ventricle demonstrates  global hypokinesis. There is mild asymmetric left ventricular hypertrophy  of the septal segment. Left  ventricular diastolic parameters are indeterminate.   2. Right ventricular systolic function is mildly reduced. The right  ventricular size is normal. There is normal pulmonary artery systolic  pressure. The estimated right ventricular systolic pressure is 37.1 mmHg.   3. Left atrial size was moderately dilated.   4. Right atrial size was mildly dilated.   5. The mitral valve is normal in structure. Moderate mitral valve  regurgitation. No evidence of mitral stenosis.  Systolic blunting of  pulmonary vein flow.   6. The aortic valve is tricuspid. There is moderate calcification of the  aortic valve. There is moderate thickening of the aortic valve. Aortic  valve regurgitation is trivial. Aortic valve sclerosis is present, with no  evidence of aortic valve stenosis.  7. The inferior vena cava is dilated in size with <50% respiratory  variability, suggesting right atrial pressure of 15 mmHg.   Comparison(s): Compared to 2021 study, mitral regurgitation has increased.  Cannot access 05/18/21 study.   Cardiac Cath 08/15/2021:   LM lesion is 100% stenosed.   Mid LAD to Dist LAD lesion is 40% stenosed.   Mid LAD lesion is 75% stenosed.   Ost LAD lesion is 100% stenosed.   Ost Ramus to Ramus lesion is 100% stenosed.   Ost RCA lesion is 100% stenosed.   Prox Graft lesion is 100% stenosed.   1st Mrg lesion is 100% stenosed.   Dist Graft lesion is 80% stenosed.   Prox Graft lesion is 99% stenosed.   Ost Cx to Prox Cx lesion is 10% stenosed.   Non-stenotic RPDA lesion was previously treated.   Scoring balloon angioplasty was performed using a BALLN WOLVERINE 4.00X10.   Scoring balloon angioplasty was performed using a BALLN WOLVERINE 4.00X10.   Post intervention, there is a 10% residual stenosis.   Post intervention, there is a 30% residual stenosis.   LIMA and is normal in caliber.   and is large.   The graft exhibits no disease.   Severe triple vessel CAD s/p CABG with 2/3 patent bypass grafts Chronic occlusion of the ostial LAD. The mid and distal LAD fills from the patent LIMA graft. Stable moderate stenosis at the anastomosis of the LIMA gaft to the LAD Chronic occlusion of the intermediate branch that fills from left to left collaterals supplied by the LIMA to LAD.  Patent proximal and mid Circumflex stents. Known occlusion of SVG to OM (non injected) Chronic occlusion ostial RCA (non injected) Patent SVG to PDA. Severe stenosis in the stented  segment in the proximal SVG and severe stenosis in the stented segment in the distal SVG.  Successful PTCA with scoring balloon angioplasty of both stented segments in the SVG to PDA Normal LVEDP   Recommendations: Continue Plavix. I will have him take ASA in the am but this can be stopped once his Eliquis is resumed.   EKG:  EKG is not ordered today.    Recent Labs: 01/27/2022: Hemoglobin 12.8; Platelets 138 02/17/2022: BUN 83; Creatinine, Ser 3.31; Potassium 4.7; Sodium 139  Recent Lipid Panel    Component Value Date/Time   CHOL 133 10/28/2019 0820   TRIG 254 (H) 10/28/2019 0820   HDL 41 10/28/2019 0820   CHOLHDL 3.2 10/28/2019 0820   CHOLHDL 2.4 10/19/2019 0306   VLDL 55 (H) 10/19/2019 0306   LDLCALC 52 10/28/2019 0820   LDLDIRECT 47 10/28/2019 0820     Risk Assessment/Calculations:    CHA2DS2-VASc Score = 5   This indicates a 7.2% annual risk of stroke. [name] score is based upon: CHF History: 1 HTN History: 1 Diabetes History: 0 Stroke History: 0 Vascular Disease History: 1 Age Score: 2 Gender Score: 0               Physical Exam:    VS:  BP 124/74   Pulse 62   Ht '5\' 8"'$  (1.727 m)   Wt 172 lb (78 kg)   SpO2 95%   BMI 26.15 kg/m     Wt Readings from Last 3 Encounters:  02/21/22 172 lb (78 kg)  02/20/22 168 lb (76.2 kg)  02/05/22 172 lb 11.2 oz (78.3 kg)     GEN:  Well nourished, well developed, cushingoid appearance, in no acute distress HEENT: Normal NECK: No  JVD; No carotid bruits LYMPHATICS: No lymphadenopathy CARDIAC: RRR, 2/6 systolic murmur at the left lower sternal border RESPIRATORY:  Clear to auscultation without rales, wheezing or rhonchi  ABDOMEN: Soft, non-tender, non-distended MUSCULOSKELETAL: 1+ bilateral pretibial edema; No deformity  SKIN: Warm and dry NEUROLOGIC:  Alert and oriented x 3 PSYCHIATRIC:  Normal affect   ASSESSMENT:    1. Chronic systolic congestive heart failure (HCC)   2. Persistent atrial fibrillation  ([place])   3. Amiodarone pulmonary toxicity   4. Coronary artery disease involving native coronary artery of native heart with angina pectoris ([place])   5. Mixed hyperlipidemia   6. Essential hypertension    PLAN:    In order of problems listed above:  Difficult to manage in the setting of his kidney disease.  Recently with labs demonstrating acute kidney injury on a background of stage IIIb CKD.  Appears to have progressed to stage IV CKD.  Diuretics were reduced.  Discussed the interaction of cardiac and renal function with the patient today.  He is not a candidate for ACE/ARB/Entresto/spironolactone/Jardiance because of his degree of kidney disease.  He follows up with Dr Marval Regal of [facility] in early December.  I am going to reduce his amlodipine to 5 mg daily (has been treated with this for chronic angina).  Otherwise continue current management with furosemide 80 mg daily, isosorbide, and metoprolol succinate.  Referred to advanced heart failure clinic. Just seen by Dr. Caryl Comes.  Anticoagulated with apixaban.  Patient back in atrial fibrillation today.  Plan had been to reactivate the rate responsiveness feature of his pacemaker, but now back in atrial fibrillation.  Amiodarone is discontinued.  Pulmonary follow-up next month. CAD stable.  We will discontinue ranolazine.  Otherwise continue isosorbide, metoprolol at current doses and amlodipine at a reduced dose of 5 mg daily. Treated with Repatha LDL cholesterol 28. Blood pressure control  I will see the patient back in 3 months.  We will refer him to the heart failure clinic to help with management.  We will update a 2D echocardiogram to reassess LV function and degree of mitral regurgitation.           Medication Adjustments/Labs and Tests Ordered: Current medicines are reviewed at length with the patient today.  Concerns regarding medicines are outlined above.  Orders Placed This Encounter  Procedures   AMB referral to CHF  clinic   ECHOCARDIOGRAM COMPLETE   Meds ordered this encounter  Medications   amLODipine (NORVASC) 2.5 MG tablet    Sig: Take 2 tablets (5 mg total) by mouth daily.    Dispense:  180 tablet    Refill:  3    Dose DECREASE    Patient Instructions  Medication Instructions:  STOP Ranolazine (Ranxea) DECREASE Amlodipine to '5mg'$  daily *If you need a refill on your cardiac medications before your next appointment, please call your pharmacy*   Lab Work: NONE If you have labs (blood work) drawn today and your tests are completely normal, you will receive your results only by: [place] (if you have MyChart) OR A paper copy in the mail If you have any lab test that is abnormal or we need to change your treatment, we will call you to review the results.   Testing/Procedures: ECHO Your physician has requested that you have an echocardiogram. Echocardiography is a painless test that uses sound waves to create images of your heart. It provides your doctor with information about the size and shape of your heart and how  well your heart's chambers and valves are working. This procedure takes approximately one hour. There are no restrictions for this procedure. Please do NOT wear cologne, perfume, aftershave, or lotions (deodorant is allowed). Please arrive 15 minutes prior to your appointment time.  Ambulatory Referral to Heart Failure Clinic  Follow-Up: At [facility], you and your health needs are our priority.  As part of our continuing mission to provide you with exceptional heart care, we have created designated Provider Care Teams.  These Care Teams include your primary Cardiologist (physician) and Advanced Practice Providers (APPs -  Physician Assistants and Nurse Practitioners) who all work together to provide you with the care you need, when you need it.  Your next appointment:   3 month(s)  The format for your next appointment:   In Person  Provider:   [name], MD       Important Information About Sugar         Signed, [name], MD  02/21/2022 4:32 PM    [place]

## 2022-02-21 NOTE — Progress Notes (Signed)
Patient seen today for Dr. Caryl Comes to make adjustments to his Rate Response.    Refer to Dr. Olin Pia OV note for 02/20/22 detailing patients symptoms and needs.    Today patient is in Afib and has Mode Switch programmed on.  Presenting rhythm Afib with presenting DDIR mode. VP 66.  Unable to make any changes today for patient given presenting rhythm.  Patient has been in AFIB 100% since 01/27/22.   Referred patient to Afib clinic (appt next week) and forwarded to Dr. Caryl Comes for any further recommendations.

## 2022-02-21 NOTE — Patient Instructions (Signed)
Medication Instructions:  STOP Ranolazine (Ranxea) DECREASE Amlodipine to '5mg'$  daily *If you need a refill on your cardiac medications before your next appointment, please call your pharmacy*   Lab Work: NONE If you have labs (blood work) drawn today and your tests are completely normal, you will receive your results only by: [place] (if you have MyChart) OR A paper copy in the mail If you have any lab test that is abnormal or we need to change your treatment, we will call you to review the results.   Testing/Procedures: ECHO Your physician has requested that you have an echocardiogram. Echocardiography is a painless test that uses sound waves to create images of your heart. It provides your doctor with information about the size and shape of your heart and how well your heart's chambers and valves are working. This procedure takes approximately one hour. There are no restrictions for this procedure. Please do NOT wear cologne, perfume, aftershave, or lotions (deodorant is allowed). Please arrive 15 minutes prior to your appointment time.  Ambulatory Referral to Heart Failure Clinic  Follow-Up: At [facility], you and your health needs are our priority.  As part of our continuing mission to provide you with exceptional heart care, we have created designated Provider Care Teams.  These Care Teams include your primary Cardiologist (physician) and Advanced Practice Providers (APPs -  Physician Assistants and Nurse Practitioners) who all work together to provide you with the care you need, when you need it.  Your next appointment:   3 month(s)  The format for your next appointment:   In Person  Provider:   [name], MD       Important Information About Sugar

## 2022-02-24 NOTE — Patient Instructions (Signed)
Medication Instructions:  - Your physician recommends that you continue on your current medications as directed. Please refer to the Current Medication list given to you today.  *If you need a refill on your cardiac medications before your next appointment, please call your pharmacy*   Lab Work: - none ordered  If you have labs (blood work) drawn today and your tests are completely normal, you will receive your results only by: [place] (if you have MyChart) OR A paper copy in the mail If you have any lab test that is abnormal or we need to change your treatment, we will call you to review the results.   Testing/Procedures: - none ordered   Follow-Up: At [facility], you and your health needs are our priority.  As part of our continuing mission to provide you with exceptional heart care, we have created designated Provider Care Teams.  These Care Teams include your primary Cardiologist (physician) and Advanced Practice Providers (APPs -  Physician Assistants and Nurse Practitioners) who all work together to provide you with the care you need, when you need it.  We recommend signing up for the patient portal called "MyChart".  Sign up information is provided on this After Visit Summary.  MyChart is used to connect with patients for Virtual Visits (Telemedicine).  Patients are able to view lab/test results, encounter notes, upcoming appointments, etc.  Non-urgent messages can be sent to your provider as well.   To learn more about what you can do with MyChart, go to [email or web address].    Your next appointment:   3 month(s)  The format for your next appointment:   In Person  Provider:   [name], MD    Other Instructions N/a  Important Information About Sugar

## 2022-02-25 ENCOUNTER — Encounter: Payer: Medicare HMO | Admitting: *Deleted

## 2022-02-25 DIAGNOSIS — Z48812 Encounter for surgical aftercare following surgery on the circulatory system: Secondary | ICD-10-CM | POA: Diagnosis not present

## 2022-02-25 DIAGNOSIS — Z9861 Coronary angioplasty status: Secondary | ICD-10-CM

## 2022-02-25 NOTE — Progress Notes (Signed)
Daily Session Note  Patient Details  Name: Kyle Avila MRN: [id number] Date of Birth: 07-27-1945 Referring Provider:   Flowsheet Row Cardiac Rehab from 02/05/2022 in [facility] Cardiac and Pulmonary Rehab  Referring Provider [name] MD       Encounter Date: 02/25/2022  Check In:  Session Check In - 02/25/22 0839       Check-In   Supervising physician immediately available to respond to emergencies See telemetry face sheet for immediately available ER MD    Location [facility]-Cardiac & Pulmonary Rehab    Staff Present Heath Lark, RN, BSN, Jacklynn Bue, MS, ASCM CEP, Exercise Physiologist;Jessica S[place], MA, RCEP, CCRP, CCET    Virtual Visit No    Medication changes reported     Yes    Comments Renexa stopped   amlodipine dose cut in half    Fall or balance concerns reported    No    Warm-up and Cool-down Performed on first and last piece of equipment    Resistance Training Performed Yes    VAD Patient? No    PAD/SET Patient? No      Pain Assessment   Currently in Pain? No/denies                Social History   Tobacco Use  Smoking Status Never   Passive exposure: Yes  Smokeless Tobacco Never  Tobacco Comments   Never smoke 10/21/21    Goals Met:  Independence with exercise equipment Exercise tolerated well No report of concerns or symptoms today  Goals Unmet:  Not Applicable  Comments: Pt able to follow exercise prescription today without complaint.  Will continue to monitor for progression.    Dr. Emily Filbert is Medical Director for [place].  Dr. Ottie Glazier is Medical Director for [facility] Pulmonary Rehabilitation.

## 2022-02-26 ENCOUNTER — Ambulatory Visit (HOSPITAL_COMMUNITY): Payer: Medicare HMO | Attending: Cardiovascular Disease

## 2022-02-26 ENCOUNTER — Encounter: Payer: Self-pay | Admitting: *Deleted

## 2022-02-26 ENCOUNTER — Encounter: Payer: Self-pay | Admitting: Cardiovascular Disease

## 2022-02-26 DIAGNOSIS — Z9861 Coronary angioplasty status: Secondary | ICD-10-CM

## 2022-02-26 DIAGNOSIS — E782 Mixed hyperlipidemia: Secondary | ICD-10-CM | POA: Insufficient documentation

## 2022-02-26 DIAGNOSIS — B009 Herpesviral infection, unspecified: Secondary | ICD-10-CM | POA: Diagnosis not present

## 2022-02-26 DIAGNOSIS — B029 Zoster without complications: Secondary | ICD-10-CM | POA: Diagnosis not present

## 2022-02-26 DIAGNOSIS — I25119 Atherosclerotic heart disease of native coronary artery with unspecified angina pectoris: Secondary | ICD-10-CM | POA: Insufficient documentation

## 2022-02-26 DIAGNOSIS — S00511A Abrasion of lip, initial encounter: Secondary | ICD-10-CM | POA: Diagnosis not present

## 2022-02-26 DIAGNOSIS — J984 Other disorders of lung: Secondary | ICD-10-CM | POA: Diagnosis not present

## 2022-02-26 DIAGNOSIS — T462X5A Adverse effect of other antidysrhythmic drugs, initial encounter: Secondary | ICD-10-CM | POA: Diagnosis not present

## 2022-02-26 DIAGNOSIS — I10 Essential (primary) hypertension: Secondary | ICD-10-CM | POA: Insufficient documentation

## 2022-02-26 DIAGNOSIS — D485 Neoplasm of uncertain behavior of skin: Secondary | ICD-10-CM | POA: Diagnosis not present

## 2022-02-26 DIAGNOSIS — I5022 Chronic systolic (congestive) heart failure: Secondary | ICD-10-CM | POA: Insufficient documentation

## 2022-02-26 DIAGNOSIS — L0889 Other specified local infections of the skin and subcutaneous tissue: Secondary | ICD-10-CM | POA: Diagnosis not present

## 2022-02-26 DIAGNOSIS — Z85828 Personal history of other malignant neoplasm of skin: Secondary | ICD-10-CM | POA: Diagnosis not present

## 2022-02-26 DIAGNOSIS — I4819 Other persistent atrial fibrillation: Secondary | ICD-10-CM | POA: Diagnosis not present

## 2022-02-26 LAB — ECHOCARDIOGRAM COMPLETE: S' Lateral: 3.8 cm

## 2022-02-26 NOTE — Progress Notes (Signed)
Cardiac Individual Treatment Plan  Patient Details  Name: Kyle Avila MRN: [id number] Date of Birth: Jun 25, 1945 Referring Provider:   Flowsheet Row Cardiac Rehab from 02/05/2022 in [facility] Cardiac and Pulmonary Rehab  Referring Provider [name] MD       Initial Encounter Date:  Flowsheet Row Cardiac Rehab from 02/05/2022 in [facility] Cardiac and Pulmonary Rehab  Date 02/05/22       Visit Diagnosis: S/P PTCA (percutaneous transluminal coronary angioplasty)  Patient's Home Medications on Admission:  Current Outpatient Medications:    acetaminophen (TYLENOL) 500 MG tablet, Take 1,000 mg by mouth every 6 (six) hours as needed for moderate pain., Disp: , Rfl:    allopurinol (ZYLOPRIM) 100 MG tablet, Take 100 mg by mouth at bedtime., Disp: , Rfl:    amLODipine (NORVASC) 2.5 MG tablet, Take 2 tablets (5 mg total) by mouth daily., Disp: 180 tablet, Rfl: 3   apixaban (ELIQUIS) 5 MG TABS tablet, Take 1 tablet (5 mg total) by mouth 2 (two) times daily., Disp: 180 tablet, Rfl: 2   clopidogrel (PLAVIX) 75 MG tablet, Take 75 mg by mouth at bedtime., Disp: , Rfl:    furosemide (LASIX) 80 MG tablet, Take 1 tablet (80 mg total) by mouth daily., Disp: 90 tablet, Rfl: 3   isosorbide mononitrate (IMDUR) 120 MG 24 hr tablet, TAKE 1 TABLET (120 MG TOTAL) BY MOUTH DAILY., Disp: 90 tablet, Rfl: 3   ketoconazole (NIZORAL) 2 % cream, Apply 1 application. topically daily as needed (itching toes)., Disp: , Rfl:    metoprolol succinate (TOPROL-XL) 50 MG 24 hr tablet, TAKE 1 TABLET TWO TIMES DAILY. TAKE WITH OR IMMEDIATELY FOLLOWING MEALS., Disp: 180 tablet, Rfl: 3   mupirocin ointment (BACTROBAN) 2 %, Apply 1 Application topically 3 (three) times daily., Disp: , Rfl:    nitroGLYCERIN (NITROSTAT) 0.4 MG SL tablet, PLACE 1 TABLET UNDER THE TONGUE EVERY 5 MINUTES AS NEEDED FOR CHEST PAIN, Disp: 25 tablet, Rfl: 3   pantoprazole (PROTONIX) 40 MG tablet, Take 40 mg by mouth daily., Disp: , Rfl:    predniSONE  (DELTASONE) 20 MG tablet, Take 2 tablets (40 mg total) by mouth daily with breakfast. (Patient taking differently: Take 40 mg by mouth daily with breakfast. Per patient taking 30 mg), Disp: 90 tablet, Rfl: 2   REPATHA SURECLICK 086 MG/ML SOAJ, Inject 140 mg into the skin every 14 (fourteen) days., Disp: 6 mL, Rfl: 3   sulfamethoxazole-trimethoprim (BACTRIM) 400-80 MG tablet, Take 1 tablet by mouth 3 (three) times a week. Take 1 tablet daily only on Monday, Wednesday, and Friday., Disp: 30 tablet, Rfl: 0   tamsulosin (FLOMAX) 0.4 MG CAPS, Take 0.4 mg by mouth daily., Disp: , Rfl:    triamcinolone ointment (KENALOG) 0.1 %, Apply 1 Application topically 2 (two) times daily., Disp: , Rfl:   Past Medical History: Past Medical History:  Diagnosis Date   Arthritis    "some in my back" (04/14/2017)   CAD (coronary artery disease)    a.  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[place])    s/p PTVDP   Gout    HTN (hypertension)    Hyperlipidemia    Lupus ([place])    "that's why my kidneys are gone; lupus attacked them" (04/14/2017)   Mitral insufficiency    Moderate mitral regurgitation 10/02/2021  Echocardiogram 6/23: EF 45, global HK, mild ASH, mildly reduced RVSF, normal PASP, RVSP 31, mod LAD, mild RAE, mod MR, trivial AI, AV sclerosis w/o stenosis   Myocardial infarction ([place])    "I've had some mild one" (04/14/2017)   Presence of permanent cardiac pacemaker    Skin cancer    "nose"   VT (ventricular tachycardia)-nonsustained 05/20/2011    Tobacco Use: Social History   Tobacco Use  Smoking Status Never   Passive exposure: Yes  Smokeless Tobacco Never  Tobacco Comments   Never smoke 10/21/21    Labs: Review Flowsheet  More data may exist      Latest Ref Rng & Units 10/26/2014 05/04/2015 02/12/2016 10/19/2019 10/28/2019  Labs for ITP Cardiac and Pulmonary  Rehab  Cholestrol 100 - 199 mg/dL 163  159  159  90  133   LDL (calc) 0 - 99 mg/dL 92  84  87  NEG 2  52   Direct LDL 0 - 99 mg/dL - - - - 47   HDL-C >39 mg/dL 45.40  51  46  37  41   Trlycerides 0 - 149 mg/dL 126.0  118  130  275  254      Exercise Target Goals: Exercise Program Goal: Individual exercise prescription set using results from initial 6 min walk test and THRR while considering  patient's activity barriers and safety.   Exercise Prescription Goal: Initial exercise prescription builds to 30-45 minutes a day of aerobic activity, 2-3 days per week.  Home exercise guidelines will be given to patient during program as part of exercise prescription that the participant will acknowledge.   Education: Aerobic Exercise: - Group verbal and visual presentation on the components of exercise prescription. Introduces F.I.T.T principle from ACSM for exercise prescriptions.  Reviews F.I.T.T. principles of aerobic exercise including progression. Written material given at graduation.   Education: Resistance Exercise: - Group verbal and visual presentation on the components of exercise prescription. Introduces F.I.T.T principle from ACSM for exercise prescriptions  Reviews F.I.T.T. principles of resistance exercise including progression. Written material given at graduation.    Education: Exercise & Equipment Safety: - Individual verbal instruction and demonstration of equipment use and safety with use of the equipment. Flowsheet Row Cardiac Rehab from 02/20/2022 in [facility] Cardiac and Pulmonary Rehab  Date 01/29/22  Educator [facility]  Instruction Review Code 1- Verbalizes Understanding       Education: Exercise Physiology & General Exercise Guidelines: - Group verbal and written instruction with models to review the exercise physiology of the cardiovascular system and associated critical values. Provides general exercise guidelines with specific guidelines to those with heart or lung disease.     Education: Flexibility, Balance, Mind/Body Relaxation: - Group verbal and visual presentation with interactive activity on the components of exercise prescription. Introduces F.I.T.T principle from ACSM for exercise prescriptions. Reviews F.I.T.T. principles of flexibility and balance exercise training including progression. Also discusses the mind body connection.  Reviews various relaxation techniques to help reduce and manage stress (i.e. Deep breathing, progressive muscle relaxation, and visualization). Balance handout provided to take home. Written material given at graduation.   Activity Barriers & Risk Stratification:  Activity Barriers & Cardiac Risk Stratification - 02/05/22 1217       Activity Barriers & Cardiac Risk Stratification   Activity Barriers Other (comment);Deconditioning;Muscular Weakness;Shortness of Breath;Decreased Ventricular Function;Balance Concerns;Chest Pain/Angina    Comments quick onset of fatigue (rushing especially)    Cardiac Risk Stratification High  6 Minute Walk:  6 Minute Walk     Row Name 02/05/22 1216         6 Minute Walk   Phase Initial     Distance 1110 feet     Walk Time 6 minutes     # of Rest Breaks 0     MPH 2.1     METS 2.02     RPE 13     Perceived Dyspnea  2     VO2 Peak 7.06     Symptoms Yes (comment)     Comments SOB, chest pain with arm pain 4/10     Resting HR 58 bpm     Resting BP 128/64     Resting Oxygen Saturation  98 %     Exercise Oxygen Saturation  during 6 min walk 98 %     Max Ex. HR 75 bpm     Max Ex. BP 128/60     2 Minute Post BP 124/60              Oxygen Initial Assessment:   Oxygen Re-Evaluation:   Oxygen Discharge (Final Oxygen Re-Evaluation):   Initial Exercise Prescription:  Initial Exercise Prescription - 02/05/22 1200       Date of Initial Exercise RX and Referring Provider   Date 02/05/22    Referring Provider [name] MD      Oxygen   Maintain  Oxygen Saturation 88% or higher      Treadmill   MPH 1.8    Grade 0.5    Minutes 15    METs 2.5      Recumbant Bike   Level 1    RPM 50    Watts 10    Minutes 15    METs 2      REL-XR   Level 2    Speed 50    Minutes 15    METs 2      Track   Laps 27    Minutes 15    METs 2.47      Prescription Details   Frequency (times per week) 2    Duration Progress to 30 minutes of continuous aerobic without signs/symptoms of physical distress      Intensity   THRR 40-80% of Max Heartrate 92-126    Ratings of Perceived Exertion 11-13    Perceived Dyspnea 0-4      Progression   Progression Continue to progress workloads to maintain intensity without signs/symptoms of physical distress.      Resistance Training   Training Prescription Yes    Weight 4 lb    Reps 10-15             Perform Capillary Blood Glucose checks as needed.  Exercise Prescription Changes:   Exercise Prescription Changes     Row Name 02/05/22 1200 02/11/22 1500 02/25/22 1300         Response to Exercise   Blood Pressure (Admit) 128/64 102/58 110/58     Blood Pressure (Exercise) 128/60 132/70 142/62     Blood Pressure (Exit) 124/60 118/64 102/58     Heart Rate (Admit) 58 bpm 52 bpm 67 bpm     Heart Rate (Exercise) 75 bpm 76 bpm 96 bpm     Heart Rate (Exit) 66 bpm 68 bpm 66 bpm     Oxygen Saturation (Admit) 98 % -- --     Oxygen Saturation (Exercise) 98 % -- --     Rating of  Perceived Exertion (Exercise) _0 Perceived Dyspnea (Exercise) 2 -- --     Symptoms SOB, chest/arm pain 4/10 none none     Comments walk test results 1st full day of exercise --     Duration -- Progress to 30 minutes of  aerobic without signs/symptoms of physical distress Continue with 30 min of aerobic exercise without signs/symptoms of physical distress.     Intensity -- THRR unchanged THRR unchanged       Progression   Progression -- Continue to progress workloads to maintain intensity without  signs/symptoms of physical distress. Continue to progress workloads to maintain intensity without signs/symptoms of physical distress.     Average METs -- 2.9 3.1       Resistance Training   Training Prescription -- Yes Yes     Weight -- 4 lb 4 lb     Reps -- 10-15 10-15       Interval Training   Interval Training -- No No       Treadmill   MPH -- 2 2.3     Grade -- 0.5 0.5     Minutes -- 15 15     METs -- 2.64 2.92       Recumbant Bike   Level -- -- 3     Watts -- -- 23     Minutes -- -- 15     METs -- -- 2.91       REL-XR   Level -- 4 3     Minutes -- 15 15     METs -- -- 4.5       Oxygen   Maintain Oxygen Saturation -- -- 88% or higher              Exercise Comments:   Exercise Comments     Row Name 02/06/22 0907 02/18/22 0829         Exercise Comments First full day of exercise!  Patient was oriented to gym and equipment including functions, settings, policies, and procedures.  Patient's individual exercise prescription and treatment plan were reviewed.  All starting workloads were established based on the results of the 6 minute walk test done at initial orientation visit.  The plan for exercise progression was also introduced and progression will be customized based on patient's performance and goals. Fell in yard while doing yardwork, few scratches,no other injury               Exercise Goals and Review:   Exercise Goals     Row Name 02/05/22 1220             Exercise Goals   Increase Physical Activity Yes       Intervention Provide advice, education, support and counseling about physical activity/exercise needs.;Develop an individualized exercise prescription for aerobic and resistive training based on initial evaluation findings, risk stratification, comorbidities and participant's personal goals.       Expected Outcomes Short Term: Attend rehab on a regular basis to increase amount of physical activity.;Long Term: Exercising regularly at  least 3-5 days a week.;Long Term: Add in home exercise to make exercise part of routine and to increase amount of physical activity.       Increase Strength and Stamina Yes       Intervention Provide advice, education, support and counseling about physical activity/exercise needs.;Develop an individualized exercise prescription for aerobic and resistive training based on initial evaluation findings, risk stratification, comorbidities and participant's personal  goals.       Expected Outcomes Short Term: Increase workloads from initial exercise prescription for resistance, speed, and METs.;Short Term: Perform resistance training exercises routinely during rehab and add in resistance training at home;Long Term: Improve cardiorespiratory fitness, muscular endurance and strength as measured by increased METs and functional capacity (6MWT)       Able to understand and use rate of perceived exertion (RPE) scale Yes       Intervention Provide education and explanation on how to use RPE scale       Expected Outcomes Short Term: Able to use RPE daily in rehab to express subjective intensity level;Long Term:  Able to use RPE to guide intensity level when exercising independently       Able to understand and use Dyspnea scale Yes       Intervention Provide education and explanation on how to use Dyspnea scale       Expected Outcomes Long Term: Able to use Dyspnea scale to guide intensity level when exercising independently;Short Term: Able to use Dyspnea scale daily in rehab to express subjective sense of shortness of breath during exertion       Knowledge and understanding of Target Heart Rate Range (THRR) Yes       Intervention Provide education and explanation of THRR including how the numbers were predicted and where they are located for reference       Expected Outcomes Long Term: Able to use THRR to govern intensity when exercising independently;Short Term: Able to state/look up THRR;Short Term: Able to use  daily as guideline for intensity in rehab       Able to check pulse independently Yes       Intervention Provide education and demonstration on how to check pulse in carotid and radial arteries.;Review the importance of being able to check your own pulse for safety during independent exercise       Expected Outcomes Short Term: Able to explain why pulse checking is important during independent exercise;Long Term: Able to check pulse independently and accurately       Understanding of Exercise Prescription Yes       Intervention Provide education, explanation, and written materials on patient's individual exercise prescription       Expected Outcomes Short Term: Able to explain program exercise prescription;Long Term: Able to explain home exercise prescription to exercise independently                Exercise Goals Re-Evaluation :  Exercise Goals Re-Evaluation     Row Name 02/06/22 0907 02/11/22 1544 02/25/22 1340         Exercise Goal Re-Evaluation   Exercise Goals Review Able to understand and use rate of perceived exertion (RPE) scale;Able to understand and use Dyspnea scale;Knowledge and understanding of Target Heart Rate Range (THRR);Understanding of Exercise Prescription Increase Physical Activity;Increase Strength and Stamina;Understanding of Exercise Prescription Increase Physical Activity;Increase Strength and Stamina;Understanding of Exercise Prescription     Comments Reviewed RPE scale, THR and program prescription with pt today.  Pt voiced understanding and was given a copy of goals to take home. Wasil is off to a good start for the first session he has been here. He was able to do his initial exercise prescription plus more. He increased his level on the XR to 4 and also increased his speed to 2.0 mph. We will continue to monitor as he progresses in the program. Javius is doing well in rehab. He recently increased his overall average  MET level to 3.1 METs. He also has been walking  on the treadmill at a speed of 2.3 mph and an incline of 0.5%. He has tolerated 4 lb hand weights for resistance training as well. We will continue to monitor his progress in the program.     Expected Outcomes Short: Use RPE daily to regulate intensity.  Long: Follow program prescription in THR. Short: Continue to attend rehab and working with machines Long: Continue to build up overall strength and stamina Short: Continue to increase workload on the treadmill. Long: Continue to increase strength and stamina.              Discharge Exercise Prescription (Final Exercise Prescription Changes):  Exercise Prescription Changes - 02/25/22 1300       Response to Exercise   Blood Pressure (Admit) 110/58    Blood Pressure (Exercise) 142/62    Blood Pressure (Exit) 102/58    Heart Rate (Admit) 67 bpm    Heart Rate (Exercise) 96 bpm    Heart Rate (Exit) 66 bpm    Rating of Perceived Exertion (Exercise) 13    Symptoms none    Duration Continue with 30 min of aerobic exercise without signs/symptoms of physical distress.    Intensity THRR unchanged      Progression   Progression Continue to progress workloads to maintain intensity without signs/symptoms of physical distress.    Average METs 3.1      Resistance Training   Training Prescription Yes    Weight 4 lb    Reps 10-15      Interval Training   Interval Training No      Treadmill   MPH 2.3    Grade 0.5    Minutes 15    METs 2.92      Recumbant Bike   Level 3    Watts 23    Minutes 15    METs 2.91      REL-XR   Level 3    Minutes 15    METs 4.5      Oxygen   Maintain Oxygen Saturation 88% or higher             Nutrition:  Target Goals: Understanding of nutrition guidelines, daily intake of sodium <1567m, cholesterol <2085m calories 30% from fat and 7% or less from saturated fats, daily to have 5 or more servings of fruits and vegetables.  Education: All About Nutrition: -Group instruction provided by verbal,  written material, interactive activities, discussions, models, and posters to present general guidelines for heart healthy nutrition including fat, fiber, MyPlate, the role of sodium in heart healthy nutrition, utilization of the nutrition label, and utilization of this knowledge for meal planning. Follow up email sent as well. Written material given at graduation.   Biometrics:  Pre Biometrics - 02/05/22 1221       Pre Biometrics   Height _0  (1.727 m)    Weight 172 lb 11.2 oz (78.3 kg)    Waist Circumference 38 inches    Hip Circumference 37.25 inches    Waist to Hip Ratio 1.02 %    BMI (Calculated) 26.26    Single Leg Stand 2.9 seconds              Nutrition Therapy Plan and Nutrition Goals:  Nutrition Therapy & Goals - 02/05/22 0859       Nutrition Therapy   Diet Heart healthy, low Na, CKD stg 4 MNT    Drug/Food Interactions Purine/Gout  Protein (specify units) 70-75g    Fiber 28 grams    Whole Grain Foods 3 servings   or less depending on his potassium   Saturated Fats 15 max. grams    Fruits and Vegetables 8 servings/day    Sodium 1.5 grams      Personal Nutrition Goals   Nutrition Goal ST: inclde, but limit high potassium/phosphorus containing foods. Review paperwork. identify high sources of sodium in the foods he eats regularly.  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    Comments 76 y.o. M admitted to cardiac rehab s/p PTCA. PMHx includes CAD, chronic CHF, CKD stage 4, HTN, HLD, AFib, PVD, CHB with pacemaker, arthritis, gout, lupus, MI (2018), skin cancer. PSHx includes CABG (1988, 1996), stent (2021). Relevant medications includes furosemide, prednisone, bactrim. Recent labs: Basic metabolic panel: BUN 76, creatinine 2.73, GFR 23, BUN/creatinine ratio ratio 28. Johnwilliam reports no gout flare-ups in decades, provided handout for his reference if he were to have a flare up  again. He has been working with a neprhologist to manage his CKD stg 4 which he reports having since 1992. Discussed considerations for CKD including sodium, potassium, and phosphorus; his last blood result showed his potassium was on the high end of the normal range - recommended limiting high sources of potassium for the day, provided list. Aquilla is nervous today as he has been having issues with edema and feels it began when he started the prednisone which his MD is tapering him off of - encouraged him to connect with his MD regarding this as the increased lasix has not been taking off his fluid. We discussed sodium recommendations, food label reading, and sources that are higher in sodium. He reports being hungrier than usual due to the prednisone so he has been having  B: bacon 3x/week, fruits (blueberries, bananas, and dates) with a fiber rich cereal like grape nuts and  2% milk . He will usually have some OJ as well as coffee. S: english muffin with PB and jelly L: home made tuna or chicken salad sandwich with lettuce D: Chicken, fish. Steamed vegetables ( he buys vegetables fresh). He does not usually have them with any carbohydrates aside from some rice. Drinks: sipping water during the day to help reduce swelling. He reports using greek seasoning as well as another seasoning blend. He uses olive oil and some salt from a grinder. He reports using whole grain bread most of the time. 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 He feels that his sodium may be coming from the seasoning blends so he will check those when he gets home; suggested that if he uses it often, he could make his own seasoning blend without sodium - Foster reported that he would rather look for one lower in sodium.      Intervention Plan   Intervention Prescribe, educate and counsel regarding individualized specific dietary modifications aiming towards targeted core components such as weight, hypertension, lipid  management, diabetes, heart failure and other comorbidities.    Expected Outcomes Short Term Goal: Understand basic principles of dietary content, such as calories, fat, sodium, cholesterol and nutrients.;Long Term Goal: Adherence to prescribed nutrition plan.;Short Term Goal: A plan has been developed with personal nutrition goals set during dietitian appointment.             Nutrition Assessments:  MEDIFICTS Score Key: ?70 Need to make dietary changes  40-70  Heart Healthy Diet ? 40 Therapeutic Level Cholesterol Diet  Flowsheet Row Cardiac Rehab from 02/05/2022 in [facility] Cardiac and Pulmonary Rehab  Picture Your Plate Total Score on Admission 72      Picture Your Plate Scores: <97 Unhealthy dietary pattern with much room for improvement. 41-50 Dietary pattern unlikely to meet recommendations for good health and room for improvement. 51-60 More healthful dietary pattern, with some room for improvement.  >60 Healthy dietary pattern, although there may be some specific behaviors that could be improved.    Nutrition Goals Re-Evaluation:   Nutrition Goals Discharge (Final Nutrition Goals Re-Evaluation):   Psychosocial: Target Goals: Acknowledge presence or absence of significant depression and/or stress, maximize coping skills, provide positive support system. Participant is able to verbalize types and ability to use techniques and skills needed for reducing stress and depression.   Education: Stress, Anxiety, and Depression - Group verbal and visual presentation to define topics covered.  Reviews how body is impacted by stress, anxiety, and depression.  Also discusses healthy ways to reduce stress and to treat/manage anxiety and depression.  Written material given at graduation. Flowsheet Row Cardiac Rehab from 02/20/2022 in [facility] Cardiac and Pulmonary Rehab  Date 02/20/22  Educator [facility]  Instruction Review Code 1- [employer] Understanding       Education: Sleep  Hygiene -Provides group verbal and written instruction about how sleep can affect your health.  Define sleep hygiene, discuss sleep cycles and impact of sleep habits. Review good sleep hygiene tips.    Initial Review & Psychosocial Screening:  Initial Psych Review & Screening - 01/29/22 1426       Initial Review   Current issues with None Identified      Family Dynamics   Good Support System? Yes    Comments He states that he does not have many people for support. He has a few kids and one that is close by.      Barriers   Psychosocial barriers to participate in program There are no identifiable barriers or psychosocial needs.;The patient should benefit from training in stress management and relaxation.      Screening Interventions   Interventions To provide support and resources with identified psychosocial needs;Encouraged to exercise;Provide feedback about the scores to participant    Expected Outcomes Short Term goal: Utilizing psychosocial counselor, staff and physician to assist with identification of specific Stressors or current issues interfering with healing process. Setting desired goal for each stressor or current issue identified.;Long Term Goal: Stressors or current issues are controlled or eliminated.;Short Term goal: Identification and review with participant of any Quality of Life or Depression concerns found by scoring the questionnaire.;Long Term goal: The participant improves quality of Life and PHQ9 Scores as seen by post scores and/or verbalization of changes             Quality of Life Scores:   Quality of Life - 02/05/22 1221       Quality of Life   Select Quality of Life      Quality of Life Scores   Health/Function Pre 14.37 %    Socioeconomic Pre 21.75 %    Psych/Spiritual Pre 21.21 %    Family Pre 24.3 %    GLOBAL Pre 18.14 %            Scores of 19 and below usually indicate a poorer quality of life in these areas.  A difference of  2-3  points is a clinically meaningful difference.  A difference of  2-3 points in the total score of the Quality of Life Index has been associated with significant improvement in overall quality of life, self-image, physical symptoms, and general health in studies assessing change in quality of life.  PHQ-9: Review Flowsheet       02/05/2022 08/03/2017 07/01/2017  Depression screen PHQ 2/9  Decreased Interest 0 0 0  Down, Depressed, Hopeless 0 0 0  PHQ - 2 Score 0 0 0  Altered sleeping 3 - -  Tired, decreased energy 3 - -  Change in appetite 0 - -  Feeling bad or failure about yourself  0 - -  Trouble concentrating 0 - -  Moving slowly or fidgety/restless 0 - -  Suicidal thoughts 0 - -  PHQ-9 Score 6 - -  Difficult doing work/chores Somewhat difficult - -   Interpretation of Total Score  Total Score Depression Severity:  1-4 = Minimal depression, 5-9 = Mild depression, 10-14 = Moderate depression, 15-19 = Moderately severe depression, 20-27 = Severe depression   Psychosocial Evaluation and Intervention:  Psychosocial Evaluation - 01/29/22 1427       Psychosocial Evaluation & Interventions   Interventions Encouraged to exercise with the program and follow exercise prescription;Relaxation education;Stress management education    Comments He states that he does not have many people for support. He has a few kids and one that is close by.    Expected Outcomes Short: Start [facility] to help with mood. Long: Maintain a healthy mental state    Continue Psychosocial Services  Follow up required by staff             Psychosocial Re-Evaluation:   Psychosocial Discharge (Final Psychosocial Re-Evaluation):   Vocational Rehabilitation: Provide vocational rehab assistance to qualifying candidates.   Vocational Rehab Evaluation & Intervention:   Education: Education Goals: Education classes will be provided on a variety of topics geared toward better understanding of heart health and  risk factor modification. Participant will state understanding/return demonstration of topics presented as noted by education test scores.  Learning Barriers/Preferences:  Learning Barriers/Preferences - 01/29/22 1425       Learning Barriers/Preferences   Learning Barriers Sight    Learning Preferences Skilled Demonstration;Video             General Cardiac Education Topics:  AED/CPR: - Group verbal and written instruction with the use of models to demonstrate the basic use of the AED with the basic ABC's of resuscitation.   Anatomy and Cardiac Procedures: - Group verbal and visual presentation and models provide information about basic cardiac anatomy and function. Reviews the testing methods done to diagnose heart disease and the outcomes of the test results. Describes the treatment choices: Medical Management, Angioplasty, or Coronary Bypass Surgery for treating various heart conditions including Myocardial Infarction, Angina, Valve Disease, and Cardiac Arrhythmias.  Written material given at graduation. Flowsheet Row Cardiac Rehab from 02/20/2022 in [facility]  Education need identified 02/05/22       Medication Safety: - Group verbal and visual instruction to review commonly prescribed medications for heart and lung disease. Reviews the medication, class of the drug, and side effects. Includes the steps to properly store meds and maintain the prescription regimen.  Written material given at graduation.   Intimacy: - Group verbal instruction through game format to discuss how heart and lung disease can affect sexual intimacy. Written material given at graduation..   Know Your Numbers and Heart Failure: - Group verbal and visual instruction to discuss  disease risk factors for cardiac and pulmonary disease and treatment options.  Reviews associated critical values for Overweight/Obesity, Hypertension, Cholesterol, and Diabetes.  Discusses basics of heart  failure: signs/symptoms and treatments.  Introduces Heart Failure Zone chart for action plan for heart failure.  Written material given at graduation. Flowsheet Row Cardiac Rehab from 02/20/2022 in [facility]  Date 02/06/22  Educator SB  Instruction Review Code 1- Verbalizes Understanding       Infection Prevention: - Provides verbal and written material to individual with discussion of infection control including proper hand washing and proper equipment cleaning during exercise session. Flowsheet Row Cardiac Rehab from 02/20/2022 in [facility]  Date 01/29/22  Educator [facility]  Instruction Review Code 1- Verbalizes Understanding       Falls Prevention: - Provides verbal and written material to individual with discussion of falls prevention and safety. Flowsheet Row Cardiac Rehab from 02/20/2022 in [facility]  Date 01/29/22  Educator [facility]  Instruction Review Code 1- Verbalizes Understanding       Other: -Provides group and verbal instruction on various topics (see comments)   Knowledge Questionnaire Score:   Core Components/Risk Factors/Patient Goals at Admission:  Personal Goals and Risk Factors at Admission - 02/05/22 1224       Core Components/Risk Factors/Patient Goals on Admission    Weight Management Yes;Obesity    Intervention Weight Management: Develop a combined nutrition and exercise program designed to reach desired caloric intake, while maintaining appropriate intake of nutrient and fiber, sodium and fats, and appropriate energy expenditure required for the weight goal.;Weight Management: Provide education and appropriate resources to help participant work on and attain dietary goals.;Weight Management/Obesity: Establish reasonable short term and long term weight goals.;Obesity: Provide education and appropriate resources to help participant work on and attain dietary goals.    Admit Weight 172 lb 11.2 oz  (78.3 kg)    Goal Weight: Short Term 168 lb (76.2 kg)    Goal Weight: Long Term 160 lb (72.6 kg)    Expected Outcomes Short Term: Continue to assess and modify interventions until short term weight is achieved;Long Term: Adherence to nutrition and physical activity/exercise program aimed toward attainment of established weight goal;Weight Loss: Understanding of general recommendations for a balanced deficit meal plan, which promotes 1-2 lb weight loss per week and includes a negative energy balance of [phone] kcal/d;Understanding recommendations for meals to include 15-35% energy as protein, 25-35% energy from fat, 35-60% energy from carbohydrates, less than 263m of dietary cholesterol, 20-35 gm of total fiber daily;Understanding of distribution of calorie intake throughout the day with the consumption of 4-5 meals/snacks    Heart Failure Yes    Intervention Provide a combined exercise and nutrition program that is supplemented with education, support and counseling about heart failure. Directed toward relieving symptoms such as shortness of breath, decreased exercise tolerance, and extremity edema.    Expected Outcomes Improve functional capacity of life;Short term: Attendance in program 2-3 days a week with increased exercise capacity. Reported lower sodium intake. Reported increased fruit and vegetable intake. Reports medication compliance.;Short term: Daily weights obtained and reported for increase. Utilizing diuretic protocols set by physician.;Long term: Adoption of self-care skills and reduction of barriers for early signs and symptoms recognition and intervention leading to self-care maintenance.    Hypertension Yes    Intervention Provide education on lifestyle modifcations including regular physical activity/exercise, weight management, moderate sodium restriction and increased consumption of fresh fruit, vegetables, and  low fat dairy, alcohol moderation, and smoking cessation.;Monitor  prescription use compliance.    Expected Outcomes Long Term: Maintenance of blood pressure at goal levels.;Short Term: Continued assessment and intervention until BP is < 140/26m HG in hypertensive participants. < 130/861mHG in hypertensive participants with diabetes, heart failure or chronic kidney disease.    Lipids Yes    Intervention Provide education and support for participant on nutrition & aerobic/resistive exercise along with prescribed medications to achieve LDL <7054mHDL >47m31m  Expected Outcomes Short Term: Participant states understanding of desired cholesterol values and is compliant with medications prescribed. Participant is following exercise prescription and nutrition guidelines.;Long Term: Cholesterol controlled with medications as prescribed, with individualized exercise RX and with personalized nutrition plan. Value goals: LDL < 70mg38mL > 40 mg.             Education:Diabetes - Individual verbal and written instruction to review signs/symptoms of diabetes, desired ranges of glucose level fasting, after meals and with exercise. Acknowledge that pre and post exercise glucose checks will be done for 3 sessions at entry of program.   Core Components/Risk Factors/Patient Goals Review:    Core Components/Risk Factors/Patient Goals at Discharge (Final Review):    ITP Comments:  ITP Comments     Row Name 01/29/22 1422 02/05/22 1215 02/06/22 0906 02/18/22 0828 02/26/22 0955   ITP Comments Virtual Visit completed. Patient informed on EP and RD appointment and 6 Minute walk test. Patient also informed of patient health questionnaires on My Chart. Patient Verbalizes understanding. Visit diagnosis can be found in [facility]/2023. Completed 6MWT and gym orientation. Initial ITP created and sent for review to Dr. Mark Emily Filbertical Director. First full day of exercise!  Patient was oriented to gym and equipment including functions, settings, policies, and procedures.  Patient's  individual exercise prescription and treatment plan were reviewed.  All starting workloads were established based on the results of the 6 minute walk test done at initial orientation visit.  The plan for exercise progression was also introduced and progression will be customized based on patient's performance and goals. Fell in yard while doing yardwork, few scratches,no other injury 30 Day review completed. Medical Director ITP review done, changes made as directed, and signed approval by Medical Director.            Comments:

## 2022-02-28 ENCOUNTER — Encounter: Payer: Self-pay | Admitting: Cardiovascular Disease

## 2022-02-28 ENCOUNTER — Encounter: Payer: Self-pay | Admitting: Pulmonary Disease

## 2022-02-28 DIAGNOSIS — R053 Chronic cough: Secondary | ICD-10-CM

## 2022-02-28 DIAGNOSIS — J984 Other disorders of lung: Secondary | ICD-10-CM

## 2022-02-28 NOTE — Telephone Encounter (Signed)
Pt also sent in today:  Kyle Avila  to [place] [facility] (supporting [name], MD)    02/28/22  2:30 PM Yesterday my weight jumped  from 166 to 170  Seemed like a bunch of weight

## 2022-03-01 ENCOUNTER — Encounter: Payer: Self-pay | Admitting: Cardiovascular Disease

## 2022-03-01 NOTE — Telephone Encounter (Signed)
[place] for CT chest w/o contrast

## 2022-03-03 ENCOUNTER — Encounter: Payer: Self-pay | Admitting: Cardiovascular Disease

## 2022-03-03 ENCOUNTER — Encounter: Payer: Self-pay | Admitting: Pulmonary Disease

## 2022-03-05 ENCOUNTER — Ambulatory Visit (HOSPITAL_COMMUNITY)
Admission: RE | Admit: 2022-03-05 | Discharge: 2022-03-05 | Disposition: A | Discharge status: Home / Self Care | Payer: Medicare HMO | Source: Ambulatory Visit | Attending: Cardiology | Admitting: Cardiology

## 2022-03-05 ENCOUNTER — Encounter: Payer: Medicare HMO | Admitting: *Deleted

## 2022-03-05 ENCOUNTER — Encounter (HOSPITAL_COMMUNITY): Payer: Self-pay | Admitting: Cardiology

## 2022-03-05 ENCOUNTER — Encounter: Payer: Self-pay | Admitting: Cardiovascular Disease

## 2022-03-05 VITALS — BP 150/80 | HR 62 | Wt 175.0 lb

## 2022-03-05 DIAGNOSIS — T462X5A Adverse effect of other antidysrhythmic drugs, initial encounter: Secondary | ICD-10-CM | POA: Diagnosis not present

## 2022-03-05 DIAGNOSIS — I4819 Other persistent atrial fibrillation: Secondary | ICD-10-CM | POA: Diagnosis not present

## 2022-03-05 DIAGNOSIS — E782 Mixed hyperlipidemia: Secondary | ICD-10-CM | POA: Insufficient documentation

## 2022-03-05 DIAGNOSIS — I5020 Unspecified systolic (congestive) heart failure: Secondary | ICD-10-CM | POA: Diagnosis not present

## 2022-03-05 DIAGNOSIS — I10 Essential (primary) hypertension: Secondary | ICD-10-CM | POA: Diagnosis not present

## 2022-03-05 DIAGNOSIS — I25119 Atherosclerotic heart disease of native coronary artery with unspecified angina pectoris: Secondary | ICD-10-CM | POA: Diagnosis not present

## 2022-03-05 DIAGNOSIS — J984 Other disorders of lung: Secondary | ICD-10-CM | POA: Insufficient documentation

## 2022-03-05 LAB — COMPREHENSIVE METABOLIC PANEL
ALT: 52 U/L — ABNORMAL HIGH (ref 0–44)
AST: 36 U/L (ref 15–41)
Albumin: 3.7 g/dL (ref 3.5–5.0)
Alkaline Phosphatase: 59 U/L (ref 38–126)
Anion gap: 9 (ref 5–15)
BUN: 58 mg/dL — ABNORMAL HIGH (ref 8–23)
CO2: 23 mmol/L (ref 22–32)
Calcium: 9.1 mg/dL (ref 8.9–10.3)
Chloride: 102 mmol/L (ref 98–111)
Creatinine, Ser: 2.69 mg/dL — ABNORMAL HIGH (ref 0.61–1.24)
GFR, Estimated: 24 mL/min — ABNORMAL LOW (ref >60)
Glucose, Bld: 233 mg/dL — ABNORMAL HIGH (ref 70–99)
Potassium: 4.6 mmol/L (ref 3.5–5.1)
Sodium: 134 mmol/L — ABNORMAL LOW (ref 135–145)
Total Bilirubin: 0.7 mg/dL (ref 0.3–1.2)
Total Protein: 6 g/dL — ABNORMAL LOW (ref 6.5–8.1)

## 2022-03-05 LAB — FERRITIN: Ferritin: 87 ng/mL (ref 24–336)

## 2022-03-05 LAB — BRAIN NATRIURETIC PEPTIDE: B Natriuretic Peptide: 299 pg/mL — ABNORMAL HIGH (ref 0.0–100.0)

## 2022-03-05 NOTE — Telephone Encounter (Signed)
01/27/2022 lab results mailed to [facility]. [address], [place] [address] as pt requested.

## 2022-03-05 NOTE — Patient Instructions (Signed)
There has been no changes to your medications.  Labs done today, your results will be available in MyChart, we will contact you for abnormal readings.  Your physician has recommended that you have a cardiopulmonary stress test (CPX). CPX testing is a non-invasive measurement of heart and lung function. It replaces a traditional treadmill stress test. This type of test provides a tremendous amount of information that relates not only to your present condition but also for future outcomes. This test combines measurements of you ventilation, respiratory gas exchange in the lungs, electrocardiogram (EKG), blood pressure and physical response before, during, and following an exercise protocol. WE WILL CALL YOU TO ARRANGE THIS TEST.  Your physician recommends that you schedule a follow-up appointment in: 2 months (January 2024)  ** please call the office in December to arrange your follow up appointment **  If you have any questions or concerns before your next appointment please send [place] a message through [place] or call our office at [phone].    TO LEAVE A MESSAGE FOR THE NURSE SELECT OPTION 2, PLEASE LEAVE A MESSAGE INCLUDING: YOUR NAME DATE OF BIRTH CALL BACK NUMBER REASON FOR CALL**this is important as we prioritize the call backs  YOU WILL RECEIVE A CALL BACK THE SAME DAY AS LONG AS YOU CALL BEFORE 4:00 PM  At the [facility], you and your health needs are our priority. As part of our continuing mission to provide you with exceptional heart care, we have created designated Provider Care Teams. These Care Teams include your primary Cardiologist (physician) and Advanced Practice Providers (APPs- Physician Assistants and Nurse Practitioners) who all work together to provide you with the care you need, when you need it.   You may see any of the following providers on your designated Care Team at your next follow up: Dr Glori Bickers Dr Loralie Champagne Dr. Roxana Hires, NP Lyda Jester, [place] [facility] [place], [place] Forestine Na, NP Audry Riles, PharmD   Please be sure to bring in all your medications bottles to every appointment.

## 2022-03-05 NOTE — Progress Notes (Signed)
ReDS Vest / Clip - 03/05/22 1200       ReDS Vest / Clip   Station Marker C    Ruler Value 32.5    ReDS Value Range Low volume    ReDS Actual Value 30

## 2022-03-06 ENCOUNTER — Telehealth (HOSPITAL_COMMUNITY): Payer: Self-pay

## 2022-03-06 ENCOUNTER — Telehealth: Payer: Self-pay

## 2022-03-06 ENCOUNTER — Encounter: Payer: Medicare HMO | Attending: Cardiovascular Disease | Admitting: *Deleted

## 2022-03-06 ENCOUNTER — Encounter (HOSPITAL_COMMUNITY): Payer: Self-pay | Admitting: Physician Assistant

## 2022-03-06 ENCOUNTER — Ambulatory Visit (HOSPITAL_COMMUNITY)
Admission: RE | Admit: 2022-03-06 | Discharge: 2022-03-06 | Disposition: A | Discharge status: Home / Self Care | Payer: Medicare HMO | Source: Ambulatory Visit | Attending: Physician Assistant | Admitting: Physician Assistant

## 2022-03-06 ENCOUNTER — Other Ambulatory Visit (HOSPITAL_COMMUNITY): Payer: Self-pay

## 2022-03-06 VITALS — BP 146/86 | HR 69 | Ht 68.0 in | Wt 173.6 lb

## 2022-03-06 DIAGNOSIS — I2510 Atherosclerotic heart disease of native coronary artery without angina pectoris: Secondary | ICD-10-CM | POA: Diagnosis not present

## 2022-03-06 DIAGNOSIS — Z7901 Long term (current) use of anticoagulants: Secondary | ICD-10-CM | POA: Insufficient documentation

## 2022-03-06 DIAGNOSIS — Z951 Presence of aortocoronary bypass graft: Secondary | ICD-10-CM | POA: Insufficient documentation

## 2022-03-06 DIAGNOSIS — N184 Chronic kidney disease, stage 4 (severe): Secondary | ICD-10-CM | POA: Insufficient documentation

## 2022-03-06 DIAGNOSIS — I5042 Chronic combined systolic (congestive) and diastolic (congestive) heart failure: Secondary | ICD-10-CM | POA: Insufficient documentation

## 2022-03-06 DIAGNOSIS — Z950 Presence of cardiac pacemaker: Secondary | ICD-10-CM | POA: Diagnosis not present

## 2022-03-06 DIAGNOSIS — Z9861 Coronary angioplasty status: Secondary | ICD-10-CM | POA: Insufficient documentation

## 2022-03-06 DIAGNOSIS — D6869 Other thrombophilia: Secondary | ICD-10-CM

## 2022-03-06 DIAGNOSIS — I130 Hypertensive heart and chronic kidney disease with heart failure and stage 1 through stage 4 chronic kidney disease, or unspecified chronic kidney disease: Secondary | ICD-10-CM | POA: Diagnosis not present

## 2022-03-06 DIAGNOSIS — I4819 Other persistent atrial fibrillation: Secondary | ICD-10-CM | POA: Insufficient documentation

## 2022-03-06 MED ORDER — EMPAGLIFLOZIN 10 MG PO TABS: 10.0000 mg | ORAL_TABLET | Freq: Every day | ORAL | 0 refills | Status: DC

## 2022-03-06 NOTE — Telephone Encounter (Signed)
Crystal from [facility] office called to let us know that doctor states he is fine to resume rehab once his skin lesions dry and crusted. Patient confirmed they have been dry and crusted for several days now. Confirmed with patient to let us know if they are not otherwise. Patient is due to come in next week Wednesday, 11/15. Patient will let us know if his skin status changes at any point.

## 2022-03-06 NOTE — Progress Notes (Signed)
Primary Care Physician: [name], MD Primary Cardiologist: Dr Burt Knack Primary Electrophysiologist: Dr Caryl Comes Referring Physician: Dr Cristal Ford is a 76 y.o. male with a history of CAD, CHF/ischemic CM, carotid artery disease, CHB s/p PPM, CKD, HTN, HLD, atrial fibrillation who presents for follow up in the [facility].  The patient was initially diagnosed with atrial fibrillation 06/20/21 after the device clinic received an alert for an ongoing episode starting 05/26/21. Patient has a CHADS2VASC score of 5. He reports that since late January, he has felt much more fatigued with exertion. He does admit to snoring and daytime somnolence. Patient did have [place] 08/15/21 which showed severe three vessel disease, had POBA to both stented segments of SVG-PDA. He states his chest pain has greatly improved but he is still very fatigued. He was loaded on amiodarone in anticipation of repeat DCCV. Patient is s/p DCCV on 10/08/21. Unfortunately, his amiodarone was discontinued due to concern about lung toxicity.   On follow up today, patient reports that he remains chronically short of breath. He has been in afib since 10/2 on device report. No significant bleeding issues on anticoagulation.   Today, he denies symptoms of palpitations, chest pain, orthopnea, PND, lower extremity edema, dizziness, presyncope, syncope, bleeding, or neurologic sequela. The patient is tolerating medications without difficulties and is otherwise without complaint today.    Atrial Fibrillation Risk Factors:  he does have symptoms or diagnosis of sleep apnea. he is deferring sleep study for now. he does not have a history of rheumatic fever.   he has a BMI of Body mass index is 26.4 kg/m.Marland Kitchen Filed Weights   03/06/22 1109  Weight: 78.7 kg    Family History  Problem Relation Age of Onset   Heart attack Father        62s   Heart disease Father        Heart Disease before age 58    Cancer Mother      Atrial Fibrillation Management history:  Previous antiarrhythmic drugs: amiodarone  Previous cardioversions: 07/17/21, 10/08/21 Previous ablations: none CHADS2VASC score: 5 Anticoagulation history: Eliquis   Past Medical History:  Diagnosis Date   Arthritis    "some in my back" (04/14/2017)   CAD (coronary artery disease)    a.  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; lupus attacked them" (04/14/2017)   Mitral insufficiency    Moderate mitral regurgitation 10/02/2021   Echocardiogram 6/23: EF 35, global HK, mild ASH, mildly reduced RVSF, normal PASP, RVSP 31, mod LAD, mild RAE, mod MR, trivial AI, AV sclerosis w/o stenosis   Myocardial infarction ([place])    "I've had some mild one" (04/14/2017)   Presence of permanent cardiac pacemaker    Skin cancer    "nose"   VT (ventricular tachycardia)-nonsustained 05/20/2011   Past Surgical History:  Procedure Laterality Date   ACHILLES TENDON REPAIR Right    CARDIAC CATHETERIZATION     CARDIAC CATHETERIZATION N/A 11/03/2014   Procedure: Left Heart Cath and Cors/Grafts Angiography;  Surgeon: [name], MD;  Location: [place] [facility];  Service: Cardiovascular;  Laterality:  N/A;   CARDIAC CATHETERIZATION N/A 11/03/2014   Procedure: Coronary Stent Intervention;  Surgeon: [name], MD;  Location: [place] [facility];  Service: Cardiovascular;  Laterality: N/A;   CARDIOVERSION N/A 07/17/2021   Procedure: CARDIOVERSION;  Surgeon: [name], MD;  Location: [place];  Service: Cardiovascular;  Laterality: N/A;   CARDIOVERSION N/A 10/08/2021   Procedure: CARDIOVERSION;  Surgeon: Freada Bergeron, MD;  Location[facility];  Service: Cardiovascular;   Laterality: N/A;   CAROTID ENDARTERECTOMY  2002   "? side"   CATARACT EXTRACTION W/ INTRAOCULAR LENS IMPLANT     "? side"   CORONARY ANGIOPLASTY WITH STENT PLACEMENT  04/14/2017   CORONARY ANGIOPLASTY WITH STENT PLACEMENT  11/03/2014   SVG PVA   CORONARY ARTERY BYPASS GRAFT     with redo cabg   CORONARY BALLOON ANGIOPLASTY N/A 08/15/2021   Procedure: CORONARY BALLOON ANGIOPLASTY;  Surgeon: Burnell Blanks, MD;  Location: [place] [facility];  Service: Cardiovascular;  Laterality: N/A;   CORONARY STENT INTERVENTION N/A 10/18/2019   Procedure: CORONARY STENT INTERVENTION;  Surgeon: [name], MD;  Location: [place] [facility];  Service: Cardiovascular;  Laterality: N/A;   CORONARY/GRAFT ANGIOGRAPHY N/A 10/18/2019   Procedure: CORONARY/GRAFT ANGIOGRAPHY;  Surgeon: [name], MD;  Location: [place] [facility];  Service: Cardiovascular;  Laterality: N/A;   EP IMPLANTABLE DEVICE N/A 09/13/2014   Procedure: PPM Generator Changeout;  Surgeon: Deboraha Sprang, MD;  Location: [place] [facility];  Service: Cardiovascular;  Laterality: N/A;   LEFT HEART CATH AND CORS/GRAFTS ANGIOGRAPHY N/A 04/14/2017   Procedure: LEFT HEART CATH AND CORS/GRAFTS ANGIOGRAPHY;  Surgeon: [name], MD;  Location: [place] [facility];  Service: Cardiovascular;  Laterality: N/A;   LEFT HEART CATH AND CORS/GRAFTS ANGIOGRAPHY N/A 08/15/2021   Procedure: LEFT HEART CATH AND CORS/GRAFTS ANGIOGRAPHY;  Surgeon: Burnell Blanks, MD;  Location: [place] [facility];  Service: Cardiovascular;  Laterality: N/A;   MOHS SURGERY     "just outside my nose"   PACEMAKER INSERTION  2007   TONSILLECTOMY  1958    Current Outpatient Medications  Medication Sig Dispense Refill   acetaminophen (TYLENOL) 500 MG tablet Take 1,000 mg by mouth every 6 (six) hours as needed for moderate pain.     allopurinol (ZYLOPRIM) 100 MG tablet Take 100 mg by mouth at bedtime.     amLODipine (NORVASC) 2.5 MG tablet Take 2 tablets (5  mg total) by mouth daily. 180 tablet 3   apixaban (ELIQUIS) 5 MG TABS tablet Take 1 tablet (5 mg total) by mouth 2 (two) times daily. 180 tablet 2   clopidogrel (PLAVIX) 75 MG tablet Take 75 mg by mouth at bedtime.     empagliflozin (JARDIANCE) 10 MG TABS tablet Take 1 tablet (10 mg total) by mouth daily before breakfast. 30 tablet 0   furosemide (LASIX) 80 MG tablet Take 1 tablet (80 mg total) by mouth daily. 90 tablet 3   isosorbide mononitrate (IMDUR) 120 MG 24 hr tablet TAKE 1 TABLET (120 MG TOTAL) BY MOUTH DAILY. 90 tablet 3   ketoconazole (NIZORAL) 2 % cream Apply 1 application. topically daily as needed (itching toes).     metoprolol succinate (TOPROL-XL) 50 MG 24 hr tablet TAKE 1 TABLET TWO TIMES DAILY. TAKE WITH OR IMMEDIATELY FOLLOWING MEALS. 180 tablet 3   mupirocin ointment (BACTROBAN) 2 % Apply 1 Application topically 3 (three) times daily.     nitroGLYCERIN (NITROSTAT) 0.4 MG SL tablet PLACE 1 TABLET UNDER  THE TONGUE EVERY 5 MINUTES AS NEEDED FOR CHEST PAIN 25 tablet 3   pantoprazole (PROTONIX) 40 MG tablet Take 40 mg by mouth daily.     predniSONE (DELTASONE) 20 MG tablet Take 30 mg by mouth daily with breakfast.     REPATHA SURECLICK 024 MG/ML SOAJ Inject 140 mg into the skin every 14 (fourteen) days. 6 mL 3   sulfamethoxazole-trimethoprim (BACTRIM) 400-80 MG tablet Take 1 tablet by mouth 3 (three) times a week. Take 1 tablet daily only on Monday, Wednesday, and Friday. 30 tablet 0   tamsulosin (FLOMAX) 0.4 MG CAPS Take 0.4 mg by mouth daily.     triamcinolone ointment (KENALOG) 0.1 % Apply 1 Application topically 2 (two) times daily.     valACYclovir (VALTREX) 1000 MG tablet Take 1,000 mg by mouth 3 (three) times daily.     No current facility-administered medications for this encounter.    Allergies  Allergen Reactions   Amiodarone Other (See Comments)    Lung toxicity     Social History   Socioeconomic History   Marital status: Married    Spouse name: Not on file    Number of children: Not on file   Years of education: Not on file   Highest education level: Not on file  Occupational History   Not on file  Tobacco Use   Smoking status: Never    Passive exposure: Yes   Smokeless tobacco: Never   Tobacco comments:    Never smoke 10/21/21  Vaping Use   Vaping Use: Never used  Substance and Sexual Activity   Alcohol use: Yes    Alcohol/week: 3.0 standard drinks of alcohol    Types: 1 Glasses of wine, 1 Cans of beer, 1 Standard drinks or equivalent per week    Comment: 2-3 drinks per week 10/21/21   Drug use: No   Sexual activity: Not Currently  Other Topics Concern   Not on file  Social History Narrative   Not on file   Social Determinants of Health   Financial Resource Strain: Not on file  Food Insecurity: Not on file  Transportation Needs: Not on file  Physical Activity: Insufficiently Active (06/25/2017)   Exercise Vital Sign    Days of Exercise per Week: 4 days    Minutes of Exercise per Session: 30 min  Stress: No Stress Concern Present (06/25/2017)   [place]    Feeling of Stress : Not at all  Social Connections: Not on file  Intimate Partner Violence: Not on file     ROS- All systems are reviewed and negative except as per the HPI above.  Physical Exam: Vitals:   03/06/22 1109  BP: (!) 146/86  Pulse: 69  Weight: 78.7 kg  Height: '5\' 8"'$  (1.727 m)    GEN- The patient is a well appearing elderly male, alert and oriented x 3 today.   HEENT-head normocephalic, atraumatic, sclera clear, conjunctiva pink, hearing intact, trachea midline. Lungs- Clear to ausculation bilaterally, normal work of breathing Heart- Regular rate and rhythm, no rubs or gallops, 2/6 systolic murmur   GI- soft, NT, ND, + BS Extremities- no clubbing, cyanosis, 1-2+ bilateral edema MS- no significant deformity or atrophy Skin- no rash or lesion Psych- euthymic mood, full affect Neuro-  strength and sensation are intact   Wt Readings from Last 3 Encounters:  03/06/22 78.7 kg  03/05/22 79.4 kg  02/21/22 78 kg    EKG today demonstrates  V  pacing with underlying afib Vent. rate 69 BPM PR interval * ms QRS duration 186 ms QT/QTcB 470/503 ms  Echo 08/19/19 demonstrated   1. Left ventricular ejection fraction, by estimation, is 45 to 50%. The  left ventricle has mildly decreased function. The left ventricle  demonstrates regional wall motion abnormalities (see scoring  diagram/findings for description). Indeterminate diastolic filling due to E-A fusion.   2. Right ventricular systolic function is mildly reduced. The right  ventricular size is normal. There is normal pulmonary artery systolic  pressure. The estimated right ventricular systolic pressure is 03.2 mmHg.   3. The mitral valve is grossly normal. Mild to moderate mitral valve  regurgitation. No evidence of mitral stenosis.   4. The aortic valve is tricuspid. There is moderate calcification of the  aortic valve. There is moderate thickening of the aortic valve. Aortic  valve regurgitation is not visualized. Aortic valve sclerosis is present,  with no evidence of aortic valve stenosis.   5. The inferior vena cava is normal in size with greater than 50%  respiratory variability, suggesting right atrial pressure of 3 mmHg.   Comparison(s): No significant change from prior study. EF still 45-50%.  Basal to mid inferior/inferolateral HK.   Epic records are reviewed at length today  CHA2DS2-VASc Score = 5  [name] score is based upon: CHF History: 1 HTN History: 1 Diabetes History: 0 Stroke History: 0 Vascular Disease History: 1 Age Score: 2 Gender Score: 0       ASSESSMENT AND PLAN: 1. Persistent Atrial Fibrillation (ICD10:  I48.19) [name] CHA2DS2-VASc score is 5, indicating a 7.2% annual risk of stroke.   Patient off amiodarone due to suspected lung toxicity. We discussed rate vs  rhythm control today. Dofetilide likely not a good option as he would only qualify for 125 mcg and is borderline for being contraindicated. Could consider afib ablation but given his comorbidities and lack of symptomatic improvement in SR, do not feel referral is warranted at this time. Will pursue rate control for now.  Continue Eliquis 5 mg BID Continue Toprol 50 mg BID  2. Secondary Hypercoagulable State (ICD10:  D68.69) The patient is at significant risk for stroke/thromboembolism based upon his CHA2DS2-VASc Score of 5.  Continue Apixaban (Eliquis).   3. CAD S/p CABG and multiple PCIs On [place]  Followed by Dr Burt Knack. No anginal symptoms today.  4. HTN Stable, no changes today.  5. 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   Follow up with Dr Caryl Comes and Dr Burt Knack as scheduled.    [facility] Kyle Avila, [place] [id number] [phone] 03/06/2022 11:36 AM

## 2022-03-06 NOTE — Telephone Encounter (Signed)
Patient aware and agreeable. Jardiance sent into [facility]. He is having labs done at Nephrology office and will make sure we have results.

## 2022-03-07 ENCOUNTER — Encounter: Payer: Self-pay | Admitting: Cardiovascular Disease

## 2022-03-10 ENCOUNTER — Ambulatory Visit
Admission: RE | Admit: 2022-03-10 | Discharge: 2022-03-10 | Disposition: A | Discharge status: Home / Self Care | Payer: Medicare HMO | Source: Ambulatory Visit | Attending: Pulmonary Disease | Admitting: Pulmonary Disease

## 2022-03-10 ENCOUNTER — Encounter: Payer: Self-pay | Admitting: Pulmonary Disease

## 2022-03-10 DIAGNOSIS — J984 Other disorders of lung: Secondary | ICD-10-CM | POA: Insufficient documentation

## 2022-03-10 DIAGNOSIS — T462X5A Adverse effect of other antidysrhythmic drugs, initial encounter: Secondary | ICD-10-CM | POA: Insufficient documentation

## 2022-03-10 DIAGNOSIS — R918 Other nonspecific abnormal finding of lung field: Secondary | ICD-10-CM | POA: Diagnosis not present

## 2022-03-10 DIAGNOSIS — J90 Pleural effusion, not elsewhere classified: Secondary | ICD-10-CM | POA: Diagnosis not present

## 2022-03-10 DIAGNOSIS — R053 Chronic cough: Secondary | ICD-10-CM | POA: Insufficient documentation

## 2022-03-11 ENCOUNTER — Encounter: Payer: Self-pay | Admitting: Pulmonary Disease

## 2022-03-11 ENCOUNTER — Ambulatory Visit (INDEPENDENT_AMBULATORY_CARE_PROVIDER_SITE_OTHER): Payer: Medicare HMO | Admitting: Pulmonary Disease

## 2022-03-11 VITALS — BP 136/70 | HR 65 | Temp 98.4°F | Wt 173.8 lb

## 2022-03-11 DIAGNOSIS — I130 Hypertensive heart and chronic kidney disease with heart failure and stage 1 through stage 4 chronic kidney disease, or unspecified chronic kidney disease: Secondary | ICD-10-CM | POA: Diagnosis not present

## 2022-03-11 DIAGNOSIS — J324 Chronic pansinusitis: Secondary | ICD-10-CM

## 2022-03-11 DIAGNOSIS — T462X5A Adverse effect of other antidysrhythmic drugs, initial encounter: Secondary | ICD-10-CM | POA: Diagnosis not present

## 2022-03-11 DIAGNOSIS — N184 Chronic kidney disease, stage 4 (severe): Secondary | ICD-10-CM | POA: Diagnosis not present

## 2022-03-11 DIAGNOSIS — I480 Paroxysmal atrial fibrillation: Secondary | ICD-10-CM | POA: Diagnosis not present

## 2022-03-11 DIAGNOSIS — T462X1D Poisoning by other antidysrhythmic drugs, accidental (unintentional), subsequent encounter: Secondary | ICD-10-CM

## 2022-03-11 DIAGNOSIS — I5023 Acute on chronic systolic (congestive) heart failure: Secondary | ICD-10-CM | POA: Diagnosis not present

## 2022-03-11 DIAGNOSIS — E1122 Type 2 diabetes mellitus with diabetic chronic kidney disease: Secondary | ICD-10-CM | POA: Diagnosis not present

## 2022-03-11 DIAGNOSIS — I25118 Atherosclerotic heart disease of native coronary artery with other forms of angina pectoris: Secondary | ICD-10-CM | POA: Diagnosis not present

## 2022-03-11 DIAGNOSIS — R0609 Other forms of dyspnea: Secondary | ICD-10-CM

## 2022-03-11 DIAGNOSIS — D696 Thrombocytopenia, unspecified: Secondary | ICD-10-CM | POA: Diagnosis not present

## 2022-03-11 DIAGNOSIS — R55 Syncope and collapse: Secondary | ICD-10-CM | POA: Diagnosis not present

## 2022-03-11 NOTE — Progress Notes (Signed)
Patient ID: Kyle Avila, male    DOB: 1945-11-24, 76 y.o.   MRN: [id number]  Chief Complaint  Patient presents with   Follow-up    Pt is here for follow up for cough. Pt states he is now ready to get off the prednisone due to not getting sleep and swelling on ankles. Pt also had CT scan done yesterday. Pt is still taking the bactrium M,W,F.     Referring provider: [name], MD  HPI:   Kyle Avila is a 76 y.o. 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 Most recent cardiology note x3 reviewed.  Overall, doing well.  No issues with hypoxemia shortly after starting prednisone therapy.  He continues taper.  Doing a bit of it on his own.  Plan 30 mg/month but he is already down to 20 mg.  He had a repeat CT scan yesterday that on my review and interpretation reveals clear lungs with resolution of prior scattered groundglass opacities.  His dyspnea is largely unchanged.  Not really better with steroids.  We discussed at length the likely etiologies of this, likely multifactorial related to heart failure, atrial fibrillation, resolution of amiodarone lung toxicity, underlying likely asthma.  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  This prompted cardiology to contact me with concern for amiodarone toxicity.  I retreated PFTs that showed stable capacities and volumes with newly reduced DLCO with subsequent high-res CT scan showed diffuse nodular GGO's concerning for pneumonitis.  Amiodarone was stopped and he was placed on prednisone 40 mg daily about 1 week ago.  In the interim.  Mild improvement in his dyspnea.  Still quite dyspneic compared to baseline.  However  he has noticed his O2 saturations are improving at home.  He was walked in the office today and did not desaturate.    He notes that Kyle Avila last visit we did a prolonged prednisone taper given his chronic cough that responds well to this.  He can like as he was tapering down and getting the lower dose of the prednisone dose when his symptoms first began.  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  Further competent in this diagnosis given his subsequent improvement with initial steroid therapy.  Discussed at length the plan timeframe for prednisone de-escalation of the next 3-6 months.  Need for prophylaxis versus PJP pneumonia.  He had a list about 4 pages long, a dozen or so questions they were all answered to the best of my ability.  HPI at initial visit: Cough present for ~4 years. Cough is severe. First noticed in fall and spring but now present year round. No clear exacerbating factors. Not worse in morning or at night or when supine. Constant throughout the day. Not worse when eating. Occasionally worse when eating dry crackers. Cough improves for weeks at a time after prednisone, usually 10 mg daily x 7 days. Most recent course was ~3 weeks ago.  Trialed flonase for post nasal drip and does not think it improved. Denies heartburn or regurgitation of food. Trialed once a day PPI for "months" without improved cough. Notes when first started his sputum was foul  smelling after sneezing. This resolved months to years ago. Endorses occasional sinus pressure that comes and goes. Unsure if cough is worse with pressure present or if prednisone improves sinus pressure. Denies post nasal drip or watery eyes. Does not think cough varies with seasons. No h/o asthma as child or younger adult. Denies DOE but notes significant issues with angina.   Had CXR per PCP for this cough but notes its been at least a couple of years. I can not acces this image. CXR  reviewed 12/2005 with new R sided pacemaker placement, lungs clear, no evidence of hyperinflation on my interpretation.   PMH:  Smoker/ Smoking History: never smoker  Social History: Never smoker. Married. Retired from [employer].  Surgical history: CABG x 2, multiple PCIs  Family history: Father with heart attack in 29s, mother with cancer  PFT: 11/2019 reviewed and interpreted as normal spirometry, no bronchodilator response, lung volumes within normal limits, corrected DLCO within normal limits-normal PFTs  Lab Results: Personally reviewed, abs eos 300  CBC    Component Value Date/Time   WBC 6.5 01/27/2022 1209   WBC 3.7 (L) 09/26/2021 0916   RBC 3.56 (L) 01/27/2022 1209   RBC 3.60 (L) 09/26/2021 0916   HGB 12.8 (L) 01/27/2022 1209   HCT 38.2 01/27/2022 1209   PLT 138 (L) 01/27/2022 1209   MCV 107 (H) 01/27/2022 1209   MCH 36.0 (H) 01/27/2022 1209   MCH 35.0 (H) 09/26/2021 0916   MCHC 33.5 01/27/2022 1209   MCHC 32.1 09/26/2021 0916   RDW 13.9 01/27/2022 1209   LYMPHSABS 0.7 05/02/2021 1537   LYMPHSABS 1.1 10/14/2019 0900   MONOABS 0.2 05/02/2021 1537   EOSABS 0.0 05/02/2021 1537   EOSABS 0.3 10/14/2019 0900   BASOSABS 0.0 05/02/2021 1537   BASOSABS 0.0 10/14/2019 0900    BMET    Component Value Date/Time   NA 134 (L) 03/05/2022 1234   NA 139 02/17/2022 0900   K 4.6 03/05/2022 1234   CL 102 03/05/2022 1234   CO2 23 03/05/2022 1234   GLUCOSE 233 (H) 03/05/2022 1234   BUN 58 (H) 03/05/2022 1234   BUN 83 (HH) 02/17/2022 0900   CREATININE 2.69 (H) 03/05/2022 1234   CALCIUM 9.1 03/05/2022 1234   GFRNONAA 24 (L) 03/05/2022 1234   GFRAA 26 (L) 11/15/2019 1548    BNP    Component Value Date/Time   BNP 299.0 (H) 03/05/2022 1230    ProBNP No results found for: "PROBNP"   Imaging: Personally reviewed and as interpretation in HPI above.  Specialty Problems       Pulmonary Problems   Chronic pansinusitis   Chronic cough    Allergies  Allergen  Reactions   Amiodarone Other (See Comments)    Lung toxicity     Immunization History  Administered Date(s) Administered   DTaP, 5 pertussis antigens 08/29/2016   Fluad Quad(high Dose 65+) 01/27/2022   Influenza Split 01/27/2011, 01/27/2012, 01/10/2013, 07/27/2013   Influenza, High Dose Seasonal PF 01/31/2019   Influenza, Quadrivalent, Recombinant, Inj, Pf 02/04/2019, 02/04/2020   Influenza-Unspecified 01/10/2021   PFIZER(Purple Top)SARS-COV-2 Vaccination 05/11/2019, 06/02/2019, 06/27/2019, 02/27/2020, 08/05/2020   Pneumococcal Conjugate-13 08/09/2014   Pneumococcal Polysaccharide-23 02/27/2007   Zoster Recombinat (Shingrix) 01/27/2022   Zoster, Live 07/27/2013    Past Medical History:  Diagnosis Date   Arthritis    "some in my back" (04/14/2017)   CAD (coronary artery disease)    a.  s/p CABG 1988 with redo in 1996 with multiple PCIs since  then   Carotid artery occlusion    s/p RCEA   CKD (chronic kidney disease), stage IV (HCC)    Complete heart block (HCC)    s/p PTVDP   Gout    HTN (hypertension)    Hyperlipidemia    Lupus ([place])    "that's why my kidneys are gone; lupus attacked them" (04/14/2017)   Mitral insufficiency    Moderate mitral regurgitation 10/02/2021   Echocardiogram 6/23: EF 38, global HK, mild ASH, mildly reduced RVSF, normal PASP, RVSP 31, mod LAD, mild RAE, mod MR, trivial AI, AV sclerosis w/o stenosis   Myocardial infarction ([place])    "I've had some mild one" (04/14/2017)   Presence of permanent cardiac pacemaker    Skin cancer    "nose"   VT (ventricular tachycardia)-nonsustained 05/20/2011    Tobacco History: Social History   Tobacco Use  Smoking Status Never   Passive exposure: Yes  Smokeless Tobacco Never  Tobacco Comments   Never smoke 10/21/21   Counseling given: Not Answered Tobacco comments: Never smoke 10/21/21   Continue to not smoke  Outpatient Encounter Medications as of 03/11/2022  Medication Sig   acetaminophen (TYLENOL)  500 MG tablet Take 1,000 mg by mouth every 6 (six) hours as needed for moderate pain.   allopurinol (ZYLOPRIM) 100 MG tablet Take 100 mg by mouth at bedtime.   amLODipine (NORVASC) 2.5 MG tablet Take 2 tablets (5 mg total) by mouth daily.   apixaban (ELIQUIS) 5 MG TABS tablet Take 1 tablet (5 mg total) by mouth 2 (two) times daily.   clopidogrel (PLAVIX) 75 MG tablet Take 75 mg by mouth at bedtime.   empagliflozin (JARDIANCE) 10 MG TABS tablet Take 1 tablet (10 mg total) by mouth daily before breakfast.   furosemide (LASIX) 80 MG tablet Take 1 tablet (80 mg total) by mouth daily.   isosorbide mononitrate (IMDUR) 120 MG 24 hr tablet TAKE 1 TABLET (120 MG TOTAL) BY MOUTH DAILY.   ketoconazole (NIZORAL) 2 % cream Apply 1 application. topically daily as needed (itching toes).   metoprolol succinate (TOPROL-XL) 50 MG 24 hr tablet TAKE 1 TABLET TWO TIMES DAILY. TAKE WITH OR IMMEDIATELY FOLLOWING MEALS.   mupirocin ointment (BACTROBAN) 2 % Apply 1 Application topically 3 (three) times daily.   nitroGLYCERIN (NITROSTAT) 0.4 MG SL tablet PLACE 1 TABLET UNDER THE TONGUE EVERY 5 MINUTES AS NEEDED FOR CHEST PAIN   pantoprazole (PROTONIX) 40 MG tablet Take 40 mg by mouth daily.   predniSONE (DELTASONE) 20 MG tablet Take 30 mg by mouth daily with breakfast.   REPATHA SURECLICK 397 MG/ML SOAJ Inject 140 mg into the skin every 14 (fourteen) days.   sulfamethoxazole-trimethoprim (BACTRIM) 400-80 MG tablet Take 1 tablet by mouth 3 (three) times a week. Take 1 tablet daily only on Monday, Wednesday, and Friday.   tamsulosin (FLOMAX) 0.4 MG CAPS Take 0.4 mg by mouth daily.   triamcinolone ointment (KENALOG) 0.1 % Apply 1 Application topically 2 (two) times daily.   valACYclovir (VALTREX) 1000 MG tablet Take 1,000 mg by mouth 3 (three) times daily.   No facility-administered encounter medications on file as of 03/11/2022.     Review of Systems  Review of Systems  N/a  Physical Exam  BP 136/70 (BP Location:  Left Arm, Patient Position: Sitting, Cuff Size: Normal)   Pulse 65   Temp 98.4 F (36.9 C) (Oral)   Wt 173 lb 12.8 oz (78.8 kg)   SpO2 96%   BMI 26.43 kg/m  Wt Readings from Last 5 Encounters:  03/11/22 173 lb 12.8 oz (78.8 kg)  03/06/22 173 lb 9.6 oz (78.7 kg)  03/05/22 175 lb (79.4 kg)  02/21/22 172 lb (78 kg)  02/20/22 168 lb (76.2 kg)    BMI Readings from Last 5 Encounters:  03/11/22 26.43 kg/m  03/06/22 26.40 kg/m  03/05/22 26.61 kg/m  02/21/22 26.15 kg/m  02/20/22 25.54 kg/m     Physical Exam General: well appearing, no acute distress Eyes: EOMI, no icterus Respiratory: NWOB, lungs CTAB, no wheeze Cardiovascular: RRR, no murmurs, no gallops Skin: Warm, no rash, no lesions Neurologic: normal gait, CN intact Psych: Normal mood, full affect   Assessment & Plan:   Kyle Avila is a 76 y.o. 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  Chronic cough: Nagging for many years.  Productive.  Resolved with prednisone.  Not responded well to asthma therapy including triple inhaled therapy and Nucala (eosinophilia in the past).  Suspect this is from sinuses with CT sinuses 12/2021 revealing signs of chronic sinusitis.  Would like.  To be evaluated for ENT in the future if cough were to recur after prednisone taper.  I have tried everything I can without success and this does not appear to be coming from the lungs.  Amiodarone lung toxicity: Based on new hypoxemia and dyspnea as well as imaging findings and reduced DLCO.  No desaturation in clinic today with walking or at  rest, no role for oxygen at this time.  Improving with prednisone therapy.  Has responded well to prednisone therapy.  He is tapering a bit faster than planned.  However his imaging has improved.  Plan prednisone 20 mg for 2 weeks, then 15 mg for 2 weeks, then 10 mg for 2 weeks, then 5 mg for 2 weeks then stop.  Can stop his Bactrim prophylaxis which he has been adherent to once he starts 15 mg daily.   Return in about 2 months (around 05/11/2022).   Lanier Clam, MD 03/11/2022   This appointment required 41 minutes of patient care (this includes precharting, chart review, review of results, face-to-face care, etc.).

## 2022-03-11 NOTE — Patient Instructions (Signed)
Nice to see you again  Continue prednisone 20 mg for 2 weeks, then decrease by 5 mg every 2 weeks.  15 mg for 2 weeks, 10 mg for 2 weeks, 5 mg for 2 weeks, then stop  You can stop taking the Bactrim Monday Wednesday and Friday once you get to 15 mg of prednisone every day.  Please contact me and let me know what your prednisone supply looks like and we can catch up with a new prescription in the future.  It is okay to use which you have for now.  Return to clinic in 2 months or sooner as needed with Dr. Silas Flood

## 2022-03-12 ENCOUNTER — Ambulatory Visit: Payer: Medicare HMO
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
## 2022-03-13 ENCOUNTER — Ambulatory Visit: Payer: Medicare HMO | Attending: Nurse Practitioner | Admitting: Nurse Practitioner

## 2022-03-13 ENCOUNTER — Encounter: Payer: Self-pay | Admitting: Nurse Practitioner

## 2022-03-13 ENCOUNTER — Ambulatory Visit: Payer: Medicare HMO

## 2022-03-13 ENCOUNTER — Other Ambulatory Visit: Payer: Self-pay

## 2022-03-13 ENCOUNTER — Encounter: Payer: Self-pay | Admitting: Pulmonary Disease

## 2022-03-13 VITALS — BP 138/72 | HR 63 | Ht 68.0 in | Wt 172.6 lb

## 2022-03-13 DIAGNOSIS — I10 Essential (primary) hypertension: Secondary | ICD-10-CM

## 2022-03-13 DIAGNOSIS — I5022 Chronic systolic (congestive) heart failure: Secondary | ICD-10-CM | POA: Diagnosis not present

## 2022-03-13 DIAGNOSIS — E782 Mixed hyperlipidemia: Secondary | ICD-10-CM | POA: Diagnosis not present

## 2022-03-13 DIAGNOSIS — R55 Syncope and collapse: Secondary | ICD-10-CM

## 2022-03-13 DIAGNOSIS — E1122 Type 2 diabetes mellitus with diabetic chronic kidney disease: Secondary | ICD-10-CM | POA: Diagnosis not present

## 2022-03-13 DIAGNOSIS — I4819 Other persistent atrial fibrillation: Secondary | ICD-10-CM

## 2022-03-13 DIAGNOSIS — T462X5D Adverse effect of other antidysrhythmic drugs, subsequent encounter: Secondary | ICD-10-CM | POA: Diagnosis not present

## 2022-03-13 DIAGNOSIS — J984 Other disorders of lung: Secondary | ICD-10-CM | POA: Diagnosis not present

## 2022-03-13 DIAGNOSIS — I25119 Atherosclerotic heart disease of native coronary artery with unspecified angina pectoris: Secondary | ICD-10-CM | POA: Diagnosis not present

## 2022-03-13 MED ORDER — AMLODIPINE BESYLATE 2.5 MG PO TABS: 2.5000 mg | ORAL_TABLET | Freq: Every day | ORAL | 1 refills | Status: DC

## 2022-03-13 NOTE — Patient Instructions (Signed)
Medication Instructions:  DECREASE Norvasc to 2.'5mg'$  Take 1 tablet daily *If you need a refill on your cardiac medications before your next appointment, please call your pharmacy*   Lab Work: None Ordered   Testing/Procedures: None ordered    Follow-Up: At [facility], you and your health needs are our priority.  As part of our continuing mission to provide you with exceptional heart care, we have created designated Provider Care Teams.  These Care Teams include your primary Cardiologist (physician) and Advanced Practice Providers (APPs -  Physician Assistants and Nurse Practitioners) who all work together to provide you with the care you need, when you need it.  We recommend signing up for the patient portal called "MyChart".  Sign up information is provided on this After Visit Summary.  MyChart is used to connect with patients for Virtual Visits (Telemedicine).  Patients are able to view lab/test results, encounter notes, upcoming appointments, etc.  Non-urgent messages can be sent to your provider as well.   To learn more about what you can do with MyChart, go to [email or web address].    Your next appointment:   3 month(s)  The format for your next appointment:   In Person  Provider:   Ambrose Pancoast, NP        Other Instructions Check your weight daily; contact the office if you gain more than 2lbs in a day or 5lbs in a week  Increase your fluid intake to 64oz a day   Important Information About Sugar

## 2022-03-13 NOTE — Telephone Encounter (Signed)
Dr. Silas Flood, please advise if ok to send a script for '10mg'$  pred. Thanks.

## 2022-03-13 NOTE — Telephone Encounter (Signed)
Currently on predniosne taper will refuse current script

## 2022-03-14 ENCOUNTER — Other Ambulatory Visit (HOSPITAL_COMMUNITY): Payer: Self-pay

## 2022-03-14 ENCOUNTER — Telehealth (HOSPITAL_COMMUNITY): Payer: Self-pay

## 2022-03-14 MED ORDER — EMPAGLIFLOZIN 10 MG PO TABS: 10.0000 mg | ORAL_TABLET | Freq: Every day | ORAL | 1 refills | Status: DC

## 2022-03-14 NOTE — Telephone Encounter (Signed)
Patient left message he needs a [facility] test in [place]. I was not sure how to do for [facility]. Can you assist? He also requested a refill which I will handle.

## 2022-03-17 ENCOUNTER — Telehealth (HOSPITAL_COMMUNITY): Payer: Self-pay | Admitting: *Deleted

## 2022-03-17 ENCOUNTER — Encounter: Payer: Self-pay | Admitting: Cardiovascular Disease

## 2022-03-17 ENCOUNTER — Telehealth: Payer: Self-pay | Admitting: Pulmonary Disease

## 2022-03-17 ENCOUNTER — Encounter: Payer: Self-pay | Admitting: Pulmonary Disease

## 2022-03-17 ENCOUNTER — Other Ambulatory Visit (HOSPITAL_COMMUNITY): Payer: Self-pay | Admitting: *Deleted

## 2022-03-17 DIAGNOSIS — I5023 Acute on chronic systolic (congestive) heart failure: Secondary | ICD-10-CM | POA: Diagnosis not present

## 2022-03-17 DIAGNOSIS — I480 Paroxysmal atrial fibrillation: Secondary | ICD-10-CM | POA: Diagnosis not present

## 2022-03-17 DIAGNOSIS — R739 Hyperglycemia, unspecified: Secondary | ICD-10-CM | POA: Diagnosis not present

## 2022-03-17 DIAGNOSIS — N184 Chronic kidney disease, stage 4 (severe): Secondary | ICD-10-CM | POA: Diagnosis not present

## 2022-03-17 DIAGNOSIS — I6521 Occlusion and stenosis of right carotid artery: Secondary | ICD-10-CM | POA: Diagnosis not present

## 2022-03-17 DIAGNOSIS — I130 Hypertensive heart and chronic kidney disease with heart failure and stage 1 through stage 4 chronic kidney disease, or unspecified chronic kidney disease: Secondary | ICD-10-CM | POA: Diagnosis not present

## 2022-03-17 DIAGNOSIS — I5022 Chronic systolic (congestive) heart failure: Secondary | ICD-10-CM

## 2022-03-17 DIAGNOSIS — R55 Syncope and collapse: Secondary | ICD-10-CM | POA: Diagnosis not present

## 2022-03-17 DIAGNOSIS — Z9889 Other specified postprocedural states: Secondary | ICD-10-CM

## 2022-03-17 NOTE — Telephone Encounter (Signed)
Lab ordered mailed to patients home . Pt requested to have bmet at [place].

## 2022-03-17 NOTE — Telephone Encounter (Signed)
Prednisone 10 mg number 30, refills 1

## 2022-03-17 NOTE — Telephone Encounter (Signed)
Dr. Silas Flood, pt is requesting '10mg'$  tabs to complete his taper. Please advise. Thanks.

## 2022-03-17 NOTE — Telephone Encounter (Signed)
Pt returned call to triage and lab order addressed in separate encounter Lab order mailed per Adolm Joseph, [place]

## 2022-03-17 NOTE — Telephone Encounter (Signed)
Updated vaccine information. Nothing further needed

## 2022-03-18 ENCOUNTER — Encounter (HOSPITAL_COMMUNITY): Payer: Self-pay | Admitting: Cardiology

## 2022-03-18 MED ORDER — PREDNISONE 10 MG PO TABS: 10.0000 mg | ORAL_TABLET | Freq: Every day | ORAL | 1 refills | Status: DC

## 2022-03-18 NOTE — Progress Notes (Addendum)
ADVANCED HEART FAILURE CLINIC NOTE  Referring Physician: [name], MD  Primary Care: [name], MD Primary Cardiologist:  HPI: Kyle Avila is a 76 y.o. 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   Kyle Avila has been followed by Dr. Burt Knack and Dr. Caryl Comes for several years now. He has undergone numerous interventions for CAD and vascular disease as noted above. Despite this he continues to have significantly worsening functional status. According to Kyle Avila, he was skiing over a year ago but now does not believe he can partake in activities requiring significant physical exertion due to fatigue and dyspnea. 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   Activity level/exercise tolerance:  NYHA III Orthopnea:  Sleeps on 2 pillows Paroxysmal noctural dyspnea:  yes, intermittent Chest pain/pressure:  yes, intermittent Orthostatic lightheadedness:  no Palpitations:  no Lower extremity edema:  intermittent Presyncope/syncope:  no Cough:  no  Past Medical History:  Diagnosis Date   Arthritis    "some in my back" (04/14/2017)   CAD (coronary artery disease)    a.  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[place])    s/p PTVDP   Gout    HTN (hypertension)    Hyperlipidemia    Lupus ([place])    "that's why my kidneys are gone; lupus attacked them" (04/14/2017)   Mitral insufficiency     Moderate mitral regurgitation 10/02/2021   Echocardiogram 6/23: EF 69, global HK, mild ASH, mildly reduced RVSF, normal PASP, RVSP 31, mod LAD, mild RAE, mod MR, trivial AI, AV sclerosis w/o stenosis   Myocardial infarction ([place])    "I've had some mild one" (04/14/2017)   Presence of permanent cardiac pacemaker    Skin cancer    "nose"   VT (ventricular tachycardia)-nonsustained 05/20/2011    Current Outpatient Medications  Medication Sig Dispense Refill   acetaminophen (TYLENOL) 500 MG tablet Take 1,000 mg by mouth every 6 (six) hours as needed for moderate pain.     allopurinol (ZYLOPRIM) 100 MG tablet Take 100 mg by mouth at bedtime.     apixaban (ELIQUIS) 5 MG TABS tablet Take 1 tablet (5 mg total) by mouth 2 (two) times daily. 180 tablet 2   clopidogrel (PLAVIX) 75 MG tablet Take 75 mg by mouth at bedtime.     furosemide (LASIX) 80 MG tablet Take 1 tablet (80 mg total) by mouth daily. 90 tablet 3   isosorbide mononitrate (IMDUR) 120 MG 24 hr tablet TAKE 1 TABLET (120 MG TOTAL) BY MOUTH DAILY. 90 tablet 3   ketoconazole (NIZORAL) 2 % cream Apply 1 application. topically daily as needed (itching toes).     metoprolol succinate (TOPROL-XL) 50 MG 24 hr tablet TAKE 1 TABLET TWO TIMES DAILY. TAKE WITH OR IMMEDIATELY FOLLOWING MEALS. 180 tablet 3   mupirocin ointment (BACTROBAN) 2 %  Apply 1 Application topically 3 (three) times daily.     nitroGLYCERIN (NITROSTAT) 0.4 MG SL tablet PLACE 1 TABLET UNDER THE TONGUE EVERY 5 MINUTES AS NEEDED FOR CHEST PAIN 25 tablet 3   pantoprazole (PROTONIX) 40 MG tablet Take 40 mg by mouth daily.     predniSONE (DELTASONE) 10 MG tablet Take 1 tablet (10 mg total) by mouth daily with breakfast. 30 tablet 1   predniSONE (DELTASONE) 20 MG tablet Take 30 mg by mouth daily with breakfast.     REPATHA SURECLICK 258 MG/ML SOAJ Inject 140 mg into the skin every 14 (fourteen) days. 6 mL 3   sulfamethoxazole-trimethoprim (BACTRIM) 400-80 MG tablet Take 1 tablet by mouth  3 (three) times a week. Take 1 tablet daily only on Monday, Wednesday, and Friday. 30 tablet 0   tamsulosin (FLOMAX) 0.4 MG CAPS Take 0.4 mg by mouth daily.     amLODipine (NORVASC) 2.5 MG tablet Take 1 tablet (2.5 mg total) by mouth daily. 90 tablet 1   BD PEN NEEDLE NANO U/F 32G X 4 MM MISC SMARTSIG:1 Each SUB-Q Daily     empagliflozin (JARDIANCE) 10 MG TABS tablet Take 1 tablet (10 mg total) by mouth daily before breakfast. 90 tablet 1   Lancets (ONETOUCH DELICA PLUS [id number]) MISC Apply 1 each topically 4 (four) times daily.     No current facility-administered medications for this encounter.    Allergies  Allergen Reactions   Amiodarone Other (See Comments)    Lung toxicity       Social History   Socioeconomic History   Marital status: Married    Spouse name: Not on file   Number of children: Not on file   Years of education: Not on file   Highest education level: Not on file  Occupational History   Not on file  Tobacco Use   Smoking status: Never    Passive exposure: Yes   Smokeless tobacco: Never   Tobacco comments:    Never smoke 10/21/21  Vaping Use   Vaping Use: Never used  Substance and Sexual Activity   Alcohol use: Yes    Alcohol/week: 3.0 standard drinks of alcohol    Types: 1 Glasses of wine, 1 Cans of beer, 1 Standard drinks or equivalent per week    Comment: 2-3 drinks per week 10/21/21   Drug use: No   Sexual activity: Not Currently  Other Topics Concern   Not on file  Social History Narrative   Not on file   Social Determinants of Health   Financial Resource Strain: Not on file  Food Insecurity: Not on file  Transportation Needs: Not on file  Physical Activity: Insufficiently Active (06/25/2017)   Exercise Vital Sign    Days of Exercise per Week: 4 days    Minutes of Exercise per Session: 30 min  Stress: No Stress Concern Present (06/25/2017)   [place]    Feeling of Stress  : Not at all  Social Connections: Not on file  Intimate Partner Violence: Not on file      Family History  Problem Relation Age of Onset   Heart attack Father        22s   Heart disease Father        Heart Disease before age 66   Cancer Mother     PHYSICAL EXAM: Vitals:   03/05/22 1149  BP: (!) 150/80  Pulse: 62  SpO2: 97%   GENERAL: Well  nourished, well developed, and in no apparent distress at rest.  HEENT: Negative for arcus senilis or xanthelasma. There is no scleral icterus.  The mucous membranes are pink and moist.   NECK: Supple, No masses. Normal carotid upstrokes without bruits. No masses or thyromegaly.    CHEST: There are no chest wall deformities. There is no chest wall tenderness. Respirations are unlabored.  Lungs- coarse lung sounds but otherwise CTA CARDIAC:  JVP: <8 cm H2O         Normal S1, S2  Normal rate with regular rhythm. No murmurs, rubs or gallops.  Pulses are 2+ and symmetrical in upper and lower extremities. no edema.  ABDOMEN: Soft, non-tender, non-distended. There are no masses or hepatomegaly. There are normal bowel sounds.  EXTREMITIES: Warm and well perfused with no cyanosis, clubbing.  LYMPHATIC: No axillary or supraclavicular lymphadenopathy.  NEUROLOGIC: Patient is oriented x3 with no focal or lateralizing neurologic deficits.  PSYCH: Patients affect is appropriate, there is no evidence of anxiety or depression.  SKIN: Warm and dry; no lesions or wounds.   DATA REVIEW  MGQ:QPYPPJ fibrillation with V paced rhythm  ECHO: 02/26/22: LVEF 45-50% with inferior/inferolateral hypokinesis, normal RV function.  10/01/21: LVEF 45%, RV function mildly reduced 08/19/19: LVEF 45-50%, RVF normal.   CATH: [facility]: 08/15/21:  Severe triple vessel CAD s/p CABG with 2/3 patent bypass grafts Chronic occlusion of the ostial LAD. The mid and distal LAD fills from the patent LIMA graft. Stable moderate stenosis at the anastomosis of the LIMA gaft to the LAD Chronic  occlusion of the intermediate branch that fills from left to left collaterals supplied by the LIMA to LAD.  Patent proximal and mid Circumflex stents. Known occlusion of SVG to OM (non injected) Chronic occlusion ostial RCA (non injected) Patent SVG to PDA. Severe stenosis in the stented segment in the proximal SVG and severe stenosis in the stented segment in the distal SVG.  Successful PTCA with scoring balloon angioplasty of both stented segments in the SVG to PDA Normal LVEDP   ASSESSMENT & PLAN:  Ischemic cardiomyopathy with mildly reduced LVEF Etiology of KD:TOIZTIWP, inferolateral hypokinesis, multiple PCIs and CABG as noted above NYHA class / AHA Stage:III Volume status & Diuretics: euvolemic Vasodilators:Amlodipine 2.'5mg'$ , losartan recently held. Will plan to add back ARB or ARNI on follow up. Kyle Avila is concerned about his renal function currently. I believe in the long term addition of an ARNI will be beneficial.  Beta-Blocker:metoprolol succinate '50mg'$  daily YKD:XIPJASN due to CKD Cardiometabolic:start jardiance '10mg'$  in accordance with CKD & HFrEF data.  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 In addition, plan to pursue RHC after the holidays to assess for underlying pulmonary hypertension or exercise induced rise in LA pressures.   2. CAD - multiple interventions has noted above. Likely etiology of a significant portion of his dyspnea.  - May potentially benefit from Ranexa in the future  3. CKD  - sCr improving - starting Jardiance   4. Persistent atrial fibrillation - Followed closely by Dr. Caryl Comes - EKG w/ afib & RV pacing. CPX to evaluate for chronotropic incompetence.   5. Carotid artery disease - s/p  CEA  6. Amiodarone lung toxicity  - PFTs w/ moderately reduced DLCO but no other signs of restrictive/obstructive disease.  - Continuing  prednisone taper  Kyle Avila Advanced Heart Failure Mechanical Circulatory Support

## 2022-03-18 NOTE — Telephone Encounter (Signed)
Returned call to patient who states that he would like to have a carotid US done prior to the end of the year since his last was in 09/2020. Will route to MD for approval.

## 2022-03-19 ENCOUNTER — Ambulatory Visit (INDEPENDENT_AMBULATORY_CARE_PROVIDER_SITE_OTHER): Payer: Medicare HMO

## 2022-03-19 ENCOUNTER — Encounter: Payer: Self-pay | Admitting: Internal Medicine

## 2022-03-19 DIAGNOSIS — I255 Ischemic cardiomyopathy: Secondary | ICD-10-CM

## 2022-03-19 NOTE — Telephone Encounter (Signed)
Approved. Thank you!

## 2022-03-24 DIAGNOSIS — Z9861 Coronary angioplasty status: Secondary | ICD-10-CM

## 2022-03-25 ENCOUNTER — Telehealth (HOSPITAL_COMMUNITY): Payer: Self-pay | Admitting: *Deleted

## 2022-03-25 LAB — CUP PACEART REMOTE DEVICE CHECK
Battery Impedance: 1008 Ohm
Battery Remaining Longevity: 51 mo
Battery Voltage: 2.77 V
Date Time Interrogation Session: 20231127140308
Implantable Lead Connection Status: 753985
Implantable Lead Connection Status: 753985
Implantable Lead Implant Date: 20070831
Implantable Lead Implant Date: 20070831
Implantable Lead Location: 753859
Implantable Lead Location: 753860
Implantable Lead Model: 4469
Implantable Lead Model: 4470
Implantable Lead Serial Number: 485949
Implantable Lead Serial Number: 553665
Implantable Pulse Generator Implant Date: 20160518
Lead Channel Impedance Value: 444 Ohm
Lead Channel Impedance Value: 467 Ohm
Lead Channel Pacing Threshold Amplitude: 0.625 V
Lead Channel Pacing Threshold Amplitude: 0.75 V
Lead Channel Pacing Threshold Pulse Width: 0.4 ms
Lead Channel Pacing Threshold Pulse Width: 0.4 ms
Lead Channel Setting Pacing Amplitude: 2 V
Lead Channel Setting Pacing Amplitude: 2.5 V
Lead Channel Setting Pacing Pulse Width: 0.46 ms
Lead Channel Setting Sensing Sensitivity: 2.8 mV
Zone Setting Status: 755011
Zone Setting Status: 755011

## 2022-03-25 NOTE — Telephone Encounter (Signed)
Spoke with pt lab order faxed to [facility]   [address], [place], [place] [address] Fax# [phone]

## 2022-03-26 ENCOUNTER — Other Ambulatory Visit (HOSPITAL_COMMUNITY): Payer: Self-pay | Admitting: Cardiology

## 2022-03-26 ENCOUNTER — Encounter: Payer: Self-pay | Admitting: *Deleted

## 2022-03-26 ENCOUNTER — Ambulatory Visit: Payer: Medicare HMO | Attending: Internal Medicine

## 2022-03-26 ENCOUNTER — Encounter: Payer: Self-pay | Admitting: Internal Medicine

## 2022-03-26 DIAGNOSIS — N184 Chronic kidney disease, stage 4 (severe): Secondary | ICD-10-CM | POA: Diagnosis not present

## 2022-03-26 DIAGNOSIS — H353211 Exudative age-related macular degeneration, right eye, with active choroidal neovascularization: Secondary | ICD-10-CM | POA: Diagnosis not present

## 2022-03-26 DIAGNOSIS — I5022 Chronic systolic (congestive) heart failure: Secondary | ICD-10-CM | POA: Diagnosis not present

## 2022-03-26 DIAGNOSIS — I4430 Unspecified atrioventricular block: Secondary | ICD-10-CM

## 2022-03-26 DIAGNOSIS — Z9861 Coronary angioplasty status: Secondary | ICD-10-CM

## 2022-03-26 LAB — PACEMAKER DEVICE OBSERVATION

## 2022-03-26 NOTE — Progress Notes (Signed)
Cardiac Individual Treatment Plan  Patient Details  Name: Kyle Avila MRN: [id number] Date of Birth: 1947-02-77 Referring Provider:   Flowsheet Row Cardiac Rehab from 02/05/2022 in [facility] Cardiac and Pulmonary Rehab  Referring Provider [name] MD       Initial Encounter Date:  Flowsheet Row Cardiac Rehab from 02/05/2022 in [facility] Cardiac and Pulmonary Rehab  Date 02/05/22       Visit Diagnosis: S/P PTCA (percutaneous transluminal coronary angioplasty)  Patient's Home Medications on Admission:  Current Outpatient Medications:    acetaminophen (TYLENOL) 500 MG tablet, Take 1,000 mg by mouth every 6 (six) hours as needed for moderate pain., Disp: , Rfl:    allopurinol (ZYLOPRIM) 100 MG tablet, Take 100 mg by mouth at bedtime., Disp: , Rfl:    amLODipine (NORVASC) 2.5 MG tablet, Take 1 tablet (2.5 mg total) by mouth daily., Disp: 90 tablet, Rfl: 1   apixaban (ELIQUIS) 5 MG TABS tablet, Take 1 tablet (5 mg total) by mouth 2 (two) times daily., Disp: 180 tablet, Rfl: 2   BD PEN NEEDLE NANO U/F 32G X 4 MM MISC, SMARTSIG:1 Each SUB-Q Daily, Disp: , Rfl:    clopidogrel (PLAVIX) 75 MG tablet, Take 75 mg by mouth at bedtime., Disp: , Rfl:    empagliflozin (JARDIANCE) 10 MG TABS tablet, Take 1 tablet (10 mg total) by mouth daily before breakfast., Disp: 90 tablet, Rfl: 1   furosemide (LASIX) 80 MG tablet, Take 1 tablet (80 mg total) by mouth daily., Disp: 90 tablet, Rfl: 3   isosorbide mononitrate (IMDUR) 120 MG 24 hr tablet, TAKE 1 TABLET (120 MG TOTAL) BY MOUTH DAILY., Disp: 90 tablet, Rfl: 3   ketoconazole (NIZORAL) 2 % cream, Apply 1 application. topically daily as needed (itching toes)., Disp: , Rfl:    Lancets (ONETOUCH DELICA PLUS NOMVEH20N) MISC, Apply 1 each topically 4 (four) times daily., Disp: , Rfl:    metoprolol succinate (TOPROL-XL) 50 MG 24 hr tablet, TAKE 1 TABLET TWO TIMES DAILY. TAKE WITH OR IMMEDIATELY FOLLOWING MEALS., Disp: 180 tablet, Rfl: 3   mupirocin ointment  (BACTROBAN) 2 %, Apply 1 Application topically 3 (three) times daily., Disp: , Rfl:    nitroGLYCERIN (NITROSTAT) 0.4 MG SL tablet, PLACE 1 TABLET UNDER THE TONGUE EVERY 5 MINUTES AS NEEDED FOR CHEST PAIN, Disp: 25 tablet, Rfl: 3   pantoprazole (PROTONIX) 40 MG tablet, Take 40 mg by mouth daily., Disp: , Rfl:    predniSONE (DELTASONE) 10 MG tablet, Take 1 tablet (10 mg total) by mouth daily with breakfast., Disp: 30 tablet, Rfl: 1   predniSONE (DELTASONE) 20 MG tablet, Take 30 mg by mouth daily with breakfast., Disp: , Rfl:    REPATHA SURECLICK 470 MG/ML SOAJ, Inject 140 mg into the skin every 14 (fourteen) days., Disp: 6 mL, Rfl: 3   sulfamethoxazole-trimethoprim (BACTRIM) 400-80 MG tablet, Take 1 tablet by mouth 3 (three) times a week. Take 1 tablet daily only on Monday, Wednesday, and Friday., Disp: 30 tablet, Rfl: 0   tamsulosin (FLOMAX) 0.4 MG CAPS, Take 0.4 mg by mouth daily., Disp: , Rfl:   Past Medical History: Past Medical History:  Diagnosis Date   Arthritis    "some in my back" (04/14/2017)   CAD (coronary artery disease)    a.  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[place])  s/p PTVDP   Gout    HTN (hypertension)    Hyperlipidemia    Lupus ([place])    "that's why my kidneys are gone; lupus attacked them" (04/14/2017)   Mitral insufficiency    Moderate mitral regurgitation 10/02/2021   Echocardiogram 6/23: EF 36, global HK, mild ASH, mildly reduced RVSF, normal PASP, RVSP 31, mod LAD, mild RAE, mod MR, trivial AI, AV sclerosis w/o stenosis   Myocardial infarction ([place])    "I've had some mild one" (04/14/2017)   Presence of permanent cardiac pacemaker    Skin cancer    "nose"   VT (ventricular tachycardia)-nonsustained 05/20/2011    Tobacco Use: Social History   Tobacco Use  Smoking Status Never   Passive exposure: Yes  Smokeless Tobacco  Never  Tobacco Comments   Never smoke 10/21/21    Labs: Review Flowsheet  More data may exist      Latest Ref Rng & Units 10/26/2014 05/04/2015 02/12/2016 10/19/2019 10/28/2019  Labs for ITP Cardiac and Pulmonary Rehab  Cholestrol 100 - 199 mg/dL 163  159  159  90  133   LDL (calc) 0 - 99 mg/dL 92  84  87  NEG 2  52   Direct LDL 0 - 99 mg/dL - - - - 47   HDL-C >39 mg/dL 45.40  51  46  37  41   Trlycerides 0 - 149 mg/dL 126.0  118  130  275  254      Exercise Target Goals: Exercise Program Goal: Individual exercise prescription set using results from initial 6 min walk test and THRR while considering  patient's activity barriers and safety.   Exercise Prescription Goal: Initial exercise prescription builds to 30-45 minutes a day of aerobic activity, 2-3 days per week.  Home exercise guidelines will be given to patient during program as part of exercise prescription that the participant will acknowledge.   Education: Aerobic Exercise: - Group verbal and visual presentation on the components of exercise prescription. Introduces F.I.T.T principle from ACSM for exercise prescriptions.  Reviews F.I.T.T. principles of aerobic exercise including progression. Written material given at graduation.   Education: Resistance Exercise: - Group verbal and visual presentation on the components of exercise prescription. Introduces F.I.T.T principle from ACSM for exercise prescriptions  Reviews F.I.T.T. principles of resistance exercise including progression. Written material given at graduation.    Education: Exercise & Equipment Safety: - Individual verbal instruction and demonstration of equipment use and safety with use of the equipment. Flowsheet Row Cardiac Rehab from 02/20/2022 in [facility] Cardiac and Pulmonary Rehab  Date 01/29/22  Educator [facility]  Instruction Review Code 1- Verbalizes Understanding       Education: Exercise Physiology & General Exercise Guidelines: - Group verbal and written  instruction with models to review the exercise physiology of the cardiovascular system and associated critical values. Provides general exercise guidelines with specific guidelines to those with heart or lung disease.    Education: Flexibility, Balance, Mind/Body Relaxation: - Group verbal and visual presentation with interactive activity on the components of exercise prescription. Introduces F.I.T.T principle from ACSM for exercise prescriptions. Reviews F.I.T.T. principles of flexibility and balance exercise training including progression. Also discusses the mind body connection.  Reviews various relaxation techniques to help reduce and manage stress (i.e. Deep breathing, progressive muscle relaxation, and visualization). Balance handout provided to take home. Written material given at graduation.   Activity Barriers & Risk Stratification:  Activity Barriers & Cardiac Risk Stratification - 02/05/22 1217  Activity Barriers & Cardiac Risk Stratification   Activity Barriers Other (comment);Deconditioning;Muscular Weakness;Shortness of Breath;Decreased Ventricular Function;Balance Concerns;Chest Pain/Angina    Comments quick onset of fatigue (rushing especially)    Cardiac Risk Stratification High             6 Minute Walk:  6 Minute Walk     Row Name 02/05/22 1216         6 Minute Walk   Phase Initial     Distance 1110 feet     Walk Time 6 minutes     # of Rest Breaks 0     MPH 2.1     METS 2.02     RPE 13     Perceived Dyspnea  2     VO2 Peak 7.06     Symptoms Yes (comment)     Comments SOB, chest pain with arm pain 4/10     Resting HR 58 bpm     Resting BP 128/64     Resting Oxygen Saturation  98 %     Exercise Oxygen Saturation  during 6 min walk 98 %     Max Ex. HR 75 bpm     Max Ex. BP 128/60     2 Minute Post BP 124/60              Oxygen Initial Assessment:   Oxygen Re-Evaluation:   Oxygen Discharge (Final Oxygen Re-Evaluation):   Initial  Exercise Prescription:  Initial Exercise Prescription - 02/05/22 1200       Date of Initial Exercise RX and Referring Provider   Date 02/05/22    Referring Provider [name] MD      Oxygen   Maintain Oxygen Saturation 88% or higher      Treadmill   MPH 1.8    Grade 0.5    Minutes 15    METs 2.5      Recumbant Bike   Level 1    RPM 50    Watts 10    Minutes 15    METs 2      REL-XR   Level 2    Speed 50    Minutes 15    METs 2      Track   Laps 27    Minutes 15    METs 2.47      Prescription Details   Frequency (times per week) 2    Duration Progress to 30 minutes of continuous aerobic without signs/symptoms of physical distress      Intensity   THRR 40-80% of Max Heartrate 92-126    Ratings of Perceived Exertion 11-13    Perceived Dyspnea 0-4      Progression   Progression Continue to progress workloads to maintain intensity without signs/symptoms of physical distress.      Resistance Training   Training Prescription Yes    Weight 4 lb    Reps 10-15             Perform Capillary Blood Glucose checks as needed.  Exercise Prescription Changes:   Exercise Prescription Changes     Row Name 02/05/22 1200 02/11/22 1500 02/25/22 1300         Response to Exercise   Blood Pressure (Admit) 128/64 102/58 110/58     Blood Pressure (Exercise) 128/60 132/70 142/62     Blood Pressure (Exit) 124/60 118/64 102/58     Heart Rate (Admit) 58 bpm 52 bpm 67 bpm     Heart Rate (  Exercise) 75 bpm 76 bpm 96 bpm     Heart Rate (Exit) 66 bpm 68 bpm 66 bpm     Oxygen Saturation (Admit) 98 % -- --     Oxygen Saturation (Exercise) 98 % -- --     Rating of Perceived Exertion (Exercise) _0 Perceived Dyspnea (Exercise) 2 -- --     Symptoms SOB, chest/arm pain 4/10 none none     Comments walk test results 1st full day of exercise --     Duration -- Progress to 30 minutes of  aerobic without signs/symptoms of physical distress Continue with 30 min of  aerobic exercise without signs/symptoms of physical distress.     Intensity -- THRR unchanged THRR unchanged       Progression   Progression -- Continue to progress workloads to maintain intensity without signs/symptoms of physical distress. Continue to progress workloads to maintain intensity without signs/symptoms of physical distress.     Average METs -- 2.9 3.1       Resistance Training   Training Prescription -- Yes Yes     Weight -- 4 lb 4 lb     Reps -- 10-15 10-15       Interval Training   Interval Training -- No No       Treadmill   MPH -- 2 2.3     Grade -- 0.5 0.5     Minutes -- 15 15     METs -- 2.64 2.92       Recumbant Bike   Level -- -- 3     Watts -- -- 23     Minutes -- -- 15     METs -- -- 2.91       REL-XR   Level -- 4 3     Minutes -- 15 15     METs -- -- 4.5       Oxygen   Maintain Oxygen Saturation -- -- 88% or higher              Exercise Comments:   Exercise Comments     Row Name 02/06/22 0907 02/18/22 0829         Exercise Comments First full day of exercise!  Patient was oriented to gym and equipment including functions, settings, policies, and procedures.  Patient's individual exercise prescription and treatment plan were reviewed.  All starting workloads were established based on the results of the 6 minute walk test done at initial orientation visit.  The plan for exercise progression was also introduced and progression will be customized based on patient's performance and goals. Fell in yard while doing yardwork, few scratches,no other injury               Exercise Goals and Review:   Exercise Goals     Row Name 02/05/22 1220             Exercise Goals   Increase Physical Activity Yes       Intervention Provide advice, education, support and counseling about physical activity/exercise needs.;Develop an individualized exercise prescription for aerobic and resistive training based on initial evaluation findings, risk  stratification, comorbidities and participant's personal goals.       Expected Outcomes Short Term: Attend rehab on a regular basis to increase amount of physical activity.;Long Term: Exercising regularly at least 3-5 days a week.;Long Term: Add in home exercise to make exercise part of routine and to increase amount of physical activity.  Increase Strength and Stamina Yes       Intervention Provide advice, education, support and counseling about physical activity/exercise needs.;Develop an individualized exercise prescription for aerobic and resistive training based on initial evaluation findings, risk stratification, comorbidities and participant's personal goals.       Expected Outcomes Short Term: Increase workloads from initial exercise prescription for resistance, speed, and METs.;Short Term: Perform resistance training exercises routinely during rehab and add in resistance training at home;Long Term: Improve cardiorespiratory fitness, muscular endurance and strength as measured by increased METs and functional capacity (6MWT)       Able to understand and use rate of perceived exertion (RPE) scale Yes       Intervention Provide education and explanation on how to use RPE scale       Expected Outcomes Short Term: Able to use RPE daily in rehab to express subjective intensity level;Long Term:  Able to use RPE to guide intensity level when exercising independently       Able to understand and use Dyspnea scale Yes       Intervention Provide education and explanation on how to use Dyspnea scale       Expected Outcomes Long Term: Able to use Dyspnea scale to guide intensity level when exercising independently;Short Term: Able to use Dyspnea scale daily in rehab to express subjective sense of shortness of breath during exertion       Knowledge and understanding of Target Heart Rate Range (THRR) Yes       Intervention Provide education and explanation of THRR including how the numbers were predicted  and where they are located for reference       Expected Outcomes Long Term: Able to use THRR to govern intensity when exercising independently;Short Term: Able to state/look up THRR;Short Term: Able to use daily as guideline for intensity in rehab       Able to check pulse independently Yes       Intervention Provide education and demonstration on how to check pulse in carotid and radial arteries.;Review the importance of being able to check your own pulse for safety during independent exercise       Expected Outcomes Short Term: Able to explain why pulse checking is important during independent exercise;Long Term: Able to check pulse independently and accurately       Understanding of Exercise Prescription Yes       Intervention Provide education, explanation, and written materials on patient's individual exercise prescription       Expected Outcomes Short Term: Able to explain program exercise prescription;Long Term: Able to explain home exercise prescription to exercise independently                Exercise Goals Re-Evaluation :  Exercise Goals Re-Evaluation     Row Name 02/06/22 9211 02/11/22 1544 02/25/22 1340 03/10/22 1603       Exercise Goal Re-Evaluation   Exercise Goals Review Able to understand and use rate of perceived exertion (RPE) scale;Able to understand and use Dyspnea scale;Knowledge and understanding of Target Heart Rate Range (THRR);Understanding of Exercise Prescription Increase Physical Activity;Increase Strength and Stamina;Understanding of Exercise Prescription Increase Physical Activity;Increase Strength and Stamina;Understanding of Exercise Prescription Increase Physical Activity;Increase Strength and Stamina;Understanding of Exercise Prescription    Comments Reviewed RPE scale, THR and program prescription with pt today.  Pt voiced understanding and was given a copy of goals to take home. [place] is off to a good start for the first session he has been here. He  was  able to do his initial exercise prescription plus more. He increased his level on the XR to 4 and also increased his speed to 2.0 mph. We will continue to monitor as he progresses in the program. Mario is doing well in rehab. He recently increased his overall average MET level to 3.1 METs. He also has been walking on the treadmill at a speed of 2.3 mph and an incline of 0.5%. He has tolerated 4 lb hand weights for resistance training as well. We will continue to monitor his progress in the program. Reason has not been to rehab due to having skin issues that were not dry and contained. He was cleared to come back as long as his lessions are dry and crusted, patient aware. We will continue to monitor patient when he resumes back in rehab.    Expected Outcomes Short: Use RPE daily to regulate intensity.  Long: Follow program prescription in THR. Short: Continue to attend rehab and working with machines Long: Continue to build up overall strength and stamina Short: Continue to increase workload on the treadmill. Long: Continue to increase strength and stamina. Short: Return back to rehab with good attendance Long: Continue to increase overall MET level             Discharge Exercise Prescription (Final Exercise Prescription Changes):  Exercise Prescription Changes - 02/25/22 1300       Response to Exercise   Blood Pressure (Admit) 110/58    Blood Pressure (Exercise) 142/62    Blood Pressure (Exit) 102/58    Heart Rate (Admit) 67 bpm    Heart Rate (Exercise) 96 bpm    Heart Rate (Exit) 66 bpm    Rating of Perceived Exertion (Exercise) 13    Symptoms none    Duration Continue with 30 min of aerobic exercise without signs/symptoms of physical distress.    Intensity THRR unchanged      Progression   Progression Continue to progress workloads to maintain intensity without signs/symptoms of physical distress.    Average METs 3.1      Resistance Training   Training Prescription Yes    Weight 4  lb    Reps 10-15      Interval Training   Interval Training No      Treadmill   MPH 2.3    Grade 0.5    Minutes 15    METs 2.92      Recumbant Bike   Level 3    Watts 23    Minutes 15    METs 2.91      REL-XR   Level 3    Minutes 15    METs 4.5      Oxygen   Maintain Oxygen Saturation 88% or higher             Nutrition:  Target Goals: Understanding of nutrition guidelines, daily intake of sodium <153m, cholesterol <2065m calories 30% from fat and 7% or less from saturated fats, daily to have 5 or more servings of fruits and vegetables.  Education: All About Nutrition: -Group instruction provided by verbal, written material, interactive activities, discussions, models, and posters to present general guidelines for heart healthy nutrition including fat, fiber, MyPlate, the role of sodium in heart healthy nutrition, utilization of the nutrition label, and utilization of this knowledge for meal planning. Follow up email sent as well. Written material given at graduation.   Biometrics:  Pre Biometrics - 02/05/22 1221       Pre  Biometrics   Height _0  (1.727 m)    Weight 172 lb 11.2 oz (78.3 kg)    Waist Circumference 38 inches    Hip Circumference 37.25 inches    Waist to Hip Ratio 1.02 %    BMI (Calculated) 26.26    Single Leg Stand 2.9 seconds              Nutrition Therapy Plan and Nutrition Goals:  Nutrition Therapy & Goals - 02/05/22 0859       Nutrition Therapy   Diet Heart healthy, low Na, CKD stg 4 MNT    Drug/Food Interactions Purine/Gout    Protein (specify units) 70-75g    Fiber 28 grams    Whole Grain Foods 3 servings   or less depending on his potassium   Saturated Fats 15 max. grams    Fruits and Vegetables 8 servings/day    Sodium 1.5 grams      Personal Nutrition Goals   Nutrition Goal ST: inclde, but limit high potassium/phosphorus containing foods. Review paperwork. identify high sources of sodium in the foods he eats  regularly.  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    Comments 76 y.o. M admitted to cardiac rehab s/p PTCA. PMHx includes CAD, chronic CHF, CKD stage 4, HTN, HLD, AFib, PVD, CHB with pacemaker, arthritis, gout, lupus, MI (2018), skin cancer. PSHx includes CABG (1988, 1996), stent (2021). Relevant medications includes furosemide, prednisone, bactrim. Recent labs: Basic metabolic panel: BUN 76, creatinine 2.73, GFR 23, BUN/creatinine ratio ratio 28. Conrado reports no gout flare-ups in decades, provided handout for his reference if he were to have a flare up again. He has been working with a neprhologist to manage his CKD stg 4 which he reports having since 1992. Discussed considerations for CKD including sodium, potassium, and phosphorus; his last blood result showed his potassium was on the high end of the normal range - recommended limiting high sources of potassium for the day, provided list. Tamika is nervous today as he has been having issues with edema and feels it began when he started the prednisone which his MD is tapering him off of - encouraged him to connect with his MD regarding this as the increased lasix has not been taking off his fluid. We discussed sodium recommendations, food label reading, and sources that are higher in sodium. He reports being hungrier than usual due to the prednisone so he has been having  B: bacon 3x/week, fruits (blueberries, bananas, and dates) with a fiber rich cereal like grape nuts and  2% milk . He will usually have some OJ as well as coffee. S: english muffin with PB and jelly L: home made tuna or chicken salad sandwich with lettuce D: Chicken, fish. Steamed vegetables ( he buys vegetables fresh). He does not usually have them with any carbohydrates aside from some rice. Drinks: sipping water during the day to help reduce swelling. He reports using greek seasoning as well as  another seasoning blend. He uses olive oil and some salt from a grinder. He reports using whole grain bread most of the time. 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 He feels that his sodium may be coming from the seasoning blends so he will check those when he gets home; suggested that if he uses it often, he could make his own seasoning blend without sodium - Chee reported that he would rather look for  one lower in sodium.      Intervention Plan   Intervention Prescribe, educate and counsel regarding individualized specific dietary modifications aiming towards targeted core components such as weight, hypertension, lipid management, diabetes, heart failure and other comorbidities.    Expected Outcomes Short Term Goal: Understand basic principles of dietary content, such as calories, fat, sodium, cholesterol and nutrients.;Long Term Goal: Adherence to prescribed nutrition plan.;Short Term Goal: A plan has been developed with personal nutrition goals set during dietitian appointment.             Nutrition Assessments:  MEDIFICTS Score Key: ?70 Need to make dietary changes  40-70 Heart Healthy Diet ? 40 Therapeutic Level Cholesterol Diet  Flowsheet Row Cardiac Rehab from 02/05/2022 in [facility] Cardiac and Pulmonary Rehab  Picture Your Plate Total Score on Admission 72      Picture Your Plate Scores: <17 Unhealthy dietary pattern with much room for improvement. 41-50 Dietary pattern unlikely to meet recommendations for good health and room for improvement. 51-60 More healthful dietary pattern, with some room for improvement.  >60 Healthy dietary pattern, although there may be some specific behaviors that could be improved.    Nutrition Goals Re-Evaluation:   Nutrition Goals Discharge (Final Nutrition Goals Re-Evaluation):   Psychosocial: Target Goals: Acknowledge presence or absence of significant depression and/or stress, maximize coping skills, provide positive  support system. Participant is able to verbalize types and ability to use techniques and skills needed for reducing stress and depression.   Education: Stress, Anxiety, and Depression - Group verbal and visual presentation to define topics covered.  Reviews how body is impacted by stress, anxiety, and depression.  Also discusses healthy ways to reduce stress and to treat/manage anxiety and depression.  Written material given at graduation. Flowsheet Row Cardiac Rehab from 02/20/2022 in [facility] Cardiac and Pulmonary Rehab  Date 02/20/22  Educator [facility]  Instruction Review Code 1- [employer] Understanding       Education: Sleep Hygiene -Provides group verbal and written instruction about how sleep can affect your health.  Define sleep hygiene, discuss sleep cycles and impact of sleep habits. Review good sleep hygiene tips.    Initial Review & Psychosocial Screening:  Initial Psych Review & Screening - 01/29/22 1426       Initial Review   Current issues with None Identified      Family Dynamics   Good Support System? Yes    Comments He states that he does not have many people for support. He has a few kids and one that is close by.      Barriers   Psychosocial barriers to participate in program There are no identifiable barriers or psychosocial needs.;The patient should benefit from training in stress management and relaxation.      Screening Interventions   Interventions To provide support and resources with identified psychosocial needs;Encouraged to exercise;Provide feedback about the scores to participant    Expected Outcomes Short Term goal: Utilizing psychosocial counselor, staff and physician to assist with identification of specific Stressors or current issues interfering with healing process. Setting desired goal for each stressor or current issue identified.;Long Term Goal: Stressors or current issues are controlled or eliminated.;Short Term goal: Identification and review with  participant of any Quality of Life or Depression concerns found by scoring the questionnaire.;Long Term goal: The participant improves quality of Life and PHQ9 Scores as seen by post scores and/or verbalization of changes             Quality of  Life Scores:   Quality of Life - 02/05/22 1221       Quality of Life   Select Quality of Life      Quality of Life Scores   Health/Function Pre 14.37 %    Socioeconomic Pre 21.75 %    Psych/Spiritual Pre 21.21 %    Family Pre 24.3 %    GLOBAL Pre 18.14 %            Scores of 19 and below usually indicate a poorer quality of life in these areas.  A difference of  2-3 points is a clinically meaningful difference.  A difference of 2-3 points in the total score of the Quality of Life Index has been associated with significant improvement in overall quality of life, self-image, physical symptoms, and general health in studies assessing change in quality of life.  PHQ-9: Review Flowsheet       02/05/2022 08/03/2017 07/01/2017  Depression screen PHQ 2/9  Decreased Interest 0 0 0  Down, Depressed, Hopeless 0 0 0  PHQ - 2 Score 0 0 0  Altered sleeping 3 - -  Tired, decreased energy 3 - -  Change in appetite 0 - -  Feeling bad or failure about yourself  0 - -  Trouble concentrating 0 - -  Moving slowly or fidgety/restless 0 - -  Suicidal thoughts 0 - -  PHQ-9 Score 6 - -  Difficult doing work/chores Somewhat difficult - -   Interpretation of Total Score  Total Score Depression Severity:  1-4 = Minimal depression, 5-9 = Mild depression, 10-14 = Moderate depression, 15-19 = Moderately severe depression, 20-27 = Severe depression   Psychosocial Evaluation and Intervention:  Psychosocial Evaluation - 01/29/22 1427       Psychosocial Evaluation & Interventions   Interventions Encouraged to exercise with the program and follow exercise prescription;Relaxation education;Stress management education    Comments He states that he does not  have many people for support. He has a few kids and one that is close by.    Expected Outcomes Short: Start [facility] to help with mood. Long: Maintain a healthy mental state    Continue Psychosocial Services  Follow up required by staff             Psychosocial Re-Evaluation:   Psychosocial Discharge (Final Psychosocial Re-Evaluation):   Vocational Rehabilitation: Provide vocational rehab assistance to qualifying candidates.   Vocational Rehab Evaluation & Intervention:   Education: Education Goals: Education classes will be provided on a variety of topics geared toward better understanding of heart health and risk factor modification. Participant will state understanding/return demonstration of topics presented as noted by education test scores.  Learning Barriers/Preferences:  Learning Barriers/Preferences - 01/29/22 1425       Learning Barriers/Preferences   Learning Barriers Sight    Learning Preferences Skilled Demonstration;Video             General Cardiac Education Topics:  AED/CPR: - Group verbal and written instruction with the use of models to demonstrate the basic use of the AED with the basic ABC's of resuscitation.   Anatomy and Cardiac Procedures: - Group verbal and visual presentation and models provide information about basic cardiac anatomy and function. Reviews the testing methods done to diagnose heart disease and the outcomes of the test results. Describes the treatment choices: Medical Management, Angioplasty, or Coronary Bypass Surgery for treating various heart conditions including Myocardial Infarction, Angina, Valve Disease, and Cardiac Arrhythmias.  Written material given at graduation. Flowsheet  Row Cardiac Rehab from 02/20/2022 in [facility] Cardiac and Pulmonary Rehab  Education need identified 02/05/22       Medication Safety: - Group verbal and visual instruction to review commonly prescribed medications for heart and lung disease.  Reviews the medication, class of the drug, and side effects. Includes the steps to properly store meds and maintain the prescription regimen.  Written material given at graduation.   Intimacy: - Group verbal instruction through game format to discuss how heart and lung disease can affect sexual intimacy. Written material given at graduation..   Know Your Numbers and Heart Failure: - Group verbal and visual instruction to discuss disease risk factors for cardiac and pulmonary disease and treatment options.  Reviews associated critical values for Overweight/Obesity, Hypertension, Cholesterol, and Diabetes.  Discusses basics of heart failure: signs/symptoms and treatments.  Introduces Heart Failure Zone chart for action plan for heart failure.  Written material given at graduation. Flowsheet Row Cardiac Rehab from 02/20/2022 in [facility] Cardiac and Pulmonary Rehab  Date 02/06/22  Educator SB  Instruction Review Code 1- Verbalizes Understanding       Infection Prevention: - Provides verbal and written material to individual with discussion of infection control including proper hand washing and proper equipment cleaning during exercise session. Flowsheet Row Cardiac Rehab from 02/20/2022 in [facility] Cardiac and Pulmonary Rehab  Date 01/29/22  Educator [facility]  Instruction Review Code 1- Verbalizes Understanding       Falls Prevention: - Provides verbal and written material to individual with discussion of falls prevention and safety. Flowsheet Row Cardiac Rehab from 02/20/2022 in [facility] Cardiac and Pulmonary Rehab  Date 01/29/22  Educator [facility]  Instruction Review Code 1- Verbalizes Understanding       Other: -Provides group and verbal instruction on various topics (see comments)   Knowledge Questionnaire Score:   Core Components/Risk Factors/Patient Goals at Admission:  Personal Goals and Risk Factors at Admission - 02/05/22 1224       Core Components/Risk Factors/Patient Goals on Admission     Weight Management Yes;Obesity    Intervention Weight Management: Develop a combined nutrition and exercise program designed to reach desired caloric intake, while maintaining appropriate intake of nutrient and fiber, sodium and fats, and appropriate energy expenditure required for the weight goal.;Weight Management: Provide education and appropriate resources to help participant work on and attain dietary goals.;Weight Management/Obesity: Establish reasonable short term and long term weight goals.;Obesity: Provide education and appropriate resources to help participant work on and attain dietary goals.    Admit Weight 172 lb 11.2 oz (78.3 kg)    Goal Weight: Short Term 168 lb (76.2 kg)    Goal Weight: Long Term 160 lb (72.6 kg)    Expected Outcomes Short Term: Continue to assess and modify interventions until short term weight is achieved;Long Term: Adherence to nutrition and physical activity/exercise program aimed toward attainment of established weight goal;Weight Loss: Understanding of general recommendations for a balanced deficit meal plan, which promotes 1-2 lb weight loss per week and includes a negative energy balance of [phone] kcal/d;Understanding recommendations for meals to include 15-35% energy as protein, 25-35% energy from fat, 35-60% energy from carbohydrates, less than 222m of dietary cholesterol, 20-35 gm of total fiber daily;Understanding of distribution of calorie intake throughout the day with the consumption of 4-5 meals/snacks    Heart Failure Yes    Intervention Provide a combined exercise and nutrition program that is supplemented with education, support and counseling about heart failure. Directed toward relieving symptoms such as  shortness of breath, decreased exercise tolerance, and extremity edema.    Expected Outcomes Improve functional capacity of life;Short term: Attendance in program 2-3 days a week with increased exercise capacity. Reported lower sodium intake.  Reported increased fruit and vegetable intake. Reports medication compliance.;Short term: Daily weights obtained and reported for increase. Utilizing diuretic protocols set by physician.;Long term: Adoption of self-care skills and reduction of barriers for early signs and symptoms recognition and intervention leading to self-care maintenance.    Hypertension Yes    Intervention Provide education on lifestyle modifcations including regular physical activity/exercise, weight management, moderate sodium restriction and increased consumption of fresh fruit, vegetables, and low fat dairy, alcohol moderation, and smoking cessation.;Monitor prescription use compliance.    Expected Outcomes Long Term: Maintenance of blood pressure at goal levels.;Short Term: Continued assessment and intervention until BP is < 140/42m HG in hypertensive participants. < 130/837mHG in hypertensive participants with diabetes, heart failure or chronic kidney disease.    Lipids Yes    Intervention Provide education and support for participant on nutrition & aerobic/resistive exercise along with prescribed medications to achieve LDL <7058mHDL >79m52m  Expected Outcomes Short Term: Participant states understanding of desired cholesterol values and is compliant with medications prescribed. Participant is following exercise prescription and nutrition guidelines.;Long Term: Cholesterol controlled with medications as prescribed, with individualized exercise RX and with personalized nutrition plan. Value goals: LDL < 70mg50mL > 40 mg.             Education:Diabetes - Individual verbal and written instruction to review signs/symptoms of diabetes, desired ranges of glucose level fasting, after meals and with exercise. Acknowledge that pre and post exercise glucose checks will be done for 3 sessions at entry of program.   Core Components/Risk Factors/Patient Goals Review:    Core Components/Risk Factors/Patient Goals at Discharge  (Final Review):    ITP Comments:  ITP Comments     Row Name 01/29/22 1422 02/05/22 1215 02/06/22 0906 02/18/22 0828 02/26/22 0955   ITP Comments Virtual Visit completed. Patient informed on EP and RD appointment and 6 Minute walk test. Patient also informed of patient health questionnaires on My Chart. Patient Verbalizes understanding. Visit diagnosis can be found in [facility]. Completed 6MWT and gym orientation. Initial ITP created and sent for review to Dr. Mark Emily Filbertical Director. First full day of exercise!  Patient was oriented to gym and equipment including functions, settings, policies, and procedures.  Patient's individual exercise prescription and treatment plan were reviewed.  All starting workloads were established based on the results of the 6 minute walk test done at initial orientation visit.  The plan for exercise progression was also introduced and progression will be customized based on patient's performance and goals. Fell in yard while doing yardwork, few scratches,no other injury 30 Day review completed. Medical Director ITP review done, changes made as directed, and signed approval by Medical Director.    Row [place] 03/11/22 1517 03/24/22 1358 03/25/22 1610 03/26/22 0826     ITP Comments Patient called to let us kn[place] he will not be in this week as he had a new diagnosis (could not say what), unclear, but has doctor appts this week to follow up. Cardiologist appt on 11/16. Patient appts will be taken out this week and will call us onKoreahursday to give us a Koreatter explanation on what is going on. Provider notes from today, 11/14 are not complete yet. Spoke with patient, saw his cardiologist for a "black out"  period in which he was told he was dehydrated. States his skin is all cleared up. No other issues. Patient to return thursday 11/30. Unableto complete goals at this time due to lack of attendance with Dr. Kendrick Fries and skin issues that were not safe for rehab. Patient is better  and call cleared up Patient out since 10/31. 30 Day review completed. Medical Director ITP review done, changes made as directed, and signed approval by Medical Director.             Comments:

## 2022-03-26 NOTE — Progress Notes (Signed)
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 See scanned report under media.

## 2022-03-27 ENCOUNTER — Encounter: Payer: Medicare HMO | Admitting: *Deleted

## 2022-03-27 DIAGNOSIS — Z9861 Coronary angioplasty status: Secondary | ICD-10-CM | POA: Diagnosis not present

## 2022-03-27 LAB — BASIC METABOLIC PANEL
BUN/Creatinine Ratio: 21 (ref 10–24)
BUN: 47 mg/dL — ABNORMAL HIGH (ref 8–27)
CO2: 25 mmol/L (ref 20–29)
Calcium: 9.2 mg/dL (ref 8.6–10.2)
Chloride: 101 mmol/L (ref 96–106)
Creatinine, Ser: 2.22 mg/dL — ABNORMAL HIGH (ref 0.76–1.27)
Glucose: 135 mg/dL — ABNORMAL HIGH (ref 70–99)
Potassium: 4.9 mmol/L (ref 3.5–5.2)
Sodium: 141 mmol/L (ref 134–144)
eGFR: 30 mL/min/{1.73_m2} — ABNORMAL LOW (ref >59)

## 2022-03-27 NOTE — Progress Notes (Signed)
Daily Session Note  Patient Details  Name: Kyle Avila MRN: [id number] Date of Birth: 10-16-1945 Referring Provider:   Flowsheet Row Cardiac Rehab from 02/05/2022 in [facility] Cardiac and Pulmonary Rehab  Referring Provider [name] MD       Encounter Date: 03/27/2022  Check In:  Session Check In - 03/27/22 0733       Check-In   Supervising physician immediately available to respond to emergencies See telemetry face sheet for immediately available ER MD    Location [facility]-Cardiac & Pulmonary Rehab   Andy Gauss, RN   Staff Present Alberteen Sam, MA, RCEP, CCRP, CCET;Joseph Hatfield, RCP,RRT,BSRT;Other;Susanne Bice, RN, BSN, CCRP    Virtual Visit No    Medication changes reported     No    Fall or balance concerns reported    No    Warm-up and Cool-down Performed on first and last piece of equipment    Resistance Training Performed Yes    VAD Patient? No    PAD/SET Patient? No      Pain Assessment   Currently in Pain? No/denies                Social History   Tobacco Use  Smoking Status Never   Passive exposure: Yes  Smokeless Tobacco Never  Tobacco Comments   Never smoke 10/21/21    Goals Met:  Independence with exercise equipment Exercise tolerated well No report of concerns or symptoms today Strength training completed today  Goals Unmet:  Not Applicable  Comments: Pt able to follow exercise prescription today without complaint.  Will continue to monitor for progression.     Dr. Emily Filbert is Medical Director for [place].  Dr. Ottie Glazier is Medical Director for [facility] Pulmonary Rehabilitation.

## 2022-03-31 DIAGNOSIS — R809 Proteinuria, unspecified: Secondary | ICD-10-CM | POA: Diagnosis not present

## 2022-03-31 DIAGNOSIS — I5040 Unspecified combined systolic (congestive) and diastolic (congestive) heart failure: Secondary | ICD-10-CM | POA: Diagnosis not present

## 2022-03-31 DIAGNOSIS — N2581 Secondary hyperparathyroidism of renal origin: Secondary | ICD-10-CM | POA: Diagnosis not present

## 2022-03-31 DIAGNOSIS — N184 Chronic kidney disease, stage 4 (severe): Secondary | ICD-10-CM | POA: Diagnosis not present

## 2022-03-31 DIAGNOSIS — R0902 Hypoxemia: Secondary | ICD-10-CM | POA: Diagnosis not present

## 2022-03-31 DIAGNOSIS — E875 Hyperkalemia: Secondary | ICD-10-CM | POA: Diagnosis not present

## 2022-03-31 DIAGNOSIS — I4811 Longstanding persistent atrial fibrillation: Secondary | ICD-10-CM | POA: Diagnosis not present

## 2022-03-31 DIAGNOSIS — I130 Hypertensive heart and chronic kidney disease with heart failure and stage 1 through stage 4 chronic kidney disease, or unspecified chronic kidney disease: Secondary | ICD-10-CM | POA: Diagnosis not present

## 2022-04-01 ENCOUNTER — Encounter: Payer: Medicare HMO | Attending: Cardiovascular Disease | Admitting: *Deleted

## 2022-04-01 DIAGNOSIS — Z9861 Coronary angioplasty status: Secondary | ICD-10-CM | POA: Diagnosis not present

## 2022-04-01 DIAGNOSIS — Z48812 Encounter for surgical aftercare following surgery on the circulatory system: Secondary | ICD-10-CM | POA: Insufficient documentation

## 2022-04-01 NOTE — Progress Notes (Signed)
Daily Session Note  Patient Details  Name: Kyle Avila MRN: [id number] Date of Birth: 12-12-1945 Referring Provider:   Flowsheet Row Cardiac Rehab from 02/05/2022 in [facility] Cardiac and Pulmonary Rehab  Referring Provider [name] MD       Encounter Date: 04/01/2022  Check In:  Session Check In - 04/01/22 0841       Check-In   Supervising physician immediately available to respond to emergencies See telemetry face sheet for immediately available ER MD    Location [facility]-Cardiac & Pulmonary Rehab    Staff Present Heath Lark, RN, BSN, CCRP;Jessica [place], MA, RCEP, CCRP, Bertram Gala, MS, ACSM CEP, Exercise Physiologist    Virtual Visit No    Medication changes reported     No    Fall or balance concerns reported    No    Warm-up and Cool-down Performed on first and last piece of equipment    Resistance Training Performed Yes    VAD Patient? No    PAD/SET Patient? No      Pain Assessment   Currently in Pain? No/denies                Social History   Tobacco Use  Smoking Status Never   Passive exposure: Yes  Smokeless Tobacco Never  Tobacco Comments   Never smoke 10/21/21    Goals Met:  Independence with exercise equipment Exercise tolerated well No report of concerns or symptoms today  Goals Unmet:  Not Applicable  Comments: Pt able to follow exercise prescription today without complaint.  Will continue to monitor for progression.    Dr. Emily Filbert is Medical Director for [place].  Dr. Ottie Glazier is Medical Director for [facility] Pulmonary Rehabilitation.

## 2022-04-02 ENCOUNTER — Encounter: Payer: Medicare HMO | Admitting: *Deleted

## 2022-04-02 DIAGNOSIS — Z48812 Encounter for surgical aftercare following surgery on the circulatory system: Secondary | ICD-10-CM | POA: Diagnosis not present

## 2022-04-02 DIAGNOSIS — Z9861 Coronary angioplasty status: Secondary | ICD-10-CM

## 2022-04-02 NOTE — Progress Notes (Signed)
Daily Session Note  Patient Details  Name: Kyle Avila MRN: [id number] Date of Birth: 21-Dec-1945 Referring Provider:   Flowsheet Row Cardiac Rehab from 02/05/2022 in [facility] Cardiac and Pulmonary Rehab  Referring Provider [name] MD       Encounter Date: 04/02/2022  Check In:  Session Check In - 04/02/22 1158       Check-In   Supervising physician immediately available to respond to emergencies See telemetry face sheet for immediately available ER MD    Location [facility]-Cardiac & Pulmonary Rehab    Staff Present Nyoka [place], RN, BSN, Lauretta Grill, RCP,RRT,BSRT;Noah Tickle, BS, Exercise Physiologist;Laureen Owens Shark, [place], RRT, CPFT    Virtual Visit No    Medication changes reported     No    Fall or balance concerns reported    No    Tobacco Cessation No Change    Warm-up and Cool-down Performed on first and last piece of equipment    Resistance Training Performed Yes    VAD Patient? No    PAD/SET Patient? No      Pain Assessment   Currently in Pain? No/denies                Social History   Tobacco Use  Smoking Status Never   Passive exposure: Yes  Smokeless Tobacco Never  Tobacco Comments   Never smoke 10/21/21    Goals Met:  Independence with exercise equipment Exercise tolerated well No report of concerns or symptoms today  Goals Unmet:  Not Applicable  Comments: Pt able to follow exercise prescription today without complaint.  Will continue to monitor for progression.   Dr. Emily Filbert is Medical Director for [place].  Dr. Ottie Glazier is Medical Director for [facility] Pulmonary Rehabilitation.

## 2022-04-03 ENCOUNTER — Other Ambulatory Visit (HOSPITAL_COMMUNITY): Payer: Self-pay | Admitting: Cardiology

## 2022-04-07 ENCOUNTER — Ambulatory Visit: Payer: Medicare HMO | Attending: Cardiovascular Disease

## 2022-04-07 DIAGNOSIS — I25119 Atherosclerotic heart disease of native coronary artery with unspecified angina pectoris: Secondary | ICD-10-CM | POA: Diagnosis not present

## 2022-04-07 DIAGNOSIS — E782 Mixed hyperlipidemia: Secondary | ICD-10-CM | POA: Diagnosis not present

## 2022-04-07 DIAGNOSIS — I4819 Other persistent atrial fibrillation: Secondary | ICD-10-CM

## 2022-04-07 DIAGNOSIS — I10 Essential (primary) hypertension: Secondary | ICD-10-CM | POA: Diagnosis not present

## 2022-04-07 DIAGNOSIS — N184 Chronic kidney disease, stage 4 (severe): Secondary | ICD-10-CM

## 2022-04-08 ENCOUNTER — Encounter: Payer: Medicare HMO | Admitting: *Deleted

## 2022-04-08 DIAGNOSIS — Z48812 Encounter for surgical aftercare following surgery on the circulatory system: Secondary | ICD-10-CM | POA: Diagnosis not present

## 2022-04-08 DIAGNOSIS — Z9861 Coronary angioplasty status: Secondary | ICD-10-CM | POA: Diagnosis not present

## 2022-04-08 LAB — LIPID PANEL
Chol/HDL Ratio: 1.7 ratio (ref 0.0–5.0)
Cholesterol, Total: 96 mg/dL — ABNORMAL LOW (ref 100–199)
HDL: 57 mg/dL (ref >39)
LDL Chol Calc (NIH): 15 mg/dL (ref 0–99)
Triglycerides: 145 mg/dL (ref 0–149)
VLDL Cholesterol Cal: 24 mg/dL (ref 5–40)

## 2022-04-08 LAB — COMPREHENSIVE METABOLIC PANEL
ALT: 47 IU/L — ABNORMAL HIGH (ref 0–44)
AST: 26 IU/L (ref 0–40)
Albumin/Globulin Ratio: 2.3 — ABNORMAL HIGH (ref 1.2–2.2)
Albumin: 3.7 g/dL — ABNORMAL LOW (ref 3.8–4.8)
Alkaline Phosphatase: 69 IU/L (ref 44–121)
BUN/Creatinine Ratio: 17 (ref 10–24)
BUN: 41 mg/dL — ABNORMAL HIGH (ref 8–27)
Bilirubin Total: 0.4 mg/dL (ref 0.0–1.2)
CO2: 26 mmol/L (ref 20–29)
Calcium: 9.1 mg/dL (ref 8.6–10.2)
Chloride: 105 mmol/L (ref 96–106)
Creatinine, Ser: 2.47 mg/dL — ABNORMAL HIGH (ref 0.76–1.27)
Globulin, Total: 1.6 g/dL (ref 1.5–4.5)
Glucose: 92 mg/dL (ref 70–99)
Potassium: 4.2 mmol/L (ref 3.5–5.2)
Sodium: 143 mmol/L (ref 134–144)
Total Protein: 5.3 g/dL — ABNORMAL LOW (ref 6.0–8.5)
eGFR: 26 mL/min/{1.73_m2} — ABNORMAL LOW (ref >59)

## 2022-04-08 LAB — CBC
Hematocrit: 40.7 % (ref 37.5–51.0)
Hemoglobin: 13.8 g/dL (ref 13.0–17.7)
MCH: 36.9 pg — ABNORMAL HIGH (ref 26.6–33.0)
MCHC: 33.9 g/dL (ref 31.5–35.7)
MCV: 109 fL — ABNORMAL HIGH (ref 79–97)
Platelets: 169 10*3/uL (ref 150–450)
RBC: 3.74 x10E6/uL — ABNORMAL LOW (ref 4.14–5.80)
RDW: 15 % (ref 11.6–15.4)
WBC: 6.3 10*3/uL (ref 3.4–10.8)

## 2022-04-08 LAB — TSH: TSH: 4.8 u[IU]/mL — ABNORMAL HIGH (ref 0.450–4.500)

## 2022-04-08 NOTE — Progress Notes (Signed)
Daily Session Note  Patient Details  Name: Kyle Avila MRN: [id number] Date of Birth: 26-Feb-1946 Referring Provider:   Flowsheet Row Cardiac Rehab from 02/05/2022 in [facility] Cardiac and Pulmonary Rehab  Referring Provider [name] MD       Encounter Date: 04/08/2022  Check In:  Session Check In - 04/08/22 0811       Check-In   Supervising physician immediately available to respond to emergencies See telemetry face sheet for immediately available ER MD    Location [facility]-Cardiac & Pulmonary Rehab    Staff Present Heath Lark, RN, BSN, CCRP;Jessica [place], MA, RCEP, CCRP, Bertram Gala, MS, ACSM CEP, Exercise Physiologist    Virtual Visit No    Medication changes reported     No    Fall or balance concerns reported    No    Warm-up and Cool-down Performed on first and last piece of equipment    Resistance Training Performed Yes    VAD Patient? No    PAD/SET Patient? No      Pain Assessment   Currently in Pain? No/denies                Social History   Tobacco Use  Smoking Status Never   Passive exposure: Yes  Smokeless Tobacco Never  Tobacco Comments   Never smoke 10/21/21    Goals Met:  Independence with exercise equipment Exercise tolerated well No report of concerns or symptoms today  Goals Unmet:  Not Applicable  Comments: Pt able to follow exercise prescription today without complaint.  Will continue to monitor for progression.    Dr. Emily Filbert is Medical Director for [place].  Dr. Ottie Glazier is Medical Director for [facility] Pulmonary Rehabilitation.

## 2022-04-10 ENCOUNTER — Encounter: Payer: Self-pay | Admitting: Internal Medicine

## 2022-04-10 ENCOUNTER — Encounter: Payer: Medicare HMO | Admitting: *Deleted

## 2022-04-10 ENCOUNTER — Ambulatory Visit: Payer: Medicare HMO | Attending: Internal Medicine | Admitting: Internal Medicine

## 2022-04-10 VITALS — BP 130/68 | HR 60 | Ht 68.0 in | Wt 177.0 lb

## 2022-04-10 DIAGNOSIS — I5022 Chronic systolic (congestive) heart failure: Secondary | ICD-10-CM | POA: Diagnosis not present

## 2022-04-10 DIAGNOSIS — I255 Ischemic cardiomyopathy: Secondary | ICD-10-CM

## 2022-04-10 DIAGNOSIS — I472 Ventricular tachycardia, unspecified: Secondary | ICD-10-CM | POA: Diagnosis not present

## 2022-04-10 DIAGNOSIS — I4819 Other persistent atrial fibrillation: Secondary | ICD-10-CM | POA: Diagnosis not present

## 2022-04-10 DIAGNOSIS — I442 Atrioventricular block, complete: Secondary | ICD-10-CM

## 2022-04-10 NOTE — Progress Notes (Signed)
Incomplete Session Note  Patient Details  Name: Kyle Avila MRN: [id number] Date of Birth: 1946-02-05 Referring Provider:   Flowsheet Row Cardiac Rehab from 02/05/2022 in [facility] Cardiac and Pulmonary Rehab  Referring Provider [name] MD       [name] did not complete his rehab session.  Pt arrived to rehab with inappropriate footwear and was unable to complete today's session.

## 2022-04-10 NOTE — Patient Instructions (Addendum)
Medication Instructions:  Your physician recommends that you continue on your current medications as directed. Please refer to the Current Medication list given to you today.  *If you need a refill on your cardiac medications before your next appointment, please call your pharmacy*   Lab Work: None  If you have labs (blood work) drawn today and your tests are completely normal, you will receive your results only by: [place] (if you have MyChart) OR A paper copy in the mail If you have any lab test that is abnormal or we need to change your treatment, we will call you to review the results.   Testing/Procedures: CRT or cardiac resynchronization therapy is a treatment used to correct heart failure. When you have heart failure your heart is weakened and doesn't pump as well as it should. This therapy may help reduce symptoms and improve the quality of life.  Please see the handout/brochure given to you today to get more information of the different options of therapy.    Follow-Up: At [facility], you and your health needs are our priority.  As part of our continuing mission to provide you with exceptional heart care, we have created designated Provider Care Teams.  These Care Teams include your primary Cardiologist (physician) and Advanced Practice Providers (APPs -  Physician Assistants and Nurse Practitioners) who all work together to provide you with the care you need, when you need it.  We recommend signing up for the patient portal called "MyChart".  Sign up information is provided on this After Visit Summary.  MyChart is used to connect with patients for Virtual Visits (Telemedicine).  Patients are able to view lab/test results, encounter notes, upcoming appointments, etc.  Non-urgent messages can be sent to your provider as well.   To learn more about what you can do with MyChart, go to [email or web address].    Your next appointment:   To be scheduled  Important  Information About Sugar

## 2022-04-10 NOTE — Progress Notes (Signed)
Patient Care Team: Nelva Bush, MD as PCP - General (Cardiology) [name], MD as PCP - Cardiology (Cardiology) Deboraha Sprang, MD as PCP - Electrophysiology (Cardiology) Fleet Contras, MD as Consulting Physician (Nephrology) Deboraha Sprang, MD (Cardiology)   HPI  Kyle Avila is a 76 y.o. 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  His device reached ERI and he underwent generator replacement 5/16.  Long term persistent atrial fibrillation-- sometime recently, he spontaneously reverted to sinus rhythm and has been programmed DDD and has significant exercise intolerance.  On coming to the office for reprogramming he had reverted back to atrial fibrillation.  Started on amiodarone.  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     He has a complex cardiovascular history with bypass surgery and redo bypass in 96 and revascularization with stenting of the circumflex in 2009. At that time he had total LAD and RCA disease; his left ventricular function then was okay.  Complaints of progressive fatigue and exercise intolerance.  Initially I thought it might be related to the fact that he reverted spontaneously to sinus rhythm and had chronotropic incompetence and had a fixed rate of 60; however, with reversion to atrial fibrillation and recurrent   of vent rate response there was no improvement. \ Echo shows no significant change in LV function.  MR may be somewhat worse than it had been.  Fatigue continues to be an issue.    DATE TEST EF   11/15 Myoview  42 % Anterolateral defect  2/18 Echo   45-50 %   12/18 Cath   S/P CABG patency  LIMA-LAD and moderate stenosis at   anastomosis Stenosis of the SVG-PDA- DESX2  implantation In-stent restenosis and de-novo mid vessel disease  11/23 Echo  45-50% MR mild Mod    He is to stay on long-term dual therapy per Dr. Billee Cashing   CRT upgrade   Past Medical History:  Diagnosis Date    Arthritis    "some in my back" (04/14/2017)   CAD (coronary artery disease)    a.  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; lupus attacked them" (04/14/2017)   Mitral insufficiency    Moderate mitral regurgitation 10/02/2021   Echocardiogram 6/23: EF 26, global HK, mild ASH, mildly reduced RVSF, normal PASP, RVSP 31, mod LAD, mild RAE, mod MR, trivial AI, AV sclerosis w/o stenosis   Myocardial infarction ([place])    "I've had some mild one" (04/14/2017)   Presence of permanent cardiac pacemaker    Skin cancer    "nose"   VT (ventricular tachycardia)-nonsustained 05/20/2011    Past Surgical History:  Procedure Laterality Date   ACHILLES TENDON REPAIR Right    CARDIAC CATHETERIZATION     CARDIAC CATHETERIZATION N/A 11/03/2014   Procedure: Left Heart Cath and Cors/Grafts Angiography;  Surgeon: [name], MD;  Location: [place] [facility];  Service: Cardiovascular;  Laterality: N/A;   CARDIAC CATHETERIZATION N/A 11/03/2014   Procedure: Coronary Stent Intervention;  Surgeon: [name], MD;  Location: [place] [facility];  Service: Cardiovascular;  Laterality: N/A;  CARDIOVERSION N/A 07/17/2021   Procedure: CARDIOVERSION;  Surgeon: [name], MD;  Location: [place];  Service: Cardiovascular;  Laterality: N/A;   CARDIOVERSION N/A 10/08/2021   Procedure: CARDIOVERSION;  Surgeon: Freada Bergeron, MD;  Location: [facility] ENDOSCOPY;  Service: Cardiovascular;  Laterality: N/A;   CAROTID ENDARTERECTOMY  2002   "? side"   CATARACT EXTRACTION W/ INTRAOCULAR LENS IMPLANT     "? side"   CORONARY ANGIOPLASTY WITH STENT PLACEMENT  04/14/2017   CORONARY ANGIOPLASTY WITH STENT PLACEMENT  11/03/2014   SVG PVA   CORONARY ARTERY BYPASS GRAFT      with redo cabg   CORONARY BALLOON ANGIOPLASTY N/A 08/15/2021   Procedure: CORONARY BALLOON ANGIOPLASTY;  Surgeon: Burnell Blanks, MD;  Location: [place] [facility];  Service: Cardiovascular;  Laterality: N/A;   CORONARY STENT INTERVENTION N/A 10/18/2019   Procedure: CORONARY STENT INTERVENTION;  Surgeon: [name], MD;  Location: [place] [facility];  Service: Cardiovascular;  Laterality: N/A;   CORONARY/GRAFT ANGIOGRAPHY N/A 10/18/2019   Procedure: CORONARY/GRAFT ANGIOGRAPHY;  Surgeon: [name], MD;  Location: [place] [facility];  Service: Cardiovascular;  Laterality: N/A;   EP IMPLANTABLE DEVICE N/A 09/13/2014   Procedure: PPM Generator Changeout;  Surgeon: Deboraha Sprang, MD;  Location: [place] [facility];  Service: Cardiovascular;  Laterality: N/A;   LEFT HEART CATH AND CORS/GRAFTS ANGIOGRAPHY N/A 04/14/2017   Procedure: LEFT HEART CATH AND CORS/GRAFTS ANGIOGRAPHY;  Surgeon: [name], MD;  Location: [place] [facility];  Service: Cardiovascular;  Laterality: N/A;   LEFT HEART CATH AND CORS/GRAFTS ANGIOGRAPHY N/A 08/15/2021   Procedure: LEFT HEART CATH AND CORS/GRAFTS ANGIOGRAPHY;  Surgeon: Burnell Blanks, MD;  Location: [place] [facility];  Service: Cardiovascular;  Laterality: N/A;   MOHS SURGERY     "just outside my nose"   PACEMAKER INSERTION  2007   TONSILLECTOMY  1958    Current Outpatient Medications  Medication Sig Dispense Refill   acetaminophen (TYLENOL) 500 MG tablet Take 1,000 mg by mouth every 6 (six) hours as needed for moderate pain.     allopurinol (ZYLOPRIM) 100 MG tablet Take 100 mg by mouth at bedtime.     amLODipine (NORVASC) 2.5 MG tablet Take 1 tablet (2.5 mg total) by mouth daily. 90 tablet 1   apixaban (ELIQUIS) 5 MG TABS tablet Take 1 tablet (5 mg total) by mouth 2 (two) times daily. 180 tablet 2   clopidogrel (PLAVIX) 75 MG tablet Take 75 mg by mouth at bedtime.     empagliflozin (JARDIANCE) 10 MG TABS tablet TAKE 1 TABLET BY MOUTH  DAILY BEFORE BREAKFAST. 90 tablet 2   furosemide (LASIX) 80 MG tablet Take 1 tablet (80 mg total) by mouth daily. 90 tablet 3   isosorbide mononitrate (IMDUR) 120 MG 24 hr tablet TAKE 1 TABLET (120 MG TOTAL) BY MOUTH DAILY. 90 tablet 3   ketoconazole (NIZORAL) 2 % cream Apply 1 application. topically daily as needed (itching toes).     metoprolol succinate (TOPROL-XL) 50 MG 24 hr tablet TAKE 1 TABLET TWO TIMES DAILY. TAKE WITH OR IMMEDIATELY FOLLOWING MEALS. 180 tablet 3   mupirocin ointment (BACTROBAN) 2 % Apply 1 Application topically 3 (three) times daily.     nitroGLYCERIN (NITROSTAT) 0.4 MG SL tablet PLACE 1 TABLET UNDER THE TONGUE EVERY 5 MINUTES AS NEEDED FOR CHEST PAIN 25 tablet 3   predniSONE (DELTASONE) 10 MG tablet Take 1 tablet (10 mg total) by mouth daily with breakfast. 30 tablet 1  predniSONE (DELTASONE) 20 MG tablet Take 30 mg by mouth daily with breakfast.     REPATHA SURECLICK 762 MG/ML SOAJ Inject 140 mg into the skin every 14 (fourteen) days. 6 mL 3   sulfamethoxazole-trimethoprim (BACTRIM) 400-80 MG tablet Take 1 tablet by mouth 3 (three) times a week. Take 1 tablet daily only on Monday, Wednesday, and Friday. 30 tablet 0   tamsulosin (FLOMAX) 0.4 MG CAPS Take 0.4 mg by mouth daily.     No current facility-administered medications for this visit.    Allergies  Allergen Reactions   Amiodarone Other (See Comments)    Lung toxicity     Review of Systems negative except from HPI and PMH  Physical Exam BP 130/68   Pulse 60   Ht '5\' 8"'$  (1.727 m)   Wt 177 lb (80.3 kg)   SpO2 94%   BMI 26.91 kg/m    Well developed and well nourished in no acute distress HENT normal Neck supple with JVP-flat Clear Device pocket well healed; without hematoma or erythema.  There is no tethering  Regular rate and rhythm, no  gallop 2/6 murmur Abd-soft with active BS No Clubbing cyanosis  edema Skin-warm and dry A & Oriented  Grossly normal sensory and motor function  ECG 1 atrial   ibrillation with ventricular pacing at 60 Interval-/19/48  Device function is normal. Programming changes   See Paceart for details    Assessment and  Plan  Ischemic cardiomyopathy with prior MI  Hypertension   Pacemaker-Medtronic    Complete heart block device dependent  PVCs  Sinus node dysfunction  Atrial fibrillation-persistent  Amiodarone question lung toxicity  The patient has progressive exercise intolerance with class III symptoms.  I will talk with Dr. Burt Knack but I think there may be a role for CRT upgrade to improve functional status with his ejection fraction now in the 45% range and moderate MR.  He has a very old right-sided device.  It might be appropriate at that point putting a new left-sided device attempting first left bundle branch area pacing and if not CRT.  The benefits and risks were reviewed including but not limited to death,  perforation, infection, lead dislodgement and device malfunction.  The patient understands agrees and is willing to proceed.  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

## 2022-04-10 NOTE — Progress Notes (Signed)
Remote pacemaker transmission.   

## 2022-04-10 NOTE — Progress Notes (Deleted)
Daily Session Note  Patient Details  Name: Kyle Avila MRN: [id number] Date of Birth: [birth year] Referring Provider:   Flowsheet Row Cardiac Rehab from 02/05/2022 in [facility] Cardiac and Pulmonary Rehab  Referring Provider [name] MD       Encounter Date: 04/10/2022  Check In:  Session Check In - 04/10/22 0734       Check-In   Supervising physician immediately available to respond to emergencies See telemetry face sheet for immediately available ER MD    Location [facility]-Cardiac & Pulmonary Rehab    Staff Present Darlyne Russian, RN, ADN;Joseph Hood, RCP,RRT,BSRT;Jessica Grenora, MA, RCEP, CCRP, CCET    Virtual Visit No    Medication changes reported     No    Fall or balance concerns reported    No    Warm-up and Cool-down Performed on first and last piece of equipment    Resistance Training Performed Yes    VAD Patient? No    PAD/SET Patient? No      Pain Assessment   Currently in Pain? No/denies                Social History   Tobacco Use  Smoking Status Never   Passive exposure: Yes  Smokeless Tobacco Never  Tobacco Comments   Never smoke 10/21/21    Goals Met:  Independence with exercise equipment Exercise tolerated well No report of concerns or symptoms today Strength training completed today  Goals Unmet:  Not Applicable  Comments: Pt able to follow exercise prescription today without complaint.  Will continue to monitor for progression.    Dr. Emily Filbert is Medical Director for [place].  Dr. Ottie Glazier is Medical Director for [facility] Pulmonary Rehabilitation.

## 2022-04-10 NOTE — H&P (View-Only) (Signed)
Patient Care Team: Nelva Bush, MD as PCP - General (Cardiology) [name], MD as PCP - Cardiology (Cardiology) Deboraha Sprang, MD as PCP - Electrophysiology (Cardiology) Fleet Contras, MD as Consulting Physician (Nephrology) Deboraha Sprang, MD (Cardiology)   HPI  Kyle Avila is a 76 y.o. 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  His device reached ERI and he underwent generator replacement 5/16.  Long term persistent atrial fibrillation-- sometime recently, he spontaneously reverted to sinus rhythm and has been programmed DDD and has significant exercise intolerance.  On coming to the office for reprogramming he had reverted back to atrial fibrillation.  Started on amiodarone.  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     He has a complex cardiovascular history with bypass surgery and redo bypass in 96 and revascularization with stenting of the circumflex in 2009. At that time he had total LAD and RCA disease; his left ventricular function then was okay.  Complaints of progressive fatigue and exercise intolerance.  Initially I thought it might be related to the fact that he reverted spontaneously to sinus rhythm and had chronotropic incompetence and had a fixed rate of 60; however, with reversion to atrial fibrillation and recurrent   of vent rate response there was no improvement. \ Echo shows no significant change in LV function.  MR may be somewhat worse than it had been.  Fatigue continues to be an issue.    DATE TEST EF   11/15 Myoview  42 % Anterolateral defect  2/18 Echo   45-50 %   12/18 Cath   S/P CABG patency  LIMA-LAD and moderate stenosis at   anastomosis Stenosis of the SVG-PDA- DESX2  implantation In-stent restenosis and de-novo mid vessel disease  11/23 Echo  45-50% MR mild Mod    He is to stay on long-term dual therapy per Dr. Billee Cashing   CRT upgrade   Past Medical History:  Diagnosis Date    Arthritis    "some in my back" (04/14/2017)   CAD (coronary artery disease)    a.  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; lupus attacked them" (04/14/2017)   Mitral insufficiency    Moderate mitral regurgitation 10/02/2021   Echocardiogram 6/23: EF 12, global HK, mild ASH, mildly reduced RVSF, normal PASP, RVSP 31, mod LAD, mild RAE, mod MR, trivial AI, AV sclerosis w/o stenosis   Myocardial infarction ([place])    "I've had some mild one" (04/14/2017)   Presence of permanent cardiac pacemaker    Skin cancer    "nose"   VT (ventricular tachycardia)-nonsustained 05/20/2011    Past Surgical History:  Procedure Laterality Date   ACHILLES TENDON REPAIR Right    CARDIAC CATHETERIZATION     CARDIAC CATHETERIZATION N/A 11/03/2014   Procedure: Left Heart Cath and Cors/Grafts Angiography;  Surgeon: [name], MD;  Location: [place] [facility];  Service: Cardiovascular;  Laterality: N/A;   CARDIAC CATHETERIZATION N/A 11/03/2014   Procedure: Coronary Stent Intervention;  Surgeon: [name], MD;  Location: [place] [facility];  Service: Cardiovascular;  Laterality: N/A;  CARDIOVERSION N/A 07/17/2021   Procedure: CARDIOVERSION;  Surgeon: [name], MD;  Location: [place];  Service: Cardiovascular;  Laterality: N/A;   CARDIOVERSION N/A 10/08/2021   Procedure: CARDIOVERSION;  Surgeon: Freada Bergeron, MD;  Location: [facility] ENDOSCOPY;  Service: Cardiovascular;  Laterality: N/A;   CAROTID ENDARTERECTOMY  2002   "? side"   CATARACT EXTRACTION W/ INTRAOCULAR LENS IMPLANT     "? side"   CORONARY ANGIOPLASTY WITH STENT PLACEMENT  04/14/2017   CORONARY ANGIOPLASTY WITH STENT PLACEMENT  11/03/2014   SVG PVA   CORONARY ARTERY BYPASS GRAFT      with redo cabg   CORONARY BALLOON ANGIOPLASTY N/A 08/15/2021   Procedure: CORONARY BALLOON ANGIOPLASTY;  Surgeon: Burnell Blanks, MD;  Location: [place] [facility];  Service: Cardiovascular;  Laterality: N/A;   CORONARY STENT INTERVENTION N/A 10/18/2019   Procedure: CORONARY STENT INTERVENTION;  Surgeon: [name], MD;  Location: [place] [facility];  Service: Cardiovascular;  Laterality: N/A;   CORONARY/GRAFT ANGIOGRAPHY N/A 10/18/2019   Procedure: CORONARY/GRAFT ANGIOGRAPHY;  Surgeon: [name], MD;  Location: [place] [facility];  Service: Cardiovascular;  Laterality: N/A;   EP IMPLANTABLE DEVICE N/A 09/13/2014   Procedure: PPM Generator Changeout;  Surgeon: Deboraha Sprang, MD;  Location: [place] [facility];  Service: Cardiovascular;  Laterality: N/A;   LEFT HEART CATH AND CORS/GRAFTS ANGIOGRAPHY N/A 04/14/2017   Procedure: LEFT HEART CATH AND CORS/GRAFTS ANGIOGRAPHY;  Surgeon: [name], MD;  Location: [place] [facility];  Service: Cardiovascular;  Laterality: N/A;   LEFT HEART CATH AND CORS/GRAFTS ANGIOGRAPHY N/A 08/15/2021   Procedure: LEFT HEART CATH AND CORS/GRAFTS ANGIOGRAPHY;  Surgeon: Burnell Blanks, MD;  Location: [place] [facility];  Service: Cardiovascular;  Laterality: N/A;   MOHS SURGERY     "just outside my nose"   PACEMAKER INSERTION  2007   TONSILLECTOMY  1958    Current Outpatient Medications  Medication Sig Dispense Refill   acetaminophen (TYLENOL) 500 MG tablet Take 1,000 mg by mouth every 6 (six) hours as needed for moderate pain.     allopurinol (ZYLOPRIM) 100 MG tablet Take 100 mg by mouth at bedtime.     amLODipine (NORVASC) 2.5 MG tablet Take 1 tablet (2.5 mg total) by mouth daily. 90 tablet 1   apixaban (ELIQUIS) 5 MG TABS tablet Take 1 tablet (5 mg total) by mouth 2 (two) times daily. 180 tablet 2   clopidogrel (PLAVIX) 75 MG tablet Take 75 mg by mouth at bedtime.     empagliflozin (JARDIANCE) 10 MG TABS tablet TAKE 1 TABLET BY MOUTH  DAILY BEFORE BREAKFAST. 90 tablet 2   furosemide (LASIX) 80 MG tablet Take 1 tablet (80 mg total) by mouth daily. 90 tablet 3   isosorbide mononitrate (IMDUR) 120 MG 24 hr tablet TAKE 1 TABLET (120 MG TOTAL) BY MOUTH DAILY. 90 tablet 3   ketoconazole (NIZORAL) 2 % cream Apply 1 application. topically daily as needed (itching toes).     metoprolol succinate (TOPROL-XL) 50 MG 24 hr tablet TAKE 1 TABLET TWO TIMES DAILY. TAKE WITH OR IMMEDIATELY FOLLOWING MEALS. 180 tablet 3   mupirocin ointment (BACTROBAN) 2 % Apply 1 Application topically 3 (three) times daily.     nitroGLYCERIN (NITROSTAT) 0.4 MG SL tablet PLACE 1 TABLET UNDER THE TONGUE EVERY 5 MINUTES AS NEEDED FOR CHEST PAIN 25 tablet 3   predniSONE (DELTASONE) 10 MG tablet Take 1 tablet (10 mg total) by mouth daily with breakfast. 30 tablet 1  predniSONE (DELTASONE) 20 MG tablet Take 30 mg by mouth daily with breakfast.     REPATHA SURECLICK 976 MG/ML SOAJ Inject 140 mg into the skin every 14 (fourteen) days. 6 mL 3   sulfamethoxazole-trimethoprim (BACTRIM) 400-80 MG tablet Take 1 tablet by mouth 3 (three) times a week. Take 1 tablet daily only on Monday, Wednesday, and Friday. 30 tablet 0   tamsulosin (FLOMAX) 0.4 MG CAPS Take 0.4 mg by mouth daily.     No current facility-administered medications for this visit.    Allergies  Allergen Reactions   Amiodarone Other (See Comments)    Lung toxicity     Review of Systems negative except from HPI and PMH  Physical Exam BP 130/68   Pulse 60   Ht '5\' 8"'$  (1.727 m)   Wt 177 lb (80.3 kg)   SpO2 94%   BMI 26.91 kg/m    Well developed and well nourished in no acute distress HENT normal Neck supple with JVP-flat Clear Device pocket well healed; without hematoma or erythema.  There is no tethering  Regular rate and rhythm, no  gallop 2/6 murmur Abd-soft with active BS No Clubbing cyanosis  edema Skin-warm and dry A & Oriented  Grossly normal sensory and motor function  ECG 1 atrial   ibrillation with ventricular pacing at 60 Interval-/19/48  Device function is normal. Programming changes   See Paceart for details    Assessment and  Plan  Ischemic cardiomyopathy with prior MI  Hypertension   Pacemaker-Medtronic    Complete heart block device dependent  PVCs  Sinus node dysfunction  Atrial fibrillation-persistent  Amiodarone question lung toxicity  The patient has progressive exercise intolerance with class III symptoms.  I will talk with Dr. Burt Knack but I think there may be a role for CRT upgrade to improve functional status with his ejection fraction now in the 45% range and moderate MR.  He has a very old right-sided device.  It might be appropriate at that point putting a new left-sided device attempting first left bundle branch area pacing and if not CRT.  The benefits and risks were reviewed including but not limited to death,  perforation, infection, lead dislodgement and device malfunction.  The patient understands agrees and is willing to proceed.  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

## 2022-04-11 ENCOUNTER — Encounter: Payer: Self-pay | Admitting: Internal Medicine

## 2022-04-11 ENCOUNTER — Encounter: Payer: Self-pay | Admitting: Cardiovascular Disease

## 2022-04-11 ENCOUNTER — Encounter: Payer: Medicare HMO | Admitting: *Deleted

## 2022-04-11 DIAGNOSIS — Z9861 Coronary angioplasty status: Secondary | ICD-10-CM | POA: Diagnosis not present

## 2022-04-11 DIAGNOSIS — Z48812 Encounter for surgical aftercare following surgery on the circulatory system: Secondary | ICD-10-CM | POA: Diagnosis not present

## 2022-04-11 NOTE — Progress Notes (Signed)
Daily Session Note  Patient Details  Name: Kyle Avila MRN: [id number] Date of Birth: 1945/05/22 Referring Provider:   Flowsheet Row Cardiac Rehab from 02/05/2022 in [facility] Cardiac and Pulmonary Rehab  Referring Provider [name] MD       Encounter Date: 04/11/2022  Check In:  Session Check In - 04/11/22 0846       Check-In   Supervising physician immediately available to respond to emergencies See telemetry face sheet for immediately available ER MD    Location [facility]-Cardiac & Pulmonary Rehab    Staff Present Heath Lark, RN, BSN, CCRP;Jessica [place], MA, RCEP, CCRP, CCET;Joseph [place], [place]    Virtual Visit No    Medication changes reported     No    Fall or balance concerns reported    No    Warm-up and Cool-down Performed on first and last piece of equipment    Resistance Training Performed Yes    VAD Patient? No    PAD/SET Patient? No      Pain Assessment   Currently in Pain? No/denies                Social History   Tobacco Use  Smoking Status Never   Passive exposure: Yes  Smokeless Tobacco Never  Tobacco Comments   Never smoke 10/21/21    Goals Met:  Independence with exercise equipment Exercise tolerated well No report of concerns or symptoms today  Goals Unmet:  Not Applicable  Comments: Pt able to follow exercise prescription today without complaint.  Will continue to monitor for progression.    Dr. Emily Filbert is Medical Director for [place].  Dr. Ottie Glazier is Medical Director for [facility] Pulmonary Rehabilitation.

## 2022-04-14 ENCOUNTER — Ambulatory Visit: Payer: Medicare HMO | Admitting: Cardiovascular Disease

## 2022-04-15 ENCOUNTER — Encounter: Payer: Medicare HMO | Admitting: *Deleted

## 2022-04-15 DIAGNOSIS — Z9861 Coronary angioplasty status: Secondary | ICD-10-CM | POA: Diagnosis not present

## 2022-04-15 DIAGNOSIS — Z48812 Encounter for surgical aftercare following surgery on the circulatory system: Secondary | ICD-10-CM | POA: Diagnosis not present

## 2022-04-15 NOTE — Progress Notes (Signed)
Daily Session Note  Patient Details  Name: Kyle Avila MRN: [id number] Date of Birth: 1945/05/02 Referring Provider:   Flowsheet Row Cardiac Rehab from 02/05/2022 in [facility] Cardiac and Pulmonary Rehab  Referring Provider [name] MD       Encounter Date: 04/15/2022  Check In:  Session Check In - 04/15/22 0816       Check-In   Supervising physician immediately available to respond to emergencies See telemetry face sheet for immediately available ER MD    Location [facility]-Cardiac & Pulmonary Rehab    Staff Present Heath Lark, RN, BSN, CCRP;Jessica [place], MA, RCEP, CCRP, Bertram Gala, MS, ACSM CEP, Exercise Physiologist    Virtual Visit No    Medication changes reported     No    Fall or balance concerns reported    No    Warm-up and Cool-down Performed on first and last piece of equipment    Resistance Training Performed Yes    VAD Patient? No    PAD/SET Patient? No      Pain Assessment   Currently in Pain? No/denies                Social History   Tobacco Use  Smoking Status Never   Passive exposure: Yes  Smokeless Tobacco Never  Tobacco Comments   Never smoke 10/21/21    Goals Met:  Independence with exercise equipment Exercise tolerated well No report of concerns or symptoms today  Goals Unmet:  Not Applicable  Comments: Pt able to follow exercise prescription today without complaint.  Will continue to monitor for progression.    Dr. Emily Filbert is Medical Director for [place].  Dr. Ottie Glazier is Medical Director for [facility] Pulmonary Rehabilitation.

## 2022-04-16 ENCOUNTER — Encounter: Payer: Self-pay | Admitting: Internal Medicine

## 2022-04-17 ENCOUNTER — Encounter: Payer: Medicare HMO | Admitting: *Deleted

## 2022-04-17 ENCOUNTER — Other Ambulatory Visit (HOSPITAL_COMMUNITY): Payer: Self-pay | Admitting: Cardiology

## 2022-04-17 DIAGNOSIS — Z9861 Coronary angioplasty status: Secondary | ICD-10-CM

## 2022-04-17 DIAGNOSIS — Z48812 Encounter for surgical aftercare following surgery on the circulatory system: Secondary | ICD-10-CM | POA: Diagnosis not present

## 2022-04-17 NOTE — Progress Notes (Signed)
Daily Session Note  Patient Details  Name: Kyle Avila MRN: [id number] Date of Birth: 03/17/1946 Referring Provider:   Flowsheet Row Cardiac Rehab from 02/05/2022 in [facility] Cardiac and Pulmonary Rehab  Referring Provider [name] MD       Encounter Date: 04/17/2022  Check In:  Session Check In - 04/17/22 0742       Check-In   Supervising physician immediately available to respond to emergencies See telemetry face sheet for immediately available ER MD    Location [facility]-Cardiac & Pulmonary Rehab    Staff Present Heath Lark, RN, BSN, CCRP;Joseph [place], [place]    Virtual Visit No    Medication changes reported     No    Fall or balance concerns reported    No    Warm-up and Cool-down Performed on first and last piece of equipment    Resistance Training Performed Yes    VAD Patient? No    PAD/SET Patient? No      Pain Assessment   Currently in Pain? No/denies                Social History   Tobacco Use  Smoking Status Never   Passive exposure: Yes  Smokeless Tobacco Never  Tobacco Comments   Never smoke 10/21/21    Goals Met:  Independence with exercise equipment Exercise tolerated well No report of concerns or symptoms today  Goals Unmet:  Not Applicable  Comments: Pt able to follow exercise prescription today without complaint.  Will continue to monitor for progression.    Dr. Emily Filbert is Medical Director for [place].  Dr. Ottie Glazier is Medical Director for [facility] Pulmonary Rehabilitation.

## 2022-04-18 ENCOUNTER — Ambulatory Visit (HOSPITAL_COMMUNITY)
Admission: RE | Admit: 2022-04-18 | Discharge: 2022-04-18 | Disposition: A | Discharge status: Home / Self Care | Payer: Medicare HMO | Source: Ambulatory Visit | Attending: Cardiology | Admitting: Cardiology

## 2022-04-18 DIAGNOSIS — I6521 Occlusion and stenosis of right carotid artery: Secondary | ICD-10-CM | POA: Diagnosis not present

## 2022-04-18 DIAGNOSIS — Z9889 Other specified postprocedural states: Secondary | ICD-10-CM | POA: Insufficient documentation

## 2022-04-22 ENCOUNTER — Ambulatory Visit (HOSPITAL_COMMUNITY): Payer: Medicare HMO | Admitting: Physician Assistant

## 2022-04-22 NOTE — Pre-Procedure Instructions (Signed)
Instructed patient on the following items: Arrival time 1100 Nothing to eat or drink after midnight No meds AM of procedure Responsible person to drive you home and stay with you for 24 hrs Wash with special soap night before and morning of procedure If on anti-coagulant drug instructions Eliquis- -stopped 04/21/22

## 2022-04-23 ENCOUNTER — Encounter: Payer: Self-pay | Admitting: *Deleted

## 2022-04-23 ENCOUNTER — Other Ambulatory Visit: Payer: Self-pay

## 2022-04-23 ENCOUNTER — Ambulatory Visit (HOSPITAL_COMMUNITY)
Admission: RE | Admit: 2022-04-23 | Discharge: 2022-04-23 | Disposition: A | Discharge status: Home / Self Care | Payer: Medicare HMO | Attending: Internal Medicine | Admitting: Internal Medicine

## 2022-04-23 ENCOUNTER — Encounter (HOSPITAL_COMMUNITY)
Admission: RE | Disposition: A | Discharge status: Home / Self Care | Payer: Medicare HMO | Source: Home / Self Care | Attending: Internal Medicine

## 2022-04-23 ENCOUNTER — Ambulatory Visit (HOSPITAL_COMMUNITY): Payer: Medicare HMO

## 2022-04-23 DIAGNOSIS — I252 Old myocardial infarction: Secondary | ICD-10-CM | POA: Diagnosis not present

## 2022-04-23 DIAGNOSIS — I2510 Atherosclerotic heart disease of native coronary artery without angina pectoris: Secondary | ICD-10-CM | POA: Diagnosis not present

## 2022-04-23 DIAGNOSIS — I495 Sick sinus syndrome: Secondary | ICD-10-CM | POA: Insufficient documentation

## 2022-04-23 DIAGNOSIS — I129 Hypertensive chronic kidney disease with stage 1 through stage 4 chronic kidney disease, or unspecified chronic kidney disease: Secondary | ICD-10-CM | POA: Insufficient documentation

## 2022-04-23 DIAGNOSIS — Z9861 Coronary angioplasty status: Secondary | ICD-10-CM

## 2022-04-23 DIAGNOSIS — Z45010 Encounter for checking and testing of cardiac pacemaker pulse generator [battery]: Secondary | ICD-10-CM | POA: Diagnosis not present

## 2022-04-23 DIAGNOSIS — I442 Atrioventricular block, complete: Secondary | ICD-10-CM | POA: Insufficient documentation

## 2022-04-23 DIAGNOSIS — N184 Chronic kidney disease, stage 4 (severe): Secondary | ICD-10-CM | POA: Diagnosis not present

## 2022-04-23 DIAGNOSIS — Z79899 Other long term (current) drug therapy: Secondary | ICD-10-CM | POA: Diagnosis not present

## 2022-04-23 DIAGNOSIS — I472 Ventricular tachycardia, unspecified: Secondary | ICD-10-CM | POA: Diagnosis not present

## 2022-04-23 DIAGNOSIS — Z951 Presence of aortocoronary bypass graft: Secondary | ICD-10-CM | POA: Insufficient documentation

## 2022-04-23 DIAGNOSIS — J9811 Atelectasis: Secondary | ICD-10-CM | POA: Diagnosis not present

## 2022-04-23 DIAGNOSIS — Z955 Presence of coronary angioplasty implant and graft: Secondary | ICD-10-CM | POA: Insufficient documentation

## 2022-04-23 DIAGNOSIS — I4819 Other persistent atrial fibrillation: Secondary | ICD-10-CM | POA: Insufficient documentation

## 2022-04-23 DIAGNOSIS — I255 Ischemic cardiomyopathy: Secondary | ICD-10-CM | POA: Diagnosis not present

## 2022-04-23 HISTORY — PX: BIV UPGRADE: EP1202

## 2022-04-23 SURGERY — BIV UPGRADE

## 2022-04-23 MED ORDER — ONDANSETRON HCL 4 MG/2ML IJ SOLN: 4.0000 mg | Freq: Four times a day (QID) | INTRAMUSCULAR | Status: DC | PRN

## 2022-04-23 MED ORDER — LIDOCAINE HCL (PF) 1 % IJ SOLN
INTRAMUSCULAR | Status: DC | PRN
  Administered 2022-04-23: 60 mL

## 2022-04-23 MED ORDER — POVIDONE-IODINE 10 % EX SWAB
2.0000 | Freq: Once | CUTANEOUS | Status: AC
  Administered 2022-04-23: 2 via TOPICAL

## 2022-04-23 MED ORDER — FENTANYL CITRATE (PF) 100 MCG/2ML IJ SOLN
INTRAMUSCULAR | Status: DC | PRN
  Administered 2022-04-23 (×4): 25 ug via INTRAVENOUS

## 2022-04-23 MED ORDER — ACETAMINOPHEN 325 MG PO TABS: 325.0000 mg | ORAL_TABLET | ORAL | Status: DC | PRN

## 2022-04-23 MED ORDER — CEFAZOLIN SODIUM-DEXTROSE 2-4 GM/100ML-% IV SOLN
INTRAVENOUS | Status: AC
  Filled 2022-04-23: qty 100

## 2022-04-23 MED ORDER — MIDAZOLAM HCL 5 MG/5ML IJ SOLN
INTRAMUSCULAR | Status: DC | PRN
  Administered 2022-04-23 (×4): 1 mg via INTRAVENOUS

## 2022-04-23 MED ORDER — SODIUM CHLORIDE 0.9 % IV SOLN
80.0000 mg | INTRAVENOUS | Status: AC
  Administered 2022-04-23: 80 mg

## 2022-04-23 MED ORDER — HEPARIN (PORCINE) IN NACL 1000-0.9 UT/500ML-% IV SOLN
INTRAVENOUS | Status: AC
  Filled 2022-04-23: qty 500

## 2022-04-23 MED ORDER — HEPARIN (PORCINE) IN NACL 1000-0.9 UT/500ML-% IV SOLN
INTRAVENOUS | Status: DC | PRN
  Administered 2022-04-23: 500 mL

## 2022-04-23 MED ORDER — SODIUM CHLORIDE 0.9 % IV SOLN: INTRAVENOUS | Status: DC

## 2022-04-23 MED ORDER — FENTANYL CITRATE (PF) 100 MCG/2ML IJ SOLN
INTRAMUSCULAR | Status: AC
  Filled 2022-04-23: qty 2

## 2022-04-23 MED ORDER — SODIUM CHLORIDE 0.9 % IV SOLN
INTRAVENOUS | Status: AC
  Filled 2022-04-23: qty 2

## 2022-04-23 MED ORDER — CHLORHEXIDINE GLUCONATE 4 % EX LIQD
4.0000 | Freq: Once | CUTANEOUS | Status: DC
  Filled 2022-04-23: qty 60

## 2022-04-23 MED ORDER — CEFAZOLIN SODIUM-DEXTROSE 2-4 GM/100ML-% IV SOLN
2.0000 g | INTRAVENOUS | Status: AC
  Administered 2022-04-23: 2 g via INTRAVENOUS

## 2022-04-23 MED ORDER — MIDAZOLAM HCL 5 MG/5ML IJ SOLN
INTRAMUSCULAR | Status: AC
  Filled 2022-04-23: qty 5

## 2022-04-23 MED ORDER — LIDOCAINE HCL (PF) 1 % IJ SOLN
INTRAMUSCULAR | Status: AC
  Filled 2022-04-23: qty 60

## 2022-04-23 SURGICAL SUPPLY — 16 items
CABLE SURGICAL S-101-97-12 (CABLE) ×1 IMPLANT
CATH RIGHTSITE C315HIS02 (CATHETERS) IMPLANT
HEMOSTAT SURGICEL 2X4 FIBR (HEMOSTASIS) IMPLANT
KIT MICROPUNCTURE NIT STIFF (SHEATH) IMPLANT
LEAD SELECT SECURE 3830 383069 (Lead) IMPLANT
MAT PREVALON FULL STRYKER (MISCELLANEOUS) IMPLANT
PACEMAKER PRCT MRI CRTP W1TR01 (Pacemaker) IMPLANT
PAD DEFIB RADIO PHYSIO CONN (PAD) ×1 IMPLANT
PPM PRECEPTA MRI CRT-P W1TR01 (Pacemaker) ×1 IMPLANT
SELECT SECURE 3830 383069 (Lead) ×1 IMPLANT
SHEATH 7FR PRELUDE SNAP 13 (SHEATH) IMPLANT
SHEATH 9FR PRELUDE SNAP 13 (SHEATH) IMPLANT
SLITTER 6232ADJ (MISCELLANEOUS) IMPLANT
TRAY PACEMAKER INSERTION (PACKS) ×1 IMPLANT
WIRE HI TORQ VERSACORE-J 145CM (WIRE) IMPLANT
WIRE MICRO SET SILHO 5FR 7 (SHEATH) IMPLANT

## 2022-04-23 NOTE — Discharge Instructions (Signed)
After Your Pacemaker   You have a [employer] Pacemaker  ACTIVITY Do not lift your arm above shoulder height for 1 week after your procedure. After 7 days, you may progress as below.  You should remove your sling 24 hours after your procedure, unless otherwise instructed by your provider.     Wednesday April 30, 2022  Thursday May 01, 2022 Friday May 02, 2022 Saturday May 03, 2022   Do not lift, push, pull, or carry anything over 10 pounds with the affected arm until 6 weeks (Wednesday June 04, 2022 ) after your procedure.   You may drive AFTER your wound check, unless you have been told otherwise by your provider.   Ask your healthcare provider when you can go back to work   INCISION/Dressing If you are on a blood thinner such as Coumadin, Xarelto, Eliquis, Plavix, or Pradaxa please confirm with your provider when this should be resumed. Sunday Evening 04/27/22  If large square, outer bandage is left in place, this can be removed after 24 hours from your procedure. Do not remove steri-strips or glue as below.   Monitor your Pacemaker site for redness, swelling, and drainage. Call the device clinic at [phone] if you experience these symptoms or fever/chills.  If your incision is sealed with Steri-strips or staples, you may shower 7 days after your procedure or when told by your provider. Do not remove the steri-strips or let the shower hit directly on your site. You may wash around your site with soap and water.    If you were discharged in a sling, please do not wear this during the day more than 48 hours after your surgery unless otherwise instructed. This may increase the risk of stiffness and soreness in your shoulder.   Avoid lotions, ointments, or perfumes over your incision until it is well-healed.  You may use a hot tub or a pool AFTER your wound check appointment if the incision is completely closed.  Pacemaker Alerts:  Some alerts are vibratory and others  beep. These are NOT emergencies. Please call our office to let us know. If this occurs at night or on weekends, it can wait until the next business day. Send a remote transmission.  If your device is capable of reading fluid status (for heart failure), you will be offered monthly monitoring to review this with you.   DEVICE MANAGEMENT Remote monitoring is used to monitor your pacemaker from home. This monitoring is scheduled every 91 days by our office. It allows [place] to keep an eye on the functioning of your device to ensure it is working properly. You will routinely see your Electrophysiologist annually (more often if necessary).   You should receive your ID card for your new device in 4-8 weeks. Keep this card with you at all times once received. Consider wearing a medical alert bracelet or necklace.  Your Pacemaker may be MRI compatible. This will be discussed at your next office visit/wound check.  You should avoid contact with strong electric or magnetic fields.   Do not use amateur (ham) radio equipment or electric (arc) welding torches. MP3 player headphones with magnets should not be used. Some devices are safe to use if held at least 12 inches (30 cm) from your Pacemaker. These include power tools, lawn mowers, and speakers. If you are unsure if something is safe to use, ask your health care provider.  When using your cell phone, hold it to the ear that is on the opposite side  from the Pacemaker. Do not leave your cell phone in a pocket over the Pacemaker.  You may safely use electric blankets, heating pads, computers, and microwave ovens.  Call the office right away if: You have chest pain. You feel more short of breath than you have felt before. You feel more light-headed than you have felt before. Your incision starts to open up.  This information is not intended to replace advice given to you by your health care provider. Make sure you discuss any questions you have with your  health care provider.

## 2022-04-23 NOTE — Progress Notes (Signed)
Cardiac Individual Treatment Plan  Patient Details  Name: Kyle Avila MRN: [id number] Date of Birth: Oct 11, 1945 Referring Provider:   Flowsheet Row Cardiac Rehab from 02/05/2022 in [facility] Cardiac and Pulmonary Rehab  Referring Provider [name] MD       Initial Encounter Date:  Flowsheet Row Cardiac Rehab from 02/05/2022 in [facility] Cardiac and Pulmonary Rehab  Date 02/05/22       Visit Diagnosis: S/P PTCA (percutaneous transluminal coronary angioplasty)  Patient's Home Medications on Admission: No current outpatient medications on file.  Past Medical History: Past Medical History:  Diagnosis Date   Arthritis    "some in my back" (04/14/2017)   CAD (coronary artery disease)    a.  s/p CABG 1988 with redo in 1996 with multiple PCIs since then   Carotid artery occlusion    s/p RCEA   CKD (chronic kidney disease), stage IV ([place])    Complete heart block ([place])    s/p PTVDP   Gout    HTN (hypertension)    Hyperlipidemia    Lupus ([place])    "that's why my kidneys are gone; lupus attacked them" (04/14/2017)   Mitral insufficiency    Moderate mitral regurgitation 10/02/2021   Echocardiogram 6/23: EF 87, global HK, mild ASH, mildly reduced RVSF, normal PASP, RVSP 31, mod LAD, mild RAE, mod MR, trivial AI, AV sclerosis w/o stenosis   Myocardial infarction ([place])    "I've had some mild one" (04/14/2017)   Presence of permanent cardiac pacemaker    Skin cancer    "nose"   VT (ventricular tachycardia)-nonsustained 05/20/2011    Tobacco Use: Social History   Tobacco Use  Smoking Status Never   Passive exposure: Yes  Smokeless Tobacco Never  Tobacco Comments   Never smoke 10/21/21    Labs: Review Flowsheet  More data exists      Latest Ref Rng & Units 05/04/2015 02/12/2016 10/19/2019 10/28/2019 04/07/2022  Labs for ITP Cardiac and Pulmonary Rehab  Cholestrol 100 - 199 mg/dL 159  159  90  133  96   LDL (calc) 0 - 99 mg/dL 84  87  NEG 2  52  15   Direct LDL 0 - 99 mg/dL  - - - 47  -  HDL-C >39 mg/dL 51  46  37  41  57   Trlycerides 0 - 149 mg/dL 118  130  275  254  145      Exercise Target Goals: Exercise Program Goal: Individual exercise prescription set using results from initial 6 min walk test and THRR while considering  patient's activity barriers and safety.   Exercise Prescription Goal: Initial exercise prescription builds to 30-45 minutes a day of aerobic activity, 2-3 days per week.  Home exercise guidelines will be given to patient during program as part of exercise prescription that the participant will acknowledge.   Education: Aerobic Exercise: - Group verbal and visual presentation on the components of exercise prescription. Introduces F.I.T.T principle from ACSM for exercise prescriptions.  Reviews F.I.T.T. principles of aerobic exercise including progression. Written material given at graduation.   Education: Resistance Exercise: - Group verbal and visual presentation on the components of exercise prescription. Introduces F.I.T.T principle from ACSM for exercise prescriptions  Reviews F.I.T.T. principles of resistance exercise including progression. Written material given at graduation.    Education: Exercise & Equipment Safety: - Individual verbal instruction and demonstration of equipment use and safety with use of the equipment. Flowsheet Row Cardiac Rehab from 04/02/2022 in [facility] Cardiac  and Pulmonary Rehab  Date 01/29/22  Educator [facility]  Instruction Review Code 1- Verbalizes Understanding       Education: Exercise Physiology & General Exercise Guidelines: - Group verbal and written instruction with models to review the exercise physiology of the cardiovascular system and associated critical values. Provides general exercise guidelines with specific guidelines to those with heart or lung disease.    Education: Flexibility, Balance, Mind/Body Relaxation: - Group verbal and visual presentation with interactive activity on the  components of exercise prescription. Introduces F.I.T.T principle from ACSM for exercise prescriptions. Reviews F.I.T.T. principles of flexibility and balance exercise training including progression. Also discusses the mind body connection.  Reviews various relaxation techniques to help reduce and manage stress (i.e. Deep breathing, progressive muscle relaxation, and visualization). Balance handout provided to take home. Written material given at graduation.   Activity Barriers & Risk Stratification:  Activity Barriers & Cardiac Risk Stratification - 02/05/22 1217       Activity Barriers & Cardiac Risk Stratification   Activity Barriers Other (comment);Deconditioning;Muscular Weakness;Shortness of Breath;Decreased Ventricular Function;Balance Concerns;Chest Pain/Angina    Comments quick onset of fatigue (rushing especially)    Cardiac Risk Stratification High             6 Minute Walk:  6 Minute Walk     Row Name 02/05/22 1216         6 Minute Walk   Phase Initial     Distance 1110 feet     Walk Time 6 minutes     # of Rest Breaks 0     MPH 2.1     METS 2.02     RPE 13     Perceived Dyspnea  2     VO2 Peak 7.06     Symptoms Yes (comment)     Comments SOB, chest pain with arm pain 4/10     Resting HR 58 bpm     Resting BP 128/64     Resting Oxygen Saturation  98 %     Exercise Oxygen Saturation  during 6 min walk 98 %     Max Ex. HR 75 bpm     Max Ex. BP 128/60     2 Minute Post BP 124/60              Oxygen Initial Assessment:   Oxygen Re-Evaluation:   Oxygen Discharge (Final Oxygen Re-Evaluation):   Initial Exercise Prescription:  Initial Exercise Prescription - 02/05/22 1200       Date of Initial Exercise RX and Referring Provider   Date 02/05/22    Referring Provider [name] MD      Oxygen   Maintain Oxygen Saturation 88% or higher      Treadmill   MPH 1.8    Grade 0.5    Minutes 15    METs 2.5      Recumbant Bike   Level 1     RPM 50    Watts 10    Minutes 15    METs 2      REL-XR   Level 2    Speed 50    Minutes 15    METs 2      Track   Laps 27    Minutes 15    METs 2.47      Prescription Details   Frequency (times per week) 2    Duration Progress to 30 minutes of continuous aerobic without signs/symptoms of physical distress  Intensity   THRR 40-80% of Max Heartrate 92-126    Ratings of Perceived Exertion 11-13    Perceived Dyspnea 0-4      Progression   Progression Continue to progress workloads to maintain intensity without signs/symptoms of physical distress.      Resistance Training   Training Prescription Yes    Weight 4 lb    Reps 10-15             Perform Capillary Blood Glucose checks as needed.  Exercise Prescription Changes:   Exercise Prescription Changes     Row Name 02/05/22 1200 02/11/22 1500 02/25/22 1300 04/08/22 1500       Response to Exercise   Blood Pressure (Admit) 128/64 102/58 110/58 126/64    Blood Pressure (Exercise) 128/60 132/70 142/62 144/70    Blood Pressure (Exit) 124/60 118/64 102/58 116/58    Heart Rate (Admit) 58 bpm 52 bpm 67 bpm 64 bpm    Heart Rate (Exercise) 75 bpm 76 bpm 96 bpm 74 bpm    Heart Rate (Exit) 66 bpm 68 bpm 66 bpm 56 bpm    Oxygen Saturation (Admit) 98 % -- -- --    Oxygen Saturation (Exercise) 98 % -- -- --    Rating of Perceived Exertion (Exercise) _0 Perceived Dyspnea (Exercise) 2 -- -- --    Symptoms SOB, chest/arm pain 4/10 none none none    Comments walk test results 1st full day of exercise -- --    Duration -- Progress to 30 minutes of  aerobic without signs/symptoms of physical distress Continue with 30 min of aerobic exercise without signs/symptoms of physical distress. Continue with 30 min of aerobic exercise without signs/symptoms of physical distress.    Intensity -- THRR unchanged THRR unchanged THRR unchanged      Progression   Progression -- Continue to progress workloads to maintain  intensity without signs/symptoms of physical distress. Continue to progress workloads to maintain intensity without signs/symptoms of physical distress. Continue to progress workloads to maintain intensity without signs/symptoms of physical distress.    Average METs -- 2.9 3.1 2.83      Resistance Training   Training Prescription -- Yes Yes Yes    Weight -- 4 lb 4 lb 4 lb    Reps -- 10-15 10-15 10-15      Interval Training   Interval Training -- No No No      Treadmill   MPH -- 2 2.3 2.1    Grade -- 0.5 0.5 2    Minutes -- _1 METs -- 2.64 2.92 3.19      Recumbant Bike   Level -- -- 3 4    Watts -- -- 23 23    Minutes -- -- 15 15    METs -- -- 2.91 2.9      REL-XR   Level -- _2 Minutes -- _3 METs -- -- 4.5 4.2      Track   Laps -- -- -- 27    Minutes -- -- -- 15    METs -- -- -- 2.47      Oxygen   Maintain Oxygen Saturation -- -- 88% or higher 88% or higher             Exercise Comments:   Exercise Comments     Row Name 02/06/22 [id number] 02/18/22 [phone]  Exercise Comments First full day of exercise!  Patient was oriented to gym and equipment including functions, settings, policies, and procedures.  Patient's individual exercise prescription and treatment plan were reviewed.  All starting workloads were established based on the results of the 6 minute walk test done at initial orientation visit.  The plan for exercise progression was also introduced and progression will be customized based on patient's performance and goals. Fell in yard while doing yardwork, few scratches,no other injury               Exercise Goals and Review:   Exercise Goals     Row Name 02/05/22 1220             Exercise Goals   Increase Physical Activity Yes       Intervention Provide advice, education, support and counseling about physical activity/exercise needs.;Develop an individualized exercise prescription for aerobic and resistive training based  on initial evaluation findings, risk stratification, comorbidities and participant's personal goals.       Expected Outcomes Short Term: Attend rehab on a regular basis to increase amount of physical activity.;Long Term: Exercising regularly at least 3-5 days a week.;Long Term: Add in home exercise to make exercise part of routine and to increase amount of physical activity.       Increase Strength and Stamina Yes       Intervention Provide advice, education, support and counseling about physical activity/exercise needs.;Develop an individualized exercise prescription for aerobic and resistive training based on initial evaluation findings, risk stratification, comorbidities and participant's personal goals.       Expected Outcomes Short Term: Increase workloads from initial exercise prescription for resistance, speed, and METs.;Short Term: Perform resistance training exercises routinely during rehab and add in resistance training at home;Long Term: Improve cardiorespiratory fitness, muscular endurance and strength as measured by increased METs and functional capacity (6MWT)       Able to understand and use rate of perceived exertion (RPE) scale Yes       Intervention Provide education and explanation on how to use RPE scale       Expected Outcomes Short Term: Able to use RPE daily in rehab to express subjective intensity level;Long Term:  Able to use RPE to guide intensity level when exercising independently       Able to understand and use Dyspnea scale Yes       Intervention Provide education and explanation on how to use Dyspnea scale       Expected Outcomes Long Term: Able to use Dyspnea scale to guide intensity level when exercising independently;Short Term: Able to use Dyspnea scale daily in rehab to express subjective sense of shortness of breath during exertion       Knowledge and understanding of Target Heart Rate Range (THRR) Yes       Intervention Provide education and explanation of THRR  including how the numbers were predicted and where they are located for reference       Expected Outcomes Long Term: Able to use THRR to govern intensity when exercising independently;Short Term: Able to state/look up THRR;Short Term: Able to use daily as guideline for intensity in rehab       Able to check pulse independently Yes       Intervention Provide education and demonstration on how to check pulse in carotid and radial arteries.;Review the importance of being able to check your own pulse for safety during independent exercise       Expected Outcomes  Short Term: Able to explain why pulse checking is important during independent exercise;Long Term: Able to check pulse independently and accurately       Understanding of Exercise Prescription Yes       Intervention Provide education, explanation, and written materials on patient's individual exercise prescription       Expected Outcomes Short Term: Able to explain program exercise prescription;Long Term: Able to explain home exercise prescription to exercise independently                Exercise Goals Re-Evaluation :  Exercise Goals Re-Evaluation     Row Name 02/06/22 1937 02/11/22 1544 02/25/22 1340 03/10/22 1603 03/26/22 1313     Exercise Goal Re-Evaluation   Exercise Goals Review Able to understand and use rate of perceived exertion (RPE) scale;Able to understand and use Dyspnea scale;Knowledge and understanding of Target Heart Rate Range (THRR);Understanding of Exercise Prescription Increase Physical Activity;Increase Strength and Stamina;Understanding of Exercise Prescription Increase Physical Activity;Increase Strength and Stamina;Understanding of Exercise Prescription Increase Physical Activity;Increase Strength and Stamina;Understanding of Exercise Prescription Increase Physical Activity;Increase Strength and Stamina;Understanding of Exercise Prescription   Comments Reviewed RPE scale, THR and program prescription with pt today.   Pt voiced understanding and was given a copy of goals to take home. Kaynen is off to a good start for the first session he has been here. He was able to do his initial exercise prescription plus more. He increased his level on the XR to 4 and also increased his speed to 2.0 mph. We will continue to monitor as he progresses in the program. Rodrick is doing well in rehab. He recently increased his overall average MET level to 3.1 METs. He also has been walking on the treadmill at a speed of 2.3 mph and an incline of 0.5%. He has tolerated 4 lb hand weights for resistance training as well. We will continue to monitor his progress in the program. Miliano has not been to rehab due to having skin issues that were not dry and contained. He was cleared to come back as long as his lessions are dry and crusted, patient aware. We will continue to monitor patient when he resumes back in rehab. Jazmine has not been to rehab due to having skin issues that were not dry and contained. He was cleared to come back as long as his lessions are dry and crusted, patient aware. We will continue to monitor patient when he resumes back in rehab.   Expected Outcomes Short: Use RPE daily to regulate intensity.  Long: Follow program prescription in THR. Short: Continue to attend rehab and working with machines Long: Continue to build up overall strength and stamina Short: Continue to increase workload on the treadmill. Long: Continue to increase strength and stamina. Short: Return back to rehab with good attendance Long: Continue to increase overall MET level Short: Return back to rehab with good attendance Long: Continue to increase overall MET level    Row Name 04/08/22 0756 04/08/22 1510           Exercise Goal Re-Evaluation   Exercise Goals Review Increase Physical Activity;Increase Strength and Stamina;Understanding of Exercise Prescription Increase Physical Activity;Increase Strength and Stamina;Understanding of Exercise  Prescription      Comments Levorn is doing well in rehab.  He is going to [facility] on his off days and uses the stepper.  He is also using the machines for upper body weights.  We talked about using lower body as well. He is also walking  some.  He is still not where he was prior to surgery but it is starting to get better.  He is also starting to do more weights/yoga at home.  He is able to do more around the house as well. Benjimin has gotten back into a routine with exercise since he was out for several weeks. He was able to complete a full 27 laps on the track and also increased on both the recumbent bike to level 4 and the XR to level 5!  He is not quite hitting his THR but RPEs have been appropriate. Will continue to monitor.      Expected Outcomes Short: Continue to go to [facility] on off days Long: Continue to improve stamina Short: Continue to increase laps on track Long: Continue to increase overall MET level               Discharge Exercise Prescription (Final Exercise Prescription Changes):  Exercise Prescription Changes - 04/08/22 1500       Response to Exercise   Blood Pressure (Admit) 126/64    Blood Pressure (Exercise) 144/70    Blood Pressure (Exit) 116/58    Heart Rate (Admit) 64 bpm    Heart Rate (Exercise) 74 bpm    Heart Rate (Exit) 56 bpm    Rating of Perceived Exertion (Exercise) 13    Symptoms none    Duration Continue with 30 min of aerobic exercise without signs/symptoms of physical distress.    Intensity THRR unchanged      Progression   Progression Continue to progress workloads to maintain intensity without signs/symptoms of physical distress.    Average METs 2.83      Resistance Training   Training Prescription Yes    Weight 4 lb    Reps 10-15      Interval Training   Interval Training No      Treadmill   MPH 2.1    Grade 2    Minutes 15    METs 3.19      Recumbant Bike   Level 4    Watts 23    Minutes 15    METs 2.9      REL-XR   Level 5     Minutes 15    METs 4.2      Track   Laps 27    Minutes 15    METs 2.47      Oxygen   Maintain Oxygen Saturation 88% or higher             Nutrition:  Target Goals: Understanding of nutrition guidelines, daily intake of sodium <1538m, cholesterol <2058m calories 30% from fat and 7% or less from saturated fats, daily to have 5 or more servings of fruits and vegetables.  Education: All About Nutrition: -Group instruction provided by verbal, written material, interactive activities, discussions, models, and posters to present general guidelines for heart healthy nutrition including fat, fiber, MyPlate, the role of sodium in heart healthy nutrition, utilization of the nutrition label, and utilization of this knowledge for meal planning. Follow up email sent as well. Written material given at graduation. [facility]  Date 03/27/22  [facility] Institute[facility]ruction Review Code 1- Verbalizes Understanding  [part II]       Biometrics:  Pre Biometrics - 02/05/22 1221       Pre Biometrics   Height _0  (1.727 m)    Weight 172 lb 11.2 oz (78.3  kg)    Waist Circumference 38 inches    Hip Circumference 37.25 inches    Waist to Hip Ratio 1.02 %    BMI (Calculated) 26.26    Single Leg Stand 2.9 seconds              Nutrition Therapy Plan and Nutrition Goals:  Nutrition Therapy & Goals - 02/05/22 0859       Nutrition Therapy   Diet Heart healthy, low Na, CKD stg 4 MNT    Drug/Food Interactions Purine/Gout    Protein (specify units) 70-75g    Fiber 28 grams    Whole Grain Foods 3 servings   or less depending on his potassium   Saturated Fats 15 max. grams    Fruits and Vegetables 8 servings/day    Sodium 1.5 grams      Personal Nutrition Goals   Nutrition Goal ST: inclde, but limit high potassium/phosphorus containing foods. Review paperwork. identify high sources of sodium in the foods he eats regularly.   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    Comments 76 y.o. M admitted to cardiac rehab s/p PTCA. PMHx includes CAD, chronic CHF, CKD stage 4, HTN, HLD, AFib, PVD, CHB with pacemaker, arthritis, gout, lupus, MI (2018), skin cancer. PSHx includes CABG (1988, 1996), stent (2021). Relevant medications includes furosemide, prednisone, bactrim. Recent labs: Basic metabolic panel: BUN 76, creatinine 2.73, GFR 23, BUN/creatinine ratio ratio 28. Adib reports no gout flare-ups in decades, provided handout for his reference if he were to have a flare up again. He has been working with a neprhologist to manage his CKD stg 4 which he reports having since 1992. Discussed considerations for CKD including sodium, potassium, and phosphorus; his last blood result showed his potassium was on the high end of the normal range - recommended limiting high sources of potassium for the day, provided list. Rasmus is nervous today as he has been having issues with edema and feels it began when he started the prednisone which his MD is tapering him off of - encouraged him to connect with his MD regarding this as the increased lasix has not been taking off his fluid. We discussed sodium recommendations, food label reading, and sources that are higher in sodium. He reports being hungrier than usual due to the prednisone so he has been having  B: bacon 3x/week, fruits (blueberries, bananas, and dates) with a fiber rich cereal like grape nuts and  2% milk . He will usually have some OJ as well as coffee. S: english muffin with PB and jelly L: home made tuna or chicken salad sandwich with lettuce D: Chicken, fish. Steamed vegetables ( he buys vegetables fresh). He does not usually have them with any carbohydrates aside from some rice. Drinks: sipping water during the day to help reduce swelling. He reports using greek seasoning as well as another seasoning  blend. He uses olive oil and some salt from a grinder. He reports using whole grain bread most of the time. 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 He feels that his sodium may be coming from the seasoning blends so he will check those when he gets home; suggested that if he uses it often, he could make his own seasoning blend without sodium - Trung reported that he would rather look for one lower in sodium.      Intervention Plan   Intervention Prescribe, educate and  counsel regarding individualized specific dietary modifications aiming towards targeted core components such as weight, hypertension, lipid management, diabetes, heart failure and other comorbidities.    Expected Outcomes Short Term Goal: Understand basic principles of dietary content, such as calories, fat, sodium, cholesterol and nutrients.;Long Term Goal: Adherence to prescribed nutrition plan.;Short Term Goal: A plan has been developed with personal nutrition goals set during dietitian appointment.             Nutrition Assessments:  MEDIFICTS Score Key: ?70 Need to make dietary changes  40-70 Heart Healthy Diet ? 40 Therapeutic Level Cholesterol Diet  Flowsheet Row Cardiac Rehab from 02/05/2022 in [facility] Cardiac and Pulmonary Rehab  Picture Your Plate Total Score on Admission 72      Picture Your Plate Scores: <84 Unhealthy dietary pattern with much room for improvement. 41-50 Dietary pattern unlikely to meet recommendations for good health and room for improvement. 51-60 More healthful dietary pattern, with some room for improvement.  >60 Healthy dietary pattern, although there may be some specific behaviors that could be improved.    Nutrition Goals Re-Evaluation:  Nutrition Goals Re-Evaluation     [place] Name 04/08/22 0802             Goals   Nutrition Goal ST: inclde, but limit high potassium/phosphorus containing foods. Review paperwork. identify high sources of sodium in the foods he  eats regularly.  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       Comment Marciano is doing well in rehab.  He has had some family move in with him that are helping him cook.  They are all trying to aim for healthy cooking and helping him get more variety.  They are using broiler and Wlliam is able to try vegetables in new ways.  He is still trying to cut back on sodium and sugar.       Expected Outcome Short: Continue with new variety Long: Continue to focus on heart healthy eating                Nutrition Goals Discharge (Final Nutrition Goals Re-Evaluation):  Nutrition Goals Re-Evaluation - 04/08/22 0802       Goals   Nutrition Goal ST: inclde, but limit high potassium/phosphorus containing foods. Review paperwork. identify high sources of sodium in the foods he eats regularly.  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    Comment Amram is doing well in rehab.  He has had some family move in with him that are helping him cook.  They are all trying to aim for healthy cooking and helping him get more variety.  They are using broiler and Jovani is able to try vegetables in new ways.  He is still trying to cut back on sodium and sugar.    Expected Outcome Short: Continue with new variety Long: Continue to focus on heart healthy eating             Psychosocial: Target Goals: Acknowledge presence or absence of significant depression and/or stress, maximize coping skills, provide positive support system. Participant is able to verbalize types and ability to use techniques and skills needed for reducing stress and depression.   Education: Stress, Anxiety, and Depression - Group verbal and visual presentation to define topics covered.  Reviews how body is impacted by stress, anxiety, and depression.  Also discusses healthy  ways to reduce stress and to treat/manage anxiety and depression.  Written material given at graduation. Flowsheet Row Cardiac Rehab from 04/02/2022 in [facility] Cardiac and Pulmonary Rehab  Date 02/20/22  Educator [facility]  Instruction Review Code 1- [employer] Understanding       Education: Sleep Hygiene -Provides group verbal and written instruction about how sleep can affect your health.  Define sleep hygiene, discuss sleep cycles and impact of sleep habits. Review good sleep hygiene tips.    Initial Review & Psychosocial Screening:  Initial Psych Review & Screening - 01/29/22 1426       Initial Review   Current issues with None Identified      Family Dynamics   Good Support System? Yes    Comments He states that he does not have many people for support. He has a few kids and one that is close by.      Barriers   Psychosocial barriers to participate in program There are no identifiable barriers or psychosocial needs.;The patient should benefit from training in stress management and relaxation.      Screening Interventions   Interventions To provide support and resources with identified psychosocial needs;Encouraged to exercise;Provide feedback about the scores to participant    Expected Outcomes Short Term goal: Utilizing psychosocial counselor, staff and physician to assist with identification of specific Stressors or current issues interfering with healing process. Setting desired goal for each stressor or current issue identified.;Long Term Goal: Stressors or current issues are controlled or eliminated.;Short Term goal: Identification and review with participant of any Quality of Life or Depression concerns found by scoring the questionnaire.;Long Term goal: The participant improves quality of Life and PHQ9 Scores as seen by post scores and/or verbalization of changes             Quality of Life Scores:   Quality of Life - 02/05/22 1221       Quality of Life   Select Quality of  Life      Quality of Life Scores   Health/Function Pre 14.37 %    Socioeconomic Pre 21.75 %    Psych/Spiritual Pre 21.21 %    Family Pre 24.3 %    GLOBAL Pre 18.14 %            Scores of 19 and below usually indicate a poorer quality of life in these areas.  A difference of  2-3 points is a clinically meaningful difference.  A difference of 2-3 points in the total score of the Quality of Life Index has been associated with significant improvement in overall quality of life, self-image, physical symptoms, and general health in studies assessing change in quality of life.  PHQ-9: Review Flowsheet       03/27/2022 02/05/2022 08/03/2017 07/01/2017  Depression screen PHQ 2/9  Decreased Interest 2 0 0 0  Down, Depressed, Hopeless 1 0 0 0  PHQ - 2 Score 3 0 0 0  Altered sleeping 3 3 - -  Tired, decreased energy 3 3 - -  Change in appetite 0 0 - -  Feeling bad or failure about yourself  1 0 - -  Trouble concentrating 0 0 - -  Moving slowly or fidgety/restless 0 0 - -  Suicidal thoughts 0 0 - -  PHQ-9 Score 10 6 - -  Difficult doing work/chores Somewhat difficult Somewhat difficult - -   Interpretation of Total Score  Total Score Depression Severity:  1-4 = Minimal depression, 5-9 = Mild depression, 10-14 =  Moderate depression, 15-19 = Moderately severe depression, 20-27 = Severe depression   Psychosocial Evaluation and Intervention:  Psychosocial Evaluation - 01/29/22 1427       Psychosocial Evaluation & Interventions   Interventions Encouraged to exercise with the program and follow exercise prescription;Relaxation education;Stress management education    Comments He states that he does not have many people for support. He has a few kids and one that is close by.    Expected Outcomes Short: Start Heart[facility] to help with mood. Long: Maintain a healthy mental state    Continue Psychosocial Services  Follow up required by staff             Psychosocial Re-Evaluation:   Psychosocial Re-Evaluation     Row Name 03/27/22 0806 04/08/22 0759           Psychosocial Re-Evaluation   Current issues with Current Stress Concerns;Current Depression Current Stress Concerns;Current Depression      Comments Reviewed patient health questionnaire (PHQ-9) with patient for follow up. Previously, patients score indicated signs/symptoms of depression.  Reviewed to see if patient is improving symptom wise while in program.  Score declined and patient states that it is because they have been dealing with multiple ongoing health issues. He had missed the last month due to Shingles. Khaleb is doing well in rehab.  He tries not to get too stressed out.  His PHQ did decline, but he is trying to have a better attitude and to look for the good in things.  He is planning to go to [place] for the holidays.  He is starting to come off his prednisone and is starting to sleep better.  Prior to that, he was only getting about 4 hours a night.  He also has frequently trips to bathroom. He is looking forward to continue to decrease his prednisone dose to help with sleep.      Expected Outcomes Short; Get back to regular exercise  routine  Long; Continue to exericse for mental boost Short: Continue to work on sleep Long: Enjoy trip to [place]      Interventions Encouraged to attend Cardiac Rehabilitation for the exercise;Stress management education Encouraged to attend Cardiac Rehabilitation for the exercise;Stress management education      Continue Psychosocial Services  No Follow up required Follow up required by staff               Psychosocial Discharge (Final Psychosocial Re-Evaluation):  Psychosocial Re-Evaluation - 04/08/22 0759       Psychosocial Re-Evaluation   Current issues with Current Stress Concerns;Current Depression    Comments Hampton is doing well in rehab.  He tries not to get too stressed out.  His PHQ did decline, but he is trying to have a better attitude and to look for  the good in things.  He is planning to go to [place] for the holidays.  He is starting to come off his prednisone and is starting to sleep better.  Prior to that, he was only getting about 4 hours a night.  He also has frequently trips to bathroom. He is looking forward to continue to decrease his prednisone dose to help with sleep.    Expected Outcomes Short: Continue to work on sleep Long: Enjoy trip to [place]    Interventions Encouraged to attend Cardiac Rehabilitation for the exercise;Stress management education    Continue Psychosocial Services  Follow up required by staff  Vocational Rehabilitation: Provide vocational rehab assistance to qualifying candidates.   Vocational Rehab Evaluation & Intervention:   Education: Education Goals: Education classes will be provided on a variety of topics geared toward better understanding of heart health and risk factor modification. Participant will state understanding/return demonstration of topics presented as noted by education test scores.  Learning Barriers/Preferences:  Learning Barriers/Preferences - 01/29/22 1425       Learning Barriers/Preferences   Learning Barriers Sight    Learning Preferences Skilled Demonstration;Video             General Cardiac Education Topics:  AED/CPR: - Group verbal and written instruction with the use of models to demonstrate the basic use of the AED with the basic ABC's of resuscitation.   Anatomy and Cardiac Procedures: - Group verbal and visual presentation and models provide information about basic cardiac anatomy and function. Reviews the testing methods done to diagnose heart disease and the outcomes of the test results. Describes the treatment choices: Medical Management, Angioplasty, or Coronary Bypass Surgery for treating various heart conditions including Myocardial Infarction, Angina, Valve Disease, and Cardiac Arrhythmias.  Written material given at  graduation. Flowsheet Row Cardiac Rehab from 04/02/2022 in [facility] Cardiac and Pulmonary Rehab  Education need identified 02/05/22  Date 04/02/22  Educator [place]  Instruction Review Code 1- Verbalizes Understanding       Medication Safety: - Group verbal and visual instruction to review commonly prescribed medications for heart and lung disease. Reviews the medication, class of the drug, and side effects. Includes the steps to properly store meds and maintain the prescription regimen.  Written material given at graduation.   Intimacy: - Group verbal instruction through game format to discuss how heart and lung disease can affect sexual intimacy. Written material given at graduation..   Know Your Numbers and Heart Failure: - Group verbal and visual instruction to discuss disease risk factors for cardiac and pulmonary disease and treatment options.  Reviews associated critical values for Overweight/Obesity, Hypertension, Cholesterol, and Diabetes.  Discusses basics of heart failure: signs/symptoms and treatments.  Introduces Heart Failure Zone chart for action plan for heart failure.  Written material given at graduation. Flowsheet Row Cardiac Rehab from 04/02/2022 in [facility] Cardiac and Pulmonary Rehab  Date 02/06/22  Educator SB  Instruction Review Code 1- Verbalizes Understanding       Infection Prevention: - Provides verbal and written material to individual with discussion of infection control including proper hand washing and proper equipment cleaning during exercise session. Flowsheet Row Cardiac Rehab from 04/02/2022 in [facility] Cardiac and Pulmonary Rehab  Date 01/29/22  Educator [facility]  Instruction Review Code 1- Verbalizes Understanding       Falls Prevention: - Provides verbal and written material to individual with discussion of falls prevention and safety. Flowsheet Row Cardiac Rehab from 04/02/2022 in [facility] Cardiac and Pulmonary Rehab  Date 01/29/22  Educator [facility]  Instruction Review  Code 1- Verbalizes Understanding       Other: -Provides group and verbal instruction on various topics (see comments)   Knowledge Questionnaire Score:   Core Components/Risk Factors/Patient Goals at Admission:  Personal Goals and Risk Factors at Admission - 02/05/22 1224       Core Components/Risk Factors/Patient Goals on Admission    Weight Management Yes;Obesity    Intervention Weight Management: Develop a combined nutrition and exercise program designed to reach desired caloric intake, while maintaining appropriate intake of nutrient and fiber, sodium and fats, and appropriate energy expenditure required for the weight goal.;Weight  Management: Provide education and appropriate resources to help participant work on and attain dietary goals.;Weight Management/Obesity: Establish reasonable short term and long term weight goals.;Obesity: Provide education and appropriate resources to help participant work on and attain dietary goals.    Admit Weight 172 lb 11.2 oz (78.3 kg)    Goal Weight: Short Term 168 lb (76.2 kg)    Goal Weight: Long Term 160 lb (72.6 kg)    Expected Outcomes Short Term: Continue to assess and modify interventions until short term weight is achieved;Long Term: Adherence to nutrition and physical activity/exercise program aimed toward attainment of established weight goal;Weight Loss: Understanding of general recommendations for a balanced deficit meal plan, which promotes 1-2 lb weight loss per week and includes a negative energy balance of [phone] kcal/d;Understanding recommendations for meals to include 15-35% energy as protein, 25-35% energy from fat, 35-60% energy from carbohydrates, less than 23m of dietary cholesterol, 20-35 gm of total fiber daily;Understanding of distribution of calorie intake throughout the day with the consumption of 4-5 meals/snacks    Heart Failure Yes    Intervention Provide a combined exercise and nutrition program that is supplemented  with education, support and counseling about heart failure. Directed toward relieving symptoms such as shortness of breath, decreased exercise tolerance, and extremity edema.    Expected Outcomes Improve functional capacity of life;Short term: Attendance in program 2-3 days a week with increased exercise capacity. Reported lower sodium intake. Reported increased fruit and vegetable intake. Reports medication compliance.;Short term: Daily weights obtained and reported for increase. Utilizing diuretic protocols set by physician.;Long term: Adoption of self-care skills and reduction of barriers for early signs and symptoms recognition and intervention leading to self-care maintenance.    Hypertension Yes    Intervention Provide education on lifestyle modifcations including regular physical activity/exercise, weight management, moderate sodium restriction and increased consumption of fresh fruit, vegetables, and low fat dairy, alcohol moderation, and smoking cessation.;Monitor prescription use compliance.    Expected Outcomes Long Term: Maintenance of blood pressure at goal levels.;Short Term: Continued assessment and intervention until BP is < 140/985mHG in hypertensive participants. < 130/8014mG in hypertensive participants with diabetes, heart failure or chronic kidney disease.    Lipids Yes    Intervention Provide education and support for participant on nutrition & aerobic/resistive exercise along with prescribed medications to achieve LDL <79m74mDL >40mg61m Expected Outcomes Short Term: Participant states understanding of desired cholesterol values and is compliant with medications prescribed. Participant is following exercise prescription and nutrition guidelines.;Long Term: Cholesterol controlled with medications as prescribed, with individualized exercise RX and with personalized nutrition plan. Value goals: LDL < 79mg,91m > 40 mg.             Education:Diabetes - Individual verbal and  written instruction to review signs/symptoms of diabetes, desired ranges of glucose level fasting, after meals and with exercise. Acknowledge that pre and post exercise glucose checks will be done for 3 sessions at entry of program.   Core Components/Risk Factors/Patient Goals Review:   Goals and Risk Factor Review     Row Name 04/08/22 0804             Core Components/Risk Factors/Patient Goals Review   Personal Goals Review Weight Management/Obesity;Hypertension;Lipids       Review GeorgeKhyreing well in rehab.  Overall, his weight is trending down.  He is down under 170 and he wants to get under 160 lb.  His pressures are doing well in rehab.  He does not check them at home.  He does monitor his weight and saturations at home as pressures seem to be stable.       Expected Outcomes Short: Continue to work on weight loss long; Conitnue to monitor risk factors.                Core Components/Risk Factors/Patient Goals at Discharge (Final Review):   Goals and Risk Factor Review - 04/08/22 0804       Core Components/Risk Factors/Patient Goals Review   Personal Goals Review Weight Management/Obesity;Hypertension;Lipids    Review Foye is doing well in rehab.  Overall, his weight is trending down.  He is down under 170 and he wants to get under 160 lb.  His pressures are doing well in rehab.  He does not check them at home.  He does monitor his weight and saturations at home as pressures seem to be stable.    Expected Outcomes Short: Continue to work on weight loss long; Conitnue to monitor risk factors.             ITP Comments:  ITP Comments     Row Name 01/29/22 1422 02/05/22 1215 02/06/22 0906 02/18/22 0828 02/26/22 0955   ITP Comments Virtual Visit completed. Patient informed on EP and RD appointment and 6 Minute walk test. Patient also informed of patient health questionnaires on My Chart. Patient Verbalizes understanding. Visit diagnosis can be found in [facility] 01/09/2022.  Completed 6MWT and gym orientation. Initial ITP created and sent for review to Dr. Emily Filbert, Medical Director. First full day of exercise!  Patient was oriented to gym and equipment including functions, settings, policies, and procedures.  Patient's individual exercise prescription and treatment plan were reviewed.  All starting workloads were established based on the results of the 6 minute walk test done at initial orientation visit.  The plan for exercise progression was also introduced and progression will be customized based on patient's performance and goals. Fell in yard while doing yardwork, few scratches,no other injury 30 Day review completed. Medical Director ITP review done, changes made as directed, and signed approval by Medical Director.    [place]mita Name 03/11/22 1517 03/24/22 1358 03/25/22 1610 03/26/22 0826 04/23/22 1056   ITP Comments Patient called to let us know he will not be in this week as he had a new diagnosis (could not say what), unclear, but has doctor appts this week to follow up. Cardiologist appt on 11/16. Patient appts will be taken out this week and will call us on Thursday to give [place] a better explanation on what is going on. Provider notes from today, 11/14 are not complete yet. Spoke with patient, saw his cardiologist for a "black out" period in which he was told he was dehydrated. States his skin is all cleared up. No other issues. Patient to return thursday 11/30. Unableto complete goals at this time due to lack of attendance with Dr. Kendrick Fries and skin issues that were not safe for rehab. Patient is better and call cleared up Patient out since 10/31. 30 Day review completed. Medical Director ITP review done, changes made as directed, and signed approval by Medical Director. 30 Day review completed. Medical Director ITP review done, changes made as directed, and signed approval by Medical Director.            Comments:

## 2022-04-23 NOTE — Progress Notes (Signed)
Patient was insistent in leaving after aware of xray report.

## 2022-04-23 NOTE — Interval H&P Note (Signed)
History and Physical Interval Note:  04/23/2022 2:32 PM  Kyle Avila  has presented today for surgery, with the diagnosis of cardiomyopathy.  The various methods of treatment have been discussed with the patient and family. After consideration of risks, benefits and other options for treatment, the patient has consented to  Procedure(s): BIV PPM UPGRADE (N/A) as a surgical intervention.  [name] history has been reviewed, patient examined, no change in status, stable for surgery.  I have reviewed the patient's chart and labs.  Questions were answered to the patient's satisfaction.     [name]

## 2022-04-24 ENCOUNTER — Telehealth: Payer: Self-pay

## 2022-04-24 ENCOUNTER — Encounter: Payer: Self-pay | Admitting: Internal Medicine

## 2022-04-24 ENCOUNTER — Encounter: Payer: Medicare HMO | Admitting: *Deleted

## 2022-04-24 ENCOUNTER — Encounter: Payer: Self-pay | Admitting: Cardiovascular Disease

## 2022-04-24 ENCOUNTER — Encounter (HOSPITAL_COMMUNITY): Payer: Self-pay | Admitting: Internal Medicine

## 2022-04-24 NOTE — Telephone Encounter (Signed)
I, also, have been trying to reach patient this morning.  LM at 1249pm to have patient call back.

## 2022-04-24 NOTE — Telephone Encounter (Signed)
Patient returned call.  He described chest pain as tightness in chest at rest, no SOB, dizziness or lightheadedness. Pain resolved after 2 NTG.  Currently no chest pain. Only c/o of soreness to the left shoulder post implant procedure.   He does not think the wound site looks any better. He is uploading a picture of the area now and sending through [place].   Will wait for picture to come through and review all with Dr. Caryl Comes.

## 2022-04-24 NOTE — Telephone Encounter (Signed)
Dr. Caryl Comes reviewed pictures. Wound looks stable and healing. No concerns. Patient should continue to monitor, Wait 1 more day before removing bandage Contact us if any concerns.  For angina mentioned in other mychart message, patient should take NTG prn and notify Dr. Burt Knack if happens again.

## 2022-04-24 NOTE — Telephone Encounter (Signed)
Follow-up after same day discharge: Implant date: 04/23/22 MD: [name], MD Device: Medtronic CRT - D Pacemaker  Location: Right Chest   Wound check visit: 05/07/22 90 day MD follow-up: 07/28/22  Remote Transmission received:Patient is working with [employer] now to upgrade his remote connection.   Dressing/sling removed: Patient had some issues with his wound site initially;however, (refer to my chart messages from today), wound site reassessed and is improving, looks good.  He will wait to remove outer bandage tomorrow and call us if notes any increase in drainage in meantime.   Confirm [place] restart on: Resume Plavix 12/28  Eliquis Sunday PM dose , patient is aware.

## 2022-04-24 NOTE — Telephone Encounter (Signed)
-----   Message from [place], [place] sent at 04/23/2022  7:04 PM EST ----- Same day d/c  Upgrade to CRT-P  MDT  Resume Plavix 12/28 Eliquis Sunday PM dose

## 2022-04-24 NOTE — Telephone Encounter (Signed)
Left message for patient to call back  

## 2022-04-24 NOTE — Telephone Encounter (Signed)
See other my chart message from today for details.

## 2022-04-24 NOTE — Telephone Encounter (Signed)
See my chart message for 12/28 for more details.  Patient informed that per Dr. Caryl Comes,  If angina occurs again, take NTG and contact Dr. Burt Knack. Go to ER if unable to relieve or severe symptoms.

## 2022-04-29 ENCOUNTER — Encounter: Payer: Medicare HMO | Attending: Cardiovascular Disease

## 2022-04-29 DIAGNOSIS — Z48812 Encounter for surgical aftercare following surgery on the circulatory system: Secondary | ICD-10-CM | POA: Insufficient documentation

## 2022-04-29 DIAGNOSIS — Z9861 Coronary angioplasty status: Secondary | ICD-10-CM | POA: Insufficient documentation

## 2022-04-30 ENCOUNTER — Telehealth: Payer: Self-pay

## 2022-04-30 NOTE — Telephone Encounter (Signed)
Attempted to call patient regarding cardiac rehab. Has not attended since 12/21. Left message.

## 2022-05-01 ENCOUNTER — Encounter: Payer: Self-pay | Admitting: Internal Medicine

## 2022-05-01 ENCOUNTER — Encounter: Payer: Self-pay | Admitting: Pulmonary Disease

## 2022-05-01 ENCOUNTER — Other Ambulatory Visit: Payer: Self-pay | Admitting: *Deleted

## 2022-05-01 ENCOUNTER — Encounter: Payer: Medicare HMO | Admitting: *Deleted

## 2022-05-01 DIAGNOSIS — I6523 Occlusion and stenosis of bilateral carotid arteries: Secondary | ICD-10-CM

## 2022-05-05 ENCOUNTER — Telehealth: Payer: Self-pay | Admitting: Internal Medicine

## 2022-05-05 NOTE — Telephone Encounter (Signed)
Patient says that he is bleeding uncontrollably where the site the put needles in for blood flow during heart cath on 12/27.

## 2022-05-05 NOTE — Telephone Encounter (Signed)
Received call transferred directly from operator and spoke with patient.  He reports over the weekend he had increased bleeding from area on top side of left forearm about 3-4 inches up from his wrist.  Sounds like this was from an IV site.  Patient reports he has had off and on bleeding in this area since he was in the hospital.  He has had to keep a bandage on the area the entire time. Over the weekend he had increased bleeding from the site.  He describes area as looking like a brush burn now. Area is about 2-3 inches long.  Was oozing blood over the weekend.  He was able to stop the bleeding by applying a dressing.  Reports if he were to remove the dressing area would start oozing again.  I advised him to keep area covered with dry dressing for now and I would send message to Dr Caryl Comes to see if he wanted to make any changes. Patient has wound check scheduled for 1/10

## 2022-05-06 ENCOUNTER — Encounter: Payer: Self-pay | Admitting: Cardiovascular Disease

## 2022-05-06 DIAGNOSIS — H52203 Unspecified astigmatism, bilateral: Secondary | ICD-10-CM | POA: Diagnosis not present

## 2022-05-06 DIAGNOSIS — H353231 Exudative age-related macular degeneration, bilateral, with active choroidal neovascularization: Secondary | ICD-10-CM | POA: Diagnosis not present

## 2022-05-06 DIAGNOSIS — H0100B Unspecified blepharitis left eye, upper and lower eyelids: Secondary | ICD-10-CM | POA: Diagnosis not present

## 2022-05-06 DIAGNOSIS — H0100A Unspecified blepharitis right eye, upper and lower eyelids: Secondary | ICD-10-CM | POA: Diagnosis not present

## 2022-05-06 DIAGNOSIS — Z961 Presence of intraocular lens: Secondary | ICD-10-CM | POA: Diagnosis not present

## 2022-05-06 DIAGNOSIS — H524 Presbyopia: Secondary | ICD-10-CM | POA: Diagnosis not present

## 2022-05-07 ENCOUNTER — Ambulatory Visit: Payer: Medicare HMO | Attending: Internal Medicine

## 2022-05-07 ENCOUNTER — Telehealth: Payer: Self-pay

## 2022-05-07 DIAGNOSIS — H353211 Exudative age-related macular degeneration, right eye, with active choroidal neovascularization: Secondary | ICD-10-CM | POA: Diagnosis not present

## 2022-05-07 DIAGNOSIS — I442 Atrioventricular block, complete: Secondary | ICD-10-CM | POA: Diagnosis not present

## 2022-05-07 LAB — CUP PACEART INCLINIC DEVICE CHECK
Battery Remaining Longevity: 118 mo
Battery Voltage: 3.2 V
Brady Statistic RA Percent Paced: 0 %
Brady Statistic RV Percent Paced: 99.42 %
Date Time Interrogation Session: 20240110134225
Implantable Lead Connection Status: 753985
Implantable Lead Connection Status: 753985
Implantable Lead Connection Status: 753985
Implantable Lead Implant Date: 20070831
Implantable Lead Implant Date: 20070831
Implantable Lead Implant Date: 20231227
Implantable Lead Location: 753858
Implantable Lead Location: 753859
Implantable Lead Location: 753860
Implantable Lead Model: 3830
Implantable Lead Model: 4469
Implantable Lead Model: 4470
Implantable Lead Serial Number: 485949
Implantable Lead Serial Number: 553665
Implantable Pulse Generator Implant Date: 20231227
Lead Channel Impedance Value: 266 Ohm
Lead Channel Impedance Value: 285 Ohm
Lead Channel Impedance Value: 323 Ohm
Lead Channel Impedance Value: 361 Ohm
Lead Channel Impedance Value: 399 Ohm
Lead Channel Impedance Value: 418 Ohm
Lead Channel Impedance Value: 418 Ohm
Lead Channel Impedance Value: 513 Ohm
Lead Channel Impedance Value: 589 Ohm
Lead Channel Pacing Threshold Amplitude: 0.75 V
Lead Channel Pacing Threshold Amplitude: 0.75 V
Lead Channel Pacing Threshold Amplitude: 1 V
Lead Channel Pacing Threshold Pulse Width: 0.4 ms
Lead Channel Pacing Threshold Pulse Width: 0.4 ms
Lead Channel Pacing Threshold Pulse Width: 0.4 ms
Lead Channel Sensing Intrinsic Amplitude: 1.5 mV
Lead Channel Sensing Intrinsic Amplitude: 1.5 mV
Lead Channel Sensing Intrinsic Amplitude: 27.125 mV
Lead Channel Sensing Intrinsic Amplitude: 7.25 mV
Lead Channel Setting Pacing Amplitude: 2 V
Lead Channel Setting Pacing Amplitude: 2.25 V
Lead Channel Setting Pacing Amplitude: 3.5 V
Lead Channel Setting Pacing Pulse Width: 0.4 ms
Lead Channel Setting Pacing Pulse Width: 0.4 ms
Lead Channel Setting Sensing Sensitivity: 2.8 mV
Zone Setting Status: 755011
Zone Setting Status: 755011

## 2022-05-07 NOTE — Telephone Encounter (Signed)
Pt seen in device clinic today for a wound check.  Pt states he has had worsening angina over the last month or 2.  He states walking from his car to an office visit he has to stop and take a nitro tab.  He said he has been taking more nitro than he usually does.  Pt also complains of heart rate increasing rapidly with minimal exertion.  Discussed with AT.  Will reprogram DDD 60 as previously programmed at this time to determine if this change correlates at all with his increase in chest pain.  Additionally discussed with Dr. Burt Knack.  Per Dr. Burt Knack schedule follow up with him Monday 05/12/22 and give ER precautions.  Returned call to Pt.  Advised of upcoming appt on Monday with Dr. Burt Knack and went to seek urgent care.  He indicates understanding and is grateful for follow up.  Will continue to monitor.  Side note-Pt had a skin tear to his left fore arm post hospitalization.  Removed band aids applied by Pt.  Small skin tear noted with sanguinous drainage.  Area cleansed and redressed.  Sent band aids home with Pt for continued wound care.

## 2022-05-07 NOTE — Progress Notes (Signed)
Wound check appointment. Steri-strips removed. Wound without redness or edema. Incision edges approximated, wound well healed. Normal device function. Thresholds, sensing, and impedances consistent with implant measurements. Device programmed at 3.5V/auto capture programmed on for extra safety margin until 3 month visit. Histogram distribution appropriate for patient and level of activity. No mode switches or high ventricular rates noted.   Changes made this session:  Patient educated about wound care, arm mobility, lifting restrictions. ROV in 3 months with implanting physician. Patient complaining of feeling "like crap", feels heart start racing to a high limit with any level of activity: walking, climbing stairs.  Myrtie Hawk RN assisting with check today discussed patient's symptoms with Oda Kilts, PA-C. It was determined to change the programming from DDDR to DDD, turning off rate response for now.  Will see how patient does and re-assess at appointment with Dr. Caryl Comes in 3 months. Patient knows that he can call us sooner if any concerns.

## 2022-05-07 NOTE — Patient Instructions (Signed)
   After Your Pacemaker   Monitor your pacemaker site for redness, swelling, and drainage. Call the device clinic at [phone] if you experience these symptoms or fever/chills.  Your incision was closed with Steri-strips or staples:  You may shower 7 days after your procedure and wash your incision with soap and water. Avoid lotions, ointments, or perfumes over your incision until it is well-healed.  You may use a hot tub or a pool after your wound check appointment if the incision is completely closed.  Do not lift, push or pull greater than 10 pounds with the affected arm until 6 weeks after your procedure. There are no other restrictions in arm movement after your wound check appointment. UNTIL AFTER FEBRUARY 7TH.   You may drive, unless driving has been restricted by your healthcare providers.   Remote monitoring is used to monitor your pacemaker from home. This monitoring is scheduled every 91 days by our office. It allows [place] to keep an eye on the functioning of your device to ensure it is working properly. You will routinely see your Electrophysiologist annually (more often if necessary).

## 2022-05-08 ENCOUNTER — Encounter: Payer: Medicare HMO | Admitting: *Deleted

## 2022-05-08 ENCOUNTER — Encounter: Payer: Self-pay | Admitting: Internal Medicine

## 2022-05-08 NOTE — Telephone Encounter (Signed)
Pt kept his Wound check appointment 05/07/2022 and skin tear was addressed during that appointment.  See encounter for complete details.

## 2022-05-09 ENCOUNTER — Telehealth: Payer: Self-pay | Admitting: Pulmonary Disease

## 2022-05-09 ENCOUNTER — Encounter: Payer: Self-pay | Admitting: Pulmonary Disease

## 2022-05-09 NOTE — Telephone Encounter (Signed)
Patient missed a call from the office.  He would like a call back at [phone]

## 2022-05-09 NOTE — Telephone Encounter (Signed)
Called patient and he states that he is finishing his '5mg'$  of Prednisone by the end of the month. He is not wanting to continue the Prednisone if Dr Silas Flood can help it. But he is asking for recommendations for a antihistamine to help prevent the mucus build up. He states he believes he can not take decongestants due to kidney function. But he states he is waking up now with a lot of mucus build up in the morning and through out the day. He states it is clear and sometimes yellow. But that it is thick.  He has follow up with Dr Silas Flood on 05/14/2022.  Please advise sir   PS- He states he was using saline solution in the past and still has some but wants to check with Dr Silas Flood first before adding any medications

## 2022-05-10 IMAGING — CT CT L SPINE W/O CM
1 of 7 series · 5 of 14 positions shown, 7 images · non-contrast
Comparison: 06/06/2013

CLINICAL DATA: Evaluate for nerve root impingement. Low back pain
and right leg pain for 3 weeks

EXAM:
CT LUMBAR SPINE WITHOUT CONTRAST
TECHNIQUE: Multidetector CT imaging of the lumbar spine was performed without
intravenous contrast administration. Multiplanar CT image
reconstructions were also generated.

[Series 3: l spine soft · axial · 0.32mm/px · z∈[+935,+1070]mm · 5 of 69 slices shown, 7 images]
[im 12/69  soft-tissue]
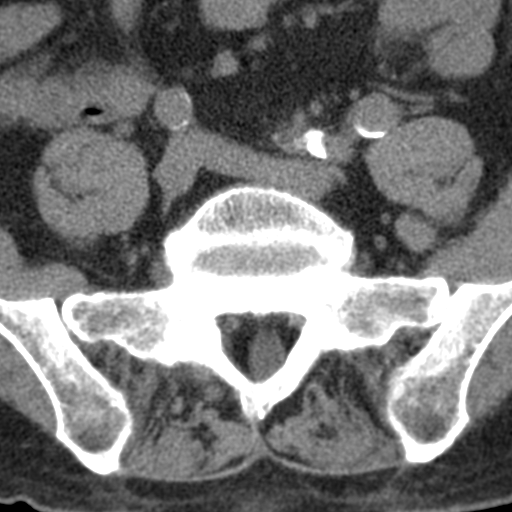
[im 12/69  bone]
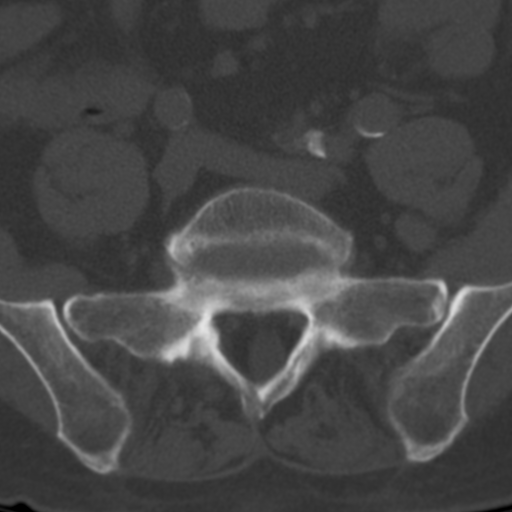
[im 23/69  bone]
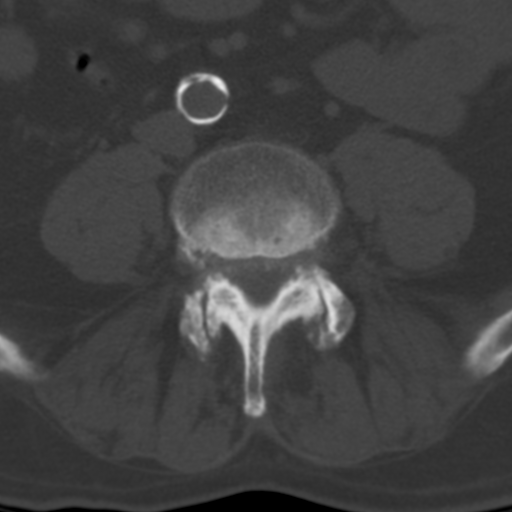
[im 35/69  bone]
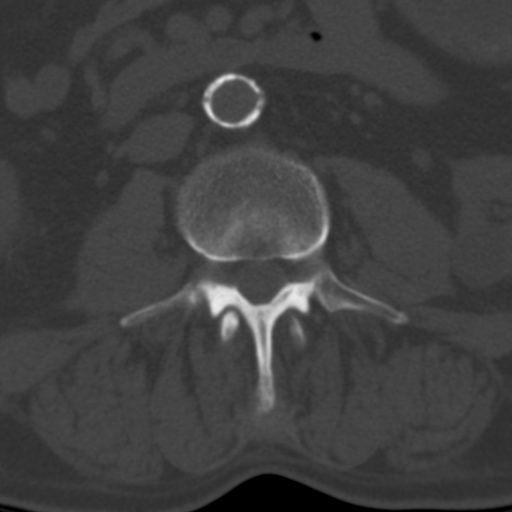
[im 46/69  bone]
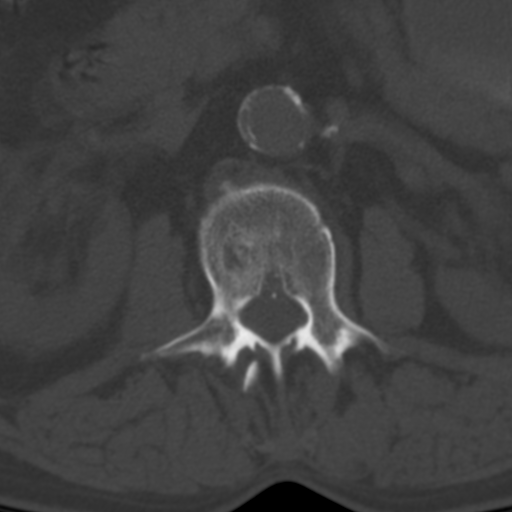
[im 57/69  soft-tissue]
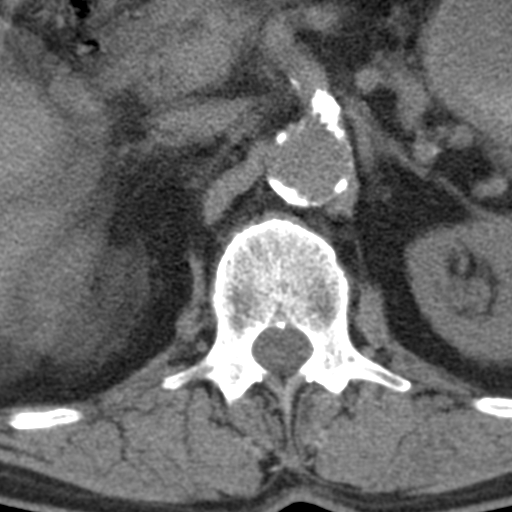
[im 57/69  bone]
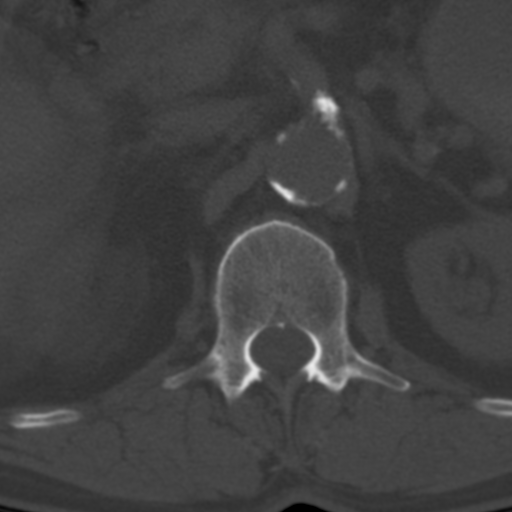

[5 of 14 positions shown; findings below may reference images not displayed]

FINDINGS: Segmentation: 5 lumbar type vertebrae

Alignment: Retrolisthesis at L1-2 measuring 3 mm. Mild
anterolisthesis at L4-5.

Vertebrae: No acute fracture or focal pathologic process.

Paraspinal and other soft tissues: Confluent atheromatous
calcification of the aorta and iliacs.

Disc levels:

T12- L1: Unremarkable.

L1-L2: Disc collapse and endplate sclerosis with ridging. Moderate
right foraminal stenosis. Mild spinal stenosis

L2-L3: Disc collapse and endplate degeneration with mild ridging.
Moderate biforaminal stenosis. Mild spinal stenosis.

L3-L4: Disc narrowing and bulging with annular calcification. Mild
facet spurring. Left more than right foraminal impingement that is
moderate to advanced. Mild to moderate spinal stenosis

L4-L5: Facet osteoarthritis with spurring and mild anterolisthesis.
The disc is narrowed and bulging. Advanced spinal stenosis.
Biforaminal impingement that is moderate to advanced

L5-S1:Disc narrowing and bulging with facet spurring asymmetric to
the right. Right more than left foraminal impingement, very advanced
on the right based on axial images. Patent spinal canal
IMPRESSION: 1. Diffuse degenerative disease with generalized progression from
8207.
2. Spinal stenosis at L4-5 that is advanced.
3. Foraminal narrowing at each lumbar level as described above;
foraminal impingement is advanced at L3-4, L4-5, and L5-S1.

## 2022-05-11 NOTE — Telephone Encounter (Signed)
Saline solution is ok. Recommend cetirizine at night for antihistamine. Flonase ok too. May need to see ENT again if this does not work.

## 2022-05-12 ENCOUNTER — Encounter: Payer: Self-pay | Admitting: Cardiovascular Disease

## 2022-05-12 ENCOUNTER — Encounter: Payer: Self-pay | Admitting: Pulmonary Disease

## 2022-05-12 ENCOUNTER — Ambulatory Visit: Payer: Medicare HMO | Attending: Cardiovascular Disease | Admitting: Cardiovascular Disease

## 2022-05-12 VITALS — BP 132/84 | HR 85 | Ht 68.0 in | Wt 174.4 lb

## 2022-05-12 DIAGNOSIS — N184 Chronic kidney disease, stage 4 (severe): Secondary | ICD-10-CM

## 2022-05-12 DIAGNOSIS — I5042 Chronic combined systolic (congestive) and diastolic (congestive) heart failure: Secondary | ICD-10-CM | POA: Diagnosis not present

## 2022-05-12 DIAGNOSIS — I25119 Atherosclerotic heart disease of native coronary artery with unspecified angina pectoris: Secondary | ICD-10-CM

## 2022-05-12 DIAGNOSIS — I483 Typical atrial flutter: Secondary | ICD-10-CM | POA: Diagnosis not present

## 2022-05-12 DIAGNOSIS — I6523 Occlusion and stenosis of bilateral carotid arteries: Secondary | ICD-10-CM

## 2022-05-12 DIAGNOSIS — E785 Hyperlipidemia, unspecified: Secondary | ICD-10-CM | POA: Diagnosis not present

## 2022-05-12 NOTE — Telephone Encounter (Signed)
Called and left patient and voicemail going over MHs recommendations. But I also told patient that I would send him a mychart message because I know that is easier to get in touch with him on. Nothing further needed

## 2022-05-12 NOTE — Patient Instructions (Signed)
Medication Instructions:  Your physician recommends that you continue on your current medications as directed. Please refer to the Current Medication list given to you today.  *If you need a refill on your cardiac medications before your next appointment, please call your pharmacy*   Lab Work: CBC, BMET 05/16/22 If you have labs (blood work) drawn today and your tests are completely normal, you will receive your results only by: [facility] (if you have MyChart) OR A paper copy in the mail If you have any lab test that is abnormal or we need to change your treatment, we will call you to review the results.  Testing/Procedures: L Heart Catheterization Your physician has requested that you have a cardiac catheterization. Cardiac catheterization is used to diagnose and/or treat various heart conditions. Doctors may recommend this procedure for a number of different reasons. The most common reason is to evaluate chest pain. Chest pain can be a symptom of coronary artery disease (CAD), and cardiac catheterization can show whether plaque is narrowing or blocking your heart's arteries. This procedure is also used to evaluate the valves, as well as measure the blood flow and oxygen levels in different parts of your heart. For further information please visit [email or web address]. Please follow instruction sheet, as given.  Follow-Up: At [facility], you and your health needs are our priority.  As part of our continuing mission to provide you with exceptional heart care, we have created designated Provider Care Teams.  These Care Teams include your primary Cardiologist (physician) and Advanced Practice Providers (APPs -  Physician Assistants and Nurse Practitioners) who all work together to provide you with the care you need, when you need it.  Your next appointment:   7 week(s)  Provider:   Richardson Dopp, PA      Other Instructions       Cardiac/Peripheral Catheterization   You  are scheduled for a Cardiac Catheterization on Wednesday, January 24 with Dr. [name].  1. Please arrive at the Main Entrance A at [facility]: [place], [place] [address] on January 24 at 10:00 AM (This time is two hours before your procedure to ensure your preparation). Free valet parking service is available. You will check in at ADMITTING. The support person will be asked to wait in the waiting room.  It is OK to have someone drop you off and come back when you are ready to be discharged.        Special note: Every effort is made to have your procedure done on time. Please understand that emergencies sometimes delay scheduled procedures.   . 2. Diet: Do not eat solid foods after midnight.  You may have clear liquids until 5 AM the day of the procedure.  3. Labs: 05/16/22  4. Medication instructions in preparation for your procedure:   Contrast Allergy: No DO NOT TAKE Furosemide day of procedure DO NOT TAKE Tamsulosin day of procedure DO NOT TAKE Jardiance day of procedure DO NOT TAKE Eliquis for 2 days prior to procedure  On the morning of your procedure, take Aspirin 81 mg and Clopidogrel '75mg'$  any morning medicines NOT listed above.  You may use sips of water.  5. Plan to go home the same day, you will only stay overnight if medically necessary. 6. You MUST have a responsible adult to drive you home. 7. An adult MUST be with you the first 24 hours after you arrive home. 8. Bring a current list of your medications,  and the last time and date medication taken. 9. Bring ID and current insurance cards. 10.Please wear clothes that are easy to get on and off and wear slip-on shoes.  Thank you for allowing [place] to care for you!   -- [facility]

## 2022-05-12 NOTE — H&P (View-Only) (Signed)
Cardiology Office Note:    Date:  05/12/2022   ID:  Kyle Avila, DOB [birth year], MRN [id number]  PCP:  Nelva Bush, [place] Prov[facility] Cardiologist:  [name], MD Electrophysiologist:  [name], MD     Referring MD: [name], MD   Chief Complaint  Patient presents with   Coronary Artery Disease    History of Present Illness:    Kyle Avila is a 77 y.o. 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  I saw him last in October 2023.  Since that time he has seen multiple providers including advanced heart failure consultation, nephrology, and atrial fibrillation clinic/EP.  He underwent generator change with left bundle branch lead placement April 23, 2022 by Dr. Caryl Comes.  The patient has noticed over the last month that he has had progressive symptoms of angina.  He describes his typical chest discomfort with substernal pressure occurring with minimal activity.  He is taking sublingual nitroglycerin frequently with a good response to this.  He stops and rests, takes nitroglycerin, and his symptoms resolved within a few minutes.  He has had symptoms with low-level activity such as walking short distances consistent with CCS functional class III angina.  No recent shortness of breath,  orthopnea, or heart palpitations.  He was seen in the device clinic last week and his rate responsiveness was turned off and he was programmed at a heart rate of 60 bpm.  He has continued to experience angina over the weekend but no significant change.  Past Medical History:  Diagnosis Date   Arthritis    "some in my back" (04/14/2017)   CAD (coronary artery disease)    a.  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; lupus attacked them" (04/14/2017)   Mitral insufficiency    Moderate mitral regurgitation 10/02/2021   Echocardiogram 6/23: EF 26, global HK, mild ASH, mildly reduced RVSF, normal PASP, RVSP 31, mod LAD, mild RAE, mod MR, trivial AI, AV sclerosis w/o stenosis   Myocardial infarction ([place])    "I've had some mild one" (04/14/2017)   Presence of permanent cardiac pacemaker    Skin cancer    "nose"   VT (ventricular tachycardia)-nonsustained 05/20/2011    Past Surgical History:  Procedure Laterality Date   ACHILLES TENDON REPAIR Right    BIV UPGRADE N/A 04/23/2022   Procedure: BIV PPM UPGRADE;  Surgeon: Deboraha Sprang, MD;  Location: [facility];  Service: Cardiovascular;  Laterality: N/A;   CARDIAC CATHETERIZATION     CARDIAC CATHETERIZATION N/A 11/03/2014   Procedure: Left Heart Cath and Cors/Grafts Angiography;  Surgeon: [name], MD;  Location: [facility];  Service: Cardiovascular;  Laterality: N/A;   CARDIAC CATHETERIZATION N/A 11/03/2014   Procedure: Coronary Stent Intervention;  Surgeon: [name], MD;  Location: [facility];  Service: Cardiovascular;  Laterality: N/A;   CARDIOVERSION N/A 07/17/2021   Procedure: CARDIOVERSION;  Surgeon: [name], MD;  Location: [place] [facility];  Service:  Cardiovascular;  Laterality: N/A;   CARDIOVERSION N/A 10/08/2021   Procedure: CARDIOVERSION;  Surgeon: Freada Bergeron, MD;  Location: [facility];  Service: Cardiovascular;  Laterality: N/A;   CAROTID ENDARTERECTOMY  2002   "? side"   CATARACT EXTRACTION W/ INTRAOCULAR LENS IMPLANT     "? side"   CORONARY ANGIOPLASTY WITH STENT PLACEMENT  04/14/2017   CORONARY ANGIOPLASTY WITH STENT PLACEMENT  11/03/2014   SVG PVA   CORONARY ARTERY BYPASS GRAFT     with redo cabg   CORONARY BALLOON ANGIOPLASTY N/A 08/15/2021   Procedure: CORONARY BALLOON ANGIOPLASTY;  Surgeon: Burnell Blanks, MD;  Location: [facility] [facility];  Service: Cardiovascular;  Laterality: N/A;   CORONARY STENT INTERVENTION N/A 10/18/2019   Procedure: CORONARY STENT INTERVENTION;  Surgeon: [name], MD;  Location: [place] [facility];  Service: Cardiovascular;  Laterality: N/A;   CORONARY/GRAFT ANGIOGRAPHY N/A 10/18/2019   Procedure: CORONARY/GRAFT ANGIOGRAPHY;  Surgeon: [name], MD;  Location: [place] [facility];  Service: Cardiovascular;  Laterality: N/A;   EP IMPLANTABLE DEVICE N/A 09/13/2014   Procedure: PPM Generator Changeout;  Surgeon: Deboraha Sprang, MD;  Location: [facility];  Service: Cardiovascular;  Laterality: N/A;   LEFT HEART CATH AND CORS/GRAFTS ANGIOGRAPHY N/A 04/14/2017   Procedure: LEFT HEART CATH AND CORS/GRAFTS ANGIOGRAPHY;  Surgeon: [name], MD;  Location: [facility];  Service: Cardiovascular;  Laterality: N/A;   LEFT HEART CATH AND CORS/GRAFTS ANGIOGRAPHY N/A 08/15/2021   Procedure: LEFT HEART CATH AND CORS/GRAFTS ANGIOGRAPHY;  Surgeon: Burnell Blanks, MD;  Location: [facility];  Service: Cardiovascular;  Laterality: N/A;   MOHS SURGERY     "just outside my nose"   PACEMAKER INSERTION  2007   TONSILLECTOMY  1958    Current Medications: Current Meds  Medication Sig   acetaminophen (TYLENOL) 500 MG tablet Take 1,000 mg by mouth every 6  (six) hours as needed for moderate pain.   allopurinol (ZYLOPRIM) 100 MG tablet Take 100 mg by mouth at bedtime.   amLODipine (NORVASC) 2.5 MG tablet Take 1 tablet (2.5 mg total) by mouth daily.   apixaban (ELIQUIS) 5 MG TABS tablet Take 1 tablet (5 mg total) by mouth 2 (two) times daily.   azelastine (ASTELIN) 0.1 % nasal spray Place 1 spray into both nostrils 2 (two) times daily. Use in each nostril as directed   clopidogrel (PLAVIX) 75 MG tablet Take 75 mg by mouth at bedtime.   empagliflozin (JARDIANCE) 10 MG TABS tablet TAKE 1 TABLET BY MOUTH DAILY BEFORE BREAKFAST.   furosemide (LASIX) 80 MG tablet Take 1 tablet (80 mg total) by mouth daily.   isosorbide mononitrate (IMDUR) 120 MG 24 hr tablet TAKE 1 TABLET (120 MG TOTAL) BY MOUTH DAILY.   ketoconazole (NIZORAL) 2 % cream Apply 1 application. topically daily as needed (itching toes).   metoprolol succinate (TOPROL-XL) 50 MG 24 hr tablet TAKE 1 TABLET  TWO TIMES DAILY. TAKE WITH OR IMMEDIATELY FOLLOWING MEALS.   predniSONE (DELTASONE) 10 MG tablet Take 1 tablet (10 mg total) by mouth daily with breakfast. (Patient taking differently: Take 5 mg by mouth daily with breakfast.)   predniSONE (DELTASONE) 20 MG tablet Take 20 mg by mouth daily with breakfast.   REPATHA SURECLICK 998 MG/ML SOAJ Inject 140 mg into the skin every 14 (fourteen) days.   tamsulosin (FLOMAX) 0.4 MG CAPS Take 0.4 mg by mouth daily.     Allergies:   Amiodarone   Social History   Socioeconomic History   Marital status: Married    Spouse name: Not on file   Number of children: Not on file   Years of education: Not on file   Highest education level: Not on file  Occupational History   Not on file  Tobacco Use   Smoking status: Never    Passive exposure: Yes   Smokeless tobacco: Never   Tobacco comments:    Never smoke 10/21/21  Vaping Use   Vaping Use: Never used  Substance and Sexual Activity   Alcohol use: Yes    Alcohol/week: 3.0 standard drinks of  alcohol    Types: 1 Glasses of wine, 1 Cans of beer, 1 Standard drinks or equivalent per week    Comment: 2-3 drinks per week 10/21/21   Drug use: No   Sexual activity: Not Currently  Other Topics Concern   Not on file  Social History Narrative   Not on file   Social Determinants of Health   Financial Resource Strain: Not on file  Food Insecurity: Not on file  Transportation Needs: Not on file  Physical Activity: Insufficiently Active (06/25/2017)   Exercise Vital Sign    Days of Exercise per Week: 4 days    Minutes of Exercise per Session: 30 min  Stress: No Stress Concern Present (06/25/2017)   [place]    Feeling of Stress : Not at all  Social Connections: Not on file     Family History: [name] family history includes Cancer in his mother; Heart attack in his father; Heart disease in his father.  ROS:   Please see the history of present illness.    All other systems reviewed and are negative.  EKGs/Labs/Other Studies Reviewed:    The following studies were reviewed today: Patient's most recent cardiac catheterization study from April 2023 is personally reviewed when he underwent scoring balloon angioplasty of the stented segments in the saphenous vein graft to PDA.  EKG:  EKG is ordered today.  The ekg ordered today demonstrates atrial flutter, ventricular pacing 84 bpm  Recent Labs: 03/05/2022: B Natriuretic Peptide 299.0 04/07/2022: ALT 47; BUN 41; Creatinine, Ser 2.47; Hemoglobin 13.8; Platelets 169; Potassium 4.2; Sodium 143; TSH 4.800  Recent Lipid Panel    Component Value Date/Time   CHOL 96 (L) 04/07/2022 0915   TRIG 145 04/07/2022 0915   HDL 57 04/07/2022 0915   CHOLHDL 1.7 04/07/2022 0915   CHOLHDL 2.4 10/19/2019 0306   VLDL 55 (H) 10/19/2019 0306   LDLCALC 15 04/07/2022 0915   LDLDIRECT 47 10/28/2019 0820     Risk Assessment/Calculations:    CHA2DS2-VASc Score = 5   This  indicates a 7.2% annual risk of stroke. [name] score is based upon: CHF History: 1 HTN History: 1 Diabetes History: 0 Stroke History: 0 Vascular Disease History: 1 Age Score: 2 Gender Score: 0  Physical Exam:    VS:  BP 132/84 (BP Location: Right Arm, Patient Position: Sitting, Cuff Size: Normal)   Pulse 85   Ht '5\' 8"'$  (1.727 m)   Wt 174 lb 6.4 oz (79.1 kg)   SpO2 92%   BMI 26.52 kg/m     Wt Readings from Last 3 Encounters:  05/12/22 174 lb 6.4 oz (79.1 kg)  04/23/22 177 lb (80.3 kg)  04/10/22 177 lb (80.3 kg)     GEN:  Well nourished, well developed in no acute distress HEENT: Normal NECK: No JVD; No carotid bruits LYMPHATICS: No lymphadenopathy CARDIAC: RRR, 2/6 systolic ejection murmur at the right upper sternal border RESPIRATORY:  Clear to auscultation without rales, wheezing or rhonchi  ABDOMEN: Soft, non-tender, non-distended MUSCULOSKELETAL: Trace bilateral pretibial edema; No deformity  SKIN: Warm and dry NEUROLOGIC:  Alert and oriented x 3 PSYCHIATRIC:  Normal affect   ASSESSMENT:    1. Coronary artery disease involving native coronary artery of native heart with angina pectoris ([place])   2. Chronic combined systolic and diastolic CHF (congestive heart failure) ([place])   3. Hyperlipidemia LDL goal <70   4. CKD (chronic kidney disease) stage 4, GFR 15-29 ml/min (HCC)   5. Bilateral carotid artery stenosis   6. Typical atrial flutter (HCC)    PLAN:    In order of problems listed above:  The patient is experiencing CCS functional class III symptoms of worsening angina requiring frequent use of nitroglycerin despite aggressive medical treatment with his antianginal program including amlodipine, metoprolol succinate, and high-dose isosorbide.  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  I looked back over time and he has required PCI about every other year.   Unfortunately, he is having severe recurrent symptoms now 9 months out from his last procedure.  We discussed potential treatment options including escalation of medical therapy versus treatment with cath and PCI.  Shared decision making conversation occurs in the context of his chronic medical problems including CKD stage IV.  We agree that repeat cardiac catheterization is indicated as he likely has severe in-stent restenosis again. I have reviewed the risks, indications, and alternatives to cardiac catheterization, possible angioplasty, and stenting with the patient. Risks include but are not limited to bleeding, infection, vascular injury, stroke, myocardial infection, arrhythmia, kidney injury, radiation-related injury in the case of prolonged fluoroscopy use, emergency cardiac surgery, and death. The patient understands the risks of serious complication is 1-2 in 1638 with diagnostic cardiac cath and 1-2% or less with angioplasty/stenting.  The patient will hold apixaban before the procedure per protocol.  He will continue on clopidogrel.  He has been done from femoral access. Clinically stable, continue current medical therapy.  Tolerating Jardiance well. Treated with Repatha Last LDL cholesterol was 15 Has not been a candidate for ACE/ARB/Entresto due to his degree of kidney disease.  Will minimize contrast at the time of his upcoming procedure. Recent carotid ultrasound is personally reviewed and shows patent right carotid endarterectomy site and less than 40% left carotid stenosis.  Continue medical management. 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 Face and he is willing to proceed.    Medication Adjustments/Labs and Tests Ordered: Current medicines are reviewed at length with the patient today.  Concerns regarding medicines are outlined above.  Orders Placed This Encounter  Procedures   CBC   Basic metabolic panel   EKG 07-EMLJ   No orders of the defined types were placed in this encounter.   Patient Instructions  Medication Instructions:  Your physician recommends that you continue on your current medications as directed. Please refer to the Current Medication list given to you today.  *If you need a refill on your cardiac medications before your next appointment, please call your pharmacy*   Lab Work: CBC, BMET 05/16/22 If you have labs (blood work) drawn today and your tests are completely normal, you will receive your results only by: [place] (if you have MyChart) OR A paper copy in the mail If you have any lab test that is abnormal or we need to change your treatment, we will call you to review the results.  Testing/Procedures: L Heart Catheterization Your physician has requested that you have a cardiac catheterization. Cardiac catheterization is used to diagnose and/or treat various heart conditions. Doctors may recommend this procedure for a number of different reasons. The most common reason is to evaluate chest pain. Chest pain can be a symptom of coronary artery disease (CAD), and cardiac catheterization can show whether plaque is narrowing or blocking your heart's arteries. This procedure is also used to evaluate the valves, as well as measure the blood flow and oxygen levels in different parts of your heart. For further information please visit [email or web address]. Please follow instruction sheet, as given.  Follow-Up: At [facility], you and your health needs are our priority.  As part of our continuing  mission to provide you with exceptional heart care, we have created designated Provider Care Teams.  These Care Teams include your primary Cardiologist (physician) and Advanced Practice Providers (APPs -  Physician Assistants and Nurse Practitioners) who all work together to provide you with the care you need, when you need it.  Your next appointment:   7 week(s)  Provider:   Richardson Dopp, PA      Other Instructions       Cardiac/Peripheral Catheterization   You are scheduled for a Cardiac Catheterization on Wednesday, January 24 with Dr. [name].  1. Please arrive at the Main Entrance A at [facility]: [address], [place] [address] on January 24 at 10:00 AM (This time is two hours before your procedure to ensure your preparation). Free valet parking service is available. You will check in at ADMITTING. The support person will be asked to wait in the waiting room.  It is OK to have someone drop you off and come back when you are ready to be discharged.        Special note: Every effort is made to have your procedure done on time. Please understand that emergencies sometimes delay scheduled procedures.   . 2. Diet: Do not eat solid foods after midnight.  You may have clear liquids until 5 AM the day of the procedure.  3. Labs: 05/16/22  4. Medication instructions in preparation for your procedure:   Contrast Allergy: No DO NOT TAKE Furosemide day of procedure DO NOT TAKE Tamsulosin day of procedure DO NOT TAKE Jardiance day of procedure DO NOT TAKE Eliquis for 2 days prior to procedure  On  the morning of your procedure, take Aspirin 81 mg and Clopidogrel '75mg'$  any morning medicines NOT listed above.  You may use sips of water.  5. Plan to go home the same day, you will only stay overnight if medically necessary. 6. You MUST have a responsible adult to drive you home. 7. An adult MUST be with you the first 24 hours after you arrive home. 8. Bring a  current list of your medications, and the last time and date medication taken. 9. Bring ID and current insurance cards. 10.Please wear clothes that are easy to get on and off and wear slip-on shoes.  Thank you for allowing [place] to care for you!   -- [facility] Invasive Cardiovascular services    Signed, [name], MD  05/12/2022 12:57 PM    [place]

## 2022-05-12 NOTE — Progress Notes (Signed)
Cardiology Office Note:    Date:  05/12/2022   ID:  Kyle Avila, DOB [birth year], MRN [id number]  PCP:  Nelva Bush, [place] Prov[facility] Cardiologist:  [name], MD Electrophysiologist:  [name], MD     Referring MD: [name], MD   Chief Complaint  Patient presents with   Coronary Artery Disease    History of Present Illness:    Kyle Avila is a 77 y.o. 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  I saw him last in October 2023.  Since that time he has seen multiple providers including advanced heart failure consultation, nephrology, and atrial fibrillation clinic/EP.  He underwent generator change with left bundle branch lead placement April 23, 2022 by Dr. Caryl Comes.  The patient has noticed over the last month that he has had progressive symptoms of angina.  He describes his typical chest discomfort with substernal pressure occurring with minimal activity.  He is taking sublingual nitroglycerin frequently with a good response to this.  He stops and rests, takes nitroglycerin, and his symptoms resolved within a few minutes.  He has had symptoms with low-level activity such as walking short distances consistent with CCS functional class III angina.  No recent shortness of breath,  orthopnea, or heart palpitations.  He was seen in the device clinic last week and his rate responsiveness was turned off and he was programmed at a heart rate of 60 bpm.  He has continued to experience angina over the weekend but no significant change.  Past Medical History:  Diagnosis Date   Arthritis    "some in my back" (04/14/2017)   CAD (coronary artery disease)    a.  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; lupus attacked them" (04/14/2017)   Mitral insufficiency    Moderate mitral regurgitation 10/02/2021   Echocardiogram 6/23: EF 58, global HK, mild ASH, mildly reduced RVSF, normal PASP, RVSP 31, mod LAD, mild RAE, mod MR, trivial AI, AV sclerosis w/o stenosis   Myocardial infarction ([place])    "I've had some mild one" (04/14/2017)   Presence of permanent cardiac pacemaker    Skin cancer    "nose"   VT (ventricular tachycardia)-nonsustained 05/20/2011    Past Surgical History:  Procedure Laterality Date   ACHILLES TENDON REPAIR Right    BIV UPGRADE N/A 04/23/2022   Procedure: BIV PPM UPGRADE;  Surgeon: Deboraha Sprang, MD;  Location: [facility];  Service: Cardiovascular;  Laterality: N/A;   CARDIAC CATHETERIZATION     CARDIAC CATHETERIZATION N/A 11/03/2014   Procedure: Left Heart Cath and Cors/Grafts Angiography;  Surgeon: [name], MD;  Location: [facility];  Service: Cardiovascular;  Laterality: N/A;   CARDIAC CATHETERIZATION N/A 11/03/2014   Procedure: Coronary Stent Intervention;  Surgeon: [name], MD;  Location: [facility];  Service: Cardiovascular;  Laterality: N/A;   CARDIOVERSION N/A 07/17/2021   Procedure: CARDIOVERSION;  Surgeon: [name], MD;  Location: [facility];  Service:  Cardiovascular;  Laterality: N/A;   CARDIOVERSION N/A 10/08/2021   Procedure: CARDIOVERSION;  Surgeon: Freada Bergeron, MD;  Location: [facility];  Service: Cardiovascular;  Laterality: N/A;   CAROTID ENDARTERECTOMY  2002   "? side"   CATARACT EXTRACTION W/ INTRAOCULAR LENS IMPLANT     "? side"   CORONARY ANGIOPLASTY WITH STENT PLACEMENT  04/14/2017   CORONARY ANGIOPLASTY WITH STENT PLACEMENT  11/03/2014   SVG PVA   CORONARY ARTERY BYPASS GRAFT     with redo cabg   CORONARY BALLOON ANGIOPLASTY N/A 08/15/2021   Procedure: CORONARY BALLOON ANGIOPLASTY;  Surgeon: Burnell Blanks, MD;  Location: [facility];  Service: Cardiovascular;  Laterality: N/A;   CORONARY STENT INTERVENTION N/A 10/18/2019   Procedure: CORONARY STENT INTERVENTION;  Surgeon: [name], MD;  Location: [facility];  Service: Cardiovascular;  Laterality: N/A;   CORONARY/GRAFT ANGIOGRAPHY N/A 10/18/2019   Procedure: CORONARY/GRAFT ANGIOGRAPHY;  Surgeon: [name], MD;  Location: [facility];  Service: Cardiovascular;  Laterality: N/A;   EP IMPLANTABLE DEVICE N/A 09/13/2014   Procedure: PPM Generator Changeout;  Surgeon: Deboraha Sprang, MD;  Location: [facility];  Service: Cardiovascular;  Laterality: N/A;   LEFT HEART CATH AND CORS/GRAFTS ANGIOGRAPHY N/A 04/14/2017   Procedure: LEFT HEART CATH AND CORS/GRAFTS ANGIOGRAPHY;  Surgeon: [name], MD;  Location: [facility];  Service: Cardiovascular;  Laterality: N/A;   LEFT HEART CATH AND CORS/GRAFTS ANGIOGRAPHY N/A 08/15/2021   Procedure: LEFT HEART CATH AND CORS/GRAFTS ANGIOGRAPHY;  Surgeon: Burnell Blanks, MD;  Location: [facility];  Service: Cardiovascular;  Laterality: N/A;   MOHS SURGERY     "just outside my nose"   PACEMAKER INSERTION  2007   TONSILLECTOMY  1958    Current Medications: Current Meds  Medication Sig   acetaminophen (TYLENOL) 500 MG tablet Take 1,000 mg by mouth every 6  (six) hours as needed for moderate pain.   allopurinol (ZYLOPRIM) 100 MG tablet Take 100 mg by mouth at bedtime.   amLODipine (NORVASC) 2.5 MG tablet Take 1 tablet (2.5 mg total) by mouth daily.   apixaban (ELIQUIS) 5 MG TABS tablet Take 1 tablet (5 mg total) by mouth 2 (two) times daily.   azelastine (ASTELIN) 0.1 % nasal spray Place 1 spray into both nostrils 2 (two) times daily. Use in each nostril as directed   clopidogrel (PLAVIX) 75 MG tablet Take 75 mg by mouth at bedtime.   empagliflozin (JARDIANCE) 10 MG TABS tablet TAKE 1 TABLET BY MOUTH DAILY BEFORE BREAKFAST.   furosemide (LASIX) 80 MG tablet Take 1 tablet (80 mg total) by mouth daily.   isosorbide mononitrate (IMDUR) 120 MG 24 hr tablet TAKE 1 TABLET (120 MG TOTAL) BY MOUTH DAILY.   ketoconazole (NIZORAL) 2 % cream Apply 1 application. topically daily as needed (itching toes).   metoprolol succinate (TOPROL-XL) 50 MG 24 hr tablet TAKE 1 TABLET  TWO TIMES DAILY. TAKE WITH OR IMMEDIATELY FOLLOWING MEALS.   predniSONE (DELTASONE) 10 MG tablet Take 1 tablet (10 mg total) by mouth daily with breakfast. (Patient taking differently: Take 5 mg by mouth daily with breakfast.)   predniSONE (DELTASONE) 20 MG tablet Take 20 mg by mouth daily with breakfast.   REPATHA SURECLICK 573 MG/ML SOAJ Inject 140 mg into the skin every 14 (fourteen) days.   tamsulosin (FLOMAX) 0.4 MG CAPS Take 0.4 mg by mouth daily.     Allergies:   Amiodarone   Social History   Socioeconomic History   Marital status: Married    Spouse name: Not on file   Number of children: Not on file   Years of education: Not on file   Highest education level: Not on file  Occupational History   Not on file  Tobacco Use   Smoking status: Never    Passive exposure: Yes   Smokeless tobacco: Never   Tobacco comments:    Never smoke 10/21/21  Vaping Use   Vaping Use: Never used  Substance and Sexual Activity   Alcohol use: Yes    Alcohol/week: 3.0 standard drinks of  alcohol    Types: 1 Glasses of wine, 1 Cans of beer, 1 Standard drinks or equivalent per week    Comment: 2-3 drinks per week 10/21/21   Drug use: No   Sexual activity: Not Currently  Other Topics Concern   Not on file  Social History Narrative   Not on file   Social Determinants of Health   Financial Resource Strain: Not on file  Food Insecurity: Not on file  Transportation Needs: Not on file  Physical Activity: Insufficiently Active (06/25/2017)   Exercise Vital Sign    Days of Exercise per Week: 4 days    Minutes of Exercise per Session: 30 min  Stress: No Stress Concern Present (06/25/2017)   [place]    Feeling of Stress : Not at all  Social Connections: Not on file     Family History: [name] family history includes Cancer in his mother; Heart attack in his father; Heart disease in his father.  ROS:   Please see the history of present illness.    All other systems reviewed and are negative.  EKGs/Labs/Other Studies Reviewed:    The following studies were reviewed today: Patient's most recent cardiac catheterization study from April 2023 is personally reviewed when he underwent scoring balloon angioplasty of the stented segments in the saphenous vein graft to PDA.  EKG:  EKG is ordered today.  The ekg ordered today demonstrates atrial flutter, ventricular pacing 84 bpm  Recent Labs: 03/05/2022: B Natriuretic Peptide 299.0 04/07/2022: ALT 47; BUN 41; Creatinine, Ser 2.47; Hemoglobin 13.8; Platelets 169; Potassium 4.2; Sodium 143; TSH 4.800  Recent Lipid Panel    Component Value Date/Time   CHOL 96 (L) 04/07/2022 0915   TRIG 145 04/07/2022 0915   HDL 57 04/07/2022 0915   CHOLHDL 1.7 04/07/2022 0915   CHOLHDL 2.4 10/19/2019 0306   VLDL 55 (H) 10/19/2019 0306   LDLCALC 15 04/07/2022 0915   LDLDIRECT 47 10/28/2019 0820     Risk Assessment/Calculations:    CHA2DS2-VASc Score = 5   This  indicates a 7.2% annual risk of stroke. [name] score is based upon: CHF History: 1 HTN History: 1 Diabetes History: 0 Stroke History: 0 Vascular Disease History: 1 Age Score: 2 Gender Score: 0  Physical Exam:    VS:  BP 132/84 (BP Location: Right Arm, Patient Position: Sitting, Cuff Size: Normal)   Pulse 85   Ht '5\' 8"'$  (1.727 m)   Wt 174 lb 6.4 oz (79.1 kg)   SpO2 92%   BMI 26.52 kg/m     Wt Readings from Last 3 Encounters:  05/12/22 174 lb 6.4 oz (79.1 kg)  04/23/22 177 lb (80.3 kg)  04/10/22 177 lb (80.3 kg)     GEN:  Well nourished, well developed in no acute distress HEENT: Normal NECK: No JVD; No carotid bruits LYMPHATICS: No lymphadenopathy CARDIAC: RRR, 2/6 systolic ejection murmur at the right upper sternal border RESPIRATORY:  Clear to auscultation without rales, wheezing or rhonchi  ABDOMEN: Soft, non-tender, non-distended MUSCULOSKELETAL: Trace bilateral pretibial edema; No deformity  SKIN: Warm and dry NEUROLOGIC:  Alert and oriented x 3 PSYCHIATRIC:  Normal affect   ASSESSMENT:    1. Coronary artery disease involving native coronary artery of native heart with angina pectoris ([place])   2. Chronic combined systolic and diastolic CHF (congestive heart failure) ([place])   3. Hyperlipidemia LDL goal <70   4. CKD (chronic kidney disease) stage 4, GFR 15-29 ml/min (HCC)   5. Bilateral carotid artery stenosis   6. Typical atrial flutter (HCC)    PLAN:    In order of problems listed above:  The patient is experiencing CCS functional class III symptoms of worsening angina requiring frequent use of nitroglycerin despite aggressive medical treatment with his antianginal program including amlodipine, metoprolol succinate, and high-dose isosorbide.  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  I looked back over time and he has required PCI about every other year.   Unfortunately, he is having severe recurrent symptoms now 9 months out from his last procedure.  We discussed potential treatment options including escalation of medical therapy versus treatment with cath and PCI.  Shared decision making conversation occurs in the context of his chronic medical problems including CKD stage IV.  We agree that repeat cardiac catheterization is indicated as he likely has severe in-stent restenosis again. I have reviewed the risks, indications, and alternatives to cardiac catheterization, possible angioplasty, and stenting with the patient. Risks include but are not limited to bleeding, infection, vascular injury, stroke, myocardial infection, arrhythmia, kidney injury, radiation-related injury in the case of prolonged fluoroscopy use, emergency cardiac surgery, and death. The patient understands the risks of serious complication is 1-2 in 0923 with diagnostic cardiac cath and 1-2% or less with angioplasty/stenting.  The patient will hold apixaban before the procedure per protocol.  He will continue on clopidogrel.  He has been done from femoral access. Clinically stable, continue current medical therapy.  Tolerating Jardiance well. Treated with Repatha Last LDL cholesterol was 15 Has not been a candidate for ACE/ARB/Entresto due to his degree of kidney disease.  Will minimize contrast at the time of his upcoming procedure. Recent carotid ultrasound is personally reviewed and shows patent right carotid endarterectomy site and less than 40% left carotid stenosis.  Continue medical management. 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 Bruhl and he is willing to proceed.    Medication Adjustments/Labs and Tests Ordered: Current medicines are reviewed at length with the patient today.  Concerns regarding medicines are outlined above.  Orders Placed This Encounter  Procedures   CBC   Basic metabolic panel   EKG 01-VCBS   No orders of the defined types were placed in this encounter.   Patient Instructions  Medication Instructions:  Your physician recommends that you continue on your current medications as directed. Please refer to the Current Medication list given to you today.  *If you need a refill on your cardiac medications before your next appointment, please call your pharmacy*   Lab Work: CBC, BMET 05/16/22 If you have labs (blood work) drawn today and your tests are completely normal, you will receive your results only by: [place] (if you have MyChart) OR A paper copy in the mail If you have any lab test that is abnormal or we need to change your treatment, we will call you to review the results.  Testing/Procedures: L Heart Catheterization Your physician has requested that you have a cardiac catheterization. Cardiac catheterization is used to diagnose and/or treat various heart conditions. Doctors may recommend this procedure for a number of different reasons. The most common reason is to evaluate chest pain. Chest pain can be a symptom of coronary artery disease (CAD), and cardiac catheterization can show whether plaque is narrowing or blocking your heart's arteries. This procedure is also used to evaluate the valves, as well as measure the blood flow and oxygen levels in different parts of your heart. For further information please visit [email or web address]. Please follow instruction sheet, as given.  Follow-Up: At [facility], you and your health needs are our priority.  As part of our continuing  mission to provide you with exceptional heart care, we have created designated Provider Care Teams.  These Care Teams include your primary Cardiologist (physician) and Advanced Practice Providers (APPs -  Physician Assistants and Nurse Practitioners) who all work together to provide you with the care you need, when you need it.  Your next appointment:   7 week(s)  Provider:   Richardson Dopp, PA      Other Instructions       Cardiac/Peripheral Catheterization   You are scheduled for a Cardiac Catheterization on Wednesday, January 24 with Dr. [name].  1. Please arrive at the Main Entrance A at [facility]: [place], [place] [address] on January 24 at 10:00 AM (This time is two hours before your procedure to ensure your preparation). Free valet parking service is available. You will check in at ADMITTING. The support person will be asked to wait in the waiting room.  It is OK to have someone drop you off and come back when you are ready to be discharged.        Special note: Every effort is made to have your procedure done on time. Please understand that emergencies sometimes delay scheduled procedures.   . 2. Diet: Do not eat solid foods after midnight.  You may have clear liquids until 5 AM the day of the procedure.  3. Labs: 05/16/22  4. Medication instructions in preparation for your procedure:   Contrast Allergy: No DO NOT TAKE Furosemide day of procedure DO NOT TAKE Tamsulosin day of procedure DO NOT TAKE Jardiance day of procedure DO NOT TAKE Eliquis for 2 days prior to procedure  On  the morning of your procedure, take Aspirin 81 mg and Clopidogrel '75mg'$  any morning medicines NOT listed above.  You may use sips of water.  5. Plan to go home the same day, you will only stay overnight if medically necessary. 6. You MUST have a responsible adult to drive you home. 7. An adult MUST be with you the first 24 hours after you arrive home. 8. Bring a  current list of your medications, and the last time and date medication taken. 9. Bring ID and current insurance cards. 10.Please wear clothes that are easy to get on and off and wear slip-on shoes.  Thank you for allowing [place] to care for you!   -- [facility] Invasive Cardiovascular services    Signed, [name], MD  05/12/2022 12:57 PM    [place]

## 2022-05-14 ENCOUNTER — Ambulatory Visit: Payer: Medicare HMO | Admitting: Pulmonary Disease

## 2022-05-14 ENCOUNTER — Encounter: Payer: Self-pay | Admitting: Pulmonary Disease

## 2022-05-14 VITALS — BP 132/68 | HR 83 | Wt 175.6 lb

## 2022-05-14 DIAGNOSIS — J324 Chronic pansinusitis: Secondary | ICD-10-CM

## 2022-05-14 DIAGNOSIS — T462X1D Poisoning by other antidysrhythmic drugs, accidental (unintentional), subsequent encounter: Secondary | ICD-10-CM | POA: Diagnosis not present

## 2022-05-14 NOTE — Progress Notes (Signed)
Patient ID: Kyle Avila, male    DOB: [birth year], 77 y.o.   MRN: [id number]  Chief Complaint  Patient presents with   Follow-up    Pt here for follow up for DOE and cough. Pt states he is down to '5mg'$  of prednisone now. Pt wants to be done with prednisone after this course. Pt started his antihistamine as you suggested.     Referring provider: [name], MD  HPI:   Mr. Kyle Avila is a 77 y.o. 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 Most recent cardiology note x3 reviewed.  EP procedure note reviewed.  Overall, doing well.  No issues with hypoxemia shortly after starting prednisone therapy.  He continues taper. Now 5 mg daily to end of month, January 2024.  Dealing with what sounds like ongoing anginal symptoms.  Worse over the last couple of months.  Has schedule left heart cath this week.  Early 2023 had POBA.  Nitroglycerin relieves chest discomfort.  Cough and nasal congestion worsened recently.  In the setting of prednisone tapering.  Historically prednisone is greatly improved cough.  Recently resumed azelastine spray.  This has helped.  Productive of a lot of mucus.  He again relates this has been essentially the pattern over the last 5 years with cough.  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  This prompted cardiology to contact me with concern for amiodarone toxicity.  I retreated PFTs that showed stable capacities and volumes with newly reduced DLCO with subsequent high-res CT scan showed diffuse nodular GGO's concerning for pneumonitis.  Amiodarone was stopped and he was placed on prednisone 40 mg daily about 1 week ago.  In the interim.  Mild  improvement in his dyspnea.  Still quite dyspneic compared to baseline.  However he has noticed his O2 saturations are improving at home.  He was walked in the office today and did not desaturate.    He notes that Dorothea Ogle last visit we did a prolonged prednisone taper given his chronic cough that responds well to this.  He can like as he was tapering down and getting the lower dose of the prednisone dose when his symptoms first began.  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  Further competent in this diagnosis given his subsequent improvement with initial steroid therapy.  Discussed at length the plan timeframe for prednisone de-escalation of the next 3-6 months.  Need for prophylaxis versus PJP pneumonia.  He had a list about 4 pages long, a dozen or so questions they were all answered to the best of my ability.  HPI at initial visit: Cough present for ~4 years. Cough is severe. First noticed in fall and spring but now present year round. No clear exacerbating factors. Not worse in morning or at night or when supine. Constant throughout the day. Not worse when eating. Occasionally worse when eating dry crackers. Cough improves for weeks at a time after prednisone, usually 10 mg daily x 7 days. Most recent course was ~3 weeks ago.  Trialed flonase for post nasal drip and does not think it improved. Denies heartburn or regurgitation of food. Trialed once a  day PPI for "months" without improved cough. Notes when first started his sputum was foul smelling after sneezing. This resolved months to years ago. Endorses occasional sinus pressure that comes and goes. Unsure if cough is worse with pressure present or if prednisone improves sinus pressure. Denies post nasal drip or watery eyes. Does not think cough varies with seasons. No h/o asthma as child or younger adult. Denies DOE but notes significant issues with angina.   Had CXR per PCP for this cough  but notes its been at least a couple of years. I can not acces this image. CXR reviewed 12/2005 with new R sided pacemaker placement, lungs clear, no evidence of hyperinflation on my interpretation.   PMH:  Smoker/ Smoking History: never smoker  Social History: Never smoker. Married. Retired from [employer].  Surgical history: CABG x 2, multiple PCIs  Family history: Father with heart attack in 62s, mother with cancer  PFT: 11/2019 reviewed and interpreted as normal spirometry, no bronchodilator response, lung volumes within normal limits, corrected DLCO within normal limits-normal PFTs  Lab Results: Personally reviewed, abs eos 300  CBC    Component Value Date/Time   WBC 6.3 04/07/2022 0915   WBC 3.7 (L) 09/26/2021 0916   RBC 3.74 (L) 04/07/2022 0915   RBC 3.60 (L) 09/26/2021 0916   HGB 13.8 04/07/2022 0915   HCT 40.7 04/07/2022 0915   PLT 169 04/07/2022 0915   MCV 109 (H) 04/07/2022 0915   MCH 36.9 (H) 04/07/2022 0915   MCH 35.0 (H) 09/26/2021 0916   MCHC 33.9 04/07/2022 0915   MCHC 32.1 09/26/2021 0916   RDW 15.0 04/07/2022 0915   LYMPHSABS 0.7 05/02/2021 1537   LYMPHSABS 1.1 10/14/2019 0900   MONOABS 0.2 05/02/2021 1537   EOSABS 0.0 05/02/2021 1537   EOSABS 0.3 10/14/2019 0900   BASOSABS 0.0 05/02/2021 1537   BASOSABS 0.0 10/14/2019 0900    BMET    Component Value Date/Time   NA 143 04/07/2022 0915   K 4.2 04/07/2022 0915   CL 105 04/07/2022 0915   CO2 26 04/07/2022 0915   GLUCOSE 92 04/07/2022 0915   GLUCOSE 233 (H) 03/05/2022 1234   BUN 41 (H) 04/07/2022 0915   CREATININE 2.47 (H) 04/07/2022 0915   CALCIUM 9.1 04/07/2022 0915   GFRNONAA 24 (L) 03/05/2022 1234   GFRAA 26 (L) 11/15/2019 1548    BNP    Component Value Date/Time   BNP 299.0 (H) 03/05/2022 1230    ProBNP No results found for: "PROBNP"   Imaging: Personally reviewed and as interpretation in HPI above.  Specialty Problems       Pulmonary Problems   Chronic pansinusitis   Chronic  cough    Allergies  Allergen Reactions   Amiodarone Other (See Comments)    Lung toxicity     Immunization History  Administered Date(s) Administered   COVID-19, mRNA, vaccine(Comirnaty)12 years and older 03/11/2022   DTaP, 5 pertussis antigens 08/29/2016   Fluad Quad(high Dose 65+) 01/27/2022   Influenza Split 01/27/2011, 01/27/2012, 01/10/2013, 07/27/2013   Influenza, High Dose Seasonal PF 01/31/2019   Influenza, Quadrivalent, Recombinant, Inj, Pf 02/04/2019, 02/04/2020   Influenza-Unspecified 01/10/2021   PFIZER(Purple Top)SARS-COV-2 Vaccination 05/11/2019, 06/02/2019, 06/27/2019, 02/27/2020, 08/05/2020   Pneumococcal Conjugate-13 08/09/2014   Pneumococcal Polysaccharide-23 02/27/2007   Zoster Recombinat (Shingrix) 01/27/2022   Zoster, Live 07/27/2013    Past Medical History:  Diagnosis Date   Arthritis    "some in my back" (04/14/2017)   CAD (coronary artery disease)  a.  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; lupus attacked them" (04/14/2017)   Mitral insufficiency    Moderate mitral regurgitation 10/02/2021   Echocardiogram 6/23: EF 80, global HK, mild ASH, mildly reduced RVSF, normal PASP, RVSP 31, mod LAD, mild RAE, mod MR, trivial AI, AV sclerosis w/o stenosis   Myocardial infarction ([place])    "I've had some mild one" (04/14/2017)   Presence of permanent cardiac pacemaker    Skin cancer    "nose"   VT (ventricular tachycardia)-nonsustained 05/20/2011    Tobacco History: Social History   Tobacco Use  Smoking Status Never   Passive exposure: Yes  Smokeless Tobacco Never  Tobacco Comments   Never smoke 10/21/21   Counseling given: Not Answered Tobacco comments: Never smoke 10/21/21   Continue to not  smoke  Outpatient Encounter Medications as of 05/14/2022  Medication Sig   acetaminophen (TYLENOL) 500 MG tablet Take 1,000 mg by mouth every 6 (six) hours as needed for moderate pain.   allopurinol (ZYLOPRIM) 100 MG tablet Take 100 mg by mouth at bedtime.   amLODipine (NORVASC) 2.5 MG tablet Take 1 tablet (2.5 mg total) by mouth daily.   apixaban (ELIQUIS) 5 MG TABS tablet Take 1 tablet (5 mg total) by mouth 2 (two) times daily.   azelastine (ASTELIN) 0.1 % nasal spray Place 1 spray into both nostrils 2 (two) times daily. Use in each nostril as directed   clopidogrel (PLAVIX) 75 MG tablet Take 75 mg by mouth at bedtime.   empagliflozin (JARDIANCE) 10 MG TABS tablet TAKE 1 TABLET BY MOUTH DAILY BEFORE BREAKFAST.   furosemide (LASIX) 80 MG tablet Take 1 tablet (80 mg total) by mouth daily.   isosorbide mononitrate (IMDUR) 120 MG 24 hr tablet TAKE 1 TABLET (120 MG TOTAL) BY MOUTH DAILY.   ketoconazole (NIZORAL) 2 % cream Apply 1 application. topically daily as needed (itching toes).   metoprolol succinate (TOPROL-XL) 50 MG 24 hr tablet TAKE 1 TABLET TWO TIMES DAILY. TAKE WITH OR IMMEDIATELY FOLLOWING MEALS.   predniSONE (DELTASONE) 10 MG tablet Take 1 tablet (10 mg total) by mouth daily with breakfast. (Patient taking differently: Take 5 mg by mouth daily with breakfast.)   REPATHA SURECLICK 621 MG/ML SOAJ Inject 140 mg into the skin every 14 (fourteen) days.   tamsulosin (FLOMAX) 0.4 MG CAPS Take 0.4 mg by mouth daily.   [DISCONTINUED] predniSONE (DELTASONE) 20 MG tablet Take 20 mg by mouth daily with breakfast.   nitroGLYCERIN (NITROSTAT) 0.4 MG SL tablet PLACE 1 TABLET UNDER THE TONGUE EVERY 5 MINUTES AS NEEDED FOR CHEST PAIN   No facility-administered encounter medications on file as of 05/14/2022.     Review of Systems  Review of Systems  N/a  Physical Exam  BP 132/68 (BP Location: Left Arm, Patient Position: Sitting, Cuff Size: Normal)   Pulse 83   Wt 175 lb 9.6 oz (79.7 kg)    SpO2 98%   BMI 26.70 kg/m   Wt Readings from Last 5 Encounters:  05/14/22 175 lb 9.6 oz (79.7 kg)  05/12/22 174 lb 6.4 oz (79.1 kg)  04/23/22 177 lb (80.3 kg)  04/10/22 177 lb (80.3 kg)  03/13/22 172 lb 9.6 oz (78.3 kg)    BMI Readings from Last 5 Encounters:  05/14/22 26.70 kg/m  05/12/22 26.52 kg/m  04/23/22 26.91 kg/m  04/10/22 26.91 kg/m  03/13/22 26.24 kg/m     Physical Exam General: well appearing, no acute distress Eyes: EOMI, no icterus Respiratory: NWOB, lungs CTAB, no wheeze Cardiovascular: RRR, no murmurs, no gallops Skin: Warm, no rash, no lesions Neurologic: normal gait, CN intact Psych: Normal mood, full affect   Assessment & Plan:   Mr. Marone is a 76 y.o. 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  Chronic cough: Nagging for many years.  Productive.  Resolved intermittently with prednisone.  Not responded well to asthma therapy including triple inhaled therapy and Nucala (eosinophilia in the past).  Suspect this is from sinuses with CT sinuses 12/2021 revealing signs of chronic sinusitis.  Mild improvement with azelastine, antihistamine spray.  Continue as needed.  ENT referral in the future if cough worsens.  He wishes to avoid prednisone in the future given prolonged exposure recently.  Amiodarone lung toxicity: Based on new hypoxemia and dyspnea as well as imaging findings and reduced DLCO.  No desaturation in clinic today with walking or at rest, no role for oxygen at this time.  Improving with prednisone therapy.  Now on 5 mg daily.  Will repeat CT  scan in the coming weeks to establish new baseline, assess for possible scarring, fibrosis etc.  No follow-ups on file.   Lanier Clam, MD 05/14/2022   This appointment required 41 minutes of patient care (this includes precharting, chart review, review of results, face-to-face care, etc.).

## 2022-05-15 ENCOUNTER — Encounter: Payer: Medicare HMO | Admitting: *Deleted

## 2022-05-16 ENCOUNTER — Ambulatory Visit: Payer: Medicare HMO | Attending: Cardiovascular Disease

## 2022-05-16 DIAGNOSIS — N184 Chronic kidney disease, stage 4 (severe): Secondary | ICD-10-CM

## 2022-05-16 DIAGNOSIS — I6523 Occlusion and stenosis of bilateral carotid arteries: Secondary | ICD-10-CM | POA: Diagnosis not present

## 2022-05-16 DIAGNOSIS — I5042 Chronic combined systolic (congestive) and diastolic (congestive) heart failure: Secondary | ICD-10-CM

## 2022-05-16 DIAGNOSIS — I25119 Atherosclerotic heart disease of native coronary artery with unspecified angina pectoris: Secondary | ICD-10-CM

## 2022-05-16 DIAGNOSIS — E785 Hyperlipidemia, unspecified: Secondary | ICD-10-CM

## 2022-05-16 LAB — CBC

## 2022-05-17 LAB — CBC
Hematocrit: 44.3 % (ref 37.5–51.0)
Hemoglobin: 14.7 g/dL (ref 13.0–17.7)
MCH: 34.5 pg — ABNORMAL HIGH (ref 26.6–33.0)
MCHC: 33.2 g/dL (ref 31.5–35.7)
MCV: 104 fL — ABNORMAL HIGH (ref 79–97)
Platelets: 190 10*3/uL (ref 150–450)
RBC: 4.26 x10E6/uL (ref 4.14–5.80)
RDW: 14.2 % (ref 11.6–15.4)
WBC: 5.5 10*3/uL (ref 3.4–10.8)

## 2022-05-17 LAB — BASIC METABOLIC PANEL
BUN/Creatinine Ratio: 16 (ref 10–24)
BUN: 33 mg/dL — ABNORMAL HIGH (ref 8–27)
CO2: 27 mmol/L (ref 20–29)
Calcium: 9.5 mg/dL (ref 8.6–10.2)
Chloride: 99 mmol/L (ref 96–106)
Creatinine, Ser: 2.1 mg/dL — ABNORMAL HIGH (ref 0.76–1.27)
Glucose: 126 mg/dL — ABNORMAL HIGH (ref 70–99)
Potassium: 4 mmol/L (ref 3.5–5.2)
Sodium: 141 mmol/L (ref 134–144)
eGFR: 32 mL/min/{1.73_m2} — ABNORMAL LOW (ref >59)

## 2022-05-19 ENCOUNTER — Encounter: Payer: Self-pay | Admitting: Cardiovascular Disease

## 2022-05-20 ENCOUNTER — Telehealth: Payer: Self-pay | Admitting: *Deleted

## 2022-05-20 NOTE — Telephone Encounter (Signed)
Cardiac Catheterization scheduled at [facility] for: Wednesday May 21, 2022 2:30 PM Arrival time and place: [place] Entrance A at: 10 AM-pre-procedure hydration  Nothing to eat after midnight prior to procedure, clear liquids until 5 AM day of procedure.  Medication instructions: -Hold:  Eliquis-none 05/19/22 until post procedure.  Jardiance-AM of procedure  Lasix-day before and day of procedure-per protocol GFR 32-pt already taken today. -Except hold medications usual morning medications can be taken with sips of water including aspirin 81 mg and Plavix 75 mg.   Confirmed patient has responsible adult to drive home post procedure and be with patient first 24 hours after arriving home.  Patient reports no new symptoms concerning for COVID-19 in the past 10 days.  Reviewed procedure instructions with patient.

## 2022-05-21 ENCOUNTER — Observation Stay (HOSPITAL_COMMUNITY)
Admission: RE | Admit: 2022-05-21 | Discharge: 2022-05-22 | Disposition: A | Discharge status: Home / Self Care | Payer: Medicare HMO | Attending: Cardiovascular Disease | Admitting: Cardiovascular Disease

## 2022-05-21 ENCOUNTER — Other Ambulatory Visit: Payer: Self-pay

## 2022-05-21 ENCOUNTER — Encounter (HOSPITAL_COMMUNITY)
Admission: RE | Disposition: A | Discharge status: Home / Self Care | Payer: Self-pay | Source: Home / Self Care | Attending: Cardiovascular Disease

## 2022-05-21 ENCOUNTER — Encounter: Payer: Self-pay | Admitting: *Deleted

## 2022-05-21 DIAGNOSIS — E782 Mixed hyperlipidemia: Secondary | ICD-10-CM

## 2022-05-21 DIAGNOSIS — Z85828 Personal history of other malignant neoplasm of skin: Secondary | ICD-10-CM | POA: Insufficient documentation

## 2022-05-21 DIAGNOSIS — I4892 Unspecified atrial flutter: Secondary | ICD-10-CM | POA: Insufficient documentation

## 2022-05-21 DIAGNOSIS — Z955 Presence of coronary angioplasty implant and graft: Secondary | ICD-10-CM | POA: Diagnosis not present

## 2022-05-21 DIAGNOSIS — Z950 Presence of cardiac pacemaker: Secondary | ICD-10-CM | POA: Diagnosis not present

## 2022-05-21 DIAGNOSIS — I25718 Atherosclerosis of autologous vein coronary artery bypass graft(s) with other forms of angina pectoris: Secondary | ICD-10-CM | POA: Diagnosis not present

## 2022-05-21 DIAGNOSIS — Z951 Presence of aortocoronary bypass graft: Secondary | ICD-10-CM | POA: Insufficient documentation

## 2022-05-21 DIAGNOSIS — N184 Chronic kidney disease, stage 4 (severe): Secondary | ICD-10-CM | POA: Insufficient documentation

## 2022-05-21 DIAGNOSIS — Z7902 Long term (current) use of antithrombotics/antiplatelets: Secondary | ICD-10-CM | POA: Diagnosis not present

## 2022-05-21 DIAGNOSIS — Z9861 Coronary angioplasty status: Secondary | ICD-10-CM

## 2022-05-21 DIAGNOSIS — I25119 Atherosclerotic heart disease of native coronary artery with unspecified angina pectoris: Secondary | ICD-10-CM | POA: Diagnosis not present

## 2022-05-21 DIAGNOSIS — Z79899 Other long term (current) drug therapy: Secondary | ICD-10-CM | POA: Diagnosis not present

## 2022-05-21 DIAGNOSIS — I5040 Unspecified combined systolic (congestive) and diastolic (congestive) heart failure: Secondary | ICD-10-CM | POA: Insufficient documentation

## 2022-05-21 DIAGNOSIS — J984 Other disorders of lung: Secondary | ICD-10-CM

## 2022-05-21 DIAGNOSIS — I4819 Other persistent atrial fibrillation: Secondary | ICD-10-CM | POA: Insufficient documentation

## 2022-05-21 DIAGNOSIS — I25118 Atherosclerotic heart disease of native coronary artery with other forms of angina pectoris: Principal | ICD-10-CM | POA: Diagnosis present

## 2022-05-21 DIAGNOSIS — Z7901 Long term (current) use of anticoagulants: Secondary | ICD-10-CM | POA: Diagnosis not present

## 2022-05-21 DIAGNOSIS — I2510 Atherosclerotic heart disease of native coronary artery without angina pectoris: Secondary | ICD-10-CM | POA: Diagnosis present

## 2022-05-21 DIAGNOSIS — I2129 ST elevation (STEMI) myocardial infarction involving other sites: Secondary | ICD-10-CM | POA: Diagnosis present

## 2022-05-21 DIAGNOSIS — I5022 Chronic systolic (congestive) heart failure: Secondary | ICD-10-CM

## 2022-05-21 DIAGNOSIS — I10 Essential (primary) hypertension: Secondary | ICD-10-CM | POA: Diagnosis present

## 2022-05-21 DIAGNOSIS — E785 Hyperlipidemia, unspecified: Secondary | ICD-10-CM | POA: Diagnosis present

## 2022-05-21 DIAGNOSIS — I252 Old myocardial infarction: Secondary | ICD-10-CM | POA: Diagnosis present

## 2022-05-21 DIAGNOSIS — I5020 Unspecified systolic (congestive) heart failure: Secondary | ICD-10-CM | POA: Diagnosis present

## 2022-05-21 DIAGNOSIS — I130 Hypertensive heart and chronic kidney disease with heart failure and stage 1 through stage 4 chronic kidney disease, or unspecified chronic kidney disease: Secondary | ICD-10-CM | POA: Diagnosis not present

## 2022-05-21 HISTORY — PX: LEFT HEART CATH AND CORS/GRAFTS ANGIOGRAPHY: CATH118250

## 2022-05-21 SURGERY — LEFT HEART CATH AND CORS/GRAFTS ANGIOGRAPHY
Anesthesia: LOCAL

## 2022-05-21 MED ORDER — ONDANSETRON HCL 4 MG/2ML IJ SOLN: 4.0000 mg | Freq: Four times a day (QID) | INTRAMUSCULAR | Status: DC | PRN

## 2022-05-21 MED ORDER — LIDOCAINE HCL (PF) 1 % IJ SOLN
INTRAMUSCULAR | Status: DC | PRN
  Administered 2022-05-21: 2 mL

## 2022-05-21 MED ORDER — FENTANYL CITRATE (PF) 100 MCG/2ML IJ SOLN
INTRAMUSCULAR | Status: DC | PRN
  Administered 2022-05-21: 25 ug via INTRAVENOUS

## 2022-05-21 MED ORDER — SODIUM CHLORIDE 0.9 % IV SOLN: 250.0000 mL | INTRAVENOUS | Status: DC | PRN

## 2022-05-21 MED ORDER — SODIUM CHLORIDE 0.9% FLUSH: 3.0000 mL | INTRAVENOUS | Status: DC | PRN

## 2022-05-21 MED ORDER — SODIUM CHLORIDE 0.9 % WEIGHT BASED INFUSION
3.0000 mL/kg/h | INTRAVENOUS | Status: AC
  Administered 2022-05-21: 3 mL/kg/h via INTRAVENOUS

## 2022-05-21 MED ORDER — HEPARIN SODIUM (PORCINE) 1000 UNIT/ML IJ SOLN
INTRAMUSCULAR | Status: DC | PRN
  Administered 2022-05-21: 7000 [IU] via INTRAVENOUS

## 2022-05-21 MED ORDER — FENTANYL CITRATE (PF) 100 MCG/2ML IJ SOLN
INTRAMUSCULAR | Status: AC
  Filled 2022-05-21: qty 2

## 2022-05-21 MED ORDER — ACETAMINOPHEN 325 MG PO TABS: 650.0000 mg | ORAL_TABLET | ORAL | Status: DC | PRN

## 2022-05-21 MED ORDER — MIDAZOLAM HCL 2 MG/2ML IJ SOLN
INTRAMUSCULAR | Status: DC | PRN
  Administered 2022-05-21: 2 mg via INTRAVENOUS

## 2022-05-21 MED ORDER — SODIUM CHLORIDE 0.9% FLUSH
3.0000 mL | Freq: Two times a day (BID) | INTRAVENOUS | Status: DC
  Administered 2022-05-21: 3 mL via INTRAVENOUS

## 2022-05-21 MED ORDER — CLOPIDOGREL BISULFATE 75 MG PO TABS: 75.0000 mg | ORAL_TABLET | ORAL | Status: DC

## 2022-05-21 MED ORDER — RANOLAZINE ER 500 MG PO TB12
500.0000 mg | ORAL_TABLET | Freq: Two times a day (BID) | ORAL | Status: DC
  Administered 2022-05-21 – 2022-05-22 (×2): 500 mg via ORAL
  Filled 2022-05-21 (×2): qty 1

## 2022-05-21 MED ORDER — ASPIRIN 81 MG PO CHEW: 81.0000 mg | CHEWABLE_TABLET | ORAL | Status: DC

## 2022-05-21 MED ORDER — SODIUM CHLORIDE 0.9 % WEIGHT BASED INFUSION: 1.0000 mL/kg/h | INTRAVENOUS | Status: DC

## 2022-05-21 MED ORDER — HEPARIN SODIUM (PORCINE) 1000 UNIT/ML IJ SOLN
INTRAMUSCULAR | Status: AC
  Filled 2022-05-21: qty 10

## 2022-05-21 MED ORDER — HYDRALAZINE HCL 20 MG/ML IJ SOLN
INTRAMUSCULAR | Status: AC
  Filled 2022-05-21: qty 1

## 2022-05-21 MED ORDER — HYDRALAZINE HCL 20 MG/ML IJ SOLN: 10.0000 mg | INTRAMUSCULAR | Status: AC | PRN

## 2022-05-21 MED ORDER — SODIUM CHLORIDE 0.9 % IV SOLN: INTRAVENOUS | Status: AC

## 2022-05-21 MED ORDER — LIDOCAINE HCL (PF) 1 % IJ SOLN
INTRAMUSCULAR | Status: AC
  Filled 2022-05-21: qty 30

## 2022-05-21 MED ORDER — HEPARIN (PORCINE) IN NACL 1000-0.9 UT/500ML-% IV SOLN
INTRAVENOUS | Status: AC
  Filled 2022-05-21: qty 1000

## 2022-05-21 MED ORDER — VERAPAMIL HCL 2.5 MG/ML IV SOLN
INTRAVENOUS | Status: DC | PRN
  Administered 2022-05-21: 10 mL via INTRA_ARTERIAL

## 2022-05-21 MED ORDER — HEPARIN (PORCINE) IN NACL 1000-0.9 UT/500ML-% IV SOLN
INTRAVENOUS | Status: DC | PRN
  Administered 2022-05-21 (×2): 500 mL

## 2022-05-21 MED ORDER — IOHEXOL 350 MG/ML SOLN
INTRAVENOUS | Status: DC | PRN
  Administered 2022-05-21: 25 mL via INTRA_ARTERIAL

## 2022-05-21 MED ORDER — LABETALOL HCL 5 MG/ML IV SOLN: 10.0000 mg | INTRAVENOUS | Status: AC | PRN

## 2022-05-21 MED ORDER — MIDAZOLAM HCL 2 MG/2ML IJ SOLN
INTRAMUSCULAR | Status: AC
  Filled 2022-05-21: qty 2

## 2022-05-21 SURGICAL SUPPLY — 12 items
CATH INFINITI 5 FR IM (CATHETERS) IMPLANT
CATH INFINITI 5FR MULTPACK ANG (CATHETERS) IMPLANT
CATH VISTA GUIDE 6FR MPA1 (CATHETERS) IMPLANT
DEVICE RAD COMP TR BAND LRG (VASCULAR PRODUCTS) IMPLANT
ELECT DEFIB PAD ADLT CADENCE (PAD) IMPLANT
GLIDESHEATH SLEND SS 6F .021 (SHEATH) IMPLANT
GUIDEWIRE INQWIRE 1.5J.035X260 (WIRE) IMPLANT
INQWIRE 1.5J .035X260CM (WIRE) ×1
KIT HEART LEFT (KITS) ×1 IMPLANT
PACK CARDIAC CATHETERIZATION (CUSTOM PROCEDURE TRAY) ×1 IMPLANT
TRANSDUCER W/STOPCOCK (MISCELLANEOUS) ×1 IMPLANT
TUBING CIL FLEX 10 FLL-RA (TUBING) ×1 IMPLANT

## 2022-05-21 NOTE — Interval H&P Note (Signed)
History and Physical Interval Note:  05/21/2022 5:19 PM  Kyle Avila  has presented today for surgery, with the diagnosis of Unstable Angina.  The various methods of treatment have been discussed with the patient and family. After consideration of risks, benefits and other options for treatment, the patient has consented to  Procedure(s): LEFT HEART CATH AND CORS/GRAFTS ANGIOGRAPHY (N/A) as a surgical intervention.  [name] history has been reviewed, patient examined, no change in status, stable for surgery.  I have reviewed the patient's chart and labs.  Questions were answered to the patient's satisfaction.     [name]

## 2022-05-21 NOTE — Progress Notes (Signed)
Cardiac Individual Treatment Plan  Patient Details  Name: Kyle Avila MRN: [id number] Date of Birth: Aug 10, 1945 Referring Provider:   Flowsheet Row Cardiac Rehab from 02/05/2022 in [facility] Cardiac and Pulmonary Rehab  Referring Provider [name] MD       Initial Encounter Date:  Flowsheet Row Cardiac Rehab from 02/05/2022 in [facility] Cardiac and Pulmonary Rehab  Date 02/05/22       Visit Diagnosis: S/P PTCA (percutaneous transluminal coronary angioplasty)  Patient's Home Medications on Admission: No current facility-administered medications for this visit. No current outpatient medications on file.  Facility-Administered Medications Ordered in Other Visits:    0.9 %  sodium chloride infusion, 250 mL, Intravenous, PRN, [name], MD   0.9% sodium chloride infusion, 3 mL/kg/hr, Intravenous, Continuous **FOLLOWED BY** 0.9% sodium chloride infusion, 1 mL/kg/hr, Intravenous, Continuous, [name], MD   aspirin chewable tablet 81 mg, 81 mg, Oral, Pre-Cath, [name], MD   clopidogrel (PLAVIX) tablet 75 mg, 75 mg, Oral, Pre-Cath, [name], MD   sodium chloride flush (NS) 0.9 % injection 3 mL, 3 mL, Intravenous, Q12H, [name], MD   sodium chloride flush (NS) 0.9 % injection 3 mL, 3 mL, Intravenous, PRN, [name], MD  Past Medical History: Past Medical History:  Diagnosis Date   Arthritis    "some in my back" (04/14/2017)   CAD (coronary artery disease)    a.  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[place])    s/p PTVDP   Gout    HTN (hypertension)    Hyperlipidemia    Lupus ([place])    "that's why my kidneys are gone; lupus attacked them" (04/14/2017)   Mitral insufficiency    Moderate mitral regurgitation 10/02/2021   Echocardiogram 6/23: EF 74, global HK, mild ASH, mildly reduced RVSF, normal PASP, RVSP  31, mod LAD, mild RAE, mod MR, trivial AI, AV sclerosis w/o stenosis   Myocardial infarction ([place])    "I've had some mild one" (04/14/2017)   Presence of permanent cardiac pacemaker    Skin cancer    "nose"   VT (ventricular tachycardia)-nonsustained 05/20/2011    Tobacco Use: Social History   Tobacco Use  Smoking Status Never   Passive exposure: Yes  Smokeless Tobacco Never  Tobacco Comments   Never smoke 10/21/21    Labs: Review Flowsheet  More data exists      Latest Ref Rng & Units 05/04/2015 02/12/2016 10/19/2019 10/28/2019 04/07/2022  Labs for ITP Cardiac and Pulmonary Rehab  Cholestrol 100 - 199 mg/dL 159  159  90  133  96   LDL (calc) 0 - 99 mg/dL 84  87  NEG 2  52  15   Direct LDL 0 - 99 mg/dL - - - 47  -  HDL-C >39 mg/dL 51  46  37  41  57   Trlycerides 0 - 149 mg/dL 118  130  275  254  145      Exercise Target Goals: Exercise Program Goal: Individual exercise prescription set using results from initial 6 min walk test and THRR while considering  patient's activity barriers and safety.   Exercise Prescription Goal: Initial exercise prescription builds to 30-45 minutes a day of aerobic activity, 2-3 days per week.  Home exercise guidelines will be given to patient during program as part of exercise  prescription that the participant will acknowledge.   Education: Aerobic Exercise: - Group verbal and visual presentation on the components of exercise prescription. Introduces F.I.T.T principle from ACSM for exercise prescriptions.  Reviews F.I.T.T. principles of aerobic exercise including progression. Written material given at graduation.   Education: Resistance Exercise: - Group verbal and visual presentation on the components of exercise prescription. Introduces F.I.T.T principle from ACSM for exercise prescriptions  Reviews F.I.T.T. principles of resistance exercise including progression. Written material given at graduation.    Education: Exercise & Equipment  Safety: - Individual verbal instruction and demonstration of equipment use and safety with use of the equipment. Flowsheet Row Cardiac Rehab from 04/02/2022 in [facility] Cardiac and Pulmonary Rehab  Date 01/29/22  Educator [facility]  Instruction Review Code 1- Verbalizes Understanding       Education: Exercise Physiology & General Exercise Guidelines: - Group verbal and written instruction with models to review the exercise physiology of the cardiovascular system and associated critical values. Provides general exercise guidelines with specific guidelines to those with heart or lung disease.    Education: Flexibility, Balance, Mind/Body Relaxation: - Group verbal and visual presentation with interactive activity on the components of exercise prescription. Introduces F.I.T.T principle from ACSM for exercise prescriptions. Reviews F.I.T.T. principles of flexibility and balance exercise training including progression. Also discusses the mind body connection.  Reviews various relaxation techniques to help reduce and manage stress (i.e. Deep breathing, progressive muscle relaxation, and visualization). Balance handout provided to take home. Written material given at graduation.   Activity Barriers & Risk Stratification:  Activity Barriers & Cardiac Risk Stratification - 02/05/22 1217       Activity Barriers & Cardiac Risk Stratification   Activity Barriers Other (comment);Deconditioning;Muscular Weakness;Shortness of Breath;Decreased Ventricular Function;Balance Concerns;Chest Pain/Angina    Comments quick onset of fatigue (rushing especially)    Cardiac Risk Stratification High             6 Minute Walk:  6 Minute Walk     Row Name 02/05/22 1216         6 Minute Walk   Phase Initial     Distance 1110 feet     Walk Time 6 minutes     # of Rest Breaks 0     MPH 2.1     METS 2.02     RPE 13     Perceived Dyspnea  2     VO2 Peak 7.06     Symptoms Yes (comment)     Comments SOB, chest  pain with arm pain 4/10     Resting HR 58 bpm     Resting BP 128/64     Resting Oxygen Saturation  98 %     Exercise Oxygen Saturation  during 6 min walk 98 %     Max Ex. HR 75 bpm     Max Ex. BP 128/60     2 Minute Post BP 124/60              Oxygen Initial Assessment:   Oxygen Re-Evaluation:   Oxygen Discharge (Final Oxygen Re-Evaluation):   Initial Exercise Prescription:  Initial Exercise Prescription - 02/05/22 1200       Date of Initial Exercise RX and Referring Provider   Date 02/05/22    Referring Provider [name] MD      Oxygen   Maintain Oxygen Saturation 88% or higher      Treadmill   MPH 1.8    Grade 0.5  Minutes 15    METs 2.5      Recumbant Bike   Level 1    RPM 50    Watts 10    Minutes 15    METs 2      REL-XR   Level 2    Speed 50    Minutes 15    METs 2      Track   Laps 27    Minutes 15    METs 2.47      Prescription Details   Frequency (times per week) 2    Duration Progress to 30 minutes of continuous aerobic without signs/symptoms of physical distress      Intensity   THRR 40-80% of Max Heartrate 92-126    Ratings of Perceived Exertion 11-13    Perceived Dyspnea 0-4      Progression   Progression Continue to progress workloads to maintain intensity without signs/symptoms of physical distress.      Resistance Training   Training Prescription Yes    Weight 4 lb    Reps 10-15             Perform Capillary Blood Glucose checks as needed.  Exercise Prescription Changes:   Exercise Prescription Changes     Row Name 02/05/22 1200 02/11/22 1500 02/25/22 1300 04/08/22 1500 04/23/22 1100     Response to Exercise   Blood Pressure (Admit) 128/64 102/58 110/58 126/64 118/62   Blood Pressure (Exercise) 128/60 132/70 142/62 144/70 138/66   Blood Pressure (Exit) 124/60 118/64 102/58 116/58 122/56   Heart Rate (Admit) 58 bpm 52 bpm 67 bpm 64 bpm 58 bpm   Heart Rate (Exercise) 75 bpm 76 bpm 96 bpm 74 bpm 78  bpm   Heart Rate (Exit) 66 bpm 68 bpm 66 bpm 56 bpm 55 bpm   Oxygen Saturation (Admit) 98 % -- -- -- --   Oxygen Saturation (Exercise) 98 % -- -- -- --   Rating of Perceived Exertion (Exercise) '13 13 13 13 14   '$ Perceived Dyspnea (Exercise) 2 -- -- -- --   Symptoms SOB, chest/arm pain 4/10 none none none none   Comments walk test results 1st full day of exercise -- -- --   Duration -- Progress to 30 minutes of  aerobic without signs/symptoms of physical distress Continue with 30 min of aerobic exercise without signs/symptoms of physical distress. Continue with 30 min of aerobic exercise without signs/symptoms of physical distress. Continue with 30 min of aerobic exercise without signs/symptoms of physical distress.   Intensity -- THRR unchanged THRR unchanged THRR unchanged THRR unchanged     Progression   Progression -- Continue to progress workloads to maintain intensity without signs/symptoms of physical distress. Continue to progress workloads to maintain intensity without signs/symptoms of physical distress. Continue to progress workloads to maintain intensity without signs/symptoms of physical distress. Continue to progress workloads to maintain intensity without signs/symptoms of physical distress.   Average METs -- 2.9 3.1 2.83 3.01     Resistance Training   Training Prescription -- Yes Yes Yes Yes   Weight -- 4 lb 4 lb 4 lb 4 lb   Reps -- 10-15 10-15 10-15 10-15     Interval Training   Interval Training -- No No No No     Treadmill   MPH -- 2 2.3 2.1 --   Grade -- 0.5 0.5 2 --   Minutes -- '15 15 15 '$ --   METs -- 2.64 2.92 3.19 --  Recumbant Bike   Level -- -- 3 4 --   Watts -- -- 23 23 --   Minutes -- -- 15 15 --   METs -- -- 2.91 2.9 --     REL-XR   Level -- '4 3 5 4   '$ Minutes -- '15 15 15 15   '$ METs -- -- 4.5 4.2 4     T5 Nustep   Level -- -- -- -- 3   Minutes -- -- -- -- 15   METs -- -- -- -- 2.4     Track   Laps -- -- -- 27 30   Minutes -- -- -- 15 15   METs  -- -- -- 2.47 2.63     Oxygen   Maintain Oxygen Saturation -- -- 88% or higher 88% or higher 88% or higher            Exercise Comments:   Exercise Comments     Row Name 02/06/22 0907 02/18/22 0829         Exercise Comments First full day of exercise!  Patient was oriented to gym and equipment including functions, settings, policies, and procedures.  Patient's individual exercise prescription and treatment plan were reviewed.  All starting workloads were established based on the results of the 6 minute walk test done at initial orientation visit.  The plan for exercise progression was also introduced and progression will be customized based on patient's performance and goals. Fell in yard while doing yardwork, few scratches,no other injury               Exercise Goals and Review:   Exercise Goals     Row Name 02/05/22 1220             Exercise Goals   Increase Physical Activity Yes       Intervention Provide advice, education, support and counseling about physical activity/exercise needs.;Develop an individualized exercise prescription for aerobic and resistive training based on initial evaluation findings, risk stratification, comorbidities and participant's personal goals.       Expected Outcomes Short Term: Attend rehab on a regular basis to increase amount of physical activity.;Long Term: Exercising regularly at least 3-5 days a week.;Long Term: Add in home exercise to make exercise part of routine and to increase amount of physical activity.       Increase Strength and Stamina Yes       Intervention Provide advice, education, support and counseling about physical activity/exercise needs.;Develop an individualized exercise prescription for aerobic and resistive training based on initial evaluation findings, risk stratification, comorbidities and participant's personal goals.       Expected Outcomes Short Term: Increase workloads from initial exercise prescription for  resistance, speed, and METs.;Short Term: Perform resistance training exercises routinely during rehab and add in resistance training at home;Long Term: Improve cardiorespiratory fitness, muscular endurance and strength as measured by increased METs and functional capacity (6MWT)       Able to understand and use rate of perceived exertion (RPE) scale Yes       Intervention Provide education and explanation on how to use RPE scale       Expected Outcomes Short Term: Able to use RPE daily in rehab to express subjective intensity level;Long Term:  Able to use RPE to guide intensity level when exercising independently       Able to understand and use Dyspnea scale Yes       Intervention Provide education and explanation on how to use  Dyspnea scale       Expected Outcomes Long Term: Able to use Dyspnea scale to guide intensity level when exercising independently;Short Term: Able to use Dyspnea scale daily in rehab to express subjective sense of shortness of breath during exertion       Knowledge and understanding of Target Heart Rate Range (THRR) Yes       Intervention Provide education and explanation of THRR including how the numbers were predicted and where they are located for reference       Expected Outcomes Long Term: Able to use THRR to govern intensity when exercising independently;Short Term: Able to state/look up THRR;Short Term: Able to use daily as guideline for intensity in rehab       Able to check pulse independently Yes       Intervention Provide education and demonstration on how to check pulse in carotid and radial arteries.;Review the importance of being able to check your own pulse for safety during independent exercise       Expected Outcomes Short Term: Able to explain why pulse checking is important during independent exercise;Long Term: Able to check pulse independently and accurately       Understanding of Exercise Prescription Yes       Intervention Provide education, explanation,  and written materials on patient's individual exercise prescription       Expected Outcomes Short Term: Able to explain program exercise prescription;Long Term: Able to explain home exercise prescription to exercise independently                Exercise Goals Re-Evaluation :  Exercise Goals Re-Evaluation     Row Name 02/06/22 9678 02/11/22 1544 02/25/22 1340 03/10/22 1603 03/26/22 1313     Exercise Goal Re-Evaluation   Exercise Goals Review Able to understand and use rate of perceived exertion (RPE) scale;Able to understand and use Dyspnea scale;Knowledge and understanding of Target Heart Rate Range (THRR);Understanding of Exercise Prescription Increase Physical Activity;Increase Strength and Stamina;Understanding of Exercise Prescription Increase Physical Activity;Increase Strength and Stamina;Understanding of Exercise Prescription Increase Physical Activity;Increase Strength and Stamina;Understanding of Exercise Prescription Increase Physical Activity;Increase Strength and Stamina;Understanding of Exercise Prescription   Comments Reviewed RPE scale, THR and program prescription with pt today.  Pt voiced understanding and was given a copy of goals to take home. Kyle Avila is off to a good start for the first session he has been here. He was able to do his initial exercise prescription plus more. He increased his level on the XR to 4 and also increased his speed to 2.0 mph. We will continue to monitor as he progresses in the program. Kyle Avila is doing well in rehab. He recently increased his overall average MET level to 3.1 METs. He also has been walking on the treadmill at a speed of 2.3 mph and an incline of 0.5%. He has tolerated 4 lb hand weights for resistance training as well. We will continue to monitor his progress in the program. Kyle Avila has not been to rehab due to having skin issues that were not dry and contained. He was cleared to come back as long as his lessions are dry and crusted, patient  aware. We will continue to monitor patient when he resumes back in rehab. Kyle Avila has not been to rehab due to having skin issues that were not dry and contained. He was cleared to come back as long as his lessions are dry and crusted, patient aware. We will continue to monitor patient when he resumes back  in rehab.   Expected Outcomes Short: Use RPE daily to regulate intensity.  Long: Follow program prescription in THR. Short: Continue to attend rehab and working with machines Long: Continue to build up overall strength and stamina Short: Continue to increase workload on the treadmill. Long: Continue to increase strength and stamina. Short: Return back to rehab with good attendance Long: Continue to increase overall MET level Short: Return back to rehab with good attendance Long: Continue to increase overall MET level    Row Name 04/08/22 0756 04/08/22 1510 04/23/22 1114 05/06/22 1347 05/20/22 1403     Exercise Goal Re-Evaluation   Exercise Goals Review Increase Physical Activity;Increase Strength and Stamina;Understanding of Exercise Prescription Increase Physical Activity;Increase Strength and Stamina;Understanding of Exercise Prescription Increase Physical Activity;Increase Strength and Stamina;Understanding of Exercise Prescription Increase Physical Activity;Increase Strength and Stamina;Understanding of Exercise Prescription Increase Physical Activity;Increase Strength and Stamina;Understanding of Exercise Prescription   Comments Kyle Avila is doing well in rehab.  He is going to [facility] on his off days and uses the stepper.  He is also using the machines for upper body weights.  We talked about using lower body as well. He is also walking some.  He is still not where he was prior to surgery but it is starting to get better.  He is also starting to do more weights/yoga at home.  He is able to do more around the house as well. Kyle Avila has gotten back into a routine with exercise since he was out for several  weeks. He was able to complete a full 27 laps on the track and also increased on both the recumbent bike to level 4 and the XR to level 5!  He is not quite hitting his THR but RPEs have been appropriate. Will continue to monitor. Kyle Avila is doing well in rehab. He has kept his average MET level above 3 METs. He also was able to increase up to 30 laps on the track. He has also done well with 4 lb hand weights and may benefit from trying 5 lb. We will continue to monitor his progress in the program. Patient has not been here since last review. He had a new pacemaker placed. Staff has tried to follow up with patient but has not received a response back yet from the patient. We will continue to attempt calls to receive any updates. Patient has not been here since last review. He had a new pacemaker placed. Staff has tried to follow up with patient but has not received a response back yet from the patient. We will continue to attempt calls to receive any updates.   Expected Outcomes Short: Continue to go to [facility] on off days Long: Continue to improve stamina Short: Continue to increase laps on track Long: Continue to increase overall MET level Short: Try 5 lb hand weights. Long: Continue to improve strength and stamina. Short: Return back to rehab once cleared Long: Graduate from [place]: Return back to rehab once cleared Long: Graduate from [facility]            Discharge Exercise Prescription (Final Exercise Prescription Changes):  Exercise Prescription Changes - 04/23/22 1100       Response to Exercise   Blood Pressure (Admit) 118/62    Blood Pressure (Exercise) 138/66    Blood Pressure (Exit) 122/56    Heart Rate (Admit) 58 bpm    Heart Rate (Exercise) 78 bpm    Heart Rate (Exit) 55 bpm    Rating of Perceived  Exertion (Exercise) 14    Symptoms none    Duration Continue with 30 min of aerobic exercise without signs/symptoms of physical distress.    Intensity THRR unchanged       Progression   Progression Continue to progress workloads to maintain intensity without signs/symptoms of physical distress.    Average METs 3.01      Resistance Training   Training Prescription Yes    Weight 4 lb    Reps 10-15      Interval Training   Interval Training No      REL-XR   Level 4    Minutes 15    METs 4      T5 Nustep   Level 3    Minutes 15    METs 2.4      Track   Laps 30    Minutes 15    METs 2.63      Oxygen   Maintain Oxygen Saturation 88% or higher             Nutrition:  Target Goals: Understanding of nutrition guidelines, daily intake of sodium '1500mg'$ , cholesterol '200mg'$ , calories 30% from fat and 7% or less from saturated fats, daily to have 5 or more servings of fruits and vegetables.  Education: All About Nutrition: -Group instruction provided by verbal, written material, interactive activities, discussions, models, and posters to present general guidelines for heart healthy nutrition including fat, fiber, MyPlate, the role of sodium in heart healthy nutrition, utilization of the nutrition label, and utilization of this knowledge for meal planning. Follow up email sent as well. Written material given at graduation. Flowsheet Row Cardiac Rehab from 04/02/2022 in [facility] Cardiac and Pulmonary Rehab  Date 03/27/22  Educator [facility]  Instruction Review Code 1- Verbalizes Understanding  [part II]       Biometrics:  Pre Biometrics - 02/05/22 1221       Pre Biometrics   Height '5\' 8"'$  (1.727 m)    Weight 172 lb 11.2 oz (78.3 kg)    Waist Circumference 38 inches    Hip Circumference 37.25 inches    Waist to Hip Ratio 1.02 %    BMI (Calculated) 26.26    Single Leg Stand 2.9 seconds              Nutrition Therapy Plan and Nutrition Goals:  Nutrition Therapy & Goals - 02/05/22 0859       Nutrition Therapy   Diet Heart healthy, low Na, CKD stg 4 MNT    Drug/Food Interactions Purine/Gout    Protein (specify units) 70-75g    Fiber 28 grams     Whole Grain Foods 3 servings   or less depending on his potassium   Saturated Fats 15 max. grams    Fruits and Vegetables 8 servings/day    Sodium 1.5 grams      Personal Nutrition Goals   Nutrition Goal ST: inclde, but limit high potassium/phosphorus containing foods. Review paperwork. identify high sources of sodium in the foods he eats regularly.  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    Comments 77 y.o. M admitted to cardiac rehab s/p PTCA. PMHx includes CAD, chronic CHF, CKD stage 4, HTN, HLD, AFib, PVD, CHB with pacemaker, arthritis, gout, lupus, MI (2018), skin cancer. PSHx includes CABG (1988, 1996), stent (2021). Relevant medications includes furosemide, prednisone, bactrim. Recent labs: Basic metabolic panel: BUN 76, creatinine 2.73, GFR 23, BUN/creatinine ratio ratio  87. Franke reports no gout flare-ups in decades, provided handout for his reference if he were to have a flare up again. He has been working with a neprhologist to manage his CKD stg 4 which he reports having since 1992. Discussed considerations for CKD including sodium, potassium, and phosphorus; his last blood result showed his potassium was on the high end of the normal range - recommended limiting high sources of potassium for the day, provided list. Kyle Avila is nervous today as he has been having issues with edema and feels it began when he started the prednisone which his MD is tapering him off of - encouraged him to connect with his MD regarding this as the increased lasix has not been taking off his fluid. We discussed sodium recommendations, food label reading, and sources that are higher in sodium. He reports being hungrier than usual due to the prednisone so he has been having  B: bacon 3x/week, fruits (blueberries, bananas, and dates) with a fiber rich cereal like grape nuts and  2% milk . He will usually have some OJ as well as  coffee. S: english muffin with PB and jelly L: home made tuna or chicken salad sandwich with lettuce D: Chicken, fish. Steamed vegetables ( he buys vegetables fresh). He does not usually have them with any carbohydrates aside from some rice. Drinks: sipping water during the day to help reduce swelling. He reports using greek seasoning as well as another seasoning blend. He uses olive oil and some salt from a grinder. He reports using whole grain bread most of the time. 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 He feels that his sodium may be coming from the seasoning blends so he will check those when he gets home; suggested that if he uses it often, he could make his own seasoning blend without sodium - Kyle Avila reported that he would rather look for one lower in sodium.      Intervention Plan   Intervention Prescribe, educate and counsel regarding individualized specific dietary modifications aiming towards targeted core components such as weight, hypertension, lipid management, diabetes, heart failure and other comorbidities.    Expected Outcomes Short Term Goal: Understand basic principles of dietary content, such as calories, fat, sodium, cholesterol and nutrients.;Long Term Goal: Adherence to prescribed nutrition plan.;Short Term Goal: A plan has been developed with personal nutrition goals set during dietitian appointment.             Nutrition Assessments:  MEDIFICTS Score Key: ?70 Need to make dietary changes  40-70 Heart Healthy Diet ? 40 Therapeutic Level Cholesterol Diet  Flowsheet Row Cardiac Rehab from 02/05/2022 in [facility] Cardiac and Pulmonary Rehab  Picture Your Plate Total Score on Admission 72      Picture Your Plate Scores: <67 Unhealthy dietary pattern with much room for improvement. 41-50 Dietary pattern unlikely to meet recommendations for good health and room for improvement. 51-60 More healthful dietary pattern, with some room for improvement.  >60  Healthy dietary pattern, although there may be some specific behaviors that could be improved.    Nutrition Goals Re-Evaluation:  Nutrition Goals Re-Evaluation     Somers Name 04/08/22 0802             Goals   Nutrition Goal ST: inclde, but limit high potassium/phosphorus containing foods. Review paperwork. identify high sources of sodium in the foods he eats regularly.  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       Comment Kyle Avila is doing well in rehab.  He has had some family move in with him that are helping him cook.  They are all trying to aim for healthy cooking and helping him get more variety.  They are using broiler and Laurance is able to try vegetables in new ways.  He is still trying to cut back on sodium and sugar.       Expected Outcome Short: Continue with new variety Long: Continue to focus on heart healthy eating                Nutrition Goals Discharge (Final Nutrition Goals Re-Evaluation):  Nutrition Goals Re-Evaluation - 04/08/22 0802       Goals   Nutrition Goal ST: inclde, but limit high potassium/phosphorus containing foods. Review paperwork. identify high sources of sodium in the foods he eats regularly.  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    Comment Kyle Avila is doing well in rehab.  He has had some family move in with him that are helping him cook.  They are all trying to aim for healthy cooking and helping him get more variety.  They are using broiler and Kyle Avila is able to try vegetables in new ways.  He is still trying to cut back on sodium and sugar.    Expected Outcome Short: Continue with new variety Long: Continue to focus on heart healthy eating             Psychosocial: Target Goals: Acknowledge presence or absence of significant depression and/or stress, maximize coping  skills, provide positive support system. Participant is able to verbalize types and ability to use techniques and skills needed for reducing stress and depression.   Education: Stress, Anxiety, and Depression - Group verbal and visual presentation to define topics covered.  Reviews how body is impacted by stress, anxiety, and depression.  Also discusses healthy ways to reduce stress and to treat/manage anxiety and depression.  Written material given at graduation. Flowsheet Row Cardiac Rehab from 04/02/2022 in [facility] Cardiac and Pulmonary Rehab  Date 02/20/22  Educator [facility]  Instruction Review Code 1- [employer] Understanding       Education: Sleep Hygiene -Provides group verbal and written instruction about how sleep can affect your health.  Define sleep hygiene, discuss sleep cycles and impact of sleep habits. Review good sleep hygiene tips.    Initial Review & Psychosocial Screening:  Initial Psych Review & Screening - 01/29/22 1426       Initial Review   Current issues with None Identified      Family Dynamics   Good Support System? Yes    Comments He states that he does not have many people for support. He has a few kids and one that is close by.      Barriers   Psychosocial barriers to participate in program There are no identifiable barriers or psychosocial needs.;The patient should benefit from training in stress management and relaxation.      Screening Interventions   Interventions To provide support and resources with identified psychosocial needs;Encouraged to exercise;Provide feedback about the scores to participant    Expected Outcomes Short Term goal: Utilizing psychosocial counselor, staff and physician to assist with identification of specific Stressors or current issues interfering with healing process. Setting desired goal for each stressor or current issue identified.;Long Term Goal: Stressors or current issues are  controlled or eliminated.;Short Term goal:  Identification and review with participant of any Quality of Life or Depression concerns found by scoring the questionnaire.;Long Term goal: The participant improves quality of Life and PHQ9 Scores as seen by post scores and/or verbalization of changes             Quality of Life Scores:   Quality of Life - 02/05/22 1221       Quality of Life   Select Quality of Life      Quality of Life Scores   Health/Function Pre 14.37 %    Socioeconomic Pre 21.75 %    Psych/Spiritual Pre 21.21 %    Family Pre 24.3 %    GLOBAL Pre 18.14 %            Scores of 19 and below usually indicate a poorer quality of life in these areas.  A difference of  2-3 points is a clinically meaningful difference.  A difference of 2-3 points in the total score of the Quality of Life Index has been associated with significant improvement in overall quality of life, self-image, physical symptoms, and general health in studies assessing change in quality of life.  PHQ-9: Review Flowsheet       03/27/2022 02/05/2022 08/03/2017 07/01/2017  Depression screen PHQ 2/9  Decreased Interest 2 0 0 0  Down, Depressed, Hopeless 1 0 0 0  PHQ - 2 Score 3 0 0 0  Altered sleeping 3 3 - -  Tired, decreased energy 3 3 - -  Change in appetite 0 0 - -  Feeling bad or failure about yourself  1 0 - -  Trouble concentrating 0 0 - -  Moving slowly or fidgety/restless 0 0 - -  Suicidal thoughts 0 0 - -  PHQ-9 Score 10 6 - -  Difficult doing work/chores Somewhat difficult Somewhat difficult - -   Interpretation of Total Score  Total Score Depression Severity:  1-4 = Minimal depression, 5-9 = Mild depression, 10-14 = Moderate depression, 15-19 = Moderately severe depression, 20-27 = Severe depression   Psychosocial Evaluation and Intervention:  Psychosocial Evaluation - 01/29/22 1427       Psychosocial Evaluation & Interventions   Interventions Encouraged to exercise with the program and follow exercise  prescription;Relaxation education;Stress management education    Comments He states that he does not have many people for support. He has a few kids and one that is close by.    Expected Outcomes Short: Start [facility] to help with mood. Long: Maintain a healthy mental state    Continue Psychosocial Services  Follow up required by staff             Psychosocial Re-Evaluation:  Psychosocial Re-Evaluation     Row Name 03/27/22 0806 04/08/22 0759           Psychosocial Re-Evaluation   Current issues with Current Stress Concerns;Current Depression Current Stress Concerns;Current Depression      Comments Reviewed patient health questionnaire (PHQ-9) with patient for follow up. Previously, patients score indicated signs/symptoms of depression.  Reviewed to see if patient is improving symptom wise while in program.  Score declined and patient states that it is because they have been dealing with multiple ongoing health issues. He had missed the last month due to Shingles. Hosteen is doing well in rehab.  He tries not to get too stressed out.  His PHQ did decline, but he is trying to have a better attitude and to look for  the good in things.  He is planning to go to [place] for the holidays.  He is starting to come off his prednisone and is starting to sleep better.  Prior to that, he was only getting about 4 hours a night.  He also has frequently trips to bathroom. He is looking forward to continue to decrease his prednisone dose to help with sleep.      Expected Outcomes Short; Get back to regular exercise  routine  Long; Continue to exericse for mental boost Short: Continue to work on sleep Long: Enjoy trip to [place]      Interventions Encouraged to attend Cardiac Rehabilitation for the exercise;Stress management education Encouraged to attend Cardiac Rehabilitation for the exercise;Stress management education      Continue Psychosocial Services  No Follow up required Follow up required by  staff               Psychosocial Discharge (Final Psychosocial Re-Evaluation):  Psychosocial Re-Evaluation - 04/08/22 0759       Psychosocial Re-Evaluation   Current issues with Current Stress Concerns;Current Depression    Comments Kyle Avila is doing well in rehab.  He tries not to get too stressed out.  His PHQ did decline, but he is trying to have a better attitude and to look for the good in things.  He is planning to go to [place] for the holidays.  He is starting to come off his prednisone and is starting to sleep better.  Prior to that, he was only getting about 4 hours a night.  He also has frequently trips to bathroom. He is looking forward to continue to decrease his prednisone dose to help with sleep.    Expected Outcomes Short: Continue to work on sleep Long: Enjoy trip to [place]    Interventions Encouraged to attend Cardiac Rehabilitation for the exercise;Stress management education    Continue Psychosocial Services  Follow up required by staff             Vocational Rehabilitation: Provide vocational rehab assistance to qualifying candidates.   Vocational Rehab Evaluation & Intervention:   Education: Education Goals: Education classes will be provided on a variety of topics geared toward better understanding of heart health and risk factor modification. Participant will state understanding/return demonstration of topics presented as noted by education test scores.  Learning Barriers/Preferences:  Learning Barriers/Preferences - 01/29/22 1425       Learning Barriers/Preferences   Learning Barriers Sight    Learning Preferences Skilled Demonstration;Video             General Cardiac Education Topics:  AED/CPR: - Group verbal and written instruction with the use of models to demonstrate the basic use of the AED with the basic ABC's of resuscitation.   Anatomy and Cardiac Procedures: - Group verbal and visual presentation and models provide information  about basic cardiac anatomy and function. Reviews the testing methods done to diagnose heart disease and the outcomes of the test results. Describes the treatment choices: Medical Management, Angioplasty, or Coronary Bypass Surgery for treating various heart conditions including Myocardial Infarction, Angina, Valve Disease, and Cardiac Arrhythmias.  Written material given at graduation. Flowsheet Row Cardiac Rehab from 04/02/2022 in [facility] Cardiac and Pulmonary Rehab  Education need identified 02/05/22  Date 04/02/22  Educator [place]  Instruction Review Code 1- Verbalizes Understanding       Medication Safety: - Group verbal and visual instruction to review commonly prescribed medications for heart and lung disease.  Reviews the medication, class of the drug, and side effects. Includes the steps to properly store meds and maintain the prescription regimen.  Written material given at graduation.   Intimacy: - Group verbal instruction through game format to discuss how heart and lung disease can affect sexual intimacy. Written material given at graduation..   Know Your Numbers and Heart Failure: - Group verbal and visual instruction to discuss disease risk factors for cardiac and pulmonary disease and treatment options.  Reviews associated critical values for Overweight/Obesity, Hypertension, Cholesterol, and Diabetes.  Discusses basics of heart failure: signs/symptoms and treatments.  Introduces Heart Failure Zone chart for action plan for heart failure.  Written material given at graduation. Flowsheet Row Cardiac Rehab from 04/02/2022 in [facility] Cardiac and Pulmonary Rehab  Date 02/06/22  Educator [facility]  Instruction Review Code 1- Verbalizes Understanding       Infection Prevention: - Provides verbal and written material to individual with discussion of infection control including proper hand washing and proper equipment cleaning during exercise session. Flowsheet Row Cardiac Rehab from 04/02/2022 in  [facility] Cardiac and Pulmonary Rehab  Date 01/29/22  Educator [facility]  Instruction Review Code 1- Verbalizes Understanding       Falls Prevention: - Provides verbal and written material to individual with discussion of falls prevention and safety. Flowsheet Row Cardiac Rehab from 04/02/2022 in [facility] Cardiac and Pulmonary Rehab  Date 01/29/22  Educator [facility]  Instruction Review Code 1- Verbalizes Understanding       Other: -Provides group and verbal instruction on various topics (see comments)   Knowledge Questionnaire Score:   Core Components/Risk Factors/Patient Goals at Admission:  Personal Goals and Risk Factors at Admission - 02/05/22 1224       Core Components/Risk Factors/Patient Goals on Admission    Weight Management Yes;Obesity    Intervention Weight Management: Develop a combined nutrition and exercise program designed to reach desired caloric intake, while maintaining appropriate intake of nutrient and fiber, sodium and fats, and appropriate energy expenditure required for the weight goal.;Weight Management: Provide education and appropriate resources to help participant work on and attain dietary goals.;Weight Management/Obesity: Establish reasonable short term and long term weight goals.;Obesity: Provide education and appropriate resources to help participant work on and attain dietary goals.    Admit Weight 172 lb 11.2 oz (78.3 kg)    Goal Weight: Short Term 168 lb (76.2 kg)    Goal Weight: Long Term 160 lb (72.6 kg)    Expected Outcomes Short Term: Continue to assess and modify interventions until short term weight is achieved;Long Term: Adherence to nutrition and physical activity/exercise program aimed toward attainment of established weight goal;Weight Loss: Understanding of general recommendations for a balanced deficit meal plan, which promotes 1-2 lb weight loss per week and includes a negative energy balance of [phone] kcal/d;Understanding recommendations for meals to  include 15-35% energy as protein, 25-35% energy from fat, 35-60% energy from carbohydrates, less than '200mg'$  of dietary cholesterol, 20-35 gm of total fiber daily;Understanding of distribution of calorie intake throughout the day with the consumption of 4-5 meals/snacks    Heart Failure Yes    Intervention Provide a combined exercise and nutrition program that is supplemented with education, support and counseling about heart failure. Directed toward relieving symptoms such as shortness of breath, decreased exercise tolerance, and extremity edema.    Expected Outcomes Improve functional capacity of life;Short term: Attendance in program 2-3 days a week with increased exercise capacity. Reported lower sodium intake. Reported increased fruit and vegetable  intake. Reports medication compliance.;Short term: Daily weights obtained and reported for increase. Utilizing diuretic protocols set by physician.;Long term: Adoption of self-care skills and reduction of barriers for early signs and symptoms recognition and intervention leading to self-care maintenance.    Hypertension Yes    Intervention Provide education on lifestyle modifcations including regular physical activity/exercise, weight management, moderate sodium restriction and increased consumption of fresh fruit, vegetables, and low fat dairy, alcohol moderation, and smoking cessation.;Monitor prescription use compliance.    Expected Outcomes Long Term: Maintenance of blood pressure at goal levels.;Short Term: Continued assessment and intervention until BP is < 140/66m HG in hypertensive participants. < 130/84mHG in hypertensive participants with diabetes, heart failure or chronic kidney disease.    Lipids Yes    Intervention Provide education and support for participant on nutrition & aerobic/resistive exercise along with prescribed medications to achieve LDL '70mg'$ , HDL >'40mg'$ .    Expected Outcomes Short Term: Participant states understanding of desired  cholesterol values and is compliant with medications prescribed. Participant is following exercise prescription and nutrition guidelines.;Long Term: Cholesterol controlled with medications as prescribed, with individualized exercise RX and with personalized nutrition plan. Value goals: LDL < '70mg'$ , HDL > 40 mg.             Education:Diabetes - Individual verbal and written instruction to review signs/symptoms of diabetes, desired ranges of glucose level fasting, after meals and with exercise. Acknowledge that pre and post exercise glucose checks will be done for 3 sessions at entry of program.   Core Components/Risk Factors/Patient Goals Review:   Goals and Risk Factor Review     Row Name 04/08/22 0804             Core Components/Risk Factors/Patient Goals Review   Personal Goals Review Weight Management/Obesity;Hypertension;Lipids       Review Kyle Avila doing well in rehab.  Overall, his weight is trending down.  He is down under 170 and he wants to get under 160 lb.  His pressures are doing well in rehab.  He does not check them at home.  He does monitor his weight and saturations at home as pressures seem to be stable.       Expected Outcomes Short: Continue to work on weight loss long; Conitnue to monitor risk factors.                Core Components/Risk Factors/Patient Goals at Discharge (Final Review):   Goals and Risk Factor Review - 04/08/22 0804       Core Components/Risk Factors/Patient Goals Review   Personal Goals Review Weight Management/Obesity;Hypertension;Lipids    Review Kyle Avila doing well in rehab.  Overall, his weight is trending down.  He is down under 170 and he wants to get under 160 lb.  His pressures are doing well in rehab.  He does not check them at home.  He does monitor his weight and saturations at home as pressures seem to be stable.    Expected Outcomes Short: Continue to work on weight loss long; Conitnue to monitor risk factors.              ITP Comments:  ITP Comments     Row Name 01/29/22 1422 02/05/22 1215 02/06/22 0906 02/18/22 0828 02/26/22 0955   ITP Comments Virtual Visit completed. Patient informed on EP and RD appointment and 6 Minute walk test. Patient also informed of patient health questionnaires on My Chart. Patient Verbalizes understanding. [facility]. Completed  6MWT and gym orientation. Initial ITP created and sent for review to Dr. Emily Filbert, Medical Director. First full day of exercise!  Patient was oriented to gym and equipment including functions, settings, policies, and procedures.  Patient's individual exercise prescription and treatment plan were reviewed.  All starting workloads were established based on the results of the 6 minute walk test done at initial orientation visit.  The plan for exercise progression was also introduced and progression will be customized based on patient's performance and goals. Fell in yard while doing yardwork, few scratches,no other injury 30 Day review completed. Medical Director ITP review done, changes made as directed, and signed approval by Medical Director.    Kyle Avila Name 03/11/22 1517 03/24/22 1358 03/25/22 1610 03/26/22 0826 04/23/22 1056   ITP Comments Patient called to let us know he will not be in this week as he had a new diagnosis (could not say what), unclear, but has doctor appts this week to follow up. Cardiologist appt on 11/16. Patient appts will be taken out this week and will call us on Thursday to give [place] a better explanation on what is going on. Provider notes from today, 11/14 are not complete yet. Spoke with patient, saw his cardiologist for a "black out" period in which he was told he was dehydrated. States his skin is all cleared up. No other issues. Patient to return thursday 11/30. Unableto complete goals at this time due to lack of attendance with Dr. Kendrick Fries and skin issues that were not safe for rehab. Patient is better and  call cleared up Patient out since 10/31. 30 Day review completed. Medical Director ITP review done, changes made as directed, and signed approval by Medical Director. 30 Day review completed. Medical Director ITP review done, changes made as directed, and signed approval by Medical Director.    Kyle Avila Name 05/13/22 0734 05/21/22 1021         ITP Comments Have not heard back from patient, however, patient is scheduled to have a heart cath on 1/24. We hope to continue to make contact with patient and will wait to see if there is any intervention involved. Has not attended rehab since 12/21. 30 Day review completed. Medical Director ITP review done, changes made as directed, and signed approval by Medical Director.   remains out               Comments:

## 2022-05-22 ENCOUNTER — Encounter: Payer: Medicare HMO | Admitting: *Deleted

## 2022-05-22 ENCOUNTER — Encounter: Payer: Self-pay | Admitting: Cardiovascular Disease

## 2022-05-22 ENCOUNTER — Other Ambulatory Visit: Payer: Self-pay | Admitting: Cardiology

## 2022-05-22 ENCOUNTER — Encounter (HOSPITAL_COMMUNITY): Payer: Self-pay | Admitting: Cardiovascular Disease

## 2022-05-22 DIAGNOSIS — N184 Chronic kidney disease, stage 4 (severe): Secondary | ICD-10-CM

## 2022-05-22 DIAGNOSIS — I25118 Atherosclerotic heart disease of native coronary artery with other forms of angina pectoris: Secondary | ICD-10-CM

## 2022-05-22 DIAGNOSIS — I2129 ST elevation (STEMI) myocardial infarction involving other sites: Secondary | ICD-10-CM | POA: Diagnosis present

## 2022-05-22 DIAGNOSIS — I4892 Unspecified atrial flutter: Secondary | ICD-10-CM | POA: Diagnosis not present

## 2022-05-22 DIAGNOSIS — I4819 Other persistent atrial fibrillation: Secondary | ICD-10-CM | POA: Diagnosis not present

## 2022-05-22 DIAGNOSIS — I25718 Atherosclerosis of autologous vein coronary artery bypass graft(s) with other forms of angina pectoris: Secondary | ICD-10-CM | POA: Diagnosis not present

## 2022-05-22 DIAGNOSIS — I252 Old myocardial infarction: Secondary | ICD-10-CM | POA: Diagnosis present

## 2022-05-22 DIAGNOSIS — Z951 Presence of aortocoronary bypass graft: Secondary | ICD-10-CM | POA: Diagnosis not present

## 2022-05-22 DIAGNOSIS — I5040 Unspecified combined systolic (congestive) and diastolic (congestive) heart failure: Secondary | ICD-10-CM | POA: Diagnosis not present

## 2022-05-22 DIAGNOSIS — Z85828 Personal history of other malignant neoplasm of skin: Secondary | ICD-10-CM | POA: Diagnosis not present

## 2022-05-22 DIAGNOSIS — I5042 Chronic combined systolic (congestive) and diastolic (congestive) heart failure: Secondary | ICD-10-CM

## 2022-05-22 DIAGNOSIS — Z950 Presence of cardiac pacemaker: Secondary | ICD-10-CM | POA: Diagnosis not present

## 2022-05-22 DIAGNOSIS — Z955 Presence of coronary angioplasty implant and graft: Secondary | ICD-10-CM | POA: Diagnosis not present

## 2022-05-22 MED ORDER — APIXABAN 5 MG PO TABS
5.0000 mg | ORAL_TABLET | Freq: Two times a day (BID) | ORAL | Status: DC
  Administered 2022-05-22: 5 mg via ORAL
  Filled 2022-05-22: qty 1

## 2022-05-22 MED ORDER — ALLOPURINOL 100 MG PO TABS: 100.0000 mg | ORAL_TABLET | Freq: Every day | ORAL | Status: DC

## 2022-05-22 MED ORDER — AMLODIPINE BESYLATE 2.5 MG PO TABS
2.5000 mg | ORAL_TABLET | Freq: Every day | ORAL | Status: DC
  Administered 2022-05-22: 2.5 mg via ORAL
  Filled 2022-05-22: qty 1

## 2022-05-22 MED ORDER — PREDNISONE 5 MG PO TABS: 5.0000 mg | ORAL_TABLET | Freq: Every day | ORAL | Status: DC

## 2022-05-22 MED ORDER — SODIUM CHLORIDE 0.9% FLUSH: 3.0000 mL | INTRAVENOUS | Status: DC | PRN

## 2022-05-22 MED ORDER — CLOPIDOGREL BISULFATE 75 MG PO TABS: 75.0000 mg | ORAL_TABLET | Freq: Every day | ORAL | Status: DC

## 2022-05-22 MED ORDER — METOPROLOL SUCCINATE ER 50 MG PO TB24: 50.0000 mg | ORAL_TABLET | Freq: Two times a day (BID) | ORAL | Status: DC

## 2022-05-22 MED ORDER — RANOLAZINE ER 500 MG PO TB12: 500.0000 mg | ORAL_TABLET | Freq: Two times a day (BID) | ORAL | 3 refills | Status: DC

## 2022-05-22 MED ORDER — SODIUM CHLORIDE 0.9 % IV SOLN: 250.0000 mL | INTRAVENOUS | Status: DC | PRN

## 2022-05-22 MED ORDER — AMLODIPINE BESYLATE 5 MG PO TABS: 5.0000 mg | ORAL_TABLET | Freq: Every day | ORAL | Status: DC

## 2022-05-22 MED ORDER — FUROSEMIDE 40 MG PO TABS
80.0000 mg | ORAL_TABLET | Freq: Every day | ORAL | Status: DC
  Administered 2022-05-22: 80 mg via ORAL
  Filled 2022-05-22: qty 2

## 2022-05-22 MED ORDER — EMPAGLIFLOZIN 10 MG PO TABS: 10.0000 mg | ORAL_TABLET | Freq: Every day | ORAL | Status: DC

## 2022-05-22 MED ORDER — ISOSORBIDE MONONITRATE ER 60 MG PO TB24
120.0000 mg | ORAL_TABLET | Freq: Every day | ORAL | Status: DC
  Administered 2022-05-22: 120 mg via ORAL
  Filled 2022-05-22: qty 2

## 2022-05-22 MED ORDER — SODIUM CHLORIDE 0.9% FLUSH: 3.0000 mL | Freq: Two times a day (BID) | INTRAVENOUS | Status: DC

## 2022-05-22 MED ORDER — TAMSULOSIN HCL 0.4 MG PO CAPS
0.4000 mg | ORAL_CAPSULE | Freq: Every day | ORAL | Status: DC
  Administered 2022-05-22: 0.4 mg via ORAL
  Filled 2022-05-22: qty 1

## 2022-05-22 NOTE — Progress Notes (Signed)
Rounding Note    Patient Name: Kyle Avila Date of Encounter: 05/22/2022  [place] Cardiologist: [name], MD   Subjective   No chest pain.   Inpatient Medications    Scheduled Meds:  ranolazine  500 mg Oral BID   sodium chloride flush  3 mL Intravenous Q12H   Continuous Infusions:  PRN Meds: acetaminophen, ondansetron (ZOFRAN) IV   Vital Signs    Vitals:   05/21/22 1925 05/22/22 0009 05/22/22 0414 05/22/22 0712  BP: (!) 150/77 137/73 (!) 152/82 (!) 162/86  Pulse: (!) 59 62 62 61  Resp: '18 18 18 18  '$ Temp: 98 F (36.7 C) 98 F (36.7 C) 98.1 F (36.7 C) 97.6 F (36.4 C)  TempSrc: Oral Oral Oral Oral  SpO2: 95% 96% 94% 96%  Weight: 76.7 kg  77.1 kg   Height: 5' 8.5" (1.74 m)       Intake/Output Summary (Last 24 hours) at 05/22/2022 0921 Last data filed at 05/22/2022 0800 Gross per 24 hour  Intake 658 ml  Output 225 ml  Net 433 ml      05/22/2022    4:14 AM 05/21/2022    7:25 PM 05/21/2022   10:10 AM  Last 3 Weights  Weight (lbs) 170 lb 169 lb 3.2 oz 170 lb  Weight (kg) 77.111 kg 76.749 kg 77.111 kg      Telemetry    Atrial fib - Personally Reviewed  ECG    No AM EKG - Personally Reviewed  Physical Exam   GEN: No acute distress.   Neck: No JVD Cardiac: Irreg irreg. No murmurs  Respiratory: Clear to auscultation bilaterally. GI: Soft, nontender, non-distended  MS: No edema; No deformity. Neuro:  Nonfocal  Psych: Normal affect   Labs    High Sensitivity Troponin:  No results for input(s): "TROPONINIHS" in the last 720 hours.   Chemistry Recent Labs  Lab 05/16/22 1030  NA 141  K 4.0  CL 99  CO2 27  GLUCOSE 126*  BUN 33*  CREATININE 2.10*  CALCIUM 9.5    Lipids No results for input(s): "CHOL", "TRIG", "HDL", "LABVLDL", "LDLCALC", "CHOLHDL" in the last 168 hours.  Hematology Recent Labs  Lab 05/16/22 1030  WBC 5.5  RBC 4.26  HGB 14.7  HCT 44.3  MCV 104*  MCH 34.5*  MCHC 33.2  RDW 14.2  PLT 190    Thyroid No results for input(s): "TSH", "FREET4" in the last 168 hours.  BNPNo results for input(s): "BNP", "PROBNP" in the last 168 hours.  DDimer No results for input(s): "DDIMER" in the last 168 hours.   Radiology    CARDIAC CATHETERIZATION  Result Date: 05/21/2022 1.  Known total occlusion of the RCA 2.  Continued patency of the stented segment from the left main into the native circumflex 3.  Known total occlusion of the proximal LAD at the ostium 4.  Continued patency of the LIMA to LAD with moderate stenosis just beyond the insertion site of the mammary artery, unchanged from prior cardiac cath studies over many years 5.  Continued patency of the saphenous vein graft to PDA with only mild in-stent restenosis in the mid and distal bodies of the graft, both lesions treated last year with scoring balloon angioplasty Recommendations: No targets for PCI.  Continue aggressive medical and antianginal therapies.  Will add Ranexa to his drug regimen as he is already on amlodipine, metoprolol succinate, and high-dose isosorbide.    Cardiac Studies   See cath above  Patient Profile     77 y.o. male with CAD s/p CABG, carotid disease, CKD, complete heart block, HTN, HLD, lupus, MR, VT admitted post cath yesterday. Cardiac cath with patency of LIMA to LAD and SVG to RCA. NO targets for PCI  Assessment & Plan    CAD with angina: Cardiac cath with stable CAD. Patent SVG to RCA and patent LIMA to LAD. Will continue current home medical therapy with addition of Ranexa. OK to d/c home today.   For questions or updates, please contact [place] Please consult [email or web address] for contact info under        Signed, Lauree Chandler, MD  05/22/2022, 9:21 AM

## 2022-05-22 NOTE — Plan of Care (Signed)

## 2022-05-22 NOTE — TOC Progression Note (Addendum)
Transition of Care [facility]) - Progression Note    Patient Details  Name: Kyle Avila MRN: [id number] Date of Birth: 05/15/1945  [facility]) CM/SW Contact  Zenon Mayo, RN Phone Number: 05/22/2022, 7:37 AM  Clinical Narrative:    From home, presents with [place], plan for Left heart cath. Had cath yesterday. TOC following.         Expected Discharge Plan and Services                                               Social Determinants of Health (SDOH) Interventions SDOH Screenings   Food Insecurity: No Food Insecurity (05/22/2022)  Housing: Low Risk  (05/22/2022)  Transportation Needs: No Transportation Needs (05/22/2022)  Utilities: Not At Risk (05/22/2022)  Depression (PHQ2-9): Medium Risk (03/27/2022)  Physical Activity: Insufficiently Active (06/25/2017)  Stress: No Stress Concern Present (06/25/2017)  Tobacco Use: Medium Risk (05/22/2022)    Readmission Risk Interventions     No data to display

## 2022-05-22 NOTE — Progress Notes (Signed)
   05/22/22 1000  Mobility  Activity Ambulated independently in hallway  Level of Assistance Independent  Assistive Device None  Distance Ambulated (ft) 450 ft  Activity Response Tolerated well  Mobility Referral Yes  $Mobility charge 1 Mobility   Mobility Specialist Progress Note  Pre-Mobility: 78 HR During Mobility: 119 HR Post-Mobility: 92 HR  Pt was in chair and agreeable. Had no c/o pain. Left in chair w/ all needs met and call bell in reach.  Lucious Groves Mobility Specialist  Please contact via SecureChat or Rehab office at [phone]

## 2022-05-22 NOTE — Discharge Summary (Signed)
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  Known total occlusion of the RCA 2.  Continued patency of the stented segment from the left main into the native circumflex 3.  Known total occlusion of the proximal LAD at the ostium 4.  Continued patency of the LIMA to LAD with moderate stenosis just beyond the insertion site of the mammary artery, unchanged from prior cardiac cath studies over many years 5.  Continued patency of the saphenous vein graft to PDA with only mild in-stent restenosis in the mid and distal bodies of the graft, both lesions treated last year with scoring balloon angioplasty   Recommendations: No targets for PCI.  Continue aggressive medical and antianginal therapies.  Will add Ranexa to his drug regimen as he is already on amlodipine, metoprolol succinate, and high-dose isosorbide. Diagnostic Dominance: Right     History of Present Illness     Kyle Avila is a 77 y.o. 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 Burt Knack as an outpatient. He was seen in the office on 1/15 by Richardson Dopp, PA-C. At that appointment, patient complained of progressive symptoms of angina for the past month. Described his typical chest discomfort as substernal pressure that occurred with minimal activity. He had been taking SL nitro frequently, and his chest pain had been responding well. Chest pain resolved with rest, nitro. He had been having symptoms with low-level activity, such as walking short distances, consistent with CCS functional class III angina. Denied recent SOB, orthopnea, palpitations. He was seen at the device clinic a week prior, and and his rate responsiveness was turned off and he was programmed at a heart rate of 60 bpm. He continued to have chest pain after the change. He was scheduled for outpatient cardiac catheterization for further evaluation.    Hospital Course     Consultants: None  CAD with exertional angina  - Patient came in for outpatient cardiac catheterization on 1/24 that showed known total occlusion of the RCA, known total occlusion of the proximal LAD. Also showed continued patency of the stented segment of the left main into the native circumflex, continued patency of the LIMA-LAD and SVG-PDA. Overall, CAD was stable. No targets for PCI - Recommended aggressive medical and antianginal therapies  - Started ranexa 500 mg  BID  - Continue imdur 120 mg daily, metoprolol succinate 50 mg BID. Increase amlodipine to 5 mg daily  - Continue plavix. Not on ASA due to eliquis use - Continue repatha - Left radial cath site stable prior to DC. Discussed radial site care  - Ordered BMP to be drawn on Monday 1/29 to assess renal function after cath   Chronic HFmrEF  - Patient  euvolemic on exam  - Continue jardiance, lasix 80 mg daily, metoprolol succinate 50 mg BID - Not on ACE/ARB/Entreso due to CKD    HLD  - Patient on repatha - Last lipid panel from 03/2022 showed LDL 15  HTN  - BP elevated this admission. Note, he did not receive his home BP medications until the morning of DC. Even after receiving morning medications, BP remained elevated  - Instructed patient to keep a BP log between DC and his outpatient follow up  - Increase amlodipine to 5 mg daily-- patient reports having several bottles of amlodipine 2.5 mg at home and does not want a new prescription. He will take 2 pills daily  - Continue lasix 80 mg daily, imdur 120 mg daily, toprol-XL 50 mg BID  Atrial Flutter  - Continue toprol-XL 50 mg BID - Continue eliquis 5 mg BID    Did the patient have an acute coronary syndrome (MI, NSTEMI, STEMI, etc) this admission?:  No                               Did the patient have a percutaneous coronary intervention (stent / angioplasty)?:  No.    Patient seen and examined by Dr. Angelena Form and was deemed stable for discharge.  _____________  Discharge Vitals Blood pressure (!) 162/86, pulse 61, temperature 97.6 F (36.4 C), temperature source Oral, resp. rate 18, height 5' 8.5" (1.74 m), weight 77.1 kg, SpO2 96 %.  Filed Weights   05/21/22 1010 05/21/22 1925 05/22/22 0414  Weight: 77.1 kg 76.7 kg 77.1 kg    Labs & Radiologic Studies    CBC No results for input(s): "WBC", "NEUTROABS", "HGB", "HCT", "MCV", "PLT" in the last 72 hours. Basic Metabolic Panel No results for input(s): "NA", "K", "CL", "CO2", "GLUCOSE", "BUN", "CREATININE", "CALCIUM", "MG", "PHOS" in the last 72 hours. Liver Function Tests No results for input(s): "AST", "ALT", "ALKPHOS", "BILITOT", "PROT", "ALBUMIN" in the last 72 hours. No results for input(s): "LIPASE", "AMYLASE" in the last 72 hours. High Sensitivity Troponin:   No results for input(s): "TROPONINIHS" in the last 720  hours.  BNP Invalid input(s): "POCBNP" D-Dimer No results for input(s): "DDIMER" in the last 72 hours. Hemoglobin A1C No results for input(s): "HGBA1C" in the last 72 hours. Fasting Lipid Panel No results for input(s): "CHOL", "HDL", "LDLCALC", "TRIG", "CHOLHDL", "LDLDIRECT" in the last 72 hours. Thyroid Function Tests No results for input(s): "TSH", "T4TOTAL", "T3FREE", "THYROIDAB" in the last 72 hours.  Invalid input(s): "FREET3" _____________  CARDIAC CATHETERIZATION  Result Date: 05/21/2022 1.  Known total occlusion of the RCA 2.  Continued patency of the stented segment from the left main into the native circumflex 3.  Known total occlusion of the proximal LAD at the ostium 4.  Continued patency of the LIMA to LAD with moderate stenosis just beyond the insertion site of the mammary artery, unchanged from prior cardiac cath studies over many years 5.  Continued patency of the saphenous vein graft to PDA with only mild in-stent restenosis in  the mid and distal bodies of the graft, both lesions treated last year with scoring balloon angioplasty Recommendations: No targets for PCI.  Continue aggressive medical and antianginal therapies.  Will add Ranexa to his drug regimen as he is already on amlodipine, metoprolol succinate, and high-dose isosorbide.   CUP PACEART INCLINIC DEVICE CHECK  Result Date: 05/07/2022 Wound check appointment. Steri-strips removed. Wound without redness or edema. Incision edges approximated, wound well healed. Normal device function. Thresholds, sensing, and impedances consistent with implant measurements. Device programmed at 3.5V/auto capture programmed on for extra safety margin until 3 month visit. Histogram distribution appropriate for patient and level of activity. No mode switches or high ventricular rates noted. Changes made this session: Patient educated about wound care, arm mobility, lifting restrictions. ROV in 3 months with implanting physician. Patient  complaining of feeling "like crap"  heart start racing to a high limit with any level of activity: walking, climbing stairs. Myrtie Hawk, RN assisting with check today discussed patient's symptoms with Oda Kilts, PA-C. It was determined to change the programming from DDDR to DDD, turning off rate response for now. Will see how patient does and re-assess at appointment with Dr. Caryl Comes in 3 months. Patient knows that he can call us sooner if any concerns.Leticia Penna, RN  DG Chest 2 View  Result Date: 04/23/2022 CLINICAL DATA:  Cardiac device in-situ.  Status post BIV ppm upgrade EXAM: CHEST - 2 VIEW COMPARISON:  07/26/2021 FINDINGS: The right chest wall CRT-P has been exchanged. Lead projects over the right atrium and 2 leads over the right ventricle. Sternotomy and CABG. Coronary stenting. Stable cardiomediastinal silhouette. Aortic atherosclerotic calcification. Left basilar atelectasis. No pleural effusion or pneumothorax. IMPRESSION: New right chest wall CRT-P.  No unexpected findings. Electronically Signed   By: Placido Sou M.D.   On: 04/23/2022 19:50   Disposition   Pt is being discharged home today in good condition.  Follow-up Plans & Appointments     Follow-up Information     Liliane Shi, PA-C Follow up on 06/23/2022.   Specialties: Cardiology, Physician Assistant Why: Appointment at 10:30 AM Contact information: [address] [address] [phone]         [name], MD Follow up.   Specialty: Internal Medicine Why: Please follow up in a week. Contact information: [address] [place] [address] [place] [facility] Follow up on 05/26/2022.   Specialty: Cardiology Why: Please present to the [facility] on 1/29 at 11:15 AM Contact information: [address], [place] [id number] [place] [phone]               Discharge  Instructions     AMB referral to Phase II Cardiac Rehabilitation   Complete by: As directed    Diagnosis: Stable Angina   After initial evaluation and assessments completed: Virtual Based Care may be provided alone or in conjunction with Phase 2 Cardiac Rehab based on patient barriers.: Yes   Intensive Cardiac Rehabilitation (ICR) [place] location only OR Traditional Cardiac Rehabilitation (TCR) *If criteria for ICR are not met will enroll in TCR [facility] only): Yes   Diet - low sodium heart healthy   Complete by: As directed    Discharge instructions   Complete by: As directed    Radial Site Care Refer to this sheet in the next few weeks. These instructions provide you with information on  caring for yourself after your procedure. Your caregiver may also give you more specific instructions. Your treatment has been planned according to current medical practices, but problems sometimes occur. Call your caregiver if you have any problems or questions after your procedure. HOME CARE INSTRUCTIONS You may shower the day after the procedure. Remove the bandage (dressing) and gently wash the site with plain soap and water. Gently pat the site dry.  Do not apply powder or lotion to the site.  Do not submerge the affected site in water for 3 to 5 days.  Inspect the site at least twice daily.  Do not flex or bend the affected arm for 24 hours.  No lifting over 5 pounds (2.3 kg) for 5 days after your procedure.  Do not drive home if you are discharged the same day of the procedure. Have someone else drive you.  You may drive 24 hours after the procedure unless otherwise instructed by your caregiver.  What to expect: Any bruising will usually fade within 1 to 2 weeks.  Blood that collects in the tissue (hematoma) may be painful to the touch. It should usually decrease in size and tenderness within 1 to 2 weeks.  SEEK IMMEDIATE MEDICAL CARE IF: You have unusual pain at the radial site.  You have redness,  warmth, swelling, or pain at the radial site.  You have drainage (other than a small amount of blood on the dressing).  You have chills.  You have a fever or persistent symptoms for more than 72 hours.  You have a fever and your symptoms suddenly get worse.  Your arm becomes pale, cool, tingly, or numb.  You have heavy bleeding from the site. Hold pressure on the site.   Increase activity slowly   Complete by: As directed         Discharge Medications   Allergies as of 05/22/2022       Reactions   Amiodarone Other (See Comments)   Lung toxicity         Medication List     TAKE these medications    acetaminophen 500 MG tablet Commonly known as: TYLENOL Take 1,000 mg by mouth every 6 (six) hours as needed for moderate pain.   allopurinol 100 MG tablet Commonly known as: ZYLOPRIM Take 100 mg by mouth at bedtime.   amLODipine 5 MG tablet Commonly known as: NORVASC Take 1 tablet (5 mg total) by mouth daily. What changed:  medication strength how much to take   apixaban 5 MG Tabs tablet Commonly known as: Eliquis Take 1 tablet (5 mg total) by mouth 2 (two) times daily.   azelastine 0.1 % nasal spray Commonly known as: ASTELIN Place 1 spray into both nostrils 2 (two) times daily. Use in each nostril as directed   clopidogrel 75 MG tablet Commonly known as: PLAVIX Take 75 mg by mouth at bedtime.   Eylea 2 MG/0.05ML Soln Generic drug: Aflibercept by Intravitreal route.   furosemide 80 MG tablet Commonly known as: LASIX Take 1 tablet (80 mg total) by mouth daily. What changed: how much to take   isosorbide mononitrate 120 MG 24 hr tablet Commonly known as: IMDUR TAKE 1 TABLET (120 MG TOTAL) BY MOUTH DAILY.   Jardiance 10 MG Tabs tablet Generic drug: empagliflozin TAKE 1 TABLET BY MOUTH DAILY BEFORE BREAKFAST.   ketoconazole 2 % cream Commonly known as: NIZORAL Apply 1 application. topically daily as needed (itching toes).   metoprolol succinate 50 MG  24 hr  tablet Commonly known as: TOPROL-XL TAKE 1 TABLET TWO TIMES DAILY. TAKE WITH OR IMMEDIATELY FOLLOWING MEALS.   nitroGLYCERIN 0.4 MG SL tablet Commonly known as: NITROSTAT PLACE 1 TABLET UNDER THE TONGUE EVERY 5 MINUTES AS NEEDED FOR CHEST PAIN   predniSONE 10 MG tablet Commonly known as: DELTASONE Take 1 tablet (10 mg total) by mouth daily with breakfast. What changed: how much to take   ranolazine 500 MG 12 hr tablet Commonly known as: RANEXA Take 1 tablet (500 mg total) by mouth 2 (two) times daily.   Repatha SureClick 045 MG/ML [place] Generic drug: Evolocumab Inject 140 mg into the skin every 14 (fourteen) days.   tamsulosin 0.4 MG Caps capsule Commonly known as: FLOMAX Take 0.4 mg by mouth daily.           Outstanding Labs/Studies   BMP Monday  Duration of Discharge Encounter   Greater than 30 minutes including physician time.  Signed, Margie Billet, PA-C 05/22/2022, 11:24 AM

## 2022-05-22 NOTE — Care Management CC44 (Signed)
Condition Code 44 Documentation Completed  Patient Details  Name: Kyle Avila MRN: [id number] Date of Birth: [birth year]   Condition Code 44 given:  Yes Patient signature on Condition Code 44 notice:  Yes Documentation of 2 MD's agreement:  Yes Code 44 added to claim:       Zenon Mayo, RN 05/22/2022, 11:50 AM

## 2022-05-22 NOTE — TOC Transition Note (Signed)
Transition of Care [facility]) - CM/SW Discharge Note   Patient Details  Name: Kyle Avila MRN: [id number] Date of Birth: May 20, 1945  Transition [facility]) CM/SW Contact:  Zenon Mayo, RN Phone Number: 05/22/2022, 11:31 AM   Clinical Narrative:    For dc today, has no needs.         Patient Goals and CMS Choice      Discharge Placement                         Discharge Plan and Services Additional resources added to the After Visit Summary for                                       Social Determinants of Health (SDOH) Interventions SDOH Screenings   Food Insecurity: No Food Insecurity (05/22/2022)  Housing: Low Risk  (05/22/2022)  Transportation Needs: No Transportation Needs (05/22/2022)  Utilities: Not At Risk (05/22/2022)  Depression (PHQ2-9): Medium Risk (03/27/2022)  Physical Activity: Insufficiently Active (06/25/2017)  Stress: No Stress Concern Present (06/25/2017)  Tobacco Use: Medium Risk (05/22/2022)     Readmission Risk Interventions     No data to display

## 2022-05-22 NOTE — Care Management Obs Status (Signed)
[place] NOTIFICATION   Patient Details  Name: Kyle Avila MRN: [id number] Date of Birth: [birth year]   Medicare Observation Status Notification Given:  Yes    Zenon Mayo, RN 05/22/2022, 11:50 AM

## 2022-05-23 ENCOUNTER — Encounter: Payer: Self-pay | Admitting: Cardiovascular Disease

## 2022-05-23 LAB — LIPOPROTEIN A (LPA): Lipoprotein (a): 17.1 nmol/L (ref <75.0)

## 2022-05-26 ENCOUNTER — Telehealth: Payer: Self-pay

## 2022-05-26 ENCOUNTER — Encounter (HOSPITAL_COMMUNITY): Payer: Self-pay | Admitting: Cardiology

## 2022-05-26 ENCOUNTER — Ambulatory Visit
Admission: RE | Admit: 2022-05-26 | Discharge: 2022-05-26 | Disposition: A | Discharge status: Home / Self Care | Payer: Medicare HMO | Source: Ambulatory Visit | Attending: Cardiology | Admitting: Cardiology

## 2022-05-26 ENCOUNTER — Ambulatory Visit: Payer: Medicare HMO

## 2022-05-26 VITALS — BP 104/60 | HR 80 | Wt 177.0 lb

## 2022-05-26 DIAGNOSIS — J703 Chronic drug-induced interstitial lung disorders: Secondary | ICD-10-CM | POA: Insufficient documentation

## 2022-05-26 DIAGNOSIS — Z950 Presence of cardiac pacemaker: Secondary | ICD-10-CM | POA: Diagnosis not present

## 2022-05-26 DIAGNOSIS — Z7952 Long term (current) use of systemic steroids: Secondary | ICD-10-CM | POA: Diagnosis not present

## 2022-05-26 DIAGNOSIS — I25119 Atherosclerotic heart disease of native coronary artery with unspecified angina pectoris: Secondary | ICD-10-CM

## 2022-05-26 DIAGNOSIS — I5022 Chronic systolic (congestive) heart failure: Secondary | ICD-10-CM | POA: Diagnosis not present

## 2022-05-26 DIAGNOSIS — J984 Other disorders of lung: Secondary | ICD-10-CM | POA: Diagnosis not present

## 2022-05-26 DIAGNOSIS — I779 Disorder of arteries and arterioles, unspecified: Secondary | ICD-10-CM | POA: Diagnosis not present

## 2022-05-26 DIAGNOSIS — I2510 Atherosclerotic heart disease of native coronary artery without angina pectoris: Secondary | ICD-10-CM | POA: Insufficient documentation

## 2022-05-26 DIAGNOSIS — R0609 Other forms of dyspnea: Secondary | ICD-10-CM | POA: Insufficient documentation

## 2022-05-26 DIAGNOSIS — I130 Hypertensive heart and chronic kidney disease with heart failure and stage 1 through stage 4 chronic kidney disease, or unspecified chronic kidney disease: Secondary | ICD-10-CM | POA: Insufficient documentation

## 2022-05-26 DIAGNOSIS — T462X5A Adverse effect of other antidysrhythmic drugs, initial encounter: Secondary | ICD-10-CM

## 2022-05-26 DIAGNOSIS — I4819 Other persistent atrial fibrillation: Secondary | ICD-10-CM | POA: Diagnosis not present

## 2022-05-26 DIAGNOSIS — I255 Ischemic cardiomyopathy: Secondary | ICD-10-CM | POA: Insufficient documentation

## 2022-05-26 DIAGNOSIS — Z951 Presence of aortocoronary bypass graft: Secondary | ICD-10-CM | POA: Insufficient documentation

## 2022-05-26 DIAGNOSIS — N184 Chronic kidney disease, stage 4 (severe): Secondary | ICD-10-CM

## 2022-05-26 DIAGNOSIS — Z7902 Long term (current) use of antithrombotics/antiplatelets: Secondary | ICD-10-CM | POA: Diagnosis not present

## 2022-05-26 DIAGNOSIS — Z79899 Other long term (current) drug therapy: Secondary | ICD-10-CM | POA: Insufficient documentation

## 2022-05-26 DIAGNOSIS — Z9861 Coronary angioplasty status: Secondary | ICD-10-CM

## 2022-05-26 DIAGNOSIS — I5042 Chronic combined systolic (congestive) and diastolic (congestive) heart failure: Secondary | ICD-10-CM | POA: Diagnosis not present

## 2022-05-26 DIAGNOSIS — E785 Hyperlipidemia, unspecified: Secondary | ICD-10-CM | POA: Diagnosis not present

## 2022-05-26 DIAGNOSIS — I442 Atrioventricular block, complete: Secondary | ICD-10-CM | POA: Diagnosis not present

## 2022-05-26 DIAGNOSIS — Z7901 Long term (current) use of anticoagulants: Secondary | ICD-10-CM | POA: Insufficient documentation

## 2022-05-26 NOTE — Progress Notes (Signed)
ADVANCED HEART FAILURE CLINIC NOTE  Referring Physician: [name], MD  Primary Care: [name], MD Primary Cardiologist:  HPI: Kyle Avila is a 77 y.o. 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   Kyle Avila has been followed by Dr. Burt Avila and Dr. Caryl Comes for several years now. He has undergone numerous interventions for CAD and vascular disease as noted above. Despite this he continues to have significantly worsening functional status. According to Kyle Avila, he was skiing over a year ago but now does not believe he can partake in activities requiring significant physical exertion due to fatigue and dyspnea. 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   Interval hx:  Since her last appointment Kyle Avila, has had upgrade to his dual-chamber pacemaker to a biventricular pacemaker due to chronic RV pacing and atrial fibrillation.  Due to severe unstable angina he also underwent left heart cath several days ago by Dr. Burt Avila that showed stable severe coronary artery disease.  He was started on Ranexa after that.  Since starting Ranexa roughly 5 days ago he has had significant improvement in chest pressure; walked quarter mile w/o significant difficulty. No longer having PND.   Activity level/exercise tolerance:  NYHA 2B Orthopnea:  Sleeps on 2 pillows Paroxysmal noctural dyspnea: No, resolved Chest pain/pressure: No, resolved since starting Ranexa Orthostatic lightheadedness:  no Palpitations:  no Lower extremity  edema: 1-2+ on exam today. Presyncope/syncope:  no Cough:  no  Past Medical History:  Diagnosis Date   Arthritis    "some in my back" (04/14/2017)   CAD (coronary artery disease)    a.  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; lupus attacked them" (04/14/2017)   Mitral insufficiency    Moderate mitral regurgitation 10/02/2021   Echocardiogram 6/23: EF 52, global HK, mild ASH, mildly reduced RVSF, normal PASP, RVSP 31, mod LAD, mild RAE, mod MR, trivial AI, AV sclerosis w/o stenosis   Myocardial infarction ([place])    "I've had some mild one" (04/14/2017)   Presence of permanent cardiac pacemaker    Skin cancer    "nose"   VT (ventricular tachycardia)-nonsustained 05/20/2011    Current Outpatient Medications  Medication Sig Dispense Refill   acetaminophen (TYLENOL) 500 MG tablet Take 1,000 mg by mouth every 6 (six) hours as needed for moderate pain.     Aflibercept (EYLEA) 2 MG/0.05ML SOLN by Intravitreal route.     allopurinol (ZYLOPRIM) 100 MG tablet Take 100 mg by mouth at bedtime.     amLODipine (NORVASC) 5 MG tablet Take 1 tablet (5 mg total) by mouth daily.     apixaban (ELIQUIS) 5 MG TABS tablet  Take 1 tablet (5 mg total) by mouth 2 (two) times daily. 180 tablet 2   azelastine (ASTELIN) 0.1 % nasal spray Place 1 spray into both nostrils 2 (two) times daily. Use in each nostril as directed     clopidogrel (PLAVIX) 75 MG tablet Take 75 mg by mouth at bedtime.     empagliflozin (JARDIANCE) 10 MG TABS tablet TAKE 1 TABLET BY MOUTH DAILY BEFORE BREAKFAST. 90 tablet 2   furosemide (LASIX) 80 MG tablet Take 1 tablet (80 mg total) by mouth daily. 90 tablet 3   isosorbide mononitrate (IMDUR) 120 MG 24 hr tablet TAKE 1 TABLET (120 MG TOTAL)  BY MOUTH DAILY. 90 tablet 3   ketoconazole (NIZORAL) 2 % cream Apply 1 application. topically daily as needed (itching toes).     metoprolol succinate (TOPROL-XL) 50 MG 24 hr tablet TAKE 1 TABLET TWO TIMES DAILY. TAKE WITH OR IMMEDIATELY FOLLOWING MEALS. 180 tablet 3   nitroGLYCERIN (NITROSTAT) 0.4 MG SL tablet PLACE 1 TABLET UNDER THE TONGUE EVERY 5 MINUTES AS NEEDED FOR CHEST PAIN 25 tablet 3   predniSONE (DELTASONE) 5 MG tablet Take 5 mg by mouth daily with breakfast.     ranolazine (RANEXA) 500 MG 12 hr tablet Take 1 tablet (500 mg total) by mouth 2 (two) times daily. 180 tablet 3   REPATHA SURECLICK 785 MG/ML SOAJ Inject 140 mg into the skin every 14 (fourteen) days. 6 mL 3   tamsulosin (FLOMAX) 0.4 MG CAPS Take 0.4 mg by mouth daily.     No current facility-administered medications for this encounter.    Allergies  Allergen Reactions   Amiodarone Other (See Comments)    Lung toxicity       Social History   Socioeconomic History   Marital status: Married    Spouse name: Not on file   Number of children: Not on file   Years of education: Not on file   Highest education level: Not on file  Occupational History   Not on file  Tobacco Use   Smoking status: Never    Passive exposure: Yes   Smokeless tobacco: Never   Tobacco comments:    Never smoke 10/21/21  Vaping Use   Vaping Use: Never used  Substance and Sexual Activity   Alcohol use: Yes    Alcohol/week: 3.0 standard drinks of alcohol    Types: 1 Glasses of wine, 1 Cans of beer, 1 Standard drinks or equivalent per week    Comment: 2-3 drinks per week 10/21/21   Drug use: No   Sexual activity: Not Currently  Other Topics Concern   Not on file  Social History Narrative   Not on file   Social Determinants of Health   Financial Resource Strain: Not on file  Food Insecurity: No Food Insecurity (05/22/2022)   Hunger Vital Sign    Worried About Running Out of Food in the Last Year: Never true    Ran Out of Food in  the Last Year: Never true  Transportation Needs: No Transportation Needs (05/22/2022)   PRAPARE - Hydrologist (Medical): No    Lack of Transportation (Non-Medical): No  Physical Activity: Insufficiently Active (06/25/2017)   Exercise Vital Sign    Days of Exercise per Week: 4 days    Minutes of Exercise per Session: 30 min  Stress: No Stress Concern Present (06/25/2017)   [place]    Feeling of Stress : Not  at all  Social Connections: Not on file  Intimate Partner Violence: Not At Risk (05/22/2022)   Humiliation, Afraid, Rape, and Kick questionnaire    Fear of Current or Ex-Partner: No    Emotionally Abused: No    Physically Abused: No    Sexually Abused: No      Family History  Problem Relation Age of Onset   Heart attack Father        52s   Heart disease Father        Heart Disease before age 59   Cancer Mother     PHYSICAL EXAM: Vitals:   05/26/22 1536  BP: 104/60  Pulse: 80  SpO2: 95%   GENERAL: White male in no apparent distress. HEENT: Negative for arcus senilis or xanthelasma. There is no scleral icterus.  The mucous membranes are pink and moist.   NECK: Supple, No masses. Normal carotid upstrokes without bruits. No masses or thyromegaly.    CHEST: There are no chest wall deformities. There is no chest wall tenderness. Respirations are unlabored.  Lungs- coarse lung sounds but otherwise CTA CARDIAC:  JVP: <10         Normal S1, S2  Normal rate with regular rhythm. No murmurs, rubs or gallops.  Pulses are 2+ and symmetrical in upper and lower extremities.  1-2+ pitting edema ABDOMEN: Soft, non-tender, non-distended. There are no masses or hepatomegaly. There are normal bowel sounds.  EXTREMITIES: Warm and well-perfused LYMPHATIC: No axillary or supraclavicular lymphadenopathy.  NEUROLOGIC: Patient is oriented x3 with no focal or lateralizing neurologic deficits.  PSYCH:  Patients affect is appropriate, there is no evidence of anxiety or depression.  SKIN: Warm and dry; no lesions or wounds.   DATA REVIEW  JJO:ACZYSA fibrillation with V paced rhythm  ECHO: 02/26/22: LVEF 45-50% with inferior/inferolateral hypokinesis, normal RV function.  10/01/21: LVEF 45%, RV function mildly reduced 08/19/19: LVEF 45-50%, RVF normal.   CATH: LHC: 08/15/21:  Severe triple vessel CAD s/p CABG with 2/3 patent bypass grafts Chronic occlusion of the ostial LAD. The mid and distal LAD fills from the patent LIMA graft. Stable moderate stenosis at the anastomosis of the LIMA gaft to the LAD Chronic occlusion of the intermediate branch that fills from left to left collaterals supplied by the LIMA to LAD.  Patent proximal and mid Circumflex stents. Known occlusion of SVG to OM (non injected) Chronic occlusion ostial RCA (non injected) Patent SVG to PDA. Severe stenosis in the stented segment in the proximal SVG and severe stenosis in the stented segment in the distal SVG.  Successful PTCA with scoring balloon angioplasty of both stented segments in the SVG to PDA Normal LVEDP   ASSESSMENT & PLAN:  Ischemic cardiomyopathy with mildly reduced LVEF Etiology of YT:KZSWFUXN, inferolateral hypokinesis, multiple PCIs and CABG as noted above NYHA class / AHA Stage:III Volume status & Diuretics: euvolemic, taking lasix '80mg'$  daily on most days.  I have asked him to add another 40 mg today. Vasodilators:Amlodipine '5mg'$ , losartan recently held. Will plan to add back ARB or ARNI on follow up. Kyle Avila is concerned about his renal function currently.  Currently wants to watch his renal function. Beta-Blocker:metoprolol succinate '50mg'$  daily, will uptitrate at follow-up. ATF:TDDUKGU due to CKD Cardiometabolic:continue Jardiance 10 mg Devices therapies & Valvulopathies: In late December/early January 2024 Kyle Avila had significant progression of unstable angina.  He had a left heart cath that  was stable with severe CAD as noted above.  He was started on Ranexa 500  mg twice daily.  He notes complete resolution of angina since addition of Ranexa.  Has now started walking 1/4 mile daily.  In addition underwent upgrade from dual-chamber pacemaker to biventricular pacemaker due to 100% RV pacing.  He has also completed prednisone for pulmonary fibrosis from amiodarone.  With these changes he has continued to feel improvement in exercise capacity.  Will continue to monitor for the time being.  2. CAD - multiple interventions has noted above. Likely etiology of a significant portion of his dyspnea.  - LHC recently with stable CAD; started on ranexa '500mg'$  BID two weeks ago with significant improvement in angina.   3. CKD  - sCr improving - jardiance '10mg'$  daily.  4. Persistent atrial fibrillation - Followed closely by Dr. Caryl Comes - Bi-V pacemaker  5. Carotid artery disease - s/p CEA  6. Amiodarone lung toxicity  - PFTs w/ moderately reduced DLCO but no other signs of restrictive/obstructive disease.  - Continuing prednisone taper  Kinsey Cowsert Advanced Heart Failure Mechanical Circulatory Support

## 2022-05-26 NOTE — Telephone Encounter (Signed)
Spoke with patient as cardiac rehab received a new referral on patient. Patient has currently been on medical hold. Patient stated he doesn't think he plans on coming back but would like to see his doctors this week to discuss. Told him we will remain him on medical hold for another week and he will call us back and let us know what he decides.

## 2022-05-26 NOTE — Patient Instructions (Signed)
TAKE an extra 40 mg of lasix today.  Your physician recommends that you schedule a follow-up appointment in: 6 months ( July 2024)  **please call the office in May to arrange your follow up appointment. **  If you have any questions or concerns before your next appointment please send [place] a message through [place] or call our office at [phone].    TO LEAVE A MESSAGE FOR THE NURSE SELECT OPTION 2, PLEASE LEAVE A MESSAGE INCLUDING: YOUR NAME DATE OF BIRTH CALL BACK NUMBER REASON FOR CALL**this is important as we prioritize the call backs  YOU WILL RECEIVE A CALL BACK THE SAME DAY AS LONG AS YOU CALL BEFORE 4:00 PM  At the [facility], you and your health needs are our priority. As part of our continuing mission to provide you with exceptional heart care, we have created designated Provider Care Teams. These Care Teams include your primary Cardiologist (physician) and Advanced Practice Providers (APPs- Physician Assistants and Nurse Practitioners) who all work together to provide you with the care you need, when you need it.   You may see any of the following providers on your designated Care Team at your next follow up: Dr Glori Bickers Dr Loralie Champagne Dr. Roxana Hires, NP Lyda Jester, [place] [facility] [place], [place] Forestine Na, NP Audry Riles, PharmD   Please be sure to bring in all your medications bottles to every appointment.    Thank you for choosing [facility]

## 2022-05-27 ENCOUNTER — Encounter: Payer: Self-pay | Admitting: Cardiovascular Disease

## 2022-05-27 LAB — BASIC METABOLIC PANEL
BUN/Creatinine Ratio: 15 (ref 10–24)
BUN: 38 mg/dL — ABNORMAL HIGH (ref 8–27)
CO2: 24 mmol/L (ref 20–29)
Calcium: 9.5 mg/dL (ref 8.6–10.2)
Chloride: 99 mmol/L (ref 96–106)
Creatinine, Ser: 2.52 mg/dL — ABNORMAL HIGH (ref 0.76–1.27)
Glucose: 101 mg/dL — ABNORMAL HIGH (ref 70–99)
Potassium: 4.7 mmol/L (ref 3.5–5.2)
Sodium: 142 mmol/L (ref 134–144)
eGFR: 26 mL/min/{1.73_m2} — ABNORMAL LOW (ref >59)

## 2022-05-28 ENCOUNTER — Encounter: Payer: Self-pay | Admitting: Internal Medicine

## 2022-06-02 ENCOUNTER — Encounter: Payer: Self-pay | Admitting: Cardiovascular Disease

## 2022-06-03 ENCOUNTER — Encounter: Payer: Self-pay | Admitting: Cardiovascular Disease

## 2022-06-03 ENCOUNTER — Ambulatory Visit: Payer: Medicare HMO | Admitting: Cardiovascular Disease

## 2022-06-04 ENCOUNTER — Other Ambulatory Visit (HOSPITAL_COMMUNITY): Payer: Self-pay | Admitting: Cardiovascular Disease

## 2022-06-04 ENCOUNTER — Other Ambulatory Visit: Payer: Self-pay

## 2022-06-04 MED ORDER — EMPAGLIFLOZIN 10 MG PO TABS: 10.0000 mg | ORAL_TABLET | Freq: Every day | ORAL | 3 refills | Status: DC

## 2022-06-04 MED ORDER — CLOPIDOGREL BISULFATE 75 MG PO TABS: 75.0000 mg | ORAL_TABLET | Freq: Every day | ORAL | 3 refills | Status: DC

## 2022-06-04 NOTE — Telephone Encounter (Signed)
Pt's medication was sent to pt's pharmacy as requested. Confirmation received.  °

## 2022-06-04 NOTE — Telephone Encounter (Signed)
Prescription refill request for Eliquis received. Indication: AF Last office visit: 05/26/22  A Sabharwal DO Scr: 2.52 on 05/26/22 Age: 77 Weight: 80.3kg  Based on above findings Eliquis '5mg'$  twice daily is the appropriate dose.  Refill approved.

## 2022-06-05 NOTE — Telephone Encounter (Signed)
Attempted to call patient to follow up- attempted 2x and received a pick up and then hang up right after. Will send letter at this time.

## 2022-06-16 DIAGNOSIS — I130 Hypertensive heart and chronic kidney disease with heart failure and stage 1 through stage 4 chronic kidney disease, or unspecified chronic kidney disease: Secondary | ICD-10-CM | POA: Diagnosis not present

## 2022-06-16 DIAGNOSIS — N184 Chronic kidney disease, stage 4 (severe): Secondary | ICD-10-CM | POA: Diagnosis not present

## 2022-06-16 DIAGNOSIS — K5900 Constipation, unspecified: Secondary | ICD-10-CM | POA: Diagnosis not present

## 2022-06-16 DIAGNOSIS — I480 Paroxysmal atrial fibrillation: Secondary | ICD-10-CM | POA: Diagnosis not present

## 2022-06-16 NOTE — Telephone Encounter (Signed)
Per verbal order of Burt Knack in clinic, have pt increase his Amlodipine from 91m to 179mdaily until he's seen by ScNicki Reapern 1 week. Patient made aware via MyChart.

## 2022-06-18 ENCOUNTER — Telehealth: Payer: Self-pay | Admitting: Pulmonary Disease

## 2022-06-18 ENCOUNTER — Encounter: Payer: Self-pay | Admitting: *Deleted

## 2022-06-18 ENCOUNTER — Other Ambulatory Visit: Payer: Self-pay

## 2022-06-18 DIAGNOSIS — Z9861 Coronary angioplasty status: Secondary | ICD-10-CM

## 2022-06-18 DIAGNOSIS — J324 Chronic pansinusitis: Secondary | ICD-10-CM

## 2022-06-18 DIAGNOSIS — R053 Chronic cough: Secondary | ICD-10-CM

## 2022-06-18 DIAGNOSIS — T462X1D Poisoning by other antidysrhythmic drugs, accidental (unintentional), subsequent encounter: Secondary | ICD-10-CM

## 2022-06-18 NOTE — Progress Notes (Signed)
Discharge Summary: Kyle Avila October 12, 1945   [facility] discharged today from  rehab with 19 sessions completed.  Details of the patient's exercise prescription and what he  needs to do in order to continue the prescription and progress were discussed with patient.     6 Minute Walk     Row Name 02/05/22 1216         6 Minute Walk   Phase Initial     Distance 1110 feet     Walk Time 6 minutes     # of Rest Breaks 0     MPH 2.1     METS 2.02     RPE 13     Perceived Dyspnea  2     VO2 Peak 7.06     Symptoms Yes (comment)     Comments SOB, chest pain with arm pain 4/10     Resting HR 58 bpm     Resting BP 128/64     Resting Oxygen Saturation  98 %     Exercise Oxygen Saturation  during 6 min walk 98 %     Max Ex. HR 75 bpm     Max Ex. BP 128/60     2 Minute Post BP 124/60

## 2022-06-18 NOTE — Telephone Encounter (Signed)
Yes ok for CT high resolution chest

## 2022-06-18 NOTE — Progress Notes (Signed)
Cardiac Individual Treatment Plan  Patient Details  Name: LEVAR KNIERIM MRN: [id number] Date of Birth: 03-19-1946 Referring Provider:   Flowsheet Row Cardiac Rehab from 02/05/2022 in [facility] Cardiac and Pulmonary Rehab  Referring Provider [name] MD       Initial Encounter Date:  Flowsheet Row Cardiac Rehab from 02/05/2022 in [facility] Cardiac and Pulmonary Rehab  Date 02/05/22       Visit Diagnosis: S/P PTCA (percutaneous transluminal coronary angioplasty)  Patient's Home Medications on Admission:  Current Outpatient Medications:    acetaminophen (TYLENOL) 500 MG tablet, Take 1,000 mg by mouth every 6 (six) hours as needed for moderate pain., Disp: , Rfl:    Aflibercept (EYLEA) 2 MG/0.05ML SOLN, by Intravitreal route., Disp: , Rfl:    allopurinol (ZYLOPRIM) 100 MG tablet, Take 100 mg by mouth at bedtime., Disp: , Rfl:    amLODipine (NORVASC) 5 MG tablet, Take 1 tablet (5 mg total) by mouth daily., Disp: , Rfl:    apixaban (ELIQUIS) 5 MG TABS tablet, TAKE 1 TABLET TWICE DAILY, Disp: 180 tablet, Rfl: 1   azelastine (ASTELIN) 0.1 % nasal spray, Place 1 spray into both nostrils 2 (two) times daily. Use in each nostril as directed, Disp: , Rfl:    clopidogrel (PLAVIX) 75 MG tablet, Take 1 tablet (75 mg total) by mouth at bedtime., Disp: 90 tablet, Rfl: 3   empagliflozin (JARDIANCE) 10 MG TABS tablet, Take 1 tablet (10 mg total) by mouth daily before breakfast., Disp: 90 tablet, Rfl: 3   furosemide (LASIX) 80 MG tablet, Take 1 tablet (80 mg total) by mouth daily., Disp: 90 tablet, Rfl: 3   isosorbide mononitrate (IMDUR) 120 MG 24 hr tablet, TAKE 1 TABLET (120 MG TOTAL) BY MOUTH DAILY., Disp: 90 tablet, Rfl: 3   ketoconazole (NIZORAL) 2 % cream, Apply 1 application. topically daily as needed (itching toes)., Disp: , Rfl:    metoprolol succinate (TOPROL-XL) 50 MG 24 hr tablet, TAKE 1 TABLET TWO TIMES DAILY. TAKE WITH OR IMMEDIATELY FOLLOWING MEALS., Disp: 180 tablet, Rfl: 3    nitroGLYCERIN (NITROSTAT) 0.4 MG SL tablet, PLACE 1 TABLET UNDER THE TONGUE EVERY 5 MINUTES AS NEEDED FOR CHEST PAIN, Disp: 25 tablet, Rfl: 3   predniSONE (DELTASONE) 5 MG tablet, Take 5 mg by mouth daily with breakfast., Disp: , Rfl:    ranolazine (RANEXA) 500 MG 12 hr tablet, Take 1 tablet (500 mg total) by mouth 2 (two) times daily., Disp: 180 tablet, Rfl: 3   REPATHA SURECLICK [id number] MG/ML SOAJ, Inject 140 mg into the skin every 14 (fourteen) days., Disp: 6 mL, Rfl: 3   tamsulosin (FLOMAX) 0.4 MG CAPS, Take 0.4 mg by mouth daily., Disp: , Rfl:   Past Medical History: Past Medical History:  Diagnosis Date   Arthritis    "some in my back" (04/14/2017)   CAD (coronary artery disease)    a.  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[place])    s/p PTVDP   Gout    HTN (hypertension)    Hyperlipidemia    Lupus ([place])    "that's why my kidneys are gone; lupus attacked them" (04/14/2017)   Mitral insufficiency    Moderate mitral regurgitation 10/02/2021   Echocardiogram 6/23: EF 45, global HK, mild ASH, mildly reduced RVSF, normal PASP, RVSP 31, mod LAD, mild RAE,  mod MR, trivial AI, AV sclerosis w/o stenosis   Myocardial infarction ([place])    "I've had some mild one" (04/14/2017)   Presence of permanent cardiac pacemaker    Skin cancer    "nose"   VT (ventricular tachycardia)-nonsustained 05/20/2011    Tobacco Use: Social History   Tobacco Use  Smoking Status Never   Passive exposure: Yes  Smokeless Tobacco Never  Tobacco Comments   Never smoke 10/21/21    Labs: Review Flowsheet  More data exists      Latest Ref Rng & Units 05/04/2015 02/12/2016 10/19/2019 10/28/2019 04/07/2022  Labs for ITP Cardiac and Pulmonary Rehab  Cholestrol 100 - 199 mg/dL 159  159  90  133  96   LDL (calc) 0 - 99 mg/dL 84  87  NEG 2  52  15   Direct LDL 0 - 99 mg/dL - - - 47  -   HDL-C >39 mg/dL 51  46  37  41  57   Trlycerides 0 - 149 mg/dL 118  130  275  254  145      Exercise Target Goals: Exercise Program Goal: Individual exercise prescription set using results from initial 6 min walk test and THRR while considering  patient's activity barriers and safety.   Exercise Prescription Goal: Initial exercise prescription builds to 30-45 minutes a day of aerobic activity, 2-3 days per week.  Home exercise guidelines will be given to patient during program as part of exercise prescription that the participant will acknowledge.   Education: Aerobic Exercise: - Group verbal and visual presentation on the components of exercise prescription. Introduces F.I.T.T principle from ACSM for exercise prescriptions.  Reviews F.I.T.T. principles of aerobic exercise including progression. Written material given at graduation.   Education: Resistance Exercise: - Group verbal and visual presentation on the components of exercise prescription. Introduces F.I.T.T principle from ACSM for exercise prescriptions  Reviews F.I.T.T. principles of resistance exercise including progression. Written material given at graduation.    Education: Exercise & Equipment Safety: - Individual verbal instruction and demonstration of equipment use and safety with use of the equipment. Flowsheet Row Cardiac Rehab from 04/02/2022 in [facility] Cardiac and Pulmonary Rehab  Date 01/29/22  Educator [facility]  Instruction Review Code 1- Verbalizes Understanding       Education: Exercise Physiology & General Exercise Guidelines: - Group verbal and written instruction with models to review the exercise physiology of the cardiovascular system and associated critical values. Provides general exercise guidelines with specific guidelines to those with heart or lung disease.    Education: Flexibility, Balance, Mind/Body Relaxation: - Group verbal and visual presentation with interactive activity on the components of  exercise prescription. Introduces F.I.T.T principle from ACSM for exercise prescriptions. Reviews F.I.T.T. principles of flexibility and balance exercise training including progression. Also discusses the mind body connection.  Reviews various relaxation techniques to help reduce and manage stress (i.e. Deep breathing, progressive muscle relaxation, and visualization). Balance handout provided to take home. Written material given at graduation.   Activity Barriers & Risk Stratification:  Activity Barriers & Cardiac Risk Stratification - 02/05/22 1217       Activity Barriers & Cardiac Risk Stratification   Activity Barriers Other (comment);Deconditioning;Muscular Weakness;Shortness of Breath;Decreased Ventricular Function;Balance Concerns;Chest Pain/Angina    Comments quick onset of fatigue (rushing especially)    Cardiac Risk Stratification High             6 Minute Walk:  6 Minute Walk     Row Name 02/05/22  1216         6 Minute Walk   Phase Initial     Distance 1110 feet     Walk Time 6 minutes     # of Rest Breaks 0     MPH 2.1     METS 2.02     RPE 13     Perceived Dyspnea  2     VO2 Peak 7.06     Symptoms Yes (comment)     Comments SOB, chest pain with arm pain 4/10     Resting HR 58 bpm     Resting BP 128/64     Resting Oxygen Saturation  98 %     Exercise Oxygen Saturation  during 6 min walk 98 %     Max Ex. HR 75 bpm     Max Ex. BP 128/60     2 Minute Post BP 124/60              Oxygen Initial Assessment:   Oxygen Re-Evaluation:   Oxygen Discharge (Final Oxygen Re-Evaluation):   Initial Exercise Prescription:  Initial Exercise Prescription - 02/05/22 1200       Date of Initial Exercise RX and Referring Provider   Date 02/05/22    Referring Provider [name] MD      Oxygen   Maintain Oxygen Saturation 88% or higher      Treadmill   MPH 1.8    Grade 0.5    Minutes 15    METs 2.5      Recumbant Bike   Level 1    RPM 50     Watts 10    Minutes 15    METs 2      REL-XR   Level 2    Speed 50    Minutes 15    METs 2      Track   Laps 27    Minutes 15    METs 2.47      Prescription Details   Frequency (times per week) 2    Duration Progress to 30 minutes of continuous aerobic without signs/symptoms of physical distress      Intensity   THRR 40-80% of Max Heartrate 92-126    Ratings of Perceived Exertion 11-13    Perceived Dyspnea 0-4      Progression   Progression Continue to progress workloads to maintain intensity without signs/symptoms of physical distress.      Resistance Training   Training Prescription Yes    Weight 4 lb    Reps 10-15             Perform Capillary Blood Glucose checks as needed.  Exercise Prescription Changes:   Exercise Prescription Changes     Row Name 02/05/22 1200 02/11/22 1500 02/25/22 1300 04/08/22 1500 04/23/22 1100     Response to Exercise   Blood Pressure (Admit) 128/64 102/58 110/58 126/64 118/62   Blood Pressure (Exercise) 128/60 132/70 142/62 144/70 138/66   Blood Pressure (Exit) 124/60 118/64 102/58 116/58 122/56   Heart Rate (Admit) 58 bpm 52 bpm 67 bpm 64 bpm 58 bpm   Heart Rate (Exercise) 75 bpm 76 bpm 96 bpm 74 bpm 78 bpm   Heart Rate (Exit) 66 bpm 68 bpm 66 bpm 56 bpm 55 bpm   Oxygen Saturation (Admit) 98 % -- -- -- --   Oxygen Saturation (Exercise) 98 % -- -- -- --   Rating of Perceived Exertion (Exercise) 13 13 13 13 $ 14  Perceived Dyspnea (Exercise) 2 -- -- -- --   Symptoms SOB, chest/arm pain 4/10 none none none none   Comments walk test results 1st full day of exercise -- -- --   Duration -- Progress to 30 minutes of  aerobic without signs/symptoms of physical distress Continue with 30 min of aerobic exercise without signs/symptoms of physical distress. Continue with 30 min of aerobic exercise without signs/symptoms of physical distress. Continue with 30 min of aerobic exercise without signs/symptoms of physical distress.   Intensity  -- THRR unchanged THRR unchanged THRR unchanged THRR unchanged     Progression   Progression -- Continue to progress workloads to maintain intensity without signs/symptoms of physical distress. Continue to progress workloads to maintain intensity without signs/symptoms of physical distress. Continue to progress workloads to maintain intensity without signs/symptoms of physical distress. Continue to progress workloads to maintain intensity without signs/symptoms of physical distress.   Average METs -- 2.9 3.1 2.83 3.01     Resistance Training   Training Prescription -- Yes Yes Yes Yes   Weight -- 4 lb 4 lb 4 lb 4 lb   Reps -- 10-15 10-15 10-15 10-15     Interval Training   Interval Training -- No No No No     Treadmill   MPH -- 2 2.3 2.1 --   Grade -- 0.5 0.5 2 --   Minutes -- 15 15 15 $ --   METs -- 2.64 2.92 3.19 --     Recumbant Bike   Level -- -- 3 4 --   Watts -- -- 23 23 --   Minutes -- -- 15 15 --   METs -- -- 2.91 2.9 --     REL-XR   Level -- 4 3 5 4   $ Minutes -- 15 15 15 15   $ METs -- -- 4.5 4.2 4     T5 Nustep   Level -- -- -- -- 3   Minutes -- -- -- -- 15   METs -- -- -- -- 2.4     Track   Laps -- -- -- 27 30   Minutes -- -- -- 15 15   METs -- -- -- 2.47 2.63     Oxygen   Maintain Oxygen Saturation -- -- 88% or higher 88% or higher 88% or higher            Exercise Comments:   Exercise Comments     Row Name 02/06/22 0907 02/18/22 0829         Exercise Comments First full day of exercise!  Patient was oriented to gym and equipment including functions, settings, policies, and procedures.  Patient's individual exercise prescription and treatment plan were reviewed.  All starting workloads were established based on the results of the 6 minute walk test done at initial orientation visit.  The plan for exercise progression was also introduced and progression will be customized based on patient's performance and goals. Fell in yard while doing yardwork, few  scratches,no other injury               Exercise Goals and Review:   Exercise Goals     Row Name 02/05/22 1220             Exercise Goals   Increase Physical Activity Yes       Intervention Provide advice, education, support and counseling about physical activity/exercise needs.;Develop an individualized exercise prescription for aerobic and resistive training based on initial evaluation findings, risk stratification, comorbidities  and participant's personal goals.       Expected Outcomes Short Term: Attend rehab on a regular basis to increase amount of physical activity.;Long Term: Exercising regularly at least 3-5 days a week.;Long Term: Add in home exercise to make exercise part of routine and to increase amount of physical activity.       Increase Strength and Stamina Yes       Intervention Provide advice, education, support and counseling about physical activity/exercise needs.;Develop an individualized exercise prescription for aerobic and resistive training based on initial evaluation findings, risk stratification, comorbidities and participant's personal goals.       Expected Outcomes Short Term: Increase workloads from initial exercise prescription for resistance, speed, and METs.;Short Term: Perform resistance training exercises routinely during rehab and add in resistance training at home;Long Term: Improve cardiorespiratory fitness, muscular endurance and strength as measured by increased METs and functional capacity (6MWT)       Able to understand and use rate of perceived exertion (RPE) scale Yes       Intervention Provide education and explanation on how to use RPE scale       Expected Outcomes Short Term: Able to use RPE daily in rehab to express subjective intensity level;Long Term:  Able to use RPE to guide intensity level when exercising independently       Able to understand and use Dyspnea scale Yes       Intervention Provide education and explanation on how to use  Dyspnea scale       Expected Outcomes Long Term: Able to use Dyspnea scale to guide intensity level when exercising independently;Short Term: Able to use Dyspnea scale daily in rehab to express subjective sense of shortness of breath during exertion       Knowledge and understanding of Target Heart Rate Range (THRR) Yes       Intervention Provide education and explanation of THRR including how the numbers were predicted and where they are located for reference       Expected Outcomes Long Term: Able to use THRR to govern intensity when exercising independently;Short Term: Able to state/look up THRR;Short Term: Able to use daily as guideline for intensity in rehab       Able to check pulse independently Yes       Intervention Provide education and demonstration on how to check pulse in carotid and radial arteries.;Review the importance of being able to check your own pulse for safety during independent exercise       Expected Outcomes Short Term: Able to explain why pulse checking is important during independent exercise;Long Term: Able to check pulse independently and accurately       Understanding of Exercise Prescription Yes       Intervention Provide education, explanation, and written materials on patient's individual exercise prescription       Expected Outcomes Short Term: Able to explain program exercise prescription;Long Term: Able to explain home exercise prescription to exercise independently                Exercise Goals Re-Evaluation :  Exercise Goals Re-Evaluation     Row Name 02/06/22 [id number] 02/11/22 1544 02/25/22 1340 03/10/22 1603 03/26/22 1313     Exercise Goal Re-Evaluation   Exercise Goals Review Able to understand and use rate of perceived exertion (RPE) scale;Able to understand and use Dyspnea scale;Knowledge and understanding of Target Heart Rate Range (THRR);Understanding of Exercise Prescription Increase Physical Activity;Increase Strength and Stamina;Understanding of  Exercise Prescription Increase Physical  Activity;Increase Strength and Stamina;Understanding of Exercise Prescription Increase Physical Activity;Increase Strength and Stamina;Understanding of Exercise Prescription Increase Physical Activity;Increase Strength and Stamina;Understanding of Exercise Prescription   Comments Reviewed RPE scale, THR and program prescription with pt today.  Pt voiced understanding and was given a copy of goals to take home. Husam is off to a good start for the first session he has been here. He was able to do his initial exercise prescription plus more. He increased his level on the XR to 4 and also increased his speed to 2.0 mph. We will continue to monitor as he progresses in the program. Veeraj is doing well in rehab. He recently increased his overall average MET level to 3.1 METs. He also has been walking on the treadmill at a speed of 2.3 mph and an incline of 0.5%. He has tolerated 4 lb hand weights for resistance training as well. We will continue to monitor his progress in the program. Gurshawn has not been to rehab due to having skin issues that were not dry and contained. He was cleared to come back as long as his lessions are dry and crusted, patient aware. We will continue to monitor patient when he resumes back in rehab. Yisrael has not been to rehab due to having skin issues that were not dry and contained. He was cleared to come back as long as his lessions are dry and crusted, patient aware. We will continue to monitor patient when he resumes back in rehab.   Expected Outcomes Short: Use RPE daily to regulate intensity.  Long: Follow program prescription in THR. Short: Continue to attend rehab and working with machines Long: Continue to build up overall strength and stamina Short: Continue to increase workload on the treadmill. Long: Continue to increase strength and stamina. Short: Return back to rehab with good attendance Long: Continue to increase overall MET level Short:  Return back to rehab with good attendance Long: Continue to increase overall MET level    Row Name 04/08/22 0756 04/08/22 1510 04/23/22 1114 05/06/22 1347 05/20/22 1403     Exercise Goal Re-Evaluation   Exercise Goals Review Increase Physical Activity;Increase Strength and Stamina;Understanding of Exercise Prescription Increase Physical Activity;Increase Strength and Stamina;Understanding of Exercise Prescription Increase Physical Activity;Increase Strength and Stamina;Understanding of Exercise Prescription Increase Physical Activity;Increase Strength and Stamina;Understanding of Exercise Prescription Increase Physical Activity;Increase Strength and Stamina;Understanding of Exercise Prescription   Comments Eker is doing well in rehab.  He is going to [facility] on his off days and uses the stepper.  He is also using the machines for upper body weights.  We talked about using lower body as well. He is also walking some.  He is still not where he was prior to surgery but it is starting to get better.  He is also starting to do more weights/yoga at home.  He is able to do more around the house as well. Wiatt has gotten back into a routine with exercise since he was out for several weeks. He was able to complete a full 27 laps on the track and also increased on both the recumbent bike to level 4 and the XR to level 5!  He is not quite hitting his THR but RPEs have been appropriate. Will continue to monitor. Jaquil is doing well in rehab. He has kept his average MET level above 3 METs. He also was able to increase up to 30 laps on the track. He has also done well with 4 lb hand weights  and may benefit from trying 5 lb. We will continue to monitor his progress in the program. Patient has not been here since last review. He had a new pacemaker placed. Staff has tried to follow up with patient but has not received a response back yet from the patient. We will continue to attempt calls to receive any updates. Patient has  not been here since last review. He had a new pacemaker placed. Staff has tried to follow up with patient but has not received a response back yet from the patient. We will continue to attempt calls to receive any updates.   Expected Outcomes Short: Continue to go to [facility] on off days Long: Continue to improve stamina Short: Continue to increase laps on track Long: Continue to increase overall MET level Short: Try 5 lb hand weights. Long: Continue to improve strength and stamina. Short: Return back to rehab once cleared Long: Graduate from [employer]: Return back to rehab once cleared Long: Graduate from [employer] Name 06/03/22 1424             Exercise Goal Re-Evaluation   Exercise Goals Review Increase Physical Activity;Increase Strength and Stamina;Understanding of Exercise Prescription       Comments Patient has remained out of the program. We last spoke with him last week where he debated on coming back. He is supposed to call us this week to give [place] his final answer if he is returning or not.       Expected Outcomes Short: Update staff on response back Long: Graduate                Discharge Exercise Prescription (Final Exercise Prescription Changes):  Exercise Prescription Changes - 04/23/22 1100       Response to Exercise   Blood Pressure (Admit) 118/62    Blood Pressure (Exercise) 138/66    Blood Pressure (Exit) 122/56    Heart Rate (Admit) 58 bpm    Heart Rate (Exercise) 78 bpm    Heart Rate (Exit) 55 bpm    Rating of Perceived Exertion (Exercise) 14    Symptoms none    Duration Continue with 30 min of aerobic exercise without signs/symptoms of physical distress.    Intensity THRR unchanged      Progression   Progression Continue to progress workloads to maintain intensity without signs/symptoms of physical distress.    Average METs 3.01      Resistance Training   Training Prescription Yes    Weight 4 lb    Reps 10-15      Interval Training    Interval Training No      REL-XR   Level 4    Minutes 15    METs 4      T5 Nustep   Level 3    Minutes 15    METs 2.4      Track   Laps 30    Minutes 15    METs 2.63      Oxygen   Maintain Oxygen Saturation 88% or higher             Nutrition:  Target Goals: Understanding of nutrition guidelines, daily intake of sodium <1513m, cholesterol <2021m calories 30% from fat and 7% or less from saturated fats, daily to have 5 or more servings of fruits and vegetables.  Education: All About Nutrition: -Group instruction provided by verbal, written material, interactive activities, discussions, models, and posters to present general guidelines for heart healthy  nutrition including fat, fiber, MyPlate, the role of sodium in heart healthy nutrition, utilization of the nutrition label, and utilization of this knowledge for meal planning. Follow up email sent as well. Written material given at graduation. Flowsheet Row Cardiac Rehab from 04/02/2022 in [facility] Cardiac [facility] Pulmonary Rehab  Date 03/27/22  Educator [facility]  Instruction Review Code 1- Verbalizes Understanding  [part II]       Biometrics:  Pre Biometrics - 02/05/22 1221       Pre Biometrics   Height 5' 8"$  (1.727 m)    Weight 172 lb 11.2 oz (78.3 kg)    Waist Circumference 38 inches    Hip Circumference 37.25 inches    Waist to Hip Ratio 1.02 %    BMI (Calculated) 26.26    Single Leg Stand 2.9 seconds              Nutrition Therapy Plan and Nutrition Goals:  Nutrition Therapy & Goals - 02/05/22 0859       Nutrition Therapy   Diet Heart healthy, low Na, CKD stg 4 MNT    Drug/Food Interactions Purine/Gout    Protein (specify units) 70-75g    Fiber 28 grams    Whole Grain Foods 3 servings   or less depending on his potassium   Saturated Fats 15 max. grams    Fruits and Vegetables 8 servings/day    Sodium 1.5 grams      Personal Nutrition Goals   Nutrition Goal ST: inclde, but limit high potassium/phosphorus  containing foods. Review paperwork. identify high sources of sodium in the foods he eats regularly.  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    Comments 77 y.o. M admitted to cardiac rehab s/p PTCA. PMHx includes CAD, chronic CHF, CKD stage 4, HTN, HLD, AFib, PVD, CHB with pacemaker, arthritis, gout, lupus, MI (2018), skin cancer. PSHx includes CABG (1988, 1996), stent (2021). Relevant medications includes furosemide, prednisone, bactrim. Recent labs: Basic metabolic panel: BUN 76, creatinine 2.73, GFR 23, BUN/creatinine ratio ratio 28. Kouta reports no gout flare-ups in decades, provided handout for his reference if he were to have a flare up again. He has been working with a neprhologist to manage his CKD stg 4 which he reports having since 1992. Discussed considerations for CKD including sodium, potassium, and phosphorus; his last blood result showed his potassium was on the high end of the normal range - recommended limiting high sources of potassium for the day, provided list. Rudolfo is nervous today as he has been having issues with edema and feels it began when he started the prednisone which his MD is tapering him off of - encouraged him to connect with his MD regarding this as the increased lasix has not been taking off his fluid. We discussed sodium recommendations, food label reading, and sources that are higher in sodium. He reports being hungrier than usual due to the prednisone so he has been having  B: bacon 3x/week, fruits (blueberries, bananas, and dates) with a fiber rich cereal like grape nuts and  2% milk . He will usually have some OJ as well as coffee. S: english muffin with PB and jelly L: home made tuna or chicken salad sandwich with lettuce D: Chicken, fish. Steamed vegetables ( he buys vegetables fresh). He does not usually have them with any carbohydrates aside from some rice. Drinks: sipping water  during the day to help reduce swelling. He reports  using greek seasoning as well as another seasoning blend. He uses olive oil and some salt from a grinder. He reports using whole grain bread most of the time. 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 He feels that his sodium may be coming from the seasoning blends so he will check those when he gets home; suggested that if he uses it often, he could make his own seasoning blend without sodium - Ricky reported that he would rather look for one lower in sodium.      Intervention Plan   Intervention Prescribe, educate and counsel regarding individualized specific dietary modifications aiming towards targeted core components such as weight, hypertension, lipid management, diabetes, heart failure and other comorbidities.    Expected Outcomes Short Term Goal: Understand basic principles of dietary content, such as calories, fat, sodium, cholesterol and nutrients.;Long Term Goal: Adherence to prescribed nutrition plan.;Short Term Goal: A plan has been developed with personal nutrition goals set during dietitian appointment.             Nutrition Assessments:  MEDIFICTS Score Key: ?70 Need to make dietary changes  40-70 Heart Healthy Diet ? 40 Therapeutic Level Cholesterol Diet  Flowsheet Row Cardiac Rehab from 02/05/2022 in [facility] Cardiac and Pulmonary Rehab  Picture Your Plate Total Score on Admission 72      Picture Your Plate Scores: [id number] Unhealthy dietary pattern with much room for improvement. 41-50 Dietary pattern unlikely to meet recommendations for good health and room for improvement. 51-60 More healthful dietary pattern, with some room for improvement.  >60 Healthy dietary pattern, although there may be some specific behaviors that could be improved.    Nutrition Goals Re-Evaluation:  Nutrition Goals Re-Evaluation     [place] Name 04/08/22 0802             Goals   Nutrition Goal ST: inclde, but limit high  potassium/phosphorus containing foods. Review paperwork. identify high sources of sodium in the foods he eats regularly.  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       Comment Jahsir is doing well in rehab.  He has had some family move in with him that are helping him cook.  They are all trying to aim for healthy cooking and helping him get more variety.  They are using broiler and Briane is able to try vegetables in new ways.  He is still trying to cut back on sodium and sugar.       Expected Outcome Short: Continue with new variety Long: Continue to focus on heart healthy eating                Nutrition Goals Discharge (Final Nutrition Goals Re-Evaluation):  Nutrition Goals Re-Evaluation - 04/08/22 0802       Goals   Nutrition Goal ST: inclde, but limit high potassium/phosphorus containing foods. Review paperwork. identify high sources of sodium in the foods he eats regularly.  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    Comment Read is doing well in rehab.  He has had some family move in with him that are helping him cook.  They are all trying to aim for healthy cooking and helping him get more variety.  They are using broiler and Jamis is able to try vegetables in new ways.  He is still trying to cut back on sodium  and sugar.    Expected Outcome Short: Continue with new variety Long: Continue to focus on heart healthy eating             Psychosocial: Target Goals: Acknowledge presence or absence of significant depression and/or stress, maximize coping skills, provide positive support system. Participant is able to verbalize types and ability to use techniques and skills needed for reducing stress and depression.   Education: Stress, Anxiety, and Depression - Group verbal and visual presentation to define  topics covered.  Reviews how body is impacted by stress, anxiety, and depression.  Also discusses healthy ways to reduce stress and to treat/manage anxiety and depression.  Written material given at graduation. Flowsheet Row Cardiac Rehab from 04/02/2022 in [facility] Cardiac and Pulmonary Rehab  Date 02/20/22  Educator [facility]  Instruction Review Code 1- [employer] Understanding       Education: Sleep Hygiene -Provides group verbal and written instruction about how sleep can affect your health.  Define sleep hygiene, discuss sleep cycles and impact of sleep habits. Review good sleep hygiene tips.    Initial Review & Psychosocial Screening:  Initial Psych Review & Screening - 01/29/22 1426       Initial Review   Current issues with None Identified      Family Dynamics   Good Support System? Yes    Comments He states that he does not have many people for support. He has a few kids and one that is close by.      Barriers   Psychosocial barriers to participate in program There are no identifiable barriers or psychosocial needs.;The patient should benefit from training in stress management and relaxation.      Screening Interventions   Interventions To provide support and resources with identified psychosocial needs;Encouraged to exercise;Provide feedback about the scores to participant    Expected Outcomes Short Term goal: Utilizing psychosocial counselor, staff and physician to assist with identification of specific Stressors or current issues interfering with healing process. Setting desired goal for each stressor or current issue identified.;Long Term Goal: Stressors or current issues are controlled or eliminated.;Short Term goal: Identification and review with participant of any Quality of Life or Depression concerns found by scoring the questionnaire.;Long Term goal: The participant improves quality of Life and PHQ9 Scores as seen by post scores and/or verbalization of changes              Quality of Life Scores:   Quality of Life - 02/05/22 1221       Quality of Life   Select Quality of Life      Quality of Life Scores   Health/Function Pre 14.37 %    Socioeconomic Pre 21.75 %    Psych/Spiritual Pre 21.21 %    Family Pre 24.3 %    GLOBAL Pre 18.14 %            Scores of 19 and below usually indicate a poorer quality of life in these areas.  A difference of  2-3 points is a clinically meaningful difference.  A difference of 2-3 points in the total score of the Quality of Life Index has been associated with significant improvement in overall quality of life, self-image, physical symptoms, and general health in studies assessing change in quality of life.  PHQ-9: Review Flowsheet       03/27/2022 02/05/2022 08/03/2017 07/01/2017  Depression screen PHQ 2/9  Decreased Interest 2 0 0 0  Down, Depressed, Hopeless 1 0 0 0  PHQ -  2 Score 3 0 0 0  Altered sleeping 3 3 - -  Tired, decreased energy 3 3 - -  Change in appetite 0 0 - -  Feeling bad or failure about yourself  1 0 - -  Trouble concentrating 0 0 - -  Moving slowly or fidgety/restless 0 0 - -  Suicidal thoughts 0 0 - -  PHQ-9 Score 10 6 - -  Difficult doing work/chores Somewhat difficult Somewhat difficult - -   Interpretation of Total Score  Total Score Depression Severity:  1-4 = Minimal depression, 5-9 = Mild depression, 10-14 = Moderate depression, 15-19 = Moderately severe depression, 20-27 = Severe depression   Psychosocial Evaluation and Intervention:  Psychosocial Evaluation - 01/29/22 1427       Psychosocial Evaluation & Interventions   Interventions Encouraged to exercise with the program and follow exercise prescription;Relaxation education;Stress management education    Comments He states that he does not have many people for support. He has a few kids and one that is close by.    Expected Outcomes Short: Start [facility] to help with mood. Long: Maintain a healthy mental state     Continue Psychosocial Services  Follow up required by staff             Psychosocial Re-Evaluation:  Psychosocial Re-Evaluation     Row Name 03/27/22 0806 04/08/22 0759           Psychosocial Re-Evaluation   Current issues with Current Stress Concerns;Current Depression Current Stress Concerns;Current Depression      Comments Reviewed patient health questionnaire (PHQ-9) with patient for follow up. Previously, patients score indicated signs/symptoms of depression.  Reviewed to see if patient is improving symptom wise while in program.  Score declined and patient states that it is because they have been dealing with multiple ongoing health issues. He had missed the last month due to Shingles. Harmandeep is doing well in rehab.  He tries not to get too stressed out.  His PHQ did decline, but he is trying to have a better attitude and to look for the good in things.  He is planning to go to [place] for the holidays.  He is starting to come off his prednisone and is starting to sleep better.  Prior to that, he was only getting about 4 hours a night.  He also has frequently trips to bathroom. He is looking forward to continue to decrease his prednisone dose to help with sleep.      Expected Outcomes Short; Get back to regular exercise  routine  Long; Continue to exericse for mental boost Short: Continue to work on sleep Long: Enjoy trip to [place]      Interventions Encouraged to attend Cardiac Rehabilitation for the exercise;Stress management education Encouraged to attend Cardiac Rehabilitation for the exercise;Stress management education      Continue Psychosocial Services  No Follow up required Follow up required by staff               Psychosocial Discharge (Final Psychosocial Re-Evaluation):  Psychosocial Re-Evaluation - 04/08/22 0759       Psychosocial Re-Evaluation   Current issues with Current Stress Concerns;Current Depression    Comments Gaelan is doing well in rehab.  He tries  not to get too stressed out.  His PHQ did decline, but he is trying to have a better attitude and to look for the good in things.  He is planning to go to [place] for the holidays.  He is starting to come off his prednisone and is starting to sleep better.  Prior to that, he was only getting about 4 hours a night.  He also has frequently trips to bathroom. He is looking forward to continue to decrease his prednisone dose to help with sleep.    Expected Outcomes Short: Continue to work on sleep Long: Enjoy trip to [place]    Interventions Encouraged to attend Cardiac Rehabilitation for the exercise;Stress management education    Continue Psychosocial Services  Follow up required by staff             Vocational Rehabilitation: Provide vocational rehab assistance to qualifying candidates.   Vocational Rehab Evaluation & Intervention:   Education: Education Goals: Education classes will be provided on a variety of topics geared toward better understanding of heart health and risk factor modification. Participant will state understanding/return demonstration of topics presented as noted by education test scores.  Learning Barriers/Preferences:  Learning Barriers/Preferences - 01/29/22 1425       Learning Barriers/Preferences   Learning Barriers Sight    Learning Preferences Skilled Demonstration;Video             General Cardiac Education Topics:  AED/CPR: - Group verbal and written instruction with the use of models to demonstrate the basic use of the AED with the basic ABC's of resuscitation.   Anatomy and Cardiac Procedures: - Group verbal and visual presentation and models provide information about basic cardiac anatomy and function. Reviews the testing methods done to diagnose heart disease and the outcomes of the test results. Describes the treatment choices: Medical Management, Angioplasty, or Coronary Bypass Surgery for treating various heart conditions including  Myocardial Infarction, Angina, Valve Disease, and Cardiac Arrhythmias.  Written material given at graduation. Flowsheet Row Cardiac Rehab from 04/02/2022 in [facility] Cardiac and Pulmonary Rehab  Education need identified 02/05/22  Date 04/02/22  Educator [place]  Instruction Review Code 1- Verbalizes Understanding       Medication Safety: - Group verbal and visual instruction to review commonly prescribed medications for heart and lung disease. Reviews the medication, class of the drug, and side effects. Includes the steps to properly store meds and maintain the prescription regimen.  Written material given at graduation.   Intimacy: - Group verbal instruction through game format to discuss how heart and lung disease can affect sexual intimacy. Written material given at graduation..   Know Your Numbers and Heart Failure: - Group verbal and visual instruction to discuss disease risk factors for cardiac and pulmonary disease and treatment options.  Reviews associated critical values for Overweight/Obesity, Hypertension, Cholesterol, and Diabetes.  Discusses basics of heart failure: signs/symptoms and treatments.  Introduces Heart Failure Zone chart for action plan for heart failure.  Written material given at graduation. Flowsheet Row Cardiac Rehab from 04/02/2022 in [facility] Cardiac and Pulmonary Rehab  Date 02/06/22  Educator [facility]  Instruction Review Code 1- Verbalizes Understanding       Infection Prevention: - Provides verbal and written material to individual with discussion of infection control including proper hand washing and proper equipment cleaning during exercise session. Flowsheet Row Cardiac Rehab from 04/02/2022 in [facility] Cardiac and Pulmonary Rehab  Date 01/29/22  Educator [facility]  Instruction Review Code 1- Verbalizes Understanding       Falls Prevention: - Provides verbal and written material to individual with discussion of falls prevention and safety. Flowsheet Row Cardiac Rehab from  04/02/2022 in [facility] Cardiac and Pulmonary Rehab  Date 01/29/22  Educator [facility]  Instruction Review Code 1- Verbalizes Understanding       Other: -Provides group and verbal instruction on various topics (see comments)   Knowledge Questionnaire Score:   Core Components/Risk Factors/Patient Goals at Admission:  Personal Goals and Risk Factors at Admission - 02/05/22 1224       Core Components/Risk Factors/Patient Goals on Admission    Weight Management Yes;Obesity    Intervention Weight Management: Develop a combined nutrition and exercise program designed to reach desired caloric intake, while maintaining appropriate intake of nutrient and fiber, sodium and fats, and appropriate energy expenditure required for the weight goal.;Weight Management: Provide education and appropriate resources to help participant work on and attain dietary goals.;Weight Management/Obesity: Establish reasonable short term and long term weight goals.;Obesity: Provide education and appropriate resources to help participant work on and attain dietary goals.    Admit Weight 172 lb 11.2 oz (78.3 kg)    Goal Weight: Short Term 168 lb (76.2 kg)    Goal Weight: Long Term 160 lb (72.6 kg)    Expected Outcomes Short Term: Continue to assess and modify interventions until short term weight is achieved;Long Term: Adherence to nutrition and physical activity/exercise program aimed toward attainment of established weight goal;Weight Loss: Understanding of general recommendations for a balanced deficit meal plan, which promotes 1-2 lb weight loss per week and includes a negative energy balance of [phone] kcal/d;Understanding recommendations for meals to include 15-35% energy as protein, 25-35% energy from fat, 35-60% energy from carbohydrates, less than 246m of dietary cholesterol, 20-35 gm of total fiber daily;Understanding of distribution of calorie intake throughout the day with the consumption of 4-5 meals/snacks    Heart  Failure Yes    Intervention Provide a combined exercise and nutrition program that is supplemented with education, support and counseling about heart failure. Directed toward relieving symptoms such as shortness of breath, decreased exercise tolerance, and extremity edema.    Expected Outcomes Improve functional capacity of life;Short term: Attendance in program 2-3 days a week with increased exercise capacity. Reported lower sodium intake. Reported increased fruit and vegetable intake. Reports medication compliance.;Short term: Daily weights obtained and reported for increase. Utilizing diuretic protocols set by physician.;Long term: Adoption of self-care skills and reduction of barriers for early signs and symptoms recognition and intervention leading to self-care maintenance.    Hypertension Yes    Intervention Provide education on lifestyle modifcations including regular physical activity/exercise, weight management, moderate sodium restriction and increased consumption of fresh fruit, vegetables, and low fat dairy, alcohol moderation, and smoking cessation.;Monitor prescription use compliance.    Expected Outcomes Long Term: Maintenance of blood pressure at goal levels.;Short Term: Continued assessment and intervention until BP is < 140/932mHG in hypertensive participants. < 130/8048mG in hypertensive participants with diabetes, heart failure or chronic kidney disease.    Lipids Yes    Intervention Provide education and support for participant on nutrition & aerobic/resistive exercise along with prescribed medications to achieve LDL <82m40mDL >40mg63m Expected Outcomes Short Term: Participant states understanding of desired cholesterol values and is compliant with medications prescribed. Participant is following exercise prescription and nutrition guidelines.;Long Term: Cholesterol controlled with medications as prescribed, with individualized exercise RX and with personalized nutrition plan. Value  goals: LDL < 82mg,82m > 40 mg.             Education:Diabetes - Individual verbal and written instruction to review signs/symptoms of diabetes, desired ranges of glucose level fasting, after meals and with exercise. Acknowledge that  pre and post exercise glucose checks will be done for 3 sessions at entry of program.   Core Components/Risk Factors/Patient Goals Review:   Goals and Risk Factor Review     Row Name 04/08/22 0804             Core Components/Risk Factors/Patient Goals Review   Personal Goals Review Weight Management/Obesity;Hypertension;Lipids       Review Neji is doing well in rehab.  Overall, his weight is trending down.  He is down under 170 and he wants to get under 160 lb.  His pressures are doing well in rehab.  He does not check them at home.  He does monitor his weight and saturations at home as pressures seem to be stable.       Expected Outcomes Short: Continue to work on weight loss long; Conitnue to monitor risk factors.                Core Components/Risk Factors/Patient Goals at Discharge (Final Review):   Goals and Risk Factor Review - 04/08/22 0804       Core Components/Risk Factors/Patient Goals Review   Personal Goals Review Weight Management/Obesity;Hypertension;Lipids    Review Demani is doing well in rehab.  Overall, his weight is trending down.  He is down under 170 and he wants to get under 160 lb.  His pressures are doing well in rehab.  He does not check them at home.  He does monitor his weight and saturations at home as pressures seem to be stable.    Expected Outcomes Short: Continue to work on weight loss long; Conitnue to monitor risk factors.             ITP Comments:  ITP Comments     Row Name 01/29/22 1422 02/05/22 1215 02/06/22 0906 02/18/22 0828 02/26/22 0955   ITP Comments Virtual Visit completed. Patient informed on EP and RD appointment and 6 Minute walk test. Patient also informed of patient health questionnaires  on My Chart. Patient Verbalizes understanding. Visit diagnosis can be found in [facility] 01/09/2022. Completed 6MWT and gym orientation. Initial ITP created and sent for review to Dr. Emily Filbert, Medical Director. First full day of exercise!  Patient was oriented to gym and equipment including functions, settings, policies, and procedures.  Patient's individual exercise prescription and treatment plan were reviewed.  All starting workloads were established based on the results of the 6 minute walk test done at initial orientation visit.  The plan for exercise progression was also introduced and progression will be customized based on patient's performance and goals. Fell in yard while doing yardwork, few scratches,no other injury 30 Day review completed. Medical Director ITP review done, changes made as directed, and signed approval by Medical Director.    [place] Name 03/11/22 1517 03/24/22 1358 03/25/22 1610 03/26/22 0826 04/23/22 1056   ITP Comments Patient called to let us know he will not be in this week as he had a new diagnosis (could not say what), unclear, but has doctor appts this week to follow up. Cardiologist appt on 11/16. Patient appts will be taken out this week and will call us on Thursday to give [place] a better explanation on what is going on. Provider notes from today, 11/14 are not complete yet. Spoke with patient, saw his cardiologist for a "black out" period in which he was told he was dehydrated. States his skin is all cleared up. No other issues. Patient to return thursday 11/30. Unableto complete goals at  this time due to lack of attendance with Dr. Kendrick Fries and skin issues that were not safe for rehab. Patient is better and call cleared up Patient out since 10/31. 30 Day review completed. Medical Director ITP review done, changes made as directed, and signed approval by Medical Director. 30 Day review completed. Medical Director ITP review done, changes made as directed, and signed approval by Medical  Director.    [place] Name 05/13/22 0734 05/21/22 1021 05/26/22 1444 06/18/22 1439     ITP Comments Have not heard back from patient, however, patient is scheduled to have a heart cath on 1/24. We hope to continue to make contact with patient and will wait to see if there is any intervention involved. Has not attended rehab since 12/21. 30 Day review completed. Medical Director ITP review done, changes made as directed, and signed approval by Medical Director.   remains out Spoke with patient as cardiac rehab received a new referral on patient. Patient has currently been on medical hold. Patient stated he doesn't think he plans on coming back but would like to see his doctors this week to discuss. Told him we will remain him on medical hold for another week and he will call us back and let us know what he decides. Leopoldo has been out since his PPM was placed.  He was indecisive about returning to rehab.  He was sent a letter and has not returned our contact.  We will discharge him at this time.             Comments: Discharge ITP

## 2022-06-18 NOTE — Telephone Encounter (Signed)
Pt called the office stating that he is scheduled to see cardiology 2/26 and was told by cardiology that they wanted him to have a CT chest prior to that visit. Pt is wanting to know if Dr. Silas Flood would be fine with having the order placed for pt.  Dr. Silas Flood, please advise if you are okay with [place] placing order for pt to have a CT chest which will need to be scheduled ASAP prior to cardiology visit. Pt is wanting [place] to place this so it could be within the system.

## 2022-06-18 NOTE — Telephone Encounter (Signed)
Stat order has been placed for Chest CT. Pt has been aware. Nothing further needed.

## 2022-06-19 ENCOUNTER — Ambulatory Visit
Admission: RE | Admit: 2022-06-19 | Discharge: 2022-06-19 | Disposition: A | Discharge status: Home / Self Care | Payer: Medicare HMO | Source: Ambulatory Visit | Attending: Pulmonary Disease | Admitting: Pulmonary Disease

## 2022-06-19 DIAGNOSIS — J479 Bronchiectasis, uncomplicated: Secondary | ICD-10-CM | POA: Diagnosis not present

## 2022-06-19 DIAGNOSIS — I358 Other nonrheumatic aortic valve disorders: Secondary | ICD-10-CM | POA: Diagnosis not present

## 2022-06-19 DIAGNOSIS — I2510 Atherosclerotic heart disease of native coronary artery without angina pectoris: Secondary | ICD-10-CM | POA: Diagnosis not present

## 2022-06-19 DIAGNOSIS — R053 Chronic cough: Secondary | ICD-10-CM | POA: Diagnosis not present

## 2022-06-19 DIAGNOSIS — J324 Chronic pansinusitis: Secondary | ICD-10-CM | POA: Insufficient documentation

## 2022-06-19 DIAGNOSIS — T462X1D Poisoning by other antidysrhythmic drugs, accidental (unintentional), subsequent encounter: Secondary | ICD-10-CM | POA: Insufficient documentation

## 2022-06-19 DIAGNOSIS — R918 Other nonspecific abnormal finding of lung field: Secondary | ICD-10-CM | POA: Diagnosis not present

## 2022-06-19 NOTE — Progress Notes (Signed)
CT remains improved from 12/2021, similar appearance to last image 02/2022 - reassuring.

## 2022-06-22 NOTE — Progress Notes (Unsigned)
Cardiology Office Note:    Date:  06/23/2022   ID:  Kyle Avila, DOB 1945-10-11, MRN [id number]  PCP:  [name], [place] [facility] Cardiologist:  [name], MD Cardiology APP:  Liliane Shi, PA-C  Electrophysiologist:  [name], MD     Referring MD: Nelva Bush, MD   Patient Profile: Coronary artery disease S/p CABG in 1988; redo in 1996 S/p multiple PCI procedures (DES x 2 to LCx in 2009, DES to S-PDA in 2016, DES x 2 to S-PDA in 2018, DES to RPDA and cutting balloon POBA to S-RPDA (ISR) 6/21; POBA to both stented segments of S-PDA in 07/2021) [place] 05/21/2022: LM 100, LAD ostial 100, mid 58 (at anastomosis), distal 55 (after anastomosis); RI ostial 100; LM to LCx stent patent, OM1 100; RCA ostial 100 CTO, RPDA stent 95 ISR; SVG-RPDA stent patent with 30 ISR, 57 ISR; SVG-OM1 100 CTO; LIMA-LAD patent>> med Rx (+ Ranexa) HFmrEF (heart failure with mildly reduced ejection fraction)  Ischemic CM TTE 02/26/2022: EF 45-50, mildly reduced RVSF, RVSP 33.9, mild to moderate MR, AV sclerosis, RAP 3   Persistent atrial fibrillation  S/p DCCV 06/2021 >> ERAF Amiodarone DC'd (Lung toxicity; failed DCCV) Completed course of Prednisone  Dr. Silas Flood ([place]) Mild to mod mitral regurgitation  Carotid artery disease S/p R CEA Carotid US 04/18/2022: Patent R CEA; L ICA 1-39; R VA stenosis CHB, s/p pacemaker S/p Upgrade to CRT 03/2022 Chronic kidney disease Hypertension Hyperlipidemia   History of Present Illness:   Kyle Avila is a 77 y.o. male with the above problem list.  He was last seen by Dr. Burt Knack 05/12/2022 with symptoms of angina.  Cardiac catheterization was arranged and demonstrated patent left main to LCx stent, patent SVG-RPDA stent, patent LIMA-LAD, known occlusion of the RCA, LAD.  Medical therapy was recommended.  He was started on ranolazine. He had f/u with the CHF clinic (Dr. Daniel Nones) 05/26/22. He returns for f/u on CAD, CHF, AFib. He  is here alone. Since his cardiac catheterization, his Amlodipine was increased to 10 mg once daily b/c his BP was running high. He just changed the batteries and his BP readings have improved somewhat. However, he is still getting systolic readings around [id number]. He has done very well since starting on Ranexa. He has not had to take NTG since. He has not had significant shortness of breath. He has not had syncope, leg edema.   EKG: AV paced, HR 60   Subjective    Reviewed and updated this encounter:   Tobacco  Allergies  Meds  Problems  Med Hx  Surg Hx  Fam Hx     ROS  Objective   Labs/Other Test Reviewed:   Recent Labs: 03/05/2022: B Natriuretic Peptide 299.0 04/07/2022: ALT 47; TSH 4.800 05/16/2022: Hemoglobin 14.7; Platelets 190 05/26/2022: BUN 38; Creatinine, Ser 2.52; Potassium 4.7; Sodium 142   Recent Lipid Panel Recent Labs    04/07/22 0915  CHOL 96*  TRIG 145  HDL 57  LDLCALC 15     Risk Assessment/Calculations/Metrics:    CHA2DS2-VASc Score = 5   This indicates a 7.2% annual risk of stroke. [name] score is based upon: CHF History: 1 HTN History: 1 Diabetes History: 0 Stroke History: 0 Vascular Disease History: 1 Age Score: 2 Gender Score: 0        HYPERTENSION CONTROL Vitals:   06/23/22 1016 06/23/22 1119  BP: (!) 140/52 (!) 140/70    [name] blood pressure  is elevated above target today.  In order to address the patient's elevated BP: Blood pressure will be monitored at home to determine if medication changes need to be made.      Physical Exam:   VS:  BP (!) 140/70   Pulse 84   Ht '5\' 8"'$  (1.727 m)   Wt 172 lb 6.4 oz (78.2 kg)   SpO2 97%   BMI 26.21 kg/m    Wt Readings from Last 3 Encounters:  06/23/22 172 lb 6.4 oz (78.2 kg)  05/26/22 177 lb (80.3 kg)  05/22/22 170 lb (77.1 kg)    Constitutional:      Appearance: Healthy appearance. Not in distress.  Neck:     Vascular: JVD normal.  Pulmonary:     Breath sounds: Normal  breath sounds. No wheezing. No rales.  Cardiovascular:     Normal rate. Regular rhythm.     Murmurs: There is a grade 2/6 systolic murmur at the URSB and LLSB.  Edema:    Pretibial: bilateral trace edema of the pretibial area. Abdominal:     Palpations: Abdomen is soft.      Assessment & Plan    ASSESSMENT & PLAN:   CAD (coronary artery disease) History of bypass in 1988 and redo in 1996 and multiple PCI procedures.  Most recent cardiac catheterization in Jan 2024 demonstrated patent left main to LCx stent, patent SVG-RPDA stent, patent LIMA-LAD, known occlusion of the RCA, LAD. There were no targets for PCI and med Rx was continued. He has felt much better since starting on Ranexa. He has not had anginal symptoms since.  Continue Plavix 75 mg daily, amlodipine 10 mg daily, Imdur 120 mg daily, Toprol-XL 50 mg twice daily, Ranexa 500 mg twice daily, as needed nitroglycerin Follow up 6 months  Persistent atrial fibrillation (HCC) History of amiodarone lung toxicity.  He just finished his course of prednisone a couple of weeks ago.  Rate is well-controlled.  He is AV paced on his electrocardiogram today.  He is tolerating anticoagulation.  Weight >60 kg, age <12.  Continue apixaban 5 mg twice daily.  HFmrEF (heart failure with mildly reduced EF) EF 45-50 by echocardiogram in November 2023.  NYHA II.  Volume status stable.  He is status post upgrade to CRT.  He has follow-up with Dr. Caryl Comes in April.  He is not a candidate for ACE/ARB/ARNI/MRA due to chronic kidney disease.  Continue Jardiance 10 mg daily, Imdur 120 mg daily, Toprol-XL 50 mg twice daily.  Hypertension Blood pressure above target.  He just came off of prednisone.  His amlodipine was increased recently.  I have asked him to continue to monitor his blood pressure for couple weeks and send those readings for review.  If his blood pressure remains above target, consider increasing metoprolol versus adding hydralazine.  Continue  amlodipine 10 mg daily, Imdur 120 mg daily, Toprol-XL 50 mg twice daily.  Hyperlipidemia LDL optimal on most recent lab work.  Continue current Rx with Repatha 140 mg every 14 days.    Complete heart block s/p PPM Status post an upgrade to CRT.  He has follow-up with Dr. Caryl Comes in April.  Moderate mitral regurgitation Moderate by echo in November 2023.  Consider repeat echo in 1 to 2 years.  CKD (chronic kidney disease) stage 4, GFR 15-29 ml/min (HCC) Continue f/u with Nephrology.         Dispo:  Return in about 6 months (around 12/22/2022) for Routine Follow Up, w/ Dr. Burt Knack.  Signed, Richardson Dopp, PA-C  06/23/2022 11:36 AM    [facility], [place], [place]  [address] Phone: [phone]; Fax: [phone]

## 2022-06-23 ENCOUNTER — Ambulatory Visit: Payer: Medicare HMO | Admitting: Physician Assistant

## 2022-06-23 ENCOUNTER — Ambulatory Visit: Payer: Medicare HMO

## 2022-06-23 ENCOUNTER — Encounter: Payer: Self-pay | Admitting: Physician Assistant

## 2022-06-23 VITALS — BP 140/70 | HR 84 | Ht 68.0 in | Wt 172.4 lb

## 2022-06-23 DIAGNOSIS — E782 Mixed hyperlipidemia: Secondary | ICD-10-CM

## 2022-06-23 DIAGNOSIS — N184 Chronic kidney disease, stage 4 (severe): Secondary | ICD-10-CM

## 2022-06-23 DIAGNOSIS — I25119 Atherosclerotic heart disease of native coronary artery with unspecified angina pectoris: Secondary | ICD-10-CM

## 2022-06-23 DIAGNOSIS — I4819 Other persistent atrial fibrillation: Secondary | ICD-10-CM

## 2022-06-23 DIAGNOSIS — I442 Atrioventricular block, complete: Secondary | ICD-10-CM

## 2022-06-23 DIAGNOSIS — I5020 Unspecified systolic (congestive) heart failure: Secondary | ICD-10-CM

## 2022-06-23 DIAGNOSIS — I10 Essential (primary) hypertension: Secondary | ICD-10-CM | POA: Diagnosis not present

## 2022-06-23 DIAGNOSIS — I340 Nonrheumatic mitral (valve) insufficiency: Secondary | ICD-10-CM | POA: Diagnosis not present

## 2022-06-23 NOTE — Assessment & Plan Note (Signed)
Continue f/u with Nephrology.

## 2022-06-23 NOTE — Patient Instructions (Signed)
Medication Instructions:  Your physician recommends that you continue on your current medications as directed. Please refer to the Current Medication list given to you today.  *If you need a refill on your cardiac medications before your next appointment, please call your pharmacy*   Lab Work: None ordered  If you have labs (blood work) drawn today and your tests are completely normal, you will receive your results only by: [place] (if you have MyChart) OR A paper copy in the mail If you have any lab test that is abnormal or we need to change your treatment, we will call you to review the results.   Testing/Procedures: None ordered   Follow-Up: At [facility], you and your health needs are our priority.  As part of our continuing mission to provide you with exceptional heart care, we have created designated Provider Care Teams.  These Care Teams include your primary Cardiologist (physician) and Advanced Practice Providers (APPs -  Physician Assistants and Nurse Practitioners) who all work together to provide you with the care you need, when you need it.  We recommend signing up for the patient portal called "MyChart".  Sign up information is provided on this After Visit Summary.  MyChart is used to connect with patients for Virtual Visits (Telemedicine).  Patients are able to view lab/test results, encounter notes, upcoming appointments, etc.  Non-urgent messages can be sent to your provider as well.   To learn more about what you can do with MyChart, go to [email or web address].    Your next appointment:   6 month(s)  Provider:   [name], MD     Other Instructions

## 2022-06-23 NOTE — Assessment & Plan Note (Signed)
EF 45-50 by echocardiogram in November 2023.  NYHA II.  Volume status stable.  He is status post upgrade to CRT.  He has follow-up with Dr. Caryl Comes in April.  He is not a candidate for ACE/ARB/ARNI/MRA due to chronic kidney disease.  Continue Jardiance 10 mg daily, Imdur 120 mg daily, Toprol-XL 50 mg twice daily.

## 2022-06-23 NOTE — Assessment & Plan Note (Signed)
Status post an upgrade to CRT.  He has follow-up with Dr. Caryl Comes in April.

## 2022-06-23 NOTE — Assessment & Plan Note (Signed)
History of bypass in 1988 and redo in 1996 and multiple PCI procedures.  Most recent cardiac catheterization in Jan 2024 demonstrated patent left main to LCx stent, patent SVG-RPDA stent, patent LIMA-LAD, known occlusion of the RCA, LAD. There were no targets for PCI and med Rx was continued. He has felt much better since starting on Ranexa. He has not had anginal symptoms since.  Continue Plavix 75 mg daily, amlodipine 10 mg daily, Imdur 120 mg daily, Toprol-XL 50 mg twice daily, Ranexa 500 mg twice daily, as needed nitroglycerin Follow up 6 months

## 2022-06-23 NOTE — Assessment & Plan Note (Addendum)
History of amiodarone lung toxicity.  He just finished his course of prednisone a couple of weeks ago.  Rate is well-controlled.  He is AV paced on his electrocardiogram today.  He is tolerating anticoagulation.  Weight >60 kg, age <76.  Continue apixaban 5 mg twice daily.

## 2022-06-23 NOTE — Assessment & Plan Note (Signed)
LDL optimal on most recent lab work.  Continue current Rx with Repatha 140 mg every 14 days.

## 2022-06-23 NOTE — Assessment & Plan Note (Signed)
Moderate by echo in November 2023.  Consider repeat echo in 1 to 2 years.

## 2022-06-23 NOTE — Assessment & Plan Note (Signed)
Blood pressure above target.  He just came off of prednisone.  His amlodipine was increased recently.  I have asked him to continue to monitor his blood pressure for couple weeks and send those readings for review.  If his blood pressure remains above target, consider increasing metoprolol versus adding hydralazine.  Continue amlodipine 10 mg daily, Imdur 120 mg daily, Toprol-XL 50 mg twice daily.

## 2022-06-24 LAB — CUP PACEART REMOTE DEVICE CHECK
Battery Remaining Longevity: 95 mo
Battery Voltage: 3.16 V
Brady Statistic AP VP Percent: 89.22 %
Brady Statistic AP VS Percent: 0.01 %
Brady Statistic AS VP Percent: 10.63 %
Brady Statistic AS VS Percent: 0.14 %
Brady Statistic RA Percent Paced: 89.23 %
Brady Statistic RV Percent Paced: 99.85 %
Date Time Interrogation Session: 20240226073256
Implantable Lead Connection Status: 753985
Implantable Lead Connection Status: 753985
Implantable Lead Connection Status: 753985
Implantable Lead Implant Date: 20070831
Implantable Lead Implant Date: 20070831
Implantable Lead Implant Date: 20231227
Implantable Lead Location: 753858
Implantable Lead Location: 753859
Implantable Lead Location: 753860
Implantable Lead Model: 3830
Implantable Lead Model: 4469
Implantable Lead Model: 4470
Implantable Lead Serial Number: 485949
Implantable Lead Serial Number: 553665
Implantable Pulse Generator Implant Date: 20231227
Lead Channel Impedance Value: 247 Ohm
Lead Channel Impedance Value: 285 Ohm
Lead Channel Impedance Value: 304 Ohm
Lead Channel Impedance Value: 323 Ohm
Lead Channel Impedance Value: 361 Ohm
Lead Channel Impedance Value: 361 Ohm
Lead Channel Impedance Value: 380 Ohm
Lead Channel Impedance Value: 456 Ohm
Lead Channel Impedance Value: 513 Ohm
Lead Channel Pacing Threshold Amplitude: 0.5 V
Lead Channel Pacing Threshold Amplitude: 0.625 V
Lead Channel Pacing Threshold Amplitude: 0.75 V
Lead Channel Pacing Threshold Pulse Width: 0.4 ms
Lead Channel Pacing Threshold Pulse Width: 0.4 ms
Lead Channel Pacing Threshold Pulse Width: 0.4 ms
Lead Channel Sensing Intrinsic Amplitude: 1.5 mV
Lead Channel Sensing Intrinsic Amplitude: 1.5 mV
Lead Channel Sensing Intrinsic Amplitude: 27.125 mV
Lead Channel Sensing Intrinsic Amplitude: 7.25 mV
Lead Channel Setting Pacing Amplitude: 1.25 V
Lead Channel Setting Pacing Amplitude: 3.25 V
Lead Channel Setting Pacing Amplitude: 3.5 V
Lead Channel Setting Pacing Pulse Width: 0.4 ms
Lead Channel Setting Pacing Pulse Width: 0.4 ms
Lead Channel Setting Sensing Sensitivity: 2.8 mV
Zone Setting Status: 755011
Zone Setting Status: 755011

## 2022-06-25 DIAGNOSIS — H353211 Exudative age-related macular degeneration, right eye, with active choroidal neovascularization: Secondary | ICD-10-CM | POA: Diagnosis not present

## 2022-06-27 MED ORDER — METOPROLOL SUCCINATE ER 100 MG PO TB24: 100.0000 mg | ORAL_TABLET | Freq: Every day | ORAL | 3 refills | Status: DC

## 2022-06-27 NOTE — Telephone Encounter (Signed)
Sounds like his BP is running too high. Increase Toprol XL to 100 mg once daily  Richardson Dopp, PA-C    06/27/2022 12:18 PM

## 2022-07-03 ENCOUNTER — Telehealth: Payer: Self-pay

## 2022-07-03 DIAGNOSIS — I25119 Atherosclerotic heart disease of native coronary artery with unspecified angina pectoris: Secondary | ICD-10-CM

## 2022-07-03 MED ORDER — HYDRALAZINE HCL 25 MG PO TABS: 25.0000 mg | ORAL_TABLET | Freq: Three times a day (TID) | ORAL | 2 refills | Status: DC

## 2022-07-03 NOTE — Telephone Encounter (Signed)
The higher dose of Amlodipine is likely causing the swelling. Weights are stable. Will need to add something else to keep his BP down if we cut back on Amlodipine.  PLAN: Reduce Amlodipine to 5 mg once daily. Start Hydralazine 25 mg three times a day. Richardson Dopp, PA-C    07/03/2022 11:55 AM

## 2022-07-03 NOTE — Telephone Encounter (Signed)
Sent Hydralazine 25 mg to list pharmacy on file.

## 2022-07-04 ENCOUNTER — Telehealth: Payer: Self-pay | Admitting: Physician Assistant

## 2022-07-04 DIAGNOSIS — I25119 Atherosclerotic heart disease of native coronary artery with unspecified angina pectoris: Secondary | ICD-10-CM

## 2022-07-04 MED ORDER — HYDRALAZINE HCL 25 MG PO TABS: 25.0000 mg | ORAL_TABLET | Freq: Three times a day (TID) | ORAL | 3 refills | Status: DC

## 2022-07-04 NOTE — Telephone Encounter (Signed)
Pt's medication was sent to pt's pharmacy as requested. Confirmation received.  °

## 2022-07-04 NOTE — Telephone Encounter (Signed)
*  STAT* If patient is at the pharmacy, call can be transferred to refill team.   1. Which medications need to be refilled? (please list name of each medication and dose if known) hydrALAZINE (APRESOLINE) 25 MG tablet   2. Which pharmacy/location (including street and city if local pharmacy) is medication to be sent to? [id number] - [place], [place] - [address]   3. Do they need a 30 day or 90 day supply? 30 day

## 2022-07-09 DIAGNOSIS — I442 Atrioventricular block, complete: Secondary | ICD-10-CM | POA: Diagnosis not present

## 2022-07-09 DIAGNOSIS — I130 Hypertensive heart and chronic kidney disease with heart failure and stage 1 through stage 4 chronic kidney disease, or unspecified chronic kidney disease: Secondary | ICD-10-CM | POA: Diagnosis not present

## 2022-07-09 DIAGNOSIS — I480 Paroxysmal atrial fibrillation: Secondary | ICD-10-CM | POA: Diagnosis not present

## 2022-07-09 DIAGNOSIS — N184 Chronic kidney disease, stage 4 (severe): Secondary | ICD-10-CM | POA: Diagnosis not present

## 2022-07-09 DIAGNOSIS — R55 Syncope and collapse: Secondary | ICD-10-CM | POA: Diagnosis not present

## 2022-07-09 DIAGNOSIS — I25118 Atherosclerotic heart disease of native coronary artery with other forms of angina pectoris: Secondary | ICD-10-CM | POA: Diagnosis not present

## 2022-07-09 DIAGNOSIS — H8110 Benign paroxysmal vertigo, unspecified ear: Secondary | ICD-10-CM | POA: Diagnosis not present

## 2022-07-09 DIAGNOSIS — Z950 Presence of cardiac pacemaker: Secondary | ICD-10-CM | POA: Diagnosis not present

## 2022-07-14 ENCOUNTER — Encounter: Payer: Self-pay | Admitting: Cardiovascular Disease

## 2022-07-16 DIAGNOSIS — H8110 Benign paroxysmal vertigo, unspecified ear: Secondary | ICD-10-CM | POA: Diagnosis not present

## 2022-07-17 DIAGNOSIS — E875 Hyperkalemia: Secondary | ICD-10-CM | POA: Diagnosis not present

## 2022-07-17 DIAGNOSIS — N184 Chronic kidney disease, stage 4 (severe): Secondary | ICD-10-CM | POA: Diagnosis not present

## 2022-07-17 DIAGNOSIS — I5040 Unspecified combined systolic (congestive) and diastolic (congestive) heart failure: Secondary | ICD-10-CM | POA: Diagnosis not present

## 2022-07-17 DIAGNOSIS — R0902 Hypoxemia: Secondary | ICD-10-CM | POA: Diagnosis not present

## 2022-07-17 DIAGNOSIS — I4811 Longstanding persistent atrial fibrillation: Secondary | ICD-10-CM | POA: Diagnosis not present

## 2022-07-17 DIAGNOSIS — I130 Hypertensive heart and chronic kidney disease with heart failure and stage 1 through stage 4 chronic kidney disease, or unspecified chronic kidney disease: Secondary | ICD-10-CM | POA: Diagnosis not present

## 2022-07-17 DIAGNOSIS — N2581 Secondary hyperparathyroidism of renal origin: Secondary | ICD-10-CM | POA: Diagnosis not present

## 2022-07-17 DIAGNOSIS — R809 Proteinuria, unspecified: Secondary | ICD-10-CM | POA: Diagnosis not present

## 2022-07-18 LAB — LAB REPORT - SCANNED
Creatinine, POC: 61.7 mg/dL
EGFR: 24

## 2022-07-23 DIAGNOSIS — H8110 Benign paroxysmal vertigo, unspecified ear: Secondary | ICD-10-CM | POA: Diagnosis not present

## 2022-07-24 ENCOUNTER — Encounter: Payer: Self-pay | Admitting: Cardiovascular Disease

## 2022-07-24 ENCOUNTER — Encounter: Payer: Self-pay | Admitting: Internal Medicine

## 2022-07-24 NOTE — Telephone Encounter (Signed)
See other mychart message for conversation

## 2022-07-25 ENCOUNTER — Encounter: Payer: Self-pay | Admitting: Cardiovascular Disease

## 2022-07-28 ENCOUNTER — Ambulatory Visit: Payer: Medicare HMO | Attending: Internal Medicine | Admitting: Internal Medicine

## 2022-07-28 ENCOUNTER — Encounter: Payer: Self-pay | Admitting: Internal Medicine

## 2022-07-28 VITALS — BP 140/68 | HR 60 | Ht 68.0 in | Wt 168.8 lb

## 2022-07-28 DIAGNOSIS — I4819 Other persistent atrial fibrillation: Secondary | ICD-10-CM | POA: Diagnosis not present

## 2022-07-28 DIAGNOSIS — J984 Other disorders of lung: Secondary | ICD-10-CM

## 2022-07-28 DIAGNOSIS — I255 Ischemic cardiomyopathy: Secondary | ICD-10-CM | POA: Diagnosis not present

## 2022-07-28 DIAGNOSIS — T462X5A Adverse effect of other antidysrhythmic drugs, initial encounter: Secondary | ICD-10-CM | POA: Diagnosis not present

## 2022-07-28 DIAGNOSIS — I442 Atrioventricular block, complete: Secondary | ICD-10-CM | POA: Diagnosis not present

## 2022-07-28 LAB — CUP PACEART INCLINIC DEVICE CHECK
Battery Remaining Longevity: 120 mo
Battery Voltage: 3.14 V
Brady Statistic AP VP Percent: 90.51 %
Brady Statistic AP VS Percent: 0.03 %
Brady Statistic AS VP Percent: 9.18 %
Brady Statistic AS VS Percent: 0.28 %
Brady Statistic RA Percent Paced: 89.45 %
Brady Statistic RV Percent Paced: 99.69 %
Date Time Interrogation Session: 20240401171556
Implantable Lead Connection Status: 753985
Implantable Lead Connection Status: 753985
Implantable Lead Connection Status: 753985
Implantable Lead Implant Date: 20070831
Implantable Lead Implant Date: 20070831
Implantable Lead Implant Date: 20231227
Implantable Lead Location: 753858
Implantable Lead Location: 753859
Implantable Lead Location: 753860
Implantable Lead Model: 3830
Implantable Lead Model: 4469
Implantable Lead Model: 4470
Implantable Lead Serial Number: 485949
Implantable Lead Serial Number: 553665
Implantable Pulse Generator Implant Date: 20231227
Lead Channel Impedance Value: 266 Ohm
Lead Channel Impedance Value: 285 Ohm
Lead Channel Impedance Value: 304 Ohm
Lead Channel Impedance Value: 342 Ohm
Lead Channel Impedance Value: 361 Ohm
Lead Channel Impedance Value: 380 Ohm
Lead Channel Impedance Value: 399 Ohm
Lead Channel Impedance Value: 475 Ohm
Lead Channel Impedance Value: 532 Ohm
Lead Channel Pacing Threshold Amplitude: 0.625 V
Lead Channel Pacing Threshold Amplitude: 0.75 V
Lead Channel Pacing Threshold Amplitude: 0.75 V
Lead Channel Pacing Threshold Amplitude: 1.25 V
Lead Channel Pacing Threshold Pulse Width: 0.4 ms
Lead Channel Pacing Threshold Pulse Width: 0.4 ms
Lead Channel Pacing Threshold Pulse Width: 0.4 ms
Lead Channel Pacing Threshold Pulse Width: 0.4 ms
Lead Channel Sensing Intrinsic Amplitude: 0 mV
Lead Channel Sensing Intrinsic Amplitude: 1.5 mV
Lead Channel Sensing Intrinsic Amplitude: 1.5 mV
Lead Channel Sensing Intrinsic Amplitude: 27.125 mV
Lead Channel Sensing Intrinsic Amplitude: 7.25 mV
Lead Channel Setting Pacing Amplitude: 1.25 V
Lead Channel Setting Pacing Amplitude: 1.5 V
Lead Channel Setting Pacing Amplitude: 2.5 V
Lead Channel Setting Pacing Pulse Width: 0.4 ms
Lead Channel Setting Pacing Pulse Width: 0.4 ms
Lead Channel Setting Sensing Sensitivity: 2.8 mV
Zone Setting Status: 755011
Zone Setting Status: 755011

## 2022-07-28 NOTE — Progress Notes (Signed)
Patient Care Team: [name], MD as PCP - General (Internal Medicine) [name], MD as PCP - Cardiology (Cardiology) Deboraha Sprang, MD as PCP - Electrophysiology (Cardiology) Fleet Contras, MD as Consulting Physician (Nephrology) Deboraha Sprang, MD (Cardiology) Sharmon Revere as Physician Assistant (Cardiology)   HPI  Kyle Avila is a 77 y.o. 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  His device reached ERI and he underwent generator replacement 5/16& 12/23 w LBBpacing upgrade.  Long term persistent atrial fibrillation-- sometime recently, he spontaneously reverted to sinus rhythm and has been programmed DDD and has significant exercise intolerance.  On coming to the office for reprogramming he had reverted back to atrial fibrillation.  Started on amiodarone.  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     He has a complex cardiovascular history with bypass surgery and redo bypass in 96 and revascularization with stenting of the circumflex in 2009. At that time he had total LAD and RCA disease; his left ventricular function then was okay.  Post upgrade developed progressive chest pain>>now ranolazine with improvement   Most recently has had no chest pain at all.  He is not really exercising.   Date Cr K Hgb  1/24 2.52 4.7 14.7             DATE TEST EF   11/15 Myoview  42 % Anterolateral defect  2/18 Echo   45-50 %   12/18 Cath   S/P CABG patency  LIMA-LAD and moderate stenosis at   anastomosis Stenosis of the SVG-PDA- DESX2  implantation In-stent restenosis and de-novo mid vessel disease  11/23 Echo  45-50% MR mild Mod  1/24 LHC  Continued patency  Med Rx      Past Medical History:  Diagnosis Date   Arthritis    "some in my back" (04/14/2017)   CAD (coronary artery disease)    a.  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; lupus attacked them" (04/14/2017)   Mitral insufficiency    Moderate mitral regurgitation 10/02/2021   Echocardiogram 6/23: EF 69, global HK, mild ASH, mildly reduced RVSF, normal PASP, RVSP 31, mod LAD, mild RAE, mod MR, trivial AI, AV sclerosis w/o stenosis   Myocardial infarction    "I've had some mild one" (04/14/2017)   Presence of permanent cardiac pacemaker    Skin cancer    "nose"   VT (ventricular tachycardia)-nonsustained 05/20/2011    Past Surgical History:  Procedure Laterality Date   ACHILLES TENDON REPAIR Right    BIV UPGRADE N/A 04/23/2022   Procedure: BIV PPM UPGRADE;  Surgeon: Deboraha Sprang, MD;  Location: [facility];  Service: Cardiovascular;  Laterality: N/A;   CARDIAC CATHETERIZATION     CARDIAC CATHETERIZATION N/A 11/03/2014   Procedure: Left Heart Cath and Cors/Grafts Angiography;  Surgeon: [name], MD;  Location: [facility];  Service: Cardiovascular;  Laterality: N/A;   CARDIAC CATHETERIZATION N/A 11/03/2014   Procedure: Coronary Stent Intervention;  Surgeon: [name], MD;  Location: [facility];  Service: Cardiovascular;  Laterality: N/A;  CARDIOVERSION N/A 07/17/2021   Procedure: CARDIOVERSION;  Surgeon: [name], MD;  Location: [facility];  Service: Cardiovascular;  Laterality: N/A;   CARDIOVERSION N/A 10/08/2021   Procedure: CARDIOVERSION;  Surgeon: Freada Bergeron, MD;  Location: [facility];  Service: Cardiovascular;  Laterality: N/A;   CAROTID ENDARTERECTOMY  2002   "? side"   CATARACT EXTRACTION W/ INTRAOCULAR LENS IMPLANT     "? side"   CORONARY ANGIOPLASTY WITH STENT PLACEMENT  04/14/2017   CORONARY ANGIOPLASTY WITH STENT PLACEMENT  11/03/2014   SVG PVA   CORONARY ARTERY BYPASS GRAFT     with redo cabg   CORONARY BALLOON ANGIOPLASTY N/A  08/15/2021   Procedure: CORONARY BALLOON ANGIOPLASTY;  Surgeon: Burnell Blanks, MD;  Location: [facility];  Service: Cardiovascular;  Laterality: N/A;   CORONARY STENT INTERVENTION N/A 10/18/2019   Procedure: CORONARY STENT INTERVENTION;  Surgeon: [name], MD;  Location: [facility];  Service: Cardiovascular;  Laterality: N/A;   CORONARY/GRAFT ANGIOGRAPHY N/A 10/18/2019   Procedure: CORONARY/GRAFT ANGIOGRAPHY;  Surgeon: [name], MD;  Location: [facility];  Service: Cardiovascular;  Laterality: N/A;   EP IMPLANTABLE DEVICE N/A 09/13/2014   Procedure: PPM Generator Changeout;  Surgeon: Deboraha Sprang, MD;  Location: [facility];  Service: Cardiovascular;  Laterality: N/A;   LEFT HEART CATH AND CORS/GRAFTS ANGIOGRAPHY N/A 04/14/2017   Procedure: LEFT HEART CATH AND CORS/GRAFTS ANGIOGRAPHY;  Surgeon: [name], MD;  Location: [facility];  Service: Cardiovascular;  Laterality: N/A;   LEFT HEART CATH AND CORS/GRAFTS ANGIOGRAPHY N/A 08/15/2021   Procedure: LEFT HEART CATH AND CORS/GRAFTS ANGIOGRAPHY;  Surgeon: Burnell Blanks, MD;  Location: [facility];  Service: Cardiovascular;  Laterality: N/A;   LEFT HEART CATH AND CORS/GRAFTS ANGIOGRAPHY N/A 05/21/2022   Procedure: LEFT HEART CATH AND CORS/GRAFTS ANGIOGRAPHY;  Surgeon: [name], MD;  Location: [facility];  Service: Cardiovascular;  Laterality: N/A;   MOHS SURGERY     "just outside my nose"   PACEMAKER INSERTION  2007   TONSILLECTOMY  1958    Current Outpatient Medications  Medication Sig Dispense Refill   acetaminophen (TYLENOL) 500 MG tablet Take 1,000 mg by mouth every 6 (six) hours as needed for moderate pain.     Aflibercept (EYLEA) 2 MG/0.05ML SOLN by Intravitreal route.     allopurinol (ZYLOPRIM) 100 MG tablet Take 100 mg by mouth at bedtime.     amLODipine (NORVASC) 10 MG tablet Take 5 mg by mouth daily.     apixaban (ELIQUIS) 5 MG TABS tablet  TAKE 1 TABLET TWICE DAILY 180 tablet 1   azelastine (ASTELIN) 0.1 % nasal spray Place 1 spray into both nostrils 2 (two) times daily. Use in each nostril as directed     clopidogrel (PLAVIX) 75 MG tablet Take 1 tablet (75 mg total) by mouth at bedtime. 90 tablet 3   empagliflozin (JARDIANCE) 10 MG TABS tablet Take 1 tablet (10 mg total) by mouth daily before breakfast. 90 tablet 3   furosemide (LASIX) 80 MG tablet Take 1 tablet (80 mg total) by mouth daily. 90 tablet 3   hydrALAZINE (APRESOLINE) 25 MG tablet Take 1 tablet (25 mg total) by mouth 3 (three) times daily. 270 tablet 3   isosorbide mononitrate (IMDUR) 120 MG 24 hr tablet TAKE 1 TABLET (120 MG TOTAL) BY MOUTH DAILY. 90 tablet 3   ketoconazole (NIZORAL) 2 % cream Apply 1 application. topically daily as needed (itching  toes).     metoprolol succinate (TOPROL-XL) 100 MG 24 hr tablet Take 1 tablet (100 mg total) by mouth daily. Take with or immediately following a meal. (Patient taking differently: Take 200 mg by mouth 2 (two) times daily. Take with or immediately following a meal.) 90 tablet 3   nitroGLYCERIN (NITROSTAT) 0.4 MG SL tablet PLACE 1 TABLET UNDER THE TONGUE EVERY 5 MINUTES AS NEEDED FOR CHEST PAIN 25 tablet 3   ranolazine (RANEXA) 500 MG 12 hr tablet Take 1 tablet (500 mg total) by mouth 2 (two) times daily. 180 tablet 3   REPATHA SURECLICK [id number] MG/ML SOAJ Inject 140 mg into the skin every 14 (fourteen) days. 6 mL 3   tamsulosin (FLOMAX) 0.4 MG CAPS Take 0.4 mg by mouth daily.     No current facility-administered medications for this visit.    Allergies  Allergen Reactions   Amiodarone Other (See Comments)    Lung toxicity     Review of Systems negative except from HPI and PMH  Physical Exam BP (!) 140/68   Pulse 60   Ht 5\' 8"  (1.727 m)   Wt 168 lb 12.8 oz (76.6 kg)   SpO2 95%   BMI 25.67 kg/m    Well developed and well nourished in no acute distress HENT normal Neck supple with JVP-flat Clear Device pocket well  healed; without hematoma or erythema.  There is no tethering  Regular rate and rhythm,  gallop 2/6 murmur Abd-soft with active BS No Clubbing cyanosis  edema Skin-warm and dry A & Oriented  Grossly normal sensory and motor function  ECG 1 atrial  ibrillation with ventricular pacing at 60 Interval-/19/48  Device function is normal. Programming changes   See Paceart for details    Assessment and  Plan  Ischemic cardiomyopathy with prior MI  Hypertension   Pacemaker-[employer]tronic    Complete heart block device dependent  Renal insufficiency grade 4  PVCs  Sinus node dysfunction  Atrial fibrillation-permanent  Amiodarone question lung toxicity  He has done significantly better following pacing and ranolazine.  An echocardiogram is pending to reassess MR.  His heart rate is limited to 60 bpm.  We have discussed the trade-offs between incremental heart rate and chest pain.  He is going to try to exercise and following these efforts we will discuss the releasing of his limited rate response by reactivating.

## 2022-07-28 NOTE — Patient Instructions (Signed)
Medication Instructions:  Your physician recommends that you continue on your current medications as directed. Please refer to the Current Medication list given to you today.  *If you need a refill on your cardiac medications before your next appointment, please call your pharmacy*   Lab Work: None ordered.  If you have labs (blood work) drawn today and your tests are completely normal, you will receive your results only by: MyChart Message (if you have MyChart) OR A paper copy in the mail If you have any lab test that is abnormal or we need to change your treatment, we will call you to review the results.   Testing/Procedures: None ordered.    Follow-Up: At [facility], you and your health needs are our priority.  As part of our continuing mission to provide you with exceptional heart care, we have created designated Provider Care Teams.  These Care Teams include your primary Cardiologist (physician) and Advanced Practice Providers (APPs -  Physician Assistants and Nurse Practitioners) who all work together to provide you with the care you need, when you need it.  We recommend signing up for the patient portal called "MyChart".  Sign up information is provided on this After Visit Summary.  MyChart is used to connect with patients for Virtual Visits (Telemedicine).  Patients are able to view lab/test results, encounter notes, upcoming appointments, etc.  Non-urgent messages can be sent to your provider as well.   To learn more about what you can do with MyChart, go to [email or web address].    Your next appointment:   3 months with Dr Klein 

## 2022-08-04 NOTE — Progress Notes (Signed)
Remote pacemaker transmission.   

## 2022-08-08 ENCOUNTER — Encounter: Payer: Self-pay | Admitting: Internal Medicine

## 2022-08-12 ENCOUNTER — Encounter: Payer: Self-pay | Admitting: Internal Medicine

## 2022-08-13 DIAGNOSIS — H353211 Exudative age-related macular degeneration, right eye, with active choroidal neovascularization: Secondary | ICD-10-CM | POA: Diagnosis not present

## 2022-08-13 NOTE — Telephone Encounter (Signed)
Device clinic but I would want to stick my head in Thanks SK

## 2022-08-15 ENCOUNTER — Ambulatory Visit: Payer: Medicare HMO | Admitting: Podiatry

## 2022-08-15 ENCOUNTER — Encounter (HOSPITAL_COMMUNITY): Payer: Self-pay

## 2022-08-15 ENCOUNTER — Encounter: Payer: Self-pay | Admitting: Cardiovascular Disease

## 2022-08-15 ENCOUNTER — Ambulatory Visit (HOSPITAL_COMMUNITY)
Admission: EM | Admit: 2022-08-15 | Discharge: 2022-08-15 | Disposition: A | Discharge status: Home / Self Care | Payer: Medicare HMO

## 2022-08-15 ENCOUNTER — Encounter: Payer: Self-pay | Admitting: Podiatry

## 2022-08-15 DIAGNOSIS — S99922A Unspecified injury of left foot, initial encounter: Secondary | ICD-10-CM

## 2022-08-15 DIAGNOSIS — L600 Ingrowing nail: Secondary | ICD-10-CM

## 2022-08-15 DIAGNOSIS — R58 Hemorrhage, not elsewhere classified: Secondary | ICD-10-CM

## 2022-08-15 NOTE — Patient Instructions (Signed)

## 2022-08-15 NOTE — ED Provider Notes (Signed)
[facility]    CSN: [id number] Arrival date & time: 08/15/22  1450      History   Chief Complaint Chief Complaint  Patient presents with   bleeding    HPI Kyle Avila is a 77 y.o. male.   Patient presents to clinic for right great toe bleeding, he had an ingrown toenail removal earlier today.  After he left the office it was fine, no bleeding.  He is on eliquis and plavix.  He remembers looking down at the grocery store and he had lost his dressing and he had blood everywhere.  He tried to go back to podiatry but the provider had left already. He was at the grocery store so he put a plastic bag over his foot to help gather the bleeding.  Upon presentation to clinic bleeding is controlled.  The history is provided by the patient and medical records.    Past Medical History:  Diagnosis Date   Arthritis    "some in my back" (04/14/2017)   CAD (coronary artery disease)    a.  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; lupus attacked them" (04/14/2017)   Mitral insufficiency    Moderate mitral regurgitation 10/02/2021   Echocardiogram 6/23: EF 45, global HK, mild ASH, mildly reduced RVSF, normal PASP, RVSP 31, mod LAD, mild RAE, mod MR, trivial AI, AV sclerosis w/o stenosis   Myocardial infarction    "I've had some mild one" (04/14/2017)   Presence of permanent cardiac pacemaker    Skin cancer    "nose"   VT (ventricular tachycardia)-nonsustained 05/20/2011    Patient Active Problem List   Diagnosis Date Noted   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   Coronary artery disease with exertional angina 05/21/2022   Moderate mitral regurgitation 10/02/2021   Secondary hypercoagulable state 09/26/2021   Ischemic cardiomyopathy  08/16/2021   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   Complete heart block s/p PPM 08/16/2021   Persistent atrial fibrillation 06/24/2021   Amnesia 06/17/2021   Chronic cough 11/21/2019   Angina pectoris 10/18/2019   Gastro-esophageal reflux disease without esophagitis 10/13/2018   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   History of chronic cough 03/25/2018   Benign neoplasm of colon 03/23/2018   CKD (chronic kidney disease) stage 4, GFR 15-29 ml/min 03/16/2018   Superficial foreign body of finger without major open wound and without infection 03/10/2018   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   CAD (coronary artery disease) 11/04/2014   PVD - s/p CEA '02 11/04/2014   Exertional angina 11/03/2014   Non-thrombocytopenic purpura 08/09/2014   Aftercare following surgery of the circulatory system, NEC 07/19/2013   Displacement of lumbar 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 myelopathy 06/07/2013   Benign prostatic hyperplasia without lower urinary tract symptoms 05/17/2012   Occlusion and stenosis of carotid artery without mention of cerebral infarction 06/17/2011   Incomplete heart block 2:1 05/20/2011   VT (ventricular tachycardia)-nonsustained 05/20/2011   Benign paroxysmal positional vertigo 03/03/2011   Hypertension 01/22/2011   Hyperlipidemia 01/22/2011   Hx of CABG '88 and re do '96 05/20/2010   PTVDP '07 with  gen change May 2016 05/20/2010   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   Gout 03/01/2009   Hypothyroidism 03/01/2009   Low back pain 03/01/2009   Osteoarthritis 03/01/2009    Past Surgical History:  Procedure Laterality Date   ACHILLES TENDON REPAIR Right    BIV UPGRADE N/A 04/23/2022   Procedure: BIV PPM UPGRADE;  Surgeon: Duke Salvia, MD;  Location: [facility] INVASIVE CV LAB;  Service: Cardiovascular;  Laterality: N/A;   CARDIAC CATHETERIZATION     CARDIAC CATHETERIZATION N/A 11/03/2014    Procedure: Left Heart Cath and Cors/Grafts Angiography;  Surgeon: Tonny Bollman, MD;  Location: [facility] INVASIVE CV LAB;  Service: Cardiovascular;  Laterality: N/A;   CARDIAC CATHETERIZATION N/A 11/03/2014   Procedure: Coronary Stent Intervention;  Surgeon: Tonny Bollman, MD;  Location: [facility] INVASIVE CV LAB;  Service: Cardiovascular;  Laterality: N/A;   CARDIOVERSION N/A 07/17/2021   Procedure: CARDIOVERSION;  Surgeon: Maisie Fus, MD;  Location: [facility] ENDOSCOPY;  Service: Cardiovascular;  Laterality: N/A;   CARDIOVERSION N/A 10/08/2021   Procedure: CARDIOVERSION;  Surgeon: Meriam Sprague, MD;  Location: [facility] ENDOSCOPY;  Service: Cardiovascular;  Laterality: N/A;   CAROTID ENDARTERECTOMY  2002   "? side"   CATARACT EXTRACTION W/ INTRAOCULAR LENS IMPLANT     "? side"   CORONARY ANGIOPLASTY WITH STENT PLACEMENT  04/14/2017   CORONARY ANGIOPLASTY WITH STENT PLACEMENT  11/03/2014   SVG PVA   CORONARY ARTERY BYPASS GRAFT     with redo cabg   CORONARY BALLOON ANGIOPLASTY N/A 08/15/2021   Procedure: CORONARY BALLOON ANGIOPLASTY;  Surgeon: Kathleene Hazel, MD;  Location: [facility] INVASIVE CV LAB;  Service: Cardiovascular;  Laterality: N/A;   CORONARY STENT INTERVENTION N/A 10/18/2019   Procedure: CORONARY STENT INTERVENTION;  Surgeon: Tonny Bollman, MD;  Location: [facility] INVASIVE CV LAB;  Service: Cardiovascular;  Laterality: N/A;   CORONARY/GRAFT ANGIOGRAPHY N/A 10/18/2019   Procedure: CORONARY/GRAFT ANGIOGRAPHY;  Surgeon: Tonny Bollman, MD;  Location: [facility] INVASIVE CV LAB;  Service: Cardiovascular;  Laterality: N/A;   EP IMPLANTABLE DEVICE N/A 09/13/2014   Procedure: PPM Generator Changeout;  Surgeon: Duke Salvia, MD;  Location: [facility] INVASIVE CV LAB;  Service: Cardiovascular;  Laterality: N/A;   LEFT HEART CATH AND CORS/GRAFTS ANGIOGRAPHY N/A 04/14/2017   Procedure: LEFT HEART CATH AND CORS/GRAFTS ANGIOGRAPHY;  Surgeon: Tonny Bollman, MD;  Location: [facility] INVASIVE CV LAB;  Service: Cardiovascular;   Laterality: N/A;   LEFT HEART CATH AND CORS/GRAFTS ANGIOGRAPHY N/A 08/15/2021   Procedure: LEFT HEART CATH AND CORS/GRAFTS ANGIOGRAPHY;  Surgeon: Kathleene Hazel, MD;  Location: [facility] INVASIVE CV LAB;  Service: Cardiovascular;  Laterality: N/A;   LEFT HEART CATH AND CORS/GRAFTS ANGIOGRAPHY N/A 05/21/2022   Procedure: LEFT HEART CATH AND CORS/GRAFTS ANGIOGRAPHY;  Surgeon: Tonny Bollman, MD;  Location: [facility] INVASIVE CV LAB;  Service: Cardiovascular;  Laterality: N/A;   MOHS SURGERY     "just outside my nose"   PACEMAKER INSERTION  2007   TONSILLECTOMY  1958       Home Medications    Prior to Admission medications   Medication Sig Start Date End Date Taking? Authorizing Provider  acetaminophen (TYLENOL) 500 MG tablet Take 1,000 mg by mouth every 6 (six) hours as needed for moderate pain.    [provider]  Aflibercept (EYLEA) 2 MG/0.05ML SOLN by Intravitreal route.    [provider]  allopurinol (ZYLOPRIM) 100 MG tablet Take 100 mg by mouth at bedtime.    [provider]  amLODipine (NORVASC) 10 MG  tablet Take 5 mg by mouth daily. 05/22/16   [provider]  apixaban (ELIQUIS) 5 MG TABS tablet TAKE 1 TABLET TWICE DAILY 06/04/22   Tonny Bollman, MD  azelastine (ASTELIN) 0.1 % nasal spray Place 1 spray into both nostrils 2 (two) times daily. Use in each nostril as directed    [provider]  clopidogrel (PLAVIX) 75 MG tablet Take 1 tablet (75 mg total) by mouth at bedtime. 06/04/22   Tonny Bollman, MD  empagliflozin (JARDIANCE) 10 MG TABS tablet Take 1 tablet (10 mg total) by mouth daily before breakfast. 06/04/22   Tonny Bollman, MD  furosemide (LASIX) 80 MG tablet Take 1 tablet (80 mg total) by mouth daily. 02/19/22   Tonny Bollman, MD  hydrALAZINE (APRESOLINE) 25 MG tablet Take 1 tablet (25 mg total) by mouth 3 (three) times daily. 07/04/22   Tereso Newcomer T, PA-C  isosorbide mononitrate (IMDUR) 120 MG 24 hr tablet TAKE 1 TABLET (120 MG  TOTAL) BY MOUTH DAILY. 11/11/21   Tonny Bollman, MD  ketoconazole (NIZORAL) 2 % cream Apply 1 application. topically daily as needed (itching toes).    [provider]  metoprolol succinate (TOPROL-XL) 100 MG 24 hr tablet Take 1 tablet (100 mg total) by mouth daily. Take with or immediately following a meal. Patient taking differently: Take 200 mg by mouth 2 (two) times daily. Take with or immediately following a meal. 06/27/22   Weaver, Lorin Picket T, PA-C  nitroGLYCERIN (NITROSTAT) 0.4 MG SL tablet PLACE 1 TABLET UNDER THE TONGUE EVERY 5 MINUTES AS NEEDED FOR CHEST PAIN 04/30/21 05/27/23  Tonny Bollman, MD  ranolazine (RANEXA) 500 MG 12 hr tablet Take 1 tablet (500 mg total) by mouth 2 (two) times daily. 05/22/22   Jonita Albee, PA-C  REPATHA SURECLICK 140 MG/ML SOAJ Inject 140 mg into the skin every 14 (fourteen) days. 08/28/21   Tereso Newcomer T, PA-C  tamsulosin (FLOMAX) 0.4 MG CAPS Take 0.4 mg by mouth daily. 09/25/12   [provider]    Family History Family History  Problem Relation Age of Onset   Heart attack Father        43s   Heart disease Father        Heart Disease before age 44   Cancer Mother     Social History Social History   Tobacco Use   Smoking status: Never    Passive exposure: Yes   Smokeless tobacco: Never   Tobacco comments:    Never smoke 10/21/21  Vaping Use   Vaping Use: Never used  Substance Use Topics   Alcohol use: Yes    Alcohol/week: 3.0 standard drinks of alcohol    Types: 1 Glasses of wine, 1 Cans of beer, 1 Standard drinks or equivalent per week    Comment: 2-3 drinks per week 10/21/21   Drug use: No     Allergies   Amiodarone   Review of Systems Review of Systems  Constitutional:  Negative for fever.  Skin:  Positive for wound.  Neurological:  Negative for syncope.     Physical Exam Triage Vital Signs ED Triage Vitals  Enc Vitals Group     BP 08/15/22 1551 (!) 147/75     Pulse Rate 08/15/22 1551 (!) 59      Resp 08/15/22 1551 16     Temp 08/15/22 1551 98 F (36.7 C)     Temp Source 08/15/22 1551 Oral     SpO2 08/15/22 1551 94 %  Weight --      Height --      Head Circumference --      Peak Flow --      Pain Score 08/15/22 1553 0     Pain Loc --      Pain Edu? --      Excl. in GC? --    No data found.  Updated Vital Signs BP (!) 147/75 (BP Location: Left Arm)   Pulse (!) 59   Temp 98 F (36.7 C) (Oral)   Resp 16   SpO2 94%   Visual Acuity Right Eye Distance:   Left Eye Distance:   Bilateral Distance:    Right Eye Near:   Left Eye Near:    Bilateral Near:     Physical Exam Vitals and nursing note reviewed.  Constitutional:      Appearance: Normal appearance.  HENT:     Head: Normocephalic and atraumatic.     Right Ear: External ear normal.     Left Ear: External ear normal.     Nose: Nose normal.     Mouth/Throat:     Mouth: Mucous membranes are moist.  Eyes:     Conjunctiva/sclera: Conjunctivae normal.  Cardiovascular:     Rate and Rhythm: Normal rate and regular rhythm.     Pulses:          Dorsalis pedis pulses are 2+ on the left side.       Posterior tibial pulses are 2+ on the left side.  Pulmonary:     Effort: Pulmonary effort is normal. No respiratory distress.  Feet:     Left foot:     Skin integrity: Skin integrity normal.     Comments: Right great ingrown toe removed earlier, band-aid in place. Bleeding controlled. Sensation intact.  Skin:    General: Skin is warm and dry.     Capillary Refill: Capillary refill takes less than 2 seconds.  Neurological:     General: No focal deficit present.     Mental Status: He is alert and oriented to person, place, and time.  Psychiatric:        Mood and Affect: Mood normal.        Behavior: Behavior normal. Behavior is cooperative.      UC Treatments / Results  Labs (all labs ordered are listed, but only abnormal results are displayed) Labs Reviewed - No data to display  EKG   Radiology No  results found.  Procedures Procedures (including critical care time)  Medications Ordered in UC Medications - No data to display  Initial Impression / Assessment and Plan / UC Course  I have reviewed the triage vital signs and the nursing notes.  Pertinent labs & imaging results that were available during my care of the patient were reviewed by me and considered in my medical decision making (see chart for details).  Vitals and triage reviewed, patient is hemodynamically stable.  Presents to clinic with left great toe bleeding after ingrown toenail removal.  Patient is on Eliquis and Plavix.  Upon presentation, bleeding is controlled.  Advised on proper wound care.  Advised elevation and education that this might happen again when he takes his Band-Aid off and potentially reopen to this wound.  Patient verbalized understanding, no questions at this time.     Final Clinical Impressions(s) / UC Diagnoses   Final diagnoses:  Bleeding  Toe injury, left, initial encounter     Discharge Instructions  Your bleeding was controlled in clinic today.  When you are ready to soak your toe tonight you can remove your bandage.  You will probably have some bleeding as this might reopen the clot.  You can use your clotting powder at home and elevate your toe if this bleeding re-starts.  You can soak your foot in warm water with an over-the-counter antibacterial solution like Hibiclens.  You can do this 2-3 times daily.  Keep the area clean and dry.  Please return to clinic or seek immediate care if you develop warmth, fever, loss of consciousness, or any new concerning symptoms.     ED Prescriptions   None    PDMP not reviewed this encounter.   Shawna Kiener, [place], [place] 08/15/22 [phone]

## 2022-08-15 NOTE — Discharge Instructions (Signed)
Your bleeding was controlled in clinic today.  When you are ready to soak your toe tonight you can remove your bandage.  You will probably have some bleeding as this might reopen the clot.  You can use your clotting powder at home and elevate your toe if this bleeding re-starts.  You can soak your foot in warm water with an over-the-counter antibacterial solution like Hibiclens.  You can do this 2-3 times daily.  Keep the area clean and dry.  Please return to clinic or seek immediate care if you develop warmth, fever, loss of consciousness, or any new concerning symptoms.

## 2022-08-15 NOTE — ED Triage Notes (Addendum)
Patient reports that he had his right great toenail removed at 1200 today. Patient states while shopping at 1300 patient noted that his right toe was bleeding significantly. Patient presents with a plstic grocery bag around his right foot and a significant amount of bleeding noted.   Patient states he went back to the Podiatrist, but they had left for the day.  Patient is currently taking Eliquis and Plavix.

## 2022-08-16 ENCOUNTER — Emergency Department (HOSPITAL_COMMUNITY)
Admission: EM | Admit: 2022-08-16 | Discharge: 2022-08-16 | Disposition: A | Discharge status: Home / Self Care | Payer: Medicare HMO | Attending: Emergency Medicine | Admitting: Emergency Medicine

## 2022-08-16 ENCOUNTER — Other Ambulatory Visit: Payer: Self-pay

## 2022-08-16 ENCOUNTER — Encounter (HOSPITAL_COMMUNITY): Payer: Self-pay

## 2022-08-16 DIAGNOSIS — Z4800 Encounter for change or removal of nonsurgical wound dressing: Secondary | ICD-10-CM | POA: Diagnosis not present

## 2022-08-16 DIAGNOSIS — Z7901 Long term (current) use of anticoagulants: Secondary | ICD-10-CM | POA: Diagnosis not present

## 2022-08-16 DIAGNOSIS — Z5189 Encounter for other specified aftercare: Secondary | ICD-10-CM

## 2022-08-16 DIAGNOSIS — Z7902 Long term (current) use of antithrombotics/antiplatelets: Secondary | ICD-10-CM | POA: Insufficient documentation

## 2022-08-16 DIAGNOSIS — L600 Ingrowing nail: Secondary | ICD-10-CM | POA: Insufficient documentation

## 2022-08-16 DIAGNOSIS — Z4801 Encounter for change or removal of surgical wound dressing: Secondary | ICD-10-CM | POA: Diagnosis not present

## 2022-08-16 NOTE — ED Triage Notes (Signed)
Patient here with right great toe bleeding after having toenail removed yesterday. Patient states that the foot doctor was not aware he is on eliquis and plavix. Patient took dressing off to soak and the bleeding has returned. Arrived with foot wrapped in plastic

## 2022-08-16 NOTE — ED Provider Notes (Signed)
[place] EMERGENCY DEPARTMENT AT [facility] Provider Note   CSN: [id number] Arrival date & time: 08/16/22  7829     History  No chief complaint on file.   Kyle Avila is a 77 y.o. male.  77 year old male with prior medical history as detailed below presents for evaluation.  Patient reports that right great toenail was removed after it was diagnosed as being ingrown.  Removal of toenail occurred yesterday.  Patient is on both Plavix and Eliquis.  Patient had bleeding yesterday afternoon after the toenail was removed.  He was seen in urgent care for same.  This morning he presents to the ED with same complaint.  His toe has 2 loose Band-Aids on it.  There is significant clot around the toe.  He is wearing a pair of sandals.  His entire foot is in a plastic shopping bag tight around the ankle.  He reports that he did not change or add more dressing to the right great toenail since being seen by urgent care yesterday.  He denies significant pain, fever, weakness, nausea, vomiting, other complaint.  The history is provided by the patient and medical records.       Home Medications Prior to Admission medications   Medication Sig Start Date End Date Taking? Authorizing Provider  acetaminophen (TYLENOL) 500 MG tablet Take 1,000 mg by mouth every 6 (six) hours as needed for moderate pain.    [provider]  Aflibercept (EYLEA) 2 MG/0.05ML SOLN by Intravitreal route.    [provider]  allopurinol (ZYLOPRIM) 100 MG tablet Take 100 mg by mouth at bedtime.    [provider]  amLODipine (NORVASC) 10 MG tablet Take 5 mg by mouth daily. 05/22/16   [provider]  apixaban (ELIQUIS) 5 MG TABS tablet TAKE 1 TABLET TWICE DAILY 06/04/22   Tonny Bollman, MD  azelastine (ASTELIN) 0.1 % nasal spray Place 1 spray into both nostrils 2 (two) times daily. Use in each nostril as directed    [provider]  clopidogrel (PLAVIX) 75 MG tablet  Take 1 tablet (75 mg total) by mouth at bedtime. 06/04/22   Tonny Bollman, MD  empagliflozin (JARDIANCE) 10 MG TABS tablet Take 1 tablet (10 mg total) by mouth daily before breakfast. 06/04/22   Tonny Bollman, MD  furosemide (LASIX) 80 MG tablet Take 1 tablet (80 mg total) by mouth daily. 02/19/22   Tonny Bollman, MD  hydrALAZINE (APRESOLINE) 25 MG tablet Take 1 tablet (25 mg total) by mouth 3 (three) times daily. 07/04/22   Tereso Newcomer T, PA-C  isosorbide mononitrate (IMDUR) 120 MG 24 hr tablet TAKE 1 TABLET (120 MG TOTAL) BY MOUTH DAILY. 11/11/21   Tonny Bollman, MD  ketoconazole (NIZORAL) 2 % cream Apply 1 application. topically daily as needed (itching toes).    [provider]  metoprolol succinate (TOPROL-XL) 100 MG 24 hr tablet Take 1 tablet (100 mg total) by mouth daily. Take with or immediately following a meal. Patient taking differently: Take 200 mg by mouth 2 (two) times daily. Take with or immediately following a meal. 06/27/22   Weaver, Lorin Picket T, PA-C  nitroGLYCERIN (NITROSTAT) 0.4 MG SL tablet PLACE 1 TABLET UNDER THE TONGUE EVERY 5 MINUTES AS NEEDED FOR CHEST PAIN 04/30/21 05/27/23  Tonny Bollman, MD  ranolazine (RANEXA) 500 MG 12 hr tablet Take 1 tablet (500 mg total) by mouth 2 (two) times daily. 05/22/22   Jonita Albee, PA-C  REPATHA SURECLICK 140 MG/ML SOAJ Inject 140  mg into the skin every 14 (fourteen) days. 08/28/21   Tereso Newcomer T, PA-C  tamsulosin (FLOMAX) 0.4 MG CAPS Take 0.4 mg by mouth daily. 09/25/12   [provider]      Allergies    Amiodarone    Review of Systems   Review of Systems  All other systems reviewed and are negative.   Physical Exam Updated Vital Signs BP (!) 149/75 (BP Location: Right Arm)   Pulse (!) 59   Temp 98.5 F (36.9 C) (Oral)   Resp 15   SpO2 98%  Physical Exam Vitals and nursing note reviewed.  Constitutional:      General: He is not in acute distress.    Appearance: Normal appearance. He is well-developed.   HENT:     Head: Normocephalic and atraumatic.  Eyes:     Conjunctiva/sclera: Conjunctivae normal.     Pupils: Pupils are equal, round, and reactive to light.  Cardiovascular:     Rate and Rhythm: Normal rate and regular rhythm.     Heart sounds: Normal heart sounds.  Pulmonary:     Effort: Pulmonary effort is normal. No respiratory distress.     Breath sounds: Normal breath sounds.  Abdominal:     General: There is no distension.     Palpations: Abdomen is soft.     Tenderness: There is no abdominal tenderness.  Musculoskeletal:        General: No deformity. Normal range of motion.     Cervical back: Normal range of motion and neck supple.     Comments: Right great toe and toenail bed examined.  Patient with recent removal of right great toe.  There is clot along the lateral right great toenail nailbed.  There is minimal active venous oozing from this area.  Bleeding controlled with gentle pressure.  Bulky dressing with quick clot applied to right great toe/nailbed  Skin:    General: Skin is warm and dry.  Neurological:     General: No focal deficit present.     Mental Status: He is alert and oriented to person, place, and time.     ED Results / Procedures / Treatments   Labs (all labs ordered are listed, but only abnormal results are displayed) Labs Reviewed - No data to display  EKG None  Radiology No results found.  Procedures Procedures    Medications Ordered in ED Medications - No data to display  ED Course/ Medical Decision Making/ A&P                             Medical Decision Making   Medical Screen Complete  This patient presented to the ED with complaint of bleeding at site of recent toenail removal.  This complaint involves an extensive number of treatment options. The initial differential diagnosis includes, but is not limited to, bleeding at site of recent toenail removal  This presentation is: Acute, Self-Limited, and Previously  Undiagnosed  Patient with complaint of bleeding at site of recent toenail removal.  Patient presents with bleeding from toenail bed.  Loose Band-Aid is in place.  Minimal venous oozing noted on exam.  With bulky dressing and Kerlix wrap bleeding is controlled.  Patient educated about how to control his bleeding and to dress his nailbed.  Patient is advised to closely follow-up with his podiatrist on Monday morning.  I recommended that he have supervised dressing change with podiatry on Monday.  Importance of  close follow-up was stressed.  Strict return precautions given and understood.   Additional history obtained:  External records from outside sources obtained and reviewed including prior ED visits and prior Inpatient records.   Problem List / ED Course:  Bleeding from nailbed after recent toenail removal   Reevaluation:  After the interventions noted above, I reevaluated the patient and found that they have: improved   Disposition:  After consideration of the diagnostic results and the patients response to treatment, I feel that the patent would benefit from close outpatient follow-up.          Final Clinical Impression(s) / ED Diagnoses Final diagnoses:  Visit for wound check    Rx / DC Orders ED Discharge Orders     None         Wynetta Fines, MD 08/16/22 [phone]

## 2022-08-16 NOTE — Discharge Instructions (Signed)
Return for any problem.  Keep dressing to toe in place until Monday.  Contact your podiatrist on Monday for close follow-up.  If possible, I would recommend dressing change with podiatry in the office so that you can have assistance with any bleeding that may occur during dressing change.

## 2022-08-17 NOTE — Progress Notes (Signed)
Subjective:   Patient ID: Bonnye Fava, male   DOB: 77 y.o.   MRN: [id number]   HPI Patient presents with a thickened discolored big toenail right that has been bothersome and is very difficult to cut and is in 2 pieces.  He did have trauma to it several years ago   ROS      Objective:  Physical Exam  Neurovascular status intact with patient found to have a dystrophic damaged right big toenail with 2 different portions to it secondary to the trauma that occurred with pain with remaining nails just showing discoloration.  Patient is found to have good digital perfusion well-oriented     Assessment:  Severe trauma right hallux nail with pain     Plan:  Reviewed condition with him and different options and he is opted for permanent removal.  Explained procedure risk patient wants procedure signed consent form and today I infiltrated the right big toe 60 mg Xylocaine Marcaine mixture sterile prep done and using sterile instrumentation remove the hallux nail exposed matrix applied phenol 5 applications 30 seconds followed by alcohol lavage sterile dressing gave instructions on soaks leave dressing on for approximately 24 hours and take off earlier if throbbing were to occur and encouraged him to call questions concerns

## 2022-08-18 ENCOUNTER — Ambulatory Visit (INDEPENDENT_AMBULATORY_CARE_PROVIDER_SITE_OTHER): Payer: Medicare HMO | Admitting: Podiatry

## 2022-08-18 DIAGNOSIS — L600 Ingrowing nail: Secondary | ICD-10-CM

## 2022-08-18 DIAGNOSIS — L570 Actinic keratosis: Secondary | ICD-10-CM | POA: Diagnosis not present

## 2022-08-18 DIAGNOSIS — D692 Other nonthrombocytopenic purpura: Secondary | ICD-10-CM | POA: Diagnosis not present

## 2022-08-18 DIAGNOSIS — Z85828 Personal history of other malignant neoplasm of skin: Secondary | ICD-10-CM | POA: Diagnosis not present

## 2022-08-18 DIAGNOSIS — L821 Other seborrheic keratosis: Secondary | ICD-10-CM | POA: Diagnosis not present

## 2022-08-18 DIAGNOSIS — L814 Other melanin hyperpigmentation: Secondary | ICD-10-CM | POA: Diagnosis not present

## 2022-08-18 DIAGNOSIS — D1801 Hemangioma of skin and subcutaneous tissue: Secondary | ICD-10-CM | POA: Diagnosis not present

## 2022-08-19 ENCOUNTER — Ambulatory Visit: Payer: Medicare HMO | Attending: Interventional Cardiology

## 2022-08-19 DIAGNOSIS — I255 Ischemic cardiomyopathy: Secondary | ICD-10-CM

## 2022-08-19 LAB — CUP PACEART INCLINIC DEVICE CHECK
Battery Remaining Longevity: 122 mo
Battery Voltage: 3.13 V
Brady Statistic AP VP Percent: 97.09 %
Brady Statistic AP VS Percent: 0.04 %
Brady Statistic AS VP Percent: 2.64 %
Brady Statistic AS VS Percent: 0.24 %
Brady Statistic RA Percent Paced: 97.2 %
Brady Statistic RV Percent Paced: 99.72 %
Date Time Interrogation Session: 20240423121934
Implantable Lead Connection Status: 753985
Implantable Lead Connection Status: 753985
Implantable Lead Connection Status: 753985
Implantable Lead Implant Date: 20070831
Implantable Lead Implant Date: 20070831
Implantable Lead Implant Date: 20231227
Implantable Lead Location: 753858
Implantable Lead Location: 753859
Implantable Lead Location: 753860
Implantable Lead Model: 3830
Implantable Lead Model: 4469
Implantable Lead Model: 4470
Implantable Lead Serial Number: 485949
Implantable Lead Serial Number: 553665
Implantable Pulse Generator Implant Date: 20231227
Lead Channel Impedance Value: 247 Ohm
Lead Channel Impedance Value: 285 Ohm
Lead Channel Impedance Value: 323 Ohm
Lead Channel Impedance Value: 323 Ohm
Lead Channel Impedance Value: 361 Ohm
Lead Channel Impedance Value: 380 Ohm
Lead Channel Impedance Value: 399 Ohm
Lead Channel Impedance Value: 456 Ohm
Lead Channel Impedance Value: 513 Ohm
Lead Channel Pacing Threshold Amplitude: 0.625 V
Lead Channel Pacing Threshold Amplitude: 0.625 V
Lead Channel Pacing Threshold Amplitude: 0.75 V
Lead Channel Pacing Threshold Amplitude: 0.75 V
Lead Channel Pacing Threshold Amplitude: 0.75 V
Lead Channel Pacing Threshold Pulse Width: 0.4 ms
Lead Channel Pacing Threshold Pulse Width: 0.4 ms
Lead Channel Pacing Threshold Pulse Width: 0.4 ms
Lead Channel Pacing Threshold Pulse Width: 0.4 ms
Lead Channel Pacing Threshold Pulse Width: 0.4 ms
Lead Channel Sensing Intrinsic Amplitude: 1 mV
Lead Channel Sensing Intrinsic Amplitude: 1.25 mV
Lead Channel Sensing Intrinsic Amplitude: 27.125 mV
Lead Channel Sensing Intrinsic Amplitude: 7.25 mV
Lead Channel Setting Pacing Amplitude: 1.25 V
Lead Channel Setting Pacing Amplitude: 1.5 V
Lead Channel Setting Pacing Amplitude: 2.25 V
Lead Channel Setting Pacing Pulse Width: 0.4 ms
Lead Channel Setting Pacing Pulse Width: 0.4 ms
Lead Channel Setting Sensing Sensitivity: 2.8 mV
Zone Setting Status: 755011
Zone Setting Status: 755011

## 2022-08-19 NOTE — Progress Notes (Signed)
Patient brought in today to program Rate Response. Normal device function. Thresholds, sensing, impedance consistent with previous measurements. Histogram is flat for patient and level of activity. No mode switches or ventricular high rate episodes. Patient bi-ventricularly pacing 99.7% of the time. Device programmed with appropriate safety margins. Optivol is trending upward, patient instructed to monitor weight, use Lasix as indicated and limit his sodium.  Follow up with gen cards if signs of fluid retention.  Estimated longevity 10 years.   Reviewed Rate Response with Dr. Graciela Husbands.  The following program changes made: 1.  DDD to DDDR  2.  Upper Track Rate changed from 130 to 110bpm. 3.  Upper Sensor Rate changed from 120 bpm to 110bpm.  4.  ADL rate decreased from 95bpm to 80bpm.

## 2022-08-19 NOTE — Progress Notes (Signed)
Subjective:   Patient ID: Kyle Avila, male   DOB: 77 y.o.   MRN: [id number]   HPI Patient presents stating he had some bleeding of his right big toenail it is better now he just wanted it checked   ROS      Objective:  Physical Exam  Neurovascular status intact with patient who is on blood thinner who did have bleeding that he had to go to urgent care to be treated with a well-healed surgical site now no bleeding noted     Assessment:  Patient unfortunately did have some bleeding and it did require treatment but it is better now      Plan:  As precautionary measure I went ahead and I did compress the toe and I want him to wear that for several days with a small amount of Lumicain.  If patient should have any issues I want to see him back because should be the end of the problem

## 2022-08-19 NOTE — Patient Instructions (Signed)
We have made some slight changes to your device programming with the hope that this will give you more energy during your exertional periods.   If you feel that your symptoms are not improving or are becoming worse (ie: chest pain, pounding heart beats) please call our office at [phone] or send [place] a my chart message directly.  If any severe, emergent sx's with SOB and/or chest pain please go to the ER.   Thank you.

## 2022-08-20 ENCOUNTER — Encounter: Payer: Self-pay | Admitting: Internal Medicine

## 2022-08-28 DIAGNOSIS — M25531 Pain in right wrist: Secondary | ICD-10-CM | POA: Diagnosis not present

## 2022-09-04 ENCOUNTER — Encounter: Payer: Self-pay | Admitting: Internal Medicine

## 2022-09-04 ENCOUNTER — Encounter: Payer: Self-pay | Admitting: Cardiovascular Disease

## 2022-09-05 ENCOUNTER — Telehealth: Payer: Self-pay | Admitting: Internal Medicine

## 2022-09-05 NOTE — Telephone Encounter (Signed)
Transmission received normal device function:  Patient just had recent rate response programming changes made on 08/19/22  : Reviewed Rate Response with Dr. Graciela Husbands.  The following program changes made: 1.  DDD to DDDR  2.  Upper Track Rate changed from 130 to 110bpm. 3.  Upper Sensor Rate changed from 120 bpm to 110bpm.  4.  ADL rate decreased from 95bpm to 80bpm.

## 2022-09-05 NOTE — Telephone Encounter (Signed)
  1. Has your device fired? no  2. Is you device beeping? no  3. Are you experiencing draining or swelling at device site? no  4. Are you calling to see if we received your device transmission? Having problem sending transmission  5. Have you passed out? no    Please route to Device Clinic Pool

## 2022-09-05 NOTE — Telephone Encounter (Signed)
Called and spoke to the patient.  Pacing parameters show both increased effort over the last 3 weeks plus improved heart rate excursion.  The symptoms have if anything improved somewhat.  Medication changes of late included the introduction of hydralazine and the doubling of his metoprolol.  I suggested to the patient that we decrease metoprolol from 200 back to 100 mg a day.  Kyle Avila he is seeing you on Tuesday.  Thanks

## 2022-09-08 ENCOUNTER — Encounter: Payer: Self-pay | Admitting: Internal Medicine

## 2022-09-09 ENCOUNTER — Encounter: Payer: Self-pay | Admitting: Cardiovascular Disease

## 2022-09-12 ENCOUNTER — Ambulatory Visit: Payer: Medicare HMO | Admitting: Nurse Practitioner

## 2022-09-18 NOTE — Progress Notes (Signed)
Cardiology Office Note:    Date:  09/19/2022  ID:  Kyle Avila, DOB [birth year], MRN [id number] PCP: Chilton Greathouse, MD  [facility] Providers Cardiologist:  Tonny Bollman, MD Cardiology APP:  Beatrice Lecher, PA-C  Electrophysiologist:  Sherryl Manges, MD          Patient Profile:   Coronary artery disease S/p CABG in 1988; redo in 1996 S/p multiple PCI procedures (DES x 2 to LCx in 2009, DES to S-PDA in 2016, DES x 2 to S-PDA in 2018, DES to RPDA and cutting balloon POBA to S-RPDA (ISR) 6/21; POBA to both stented segments of S-PDA in 07/2021) LHC 05/21/2022: LM 100, LAD ostial 100, mid 65 (at anastomosis), distal 40 (after anastomosis); RI ostial 100; LM to LCx stent patent, OM1 100; RCA ostial 100 CTO, RPDA stent 95 ISR; SVG-RPDA stent patent with 30 ISR, 40 ISR; SVG-OM1 100 CTO; LIMA-LAD patent>> med Rx (+ Ranexa) HFmrEF (heart failure with mildly reduced ejection fraction)  Ischemic CM TTE 02/26/2022: EF 45-50, mildly reduced RVSF, RVSP 33.9, mild to moderate MR, AV sclerosis, RAP 3   Persistent atrial fibrillation  S/p DCCV 06/2021 >> ERAF Amiodarone DC'd (Lung toxicity; failed DCCV) Completed course of Prednisone  Dr. Judeth Horn ([place]) Mild to mod mitral regurgitation  Carotid artery disease S/p R CEA Carotid US 04/18/2022: Patent R CEA; L ICA 1-39; R VA stenosis CHB, s/p pacemaker S/p Upgrade to CRT 03/2022 Chronic kidney disease Hypertension Hyperlipidemia         History of Present Illness:   Kyle Avila is a 77 y.o. male who returns for follow-up of CAD, CHF, A-fib.  He was last seen by Dr. Graciela Husbands 08/06/2022.  Dr. Graciela Husbands spoke to the patient 09/05/2022.  Device interrogation demonstrated increased effort over the last 3 weeks plus improved heart rate excursion.  His symptoms had improved.  His metoprolol was changed back from 200 mg to 100 mg daily due to ongoing fatigue.  He is here alone today.  He has a plan to catch in the next hour to go to [place].  He has  not had significant chest pain.  His breathing is stable.  His fatigue is improved but he does not feel back to baseline.  He has not had syncope, orthopnea, edema.  Review of Systems  Gastrointestinal:  Negative for hematochezia and melena.  Genitourinary:  Negative for hematuria.  See the HPI    Studies Reviewed:    EKG: Not done  Risk Assessment/Calculations:   CHA2DS2-VASc Score = 5   This indicates a 7.2% annual risk of stroke. [name] score is based upon: CHF History: 1 HTN History: 1 Diabetes History: 0 Stroke History: 0 Vascular Disease History: 1 Age Score: 2 Gender Score: 0          Physical Exam:   VS:  BP (!) 121/56   Pulse 64   Ht 5\' 9"  (1.753 m)   Wt 168 lb 6.4 oz (76.4 kg)   SpO2 97%   BMI 24.87 kg/m    Wt Readings from Last 3 Encounters:  09/19/22 168 lb 6.4 oz (76.4 kg)  07/28/22 168 lb 12.8 oz (76.6 kg)  06/23/22 172 lb 6.4 oz (78.2 kg)    Constitutional:      Appearance: Healthy appearance. Not in distress.  Neck:     Vascular: JVD normal.  Pulmonary:     Breath sounds: Normal breath sounds. No wheezing. No rales.  Cardiovascular:     Normal  rate. Regular rhythm.     Murmurs: There is a grade 3/6 harsh systolic murmur at the URSB. There is also a grade 2/6 systolic murmur at the LLSB.  Edema:    Peripheral edema present.    Ankle: bilateral trace edema of the ankle. Abdominal:     Palpations: Abdomen is soft.      ASSESSMENT AND PLAN:   HFmrEF (heart failure with mildly reduced EF) EF 45-50 by echocardiogram in November 2023.  NYHA II.  Volume status stable.  He feels much better on the lower dose of metoprolol succinate.  However, he does not quite feel back to baseline.  He does admit that his activity level has been lower.  We discussed increasing his activity level over time to see if this improves his symptoms.  Continue Jardiance 10 mg daily, hydralazine 25 mg 3 times a day, Imdur 120 mg daily, metoprolol succinate 100 mg daily.   Keep follow-up with Dr. Excell Seltzer in August.  CAD (coronary artery disease) History of bypass in 1988 and redo in 1996 and multiple PCI procedures.  Most recent cardiac catheterization in Jan 2024 demonstrated patent left main to LCx stent, patent SVG-RPDA stent, patent LIMA-LAD, known occlusion of the RCA, LAD. There were no targets for PCI and med Rx was continued.  He is doing well without significant symptoms to suggest angina.  Continue amlodipine 5 mg daily, Plavix 75 mg daily, Imdur 120 mg daily, metoprolol succinate 100 mg daily, nitroglycerin as needed chest pain, ranolazine 500 mg twice daily.  Keep follow-up in August as planned.  Hypertension His blood pressure here is optimal.  He has several readings at home in the 130s to 160s.  I have recommended that he monitor his blood pressure by checking it several hours after his medications, after resting for 20 minutes.  Continue amlodipine 5 mg daily, hydralazine 25 mg 3 times a day, Imdur 120 mg daily, metoprolol succinate 100 mg daily.  Moderate mitral regurgitation Moderate by echo in November 2023.  Consider repeat echo again in November 2024.  Complete heart block s/p PPM Status post an upgrade to CRT.  Follow-up with EP as planned.  Hyperlipidemia LDL excellent in December 2023 at 15.  Continue Repatha 140 mg every 14 days.  Persistent atrial fibrillation (HCC) No atrial fibrillation detected on recent device interrogation.  Continue metoprolol succinate 100 mg daily.  His weight is >60 kg and his age is <80.  Continue Eliquis 5 mg twice daily.    Dispo:  Return in about 3 months (around 12/20/2022) for Scheduled Follow Up, w/ Dr. Excell Seltzer.  Signed, Tereso Newcomer, PA-C

## 2022-09-19 ENCOUNTER — Ambulatory Visit: Payer: Medicare HMO | Attending: Nurse Practitioner | Admitting: Physician Assistant

## 2022-09-19 ENCOUNTER — Encounter: Payer: Self-pay | Admitting: Physician Assistant

## 2022-09-19 VITALS — BP 121/56 | HR 64 | Ht 69.0 in | Wt 168.4 lb

## 2022-09-19 DIAGNOSIS — I4819 Other persistent atrial fibrillation: Secondary | ICD-10-CM

## 2022-09-19 DIAGNOSIS — E782 Mixed hyperlipidemia: Secondary | ICD-10-CM | POA: Diagnosis not present

## 2022-09-19 DIAGNOSIS — I442 Atrioventricular block, complete: Secondary | ICD-10-CM | POA: Diagnosis not present

## 2022-09-19 DIAGNOSIS — I340 Nonrheumatic mitral (valve) insufficiency: Secondary | ICD-10-CM

## 2022-09-19 DIAGNOSIS — I5020 Unspecified systolic (congestive) heart failure: Secondary | ICD-10-CM | POA: Diagnosis not present

## 2022-09-19 DIAGNOSIS — I25708 Atherosclerosis of coronary artery bypass graft(s), unspecified, with other forms of angina pectoris: Secondary | ICD-10-CM

## 2022-09-19 DIAGNOSIS — I10 Essential (primary) hypertension: Secondary | ICD-10-CM | POA: Diagnosis not present

## 2022-09-19 NOTE — Assessment & Plan Note (Signed)
History of bypass in 1988 and redo in 1996 and multiple PCI procedures.  Most recent cardiac catheterization in Jan 2024 demonstrated patent left main to LCx stent, patent SVG-RPDA stent, patent LIMA-LAD, known occlusion of the RCA, LAD. There were no targets for PCI and med Rx was continued.  He is doing well without significant symptoms to suggest angina.  Continue amlodipine 5 mg daily, Plavix 75 mg daily, Imdur 120 mg daily, metoprolol succinate 100 mg daily, nitroglycerin as needed chest pain, ranolazine 500 mg twice daily.  Keep follow-up in August as planned.

## 2022-09-19 NOTE — Assessment & Plan Note (Signed)
EF 45-50 by echocardiogram in November 2023.  NYHA II.  Volume status stable.  He feels much better on the lower dose of metoprolol succinate.  However, he does not quite feel back to baseline.  He does admit that his activity level has been lower.  We discussed increasing his activity level over time to see if this improves his symptoms.  Continue Jardiance 10 mg daily, hydralazine 25 mg 3 times a day, Imdur 120 mg daily, metoprolol succinate 100 mg daily.  Keep follow-up with Dr. Excell Seltzer in August.

## 2022-09-19 NOTE — Assessment & Plan Note (Signed)
LDL excellent in December 2023 at 15.  Continue Repatha 140 mg every 14 days.

## 2022-09-19 NOTE — Assessment & Plan Note (Signed)
Moderate by echo in November 2023.  Consider repeat echo again in November 2024.

## 2022-09-19 NOTE — Assessment & Plan Note (Signed)
Status post an upgrade to CRT.  Follow-up with EP as planned.

## 2022-09-19 NOTE — Patient Instructions (Signed)
Medication Instructions:  Your physician recommends that you continue on your current medications as directed. Please refer to the Current Medication list given to you today.  *If you need a refill on your cardiac medications before your next appointment, please call your pharmacy*   Follow-Up: At [facility], you and your health needs are our priority.  As part of our continuing mission to provide you with exceptional heart care, we have created designated Provider Care Teams.  These Care Teams include your primary Cardiologist (physician) and Advanced Practice Providers (APPs -  Physician Assistants and Nurse Practitioners) who all work together to provide you with the care you need, when you need it.   Your next appointment:   As scheduled   Provider:   Tonny Bollman, MD

## 2022-09-19 NOTE — Assessment & Plan Note (Signed)
No atrial fibrillation detected on recent device interrogation.  Continue metoprolol succinate 100 mg daily.  His weight is >60 kg and his age is <80.  Continue Eliquis 5 mg twice daily.

## 2022-09-19 NOTE — Assessment & Plan Note (Signed)
His blood pressure here is optimal.  He has several readings at home in the 130s to 160s.  I have recommended that he monitor his blood pressure by checking it several hours after his medications, after resting for 20 minutes.  Continue amlodipine 5 mg daily, hydralazine 25 mg 3 times a day, Imdur 120 mg daily, metoprolol succinate 100 mg daily.

## 2022-09-23 ENCOUNTER — Ambulatory Visit (INDEPENDENT_AMBULATORY_CARE_PROVIDER_SITE_OTHER): Payer: Medicare HMO

## 2022-09-23 DIAGNOSIS — I442 Atrioventricular block, complete: Secondary | ICD-10-CM | POA: Diagnosis not present

## 2022-09-24 LAB — CUP PACEART REMOTE DEVICE CHECK
Battery Remaining Longevity: 125 mo
Battery Voltage: 3.1 V
Brady Statistic AP VP Percent: 93.91 %
Brady Statistic AP VS Percent: 0.27 %
Brady Statistic AS VP Percent: 5.41 %
Brady Statistic AS VS Percent: 0.41 %
Brady Statistic RA Percent Paced: 94.35 %
Brady Statistic RV Percent Paced: 99.31 %
Date Time Interrogation Session: 20240527225700
Implantable Lead Connection Status: 753985
Implantable Lead Connection Status: 753985
Implantable Lead Connection Status: 753985
Implantable Lead Implant Date: 20070831
Implantable Lead Implant Date: 20070831
Implantable Lead Implant Date: 20231227
Implantable Lead Location: 753858
Implantable Lead Location: 753859
Implantable Lead Location: 753860
Implantable Lead Model: 3830
Implantable Lead Model: 4469
Implantable Lead Model: 4470
Implantable Lead Serial Number: 485949
Implantable Lead Serial Number: 553665
Implantable Pulse Generator Implant Date: 20231227
Lead Channel Impedance Value: 228 Ohm
Lead Channel Impedance Value: 266 Ohm
Lead Channel Impedance Value: 304 Ohm
Lead Channel Impedance Value: 304 Ohm
Lead Channel Impedance Value: 342 Ohm
Lead Channel Impedance Value: 342 Ohm
Lead Channel Impedance Value: 380 Ohm
Lead Channel Impedance Value: 437 Ohm
Lead Channel Impedance Value: 494 Ohm
Lead Channel Pacing Threshold Amplitude: 0.625 V
Lead Channel Pacing Threshold Amplitude: 0.625 V
Lead Channel Pacing Threshold Amplitude: 0.75 V
Lead Channel Pacing Threshold Pulse Width: 0.4 ms
Lead Channel Pacing Threshold Pulse Width: 0.4 ms
Lead Channel Pacing Threshold Pulse Width: 0.4 ms
Lead Channel Sensing Intrinsic Amplitude: 1.25 mV
Lead Channel Sensing Intrinsic Amplitude: 1.25 mV
Lead Channel Sensing Intrinsic Amplitude: 27.125 mV
Lead Channel Sensing Intrinsic Amplitude: 7.25 mV
Lead Channel Setting Pacing Amplitude: 1.25 V
Lead Channel Setting Pacing Amplitude: 1.5 V
Lead Channel Setting Pacing Amplitude: 2 V
Lead Channel Setting Pacing Pulse Width: 0.4 ms
Lead Channel Setting Pacing Pulse Width: 0.4 ms
Lead Channel Setting Sensing Sensitivity: 2.8 mV
Zone Setting Status: 755011
Zone Setting Status: 755011

## 2022-09-26 ENCOUNTER — Encounter: Payer: Self-pay | Admitting: Internal Medicine

## 2022-09-26 ENCOUNTER — Encounter: Payer: Self-pay | Admitting: Cardiovascular Disease

## 2022-09-30 ENCOUNTER — Other Ambulatory Visit (HOSPITAL_COMMUNITY): Payer: Self-pay

## 2022-09-30 ENCOUNTER — Telehealth (HOSPITAL_COMMUNITY): Payer: Self-pay

## 2022-09-30 ENCOUNTER — Encounter: Payer: Self-pay | Admitting: Cardiovascular Disease

## 2022-09-30 ENCOUNTER — Other Ambulatory Visit: Payer: Self-pay | Admitting: Cardiovascular Disease

## 2022-09-30 NOTE — Telephone Encounter (Signed)
Advanced Heart Failure Patient Advocate Encounter  The patient was approved for a [employer] grant that will help cover the cost of Jardiance, Metoprolol.  Total amount awarded, $10,000.  Effective: 08/31/2022 - 08/30/2023.  BIN [id number] PCN [id number] Group [id number] ID [id number]  Processing information provided to [facility], patient informed by phone.  Burnell Blanks, CPhT Rx Patient Advocate Phone: [phone]

## 2022-10-01 DIAGNOSIS — H353211 Exudative age-related macular degeneration, right eye, with active choroidal neovascularization: Secondary | ICD-10-CM | POA: Diagnosis not present

## 2022-10-10 DIAGNOSIS — I340 Nonrheumatic mitral (valve) insufficiency: Secondary | ICD-10-CM | POA: Diagnosis not present

## 2022-10-10 DIAGNOSIS — N184 Chronic kidney disease, stage 4 (severe): Secondary | ICD-10-CM | POA: Diagnosis not present

## 2022-10-10 DIAGNOSIS — R5383 Other fatigue: Secondary | ICD-10-CM | POA: Diagnosis not present

## 2022-10-10 DIAGNOSIS — I480 Paroxysmal atrial fibrillation: Secondary | ICD-10-CM | POA: Diagnosis not present

## 2022-10-10 DIAGNOSIS — I25118 Atherosclerotic heart disease of native coronary artery with other forms of angina pectoris: Secondary | ICD-10-CM | POA: Diagnosis not present

## 2022-10-10 DIAGNOSIS — I5022 Chronic systolic (congestive) heart failure: Secondary | ICD-10-CM | POA: Diagnosis not present

## 2022-10-10 DIAGNOSIS — Z950 Presence of cardiac pacemaker: Secondary | ICD-10-CM | POA: Diagnosis not present

## 2022-10-10 DIAGNOSIS — R059 Cough, unspecified: Secondary | ICD-10-CM | POA: Diagnosis not present

## 2022-10-10 DIAGNOSIS — I130 Hypertensive heart and chronic kidney disease with heart failure and stage 1 through stage 4 chronic kidney disease, or unspecified chronic kidney disease: Secondary | ICD-10-CM | POA: Diagnosis not present

## 2022-10-13 ENCOUNTER — Encounter: Payer: Self-pay | Admitting: Cardiovascular Disease

## 2022-10-13 MED ORDER — REPATHA SURECLICK 140 MG/ML ~~LOC~~ SOAJ: 140.0000 mg | SUBCUTANEOUS | 3 refills | Status: DC

## 2022-10-20 NOTE — Progress Notes (Signed)
Remote pacemaker transmission.   

## 2022-10-22 DIAGNOSIS — I130 Hypertensive heart and chronic kidney disease with heart failure and stage 1 through stage 4 chronic kidney disease, or unspecified chronic kidney disease: Secondary | ICD-10-CM | POA: Diagnosis not present

## 2022-10-22 DIAGNOSIS — R809 Proteinuria, unspecified: Secondary | ICD-10-CM | POA: Diagnosis not present

## 2022-10-22 DIAGNOSIS — E875 Hyperkalemia: Secondary | ICD-10-CM | POA: Diagnosis not present

## 2022-10-22 DIAGNOSIS — N2581 Secondary hyperparathyroidism of renal origin: Secondary | ICD-10-CM | POA: Diagnosis not present

## 2022-10-22 DIAGNOSIS — I5040 Unspecified combined systolic (congestive) and diastolic (congestive) heart failure: Secondary | ICD-10-CM | POA: Diagnosis not present

## 2022-10-22 DIAGNOSIS — R0902 Hypoxemia: Secondary | ICD-10-CM | POA: Diagnosis not present

## 2022-10-22 DIAGNOSIS — I4811 Longstanding persistent atrial fibrillation: Secondary | ICD-10-CM | POA: Diagnosis not present

## 2022-10-22 DIAGNOSIS — N184 Chronic kidney disease, stage 4 (severe): Secondary | ICD-10-CM | POA: Diagnosis not present

## 2022-10-23 LAB — LAB REPORT - SCANNED
Creatinine, POC: 50.7 mg/dL
EGFR: 25

## 2022-10-27 ENCOUNTER — Encounter: Payer: Self-pay | Admitting: Cardiovascular Disease

## 2022-11-04 ENCOUNTER — Encounter: Payer: Self-pay | Admitting: Internal Medicine

## 2022-11-04 ENCOUNTER — Ambulatory Visit: Payer: Medicare HMO | Attending: Internal Medicine | Admitting: Internal Medicine

## 2022-11-04 VITALS — BP 112/64 | HR 62 | Ht 69.0 in | Wt 165.4 lb

## 2022-11-04 DIAGNOSIS — Z950 Presence of cardiac pacemaker: Secondary | ICD-10-CM | POA: Diagnosis not present

## 2022-11-04 LAB — CUP PACEART INCLINIC DEVICE CHECK
Date Time Interrogation Session: 20240709170445
Implantable Lead Connection Status: 753985
Implantable Lead Connection Status: 753985
Implantable Lead Connection Status: 753985
Implantable Lead Implant Date: 20070831
Implantable Lead Implant Date: 20070831
Implantable Lead Implant Date: 20231227
Implantable Lead Location: 753858
Implantable Lead Location: 753859
Implantable Lead Location: 753860
Implantable Lead Model: 3830
Implantable Lead Model: 4469
Implantable Lead Model: 4470
Implantable Lead Serial Number: 485949
Implantable Lead Serial Number: 553665
Implantable Pulse Generator Implant Date: 20231227

## 2022-11-04 NOTE — Progress Notes (Signed)
Patient Care Team: Chilton Greathouse, MD as PCP - General (Internal Medicine) Tonny Bollman, MD as PCP - Cardiology (Cardiology) Duke Salvia, MD as PCP - Electrophysiology (Cardiology) Primitivo Gauze, MD as Consulting Physician (Nephrology) Duke Salvia, MD (Cardiology) Kennon Rounds as Physician Assistant (Cardiology)   HPI  Kyle Avila is a 77 y.o. 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  His device reached ERI and he underwent generator replacement 5/16& 12/23 w LBBpacing upgrade.  Long term persistent atrial fibrillation-- sometime recently, he spontaneously reverted to sinus rhythm and has been programmed DDD and has significant exercise intolerance.  On coming to the office for reprogramming he had reverted back to atrial fibrillation.  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     Complex cardiovascular history with bypass surgery and redo bypass in 96 and revascularization with stenting of the circumflex in 2009.     Post upgrade developed progressive chest pain>>now ranolazine with improvement; rate response was activated in April with improvement.  No angina  Is able to walk but has to be quite restrained in his vigor.  Has been told by nephrology that he needs to be thinking about dialysis    .   Date Cr K Hgb  1/24 2.52 4.7 14.7             DATE TEST EF   11/15 Myoview  42 % Anterolateral defect  2/18 Echo   45-50 %   12/18 Cath   S/P CABG patency  LIMA-LAD and moderate stenosis at   anastomosis Stenosis of the SVG-PDA- DESX2  implantation In-stent restenosis and de-novo mid vessel disease  11/23 Echo  45-50% MR mild Mod  1/24 LHC  Continued patency  Med Rx      Past Medical History:  Diagnosis Date   Arthritis    "some in my back" (04/14/2017)   CAD (coronary artery disease)    a.  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; lupus attacked them" (04/14/2017)   Mitral insufficiency    Moderate mitral regurgitation 10/02/2021   Echocardiogram 6/23: EF 45, global HK, mild ASH, mildly reduced RVSF, normal PASP, RVSP 31, mod LAD, mild RAE, mod MR, trivial AI, AV sclerosis w/o stenosis   Myocardial infarction (HCC)    "I've had some mild one" (04/14/2017)   Presence of permanent cardiac pacemaker    Skin cancer    "nose"   VT (ventricular tachycardia)-nonsustained 05/20/2011    Past Surgical History:  Procedure Laterality Date   ACHILLES TENDON REPAIR Right    BIV UPGRADE N/A 04/23/2022   Procedure: BIV PPM UPGRADE;  Surgeon: Duke Salvia, MD;  Location: [facility] INVASIVE CV LAB;  Service: Cardiovascular;  Laterality: N/A;   CARDIAC CATHETERIZATION     CARDIAC CATHETERIZATION N/A 11/03/2014   Procedure: Left Heart Cath and Cors/Grafts Angiography;  Surgeon: Tonny Bollman, MD;  Location: [facility] INVASIVE CV LAB;  Service: Cardiovascular;  Laterality: N/A;   CARDIAC CATHETERIZATION N/A 11/03/2014   Procedure: Coronary Stent Intervention;  Surgeon: Tonny Bollman, MD;  Location: [facility] INVASIVE CV  LAB;  Service: Cardiovascular;  Laterality: N/A;   CARDIOVERSION N/A 07/17/2021   Procedure: CARDIOVERSION;  Surgeon: Maisie Fus, MD;  Location: [facility] ENDOSCOPY;  Service: Cardiovascular;  Laterality: N/A;   CARDIOVERSION N/A 10/08/2021   Procedure: CARDIOVERSION;  Surgeon: Meriam Sprague, MD;  Location: [facility] ENDOSCOPY;  Service: Cardiovascular;  Laterality: N/A;   CAROTID ENDARTERECTOMY  2002   "? side"   CATARACT EXTRACTION W/ INTRAOCULAR LENS IMPLANT     "? side"   CORONARY ANGIOPLASTY WITH STENT PLACEMENT  04/14/2017   CORONARY ANGIOPLASTY WITH STENT PLACEMENT  11/03/2014   SVG PVA   CORONARY ARTERY BYPASS GRAFT      with redo cabg   CORONARY BALLOON ANGIOPLASTY N/A 08/15/2021   Procedure: CORONARY BALLOON ANGIOPLASTY;  Surgeon: Kathleene Hazel, MD;  Location: [facility] INVASIVE CV LAB;  Service: Cardiovascular;  Laterality: N/A;   CORONARY STENT INTERVENTION N/A 10/18/2019   Procedure: CORONARY STENT INTERVENTION;  Surgeon: Tonny Bollman, MD;  Location: [facility] INVASIVE CV LAB;  Service: Cardiovascular;  Laterality: N/A;   CORONARY/GRAFT ANGIOGRAPHY N/A 10/18/2019   Procedure: CORONARY/GRAFT ANGIOGRAPHY;  Surgeon: Tonny Bollman, MD;  Location: [facility] INVASIVE CV LAB;  Service: Cardiovascular;  Laterality: N/A;   EP IMPLANTABLE DEVICE N/A 09/13/2014   Procedure: PPM Generator Changeout;  Surgeon: Duke Salvia, MD;  Location: [facility] INVASIVE CV LAB;  Service: Cardiovascular;  Laterality: N/A;   LEFT HEART CATH AND CORS/GRAFTS ANGIOGRAPHY N/A 04/14/2017   Procedure: LEFT HEART CATH AND CORS/GRAFTS ANGIOGRAPHY;  Surgeon: Tonny Bollman, MD;  Location: [facility] INVASIVE CV LAB;  Service: Cardiovascular;  Laterality: N/A;   LEFT HEART CATH AND CORS/GRAFTS ANGIOGRAPHY N/A 08/15/2021   Procedure: LEFT HEART CATH AND CORS/GRAFTS ANGIOGRAPHY;  Surgeon: Kathleene Hazel, MD;  Location: [facility] INVASIVE CV LAB;  Service: Cardiovascular;  Laterality: N/A;   LEFT HEART CATH AND CORS/GRAFTS ANGIOGRAPHY N/A 05/21/2022   Procedure: LEFT HEART CATH AND CORS/GRAFTS ANGIOGRAPHY;  Surgeon: Tonny Bollman, MD;  Location: [facility] INVASIVE CV LAB;  Service: Cardiovascular;  Laterality: N/A;   MOHS SURGERY     "just outside my nose"   PACEMAKER INSERTION  2007   TONSILLECTOMY  1958    Current Outpatient Medications  Medication Sig Dispense Refill   acetaminophen (TYLENOL) 500 MG tablet Take 1,000 mg by mouth every 6 (six) hours as needed for moderate pain.     Aflibercept (EYLEA) 2 MG/0.05ML SOLN by Intravitreal route.     allopurinol (ZYLOPRIM) 100 MG tablet Take 100 mg by mouth at bedtime.     amLODipine (NORVASC) 5 MG tablet Take 5 mg by  mouth daily.     apixaban (ELIQUIS) 5 MG TABS tablet TAKE 1 TABLET TWICE DAILY 180 tablet 1   azelastine (ASTELIN) 0.1 % nasal spray Place 1 spray into both nostrils 2 (two) times daily. Use in each nostril as directed     clopidogrel (PLAVIX) 75 MG tablet Take 1 tablet (75 mg total) by mouth at bedtime. 90 tablet 3   empagliflozin (JARDIANCE) 10 MG TABS tablet Take 1 tablet (10 mg total) by mouth daily before breakfast. 90 tablet 3   Evolocumab (REPATHA SURECLICK) 140 MG/ML SOAJ Inject 140 mg into the skin every 14 (fourteen) days. 6 mL 3   furosemide (LASIX) 80 MG tablet Take 1 tablet (80 mg total) by mouth daily. 90 tablet 3   hydrALAZINE (APRESOLINE) 25 MG tablet Take 1 tablet (25 mg total) by mouth 3 (three) times daily. 270 tablet 3   isosorbide mononitrate (IMDUR) 120 MG  24 hr tablet TAKE 1 TABLET (120 MG TOTAL) BY MOUTH DAILY. 90 tablet 3   ketoconazole (NIZORAL) 2 % cream Apply 1 application. topically daily as needed (itching toes).     metoprolol succinate (TOPROL-XL) 100 MG 24 hr tablet Take 1 tablet (100 mg total) by mouth daily. Take with or immediately following a meal. 90 tablet 3   nitroGLYCERIN (NITROSTAT) 0.4 MG SL tablet PLACE 1 TABLET UNDER THE TONGUE EVERY 5 MINUTES AS NEEDED FOR CHEST PAIN 75 tablet 2   ranolazine (RANEXA) 500 MG 12 hr tablet Take 1 tablet (500 mg total) by mouth 2 (two) times daily. 180 tablet 3   tamsulosin (FLOMAX) 0.4 MG CAPS Take 0.4 mg by mouth daily.     No current facility-administered medications for this visit.    Allergies  Allergen Reactions   Amiodarone Other (See Comments)    Lung toxicity     Review of Systems negative except from HPI and PMH  Physical Exam BP 112/64   Pulse 62   Ht 5\' 9"  (1.753 m)   Wt 165 lb 6.4 oz (75 kg)   BMI 24.43 kg/m    Well developed and well nourished in no acute distress HENT normal Neck supple with JVP-flat Clear Device pocket well healed; without hematoma or erythema.  There is no tethering   Regular rate and rhythm, no  gallop 2/6 murmur Abd-soft with active BS No Clubbing cyanosis  edema Skin-warm and dry A & Oriented  Grossly normal sensory and motor function  ECG AV pacing at 62 20/16/48 Upright QRS lead I QR lead V1 QRSd pre-op grade 186  Device function is  normal. Programming changes none, we tried various LV offsets without any improvement in QRSd See Paceart for details    Device function is normal. Programming changes   See Paceart for details    Assessment and  Plan  Ischemic cardiomyopathy with prior MI  Hypertension   Pacemaker-[employer]    Complete heart block device dependent  Renal insufficiency grade 4  PVCs  Sinus node dysfunction  Atrial fibrillation-persistent with spontaneous reversion  Amiodarone question lung toxicity  Still with exercise limitations although better with activation of rate response in April ongoing ranolazine and is without ischemia.    Continue metoprolol  Atrial fibrillation has been thought to be permanent.  Spontaneously reverted.  Has been holding sinus rhythm despite discontinuation of amiodarone.  Somewhat surprisingly.  Continue anticoagulation with apixaban 5 mg twice daily

## 2022-11-04 NOTE — Patient Instructions (Signed)
Medication Instructions:  Your physician recommends that you continue on your current medications as directed. Please refer to the Current Medication list given to you today.  *If you need a refill on your cardiac medications before your next appointment, please call your pharmacy*  Follow-Up: At [facility], you and your health needs are our priority.  As part of our continuing mission to provide you with exceptional heart care, we have created designated Provider Care Teams.  These Care Teams include your primary Cardiologist (physician) and Advanced Practice Providers (APPs -  Physician Assistants and Nurse Practitioners) who all work together to provide you with the care you need, when you need it.  Your next appointment:   1 year(s)  Provider:   You may see Sherryl Manges, MD or one of the following Advanced Practice Providers on your designated Care Team:   Francis Dowse, [place] [address] [place], [place] Sherie Don, NP Canary Brim, NP

## 2022-11-05 DIAGNOSIS — H35363 Drusen (degenerative) of macula, bilateral: Secondary | ICD-10-CM | POA: Diagnosis not present

## 2022-11-05 DIAGNOSIS — Z961 Presence of intraocular lens: Secondary | ICD-10-CM | POA: Diagnosis not present

## 2022-11-05 DIAGNOSIS — H35722 Serous detachment of retinal pigment epithelium, left eye: Secondary | ICD-10-CM | POA: Diagnosis not present

## 2022-11-05 DIAGNOSIS — H353211 Exudative age-related macular degeneration, right eye, with active choroidal neovascularization: Secondary | ICD-10-CM | POA: Diagnosis not present

## 2022-11-05 DIAGNOSIS — H35453 Secondary pigmentary degeneration, bilateral: Secondary | ICD-10-CM | POA: Diagnosis not present

## 2022-11-05 DIAGNOSIS — H353121 Nonexudative age-related macular degeneration, left eye, early dry stage: Secondary | ICD-10-CM | POA: Diagnosis not present

## 2022-11-10 ENCOUNTER — Encounter: Payer: Self-pay | Admitting: Cardiovascular Disease

## 2022-11-19 DIAGNOSIS — H353211 Exudative age-related macular degeneration, right eye, with active choroidal neovascularization: Secondary | ICD-10-CM | POA: Diagnosis not present

## 2022-11-28 ENCOUNTER — Other Ambulatory Visit: Payer: Self-pay | Admitting: Physician Assistant

## 2022-11-28 DIAGNOSIS — I25119 Atherosclerotic heart disease of native coronary artery with unspecified angina pectoris: Secondary | ICD-10-CM

## 2022-11-28 DIAGNOSIS — E875 Hyperkalemia: Secondary | ICD-10-CM | POA: Diagnosis not present

## 2022-11-28 DIAGNOSIS — I5040 Unspecified combined systolic (congestive) and diastolic (congestive) heart failure: Secondary | ICD-10-CM | POA: Diagnosis not present

## 2022-11-28 DIAGNOSIS — I130 Hypertensive heart and chronic kidney disease with heart failure and stage 1 through stage 4 chronic kidney disease, or unspecified chronic kidney disease: Secondary | ICD-10-CM | POA: Diagnosis not present

## 2022-11-28 DIAGNOSIS — N184 Chronic kidney disease, stage 4 (severe): Secondary | ICD-10-CM | POA: Diagnosis not present

## 2022-11-28 MED ORDER — AMLODIPINE BESYLATE 5 MG PO TABS: 5.0000 mg | ORAL_TABLET | Freq: Every day | ORAL | 2 refills | Status: DC

## 2022-11-28 NOTE — Telephone Encounter (Signed)
Pt requesting refill on medication not prescribed by Dr Excell Seltzer. Please address. Thank you.

## 2022-12-01 ENCOUNTER — Ambulatory Visit: Payer: Medicare HMO | Admitting: Cardiovascular Disease

## 2022-12-03 ENCOUNTER — Encounter: Payer: Self-pay | Admitting: Cardiovascular Disease

## 2022-12-04 DIAGNOSIS — Z79899 Other long term (current) drug therapy: Secondary | ICD-10-CM | POA: Diagnosis not present

## 2022-12-04 DIAGNOSIS — I25118 Atherosclerotic heart disease of native coronary artery with other forms of angina pectoris: Secondary | ICD-10-CM | POA: Diagnosis not present

## 2022-12-04 DIAGNOSIS — N184 Chronic kidney disease, stage 4 (severe): Secondary | ICD-10-CM | POA: Diagnosis not present

## 2022-12-04 DIAGNOSIS — N62 Hypertrophy of breast: Secondary | ICD-10-CM | POA: Diagnosis not present

## 2022-12-04 DIAGNOSIS — I130 Hypertensive heart and chronic kidney disease with heart failure and stage 1 through stage 4 chronic kidney disease, or unspecified chronic kidney disease: Secondary | ICD-10-CM | POA: Diagnosis not present

## 2022-12-09 ENCOUNTER — Encounter: Payer: Self-pay | Admitting: Cardiovascular Disease

## 2022-12-09 DIAGNOSIS — E782 Mixed hyperlipidemia: Secondary | ICD-10-CM

## 2022-12-09 DIAGNOSIS — N184 Chronic kidney disease, stage 4 (severe): Secondary | ICD-10-CM

## 2022-12-09 DIAGNOSIS — I340 Nonrheumatic mitral (valve) insufficiency: Secondary | ICD-10-CM

## 2022-12-09 DIAGNOSIS — I5020 Unspecified systolic (congestive) heart failure: Secondary | ICD-10-CM

## 2022-12-09 DIAGNOSIS — I25119 Atherosclerotic heart disease of native coronary artery with unspecified angina pectoris: Secondary | ICD-10-CM

## 2022-12-15 ENCOUNTER — Encounter: Payer: Self-pay | Admitting: Cardiovascular Disease

## 2022-12-15 ENCOUNTER — Ambulatory Visit: Payer: Medicare HMO | Admitting: Cardiovascular Disease

## 2022-12-15 MED ORDER — RANOLAZINE ER 500 MG PO TB12: 500.0000 mg | ORAL_TABLET | Freq: Two times a day (BID) | ORAL | 0 refills | Status: DC

## 2022-12-22 ENCOUNTER — Encounter: Payer: Self-pay | Admitting: Cardiovascular Disease

## 2022-12-22 ENCOUNTER — Ambulatory Visit (INDEPENDENT_AMBULATORY_CARE_PROVIDER_SITE_OTHER): Payer: Medicare HMO

## 2022-12-22 DIAGNOSIS — I482 Chronic atrial fibrillation, unspecified: Secondary | ICD-10-CM | POA: Diagnosis not present

## 2022-12-22 DIAGNOSIS — I442 Atrioventricular block, complete: Secondary | ICD-10-CM

## 2022-12-22 DIAGNOSIS — Z86010 Personal history of colonic polyps: Secondary | ICD-10-CM | POA: Diagnosis not present

## 2022-12-22 LAB — CUP PACEART REMOTE DEVICE CHECK
Battery Remaining Longevity: 114 mo
Battery Voltage: 3.03 V
Brady Statistic AP VP Percent: 90.3 %
Brady Statistic AP VS Percent: 0.14 %
Brady Statistic AS VP Percent: 9.14 %
Brady Statistic AS VS Percent: 0.42 %
Brady Statistic RA Percent Paced: 88.82 %
Brady Statistic RV Percent Paced: 99.39 %
Date Time Interrogation Session: 20240826071059
Implantable Lead Connection Status: 753985
Implantable Lead Connection Status: 753985
Implantable Lead Connection Status: 753985
Implantable Lead Implant Date: 20070831
Implantable Lead Implant Date: 20070831
Implantable Lead Implant Date: 20231227
Implantable Lead Location: 753858
Implantable Lead Location: 753859
Implantable Lead Location: 753860
Implantable Lead Model: 3830
Implantable Lead Model: 4469
Implantable Lead Model: 4470
Implantable Lead Serial Number: 485949
Implantable Lead Serial Number: 553665
Implantable Pulse Generator Implant Date: 20231227
Lead Channel Impedance Value: 228 Ohm
Lead Channel Impedance Value: 285 Ohm
Lead Channel Impedance Value: 304 Ohm
Lead Channel Impedance Value: 304 Ohm
Lead Channel Impedance Value: 342 Ohm
Lead Channel Impedance Value: 361 Ohm
Lead Channel Impedance Value: 380 Ohm
Lead Channel Impedance Value: 437 Ohm
Lead Channel Impedance Value: 494 Ohm
Lead Channel Pacing Threshold Amplitude: 0.625 V
Lead Channel Pacing Threshold Amplitude: 0.75 V
Lead Channel Pacing Threshold Amplitude: 0.75 V
Lead Channel Pacing Threshold Pulse Width: 0.4 ms
Lead Channel Pacing Threshold Pulse Width: 0.4 ms
Lead Channel Pacing Threshold Pulse Width: 0.4 ms
Lead Channel Sensing Intrinsic Amplitude: 1.125 mV
Lead Channel Sensing Intrinsic Amplitude: 1.125 mV
Lead Channel Sensing Intrinsic Amplitude: 27.125 mV
Lead Channel Sensing Intrinsic Amplitude: 7.25 mV
Lead Channel Setting Pacing Amplitude: 1.25 V
Lead Channel Setting Pacing Amplitude: 1.5 V
Lead Channel Setting Pacing Amplitude: 2.25 V
Lead Channel Setting Pacing Pulse Width: 0.4 ms
Lead Channel Setting Pacing Pulse Width: 0.4 ms
Lead Channel Setting Sensing Sensitivity: 2.8 mV
Zone Setting Status: 755011
Zone Setting Status: 755011

## 2022-12-26 ENCOUNTER — Ambulatory Visit (HOSPITAL_COMMUNITY)
Admission: RE | Admit: 2022-12-26 | Discharge: 2022-12-26 | Disposition: A | Discharge status: Home / Self Care | Payer: Medicare HMO | Source: Ambulatory Visit | Attending: Cardiology | Admitting: Cardiology

## 2022-12-26 ENCOUNTER — Encounter (HOSPITAL_COMMUNITY): Payer: Self-pay | Admitting: Cardiology

## 2022-12-26 VITALS — BP 120/60 | HR 62 | Wt 163.6 lb

## 2022-12-26 DIAGNOSIS — Z8249 Family history of ischemic heart disease and other diseases of the circulatory system: Secondary | ICD-10-CM | POA: Insufficient documentation

## 2022-12-26 DIAGNOSIS — Z955 Presence of coronary angioplasty implant and graft: Secondary | ICD-10-CM | POA: Insufficient documentation

## 2022-12-26 DIAGNOSIS — E785 Hyperlipidemia, unspecified: Secondary | ICD-10-CM | POA: Diagnosis not present

## 2022-12-26 DIAGNOSIS — I442 Atrioventricular block, complete: Secondary | ICD-10-CM

## 2022-12-26 DIAGNOSIS — I130 Hypertensive heart and chronic kidney disease with heart failure and stage 1 through stage 4 chronic kidney disease, or unspecified chronic kidney disease: Secondary | ICD-10-CM | POA: Diagnosis not present

## 2022-12-26 DIAGNOSIS — R5383 Other fatigue: Secondary | ICD-10-CM | POA: Insufficient documentation

## 2022-12-26 DIAGNOSIS — R0609 Other forms of dyspnea: Secondary | ICD-10-CM | POA: Insufficient documentation

## 2022-12-26 DIAGNOSIS — I25119 Atherosclerotic heart disease of native coronary artery with unspecified angina pectoris: Secondary | ICD-10-CM

## 2022-12-26 DIAGNOSIS — Z951 Presence of aortocoronary bypass graft: Secondary | ICD-10-CM | POA: Insufficient documentation

## 2022-12-26 DIAGNOSIS — I5020 Unspecified systolic (congestive) heart failure: Secondary | ICD-10-CM

## 2022-12-26 DIAGNOSIS — Z950 Presence of cardiac pacemaker: Secondary | ICD-10-CM | POA: Insufficient documentation

## 2022-12-26 DIAGNOSIS — N184 Chronic kidney disease, stage 4 (severe): Secondary | ICD-10-CM | POA: Diagnosis not present

## 2022-12-26 DIAGNOSIS — I4819 Other persistent atrial fibrillation: Secondary | ICD-10-CM | POA: Diagnosis not present

## 2022-12-26 DIAGNOSIS — I2510 Atherosclerotic heart disease of native coronary artery without angina pectoris: Secondary | ICD-10-CM | POA: Diagnosis not present

## 2022-12-26 DIAGNOSIS — I5022 Chronic systolic (congestive) heart failure: Secondary | ICD-10-CM | POA: Insufficient documentation

## 2022-12-26 DIAGNOSIS — Z7984 Long term (current) use of oral hypoglycemic drugs: Secondary | ICD-10-CM | POA: Diagnosis not present

## 2022-12-26 DIAGNOSIS — I255 Ischemic cardiomyopathy: Secondary | ICD-10-CM | POA: Diagnosis present

## 2022-12-26 LAB — BASIC METABOLIC PANEL
Anion gap: 11 (ref 5–15)
BUN: 44 mg/dL — ABNORMAL HIGH (ref 8–23)
CO2: 26 mmol/L (ref 22–32)
Calcium: 9 mg/dL (ref 8.9–10.3)
Chloride: 102 mmol/L (ref 98–111)
Creatinine, Ser: 2.72 mg/dL — ABNORMAL HIGH (ref 0.61–1.24)
GFR, Estimated: 23 mL/min — ABNORMAL LOW (ref >60)
Glucose, Bld: 122 mg/dL — ABNORMAL HIGH (ref 70–99)
Potassium: 4.3 mmol/L (ref 3.5–5.1)
Sodium: 139 mmol/L (ref 135–145)

## 2022-12-26 LAB — BRAIN NATRIURETIC PEPTIDE: B Natriuretic Peptide: 407 pg/mL — ABNORMAL HIGH (ref 0.0–100.0)

## 2022-12-26 NOTE — Progress Notes (Signed)
ADVANCED HEART FAILURE CLINIC NOTE  Referring Physician: Chilton Greathouse, MD  Primary Care: Chilton Greathouse, MD Primary Cardiologist:  HPI: Kyle Avila is a 77 y.o. 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   Kyle Avila has been followed by Dr. Excell Seltzer and Dr. Graciela Husbands for several years now. He has undergone numerous interventions for CAD and vascular disease as noted above. Despite this he continues to have significantly worsening functional status. According to Kyle Avila, he was skiing over a year ago but now does not believe he can partake in activities requiring significant physical exertion due to fatigue and dyspnea. 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   Previous appt:  Since her last appointment Kyle Avila, has had upgrade to his dual-chamber pacemaker to a biventricular pacemaker due to chronic RV pacing and atrial fibrillation.  Due to severe unstable angina he also underwent left heart cath several days ago by Dr. Excell Seltzer that showed stable severe coronary artery disease.  He was started on Ranexa after that.  Since starting Ranexa roughly 5 days ago he has had significant improvement in chest pressure; walked quarter mile w/o significant difficulty. No longer having PND.   Interval hx:  He continues to feel very fatigued. He's not having much chest pressure now on Ranexa 500mg  BID, however, wishes that he had more energy. He continues to take lasix 80mg  daily; daily weights remain around 155-157lbs  Activity  level/exercise tolerance:  NYHA 2B Orthopnea:  Sleeps on 2 pillows Paroxysmal noctural dyspnea: No, resolved Chest pain/pressure: No, resolved since starting Ranexa Orthostatic lightheadedness:  no Palpitations:  no Lower extremity edema: 1-2+ on exam today. Presyncope/syncope:  no Cough:  no  Past Medical History:  Diagnosis Date   Arthritis    "some in my back" (04/14/2017)   CAD (coronary artery disease)    a.  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HCC)    s/p PTVDP   Gout    HTN (hypertension)    Hyperlipidemia    Lupus (HCC)    "that's why my kidneys are gone; lupus attacked them" (04/14/2017)   Mitral insufficiency    Moderate mitral regurgitation 10/02/2021   Echocardiogram 6/23: EF 45, global HK, mild ASH, mildly reduced RVSF, normal PASP, RVSP 31, mod LAD, mild RAE, mod MR, trivial AI, AV sclerosis w/o stenosis   Myocardial infarction (HCC)    "I've had some mild one" (04/14/2017)   Presence of permanent cardiac pacemaker    Skin cancer    "nose"   VT (ventricular tachycardia)-nonsustained 05/20/2011    Current Outpatient Medications  Medication Sig Dispense Refill   acetaminophen (TYLENOL) 500 MG tablet Take 1,000 mg by mouth every 6 (six) hours as needed for moderate pain.     Aflibercept (EYLEA) 2 MG/0.05ML SOLN by Intravitreal route.  allopurinol (ZYLOPRIM) 100 MG tablet Take 100 mg by mouth at bedtime.     amLODipine (NORVASC) 5 MG tablet Take 1 tablet (5 mg total) by mouth daily. 90 tablet 2   apixaban (ELIQUIS) 5 MG TABS tablet TAKE 1 TABLET TWICE DAILY 180 tablet 1   azelastine (ASTELIN) 0.1 % nasal spray Place 1 spray into both nostrils 2 (two) times daily. Use in each nostril as directed     clopidogrel (PLAVIX) 75 MG tablet Take 1 tablet (75 mg total) by mouth at bedtime. 90 tablet 3   empagliflozin (JARDIANCE) 10 MG  TABS tablet Take 1 tablet (10 mg total) by mouth daily before breakfast. 90 tablet 3   Evolocumab (REPATHA SURECLICK) 140 MG/ML SOAJ Inject 140 mg into the skin every 14 (fourteen) days. 6 mL 3   furosemide (LASIX) 80 MG tablet Take 1 tablet (80 mg total) by mouth daily. 90 tablet 3   hydrALAZINE (APRESOLINE) 25 MG tablet TAKE 1 TABLET THREE TIMES DAILY 270 tablet 2   isosorbide mononitrate (IMDUR) 120 MG 24 hr tablet TAKE 1 TABLET (120 MG TOTAL) BY MOUTH DAILY. 90 tablet 3   ketoconazole (NIZORAL) 2 % cream Apply 1 application. topically daily as needed (itching toes).     metoprolol succinate (TOPROL-XL) 100 MG 24 hr tablet Take 1 tablet (100 mg total) by mouth daily. Take with or immediately following a meal. 90 tablet 3   nitroGLYCERIN (NITROSTAT) 0.4 MG SL tablet PLACE 1 TABLET UNDER THE TONGUE EVERY 5 MINUTES AS NEEDED FOR CHEST PAIN 75 tablet 2   ranolazine (RANEXA) 500 MG 12 hr tablet Take 1 tablet (500 mg total) by mouth 2 (two) times daily. 180 tablet 0   tamsulosin (FLOMAX) 0.4 MG CAPS Take 0.4 mg by mouth daily.     No current facility-administered medications for this encounter.    Allergies  Allergen Reactions   Amiodarone Other (See Comments)    Lung toxicity       Social History   Socioeconomic History   Marital status: Married    Spouse name: Not on file   Number of children: Not on file   Years of education: Not on file   Highest education level: Not on file  Occupational History   Not on file  Tobacco Use   Smoking status: Never    Passive exposure: Yes   Smokeless tobacco: Never   Tobacco comments:    Never smoke 10/21/21  Vaping Use   Vaping status: Never Used  Substance and Sexual Activity   Alcohol use: Yes    Alcohol/week: 3.0 standard drinks of alcohol    Types: 1 Glasses of wine, 1 Cans of beer, 1 Standard drinks or equivalent per week    Comment: 2-3 drinks per week 10/21/21   Drug use: No   Sexual activity: Not Currently  Other Topics Concern    Not on file  Social History Narrative   Not on file   Social Determinants of Health   Financial Resource Strain: Not on file  Food Insecurity: No Food Insecurity (05/22/2022)   Hunger Vital Sign    Worried About Running Out of Food in the Last Year: Never true    Ran Out of Food in the Last Year: Never true  Transportation Needs: No Transportation Needs (05/22/2022)   PRAPARE - Administrator, Civil Service (Medical): No    Lack of Transportation (Non-Medical): No  Physical Activity: Insufficiently Active (06/25/2017)   Exercise Vital Sign  Days of Exercise per Week: 4 days    Minutes of Exercise per Session: 30 min  Stress: No Stress Concern Present (06/25/2017)   [employer] of Occupational Health - Occupational Stress Questionnaire    Feeling of Stress : Not at all  Social Connections: Not on file  Intimate Partner Violence: Not At Risk (05/22/2022)   Humiliation, Afraid, Rape, and Kick questionnaire    Fear of Current or Ex-Partner: No    Emotionally Abused: No    Physically Abused: No    Sexually Abused: No      Family History  Problem Relation Age of Onset   Heart attack Father        72s   Heart disease Father        Heart Disease before age 43   Cancer Mother     PHYSICAL EXAM: Vitals:   12/26/22 0915  BP: 120/60  Pulse: 62  SpO2: 93%   GENERAL: Well nourished, well developed, and in no apparent distress at rest.  HEENT: Negative for arcus senilis or xanthelasma. There is no scleral icterus.  The mucous membranes are pink and moist.   NECK: Supple, No masses. Normal carotid upstrokes without bruits. No masses or thyromegaly.    CHEST: There are no chest wall deformities. There is no chest wall tenderness. Respirations are unlabored.  Lungs- CTA B/L CARDIAC:  JVP: 7 cm          Normal rate with regular rhythm. No murmurs, rubs or gallops.  Pulses are 2+ and symmetrical in upper and lower extremities. 1+ edema.  ABDOMEN: Soft, non-tender,  non-distended. There are no masses or hepatomegaly. There are normal bowel sounds.  EXTREMITIES: Warm and well perfused with no cyanosis, clubbing.  LYMPHATIC: No axillary or supraclavicular lymphadenopathy.  NEUROLOGIC: Patient is oriented x3 with no focal or lateralizing neurologic deficits.  PSYCH: Patients affect is appropriate, there is no evidence of anxiety or depression.  SKIN: Warm and dry; no lesions or wounds.    DATA REVIEW  GNF:AOZHYQ fibrillation with V paced rhythm  ECHO: 02/26/22: LVEF 45-50% with inferior/inferolateral hypokinesis, normal RV function.  10/01/21: LVEF 45%, RV function mildly reduced 08/19/19: LVEF 45-50%, RVF normal.   CATH: LHC: 08/15/21:  Severe triple vessel CAD s/p CABG with 2/3 patent bypass grafts Chronic occlusion of the ostial LAD. The mid and distal LAD fills from the patent LIMA graft. Stable moderate stenosis at the anastomosis of the LIMA gaft to the LAD Chronic occlusion of the intermediate branch that fills from left to left collaterals supplied by the LIMA to LAD.  Patent proximal and mid Circumflex stents. Known occlusion of SVG to OM (non injected) Chronic occlusion ostial RCA (non injected) Patent SVG to PDA. Severe stenosis in the stented segment in the proximal SVG and severe stenosis in the stented segment in the distal SVG.  Successful PTCA with scoring balloon angioplasty of both stented segments in the SVG to PDA Normal LVEDP   ASSESSMENT & PLAN:  Ischemic cardiomyopathy with mildly reduced LVEF Etiology of MV:HQIONGEX, inferolateral hypokinesis, multiple PCIs and CABG as noted above NYHA class / AHA Stage:III Volume status & Diuretics: euvolemic,lasix 80mg  daily.  Vasodilators:Amlodipine 10mg  daily.  Beta-Blocker:metoprolol succinate 50mg  daily.  BMW:UXLKGMW due to CKD Cardiometabolic:continue Jardiance 10 mg; repeat BMP today.  Devices therapies & Valvulopathies: In late December/early January 2024 Mr. Mattheis had  significant progression of unstable angina.  He had a left heart cath that was stable with severe CAD as noted above.  He was started on Ranexa 500 mg twice daily.  He notes complete resolution of angina since addition of Ranexa.  Has now started walking 1/4 mile daily.  In addition underwent upgrade from dual-chamber pacemaker to biventricular pacemaker due to 100% RV pacing.  He has also completed prednisone for pulmonary fibrosis from amiodarone.  With these changes he has continued to feel improvement in exercise capacity.  Will continue to monitor for the time being. Interval hx: stable functional status today; interested in barostim v CCM. Will obtain repeat TTE today to re-assess LV function.   2. CAD - multiple interventions has noted above. Likely etiology of a significant portion of his dyspnea.  - LHC recently with stable CAD; started on ranexa 500mg  BID two weeks ago with significant improvement in angina.   3. CKD  - sCr stable; repeat labs today.  - jardiance 10mg  daily.  4. Persistent atrial fibrillation - Followed closely by Dr. Graciela Husbands - Bi-V pacemaker  5. Carotid artery disease - s/p CEA  6. Amiodarone lung toxicity  - PFTs w/ moderately reduced DLCO but no other signs of restrictive/obstructive disease.  - Continuing prednisone taper  Maelie Chriswell Advanced Heart Failure Mechanical Circulatory Support

## 2022-12-26 NOTE — Addendum Note (Signed)
Encounter addended by: Levonne Spiller, RN on: 12/26/2022 9:46 AM  Actions taken: Order list changed, Diagnosis association updated, Clinical Note Signed

## 2022-12-26 NOTE — Patient Instructions (Signed)
No change in medications. Echo ordered for you. We will get insurance authorization and call you to schedule when obtained. Labs today - we will call you if abnormal. Return to see Dr. Gasper Lloyd in one year. Please call us at [phone] in July 2024 to schedule this appointment. Please call us at [phone] if any questions or concerns prior to your next visit.

## 2022-12-31 NOTE — Progress Notes (Signed)
Remote pacemaker transmission.   

## 2023-01-05 ENCOUNTER — Other Ambulatory Visit (HOSPITAL_COMMUNITY): Payer: Self-pay | Admitting: Cardiovascular Disease

## 2023-01-06 ENCOUNTER — Ambulatory Visit (HOSPITAL_COMMUNITY)
Admission: RE | Admit: 2023-01-06 | Discharge: 2023-01-06 | Disposition: A | Discharge status: Home / Self Care | Payer: Medicare HMO | Source: Ambulatory Visit | Attending: Internal Medicine | Admitting: Internal Medicine

## 2023-01-06 DIAGNOSIS — I110 Hypertensive heart disease with heart failure: Secondary | ICD-10-CM | POA: Insufficient documentation

## 2023-01-06 DIAGNOSIS — I509 Heart failure, unspecified: Secondary | ICD-10-CM | POA: Insufficient documentation

## 2023-01-06 DIAGNOSIS — E785 Hyperlipidemia, unspecified: Secondary | ICD-10-CM | POA: Diagnosis not present

## 2023-01-06 DIAGNOSIS — Z950 Presence of cardiac pacemaker: Secondary | ICD-10-CM | POA: Diagnosis not present

## 2023-01-06 DIAGNOSIS — I350 Nonrheumatic aortic (valve) stenosis: Secondary | ICD-10-CM | POA: Diagnosis not present

## 2023-01-06 DIAGNOSIS — I2510 Atherosclerotic heart disease of native coronary artery without angina pectoris: Secondary | ICD-10-CM | POA: Insufficient documentation

## 2023-01-06 DIAGNOSIS — I255 Ischemic cardiomyopathy: Secondary | ICD-10-CM | POA: Insufficient documentation

## 2023-01-06 DIAGNOSIS — I5020 Unspecified systolic (congestive) heart failure: Secondary | ICD-10-CM | POA: Diagnosis not present

## 2023-01-06 LAB — ECHOCARDIOGRAM COMPLETE
AR max vel: 1.23 cm2
AV Area VTI: 1.25 cm2
AV Area mean vel: 1.33 cm2
AV Mean grad: 16 mmHg
AV Peak grad: 30.7 mmHg
Ao pk vel: 2.77 m/s
Area-P 1/2: 3.19 cm2
S' Lateral: 4.3 cm

## 2023-01-06 NOTE — Telephone Encounter (Signed)
Prescription refill request for Eliquis received. Indication:afib Last office visit:7/24 Scr:2.72  8/24 Age: 77 Weight:74.2  kg  Prescription refilled

## 2023-01-07 DIAGNOSIS — H353211 Exudative age-related macular degeneration, right eye, with active choroidal neovascularization: Secondary | ICD-10-CM | POA: Diagnosis not present

## 2023-01-08 ENCOUNTER — Telehealth (HOSPITAL_COMMUNITY): Payer: Self-pay | Admitting: Cardiology

## 2023-01-08 NOTE — Telephone Encounter (Signed)
Patient called for echo and lab results

## 2023-01-12 ENCOUNTER — Telehealth (HOSPITAL_COMMUNITY): Payer: Self-pay

## 2023-01-12 ENCOUNTER — Encounter: Payer: Self-pay | Admitting: Cardiovascular Disease

## 2023-01-12 NOTE — Telephone Encounter (Signed)
Patient advised and verbalized understanding. Patient annoyed that he has called in the past and never got a response. I reminded patient of our policy "no news is good news" patient states he does not accept that.

## 2023-01-12 NOTE — Telephone Encounter (Signed)
Patient called about echo and lab results. Please advise.

## 2023-01-23 DIAGNOSIS — R809 Proteinuria, unspecified: Secondary | ICD-10-CM | POA: Diagnosis not present

## 2023-01-23 DIAGNOSIS — I5040 Unspecified combined systolic (congestive) and diastolic (congestive) heart failure: Secondary | ICD-10-CM | POA: Diagnosis not present

## 2023-01-23 DIAGNOSIS — N2581 Secondary hyperparathyroidism of renal origin: Secondary | ICD-10-CM | POA: Diagnosis not present

## 2023-01-23 DIAGNOSIS — N184 Chronic kidney disease, stage 4 (severe): Secondary | ICD-10-CM | POA: Diagnosis not present

## 2023-01-23 DIAGNOSIS — E875 Hyperkalemia: Secondary | ICD-10-CM | POA: Diagnosis not present

## 2023-01-23 DIAGNOSIS — R0902 Hypoxemia: Secondary | ICD-10-CM | POA: Diagnosis not present

## 2023-01-23 DIAGNOSIS — I130 Hypertensive heart and chronic kidney disease with heart failure and stage 1 through stage 4 chronic kidney disease, or unspecified chronic kidney disease: Secondary | ICD-10-CM | POA: Diagnosis not present

## 2023-01-23 DIAGNOSIS — I4811 Longstanding persistent atrial fibrillation: Secondary | ICD-10-CM | POA: Diagnosis not present

## 2023-01-24 LAB — LAB REPORT - SCANNED
Albumin, Urine POC: 4.1
Creatinine, POC: 2.81 mg/dL
EGFR: 22

## 2023-01-26 ENCOUNTER — Ambulatory Visit: Payer: Medicare HMO | Attending: Cardiovascular Disease | Admitting: Cardiovascular Disease

## 2023-01-26 ENCOUNTER — Encounter: Payer: Self-pay | Admitting: Cardiovascular Disease

## 2023-01-26 VITALS — BP 134/70 | HR 64 | Ht 69.0 in | Wt 165.6 lb

## 2023-01-26 DIAGNOSIS — I4819 Other persistent atrial fibrillation: Secondary | ICD-10-CM

## 2023-01-26 DIAGNOSIS — E782 Mixed hyperlipidemia: Secondary | ICD-10-CM

## 2023-01-26 DIAGNOSIS — I340 Nonrheumatic mitral (valve) insufficiency: Secondary | ICD-10-CM | POA: Diagnosis not present

## 2023-01-26 DIAGNOSIS — I5020 Unspecified systolic (congestive) heart failure: Secondary | ICD-10-CM | POA: Diagnosis not present

## 2023-01-26 DIAGNOSIS — I25119 Atherosclerotic heart disease of native coronary artery with unspecified angina pectoris: Secondary | ICD-10-CM | POA: Diagnosis not present

## 2023-01-26 DIAGNOSIS — N184 Chronic kidney disease, stage 4 (severe): Secondary | ICD-10-CM | POA: Diagnosis not present

## 2023-01-26 DIAGNOSIS — I442 Atrioventricular block, complete: Secondary | ICD-10-CM

## 2023-01-26 NOTE — Patient Instructions (Signed)
Medication Instructions:  Your physician recommends that you continue on your current medications as directed. Please refer to the Current Medication list given to you today.  *If you need a refill on your cardiac medications before your next appointment, please call your pharmacy*   Lab Work: NONE If you have labs (blood work) drawn today and your tests are completely normal, you will receive your results only by: MyChart Message (if you have MyChart) OR A paper copy in the mail If you have any lab test that is abnormal or we need to change your treatment, we will call you to review the results.   Testing/Procedures: NONE   Follow-Up: At [facility], you and your health needs are our priority.  As part of our continuing mission to provide you with exceptional heart care, we have created designated Provider Care Teams.  These Care Teams include your primary Cardiologist (physician) and Advanced Practice Providers (APPs -  Physician Assistants and Nurse Practitioners) who all work together to provide you with the care you need, when you need it.  We recommend signing up for the patient portal called "MyChart".  Sign up information is provided on this After Visit Summary.  MyChart is used to connect with patients for Virtual Visits (Telemedicine).  Patients are able to view lab/test results, encounter notes, upcoming appointments, etc.  Non-urgent messages can be sent to your provider as well.   To learn more about what you can do with MyChart, go to [email or web address].    Your next appointment:   6 month(s)  Provider:   Michael Cooper, MD   

## 2023-01-26 NOTE — Progress Notes (Signed)
Cardiology Office Note:    Date:  01/26/2023   ID:  Kyle Avila, DOB 13-May-1945, MRN [id number]  PCP:  Chilton Greathouse, MD   [facility] Providers Cardiologist:  Tonny Bollman, MD Cardiology APP:  Beatrice Lecher, PA-C  Electrophysiologist:  Sherryl Manges, MD     Referring MD: Chilton Greathouse, MD   Chief Complaint  Patient presents with   Coronary Artery Disease    History of Present Illness:    Kyle Avila is a 77 y.o. male with a hx of:  Coronary artery disease S/p CABG in 1988; redo in 1996 S/p multiple PCI procedures (DES x 2 to LCx in 2009, DES to S-PDA in 2016, DES x 2 to S-PDA in 2018, DES to RPDA and cutting balloon POBA to S-RPDA (ISR) 6/21; POBA to both stented segments of S-PDA in 07/2021) LHC 05/21/2022: LM 100, LAD ostial 100, mid 65 (at anastomosis), distal 40 (after anastomosis); RI ostial 100; LM to LCx stent patent, OM1 100; RCA ostial 100 CTO, RPDA stent 95 ISR; SVG-RPDA stent patent with 30 ISR, 40 ISR; SVG-OM1 100 CTO; LIMA-LAD patent>> med Rx (+ Ranexa) HFmrEF (heart failure with mildly reduced ejection fraction)  Ischemic CM TTE 02/26/2022: EF 45-50, mildly reduced RVSF, RVSP 33.9, mild to moderate MR, AV sclerosis, RAP 3   Persistent atrial fibrillation  S/p DCCV 06/2021 >> ERAF Amiodarone DC'd (Lung toxicity; failed DCCV) Completed course of Prednisone  Dr. Judeth Horn ([place]) Mild to mod mitral regurgitation  Carotid artery disease S/p R CEA Carotid US 04/18/2022: Patent R CEA; L ICA 1-39; R VA stenosis CHB, s/p pacemaker S/p Upgrade to CRT 03/2022 Chronic kidney disease Hypertension Hyperlipidemia     The patient is here alone today.  He is doing fairly well.  He complains of generalized fatigue, but he has not been limited by shortness of breath and he has not had any recent angina at all.  No orthopnea or PND.  No leg swelling.  He brings in daily weights that are stable.  States that he has not been restricting his fluid intake.   He has recently seen Dr. Arrie Aran who has began discussing dialysis with him.  His creatinine recently has been in the 2.7-2.8 range.  If he requires dialysis, he is hoping that he can do peritoneal dialysis.  Past Medical History:  Diagnosis Date   Arthritis    "some in my back" (04/14/2017)   CAD (coronary artery disease)    a.  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HCC)    s/p PTVDP   Gout    HTN (hypertension)    Hyperlipidemia    Lupus (HCC)    "that's why my kidneys are gone; lupus attacked them" (04/14/2017)   Mitral insufficiency    Moderate mitral regurgitation 10/02/2021   Echocardiogram 6/23: EF 45, global HK, mild ASH, mildly reduced RVSF, normal PASP, RVSP 31, mod LAD, mild RAE, mod MR, trivial AI, AV sclerosis w/o stenosis   Myocardial infarction (HCC)    "I've had some mild one" (04/14/2017)   Presence of permanent cardiac pacemaker    Skin cancer    "nose"   VT (ventricular tachycardia)-nonsustained 05/20/2011    Past Surgical History:  Procedure Laterality Date   ACHILLES TENDON REPAIR Right    BIV UPGRADE N/A 04/23/2022  Procedure: BIV PPM UPGRADE;  Surgeon: Duke Salvia, MD;  Location: [facility] INVASIVE CV LAB;  Service: Cardiovascular;  Laterality: N/A;   CARDIAC CATHETERIZATION     CARDIAC CATHETERIZATION N/A 11/03/2014   Procedure: Left Heart Cath and Cors/Grafts Angiography;  Surgeon: Tonny Bollman, MD;  Location: [facility] INVASIVE CV LAB;  Service: Cardiovascular;  Laterality: N/A;   CARDIAC CATHETERIZATION N/A 11/03/2014   Procedure: Coronary Stent Intervention;  Surgeon: Tonny Bollman, MD;  Location: [facility] INVASIVE CV LAB;  Service: Cardiovascular;  Laterality: N/A;   CARDIOVERSION N/A 07/17/2021   Procedure: CARDIOVERSION;  Surgeon: Maisie Fus, MD;  Location: [facility] ENDOSCOPY;  Service: Cardiovascular;  Laterality: N/A;    CARDIOVERSION N/A 10/08/2021   Procedure: CARDIOVERSION;  Surgeon: Meriam Sprague, MD;  Location: [facility] ENDOSCOPY;  Service: Cardiovascular;  Laterality: N/A;   CAROTID ENDARTERECTOMY  2002   "? side"   CATARACT EXTRACTION W/ INTRAOCULAR LENS IMPLANT     "? side"   CORONARY ANGIOPLASTY WITH STENT PLACEMENT  04/14/2017   CORONARY ANGIOPLASTY WITH STENT PLACEMENT  11/03/2014   SVG PVA   CORONARY ARTERY BYPASS GRAFT     with redo cabg   CORONARY BALLOON ANGIOPLASTY N/A 08/15/2021   Procedure: CORONARY BALLOON ANGIOPLASTY;  Surgeon: Kathleene Hazel, MD;  Location: [facility] INVASIVE CV LAB;  Service: Cardiovascular;  Laterality: N/A;   CORONARY STENT INTERVENTION N/A 10/18/2019   Procedure: CORONARY STENT INTERVENTION;  Surgeon: Tonny Bollman, MD;  Location: [facility] INVASIVE CV LAB;  Service: Cardiovascular;  Laterality: N/A;   CORONARY/GRAFT ANGIOGRAPHY N/A 10/18/2019   Procedure: CORONARY/GRAFT ANGIOGRAPHY;  Surgeon: Tonny Bollman, MD;  Location: [facility] INVASIVE CV LAB;  Service: Cardiovascular;  Laterality: N/A;   EP IMPLANTABLE DEVICE N/A 09/13/2014   Procedure: PPM Generator Changeout;  Surgeon: Duke Salvia, MD;  Location: [facility] INVASIVE CV LAB;  Service: Cardiovascular;  Laterality: N/A;   LEFT HEART CATH AND CORS/GRAFTS ANGIOGRAPHY N/A 04/14/2017   Procedure: LEFT HEART CATH AND CORS/GRAFTS ANGIOGRAPHY;  Surgeon: Tonny Bollman, MD;  Location: [facility] INVASIVE CV LAB;  Service: Cardiovascular;  Laterality: N/A;   LEFT HEART CATH AND CORS/GRAFTS ANGIOGRAPHY N/A 08/15/2021   Procedure: LEFT HEART CATH AND CORS/GRAFTS ANGIOGRAPHY;  Surgeon: Kathleene Hazel, MD;  Location: [facility] INVASIVE CV LAB;  Service: Cardiovascular;  Laterality: N/A;   LEFT HEART CATH AND CORS/GRAFTS ANGIOGRAPHY N/A 05/21/2022   Procedure: LEFT HEART CATH AND CORS/GRAFTS ANGIOGRAPHY;  Surgeon: Tonny Bollman, MD;  Location: [facility] INVASIVE CV LAB;  Service: Cardiovascular;  Laterality: N/A;   MOHS SURGERY     "just outside my  nose"   PACEMAKER INSERTION  2007   TONSILLECTOMY  1958    Current Medications: Current Meds  Medication Sig   acetaminophen (TYLENOL) 500 MG tablet Take 1,000 mg by mouth every 6 (six) hours as needed for moderate pain.   Aflibercept (EYLEA) 2 MG/0.05ML SOLN by Intravitreal route.   allopurinol (ZYLOPRIM) 100 MG tablet Take 100 mg by mouth at bedtime.   amLODipine (NORVASC) 5 MG tablet Take 1 tablet (5 mg total) by mouth daily.   azelastine (ASTELIN) 0.1 % nasal spray Place 1 spray into both nostrils 2 (two) times daily. Use in each nostril as directed   clopidogrel (PLAVIX) 75 MG tablet Take 1 tablet (75 mg total) by mouth at bedtime.   ELIQUIS 5 MG TABS tablet TAKE 1 TABLET TWICE DAILY   empagliflozin (JARDIANCE) 10 MG TABS tablet Take 1 tablet (10 mg total) by mouth daily before breakfast.   Evolocumab (REPATHA SURECLICK)  140 MG/ML SOAJ Inject 140 mg into the skin every 14 (fourteen) days.   furosemide (LASIX) 80 MG tablet Take 1 tablet (80 mg total) by mouth daily.   hydrALAZINE (APRESOLINE) 25 MG tablet TAKE 1 TABLET THREE TIMES DAILY   isosorbide mononitrate (IMDUR) 120 MG 24 hr tablet TAKE 1 TABLET (120 MG TOTAL) BY MOUTH DAILY.   ketoconazole (NIZORAL) 2 % cream Apply 1 application. topically daily as needed (itching toes).   metoprolol succinate (TOPROL-XL) 100 MG 24 hr tablet Take 1 tablet (100 mg total) by mouth daily. Take with or immediately following a meal.   nitroGLYCERIN (NITROSTAT) 0.4 MG SL tablet PLACE 1 TABLET UNDER THE TONGUE EVERY 5 MINUTES AS NEEDED FOR CHEST PAIN   ranolazine (RANEXA) 500 MG 12 hr tablet Take 1 tablet (500 mg total) by mouth 2 (two) times daily.   tamsulosin (FLOMAX) 0.4 MG CAPS Take 0.4 mg by mouth daily.     Allergies:   Amiodarone   Social History   Socioeconomic History   Marital status: Married    Spouse name: Not on file   Number of children: Not on file   Years of education: Not on file   Highest education level: Not on file   Occupational History   Not on file  Tobacco Use   Smoking status: Never    Passive exposure: Yes   Smokeless tobacco: Never   Tobacco comments:    Never smoke 10/21/21  Vaping Use   Vaping status: Never Used  Substance and Sexual Activity   Alcohol use: Yes    Alcohol/week: 3.0 standard drinks of alcohol    Types: 1 Glasses of wine, 1 Cans of beer, 1 Standard drinks or equivalent per week    Comment: 2-3 drinks per week 10/21/21   Drug use: No   Sexual activity: Not Currently  Other Topics Concern   Not on file  Social History Narrative   Not on file   Social Determinants of Health   Financial Resource Strain: Not on file  Food Insecurity: No Food Insecurity (05/22/2022)   Hunger Vital Sign    Worried About Running Out of Food in the Last Year: Never true    Ran Out of Food in the Last Year: Never true  Transportation Needs: No Transportation Needs (05/22/2022)   PRAPARE - Administrator, Civil Service (Medical): No    Lack of Transportation (Non-Medical): No  Physical Activity: Insufficiently Active (06/25/2017)   Exercise Vital Sign    Days of Exercise per Week: 4 days    Minutes of Exercise per Session: 30 min  Stress: No Stress Concern Present (06/25/2017)   [employer] of Occupational Health - Occupational Stress Questionnaire    Feeling of Stress : Not at all  Social Connections: Not on file     Family History: [name] family history includes Cancer in his mother; Heart attack in his father; Heart disease in his father.  ROS:   Please see the history of present illness.    All other systems reviewed and are negative.  EKGs/Labs/Other Studies Reviewed:    The following studies were reviewed today: Echo 01/06/2023: 1. Left ventricular ejection fraction, by estimation, is 45 to 50%. The  left ventricle has mildly decreased function. The left ventricle  demonstrates regional wall motion abnormalities (see scoring  diagram/findings for  description). There is mild  asymmetric left ventricular hypertrophy. Left ventricular diastolic  parameters are indeterminate. There is moderate hypokinesis of the left  ventricular, basal-mid inferolateral wall. There is mild hypokinesis of  the left ventricular, basal anteroseptal  wall, inferoseptal wall, anterior wall and inferior wall.   2. Right ventricular systolic function is normal. The right ventricular  size is normal. There is normal pulmonary artery systolic pressure.   3. Left atrial size was mildly dilated.   4. The mitral valve is normal in structure. Mild mitral valve  regurgitation. No evidence of mitral stenosis.   5. The aortic valve is calcified. There is moderate calcification of the  aortic valve. There is moderate thickening of the aortic valve. Aortic  valve regurgitation is not visualized. Mild aortic valve stenosis.   6. The inferior vena cava is dilated in size with >50% respiratory  variability, suggesting right atrial pressure of 8 mmHg.   Comparison(s): No significant change from prior study.   Conclusion(s)/Recommendation(s): Strain shows "cherry on top" pattern,  cannot exclude amyloid as etiology. Consider PYP scan if clinically  appropriate.      Recent Labs: 04/07/2022: ALT 47; TSH 4.800 05/16/2022: Hemoglobin 14.7; Platelets 190 12/26/2022: B Natriuretic Peptide 407.0; BUN 44; Creatinine, Ser 2.72; Potassium 4.3; Sodium 139  Recent Lipid Panel    Component Value Date/Time   CHOL 96 (L) 04/07/2022 0915   TRIG 145 04/07/2022 0915   HDL 57 04/07/2022 0915   CHOLHDL 1.7 04/07/2022 0915   CHOLHDL 2.4 10/19/2019 0306   VLDL 55 (H) 10/19/2019 0306   LDLCALC 15 04/07/2022 0915   LDLDIRECT 47 10/28/2019 0820     Risk Assessment/Calculations:    CHA2DS2-VASc Score = 5   This indicates a 7.2% annual risk of stroke. [name] score is based upon: CHF History: 1 HTN History: 1 Diabetes History: 0 Stroke History: 0 Vascular Disease History:  1 Age Score: 2 Gender Score: 0               Physical Exam:    VS:  BP 134/70   Pulse 64   Ht 5\' 9"  (1.753 m)   Wt 165 lb 9.6 oz (75.1 kg)   SpO2 97%   BMI 24.45 kg/m     Wt Readings from Last 3 Encounters:  01/26/23 165 lb 9.6 oz (75.1 kg)  12/26/22 163 lb 9.6 oz (74.2 kg)  11/04/22 165 lb 6.4 oz (75 kg)     GEN:  Well nourished, well developed in no acute distress HEENT: Normal NECK: No JVD; No carotid bruits LYMPHATICS: No lymphadenopathy CARDIAC: RRR, 2/6 systolic murmur at the RUSB RESPIRATORY:  Clear to auscultation without rales, wheezing or rhonchi  ABDOMEN: Soft, non-tender, non-distended MUSCULOSKELETAL:  No edema; No deformity  SKIN: Warm and dry, stasis dermatitis noted NEUROLOGIC:  Alert and oriented x 3 PSYCHIATRIC:  Normal affect   ASSESSMENT:    1. HFrEF (heart failure with reduced ejection fraction) (HCC)   2. Coronary artery disease involving native coronary artery of native heart with angina pectoris (HCC)   3. Complete heart block s/p PPM   4. Persistent atrial fibrillation (HCC)   5. Moderate mitral regurgitation   6. Mixed hyperlipidemia   7. CKD (chronic kidney disease) stage 4, GFR 15-29 ml/min (HCC)    PLAN:    In order of problems listed above:  Overall stable on a regimen of furosemide, Jardiance, metoprolol succinate.  Advanced kidney disease will not allow for ARB/ACE/ARNI/spironolactone. Doing well on current regimen with amlodipine, isosorbide, metoprolol succinate, and ranolazine.  Continues on antiplatelet therapy with clopidogrel. Stable, followed by Dr. Graciela Husbands Anticoagulated with apixaban, no  bleeding problems Clinically stable Treated with Repatha.  Cholesterol 96, LDL 15. Reviewed his labs which appear to be stable over the past year.  Followed closely by nephrology.           Medication Adjustments/Labs and Tests Ordered: Current medicines are reviewed at length with the patient today.  Concerns regarding medicines  are outlined above.  No orders of the defined types were placed in this encounter.  No orders of the defined types were placed in this encounter.   Patient Instructions  Medication Instructions:  Your physician recommends that you continue on your current medications as directed. Please refer to the Current Medication list given to you today.  *If you need a refill on your cardiac medications before your next appointment, please call your pharmacy*   Lab Work: NONE If you have labs (blood work) drawn today and your tests are completely normal, you will receive your results only by: MyChart Message (if you have MyChart) OR A paper copy in the mail If you have any lab test that is abnormal or we need to change your treatment, we will call you to review the results.   Testing/Procedures: NONE   Follow-Up: At [facility], you and your health needs are our priority.  As part of our continuing mission to provide you with exceptional heart care, we have created designated Provider Care Teams.  These Care Teams include your primary Cardiologist (physician) and Advanced Practice Providers (APPs -  Physician Assistants and Nurse Practitioners) who all work together to provide you with the care you need, when you need it.  We recommend signing up for the patient portal called "MyChart".  Sign up information is provided on this After Visit Summary.  MyChart is used to connect with patients for Virtual Visits (Telemedicine).  Patients are able to view lab/test results, encounter notes, upcoming appointments, etc.  Non-urgent messages can be sent to your provider as well.   To learn more about what you can do with MyChart, go to [email or web address].    Your next appointment:   6 month(s)  Provider:   Tonny Bollman, MD        Signed, Tonny Bollman, MD  01/26/2023 5:23 PM    [facility]

## 2023-01-28 DIAGNOSIS — E785 Hyperlipidemia, unspecified: Secondary | ICD-10-CM | POA: Diagnosis not present

## 2023-01-28 DIAGNOSIS — Z0189 Encounter for other specified special examinations: Secondary | ICD-10-CM | POA: Diagnosis not present

## 2023-01-28 DIAGNOSIS — N184 Chronic kidney disease, stage 4 (severe): Secondary | ICD-10-CM | POA: Diagnosis not present

## 2023-01-28 DIAGNOSIS — E039 Hypothyroidism, unspecified: Secondary | ICD-10-CM | POA: Diagnosis not present

## 2023-01-28 DIAGNOSIS — M109 Gout, unspecified: Secondary | ICD-10-CM | POA: Diagnosis not present

## 2023-01-28 DIAGNOSIS — Z1212 Encounter for screening for malignant neoplasm of rectum: Secondary | ICD-10-CM | POA: Diagnosis not present

## 2023-01-28 DIAGNOSIS — Z0000 Encounter for general adult medical examination without abnormal findings: Secondary | ICD-10-CM | POA: Diagnosis not present

## 2023-01-28 DIAGNOSIS — R739 Hyperglycemia, unspecified: Secondary | ICD-10-CM | POA: Diagnosis not present

## 2023-02-03 DIAGNOSIS — R82998 Other abnormal findings in urine: Secondary | ICD-10-CM | POA: Diagnosis not present

## 2023-02-04 DIAGNOSIS — Z1339 Encounter for screening examination for other mental health and behavioral disorders: Secondary | ICD-10-CM | POA: Diagnosis not present

## 2023-02-04 DIAGNOSIS — N184 Chronic kidney disease, stage 4 (severe): Secondary | ICD-10-CM | POA: Diagnosis not present

## 2023-02-04 DIAGNOSIS — D692 Other nonthrombocytopenic purpura: Secondary | ICD-10-CM | POA: Diagnosis not present

## 2023-02-04 DIAGNOSIS — I25118 Atherosclerotic heart disease of native coronary artery with other forms of angina pectoris: Secondary | ICD-10-CM | POA: Diagnosis not present

## 2023-02-04 DIAGNOSIS — M329 Systemic lupus erythematosus, unspecified: Secondary | ICD-10-CM | POA: Diagnosis not present

## 2023-02-04 DIAGNOSIS — F0150 Vascular dementia without behavioral disturbance: Secondary | ICD-10-CM | POA: Diagnosis not present

## 2023-02-04 DIAGNOSIS — N62 Hypertrophy of breast: Secondary | ICD-10-CM | POA: Diagnosis not present

## 2023-02-04 DIAGNOSIS — Z23 Encounter for immunization: Secondary | ICD-10-CM | POA: Diagnosis not present

## 2023-02-04 DIAGNOSIS — I5022 Chronic systolic (congestive) heart failure: Secondary | ICD-10-CM | POA: Diagnosis not present

## 2023-02-04 DIAGNOSIS — I480 Paroxysmal atrial fibrillation: Secondary | ICD-10-CM | POA: Diagnosis not present

## 2023-02-04 DIAGNOSIS — Z0000 Encounter for general adult medical examination without abnormal findings: Secondary | ICD-10-CM | POA: Diagnosis not present

## 2023-02-04 DIAGNOSIS — I130 Hypertensive heart and chronic kidney disease with heart failure and stage 1 through stage 4 chronic kidney disease, or unspecified chronic kidney disease: Secondary | ICD-10-CM | POA: Diagnosis not present

## 2023-02-04 DIAGNOSIS — Z1331 Encounter for screening for depression: Secondary | ICD-10-CM | POA: Diagnosis not present

## 2023-02-04 DIAGNOSIS — Z950 Presence of cardiac pacemaker: Secondary | ICD-10-CM | POA: Diagnosis not present

## 2023-02-06 ENCOUNTER — Encounter: Payer: Self-pay | Admitting: Cardiovascular Disease

## 2023-02-06 ENCOUNTER — Encounter: Payer: Self-pay | Admitting: Internal Medicine

## 2023-02-06 NOTE — Telephone Encounter (Signed)
Remote transmission received. Presenting rhythm appears AS/BiV paced with PAC's. 1 AF event logged 02/04/23 with duration 18 hours 5 minutes, controlled ventricular rates.   Advised patient via mychart considering symptoms of chest pain he needs to go to the ER for evaluation. Requested patient message [place] back for f/u.

## 2023-02-18 ENCOUNTER — Encounter: Payer: Self-pay | Admitting: Cardiovascular Disease

## 2023-03-02 ENCOUNTER — Other Ambulatory Visit: Payer: Self-pay | Admitting: Cardiovascular Disease

## 2023-03-04 ENCOUNTER — Encounter: Payer: Self-pay | Admitting: Cardiovascular Disease

## 2023-03-04 DIAGNOSIS — D2262 Melanocytic nevi of left upper limb, including shoulder: Secondary | ICD-10-CM | POA: Diagnosis not present

## 2023-03-04 DIAGNOSIS — D692 Other nonthrombocytopenic purpura: Secondary | ICD-10-CM | POA: Diagnosis not present

## 2023-03-04 DIAGNOSIS — L570 Actinic keratosis: Secondary | ICD-10-CM | POA: Diagnosis not present

## 2023-03-04 DIAGNOSIS — L814 Other melanin hyperpigmentation: Secondary | ICD-10-CM | POA: Diagnosis not present

## 2023-03-04 DIAGNOSIS — Z85828 Personal history of other malignant neoplasm of skin: Secondary | ICD-10-CM | POA: Diagnosis not present

## 2023-03-04 DIAGNOSIS — L821 Other seborrheic keratosis: Secondary | ICD-10-CM | POA: Diagnosis not present

## 2023-03-04 DIAGNOSIS — H353211 Exudative age-related macular degeneration, right eye, with active choroidal neovascularization: Secondary | ICD-10-CM | POA: Diagnosis not present

## 2023-03-04 DIAGNOSIS — C44319 Basal cell carcinoma of skin of other parts of face: Secondary | ICD-10-CM | POA: Diagnosis not present

## 2023-03-06 ENCOUNTER — Ambulatory Visit: Payer: Medicare HMO | Admitting: Cardiovascular Disease

## 2023-03-19 ENCOUNTER — Telehealth: Payer: Self-pay | Admitting: Cardiovascular Disease

## 2023-03-19 NOTE — Telephone Encounter (Signed)
Patient wants a call back to clarify why he will need a VAS US CAROTID test.

## 2023-03-19 NOTE — Telephone Encounter (Signed)
Unable to LM.Marland Kitchen pt does not have voicemail set up.

## 2023-03-23 ENCOUNTER — Ambulatory Visit (INDEPENDENT_AMBULATORY_CARE_PROVIDER_SITE_OTHER): Payer: Medicare HMO

## 2023-03-23 DIAGNOSIS — I442 Atrioventricular block, complete: Secondary | ICD-10-CM | POA: Diagnosis not present

## 2023-03-23 NOTE — Telephone Encounter (Signed)
Kyle Bollman, MD 04/21/2022  5:55 PM EST     Good study result. Continue current Rx and follow-up one year as recommended    Returned call to patient and informed him of above recommendation after his last carotid US d/t his hx of carotid enterectomy, and he states he will not do this test unless "someone looks me right in the eye and places the stethoscope to my neck and explains why I need that. That carotid was fixed 20-30 years ago." I explained that patient doesn't have to do the test and he quickly informed me that he knew that and was trying to be polite. Asked that he speak with Excell Seltzer about this at his next appt and scheduled him for March since he was due and no appt was currently scheduled.

## 2023-03-25 LAB — CUP PACEART REMOTE DEVICE CHECK
Battery Remaining Longevity: 99 mo
Battery Voltage: 3.02 V
Brady Statistic AP VP Percent: 87.58 %
Brady Statistic AP VS Percent: 0.07 %
Brady Statistic AS VP Percent: 10.92 %
Brady Statistic AS VS Percent: 1.43 %
Brady Statistic RA Percent Paced: 88.37 %
Brady Statistic RV Percent Paced: 98.5 %
Date Time Interrogation Session: 20241126135910
Implantable Lead Connection Status: 753985
Implantable Lead Connection Status: 753985
Implantable Lead Connection Status: 753985
Implantable Lead Implant Date: 20070831
Implantable Lead Implant Date: 20070831
Implantable Lead Implant Date: 20231227
Implantable Lead Location: 753858
Implantable Lead Location: 753859
Implantable Lead Location: 753860
Implantable Lead Model: 3830
Implantable Lead Model: 4469
Implantable Lead Model: 4470
Implantable Lead Serial Number: 485949
Implantable Lead Serial Number: 553665
Implantable Pulse Generator Implant Date: 20231227
Lead Channel Impedance Value: 247 Ohm
Lead Channel Impedance Value: 285 Ohm
Lead Channel Impedance Value: 304 Ohm
Lead Channel Impedance Value: 304 Ohm
Lead Channel Impedance Value: 342 Ohm
Lead Channel Impedance Value: 342 Ohm
Lead Channel Impedance Value: 380 Ohm
Lead Channel Impedance Value: 456 Ohm
Lead Channel Impedance Value: 494 Ohm
Lead Channel Pacing Threshold Amplitude: 0.75 V
Lead Channel Pacing Threshold Amplitude: 0.75 V
Lead Channel Pacing Threshold Amplitude: 1.375 V
Lead Channel Pacing Threshold Pulse Width: 0.4 ms
Lead Channel Pacing Threshold Pulse Width: 0.4 ms
Lead Channel Pacing Threshold Pulse Width: 0.4 ms
Lead Channel Sensing Intrinsic Amplitude: 1.25 mV
Lead Channel Sensing Intrinsic Amplitude: 1.25 mV
Lead Channel Sensing Intrinsic Amplitude: 27.125 mV
Lead Channel Sensing Intrinsic Amplitude: 7.25 mV
Lead Channel Setting Pacing Amplitude: 1.25 V
Lead Channel Setting Pacing Amplitude: 1.5 V
Lead Channel Setting Pacing Amplitude: 2.75 V
Lead Channel Setting Pacing Pulse Width: 0.4 ms
Lead Channel Setting Pacing Pulse Width: 0.4 ms
Lead Channel Setting Sensing Sensitivity: 2.8 mV
Zone Setting Status: 755011
Zone Setting Status: 755011

## 2023-03-25 NOTE — Telephone Encounter (Signed)
Addressed in separate encounter.

## 2023-03-27 ENCOUNTER — Encounter: Payer: Self-pay | Admitting: Cardiovascular Disease

## 2023-04-01 ENCOUNTER — Other Ambulatory Visit: Payer: Self-pay | Admitting: Cardiovascular Disease

## 2023-04-07 ENCOUNTER — Encounter: Payer: Self-pay | Admitting: Cardiovascular Disease

## 2023-04-15 DIAGNOSIS — C44319 Basal cell carcinoma of skin of other parts of face: Secondary | ICD-10-CM | POA: Diagnosis not present

## 2023-04-15 DIAGNOSIS — Z85828 Personal history of other malignant neoplasm of skin: Secondary | ICD-10-CM | POA: Diagnosis not present

## 2023-04-20 NOTE — Progress Notes (Signed)
Remote pacemaker transmission.   

## 2023-04-20 NOTE — Addendum Note (Signed)
Addended by: Geralyn Flash D on: 04/20/2023 01:07 PM   Modules accepted: Orders

## 2023-04-24 ENCOUNTER — Other Ambulatory Visit: Payer: Self-pay | Admitting: Physician Assistant

## 2023-04-24 ENCOUNTER — Other Ambulatory Visit: Payer: Self-pay | Admitting: Cardiovascular Disease

## 2023-04-27 DIAGNOSIS — N184 Chronic kidney disease, stage 4 (severe): Secondary | ICD-10-CM | POA: Diagnosis not present

## 2023-05-05 DIAGNOSIS — N184 Chronic kidney disease, stage 4 (severe): Secondary | ICD-10-CM | POA: Diagnosis not present

## 2023-05-05 DIAGNOSIS — E875 Hyperkalemia: Secondary | ICD-10-CM | POA: Diagnosis not present

## 2023-05-05 DIAGNOSIS — N2581 Secondary hyperparathyroidism of renal origin: Secondary | ICD-10-CM | POA: Diagnosis not present

## 2023-05-05 DIAGNOSIS — I130 Hypertensive heart and chronic kidney disease with heart failure and stage 1 through stage 4 chronic kidney disease, or unspecified chronic kidney disease: Secondary | ICD-10-CM | POA: Diagnosis not present

## 2023-05-05 DIAGNOSIS — I4811 Longstanding persistent atrial fibrillation: Secondary | ICD-10-CM | POA: Diagnosis not present

## 2023-05-05 DIAGNOSIS — R809 Proteinuria, unspecified: Secondary | ICD-10-CM | POA: Diagnosis not present

## 2023-05-05 DIAGNOSIS — I5040 Unspecified combined systolic (congestive) and diastolic (congestive) heart failure: Secondary | ICD-10-CM | POA: Diagnosis not present

## 2023-05-05 DIAGNOSIS — I10 Essential (primary) hypertension: Secondary | ICD-10-CM | POA: Diagnosis not present

## 2023-05-05 DIAGNOSIS — R0902 Hypoxemia: Secondary | ICD-10-CM | POA: Diagnosis not present

## 2023-05-06 DIAGNOSIS — H353211 Exudative age-related macular degeneration, right eye, with active choroidal neovascularization: Secondary | ICD-10-CM | POA: Diagnosis not present

## 2023-05-08 DIAGNOSIS — H524 Presbyopia: Secondary | ICD-10-CM | POA: Diagnosis not present

## 2023-05-08 DIAGNOSIS — H0100B Unspecified blepharitis left eye, upper and lower eyelids: Secondary | ICD-10-CM | POA: Diagnosis not present

## 2023-05-08 DIAGNOSIS — H0100A Unspecified blepharitis right eye, upper and lower eyelids: Secondary | ICD-10-CM | POA: Diagnosis not present

## 2023-05-08 DIAGNOSIS — H52203 Unspecified astigmatism, bilateral: Secondary | ICD-10-CM | POA: Diagnosis not present

## 2023-05-13 DIAGNOSIS — H353121 Nonexudative age-related macular degeneration, left eye, early dry stage: Secondary | ICD-10-CM | POA: Diagnosis not present

## 2023-05-13 DIAGNOSIS — H353211 Exudative age-related macular degeneration, right eye, with active choroidal neovascularization: Secondary | ICD-10-CM | POA: Diagnosis not present

## 2023-05-13 DIAGNOSIS — H35363 Drusen (degenerative) of macula, bilateral: Secondary | ICD-10-CM | POA: Diagnosis not present

## 2023-05-19 ENCOUNTER — Encounter: Payer: Self-pay | Admitting: Cardiovascular Disease

## 2023-05-21 ENCOUNTER — Encounter: Payer: Self-pay | Admitting: Cardiovascular Disease

## 2023-05-26 ENCOUNTER — Encounter: Payer: Self-pay | Admitting: Cardiovascular Disease

## 2023-05-26 ENCOUNTER — Ambulatory Visit: Payer: Medicare HMO | Attending: Cardiovascular Disease | Admitting: Cardiovascular Disease

## 2023-05-26 VITALS — BP 142/82 | HR 62 | Ht 69.0 in | Wt 170.2 lb

## 2023-05-26 DIAGNOSIS — N184 Chronic kidney disease, stage 4 (severe): Secondary | ICD-10-CM | POA: Diagnosis not present

## 2023-05-26 DIAGNOSIS — I4819 Other persistent atrial fibrillation: Secondary | ICD-10-CM | POA: Diagnosis not present

## 2023-05-26 DIAGNOSIS — E782 Mixed hyperlipidemia: Secondary | ICD-10-CM

## 2023-05-26 DIAGNOSIS — I25119 Atherosclerotic heart disease of native coronary artery with unspecified angina pectoris: Secondary | ICD-10-CM | POA: Diagnosis not present

## 2023-05-26 MED ORDER — HYDRALAZINE HCL 25 MG PO TABS: 25.0000 mg | ORAL_TABLET | Freq: Three times a day (TID) | ORAL | 3 refills | Status: DC

## 2023-05-26 MED ORDER — AMLODIPINE BESYLATE 5 MG PO TABS: 5.0000 mg | ORAL_TABLET | Freq: Every day | ORAL | 3 refills | Status: DC

## 2023-05-26 MED ORDER — NITROGLYCERIN 0.4 MG SL SUBL: 0.4000 mg | SUBLINGUAL_TABLET | SUBLINGUAL | 3 refills | Status: DC | PRN

## 2023-05-26 NOTE — Assessment & Plan Note (Signed)
Anticoagulated with apixaban.  Patient with permanent pacemaker in place.  No issues with elevated heart rates.  He is pacemaker dependent.

## 2023-05-26 NOTE — Patient Instructions (Signed)
Follow-Up: At [facility], you and your health needs are our priority.  As part of our continuing mission to provide you with exceptional heart care, we have created designated Provider Care Teams.  These Care Teams include your primary Cardiologist (physician) and Advanced Practice Providers (APPs -  Physician Assistants and Nurse Practitioners) who all work together to provide you with the care you need, when you need it.  We recommend signing up for the patient portal called "MyChart".  Sign up information is provided on this After Visit Summary.  MyChart is used to connect with patients for Virtual Visits (Telemedicine).  Patients are able to view lab/test results, encounter notes, upcoming appointments, etc.  Non-urgent messages can be sent to your provider as well.   To learn more about what you can do with MyChart, go to [email or web address].    Your next appointment:   6 month(s)  Provider:   Tonny Bollman, MD     Other Instructions   1st Floor: - Lobby - Registration  - Pharmacy  - Lab - Cafe  2nd Floor: - PV Lab - Diagnostic Testing (echo, CT, nuclear med)  3rd Floor: - Vacant  4th Floor: - TCTS (cardiothoracic surgery) - AFib Clinic - Structural Heart Clinic - Vascular Surgery  - Vascular Ultrasound  5th Floor: - HeartCare Cardiology (general and EP) - Clinical Pharmacy for coumadin, hypertension, lipid, weight-loss medications, and med management appointments    Valet parking services will be available as well.

## 2023-05-26 NOTE — Progress Notes (Signed)
Cardiology Office Note:    Date:  05/26/2023   ID:  Kyle Avila, DOB 01-08-1946, MRN [id number]  PCP:  Chilton Greathouse, MD   [facility] Providers Cardiologist:  Tonny Bollman, MD Cardiology APP:  Beatrice Lecher, PA-C  Electrophysiologist:  Sherryl Manges, MD     Referring MD: Chilton Greathouse, MD   Chief Complaint  Patient presents with   Coronary Artery Disease    History of Present Illness:    Kyle Avila is a 78 y.o. male with a hx of:  Coronary artery disease S/p CABG in 1988; redo in 1996 S/p multiple PCI procedures (DES x 2 to LCx in 2009, DES to S-PDA in 2016, DES x 2 to S-PDA in 2018, DES to RPDA and cutting balloon POBA to S-RPDA (ISR) 6/21; POBA to both stented segments of S-PDA in 07/2021) LHC 05/21/2022: LM 100, LAD ostial 100, mid 65 (at anastomosis), distal 40 (after anastomosis); RI ostial 100; LM to LCx stent patent, OM1 100; RCA ostial 100 CTO, RPDA stent 95 ISR; SVG-RPDA stent patent with 30 ISR, 40 ISR; SVG-OM1 100 CTO; LIMA-LAD patent>> med Rx (+ Ranexa) HFmrEF (heart failure with mildly reduced ejection fraction)  Ischemic CM TTE 02/26/2022: EF 45-50, mildly reduced RVSF, RVSP 33.9, mild to moderate MR, AV sclerosis, RAP 3   Persistent atrial fibrillation  S/p DCCV 06/2021 >> ERAF Amiodarone DC'd (Lung toxicity; failed DCCV) Completed course of Prednisone  Dr. Judeth Horn ([place]) Mild to mod mitral regurgitation  Carotid artery disease S/p R CEA Carotid US 04/18/2022: Patent R CEA; L ICA 1-39; R VA stenosis CHB, s/p pacemaker S/p Upgrade to CRT 03/2022 Chronic kidney disease Hypertension Hyperlipidemia     The patient called in with some complaints of chest discomfort.  He is able to walk for about 10 minutes before he has chest pressure.  Sometimes he premedicate with the nitroglycerin.  He has longstanding angina but he sent some questions and then we had an him onto my schedule.  He denies shortness of breath with activity.  No  orthopnea or PND.  He has not noticed any acceleration of his anginal symptoms.  He is able to slow down or stop walking and symptoms get better.  He is able to lift light weights at the gym without any exertional symptoms.  He remains compliant with his medications.  He remains compliant with his nephrology follow-up.  States that his memory is pretty bad these days but he engages normally during our conversation.  Current Medications: Current Meds  Medication Sig   acetaminophen (TYLENOL) 500 MG tablet Take 1,000 mg by mouth every 6 (six) hours as needed for moderate pain.   Aflibercept (EYLEA) 2 MG/0.05ML SOLN by Intravitreal route.   allopurinol (ZYLOPRIM) 100 MG tablet Take 100 mg by mouth at bedtime.   amLODipine (NORVASC) 5 MG tablet Take 1 tablet (5 mg total) by mouth daily.   azelastine (ASTELIN) 0.1 % nasal spray Place 1 spray into both nostrils 2 (two) times daily. Use in each nostril as directed   clopidogrel (PLAVIX) 75 MG tablet TAKE 1 TABLET AT BEDTIME   ELIQUIS 5 MG TABS tablet TAKE 1 TABLET TWICE DAILY   empagliflozin (JARDIANCE) 10 MG TABS tablet Take 1 tablet (10 mg total) by mouth daily before breakfast.   Evolocumab (REPATHA SURECLICK) 140 MG/ML SOAJ Inject 140 mg into the skin every 14 (fourteen) days.   furosemide (LASIX) 80 MG tablet TAKE 1 TABLET EVERY DAY   hydrALAZINE (APRESOLINE) 25  MG tablet Take 1 tablet (25 mg total) by mouth 3 (three) times daily.   isosorbide mononitrate (IMDUR) 120 MG 24 hr tablet TAKE 1 TABLET (120 MG TOTAL) BY MOUTH DAILY.   ketoconazole (NIZORAL) 2 % cream Apply 1 application. topically daily as needed (itching toes).   metoprolol succinate (TOPROL-XL) 100 MG 24 hr tablet TAKE 1 TABLET EVERY DAY WITH OR IMMEDIATELY FOLLOWING A MEAL   nitroGLYCERIN (NITROSTAT) 0.4 MG SL tablet Place 1 tablet (0.4 mg total) under the tongue every 5 (five) minutes as needed for chest pain.   ranolazine (RANEXA) 500 MG 12 hr tablet TAKE 1 TABLET TWICE DAILY    tamsulosin (FLOMAX) 0.4 MG CAPS Take 0.4 mg by mouth daily.   [DISCONTINUED] amLODipine (NORVASC) 5 MG tablet Take 1 tablet (5 mg total) by mouth daily.   [DISCONTINUED] hydrALAZINE (APRESOLINE) 25 MG tablet TAKE 1 TABLET THREE TIMES DAILY   [DISCONTINUED] nitroGLYCERIN (NITROSTAT) 0.4 MG SL tablet PLACE 1 TABLET UNDER THE TONGUE EVERY 5 MINUTES AS NEEDED FOR CHEST PAIN     Allergies:   Amiodarone   ROS:   Please see the history of present illness.    All other systems reviewed and are negative.  EKGs/Labs/Other Studies Reviewed:    The following studies were reviewed today: Cardiac Studies & Procedures   CARDIAC CATHETERIZATION  CARDIAC CATHETERIZATION 05/21/2022  Narrative 1.  Known total occlusion of the RCA 2.  Continued patency of the stented segment from the left main into the native circumflex 3.  Known total occlusion of the proximal LAD at the ostium 4.  Continued patency of the LIMA to LAD with moderate stenosis just beyond the insertion site of the mammary artery, unchanged from prior cardiac cath studies over many years 5.  Continued patency of the saphenous vein graft to PDA with only mild in-stent restenosis in the mid and distal bodies of the graft, both lesions treated last year with scoring balloon angioplasty  Recommendations: No targets for PCI.  Continue aggressive medical and antianginal therapies.  Will add Ranexa to his drug regimen as he is already on amlodipine, metoprolol succinate, and high-dose isosorbide.  Findings Coronary Findings Diagnostic  Dominance: Right  Left Main LM lesion is 100% stenosed. The lesion is calcified.  Left Anterior Descending Ost LAD lesion is 100% stenosed. The lesion is calcified. Mid LAD lesion is 65% stenosed. lesion at LIMA anastomosis. slight progression from previous cath study. Mid LAD to Dist LAD lesion is 40% stenosed. There is a 40% lesion in the LAD just beyond the graft insertion site. This does not appear to be  flow-limiting.  Ramus Intermedius Collaterals Ramus filled by collaterals from 2nd Sept.  Ost Ramus to Ramus lesion is 100% stenosed.  Left Circumflex Ost Cx to Prox Cx lesion is 10% stenosed. The lesion was previously treated using a drug eluting stent over 2 years ago.  First Obtuse Marginal Branch 1st Mrg lesion is 100% stenosed.  Right Coronary Artery The RCA is known to be chronically occluded. The vessel is not selectively injected. Ost RCA lesion is 100% stenosed.  Right Posterior Descending Artery Non-stenotic RPDA lesion was previously treated. There is 95% stenosis at the junction of the graft insertion site and PDA branch new from the previous study  Single Graft Graft To RPDA And is large. Prox Graft lesion is 30% stenosed. The lesion was previously treated using a drug eluting stent over 2 years ago. Previously placed stent displays restenosis. Dist Graft lesion is 40% stenosed.  The lesion was previously treated using a drug eluting stent . Previously placed stent displays restenosis.  Single Graft Graft To 1st Mrg Known to be occluded Prox Graft lesion is 100% stenosed.  LIMA LIMA Graft To Mid LAD LIMA and is normal in caliber.  The graft exhibits no disease.  Intervention  No interventions have been documented.   CARDIAC CATHETERIZATION  CARDIAC CATHETERIZATION 08/15/2021  Narrative   LM lesion is 100% stenosed.   Mid LAD to Dist LAD lesion is 40% stenosed.   Mid LAD lesion is 75% stenosed.   Ost LAD lesion is 100% stenosed.   Ost Ramus to Ramus lesion is 100% stenosed.   Ost RCA lesion is 100% stenosed.   Prox Graft lesion is 100% stenosed.   1st Mrg lesion is 100% stenosed.   Dist Graft lesion is 80% stenosed.   Prox Graft lesion is 99% stenosed.   Ost Cx to Prox Cx lesion is 10% stenosed.   Non-stenotic RPDA lesion was previously treated.   Scoring balloon angioplasty was performed using a BALLN WOLVERINE 4.00X10.   Scoring balloon angioplasty  was performed using a BALLN WOLVERINE 4.00X10.   Post intervention, there is a 10% residual stenosis.   Post intervention, there is a 30% residual stenosis.   LIMA and is normal in caliber.   and is large.   The graft exhibits no disease.  Severe triple vessel CAD s/p CABG with 2/3 patent bypass grafts Chronic occlusion of the ostial LAD. The mid and distal LAD fills from the patent LIMA graft. Stable moderate stenosis at the anastomosis of the LIMA gaft to the LAD Chronic occlusion of the intermediate branch that fills from left to left collaterals supplied by the LIMA to LAD. Patent proximal and mid Circumflex stents. Known occlusion of SVG to OM (non injected) Chronic occlusion ostial RCA (non injected) Patent SVG to PDA. Severe stenosis in the stented segment in the proximal SVG and severe stenosis in the stented segment in the distal SVG. Successful PTCA with scoring balloon angioplasty of both stented segments in the SVG to PDA Normal LVEDP  Recommendations: Continue Plavix. I will have him take ASA in the am but this can be stopped once his Eliquis is resumed.  Findings Coronary Findings Diagnostic  Dominance: Right  Left Main LM lesion is 100% stenosed. The lesion is calcified.  Left Anterior Descending Ost LAD lesion is 100% stenosed. The lesion is calcified. Mid LAD lesion is 75% stenosed. lesion at LIMA anastomosis. slight progression from previous cath study. Mid LAD to Dist LAD lesion is 40% stenosed. There is a 40% lesion in the LAD just beyond the graft insertion site. This does not appear to be flow-limiting.  Ramus Intermedius Collaterals Ramus filled by collaterals from 2nd Sept.  Ost Ramus to Ramus lesion is 100% stenosed.  Left Circumflex Ost Cx to Prox Cx lesion is 10% stenosed. The lesion was previously treated using a drug eluting stent over 2 years ago.  First Obtuse Marginal Branch 1st Mrg lesion is 100% stenosed.  Right Coronary Artery The RCA is  known to be chronically occluded. The vessel is not selectively injected. Ost RCA lesion is 100% stenosed.  Right Posterior Descending Artery Non-stenotic RPDA lesion was previously treated. There is 95% stenosis at the junction of the graft insertion site and PDA branch new from the previous study  Single Graft Graft To RPDA And is large. Prox Graft lesion is 99% stenosed. The lesion was previously treated using a drug  eluting stent over 2 years ago. Previously placed stent displays restenosis. Dist Graft lesion is 80% stenosed. The lesion was previously treated using a drug eluting stent . Previously placed stent displays restenosis.  Single Graft Graft To 1st Mrg Known to be occluded Prox Graft lesion is 100% stenosed.  LIMA LIMA Graft To Mid LAD LIMA and is normal in caliber.  The graft exhibits no disease.  Intervention  Prox Graft lesion (Single Graft Graft To RPDA) Angioplasty CATHETER LAUNCHER 6FR MP1 guide catheter was inserted. WIRE COUGAR XT STRL 190CM guidewire used to cross lesion. Scoring balloon angioplasty was performed using a BALLN WOLVERINE 4.00X10. Post-Intervention Lesion Assessment The intervention was successful. Pre-interventional TIMI flow is 3. Post-intervention TIMI flow is 3. No complications occurred at this lesion. There is a 10% residual stenosis post intervention.  Dist Graft lesion (Single Graft Graft To RPDA) Angioplasty CATHETER LAUNCHER 6FR MP1 guide catheter was inserted. WIRE COUGAR XT STRL 190CM guidewire used to cross lesion. Scoring balloon angioplasty was performed using a BALLN WOLVERINE 4.00X10. Post-Intervention Lesion Assessment The intervention was successful. Pre-interventional TIMI flow is 3. Post-intervention TIMI flow is 3. No complications occurred at this lesion. There is a 30% residual stenosis post intervention.    ECHOCARDIOGRAM  ECHOCARDIOGRAM COMPLETE 01/06/2023  Narrative ECHOCARDIOGRAM REPORT    Patient Name:    Kyle Avila Date of Exam: 01/06/2023 Medical Rec #:  [id number]       Height:       69.0 in Accession #:    [id number]      Weight:       163.6 lb Date of Birth:  November 10, 1945       BSA:          1.897 m Patient Age:    1 years        BP:           146/65 mmHg Patient Gender: M               HR:           61 bpm. Exam Location:  Outpatient  Procedure: 2D Echo, 3D Echo, Cardiac Doppler, Color Doppler and Strain Analysis  Indications:    I50.21 CHF  History:        Patient has prior history of Echocardiogram examinations, most recent 02/26/2022. HFmrEF, Ischemic cardiomyopathy, CAD, Pacemaker and Prior CABG, Carotid Disease, Arrythmias:CHB; Risk Factors:Hypertension and Dyslipidemia. Previous echo revealed LVEF 50% mild to moderate MR and PAP 33 mmHg.  Sonographer:    Chanetta Marshall BA, RDCS Referring Phys: Chilton Greathouse  IMPRESSIONS   1. Left ventricular ejection fraction, by estimation, is 45 to 50%. The left ventricle has mildly decreased function. The left ventricle demonstrates regional wall motion abnormalities (see scoring diagram/findings for description). There is mild asymmetric left ventricular hypertrophy. Left ventricular diastolic parameters are indeterminate. There is moderate hypokinesis of the left ventricular, basal-mid inferolateral wall. There is mild hypokinesis of the left ventricular, basal anteroseptal wall, inferoseptal wall, anterior wall and inferior wall. 2. Right ventricular systolic function is normal. The right ventricular size is normal. There is normal pulmonary artery systolic pressure. 3. Left atrial size was mildly dilated. 4. The mitral valve is normal in structure. Mild mitral valve regurgitation. No evidence of mitral stenosis. 5. The aortic valve is calcified. There is moderate calcification of the aortic valve. There is moderate thickening of the aortic valve. Aortic valve regurgitation is not visualized. Mild aortic valve stenosis. 6. The  inferior vena cava is  dilated in size with >50% respiratory variability, suggesting right atrial pressure of 8 mmHg.  Comparison(s): No significant change from prior study.  Conclusion(s)/Recommendation(s): Strain shows "cherry on top" pattern, cannot exclude amyloid as etiology. Consider PYP scan if clinically appropriate.  FINDINGS Left Ventricle: Left ventricular ejection fraction, by estimation, is 45 to 50%. The left ventricle has mildly decreased function. The left ventricle demonstrates regional wall motion abnormalities. Moderate hypokinesis of the left ventricular, basal-mid inferolateral wall. Mild hypokinesis of the left ventricular, basal anteroseptal wall, inferoseptal wall, anterior wall and inferior wall. The left ventricular internal cavity size was normal in size. There is mild asymmetric left ventricular hypertrophy. Abnormal (paradoxical) septal motion, consistent with RV pacemaker. Left ventricular diastolic parameters are indeterminate.  Right Ventricle: The right ventricular size is normal. No increase in right ventricular wall thickness. Right ventricular systolic function is normal. There is normal pulmonary artery systolic pressure. The tricuspid regurgitant velocity is 2.50 m/s, and with an assumed right atrial pressure of 8 mmHg, the estimated right ventricular systolic pressure is 33.0 mmHg.  Left Atrium: Left atrial size was mildly dilated.  Right Atrium: Right atrial size was normal in size.  Pericardium: There is no evidence of pericardial effusion.  Mitral Valve: The mitral valve is normal in structure. Mild mitral valve regurgitation. No evidence of mitral valve stenosis.  Tricuspid Valve: The tricuspid valve is normal in structure. Tricuspid valve regurgitation is mild . No evidence of tricuspid stenosis.  Aortic Valve: The aortic valve is calcified. There is moderate calcification of the aortic valve. There is moderate thickening of the aortic valve. Aortic  valve regurgitation is not visualized. Mild aortic stenosis is present. Aortic valve mean gradient measures 16.0 mmHg. Aortic valve peak gradient measures 30.7 mmHg. Aortic valve area, by VTI measures 1.25 cm.  Pulmonic Valve: The pulmonic valve was grossly normal. Pulmonic valve regurgitation is mild. No evidence of pulmonic stenosis.  Aorta: The aortic root, ascending aorta and aortic arch are all structurally normal, with no evidence of dilitation or obstruction.  Venous: The inferior vena cava is dilated in size with greater than 50% respiratory variability, suggesting right atrial pressure of 8 mmHg.  IAS/Shunts: The atrial septum is grossly normal.  Additional Comments: A device lead is visualized.   LEFT VENTRICLE PLAX 2D LVIDd:         4.90 cm   Diastology LVIDs:         4.30 cm   LV e' medial:    4.35 cm/s LV PW:         1.40 cm   LV E/e' medial:  13.8 LV IVS:        1.10 cm   LV e' lateral:   8.70 cm/s LVOT diam:     2.10 cm   LV E/e' lateral: 6.9 LV SV:         73 LV SV Index:   39        2D Longitudinal Strain LVOT Area:     3.46 cm  2D Strain GLS (A2C):   -10.6 % 2D Strain GLS (A3C):   -11.2 % 2D Strain GLS (A4C):   -11.1 % 2D Strain GLS Avg:     -11.0 %  3D Volume EF: 3D EF:        45 % LV EDV:       138 ml LV ESV:       76 ml LV SV:        63 ml  RIGHT VENTRICLE  IVC RV Basal diam:  3.90 cm    IVC diam: 2.10 cm RV Mid diam:    3.20 cm RV S prime:     8.92 cm/s TAPSE (M-mode): 1.9 cm RVSP:           28.0 mmHg  LEFT ATRIUM             Index        RIGHT ATRIUM           Index LA diam:        4.30 cm 2.27 cm/m   RA Pressure: 3.00 mmHg LA Vol (A2C):   59.8 ml 31.52 ml/m  RA Area:     18.20 cm LA Vol (A4C):   48.5 ml 25.57 ml/m  RA Volume:   46.00 ml  24.25 ml/m LA Biplane Vol: 55.7 ml 29.36 ml/m AORTIC VALVE AV Area (Vmax):    1.23 cm AV Area (Vmean):   1.33 cm AV Area (VTI):     1.25 cm AV Vmax:           277.00 cm/s AV Vmean:           172.500 cm/s AV VTI:            0.586 m AV Peak Grad:      30.7 mmHg AV Mean Grad:      16.0 mmHg LVOT Vmax:         98.50 cm/s LVOT Vmean:        66.000 cm/s LVOT VTI:          0.211 m LVOT/AV VTI ratio: 0.36  AORTA Ao Root diam: 3.70 cm Ao Asc diam:  3.20 cm  MITRAL VALVE               TRICUSPID VALVE MV Area (PHT): 3.19 cm    TR Peak grad:   25.0 mmHg MV Decel Time: 238 msec    TR Vmax:        250.00 cm/s MV E velocity: 60.10 cm/s  Estimated RAP:  3.00 mmHg MV A velocity: 35.50 cm/s  RVSP:           28.0 mmHg MV E/A ratio:  1.69 SHUNTS Systemic VTI:  0.21 m Systemic Diam: 2.10 cm  Jodelle Red MD Electronically signed by Jodelle Red MD Signature Date/Time: 01/06/2023/2:19:02 PM    Final             EKG:   EKG Interpretation Date/Time:  Tuesday May 26 2023 11:12:00 EST Ventricular Rate:  62 PR Interval:  198 QRS Duration:  178 QT Interval:  492 QTC Calculation: 499 R Axis:   -83  Text Interpretation: AV dual-paced rhythm When compared with ECG of 04-Nov-2022 16:47, No significant change was found Confirmed by Tonny Bollman [phone]) on 05/26/2023 11:23:00 AM    Recent Labs: 12/26/2022: B Natriuretic Peptide 407.0; BUN 44; Creatinine, Ser 2.72; Potassium 4.3; Sodium 139  Recent Lipid Panel    Component Value Date/Time   CHOL 96 (L) 04/07/2022 0915   TRIG 145 04/07/2022 0915   HDL 57 04/07/2022 0915   CHOLHDL 1.7 04/07/2022 0915   CHOLHDL 2.4 10/19/2019 0306   VLDL 55 (H) 10/19/2019 0306   LDLCALC 15 04/07/2022 0915   LDLDIRECT 47 10/28/2019 0820     Risk Assessment/Calculations:    CHA2DS2-VASc Score = 5   This indicates a 7.2% annual risk of stroke. [name] score is based upon: CHF History: 1 HTN History: 1 Diabetes History: 0 Stroke History:  0 Vascular Disease History: 1 Age Score: 2 Gender Score: 0           Physical Exam:    VS:  BP (!) 142/82   Pulse 62   Ht 5\' 9"  (1.753 m)   Wt 170 lb 3.2 oz (77.2  kg)   SpO2 96%   BMI 25.13 kg/m     Wt Readings from Last 3 Encounters:  05/26/23 170 lb 3.2 oz (77.2 kg)  01/26/23 165 lb 9.6 oz (75.1 kg)  12/26/22 163 lb 9.6 oz (74.2 kg)     GEN:  Well nourished, well developed in no acute distress HEENT: Normal NECK: No JVD; No carotid bruits LYMPHATICS: No lymphadenopathy CARDIAC: RRR, 2/6 systolic murmur at the right upper sternal border, no diastolic murmur RESPIRATORY:  Clear to auscultation without rales, wheezing or rhonchi  ABDOMEN: Soft, non-tender, non-distended MUSCULOSKELETAL:  No edema; No deformity  SKIN: Warm and dry NEUROLOGIC:  Alert and oriented x 3 PSYCHIATRIC:  Normal affect   Assessment & Plan Coronary artery disease involving native coronary artery of native heart with angina pectoris (HCC) The patient has stable angina on long-term clopidogrel.  He is on maximally tolerated medical therapy with a combination of isosorbide at high dose, metoprolol succinate, amlodipine, and ranolazine.  I think we should continue to manage him medically for his coronary artery disease.  I think if we continue to repeatedly do heart catheterizations we will accelerate his advanced chronic kidney disease and need for dialysis.  Certainly, if he develops an unstable coronary syndrome or worsening symptoms we can consider cardiac catheterization but I favor a conservative approach with medical therapy.  All of this is reviewed with him today and he is in agreement. CKD (chronic kidney disease) stage 4, GFR 15-29 ml/min (HCC) Stable renal function on most recent labs.  Last creatinine I have on file from October 2024 is 2.35.  Followed closely by nephrology. Persistent atrial fibrillation (HCC) Anticoagulated with apixaban.  Patient with permanent pacemaker in place.  No issues with elevated heart rates.  He is pacemaker dependent. Mixed hyperlipidemia Treated with Repatha.  Last lipids with an LDL cholesterol of 58.            Medication  Adjustments/Labs and Tests Ordered: Current medicines are reviewed at length with the patient today.  Concerns regarding medicines are outlined above.  Orders Placed This Encounter  Procedures   EKG 12-Lead   Meds ordered this encounter  Medications   amLODipine (NORVASC) 5 MG tablet    Sig: Take 1 tablet (5 mg total) by mouth daily.    Dispense:  90 tablet    Refill:  3   hydrALAZINE (APRESOLINE) 25 MG tablet    Sig: Take 1 tablet (25 mg total) by mouth 3 (three) times daily.    Dispense:  270 tablet    Refill:  3   nitroGLYCERIN (NITROSTAT) 0.4 MG SL tablet    Sig: Place 1 tablet (0.4 mg total) under the tongue every 5 (five) minutes as needed for chest pain.    Dispense:  75 tablet    Refill:  3    Patient Instructions  Follow-Up: At [facility], you and your health needs are our priority.  As part of our continuing mission to provide you with exceptional heart care, we have created designated Provider Care Teams.  These Care Teams include your primary Cardiologist (physician) and Advanced Practice Providers (APPs -  Physician Assistants and Nurse Practitioners) who all work  together to provide you with the care you need, when you need it.  We recommend signing up for the patient portal called "MyChart".  Sign up information is provided on this After Visit Summary.  MyChart is used to connect with patients for Virtual Visits (Telemedicine).  Patients are able to view lab/test results, encounter notes, upcoming appointments, etc.  Non-urgent messages can be sent to your provider as well.   To learn more about what you can do with MyChart, go to [email or web address].    Your next appointment:   6 month(s)  Provider:   Tonny Bollman, MD     Other Instructions   1st Floor: - Lobby - Registration  - Pharmacy  - Lab - Cafe  2nd Floor: - PV Lab - Diagnostic Testing (echo, CT, nuclear med)  3rd Floor: - Vacant  4th Floor: - TCTS (cardiothoracic  surgery) - AFib Clinic - Structural Heart Clinic - Vascular Surgery  - Vascular Ultrasound  5th Floor: - HeartCare Cardiology (general and EP) - Clinical Pharmacy for coumadin, hypertension, lipid, weight-loss medications, and med management appointments    Valet parking services will be available as well.          Signed, Tonny Bollman, MD  05/26/2023 5:28 PM    [facility]

## 2023-05-26 NOTE — Assessment & Plan Note (Signed)
Treated with Repatha.  Last lipids with an LDL cholesterol of 58.

## 2023-05-26 NOTE — Assessment & Plan Note (Signed)
Stable renal function on most recent labs.  Last creatinine I have on file from October 2024 is 2.35.  Followed closely by nephrology.

## 2023-05-26 NOTE — Assessment & Plan Note (Signed)
The patient has stable angina on long-term clopidogrel.  He is on maximally tolerated medical therapy with a combination of isosorbide at high dose, metoprolol succinate, amlodipine, and ranolazine.  I think we should continue to manage him medically for his coronary artery disease.  I think if we continue to repeatedly do heart catheterizations we will accelerate his advanced chronic kidney disease and need for dialysis.  Certainly, if he develops an unstable coronary syndrome or worsening symptoms we can consider cardiac catheterization but I favor a conservative approach with medical therapy.  All of this is reviewed with him today and he is in agreement.

## 2023-06-16 DIAGNOSIS — I480 Paroxysmal atrial fibrillation: Secondary | ICD-10-CM | POA: Diagnosis not present

## 2023-06-16 DIAGNOSIS — J302 Other seasonal allergic rhinitis: Secondary | ICD-10-CM | POA: Diagnosis not present

## 2023-06-16 DIAGNOSIS — R058 Other specified cough: Secondary | ICD-10-CM | POA: Diagnosis not present

## 2023-06-16 DIAGNOSIS — I5022 Chronic systolic (congestive) heart failure: Secondary | ICD-10-CM | POA: Diagnosis not present

## 2023-06-16 DIAGNOSIS — J189 Pneumonia, unspecified organism: Secondary | ICD-10-CM | POA: Diagnosis not present

## 2023-06-16 DIAGNOSIS — N184 Chronic kidney disease, stage 4 (severe): Secondary | ICD-10-CM | POA: Diagnosis not present

## 2023-06-16 DIAGNOSIS — M329 Systemic lupus erythematosus, unspecified: Secondary | ICD-10-CM | POA: Diagnosis not present

## 2023-06-22 ENCOUNTER — Ambulatory Visit (INDEPENDENT_AMBULATORY_CARE_PROVIDER_SITE_OTHER): Payer: Medicare HMO

## 2023-06-22 DIAGNOSIS — I442 Atrioventricular block, complete: Secondary | ICD-10-CM

## 2023-06-24 DIAGNOSIS — H353211 Exudative age-related macular degeneration, right eye, with active choroidal neovascularization: Secondary | ICD-10-CM | POA: Diagnosis not present

## 2023-06-24 LAB — CUP PACEART REMOTE DEVICE CHECK
Battery Remaining Longevity: 106 mo
Battery Voltage: 3.01 V
Brady Statistic AP VP Percent: 91.81 %
Brady Statistic AP VS Percent: 0.15 %
Brady Statistic AS VP Percent: 7.74 %
Brady Statistic AS VS Percent: 0.3 %
Brady Statistic RA Percent Paced: 92.09 %
Brady Statistic RV Percent Paced: 99.55 %
Date Time Interrogation Session: 20250225173848
Implantable Lead Connection Status: 753985
Implantable Lead Connection Status: 753985
Implantable Lead Connection Status: 753985
Implantable Lead Implant Date: 20070831
Implantable Lead Implant Date: 20070831
Implantable Lead Implant Date: 20231227
Implantable Lead Location: 753858
Implantable Lead Location: 753859
Implantable Lead Location: 753860
Implantable Lead Model: 3830
Implantable Lead Model: 4469
Implantable Lead Model: 4470
Implantable Lead Serial Number: 485949
Implantable Lead Serial Number: 553665
Implantable Pulse Generator Implant Date: 20231227
Lead Channel Impedance Value: 228 Ohm
Lead Channel Impedance Value: 266 Ohm
Lead Channel Impedance Value: 285 Ohm
Lead Channel Impedance Value: 304 Ohm
Lead Channel Impedance Value: 342 Ohm
Lead Channel Impedance Value: 342 Ohm
Lead Channel Impedance Value: 380 Ohm
Lead Channel Impedance Value: 456 Ohm
Lead Channel Impedance Value: 494 Ohm
Lead Channel Pacing Threshold Amplitude: 0.625 V
Lead Channel Pacing Threshold Amplitude: 0.75 V
Lead Channel Pacing Threshold Amplitude: 1.125 V
Lead Channel Pacing Threshold Pulse Width: 0.4 ms
Lead Channel Pacing Threshold Pulse Width: 0.4 ms
Lead Channel Pacing Threshold Pulse Width: 0.4 ms
Lead Channel Sensing Intrinsic Amplitude: 1 mV
Lead Channel Sensing Intrinsic Amplitude: 1 mV
Lead Channel Sensing Intrinsic Amplitude: 27.125 mV
Lead Channel Sensing Intrinsic Amplitude: 7.25 mV
Lead Channel Setting Pacing Amplitude: 1.25 V
Lead Channel Setting Pacing Amplitude: 1.5 V
Lead Channel Setting Pacing Amplitude: 2.25 V
Lead Channel Setting Pacing Pulse Width: 0.4 ms
Lead Channel Setting Pacing Pulse Width: 0.4 ms
Lead Channel Setting Sensing Sensitivity: 2.8 mV
Zone Setting Status: 755011
Zone Setting Status: 755011

## 2023-06-27 ENCOUNTER — Other Ambulatory Visit: Payer: Self-pay | Admitting: Physician Assistant

## 2023-07-07 DIAGNOSIS — J189 Pneumonia, unspecified organism: Secondary | ICD-10-CM | POA: Diagnosis not present

## 2023-07-13 ENCOUNTER — Ambulatory Visit: Payer: Medicare HMO | Admitting: Cardiovascular Disease

## 2023-07-14 ENCOUNTER — Encounter: Payer: Self-pay | Admitting: Internal Medicine

## 2023-07-29 NOTE — Addendum Note (Signed)
 Addended by: Geralyn Flash D on: 07/29/2023 11:25 AM   Modules accepted: Orders

## 2023-07-29 NOTE — Progress Notes (Signed)
 Remote pacemaker transmission.

## 2023-07-30 DIAGNOSIS — N184 Chronic kidney disease, stage 4 (severe): Secondary | ICD-10-CM | POA: Diagnosis not present

## 2023-08-05 DIAGNOSIS — N184 Chronic kidney disease, stage 4 (severe): Secondary | ICD-10-CM | POA: Diagnosis not present

## 2023-08-05 DIAGNOSIS — R053 Chronic cough: Secondary | ICD-10-CM | POA: Diagnosis not present

## 2023-08-05 DIAGNOSIS — R059 Cough, unspecified: Secondary | ICD-10-CM | POA: Diagnosis not present

## 2023-08-05 DIAGNOSIS — F0150 Vascular dementia without behavioral disturbance: Secondary | ICD-10-CM | POA: Diagnosis not present

## 2023-08-05 DIAGNOSIS — I130 Hypertensive heart and chronic kidney disease with heart failure and stage 1 through stage 4 chronic kidney disease, or unspecified chronic kidney disease: Secondary | ICD-10-CM | POA: Diagnosis not present

## 2023-08-05 DIAGNOSIS — J309 Allergic rhinitis, unspecified: Secondary | ICD-10-CM | POA: Diagnosis not present

## 2023-08-05 DIAGNOSIS — I480 Paroxysmal atrial fibrillation: Secondary | ICD-10-CM | POA: Diagnosis not present

## 2023-08-05 DIAGNOSIS — D692 Other nonthrombocytopenic purpura: Secondary | ICD-10-CM | POA: Diagnosis not present

## 2023-08-05 DIAGNOSIS — I5022 Chronic systolic (congestive) heart failure: Secondary | ICD-10-CM | POA: Diagnosis not present

## 2023-08-05 DIAGNOSIS — I25118 Atherosclerotic heart disease of native coronary artery with other forms of angina pectoris: Secondary | ICD-10-CM | POA: Diagnosis not present

## 2023-08-05 DIAGNOSIS — K219 Gastro-esophageal reflux disease without esophagitis: Secondary | ICD-10-CM | POA: Diagnosis not present

## 2023-08-06 DIAGNOSIS — R0902 Hypoxemia: Secondary | ICD-10-CM | POA: Diagnosis not present

## 2023-08-06 DIAGNOSIS — N2581 Secondary hyperparathyroidism of renal origin: Secondary | ICD-10-CM | POA: Diagnosis not present

## 2023-08-06 DIAGNOSIS — R809 Proteinuria, unspecified: Secondary | ICD-10-CM | POA: Diagnosis not present

## 2023-08-06 DIAGNOSIS — N184 Chronic kidney disease, stage 4 (severe): Secondary | ICD-10-CM | POA: Diagnosis not present

## 2023-08-06 DIAGNOSIS — E875 Hyperkalemia: Secondary | ICD-10-CM | POA: Diagnosis not present

## 2023-08-06 DIAGNOSIS — I4811 Longstanding persistent atrial fibrillation: Secondary | ICD-10-CM | POA: Diagnosis not present

## 2023-08-06 DIAGNOSIS — I5040 Unspecified combined systolic (congestive) and diastolic (congestive) heart failure: Secondary | ICD-10-CM | POA: Diagnosis not present

## 2023-08-06 DIAGNOSIS — I130 Hypertensive heart and chronic kidney disease with heart failure and stage 1 through stage 4 chronic kidney disease, or unspecified chronic kidney disease: Secondary | ICD-10-CM | POA: Diagnosis not present

## 2023-08-12 DIAGNOSIS — H353211 Exudative age-related macular degeneration, right eye, with active choroidal neovascularization: Secondary | ICD-10-CM | POA: Diagnosis not present

## 2023-08-21 ENCOUNTER — Encounter: Payer: Self-pay | Admitting: *Deleted

## 2023-08-21 ENCOUNTER — Encounter: Payer: Self-pay | Admitting: Cardiovascular Disease

## 2023-08-27 ENCOUNTER — Other Ambulatory Visit (HOSPITAL_COMMUNITY): Payer: Self-pay

## 2023-08-27 ENCOUNTER — Telehealth (HOSPITAL_COMMUNITY): Payer: Self-pay

## 2023-08-27 MED ORDER — EMPAGLIFLOZIN 10 MG PO TABS: 10.0000 mg | ORAL_TABLET | Freq: Every day | ORAL | 3 refills | Status: DC

## 2023-08-27 NOTE — Telephone Encounter (Signed)
 Spoke with [facility], provided grant billing information, confirmed $0 copay for 90 days. Informed patient by phone.

## 2023-08-27 NOTE — Telephone Encounter (Signed)
 Advanced Heart Failure Patient Advocate Encounter  Patient called to explain that he will be travelling for most of this month and he is concerned about running low / out of medication. Test billing shows that 90 days can be filled at this time, and [employer] grant will cover cost to $0.  Requesting [facility] rx [facility] [place], I will call to provide grant information if needed and confirm $0 copay.  Kennis Peacock, CPhT Rx Patient Advocate Phone: [phone]

## 2023-08-28 DIAGNOSIS — Z85828 Personal history of other malignant neoplasm of skin: Secondary | ICD-10-CM | POA: Diagnosis not present

## 2023-08-28 DIAGNOSIS — L821 Other seborrheic keratosis: Secondary | ICD-10-CM | POA: Diagnosis not present

## 2023-08-28 DIAGNOSIS — L570 Actinic keratosis: Secondary | ICD-10-CM | POA: Diagnosis not present

## 2023-08-28 DIAGNOSIS — D225 Melanocytic nevi of trunk: Secondary | ICD-10-CM | POA: Diagnosis not present

## 2023-08-31 ENCOUNTER — Telehealth (HOSPITAL_COMMUNITY): Payer: Self-pay

## 2023-08-31 NOTE — Telephone Encounter (Signed)
 Advanced Heart Failure Patient Advocate Encounter  Attempted to renew patients [employer] grant as we had previously discussed; grant has already been renewed and is active until 08/29/2024. Billing information as follows:  BIN [id number] PCN [id number] ID [id number]  Patient informed by phone.  Kennis Peacock, CPhT Rx Patient Advocate Phone: [phone]

## 2023-09-22 ENCOUNTER — Ambulatory Visit: Payer: Medicare HMO

## 2023-09-22 ENCOUNTER — Other Ambulatory Visit: Payer: Self-pay | Admitting: Cardiovascular Disease

## 2023-09-22 DIAGNOSIS — I442 Atrioventricular block, complete: Secondary | ICD-10-CM

## 2023-09-22 MED ORDER — REPATHA SURECLICK 140 MG/ML ~~LOC~~ SOAJ: 140.0000 mg | SUBCUTANEOUS | 3 refills | Status: AC

## 2023-09-23 ENCOUNTER — Other Ambulatory Visit: Payer: Self-pay | Admitting: Cardiovascular Disease

## 2023-09-23 ENCOUNTER — Ambulatory Visit: Payer: Self-pay | Admitting: Cardiology

## 2023-09-23 LAB — CUP PACEART REMOTE DEVICE CHECK
Battery Remaining Longevity: 111 mo
Battery Voltage: 3.01 V
Brady Statistic AP VP Percent: 87.83 %
Brady Statistic AP VS Percent: 0.33 %
Brady Statistic AS VP Percent: 11.08 %
Brady Statistic AS VS Percent: 0.75 %
Brady Statistic RA Percent Paced: 88.53 %
Brady Statistic RV Percent Paced: 98.91 %
Date Time Interrogation Session: 20250528102522
Implantable Lead Connection Status: 753985
Implantable Lead Connection Status: 753985
Implantable Lead Connection Status: 753985
Implantable Lead Implant Date: 20070831
Implantable Lead Implant Date: 20070831
Implantable Lead Implant Date: 20231227
Implantable Lead Location: 753858
Implantable Lead Location: 753859
Implantable Lead Location: 753860
Implantable Lead Model: 3830
Implantable Lead Model: 4469
Implantable Lead Model: 4470
Implantable Lead Serial Number: 485949
Implantable Lead Serial Number: 553665
Implantable Pulse Generator Implant Date: 20231227
Lead Channel Impedance Value: 247 Ohm
Lead Channel Impedance Value: 266 Ohm
Lead Channel Impedance Value: 304 Ohm
Lead Channel Impedance Value: 304 Ohm
Lead Channel Impedance Value: 342 Ohm
Lead Channel Impedance Value: 342 Ohm
Lead Channel Impedance Value: 380 Ohm
Lead Channel Impedance Value: 437 Ohm
Lead Channel Impedance Value: 494 Ohm
Lead Channel Pacing Threshold Amplitude: 0.5 V
Lead Channel Pacing Threshold Amplitude: 0.625 V
Lead Channel Pacing Threshold Amplitude: 0.625 V
Lead Channel Pacing Threshold Pulse Width: 0.4 ms
Lead Channel Pacing Threshold Pulse Width: 0.4 ms
Lead Channel Pacing Threshold Pulse Width: 0.4 ms
Lead Channel Sensing Intrinsic Amplitude: 0.75 mV
Lead Channel Sensing Intrinsic Amplitude: 0.75 mV
Lead Channel Sensing Intrinsic Amplitude: 27.125 mV
Lead Channel Sensing Intrinsic Amplitude: 7.25 mV
Lead Channel Setting Pacing Amplitude: 1.25 V
Lead Channel Setting Pacing Amplitude: 1.5 V
Lead Channel Setting Pacing Amplitude: 2 V
Lead Channel Setting Pacing Pulse Width: 0.4 ms
Lead Channel Setting Pacing Pulse Width: 0.4 ms
Lead Channel Setting Sensing Sensitivity: 2.8 mV
Zone Setting Status: 755011
Zone Setting Status: 755011

## 2023-09-25 DIAGNOSIS — I130 Hypertensive heart and chronic kidney disease with heart failure and stage 1 through stage 4 chronic kidney disease, or unspecified chronic kidney disease: Secondary | ICD-10-CM | POA: Diagnosis not present

## 2023-09-25 DIAGNOSIS — N184 Chronic kidney disease, stage 4 (severe): Secondary | ICD-10-CM | POA: Diagnosis not present

## 2023-09-25 DIAGNOSIS — I5022 Chronic systolic (congestive) heart failure: Secondary | ICD-10-CM | POA: Diagnosis not present

## 2023-09-25 DIAGNOSIS — J3489 Other specified disorders of nose and nasal sinuses: Secondary | ICD-10-CM | POA: Diagnosis not present

## 2023-10-07 DIAGNOSIS — H353211 Exudative age-related macular degeneration, right eye, with active choroidal neovascularization: Secondary | ICD-10-CM | POA: Diagnosis not present

## 2023-10-13 ENCOUNTER — Encounter: Payer: Self-pay | Admitting: Cardiovascular Disease

## 2023-10-26 ENCOUNTER — Other Ambulatory Visit: Payer: Self-pay

## 2023-10-26 MED ORDER — METOPROLOL SUCCINATE ER 100 MG PO TB24: 100.0000 mg | ORAL_TABLET | Freq: Every day | ORAL | 1 refills | Status: DC

## 2023-11-04 DIAGNOSIS — N184 Chronic kidney disease, stage 4 (severe): Secondary | ICD-10-CM | POA: Diagnosis not present

## 2023-11-07 IMAGING — DX DG CHEST 2V
2 series · 2 of 2 positions shown · non-contrast
Comparison: None.

CLINICAL DATA: Dull chest pain, some weakness.

EXAM:
CHEST - 2 VIEW

[chest pa]
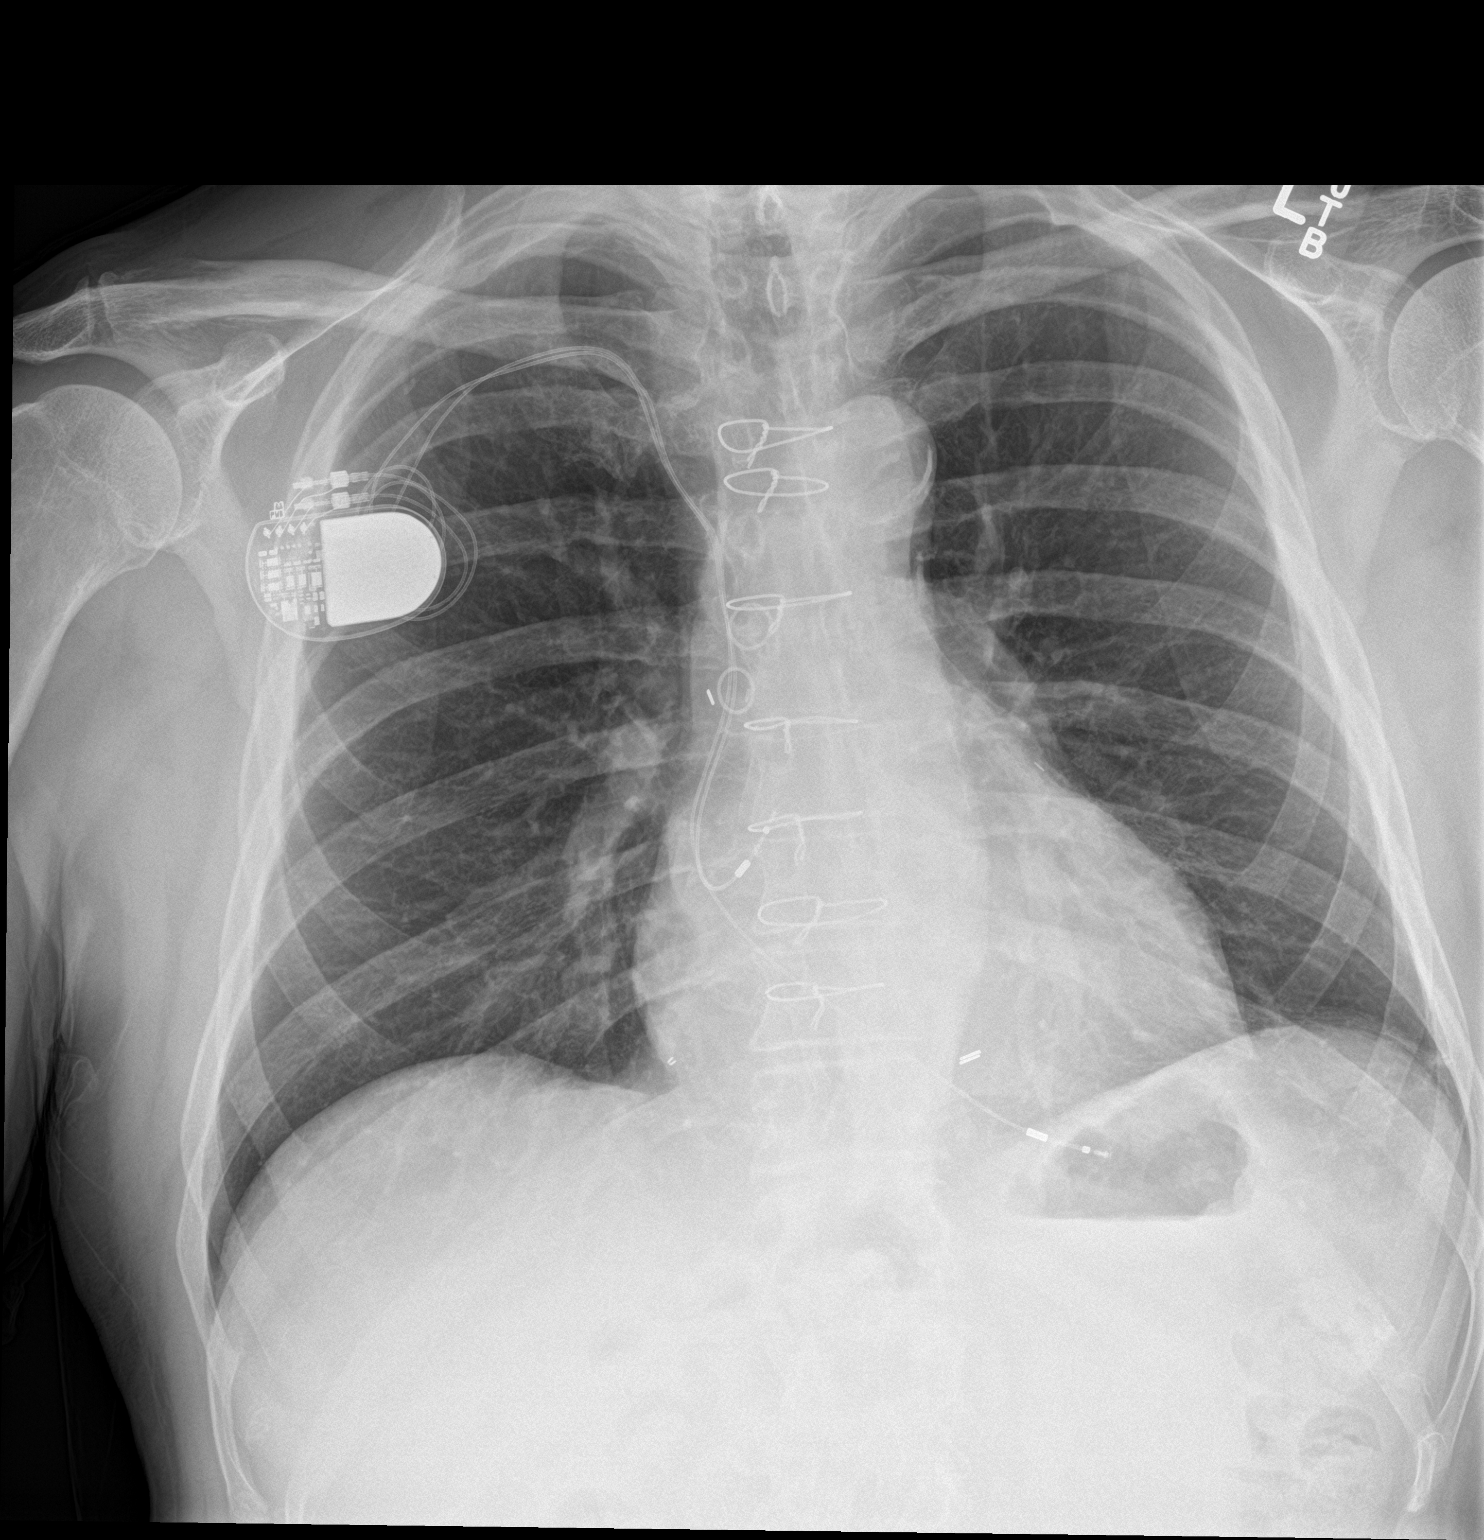

[chest lat]
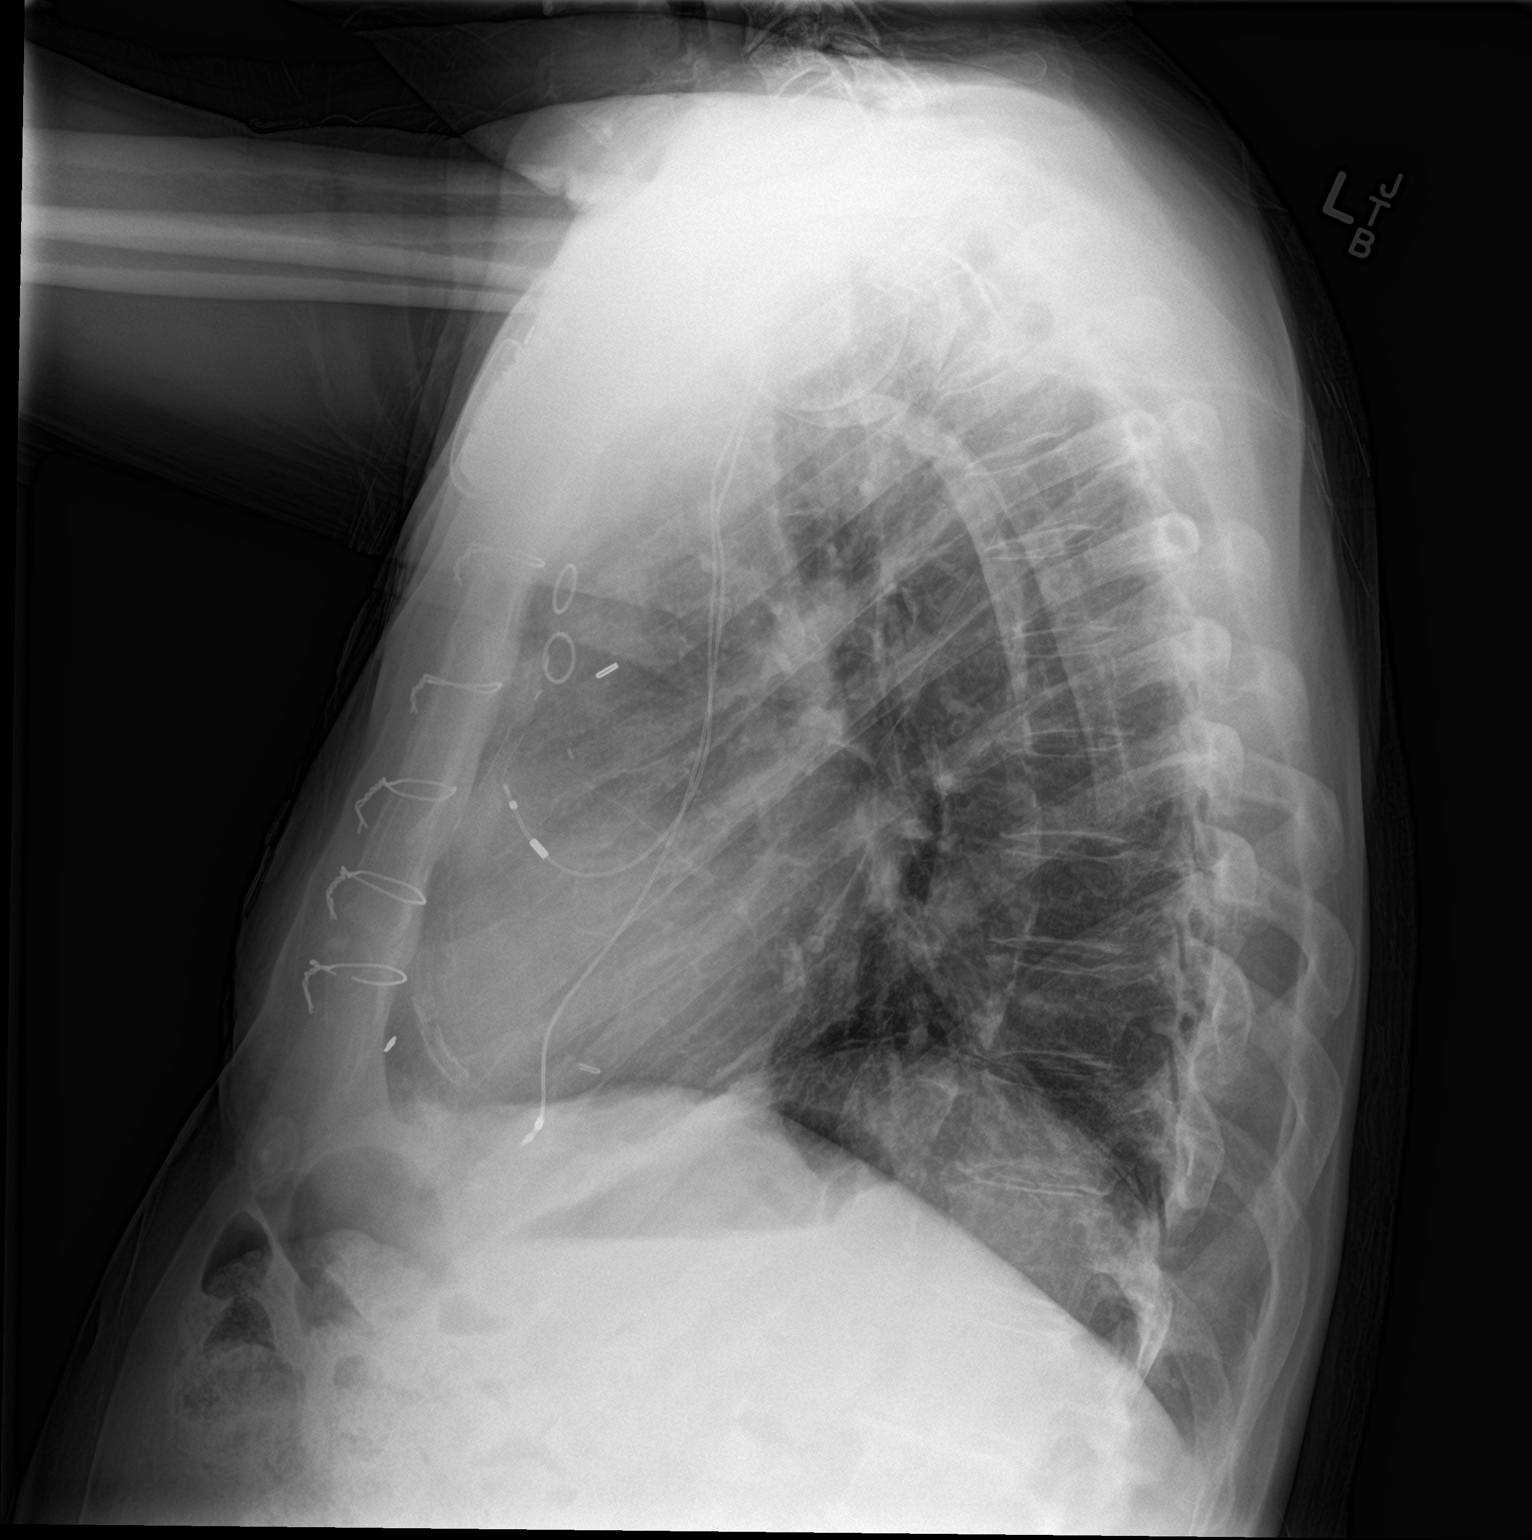

[2 of 2 positions shown; findings below may reference images not displayed]

FINDINGS: RIGHT - pacer. Midline sternotomy. Normal mediastinum and cardiac
silhouette. Normal pulmonary vasculature. No evidence of effusion,
infiltrate, or pneumothorax. No acute bony abnormality.
IMPRESSION: No active cardiopulmonary disease.

## 2023-11-11 DIAGNOSIS — H353211 Exudative age-related macular degeneration, right eye, with active choroidal neovascularization: Secondary | ICD-10-CM | POA: Diagnosis not present

## 2023-11-11 DIAGNOSIS — H353121 Nonexudative age-related macular degeneration, left eye, early dry stage: Secondary | ICD-10-CM | POA: Diagnosis not present

## 2023-11-11 DIAGNOSIS — H35363 Drusen (degenerative) of macula, bilateral: Secondary | ICD-10-CM | POA: Diagnosis not present

## 2023-11-12 DIAGNOSIS — N2581 Secondary hyperparathyroidism of renal origin: Secondary | ICD-10-CM | POA: Diagnosis not present

## 2023-11-12 DIAGNOSIS — R809 Proteinuria, unspecified: Secondary | ICD-10-CM | POA: Diagnosis not present

## 2023-11-12 DIAGNOSIS — I5040 Unspecified combined systolic (congestive) and diastolic (congestive) heart failure: Secondary | ICD-10-CM | POA: Diagnosis not present

## 2023-11-12 DIAGNOSIS — N184 Chronic kidney disease, stage 4 (severe): Secondary | ICD-10-CM | POA: Diagnosis not present

## 2023-11-12 DIAGNOSIS — I4811 Longstanding persistent atrial fibrillation: Secondary | ICD-10-CM | POA: Diagnosis not present

## 2023-11-12 DIAGNOSIS — R0902 Hypoxemia: Secondary | ICD-10-CM | POA: Diagnosis not present

## 2023-11-12 DIAGNOSIS — E875 Hyperkalemia: Secondary | ICD-10-CM | POA: Diagnosis not present

## 2023-11-12 DIAGNOSIS — I130 Hypertensive heart and chronic kidney disease with heart failure and stage 1 through stage 4 chronic kidney disease, or unspecified chronic kidney disease: Secondary | ICD-10-CM | POA: Diagnosis not present

## 2023-11-12 NOTE — Addendum Note (Signed)
 Addended by: TAWNI DRILLING D on: 11/12/2023 01:03 PM   Modules accepted: Orders

## 2023-11-12 NOTE — Progress Notes (Signed)
 Remote pacemaker transmission.

## 2023-11-15 ENCOUNTER — Encounter: Payer: Self-pay | Admitting: Cardiovascular Disease

## 2023-11-19 ENCOUNTER — Encounter: Payer: Self-pay | Admitting: Cardiovascular Disease

## 2023-11-19 ENCOUNTER — Encounter: Payer: Self-pay | Admitting: Internal Medicine

## 2023-11-30 DIAGNOSIS — R739 Hyperglycemia, unspecified: Secondary | ICD-10-CM | POA: Diagnosis not present

## 2023-11-30 DIAGNOSIS — I130 Hypertensive heart and chronic kidney disease with heart failure and stage 1 through stage 4 chronic kidney disease, or unspecified chronic kidney disease: Secondary | ICD-10-CM | POA: Diagnosis not present

## 2023-11-30 DIAGNOSIS — R5383 Other fatigue: Secondary | ICD-10-CM | POA: Diagnosis not present

## 2023-11-30 DIAGNOSIS — E039 Hypothyroidism, unspecified: Secondary | ICD-10-CM | POA: Diagnosis not present

## 2023-11-30 DIAGNOSIS — H353211 Exudative age-related macular degeneration, right eye, with active choroidal neovascularization: Secondary | ICD-10-CM | POA: Diagnosis not present

## 2023-11-30 DIAGNOSIS — N184 Chronic kidney disease, stage 4 (severe): Secondary | ICD-10-CM | POA: Diagnosis not present

## 2023-11-30 DIAGNOSIS — I480 Paroxysmal atrial fibrillation: Secondary | ICD-10-CM | POA: Diagnosis not present

## 2023-11-30 DIAGNOSIS — I129 Hypertensive chronic kidney disease with stage 1 through stage 4 chronic kidney disease, or unspecified chronic kidney disease: Secondary | ICD-10-CM | POA: Diagnosis not present

## 2023-11-30 DIAGNOSIS — Z950 Presence of cardiac pacemaker: Secondary | ICD-10-CM | POA: Diagnosis not present

## 2023-11-30 DIAGNOSIS — R5382 Chronic fatigue, unspecified: Secondary | ICD-10-CM | POA: Diagnosis not present

## 2023-11-30 DIAGNOSIS — I25118 Atherosclerotic heart disease of native coronary artery with other forms of angina pectoris: Secondary | ICD-10-CM | POA: Diagnosis not present

## 2023-12-01 ENCOUNTER — Other Ambulatory Visit: Payer: Self-pay | Admitting: Cardiovascular Disease

## 2023-12-08 ENCOUNTER — Other Ambulatory Visit: Payer: Self-pay | Admitting: Cardiovascular Disease

## 2023-12-14 NOTE — Progress Notes (Unsigned)
 Electrophysiology Office Note:   Date:  12/16/2023  ID:  Kyle Avila, Kyle Avila 1945/05/22, MRN [id number]  Primary Cardiologist: [name], MD Electrophysiologist: Fonda Kitty, MD      History of Present Illness:   Kyle Avila is a 78 y.o. 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  Discussed the use of AI scribe software for clinical note transcription with the patient, who gave verbal consent to proceed.  History of Present Illness Kyle Avila is a 78 year old male with atrial fibrillation who presents for evaluation and management of his condition. He was referred by Dr. Fernande, who is retiring, for continued management of his cardiac condition.  He has a history of atrial fibrillation and is currently experiencing persistent atrial fibrillation since June 2025. He does not feel significantly different when in atrial fibrillation, stating he has no idea when he's in it.  But acknowledges that he does not feel very energetic. He walks about a mile a day but feels 'puny, rundown, tired,' and notes that he can only walk so far before needing to stop. He does not experience angina pain but has to 'talk to himself to keep going.' He recalls a trip to [place] in May 2025, where he walked but did not exert himself significantly.  He is on Eliquis  for stroke prevention due to atrial fibrillation. He has not experienced any major bleeding issues, although he notes minor bleeding and bruising, which he attributes to his blood thinner. He recalls a past incident where a podiatrist removed his toenail, resulting in significant bleeding.  In the past, he was treated with amiodarone  for atrial fibrillation, which led to lung issues and was subsequently discontinued. He has  undergone cardioversion multiple times in the past, which provided only short-term relief. He mentions a significant improvement after receiving a double lead pacemaker, which was placed following a hospitalization in [facility] in February 2023.  Review of systems complete and found to be negative unless listed in HPI.   EP Information / Studies Reviewed:    EKG is ordered today. Personal review as below.  EKG Interpretation Date/Time:  Tuesday December 15 2023 09:04:05 EDT Ventricular Rate:  65 PR Interval:    QRS Duration:  176 QT Interval:  476 QTC Calculation: 495 R Axis:   263  Text Interpretation: Atrial fibrillation with complete heart block and Ventricular-paced rhythm and intermittent PVCs When compared with ECG of 26-May-2023 11:12, AF has replaced A pacing Vent. rate has increased BY   3 BPM Confirmed by Kitty Fonda [phone]) on 12/16/2023 2:31:49 PM   Echo 12/2022:   1. Left ventricular ejection fraction, by estimation, is 45 to 50%. The  left ventricle has mildly decreased function. The left ventricle  demonstrates regional wall motion abnormalities (see scoring  diagram/findings for description). There is mild  asymmetric left ventricular hypertrophy. Left ventricular diastolic  parameters are indeterminate. There is moderate hypokinesis of the left  ventricular, basal-mid inferolateral wall. There is mild hypokinesis of  the left ventricular, basal anteroseptal  wall, inferoseptal wall, anterior wall and inferior wall.   2. Right ventricular systolic function is normal. The right ventricular  size is normal. There is normal pulmonary artery systolic pressure.   3. Left atrial size was mildly dilated.  4. The mitral valve is normal in structure. Mild mitral valve  regurgitation. No evidence of mitral stenosis.   5. The aortic valve is calcified. There is moderate calcification of the  aortic valve. There is moderate thickening of the aortic valve. Aortic  valve  regurgitation is not visualized. Mild aortic valve stenosis.   6. The inferior vena cava is dilated in size with >50% respiratory  variability, suggesting right atrial pressure of 8 mmHg.    Risk Assessment/Calculations:    CHA2DS2-VASc Score = 5   This indicates a 7.2% annual risk of stroke. [name] score is based upon: CHF History: 1 HTN History: 1 Diabetes History: 0 Stroke History: 0 Vascular Disease History: 1 Age Score: 2 Gender Score: 0          Physical Exam:   VS:  BP (!) 164/86   Pulse 65   Ht 5' 9 (1.753 m)   Wt 162 lb (73.5 kg)   SpO2 96%   BMI 23.92 kg/m    Wt Readings from Last 3 Encounters:  12/15/23 162 lb (73.5 kg)  05/26/23 170 lb 3.2 oz (77.2 kg)  01/26/23 165 lb 9.6 oz (75.1 kg)     GEN: Well nourished, well developed in no acute distress NECK: No JVD CARDIAC: Normal rate, regular rhythm. 4/6 SEM.  Well-healed left chest pacer pocket. RESPIRATORY:  Clear to auscultation without rales, wheezing or rhonchi  ABDOMEN: Soft, non-distended EXTREMITIES:  No edema; No deformity   ASSESSMENT AND PLAN:    #CHB s/p CRT-P:  - Device interrogation performed.  Appropriate device function and stable lead parameters.  He has been a persistent AF since June 2025.  Presenting rhythm AF/BiV paced.  Battery okay, estimated longevity 9 years.  No programming changes. - Continue remote monitoring.  #Persistent atrial fibrillation: Increasing burden on pacemaker.  Persistent since June.  He has baseline fatigue and dyspnea.  Unsure whether or not these correlate with his increasing burden of AF. #Secondary hypercoagulable state due to AF: No bleeding issues. - We will arrange for cardioversion.  We will have patient return a few weeks after to see if he notices significant improvement in symptoms after restoration of sinus rhythm.  We will be able to assess whether or not he was able to maintain sinus rhythm and for how long with his pacemaker.  If he notes  significant improvement in symptoms with restoration of sinus rhythm, then we will pursue a rhythm control strategy. - Continue Eliquis  5 mg twice daily. - Continue metoprolol  XL 100 mg daily.  #HFmrEF: Well compensated on exam today. #Moderate AS: - Will trial rhythm control strategy as above. - Continue GDMT regimen of hydralazine , Imdur , metoprolol .  Close follow-up with primary cardiologist Dr. Wonda.  #CAD s/p CABG and PCIs: Denies chest pain. - Continue Plavix  75 mg daily.  Not on aspirin  due to Eliquis .  Continue close follow-up with Dr. Wonda. Has also seen Dr. Gardenia.  #Hypertension - Above goal today.  Recommend checking blood pressures 1-2 times per week at home and recording the values.  Recommend bringing these recordings to the primary care physician.   Follow up with Dr. Kennyth in 4 weeks  Signed, Fonda Kennyth, MD

## 2023-12-14 NOTE — H&P (View-Only) (Signed)
 Electrophysiology Office Note:   Date:  12/16/2023  ID:  Kyle, Avila 10/01/1945, MRN [id number]  Primary Cardiologist: [name], MD Electrophysiologist: Fonda Kitty, MD      History of Present Illness:   Kyle Avila is a 78 y.o. 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  Discussed the use of AI scribe software for clinical note transcription with the patient, who gave verbal consent to proceed.  History of Present Illness Kyle Avila is a 78 year old male with atrial fibrillation who presents for evaluation and management of his condition. He was referred by Dr. Fernande, who is retiring, for continued management of his cardiac condition.  He has a history of atrial fibrillation and is currently experiencing persistent atrial fibrillation since June 2025. He does not feel significantly different when in atrial fibrillation, stating he has no idea when he's in it.  But acknowledges that he does not feel very energetic. He walks about a mile a day but feels 'puny, rundown, tired,' and notes that he can only walk so far before needing to stop. He does not experience angina pain but has to 'talk to himself to keep going.' He recalls a trip to [place] in May 2025, where he walked but did not exert himself significantly.  He is on Eliquis  for stroke prevention due to atrial fibrillation. He has not experienced any major bleeding issues, although he notes minor bleeding and bruising, which he attributes to his blood thinner. He recalls a past incident where a podiatrist removed his toenail, resulting in significant bleeding.  In the past, he was treated with amiodarone  for atrial fibrillation, which led to lung issues and was subsequently discontinued. He has  undergone cardioversion multiple times in the past, which provided only short-term relief. He mentions a significant improvement after receiving a double lead pacemaker, which was placed following a hospitalization in [facility] in February 2023.  Review of systems complete and found to be negative unless listed in HPI.   EP Information / Studies Reviewed:    EKG is ordered today. Personal review as below.  EKG Interpretation Date/Time:  Tuesday December 15 2023 09:04:05 EDT Ventricular Rate:  65 PR Interval:    QRS Duration:  176 QT Interval:  476 QTC Calculation: 495 R Axis:   263  Text Interpretation: Atrial fibrillation with complete heart block and Ventricular-paced rhythm and intermittent PVCs When compared with ECG of 26-May-2023 11:12, AF has replaced A pacing Vent. rate has increased BY   3 BPM Confirmed by Kitty Fonda [phone]) on 12/16/2023 2:31:49 PM   Echo 12/2022:   1. Left ventricular ejection fraction, by estimation, is 45 to 50%. The  left ventricle has mildly decreased function. The left ventricle  demonstrates regional wall motion abnormalities (see scoring  diagram/findings for description). There is mild  asymmetric left ventricular hypertrophy. Left ventricular diastolic  parameters are indeterminate. There is moderate hypokinesis of the left  ventricular, basal-mid inferolateral wall. There is mild hypokinesis of  the left ventricular, basal anteroseptal  wall, inferoseptal wall, anterior wall and inferior wall.   2. Right ventricular systolic function is normal. The right ventricular  size is normal. There is normal pulmonary artery systolic pressure.   3. Left atrial size was mildly dilated.  4. The mitral valve is normal in structure. Mild mitral valve  regurgitation. No evidence of mitral stenosis.   5. The aortic valve is calcified. There is moderate calcification of the  aortic valve. There is moderate thickening of the aortic valve. Aortic  valve  regurgitation is not visualized. Mild aortic valve stenosis.   6. The inferior vena cava is dilated in size with >50% respiratory  variability, suggesting right atrial pressure of 8 mmHg.    Risk Assessment/Calculations:    CHA2DS2-VASc Score = 5   This indicates a 7.2% annual risk of stroke. [name] score is based upon: CHF History: 1 HTN History: 1 Diabetes History: 0 Stroke History: 0 Vascular Disease History: 1 Age Score: 2 Gender Score: 0          Physical Exam:   VS:  BP (!) 164/86   Pulse 65   Ht 5' 9 (1.753 m)   Wt 162 lb (73.5 kg)   SpO2 96%   BMI 23.92 kg/m    Wt Readings from Last 3 Encounters:  12/15/23 162 lb (73.5 kg)  05/26/23 170 lb 3.2 oz (77.2 kg)  01/26/23 165 lb 9.6 oz (75.1 kg)     GEN: Well nourished, well developed in no acute distress NECK: No JVD CARDIAC: Normal rate, regular rhythm. 4/6 SEM.  Well-healed left chest pacer pocket. RESPIRATORY:  Clear to auscultation without rales, wheezing or rhonchi  ABDOMEN: Soft, non-distended EXTREMITIES:  No edema; No deformity   ASSESSMENT AND PLAN:    #CHB s/p CRT-P:  - Device interrogation performed.  Appropriate device function and stable lead parameters.  He has been a persistent AF since June 2025.  Presenting rhythm AF/BiV paced.  Battery okay, estimated longevity 9 years.  No programming changes. - Continue remote monitoring.  #Persistent atrial fibrillation: Increasing burden on pacemaker.  Persistent since June.  He has baseline fatigue and dyspnea.  Unsure whether or not these correlate with his increasing burden of AF. #Secondary hypercoagulable state due to AF: No bleeding issues. - We will arrange for cardioversion.  We will have patient return a few weeks after to see if he notices significant improvement in symptoms after restoration of sinus rhythm.  We will be able to assess whether or not he was able to maintain sinus rhythm and for how long with his pacemaker.  If he notes  significant improvement in symptoms with restoration of sinus rhythm, then we will pursue a rhythm control strategy. - Continue Eliquis  5 mg twice daily. - Continue metoprolol  XL 100 mg daily.  #HFmrEF: Well compensated on exam today. #Moderate AS: - Will trial rhythm control strategy as above. - Continue GDMT regimen of hydralazine , Imdur , metoprolol .  Close follow-up with primary cardiologist Dr. Wonda.  #CAD s/p CABG and PCIs: Denies chest pain. - Continue Plavix  75 mg daily.  Not on aspirin  due to Eliquis .  Continue close follow-up with Dr. Wonda. Has also seen Dr. Gardenia.  #Hypertension - Above goal today.  Recommend checking blood pressures 1-2 times per week at home and recording the values.  Recommend bringing these recordings to the primary care physician.   Follow up with Dr. Kennyth in 4 weeks  Signed, Fonda Kennyth, MD

## 2023-12-15 ENCOUNTER — Ambulatory Visit: Attending: Cardiology | Admitting: Cardiology

## 2023-12-15 ENCOUNTER — Other Ambulatory Visit: Payer: Self-pay

## 2023-12-15 ENCOUNTER — Encounter: Payer: Self-pay | Admitting: Cardiology

## 2023-12-15 VITALS — BP 164/86 | HR 65 | Ht 69.0 in | Wt 162.0 lb

## 2023-12-15 DIAGNOSIS — I10 Essential (primary) hypertension: Secondary | ICD-10-CM | POA: Diagnosis not present

## 2023-12-15 DIAGNOSIS — I350 Nonrheumatic aortic (valve) stenosis: Secondary | ICD-10-CM

## 2023-12-15 DIAGNOSIS — Z950 Presence of cardiac pacemaker: Secondary | ICD-10-CM

## 2023-12-15 DIAGNOSIS — D6869 Other thrombophilia: Secondary | ICD-10-CM | POA: Diagnosis not present

## 2023-12-15 DIAGNOSIS — I5022 Chronic systolic (congestive) heart failure: Secondary | ICD-10-CM | POA: Diagnosis not present

## 2023-12-15 DIAGNOSIS — I442 Atrioventricular block, complete: Secondary | ICD-10-CM | POA: Diagnosis not present

## 2023-12-15 DIAGNOSIS — Z951 Presence of aortocoronary bypass graft: Secondary | ICD-10-CM | POA: Diagnosis not present

## 2023-12-15 DIAGNOSIS — I4819 Other persistent atrial fibrillation: Secondary | ICD-10-CM | POA: Diagnosis not present

## 2023-12-15 NOTE — Patient Instructions (Signed)
 Medication Instructions:  Your physician recommends that you continue on your current medications as directed. Please refer to the Current Medication list given to you today.  *If you need a refill on your cardiac medications before your next appointment, please call your pharmacy*  Lab Work: TODAY: BMET and CBC  Testing/Procedures: Cardioversion  Your physician has recommended that you have a Cardioversion (DCCV). Electrical Cardioversion uses a jolt of electricity to your heart either through paddles or wired patches attached to your chest. This is a controlled, usually prescheduled, procedure. Defibrillation is done under light anesthesia in the hospital, and you usually go home the day of the procedure. This is done to get your heart back into a normal rhythm. You are not awake for the procedure. Please see the instruction sheet given to you today.  Follow-Up: At [facility], you and your health needs are our priority.  As part of our continuing mission to provide you with exceptional heart care, our providers are all part of one team.  This team includes your primary Cardiologist (physician) and Advanced Practice Providers or APPs (Physician Assistants and Nurse Practitioners) who all work together to provide you with the care you need, when you need it.  Your next appointment:   1 month  Provider:   Fonda Kitty, MD

## 2023-12-16 ENCOUNTER — Telehealth: Payer: Self-pay | Admitting: Cardiology

## 2023-12-16 ENCOUNTER — Encounter: Payer: Self-pay | Admitting: Cardiology

## 2023-12-16 ENCOUNTER — Telehealth: Payer: Self-pay

## 2023-12-16 MED ORDER — NITROGLYCERIN 0.4 MG SL SUBL: 0.4000 mg | SUBLINGUAL_TABLET | SUBLINGUAL | 1 refills | Status: AC | PRN

## 2023-12-16 NOTE — Telephone Encounter (Signed)
 Pt called in stating he wants to know the exact day that he went into afib. Pt will send in transmission and would like a call back

## 2023-12-16 NOTE — Telephone Encounter (Signed)
 RX sent in

## 2023-12-16 NOTE — Telephone Encounter (Signed)
 Patient called back and told he went into Afib on June 13th, 2025  Patient appreciative of call back

## 2023-12-16 NOTE — Telephone Encounter (Signed)
*  STAT* If patient is at the pharmacy, call can be transferred to refill team.   1. Which medications need to be refilled? (please list name of each medication and dose if known)   nitroGLYCERIN (NITROSTAT) 0.4 MG SL tablet    2. Which pharmacy/location (including street and city if local pharmacy) is medication to be sent to?  [facility] - [place], [place] - [address]. Do they need a 30 day or 90 day supply?  90 day  

## 2023-12-18 DIAGNOSIS — I442 Atrioventricular block, complete: Secondary | ICD-10-CM | POA: Diagnosis not present

## 2023-12-19 LAB — BASIC METABOLIC PANEL WITH GFR
BUN/Creatinine Ratio: 17 (ref 10–24)
BUN: 50 mg/dL — ABNORMAL HIGH (ref 8–27)
CO2: 22 mmol/L (ref 20–29)
Calcium: 9.2 mg/dL (ref 8.6–10.2)
Chloride: 103 mmol/L (ref 96–106)
Creatinine, Ser: 2.89 mg/dL — ABNORMAL HIGH (ref 0.76–1.27)
Glucose: 100 mg/dL — ABNORMAL HIGH (ref 70–99)
Potassium: 4.7 mmol/L (ref 3.5–5.2)
Sodium: 141 mmol/L (ref 134–144)
eGFR: 21 mL/min/1.73 — ABNORMAL LOW (ref >59)

## 2023-12-19 LAB — CBC
Hematocrit: 40.6 % (ref 37.5–51.0)
Hemoglobin: 13.2 g/dL (ref 13.0–17.7)
MCH: 36 pg — ABNORMAL HIGH (ref 26.6–33.0)
MCHC: 32.5 g/dL (ref 31.5–35.7)
MCV: 111 fL — ABNORMAL HIGH (ref 79–97)
Platelets: 150 x10E3/uL (ref 150–450)
RBC: 3.67 x10E6/uL — ABNORMAL LOW (ref 4.14–5.80)
RDW: 13.2 % (ref 11.6–15.4)
WBC: 3.6 x10E3/uL (ref 3.4–10.8)

## 2023-12-21 ENCOUNTER — Ambulatory Visit: Payer: Self-pay | Admitting: Cardiology

## 2023-12-21 NOTE — Progress Notes (Signed)
 Spoke to patient and instructed them to come at 0630  and to be NPO after 0000.     Confirmed that patient will have a ride home and someone to stay with them for 24 hours after the procedure.   Medications reviewed.  Confirmed blood thinner.  Confirmed no breaks in taking blood thinner for 3+ weeks prior to procedure. Confirmed patient stopped all GLP-1s and GLP-2s for at least one week before procedure.

## 2023-12-22 ENCOUNTER — Encounter (HOSPITAL_COMMUNITY)
Admission: RE | Disposition: A | Discharge status: Home / Self Care | Source: Home / Self Care | Attending: Internal Medicine

## 2023-12-22 ENCOUNTER — Other Ambulatory Visit: Payer: Self-pay

## 2023-12-22 ENCOUNTER — Ambulatory Visit (HOSPITAL_COMMUNITY): Admitting: Anesthesiology

## 2023-12-22 ENCOUNTER — Ambulatory Visit (INDEPENDENT_AMBULATORY_CARE_PROVIDER_SITE_OTHER): Payer: Medicare HMO

## 2023-12-22 ENCOUNTER — Encounter (HOSPITAL_COMMUNITY): Payer: Self-pay | Admitting: Internal Medicine

## 2023-12-22 ENCOUNTER — Ambulatory Visit (HOSPITAL_COMMUNITY)
Admission: RE | Admit: 2023-12-22 | Discharge: 2023-12-22 | Disposition: A | Discharge status: Home / Self Care | Attending: Internal Medicine | Admitting: Internal Medicine

## 2023-12-22 ENCOUNTER — Encounter: Payer: Self-pay | Admitting: Cardiovascular Disease

## 2023-12-22 ENCOUNTER — Ambulatory Visit: Attending: Cardiovascular Disease

## 2023-12-22 DIAGNOSIS — Z7901 Long term (current) use of anticoagulants: Secondary | ICD-10-CM | POA: Diagnosis not present

## 2023-12-22 DIAGNOSIS — E785 Hyperlipidemia, unspecified: Secondary | ICD-10-CM | POA: Insufficient documentation

## 2023-12-22 DIAGNOSIS — I252 Old myocardial infarction: Secondary | ICD-10-CM | POA: Diagnosis not present

## 2023-12-22 DIAGNOSIS — Z950 Presence of cardiac pacemaker: Secondary | ICD-10-CM | POA: Diagnosis not present

## 2023-12-22 DIAGNOSIS — Z955 Presence of coronary angioplasty implant and graft: Secondary | ICD-10-CM | POA: Insufficient documentation

## 2023-12-22 DIAGNOSIS — I4819 Other persistent atrial fibrillation: Secondary | ICD-10-CM

## 2023-12-22 DIAGNOSIS — Z7902 Long term (current) use of antithrombotics/antiplatelets: Secondary | ICD-10-CM | POA: Insufficient documentation

## 2023-12-22 DIAGNOSIS — I4891 Unspecified atrial fibrillation: Secondary | ICD-10-CM | POA: Diagnosis not present

## 2023-12-22 DIAGNOSIS — Z79899 Other long term (current) drug therapy: Secondary | ICD-10-CM | POA: Diagnosis not present

## 2023-12-22 DIAGNOSIS — D6869 Other thrombophilia: Secondary | ICD-10-CM | POA: Diagnosis not present

## 2023-12-22 DIAGNOSIS — I129 Hypertensive chronic kidney disease with stage 1 through stage 4 chronic kidney disease, or unspecified chronic kidney disease: Secondary | ICD-10-CM | POA: Diagnosis not present

## 2023-12-22 DIAGNOSIS — I2510 Atherosclerotic heart disease of native coronary artery without angina pectoris: Secondary | ICD-10-CM | POA: Insufficient documentation

## 2023-12-22 DIAGNOSIS — Z951 Presence of aortocoronary bypass graft: Secondary | ICD-10-CM | POA: Insufficient documentation

## 2023-12-22 DIAGNOSIS — I442 Atrioventricular block, complete: Secondary | ICD-10-CM

## 2023-12-22 DIAGNOSIS — I5022 Chronic systolic (congestive) heart failure: Secondary | ICD-10-CM | POA: Insufficient documentation

## 2023-12-22 DIAGNOSIS — I110 Hypertensive heart disease with heart failure: Secondary | ICD-10-CM | POA: Insufficient documentation

## 2023-12-22 DIAGNOSIS — I428 Other cardiomyopathies: Secondary | ICD-10-CM

## 2023-12-22 DIAGNOSIS — I6521 Occlusion and stenosis of right carotid artery: Secondary | ICD-10-CM | POA: Insufficient documentation

## 2023-12-22 DIAGNOSIS — N184 Chronic kidney disease, stage 4 (severe): Secondary | ICD-10-CM

## 2023-12-22 DIAGNOSIS — I25119 Atherosclerotic heart disease of native coronary artery with unspecified angina pectoris: Secondary | ICD-10-CM

## 2023-12-22 HISTORY — PX: CARDIOVERSION: EP1203

## 2023-12-22 SURGERY — CARDIOVERSION (CATH LAB)
Anesthesia: Monitor Anesthesia Care

## 2023-12-22 MED ORDER — SODIUM CHLORIDE 0.9% FLUSH: 3.0000 mL | INTRAVENOUS | Status: DC | PRN

## 2023-12-22 MED ORDER — SODIUM CHLORIDE 0.9 % IV SOLN
250.0000 mL | INTRAVENOUS | Status: DC | PRN
Start: 2023-12-22 — End: 2023-12-22

## 2023-12-22 MED ORDER — PROPOFOL 10 MG/ML IV BOLUS
INTRAVENOUS | Status: DC | PRN
  Administered 2023-12-22: 60 mg via INTRAVENOUS

## 2023-12-22 MED ORDER — LIDOCAINE 2% (20 MG/ML) 5 ML SYRINGE
INTRAMUSCULAR | Status: DC | PRN
  Administered 2023-12-22: 50 mg via INTRAVENOUS

## 2023-12-22 MED ORDER — SODIUM CHLORIDE 0.9% FLUSH: 3.0000 mL | Freq: Two times a day (BID) | INTRAVENOUS | Status: DC

## 2023-12-22 SURGICAL SUPPLY — 1 items: PAD DEFIB RADIO PHYSIO CONN (PAD) ×1 IMPLANT

## 2023-12-22 NOTE — Transfer of Care (Signed)
 Immediate Anesthesia Transfer of Care Note  Patient: ORAL REMACHE  Procedure(s) Performed: CARDIOVERSION  Patient Location: PACU  Anesthesia Type:General  Level of Consciousness: awake and drowsy  Airway & Oxygen  Therapy: Patient Spontanous Breathing and Patient connected to nasal cannula oxygen   Post-op Assessment: Report given to RN and Post -op Vital signs reviewed and stable  Post vital signs: Reviewed and stable  Last Vitals:  Vitals Value Taken Time  BP 114/68 12/22/23 07:52  Temp    Pulse 60 12/22/23 07:52  Resp 20 12/22/23 07:52  SpO2 94 % 12/22/23 07:52    Last Pain:  Vitals:   12/22/23 0701  TempSrc: Temporal  PainSc: 0-No pain         Complications: No notable events documented.

## 2023-12-22 NOTE — Telephone Encounter (Signed)
 Device Clinic apt made today 12/22/23 @ 3:20 pm to program AT/AF daily Burden Settings ON

## 2023-12-22 NOTE — CV Procedure (Signed)
    CARDIOVERSION NOTE  Procedure: Electrical Cardioversion Indications:  Atrial Fibrillation  Procedure Details:  Consent: Risks of procedure as well as the alternatives and risks of each were explained to the (patient/caregiver).  Consent for procedure obtained.  Time Out: Verified patient identification, verified procedure, site/side was marked, verified correct patient position, special equipment/implants available, medications/allergies/relevent history reviewed, required imaging and test results available.  Performed  BI-V ICD was interrogated wirelessly throughout the procedure.  Patient placed on cardiac monitor, pulse oximetry, supplemental oxygen  as necessary.  Sedation given: propofol  per anesthesia Pacer pads placed anterior and posterior chest.  Cardioverted 1 time(s).  Cardioverted at 200J biphasic.  Impression: Findings: Post procedure EKG shows: Bi-V paced rhythm Complications: None Patient did tolerate procedure well.  Plan: Successful DCCV with a single 200J Biphasic shock to BI-V paced rhythm as confirmed by device interrogation.  Time Spent Directly with the Patient:  25 minutes   Kyle KYM Maxcy, MD, [facility], FNLA, FACP  [place]  [facility]  Medical Director of the Advanced Lipid Disorders &  Cardiovascular Risk Reduction Clinic Diplomate of the American Board of Clinical Lipidology Attending Cardiologist  Direct Dial: [phone]  Fax: [phone]  Website:  [email or web address]  Kyle Avila 12/22/2023, 7:52 AM

## 2023-12-22 NOTE — Patient Instructions (Signed)
 Follow up as scheduled.

## 2023-12-22 NOTE — Interval H&P Note (Signed)
 History and Physical Interval Note:  12/22/2023 7:37 AM  Kyle Avila  has presented today for surgery, with the diagnosis of afib.  The various methods of treatment have been discussed with the patient and family. After consideration of risks, benefits and other options for treatment, the patient has consented to  Procedure(s): CARDIOVERSION (N/A) as a surgical intervention.  [name] history has been reviewed, patient examined, no change in status, stable for surgery.  I have reviewed the patient's chart and labs.  Questions were answered to the patient's satisfaction.     [name]

## 2023-12-22 NOTE — Anesthesia Postprocedure Evaluation (Signed)
 Anesthesia Post Note  Patient: Kyle Avila  Procedure(s) Performed: CARDIOVERSION     Patient location during evaluation: Cath Lab Anesthesia Type: MAC Level of consciousness: awake Pain management: pain level controlled Vital Signs Assessment: post-procedure vital signs reviewed and stable Respiratory status: spontaneous breathing, nonlabored ventilation and respiratory function stable Cardiovascular status: blood pressure returned to baseline and stable Postop Assessment: no apparent nausea or vomiting Anesthetic complications: no   No notable events documented.  Last Vitals:  Vitals:   12/22/23 0810 12/22/23 0820  BP: 127/72 133/76  Pulse: 60 60  Resp: 17 (!) 21  Temp:    SpO2: 90% 94%    Last Pain:  Vitals:   12/22/23 0701  TempSrc: Temporal  PainSc: 0-No pain                 Lucious Zou P Ronalee Scheunemann

## 2023-12-22 NOTE — Telephone Encounter (Signed)
 Pt called in he has just gotten an ablation and would like to know going forward does he need to send in a transmission periodically to know when he is out of rhythm because he cannot tell when he is in afib

## 2023-12-22 NOTE — Anesthesia Preprocedure Evaluation (Addendum)
 Anesthesia Evaluation  Patient identified by MRN, date of birth, ID band Patient awake    Reviewed: Allergy & Precautions, NPO status , Patient's Chart, lab work & pertinent test results  Airway Mallampati: II       Dental no notable dental hx.    Pulmonary neg pulmonary ROS   Pulmonary exam normal        Cardiovascular hypertension, Pt. on medications + angina  + CAD, + Past MI, + Cardiac Stents, + CABG and + Peripheral Vascular Disease  Normal cardiovascular exam+ dysrhythmias + pacemaker + Valvular Problems/Murmurs      Neuro/Psych negative neurological ROS  negative psych ROS   GI/Hepatic negative GI ROS, Neg liver ROS,,,  Endo/Other  negative endocrine ROS    Renal/GU CRFRenal disease     Musculoskeletal  (+) Arthritis ,    Abdominal   Peds  Hematology  (+) Blood dyscrasia (Eliquis )   Anesthesia Other Findings A-fib  Reproductive/Obstetrics                              Anesthesia Physical Anesthesia Plan  ASA: 3  Anesthesia Plan: MAC   Post-op Pain Management:    Induction:   PONV Risk Score and Plan: 1 and Propofol  infusion and Treatment may vary due to age or medical condition  Airway Management Planned: Simple Face Mask  Additional Equipment:   Intra-op Plan:   Post-operative Plan:   Informed Consent: I have reviewed the patients History and Physical, chart, labs and discussed the procedure including the risks, benefits and alternatives for the proposed anesthesia with the patient or authorized representative who has indicated his/her understanding and acceptance.     Dental advisory given  Plan Discussed with: CRNA  Anesthesia Plan Comments:         Anesthesia Quick Evaluation

## 2023-12-22 NOTE — Progress Notes (Signed)
 Full device interrogation not done. Presenting AS/BP 63. Programmed AT/AF burden alert on. See attachment for details.

## 2023-12-23 LAB — CUP PACEART REMOTE DEVICE CHECK
Battery Remaining Longevity: 110 mo
Battery Voltage: 3 V
Brady Statistic RA Percent Paced: 0.05 %
Brady Statistic RV Percent Paced: 91.15 %
Date Time Interrogation Session: 20250826062043
Implantable Lead Connection Status: 753985
Implantable Lead Connection Status: 753985
Implantable Lead Connection Status: 753985
Implantable Lead Implant Date: 20070831
Implantable Lead Implant Date: 20070831
Implantable Lead Implant Date: 20231227
Implantable Lead Location: 753858
Implantable Lead Location: 753859
Implantable Lead Location: 753860
Implantable Lead Model: 3830
Implantable Lead Model: 4469
Implantable Lead Model: 4470
Implantable Lead Serial Number: 485949
Implantable Lead Serial Number: 553665
Implantable Pulse Generator Implant Date: 20231227
Lead Channel Impedance Value: 228 Ohm
Lead Channel Impedance Value: 266 Ohm
Lead Channel Impedance Value: 285 Ohm
Lead Channel Impedance Value: 304 Ohm
Lead Channel Impedance Value: 342 Ohm
Lead Channel Impedance Value: 380 Ohm
Lead Channel Impedance Value: 380 Ohm
Lead Channel Impedance Value: 437 Ohm
Lead Channel Impedance Value: 494 Ohm
Lead Channel Pacing Threshold Amplitude: 0.625 V
Lead Channel Pacing Threshold Amplitude: 0.75 V
Lead Channel Pacing Threshold Amplitude: 0.875 V
Lead Channel Pacing Threshold Pulse Width: 0.4 ms
Lead Channel Pacing Threshold Pulse Width: 0.4 ms
Lead Channel Pacing Threshold Pulse Width: 0.4 ms
Lead Channel Sensing Intrinsic Amplitude: 0.75 mV
Lead Channel Sensing Intrinsic Amplitude: 0.75 mV
Lead Channel Sensing Intrinsic Amplitude: 19.625 mV
Lead Channel Sensing Intrinsic Amplitude: 19.625 mV
Lead Channel Setting Pacing Amplitude: 1.25 V
Lead Channel Setting Pacing Amplitude: 1.5 V
Lead Channel Setting Pacing Amplitude: 2 V
Lead Channel Setting Pacing Pulse Width: 0.4 ms
Lead Channel Setting Pacing Pulse Width: 0.4 ms
Lead Channel Setting Sensing Sensitivity: 2.8 mV
Zone Setting Status: 755011
Zone Setting Status: 755011

## 2024-01-01 ENCOUNTER — Ambulatory Visit (HOSPITAL_COMMUNITY)
Admission: RE | Admit: 2024-01-01 | Discharge: 2024-01-01 | Disposition: A | Discharge status: Home / Self Care | Source: Ambulatory Visit | Attending: Cardiology | Admitting: Cardiology

## 2024-01-01 ENCOUNTER — Encounter (HOSPITAL_COMMUNITY): Payer: Self-pay | Admitting: Cardiology

## 2024-01-01 VITALS — BP 140/64 | HR 62 | Ht 69.0 in | Wt 166.6 lb

## 2024-01-01 DIAGNOSIS — I442 Atrioventricular block, complete: Secondary | ICD-10-CM | POA: Insufficient documentation

## 2024-01-01 DIAGNOSIS — I4819 Other persistent atrial fibrillation: Secondary | ICD-10-CM | POA: Insufficient documentation

## 2024-01-01 DIAGNOSIS — Z79899 Other long term (current) drug therapy: Secondary | ICD-10-CM | POA: Insufficient documentation

## 2024-01-01 DIAGNOSIS — I779 Disorder of arteries and arterioles, unspecified: Secondary | ICD-10-CM | POA: Diagnosis not present

## 2024-01-01 DIAGNOSIS — Z950 Presence of cardiac pacemaker: Secondary | ICD-10-CM | POA: Diagnosis not present

## 2024-01-01 DIAGNOSIS — J703 Chronic drug-induced interstitial lung disorders: Secondary | ICD-10-CM | POA: Insufficient documentation

## 2024-01-01 DIAGNOSIS — I2510 Atherosclerotic heart disease of native coronary artery without angina pectoris: Secondary | ICD-10-CM | POA: Diagnosis not present

## 2024-01-01 DIAGNOSIS — N189 Chronic kidney disease, unspecified: Secondary | ICD-10-CM | POA: Diagnosis not present

## 2024-01-01 DIAGNOSIS — Z955 Presence of coronary angioplasty implant and graft: Secondary | ICD-10-CM | POA: Insufficient documentation

## 2024-01-01 DIAGNOSIS — T462X5A Adverse effect of other antidysrhythmic drugs, initial encounter: Secondary | ICD-10-CM | POA: Insufficient documentation

## 2024-01-01 DIAGNOSIS — Z951 Presence of aortocoronary bypass graft: Secondary | ICD-10-CM | POA: Diagnosis not present

## 2024-01-01 DIAGNOSIS — E785 Hyperlipidemia, unspecified: Secondary | ICD-10-CM | POA: Diagnosis not present

## 2024-01-01 DIAGNOSIS — I130 Hypertensive heart and chronic kidney disease with heart failure and stage 1 through stage 4 chronic kidney disease, or unspecified chronic kidney disease: Secondary | ICD-10-CM | POA: Diagnosis not present

## 2024-01-01 DIAGNOSIS — I5022 Chronic systolic (congestive) heart failure: Secondary | ICD-10-CM | POA: Diagnosis not present

## 2024-01-01 DIAGNOSIS — I255 Ischemic cardiomyopathy: Secondary | ICD-10-CM | POA: Diagnosis not present

## 2024-01-01 NOTE — Patient Instructions (Signed)
  Follow-Up in: as needed   At the [facility], you and your health needs are our priority. We have a designated team specialized in the treatment of Heart Failure. This Care Team includes your primary Heart Failure Specialized Cardiologist (physician), Advanced Practice Providers (APPs- Physician Assistants and Nurse Practitioners), and Pharmacist who all work together to provide you with the care you need, when you need it.   You may see any of the following providers on your designated Care Team at your next follow up:  Dr. Toribio Fuel Dr. Ezra Shuck Dr. Ria Commander Dr. Odis Brownie Greig Mosses, NP Caffie Shed, [place] [facility] [place], [place] Beckey Coe, NP [place] Lee, NP Tinnie Redman, PharmD   Please be sure to bring in all your medications bottles to every appointment.   Need to Contact Us :  If you have any questions or concerns before your next appointment please send us  a message through Effie or call our office at [phone].    TO LEAVE A MESSAGE FOR THE NURSE SELECT OPTION 2, PLEASE LEAVE A MESSAGE INCLUDING: YOUR NAME DATE OF BIRTH CALL BACK NUMBER REASON FOR CALL**this is important as we prioritize the call backs  YOU WILL RECEIVE A CALL BACK THE SAME DAY AS LONG AS YOU CALL BEFORE 4:00 PM

## 2024-01-01 NOTE — Progress Notes (Addendum)
 [facility] NOTE  Referring Physician: Janey Santos, MD  Primary Care: Avva, Ravisankar, MD Primary Cardiologist:  HPI: Kyle Avila is a 78 y.o. 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   [name] has been followed by Dr. Wonda and Dr. Fernande for several years now. He has undergone numerous interventions for CAD and vascular disease as noted above. Despite this he continues to have significantly worsening functional status. According to [name], he was skiing over a year ago but now does not believe he can partake in activities requiring significant physical exertion due to fatigue and dyspnea. 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   Previous appt:  Since her last appointment [name], has had upgrade to his dual-chamber pacemaker to a biventricular pacemaker due to chronic RV pacing and atrial fibrillation.  Due to severe unstable angina he also underwent left heart cath several days ago by Dr. Wonda that showed stable severe coronary artery disease.  He was started on Ranexa  after that.  Since starting Ranexa  roughly 5 days ago he has had significant improvement in chest pressure; walked quarter mile w/o significant difficulty. No longer having PND. Cardioverted for atrial fibrillation on 12/22/23  Interval hx:  - He was cardioverted on 12/22/23 - Reports compliance with all medications; he walks up to 1 mile day. He watches his sodium intake very closely. Reports improvement in edema/weight  since his prior visit.    Current Outpatient Medications  Medication Sig Dispense Refill   acetaminophen  (TYLENOL ) 500 MG tablet Take 1,000 mg by mouth every 6 (six) hours as needed for moderate pain.     Aflibercept  (EYLEA ) 2 MG/0.05ML SOLN by Intravitreal route.     allopurinol  (ZYLOPRIM ) 100 MG tablet Take 100 mg by mouth at bedtime.     amLODipine  (NORVASC ) 5 MG tablet Take 1 tablet (5 mg total) by mouth daily. 90 tablet 3   azelastine  (ASTELIN ) 0.1 % nasal spray Place 1 spray into both nostrils 2 (two) times daily as needed for rhinitis or allergies. Use in each nostril as directed     clopidogrel  (PLAVIX ) 75 MG tablet TAKE 1 TABLET AT BEDTIME 90 tablet 2   ELIQUIS  5 MG TABS tablet TAKE 1 TABLET TWICE DAILY 180 tablet 3   empagliflozin  (JARDIANCE ) 10 MG TABS tablet Take 1 tablet (10 mg total) by mouth daily before breakfast. 90 tablet 3   Evolocumab  (REPATHA  SURECLICK) 140 MG/ML SOAJ Inject 140 mg into the skin every 14 (fourteen) days. 6 mL 3   fexofenadine (ALLEGRA) 180 MG tablet Take 180 mg by mouth daily.     furosemide  (LASIX ) 80 MG tablet Take 1 tablet (80 mg total) by mouth daily. 90 tablet 2   hydrALAZINE  (APRESOLINE ) 25 MG tablet Take 1 tablet (25 mg total) by mouth 3 (three) times daily. (Patient taking differently: Take 25 mg by mouth 2 (two) times daily.) 270 tablet 3   isosorbide  mononitrate (IMDUR ) 120 MG 24 hr tablet TAKE 1 TABLET (120  MG TOTAL) BY MOUTH DAILY. 90 tablet 3   ketoconazole (NIZORAL) 2 % cream Apply 1 application. topically daily as needed (itching toes).     metoprolol  succinate (TOPROL -XL) 100 MG 24 hr tablet Take 1 tablet (100 mg total) by mouth daily. Take with or immediately following a meal. 90 tablet 1   nitroGLYCERIN  (NITROSTAT ) 0.4 MG SL tablet Place 1 tablet (0.4 mg total) under the tongue every 5 (five) minutes as needed for chest pain. 75 tablet 1   ranolazine  (RANEXA ) 500 MG 12 hr tablet TAKE 1 TABLET TWICE DAILY 180 tablet 1   tamsulosin  (FLOMAX )  0.4 MG CAPS Take 0.4 mg by mouth daily.     No current facility-administered medications for this encounter.      PHYSICAL EXAM: Vitals:   01/01/24 1541  BP: (!) 140/64  Pulse: 62  SpO2: 94%   GENERAL: NAD Lungs- normal work of breathing CARDIAC:  JVP: 7 cm          Normal rate with regular rhythm. no murmur.  Pulses 2+. No edema.  ABDOMEN: Soft, non-tender, non-distended.  EXTREMITIES: Warm and well perfused.  NEUROLOGIC: No obvious FND   DATA REVIEW  ZRH:jumpjo fibrillation with V paced rhythm  ECHO: 02/26/22: LVEF 45-50% with inferior/inferolateral hypokinesis, normal RV function.  10/01/21: LVEF 45%, RV function mildly reduced 08/19/19: LVEF 45-50%, RVF normal.   CATH: LHC: 08/15/21:  Severe triple vessel CAD s/p CABG with 2/3 patent bypass grafts Chronic occlusion of the ostial LAD. The mid and distal LAD fills from the patent LIMA graft. Stable moderate stenosis at the anastomosis of the LIMA gaft to the LAD Chronic occlusion of the intermediate branch that fills from left to left collaterals supplied by the LIMA to LAD.  Patent proximal and mid Circumflex stents. Known occlusion of SVG to OM (non injected) Chronic occlusion ostial RCA (non injected) Patent SVG to PDA. Severe stenosis in the stented segment in the proximal SVG and severe stenosis in the stented segment in the distal SVG.  Successful PTCA with scoring balloon angioplasty of both stented segments in the SVG to PDA Normal LVEDP   ASSESSMENT & PLAN:  Ischemic cardiomyopathy with mildly reduced LVEF Etiology of YQ:Pdryzfpr, inferolateral hypokinesis, multiple PCIs and CABG as noted above NYHA class / AHA Stage:III Volume status & Diuretics: euvolemic,lasix  80mg  daily.  Vasodilators:Amlodipine  10mg  daily.  Beta-Blocker:metoprolol  succinate 50mg  daily.  FMJ:ynoipwh due to CKD Cardiometabolic:continue Jardiance  10 mg; repeat BMP today.  Devices therapies & Valvulopathies: In late December/early January  2024 [name] had significant progression of unstable angina.  He had a left heart cath that was stable with severe CAD as noted above.  He was started on Ranexa  500 mg twice daily.  He notes complete resolution of angina since addition of Ranexa .  Has now started walking 1/4 mile daily.  In addition underwent upgrade from dual-chamber pacemaker to biventricular pacemaker due to 100% RV pacing.  He has also completed prednisone  for pulmonary fibrosis from amiodarone .  With these changes he has continued to feel improvement in exercise capacity.  Will continue to monitor for the time being. Interval hx: stable functional status today; interested in barostim v CCM. Will obtain repeat TTE today to re-assess LV function.  - No changes today.   2. CAD - multiple interventions has noted above. Likely etiology of a significant portion of his dyspnea.  - LHC recently with stable CAD; started on ranexa  500mg  BID two weeks ago with significant improvement in angina.  3. CKD  - sCr stable; repeat labs today.  - jardiance  10mg  daily.  4. Persistent atrial fibrillation - Followed closely by Dr. Fernande - Bi-V pacemaker - EKG today with AV dual paced rhythm.   5. Carotid artery disease - s/p CEA  6. Amiodarone  lung toxicity  - PFTs w/ moderately reduced DLCO but no other signs of restrictive/obstructive disease.  - completed a prednisone  taper  [facility] Mechanical Circulatory Support

## 2024-01-03 ENCOUNTER — Ambulatory Visit: Payer: Self-pay | Admitting: Cardiology

## 2024-01-08 ENCOUNTER — Encounter: Payer: Self-pay | Admitting: Cardiology

## 2024-01-08 ENCOUNTER — Ambulatory Visit: Attending: Cardiology | Admitting: Cardiology

## 2024-01-08 VITALS — BP 128/64 | HR 60 | Ht 69.0 in | Wt 163.0 lb

## 2024-01-08 DIAGNOSIS — D6869 Other thrombophilia: Secondary | ICD-10-CM | POA: Diagnosis not present

## 2024-01-08 DIAGNOSIS — I4819 Other persistent atrial fibrillation: Secondary | ICD-10-CM | POA: Diagnosis not present

## 2024-01-08 DIAGNOSIS — I10 Essential (primary) hypertension: Secondary | ICD-10-CM

## 2024-01-08 DIAGNOSIS — I472 Ventricular tachycardia, unspecified: Secondary | ICD-10-CM

## 2024-01-08 DIAGNOSIS — I5022 Chronic systolic (congestive) heart failure: Secondary | ICD-10-CM | POA: Diagnosis not present

## 2024-01-08 DIAGNOSIS — I442 Atrioventricular block, complete: Secondary | ICD-10-CM | POA: Diagnosis not present

## 2024-01-08 DIAGNOSIS — I428 Other cardiomyopathies: Secondary | ICD-10-CM

## 2024-01-08 DIAGNOSIS — Z951 Presence of aortocoronary bypass graft: Secondary | ICD-10-CM | POA: Diagnosis not present

## 2024-01-08 DIAGNOSIS — Z950 Presence of cardiac pacemaker: Secondary | ICD-10-CM

## 2024-01-08 DIAGNOSIS — I483 Typical atrial flutter: Secondary | ICD-10-CM

## 2024-01-08 DIAGNOSIS — I350 Nonrheumatic aortic (valve) stenosis: Secondary | ICD-10-CM

## 2024-01-08 NOTE — Progress Notes (Unsigned)
 Electrophysiology Office Note:   Date:  01/09/2024  ID:  Kyle Avila, DOB 02-04-1946, MRN [id number]  Primary Cardiologist: [name], MD Electrophysiologist: Fonda Kitty, MD      History of Present Illness:   Kyle Avila is a 78 y.o. 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  Discussed the use of AI scribe software for clinical note transcription with the patient, who gave verbal consent to proceed.  History of Present Illness Kyle Avila is a 78 year old male with atrial fibrillation who presents for follow-up after cardioversion on 12/22/23.  He has maintained an atrial paced rhythm since then. The patient reports that he does not feel significantly different after the cardioversion. He also states that he has not experienced angina pain since receiving a new pacemaker.  He has become more active and has not required nitroglycerin  for chest pain. He experiences fatigue and soreness, which he attributes to increased physical activity, including dance lessons. He was surprised to maintain a normal rhythm, stating he expected to 'flunk right away.'  His past medical history includes heart valve issues, reduced heart pumping function, and coronary artery disease with previous bypass surgery and stenting. He is currently on multiple medications, though he is unsure of the exact number, estimating 'fifteen, sixteen' medications.  He has a pacemaker that is set to alert his medical team if he experiences atrial fibrillation for more than six hours.  No new or acute complaints today.  Review of systems complete and found to be negative unless listed in HPI.   EP Information / Studies Reviewed:    EKG is ordered today. Personal review as below.  EKG  Interpretation Date/Time:  Friday January 08 2024 14:07:16 EDT Ventricular Rate:  60 PR Interval:  198 QRS Duration:  174 QT Interval:  496 QTC Calculation: 496 R Axis:   267  Text Interpretation: AV dual-paced rhythm When compared with ECG of 01-Jan-2024 15:54, No significant change was found Confirmed by Kitty Fonda [phone]) on 01/08/2024 2:09:06 PM   Echo 12/2022:   1. Left ventricular ejection fraction, by estimation, is 45 to 50%. The  left ventricle has mildly decreased function. The left ventricle  demonstrates regional wall motion abnormalities (see scoring  diagram/findings for description). There is mild  asymmetric left ventricular hypertrophy. Left ventricular diastolic  parameters are indeterminate. There is moderate hypokinesis of the left  ventricular, basal-mid inferolateral wall. There is mild hypokinesis of  the left ventricular, basal anteroseptal  wall, inferoseptal wall, anterior wall and inferior wall.   2. Right ventricular systolic function is normal. The right ventricular  size is normal. There is normal pulmonary artery systolic pressure.   3. Left atrial size was mildly dilated.   4. The mitral valve is normal in structure. Mild mitral valve  regurgitation. No evidence of mitral stenosis.   5. The aortic valve is calcified. There is moderate calcification of the  aortic valve. There is moderate thickening of the aortic valve. Aortic  valve regurgitation is not visualized. Mild aortic valve stenosis.   6. The inferior vena cava is dilated in size with >50% respiratory  variability, suggesting right atrial pressure of 8 mmHg.    Risk Assessment/Calculations:    CHA2DS2-VASc Score = 5   This indicates a  7.2% annual risk of stroke. [name] score is based upon: CHF History: 1 HTN History: 1 Diabetes History: 0 Stroke History: 0 Vascular Disease History: 1 Age Score: 2 Gender Score: 0          Physical Exam:   VS:  BP 128/64 (BP  Location: Right Arm, Patient Position: Sitting, Cuff Size: Normal)   Pulse 60   Ht 5' 9 (1.753 m)   Wt 163 lb (73.9 kg)   SpO2 96%   BMI 24.07 kg/m    Wt Readings from Last 3 Encounters:  01/08/24 163 lb (73.9 kg)  01/01/24 166 lb 9.6 oz (75.6 kg)  12/15/23 162 lb (73.5 kg)     GEN: Well nourished, well developed in no acute distress NECK: No JVD CARDIAC: Normal rate, regular rhythm. 4/6 SEM.  Well-healed left chest pacer pocket. RESPIRATORY:  Clear to auscultation without rales, wheezing or rhonchi  ABDOMEN: Soft, non-distended EXTREMITIES:  No edema; No deformity   ASSESSMENT AND PLAN:    #Persistent atrial fibrillation: Has been maintaining in atrial paced rhythm since cardioversion.  #Secondary hypercoagulable state due to AF: No bleeding issues. - We discussed long-term options for rhythm control.  Patient is not interested in antiarrhythmic drug therapy or catheter ablation at this time.  He would like to monitor for AF recurrence. - Continue Eliquis  5 mg twice daily. - Continue metoprolol  XL 100 mg daily. - Monitor AF burden with pacemaker.  #CHB s/p CRT-P:  - Device interrogation performed.  Appropriate device function and stable lead parameters.  No atrial fibrillation since cardioversion.  Presenting rhythm is AP-BiV paced. Battery okay, estimated longevity 9 years.  No programming changes. - Continue remote monitoring.  #HFmrEF: Well compensated on exam today. #Moderate AS: -If he has recurrence of A-fib then recommend long-term rhythm control strategy. - Continue GDMT regimen of hydralazine , Imdur , metoprolol .  Close follow-up with primary cardiologist Dr. Wonda.  #CAD s/p CABG and PCIs: Denies chest pain. - Continue Plavix  75 mg daily.  Not on aspirin  due to Eliquis .  Continue close follow-up with Dr. Wonda. Has also seen Dr. Gardenia.  #Hypertension - Above goal today.  Recommend checking blood pressures 1-2 times per week at home and recording the values.   Recommend bringing these recordings to the primary care physician.  Follow up with EP APP in 6 months  Signed, Fonda Kitty, MD

## 2024-01-08 NOTE — Patient Instructions (Signed)
 Medication Instructions:  Your physician recommends that you continue on your current medications as directed. Please refer to the Current Medication list given to you today.  *If you need a refill on your cardiac medications before your next appointment, please call your pharmacy*  Lab Work: None ordered.  If you have labs (blood work) drawn today and your tests are completely normal, you will receive your results only by: MyChart Message (if you have MyChart) OR A paper copy in the mail If you have any lab test that is abnormal or we need to change your treatment, we will call you to review the results.  Testing/Procedures: None ordered.   Follow-Up: At [facility], you and your health needs are our priority.  As part of our continuing mission to provide you with exceptional heart care, our providers are all part of one team.  This team includes your primary Cardiologist (physician) and Advanced Practice Providers or APPs (Physician Assistants and Nurse Practitioners) who all work together to provide you with the care you need, when you need it.  Your next appointment:   6 months with Dr Kennyth

## 2024-01-11 NOTE — Progress Notes (Signed)
 Remote PPM Transmission

## 2024-01-14 ENCOUNTER — Encounter: Payer: Self-pay | Admitting: Cardiology

## 2024-01-21 ENCOUNTER — Encounter: Payer: Self-pay | Admitting: Cardiology

## 2024-01-25 DIAGNOSIS — H353211 Exudative age-related macular degeneration, right eye, with active choroidal neovascularization: Secondary | ICD-10-CM | POA: Diagnosis not present

## 2024-01-28 ENCOUNTER — Telehealth: Payer: Self-pay

## 2024-01-28 NOTE — Telephone Encounter (Signed)
 Pt called in 01/27/2024 LVM wanting to know if he has been in afib. Transmission was sent

## 2024-01-28 NOTE — Telephone Encounter (Signed)
 Device Transmission Reviewed:  Normal device function No Afib since cardioversion on 12/22/23. No episodes.   Patient made aware.   Do want to flag for Dr. Kennyth that patient's BVP% declined <90% since time of cardioversion on 12/22/23. (Was normal prior).  Averaging 82-86% now.  Patient is asymptomatic. Some noted PAC's on presenting. PVC counters low burden.

## 2024-02-03 DIAGNOSIS — E039 Hypothyroidism, unspecified: Secondary | ICD-10-CM | POA: Diagnosis not present

## 2024-02-03 DIAGNOSIS — I130 Hypertensive heart and chronic kidney disease with heart failure and stage 1 through stage 4 chronic kidney disease, or unspecified chronic kidney disease: Secondary | ICD-10-CM | POA: Diagnosis not present

## 2024-02-03 DIAGNOSIS — E785 Hyperlipidemia, unspecified: Secondary | ICD-10-CM | POA: Diagnosis not present

## 2024-02-03 DIAGNOSIS — I5022 Chronic systolic (congestive) heart failure: Secondary | ICD-10-CM | POA: Diagnosis not present

## 2024-02-03 DIAGNOSIS — N184 Chronic kidney disease, stage 4 (severe): Secondary | ICD-10-CM | POA: Diagnosis not present

## 2024-02-03 DIAGNOSIS — M109 Gout, unspecified: Secondary | ICD-10-CM | POA: Diagnosis not present

## 2024-02-10 DIAGNOSIS — Z950 Presence of cardiac pacemaker: Secondary | ICD-10-CM | POA: Diagnosis not present

## 2024-02-10 DIAGNOSIS — Z1331 Encounter for screening for depression: Secondary | ICD-10-CM | POA: Diagnosis not present

## 2024-02-10 DIAGNOSIS — E039 Hypothyroidism, unspecified: Secondary | ICD-10-CM | POA: Diagnosis not present

## 2024-02-10 DIAGNOSIS — R82998 Other abnormal findings in urine: Secondary | ICD-10-CM | POA: Diagnosis not present

## 2024-02-10 DIAGNOSIS — H353210 Exudative age-related macular degeneration, right eye, stage unspecified: Secondary | ICD-10-CM | POA: Diagnosis not present

## 2024-02-10 DIAGNOSIS — Z0000 Encounter for general adult medical examination without abnormal findings: Secondary | ICD-10-CM | POA: Diagnosis not present

## 2024-02-10 DIAGNOSIS — I130 Hypertensive heart and chronic kidney disease with heart failure and stage 1 through stage 4 chronic kidney disease, or unspecified chronic kidney disease: Secondary | ICD-10-CM | POA: Diagnosis not present

## 2024-02-10 DIAGNOSIS — Z23 Encounter for immunization: Secondary | ICD-10-CM | POA: Diagnosis not present

## 2024-02-10 DIAGNOSIS — N184 Chronic kidney disease, stage 4 (severe): Secondary | ICD-10-CM | POA: Diagnosis not present

## 2024-02-10 DIAGNOSIS — Z1339 Encounter for screening examination for other mental health and behavioral disorders: Secondary | ICD-10-CM | POA: Diagnosis not present

## 2024-02-10 DIAGNOSIS — I480 Paroxysmal atrial fibrillation: Secondary | ICD-10-CM | POA: Diagnosis not present

## 2024-02-10 DIAGNOSIS — I6521 Occlusion and stenosis of right carotid artery: Secondary | ICD-10-CM | POA: Diagnosis not present

## 2024-02-10 DIAGNOSIS — I5022 Chronic systolic (congestive) heart failure: Secondary | ICD-10-CM | POA: Diagnosis not present

## 2024-02-10 DIAGNOSIS — E785 Hyperlipidemia, unspecified: Secondary | ICD-10-CM | POA: Diagnosis not present

## 2024-02-15 ENCOUNTER — Other Ambulatory Visit: Payer: Self-pay

## 2024-02-15 ENCOUNTER — Encounter: Payer: Self-pay | Admitting: Cardiovascular Disease

## 2024-02-15 MED ORDER — APIXABAN 5 MG PO TABS: 5.0000 mg | ORAL_TABLET | Freq: Two times a day (BID) | ORAL | 3 refills | Status: DC

## 2024-02-15 NOTE — Telephone Encounter (Signed)
 RX sent in earlier on 02/15/24. Called Pt to inform

## 2024-02-15 NOTE — Telephone Encounter (Signed)
 Prescription refill request for Eliquis  received. Indication:afib Last office visit:9/25 Scr:2.89  8/25 Age: 78 Weight:73.9  kg  Prescription refilled

## 2024-03-01 DIAGNOSIS — N184 Chronic kidney disease, stage 4 (severe): Secondary | ICD-10-CM | POA: Diagnosis not present

## 2024-03-04 DIAGNOSIS — I130 Hypertensive heart and chronic kidney disease with heart failure and stage 1 through stage 4 chronic kidney disease, or unspecified chronic kidney disease: Secondary | ICD-10-CM | POA: Diagnosis not present

## 2024-03-04 DIAGNOSIS — R809 Proteinuria, unspecified: Secondary | ICD-10-CM | POA: Diagnosis not present

## 2024-03-04 DIAGNOSIS — I4811 Longstanding persistent atrial fibrillation: Secondary | ICD-10-CM | POA: Diagnosis not present

## 2024-03-04 DIAGNOSIS — E875 Hyperkalemia: Secondary | ICD-10-CM | POA: Diagnosis not present

## 2024-03-04 DIAGNOSIS — R0902 Hypoxemia: Secondary | ICD-10-CM | POA: Diagnosis not present

## 2024-03-04 DIAGNOSIS — I5040 Unspecified combined systolic (congestive) and diastolic (congestive) heart failure: Secondary | ICD-10-CM | POA: Diagnosis not present

## 2024-03-04 DIAGNOSIS — N184 Chronic kidney disease, stage 4 (severe): Secondary | ICD-10-CM | POA: Diagnosis not present

## 2024-03-04 DIAGNOSIS — N2581 Secondary hyperparathyroidism of renal origin: Secondary | ICD-10-CM | POA: Diagnosis not present

## 2024-03-09 ENCOUNTER — Encounter: Payer: Self-pay | Admitting: Cardiovascular Disease

## 2024-03-09 ENCOUNTER — Ambulatory Visit: Attending: Student in an Organized Health Care Education/Training Program | Admitting: Cardiovascular Disease

## 2024-03-09 VITALS — BP 118/60 | HR 64 | Ht 68.0 in | Wt 168.4 lb

## 2024-03-09 DIAGNOSIS — I4819 Other persistent atrial fibrillation: Secondary | ICD-10-CM | POA: Diagnosis not present

## 2024-03-09 DIAGNOSIS — I350 Nonrheumatic aortic (valve) stenosis: Secondary | ICD-10-CM

## 2024-03-09 DIAGNOSIS — I5022 Chronic systolic (congestive) heart failure: Secondary | ICD-10-CM | POA: Diagnosis not present

## 2024-03-09 DIAGNOSIS — I25708 Atherosclerosis of coronary artery bypass graft(s), unspecified, with other forms of angina pectoris: Secondary | ICD-10-CM

## 2024-03-09 DIAGNOSIS — I10 Essential (primary) hypertension: Secondary | ICD-10-CM

## 2024-03-09 DIAGNOSIS — E782 Mixed hyperlipidemia: Secondary | ICD-10-CM | POA: Diagnosis not present

## 2024-03-09 NOTE — Assessment & Plan Note (Signed)
 Treated with evolocumab .

## 2024-03-09 NOTE — Assessment & Plan Note (Signed)
 Blood pressure is well-controlled on multidrug regimen.

## 2024-03-09 NOTE — Assessment & Plan Note (Signed)
 Patient is paced.  He continues on apixaban  for anticoagulation.  Next year he will meet criteria for dose reduction when he turns 78 years old.  Continue current management.

## 2024-03-09 NOTE — Progress Notes (Signed)
 Cardiology Office Note:    Date:  03/09/2024   ID:  Kyle Avila, DOB August 20, 1945, MRN [id number]  PCP:  Janey Santos, MD   [facility] Providers Cardiologist:  [name], MD Cardiology APP:  Lelon Glendia DASEN, PA-C  Electrophysiologist:  Fonda Kitty, MD     Referring MD: Janey Santos, MD   Chief Complaint  Patient presents with   Follow-up    CHF    History of Present Illness:    Kyle Avila is a 78 y.o. male with a hx of:  Coronary artery disease S/p CABG in 1988; redo in 1996 S/p multiple PCI procedures (DES x 2 to LCx in 2009, DES to S-PDA in 2016, DES x 2 to S-PDA in 2018, DES to RPDA and cutting balloon POBA to S-RPDA (ISR) 6/21; POBA to both stented segments of S-PDA in 07/2021) LHC 05/21/2022: LM 100, LAD ostial 100, mid 65 (at anastomosis), distal 40 (after anastomosis); RI ostial 100; LM to LCx stent patent, OM1 100; RCA ostial 100 CTO, RPDA stent 95 ISR; SVG-RPDA stent patent with 30 ISR, 40 ISR; SVG-OM1 100 CTO; LIMA-LAD patent>> med Rx (+ Ranexa ) HFmrEF (heart failure with mildly reduced ejection fraction)  Ischemic CM TTE 02/26/2022: EF 45-50, mildly reduced RVSF, RVSP 33.9, mild to moderate MR, AV sclerosis, RAP 3   Persistent atrial fibrillation  S/p DCCV 06/2021 >> ERAF Amiodarone  DC'd (Lung toxicity; failed DCCV) Completed course of Prednisone   Dr. Annella ([place]) Mild to mod mitral regurgitation  Carotid artery disease S/p R CEA Carotid US  04/18/2022: Patent R CEA; L ICA 1-39; R VA stenosis CHB, s/p pacemaker S/p Upgrade to CRT 03/2022 Chronic kidney disease Hypertension Hyperlipidemia  The patient is here alone today.  He is actually doing quite well.  States that he has been more active and engaged in regular exercise.  He has been doing some dancing.  He has not had any anginal symptoms since his last visit.  He complains of generalized fatigue.  The patient has stage IV chronic kidney disease and is followed carefully by  nephrology.  He is going to transition to Dr. Jerrye in the near future with Dr. Alida relocation.  He has done some educational sessions on dialysis and we discussed some of the differences between peritoneal and hemodialysis today.  The patient reports no changes in his cardiac medications.  He has no lightheadedness, heart palpitations, or syncope.  He plans to go skiing at [facility] this winter.  Current Medications: Current Meds  Medication Sig   acetaminophen  (TYLENOL ) 500 MG tablet Take 1,000 mg by mouth every 6 (six) hours as needed for moderate pain.   Aflibercept  (EYLEA ) 2 MG/0.05ML SOLN by Intravitreal route.   allopurinol  (ZYLOPRIM ) 100 MG tablet Take 100 mg by mouth at bedtime.   amLODipine  (NORVASC ) 5 MG tablet Take 1 tablet (5 mg total) by mouth daily.   apixaban  (ELIQUIS ) 5 MG TABS tablet Take 1 tablet (5 mg total) by mouth 2 (two) times daily.   azelastine  (ASTELIN ) 0.1 % nasal spray Place 1 spray into both nostrils 2 (two) times daily as needed for rhinitis or allergies. Use in each nostril as directed   clopidogrel  (PLAVIX ) 75 MG tablet TAKE 1 TABLET AT BEDTIME   empagliflozin  (JARDIANCE ) 10 MG TABS tablet Take 1 tablet (10 mg total) by mouth daily before breakfast.   Evolocumab  (REPATHA  SURECLICK) 140 MG/ML SOAJ Inject 140 mg into the skin every 14 (fourteen) days.   fexofenadine (ALLEGRA) 180 MG  tablet Take 180 mg by mouth daily.   furosemide  (LASIX ) 80 MG tablet Take 1 tablet (80 mg total) by mouth daily.   hydrALAZINE  (APRESOLINE ) 25 MG tablet Take 1 tablet (25 mg total) by mouth 3 (three) times daily. (Patient taking differently: Take 25 mg by mouth 2 (two) times daily.)   isosorbide  mononitrate (IMDUR ) 120 MG 24 hr tablet TAKE 1 TABLET (120 MG TOTAL) BY MOUTH DAILY.   ketoconazole (NIZORAL) 2 % cream Apply 1 application. topically daily as needed (itching toes).   metoprolol  succinate (TOPROL -XL) 100 MG 24 hr tablet Take 1 tablet (100 mg total) by mouth  daily. Take with or immediately following a meal.   nitroGLYCERIN  (NITROSTAT ) 0.4 MG SL tablet Place 1 tablet (0.4 mg total) under the tongue every 5 (five) minutes as needed for chest pain.   ranolazine  (RANEXA ) 500 MG 12 hr tablet TAKE 1 TABLET TWICE DAILY   tamsulosin  (FLOMAX ) 0.4 MG CAPS Take 0.4 mg by mouth daily.     Allergies:   Amiodarone    ROS:   Please see the history of present illness.    All other systems reviewed and are negative.  EKGs/Labs/Other Studies Reviewed:    The following studies were reviewed today: Cardiac Studies & Procedures   ______________________________________________________________________________________________ CARDIAC CATHETERIZATION  CARDIAC CATHETERIZATION 05/21/2022  Conclusion 1.  Known total occlusion of the RCA 2.  Continued patency of the stented segment from the left main into the native circumflex 3.  Known total occlusion of the proximal LAD at the ostium 4.  Continued patency of the LIMA to LAD with moderate stenosis just beyond the insertion site of the mammary artery, unchanged from prior cardiac cath studies over many years 5.  Continued patency of the saphenous vein graft to PDA with only mild in-stent restenosis in the mid and distal bodies of the graft, both lesions treated last year with scoring balloon angioplasty  Recommendations: No targets for PCI.  Continue aggressive medical and antianginal therapies.  Will add Ranexa  to his drug regimen as he is already on amlodipine , metoprolol  succinate, and high-dose isosorbide .  Findings Coronary Findings Diagnostic  Dominance: Right  Left Main LM lesion is 100% stenosed. The lesion is calcified.  Left Anterior Descending Ost LAD lesion is 100% stenosed. The lesion is calcified. Mid LAD lesion is 65% stenosed. lesion at LIMA anastomosis. slight progression from previous cath study. Mid LAD to Dist LAD lesion is 40% stenosed. There is a 40% lesion in the LAD just beyond the graft  insertion site. This does not appear to be flow-limiting.  Ramus Intermedius Collaterals Ramus filled by collaterals from 2nd Sept.  Ost Ramus to Ramus lesion is 100% stenosed.  Left Circumflex Ost Cx to Prox Cx lesion is 10% stenosed. The lesion was previously treated using a drug eluting stent over 2 years ago.  First Obtuse Marginal Branch 1st Mrg lesion is 100% stenosed.  Right Coronary Artery The RCA is known to be chronically occluded. The vessel is not selectively injected. Ost RCA lesion is 100% stenosed.  Right Posterior Descending Artery Non-stenotic RPDA lesion was previously treated. There is 95% stenosis at the junction of the graft insertion site and PDA branch new from the previous study  Single Graft Graft To RPDA And is large. Prox Graft lesion is 30% stenosed. The lesion was previously treated using a drug eluting stent over 2 years ago. Previously placed stent displays restenosis. Dist Graft lesion is 40% stenosed. The lesion was previously treated using a drug  eluting stent . Previously placed stent displays restenosis.  Single Graft Graft To 1st Mrg Known to be occluded Prox Graft lesion is 100% stenosed.  LIMA LIMA Graft To Mid LAD LIMA and is normal in caliber.  The graft exhibits no disease.  Intervention  No interventions have been documented.   CARDIAC CATHETERIZATION  CARDIAC CATHETERIZATION 08/15/2021  Conclusion   LM lesion is 100% stenosed.   Mid LAD to Dist LAD lesion is 40% stenosed.   Mid LAD lesion is 75% stenosed.   Ost LAD lesion is 100% stenosed.   Ost Ramus to Ramus lesion is 100% stenosed.   Ost RCA lesion is 100% stenosed.   Prox Graft lesion is 100% stenosed.   1st Mrg lesion is 100% stenosed.   Dist Graft lesion is 80% stenosed.   Prox Graft lesion is 99% stenosed.   Ost Cx to Prox Cx lesion is 10% stenosed.   Non-stenotic RPDA lesion was previously treated.   Scoring balloon angioplasty was performed using a BALLN  WOLVERINE 4.00X10.   Scoring balloon angioplasty was performed using a BALLN WOLVERINE 4.00X10.   Post intervention, there is a 10% residual stenosis.   Post intervention, there is a 30% residual stenosis.   LIMA and is normal in caliber.   and is large.   The graft exhibits no disease.  Severe triple vessel CAD s/p CABG with 2/3 patent bypass grafts Chronic occlusion of the ostial LAD. The mid and distal LAD fills from the patent LIMA graft. Stable moderate stenosis at the anastomosis of the LIMA gaft to the LAD Chronic occlusion of the intermediate branch that fills from left to left collaterals supplied by the LIMA to LAD. Patent proximal and mid Circumflex stents. Known occlusion of SVG to OM (non injected) Chronic occlusion ostial RCA (non injected) Patent SVG to PDA. Severe stenosis in the stented segment in the proximal SVG and severe stenosis in the stented segment in the distal SVG. Successful PTCA with scoring balloon angioplasty of both stented segments in the SVG to PDA Normal LVEDP  Recommendations: Continue Plavix . I will have him take ASA in the am but this can be stopped once his Eliquis  is resumed.  Findings Coronary Findings Diagnostic  Dominance: Right  Left Main LM lesion is 100% stenosed. The lesion is calcified.  Left Anterior Descending Ost LAD lesion is 100% stenosed. The lesion is calcified. Mid LAD lesion is 75% stenosed. lesion at LIMA anastomosis. slight progression from previous cath study. Mid LAD to Dist LAD lesion is 40% stenosed. There is a 40% lesion in the LAD just beyond the graft insertion site. This does not appear to be flow-limiting.  Ramus Intermedius Collaterals Ramus filled by collaterals from 2nd Sept.  Ost Ramus to Ramus lesion is 100% stenosed.  Left Circumflex Ost Cx to Prox Cx lesion is 10% stenosed. The lesion was previously treated using a drug eluting stent over 2 years ago.  First Obtuse Marginal Branch 1st Mrg lesion is  100% stenosed.  Right Coronary Artery The RCA is known to be chronically occluded. The vessel is not selectively injected. Ost RCA lesion is 100% stenosed.  Right Posterior Descending Artery Non-stenotic RPDA lesion was previously treated. There is 95% stenosis at the junction of the graft insertion site and PDA branch new from the previous study  Single Graft Graft To RPDA And is large. Prox Graft lesion is 99% stenosed. The lesion was previously treated using a drug eluting stent over 2 years ago. Previously placed  stent displays restenosis. Dist Graft lesion is 80% stenosed. The lesion was previously treated using a drug eluting stent . Previously placed stent displays restenosis.  Single Graft Graft To 1st Mrg Known to be occluded Prox Graft lesion is 100% stenosed.  LIMA LIMA Graft To Mid LAD LIMA and is normal in caliber.  The graft exhibits no disease.  Intervention  Prox Graft lesion (Single Graft Graft To RPDA) Angioplasty CATHETER LAUNCHER 6FR MP1 guide catheter was inserted. WIRE COUGAR XT STRL 190CM guidewire used to cross lesion. Scoring balloon angioplasty was performed using a BALLN WOLVERINE 4.00X10. Post-Intervention Lesion Assessment The intervention was successful. Pre-interventional TIMI flow is 3. Post-intervention TIMI flow is 3. No complications occurred at this lesion. There is a 10% residual stenosis post intervention.  Dist Graft lesion (Single Graft Graft To RPDA) Angioplasty CATHETER LAUNCHER 6FR MP1 guide catheter was inserted. WIRE COUGAR XT STRL 190CM guidewire used to cross lesion. Scoring balloon angioplasty was performed using a BALLN WOLVERINE 4.00X10. Post-Intervention Lesion Assessment The intervention was successful. Pre-interventional TIMI flow is 3. Post-intervention TIMI flow is 3. No complications occurred at this lesion. There is a 30% residual stenosis post intervention.     ECHOCARDIOGRAM  ECHOCARDIOGRAM COMPLETE  01/06/2023  Narrative ECHOCARDIOGRAM REPORT    Patient Name:   Kyle Avila Date of Exam: 01/06/2023 Medical Rec #:  [id number]       Height:       69.0 in Accession #:    [id number]      Weight:       163.6 lb Date of Birth:  [birth year]       BSA:          1.897 m Patient Age:    56 years        BP:           146/65 mmHg Patient Gender: M               HR:           61 bpm. Exam Location:  Outpatient  Procedure: 2D Echo, 3D Echo, Cardiac Doppler, Color Doppler and Strain Analysis  Indications:    I50.21 CHF  History:        Patient has prior history of Echocardiogram examinations, most recent 02/26/2022. HFmrEF, Ischemic cardiomyopathy, CAD, Pacemaker and Prior CABG, Carotid Disease, Arrythmias:CHB; Risk Factors:Hypertension and Dyslipidemia. Previous echo revealed LVEF 50% mild to moderate MR and PAP 33 mmHg.  Sonographer:    Nolon Berg BA, RDCS Referring Phys: JANEY SANTOS  IMPRESSIONS   1. Left ventricular ejection fraction, by estimation, is 45 to 50%. The left ventricle has mildly decreased function. The left ventricle demonstrates regional wall motion abnormalities (see scoring diagram/findings for description). There is mild asymmetric left ventricular hypertrophy. Left ventricular diastolic parameters are indeterminate. There is moderate hypokinesis of the left ventricular, basal-mid inferolateral wall. There is mild hypokinesis of the left ventricular, basal anteroseptal wall, inferoseptal wall, anterior wall and inferior wall. 2. Right ventricular systolic function is normal. The right ventricular size is normal. There is normal pulmonary artery systolic pressure. 3. Left atrial size was mildly dilated. 4. The mitral valve is normal in structure. Mild mitral valve regurgitation. No evidence of mitral stenosis. 5. The aortic valve is calcified. There is moderate calcification of the aortic valve. There is moderate thickening of the aortic valve. Aortic valve  regurgitation is not visualized. Mild aortic valve stenosis. 6. The inferior vena cava is dilated in size with >50% respiratory variability,  suggesting right atrial pressure of 8 mmHg.  Comparison(s): No significant change from prior study.  Conclusion(s)/Recommendation(s): Strain shows cherry on top pattern, cannot exclude amyloid as etiology. Consider PYP scan if clinically appropriate.  FINDINGS Left Ventricle: Left ventricular ejection fraction, by estimation, is 45 to 50%. The left ventricle has mildly decreased function. The left ventricle demonstrates regional wall motion abnormalities. Moderate hypokinesis of the left ventricular, basal-mid inferolateral wall. Mild hypokinesis of the left ventricular, basal anteroseptal wall, inferoseptal wall, anterior wall and inferior wall. The left ventricular internal cavity size was normal in size. There is mild asymmetric left ventricular hypertrophy. Abnormal (paradoxical) septal motion, consistent with RV pacemaker. Left ventricular diastolic parameters are indeterminate.  Right Ventricle: The right ventricular size is normal. No increase in right ventricular wall thickness. Right ventricular systolic function is normal. There is normal pulmonary artery systolic pressure. The tricuspid regurgitant velocity is 2.50 m/s, and with an assumed right atrial pressure of 8 mmHg, the estimated right ventricular systolic pressure is 33.0 mmHg.  Left Atrium: Left atrial size was mildly dilated.  Right Atrium: Right atrial size was normal in size.  Pericardium: There is no evidence of pericardial effusion.  Mitral Valve: The mitral valve is normal in structure. Mild mitral valve regurgitation. No evidence of mitral valve stenosis.  Tricuspid Valve: The tricuspid valve is normal in structure. Tricuspid valve regurgitation is mild . No evidence of tricuspid stenosis.  Aortic Valve: The aortic valve is calcified. There is moderate calcification of the  aortic valve. There is moderate thickening of the aortic valve. Aortic valve regurgitation is not visualized. Mild aortic stenosis is present. Aortic valve mean gradient measures 16.0 mmHg. Aortic valve peak gradient measures 30.7 mmHg. Aortic valve area, by VTI measures 1.25 cm.  Pulmonic Valve: The pulmonic valve was grossly normal. Pulmonic valve regurgitation is mild. No evidence of pulmonic stenosis.  Aorta: The aortic root, ascending aorta and aortic arch are all structurally normal, with no evidence of dilitation or obstruction.  Venous: The inferior vena cava is dilated in size with greater than 50% respiratory variability, suggesting right atrial pressure of 8 mmHg.  IAS/Shunts: The atrial septum is grossly normal.  Additional Comments: A device lead is visualized.   LEFT VENTRICLE PLAX 2D LVIDd:         4.90 cm   Diastology LVIDs:         4.30 cm   LV e' medial:    4.35 cm/s LV PW:         1.40 cm   LV E/e' medial:  13.8 LV IVS:        1.10 cm   LV e' lateral:   8.70 cm/s LVOT diam:     2.10 cm   LV E/e' lateral: 6.9 LV SV:         73 LV SV Index:   39        2D Longitudinal Strain LVOT Area:     3.46 cm  2D Strain GLS (A2C):   -10.6 % 2D Strain GLS (A3C):   -11.2 % 2D Strain GLS (A4C):   -11.1 % 2D Strain GLS Avg:     -11.0 %  3D Volume EF: 3D EF:        45 % LV EDV:       138 ml LV ESV:       76 ml LV SV:        63 ml  RIGHT VENTRICLE  IVC RV Basal diam:  3.90 cm    IVC diam: 2.10 cm RV Mid diam:    3.20 cm RV S prime:     8.92 cm/s TAPSE (M-mode): 1.9 cm RVSP:           28.0 mmHg  LEFT ATRIUM             Index        RIGHT ATRIUM           Index LA diam:        4.30 cm 2.27 cm/m   RA Pressure: 3.00 mmHg LA Vol (A2C):   59.8 ml 31.52 ml/m  RA Area:     18.20 cm LA Vol (A4C):   48.5 ml 25.57 ml/m  RA Volume:   46.00 ml  24.25 ml/m LA Biplane Vol: 55.7 ml 29.36 ml/m AORTIC VALVE AV Area (Vmax):    1.23 cm AV Area (Vmean):   1.33 cm AV  Area (VTI):     1.25 cm AV Vmax:           277.00 cm/s AV Vmean:          172.500 cm/s AV VTI:            0.586 m AV Peak Grad:      30.7 mmHg AV Mean Grad:      16.0 mmHg LVOT Vmax:         98.50 cm/s LVOT Vmean:        66.000 cm/s LVOT VTI:          0.211 m LVOT/AV VTI ratio: 0.36  AORTA Ao Root diam: 3.70 cm Ao Asc diam:  3.20 cm  MITRAL VALVE               TRICUSPID VALVE MV Area (PHT): 3.19 cm    TR Peak grad:   25.0 mmHg MV Decel Time: 238 msec    TR Vmax:        250.00 cm/s MV E velocity: 60.10 cm/s  Estimated RAP:  3.00 mmHg MV A velocity: 35.50 cm/s  RVSP:           28.0 mmHg MV E/A ratio:  1.69 SHUNTS Systemic VTI:  0.21 m Systemic Diam: 2.10 cm  Shelda Bruckner MD Electronically signed by Shelda Bruckner MD Signature Date/Time: 01/06/2023/2:19:02 PM    Final          ______________________________________________________________________________________________      EKG:   EKG Interpretation Date/Time:  Wednesday March 09 2024 09:21:44 EST Ventricular Rate:  64 PR Interval:    QRS Duration:  172 QT Interval:  506 QTC Calculation: 522 R Axis:   256  Text Interpretation: AV dual-paced rhythm with frequent ventricular-paced complexes When compared with ECG of 08-Jan-2024 14:07, Frequent PVC's are now present Confirmed by [name] [phone]) on 03/09/2024 9:33:10 AM    Recent Labs: 12/18/2023: BUN 50; Creatinine, Ser 2.89; Hemoglobin 13.2; Platelets 150; Potassium 4.7; Sodium 141  Recent Lipid Panel    Component Value Date/Time   CHOL 96 (L) 04/07/2022 0915   TRIG 145 04/07/2022 0915   HDL 57 04/07/2022 0915   CHOLHDL 1.7 04/07/2022 0915   CHOLHDL 2.4 10/19/2019 0306   VLDL 55 (H) 10/19/2019 0306   LDLCALC 15 04/07/2022 0915   LDLDIRECT 47 10/28/2019 0820     Risk Assessment/Calculations:    CHA2DS2-VASc Score = 5   This indicates a 7.2% annual risk of stroke. [name] score is based upon: CHF History: 1  HTN  History: 1 Diabetes History: 0 Stroke History: 0 Vascular Disease History: 1 Age Score: 2 Gender Score: 0               Physical Exam:    VS:  BP 118/60 (BP Location: Right Arm, Patient Position: Sitting, Cuff Size: Normal)   Pulse 64   Ht 5' 8 (1.727 m)   Wt 168 lb 6.4 oz (76.4 kg)   SpO2 93%   BMI 25.61 kg/m     Wt Readings from Last 3 Encounters:  03/09/24 168 lb 6.4 oz (76.4 kg)  01/08/24 163 lb (73.9 kg)  01/01/24 166 lb 9.6 oz (75.6 kg)     GEN:  Well nourished, well developed in no acute distress HEENT: Normal NECK: No JVD; No carotid bruits LYMPHATICS: No lymphadenopathy CARDIAC: RRR, 2/6 harsh crescendo decrescendo murmur at the right upper sternal border RESPIRATORY:  Clear to auscultation without rales, wheezing or rhonchi  ABDOMEN: Soft, non-tender, non-distended MUSCULOSKELETAL: 1+ left ankle and trace right ankle edema; No deformity  SKIN: Warm and dry, chronic stasis changes NEUROLOGIC:  Alert and oriented x 3 PSYCHIATRIC:  Normal affect   Assessment & Plan Persistent atrial fibrillation [facility]) Patient is paced.  He continues on apixaban  for anticoagulation.  Next year he will meet criteria for dose reduction when he turns 78 years old.  Continue current management. Chronic heart failure with mildly reduced ejection fraction (HFmrEF, 41-49%) (HCC) LVEF 45% on most recent echo one year ago.  Recommend repeat echo in the setting of aortic stenosis.  Continue GDMT as tolerated with his renal disease.  Not able to tolerate ACE/ARB/ARNI/Spiro. Coronary artery disease of bypass graft of native heart with stable angina pectoris Continue clopidogrel  and antianginal therapy with isosorbide , metoprolol  succinate, amlodipine , and ranolazine . Moderate aortic stenosis Echo 2024 showed LVEF 45-50%, mild-moderate AS with mean gradient 16 mmHg, calculated AVA 1.25 square cm.  Repeat echocardiogram this year. Primary hypertension Blood pressure is well-controlled on  multidrug regimen. Mixed hyperlipidemia Treated with evolocumab .      Medication Adjustments/Labs and Tests Ordered: Current medicines are reviewed at length with the patient today.  Concerns regarding medicines are outlined above.  Orders Placed This Encounter  Procedures   EKG 12-Lead   ECHOCARDIOGRAM COMPLETE   No orders of the defined types were placed in this encounter.   Patient Instructions  Medication Instructions:  No medication changes were made at this visit. Continue current regimen.   *If you need a refill on your cardiac medications before your next appointment, please call your pharmacy*  Lab Work: None ordered today. If you have labs (blood work) drawn today and your tests are completely normal, you will receive your results only by: MyChart Message (if you have MyChart) OR A paper copy in the mail If you have any lab test that is abnormal or we need to change your treatment, we will call you to review the results.  Testing/Procedures: Your physician has requested that you have an echocardiogram. Echocardiography is a painless test that uses sound waves to create images of your heart. It provides your doctor with information about the size and shape of your heart and how well your heart's chambers and valves are working. This procedure takes approximately one hour. There are no restrictions for this procedure. Please do NOT wear cologne, perfume, aftershave, or lotions (deodorant is allowed). Please arrive 15 minutes prior to your appointment time.  Please note: We ask at that you not bring children with you  during ultrasound (echo/ vascular) testing. Due to room size and safety concerns, children are not allowed in the ultrasound rooms during exams. Our front office staff cannot provide observation of children in our lobby area while testing is being conducted. An adult accompanying a patient to their appointment will only be allowed in the ultrasound room at the  discretion of the ultrasound technician under special circumstances. We apologize for any inconvenience.   Follow-Up: At [facility], you and your health needs are our priority.  As part of our continuing mission to provide you with exceptional heart care, our providers are all part of one team.  This team includes your primary Cardiologist (physician) and Advanced Practice Providers or APPs (Physician Assistants and Nurse Practitioners) who all work together to provide you with the care you need, when you need it.  Your next appointment:   6 month(s)  Provider:   [name], MD      Signed, [name], MD  03/09/2024 12:02 PM    [facility]

## 2024-03-09 NOTE — Assessment & Plan Note (Signed)
 Continue clopidogrel  and antianginal therapy with isosorbide , metoprolol  succinate, amlodipine , and ranolazine .

## 2024-03-09 NOTE — Patient Instructions (Signed)
 Medication Instructions:  No medication changes were made at this visit. Continue current regimen.   *If you need a refill on your cardiac medications before your next appointment, please call your pharmacy*  Lab Work: None ordered today. If you have labs (blood work) drawn today and your tests are completely normal, you will receive your results only by: MyChart Message (if you have MyChart) OR A paper copy in the mail If you have any lab test that is abnormal or we need to change your treatment, we will call you to review the results.  Testing/Procedures: Your physician has requested that you have an echocardiogram. Echocardiography is a painless test that uses sound waves to create images of your heart. It provides your doctor with information about the size and shape of your heart and how well your heart's chambers and valves are working. This procedure takes approximately one hour. There are no restrictions for this procedure. Please do NOT wear cologne, perfume, aftershave, or lotions (deodorant is allowed). Please arrive 15 minutes prior to your appointment time.  Please note: We ask at that you not bring children with you during ultrasound (echo/ vascular) testing. Due to room size and safety concerns, children are not allowed in the ultrasound rooms during exams. Our front office staff cannot provide observation of children in our lobby area while testing is being conducted. An adult accompanying a patient to their appointment will only be allowed in the ultrasound room at the discretion of the ultrasound technician under special circumstances. We apologize for any inconvenience.   Follow-Up: At [facility], you and your health needs are our priority.  As part of our continuing mission to provide you with exceptional heart care, our providers are all part of one team.  This team includes your primary Cardiologist (physician) and Advanced Practice Providers or APPs (Physician  Assistants and Nurse Practitioners) who all work together to provide you with the care you need, when you need it.  Your next appointment:   6 month(s)  Provider:   [name], MD

## 2024-03-10 ENCOUNTER — Encounter: Payer: Self-pay | Admitting: Cardiovascular Disease

## 2024-03-15 ENCOUNTER — Encounter: Payer: Self-pay | Admitting: Cardiology

## 2024-03-15 DIAGNOSIS — D692 Other nonthrombocytopenic purpura: Secondary | ICD-10-CM | POA: Diagnosis not present

## 2024-03-15 DIAGNOSIS — D225 Melanocytic nevi of trunk: Secondary | ICD-10-CM | POA: Diagnosis not present

## 2024-03-15 DIAGNOSIS — L814 Other melanin hyperpigmentation: Secondary | ICD-10-CM | POA: Diagnosis not present

## 2024-03-15 DIAGNOSIS — L570 Actinic keratosis: Secondary | ICD-10-CM | POA: Diagnosis not present

## 2024-03-15 DIAGNOSIS — L218 Other seborrheic dermatitis: Secondary | ICD-10-CM | POA: Diagnosis not present

## 2024-03-15 DIAGNOSIS — L821 Other seborrheic keratosis: Secondary | ICD-10-CM | POA: Diagnosis not present

## 2024-03-15 DIAGNOSIS — Z85828 Personal history of other malignant neoplasm of skin: Secondary | ICD-10-CM | POA: Diagnosis not present

## 2024-03-20 ENCOUNTER — Encounter: Payer: Self-pay | Admitting: Cardiovascular Disease

## 2024-03-21 NOTE — Telephone Encounter (Signed)
 Please review

## 2024-03-22 ENCOUNTER — Ambulatory Visit: Payer: Medicare HMO

## 2024-03-22 DIAGNOSIS — I4819 Other persistent atrial fibrillation: Secondary | ICD-10-CM | POA: Diagnosis not present

## 2024-03-23 LAB — CUP PACEART REMOTE DEVICE CHECK
Battery Remaining Longevity: 106 mo
Battery Voltage: 3 V
Brady Statistic AP VP Percent: 90.56 %
Brady Statistic AP VS Percent: 2.94 %
Brady Statistic AS VP Percent: 2.24 %
Brady Statistic AS VS Percent: 4.27 %
Brady Statistic RA Percent Paced: 96.8 %
Brady Statistic RV Percent Paced: 92.79 %
Date Time Interrogation Session: 20251125063015
Implantable Lead Connection Status: 753985
Implantable Lead Connection Status: 753985
Implantable Lead Connection Status: 753985
Implantable Lead Implant Date: 20070831
Implantable Lead Implant Date: 20070831
Implantable Lead Implant Date: 20231227
Implantable Lead Location: 753858
Implantable Lead Location: 753859
Implantable Lead Location: 753860
Implantable Lead Model: 3830
Implantable Lead Model: 4469
Implantable Lead Model: 4470
Implantable Lead Serial Number: 485949
Implantable Lead Serial Number: 553665
Implantable Pulse Generator Implant Date: 20231227
Lead Channel Impedance Value: 247 Ohm
Lead Channel Impedance Value: 266 Ohm
Lead Channel Impedance Value: 285 Ohm
Lead Channel Impedance Value: 304 Ohm
Lead Channel Impedance Value: 342 Ohm
Lead Channel Impedance Value: 361 Ohm
Lead Channel Impedance Value: 380 Ohm
Lead Channel Impedance Value: 437 Ohm
Lead Channel Impedance Value: 494 Ohm
Lead Channel Pacing Threshold Amplitude: 0.5 V
Lead Channel Pacing Threshold Amplitude: 0.75 V
Lead Channel Pacing Threshold Amplitude: 0.75 V
Lead Channel Pacing Threshold Pulse Width: 0.4 ms
Lead Channel Pacing Threshold Pulse Width: 0.4 ms
Lead Channel Pacing Threshold Pulse Width: 0.4 ms
Lead Channel Sensing Intrinsic Amplitude: 1.25 mV
Lead Channel Sensing Intrinsic Amplitude: 1.25 mV
Lead Channel Sensing Intrinsic Amplitude: 17.25 mV
Lead Channel Sensing Intrinsic Amplitude: 17.25 mV
Lead Channel Setting Pacing Amplitude: 1.25 V
Lead Channel Setting Pacing Amplitude: 1.5 V
Lead Channel Setting Pacing Amplitude: 2 V
Lead Channel Setting Pacing Pulse Width: 0.4 ms
Lead Channel Setting Pacing Pulse Width: 0.4 ms
Lead Channel Setting Sensing Sensitivity: 2.8 mV
Zone Setting Status: 755011
Zone Setting Status: 755011

## 2024-03-28 ENCOUNTER — Ambulatory Visit: Payer: Self-pay | Admitting: Cardiology

## 2024-03-28 DIAGNOSIS — H353211 Exudative age-related macular degeneration, right eye, with active choroidal neovascularization: Secondary | ICD-10-CM | POA: Diagnosis not present

## 2024-03-28 NOTE — Progress Notes (Signed)
 Remote PPM Transmission

## 2024-04-02 ENCOUNTER — Encounter: Payer: Self-pay | Admitting: Cardiovascular Disease

## 2024-04-02 ENCOUNTER — Encounter: Payer: Self-pay | Admitting: Cardiology

## 2024-04-03 ENCOUNTER — Encounter: Payer: Self-pay | Admitting: Cardiology

## 2024-04-06 ENCOUNTER — Other Ambulatory Visit: Payer: Self-pay | Admitting: Cardiovascular Disease

## 2024-04-07 ENCOUNTER — Encounter: Payer: Self-pay | Admitting: Cardiology

## 2024-04-08 ENCOUNTER — Encounter: Payer: Self-pay | Admitting: Cardiology

## 2024-04-11 ENCOUNTER — Encounter: Payer: Self-pay | Admitting: Cardiology

## 2024-04-11 ENCOUNTER — Telehealth: Payer: Self-pay | Admitting: Cardiology

## 2024-04-11 NOTE — Telephone Encounter (Signed)
 Transmission Received:  Steady Noted decline in effective BVP:  87% (this appears to be trend since September.  No episodes or arrhythmias Device function normal.   abnormal HF logistic concerns.  Optivol is in normal range now following recent increase; however, is trending upward again. Activity declined and not much variability in heart rates.  He is taking all of his medications.    He has ECHO on 04/13/24.    Will forward to Dr. Wonda and Dr. Kennyth.    Will have Dr. Wonda address acute HF exacerbation concerns.   May need follow up with EP for Device optimization consideration, patient also questioning his rate response (says he can't get his heart rate up, but also isn't able to be very active right now).

## 2024-04-11 NOTE — Telephone Encounter (Signed)
 Spoke with patient.  He is in his car now, but will go in and send a transmission in next 5-73minutes.  Will watch to see if it comes through otherwise will f/u in the morning.    Patient states he has noted increased SOB and coughing during and since recent trip to [place] . Cough is not new or acute (says he has had this for 15 years).  Had to use 02 the whole time during trip as 02 Sats were in the 70s.   He did sound visibly winded over the phone.  Will watch for device transmission and go from there.    Note: patient follows Dr. Wonda for primary HF management and is to have an ECHO this week on 04/13/24.

## 2024-04-11 NOTE — Telephone Encounter (Signed)
 Pt has left message and requested to speak with a nurse.  Could this message please reach [name]; this past week I spent the week hooked to a oxygen  concentrator. I was. In [place] and some part of my didnt like the altitude. I need to have you check for fluid in my lungs. Im  ready to send a Pacemaker transmission when. You are ready  Thanks & have a fine week. Kyle Avila

## 2024-04-11 NOTE — Telephone Encounter (Signed)
 Please see separate phone encounter regarding this message.

## 2024-04-12 NOTE — Telephone Encounter (Signed)
 Reviewed. Increase furosemide  to 80 mg BID x 3 days, then return to baseline dose of 80 mg daily. Thank you

## 2024-04-12 NOTE — Telephone Encounter (Signed)
 Spoke with pt and told him Dr. Margurite recommendations of increasing Lasix  to 80 mg BID for 3 days and then resuming regular 80 mg daily dosing on the 4th day. Pt verbalized understanding of recommendations/ plan and had no further questions at this time.

## 2024-04-13 ENCOUNTER — Encounter: Payer: Self-pay | Admitting: Cardiovascular Disease

## 2024-04-15 ENCOUNTER — Ambulatory Visit (HOSPITAL_COMMUNITY)
Admission: RE | Admit: 2024-04-15 | Discharge: 2024-04-15 | Disposition: A | Discharge status: Home / Self Care | Source: Ambulatory Visit | Attending: Cardiovascular Disease | Admitting: Cardiovascular Disease

## 2024-04-15 ENCOUNTER — Encounter: Payer: Self-pay | Admitting: Cardiovascular Disease

## 2024-04-15 ENCOUNTER — Ambulatory Visit: Payer: Self-pay | Admitting: Cardiovascular Disease

## 2024-04-15 DIAGNOSIS — I350 Nonrheumatic aortic (valve) stenosis: Secondary | ICD-10-CM

## 2024-04-15 LAB — ECHOCARDIOGRAM COMPLETE
AR max vel: 0.92 cm2
AV Area VTI: 0.88 cm2
AV Area mean vel: 0.96 cm2
AV Mean grad: 17 mmHg
AV Peak grad: 26.6 mmHg
Ao pk vel: 2.58 m/s
P 1/2 time: 712 ms
S' Lateral: 4.2 cm

## 2024-04-18 DIAGNOSIS — I350 Nonrheumatic aortic (valve) stenosis: Secondary | ICD-10-CM | POA: Insufficient documentation

## 2024-04-18 NOTE — Progress Notes (Unsigned)
 {This patient may be at risk for Amyloid. He has one or more dx on the prob list or PMH from the following list -  Abnormal EKG, HFpEF/Diastolic CHF, Aortic Stenosis, LVH, Bilateral Carpal Tunnel Syndrome, Biceps Tendon Rupture, Spinal Stenosis, Pericardial Effusion, Left Atrial Enlargement, Conduction System Disorder. See list below or review PMH.  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     Incomplete heart block 2:1 05/20/2011     Persistent atrial fibrillation (HCC) 06/24/2021     PTVDP '07 with gen change May 2016 05/20/2010    Click HERE to open Cardiac Amyloid Screening SmartSet to order screening OR Click HERE to defer testing for 1 year or permanently :1}     OFFICE NOTE:    Date:  04/18/2024  ID:  Kyle Avila, DOB April 06, 1946, MRN [id number] PCP: Janey Santos, MD  [facility] Providers Cardiologist:  [name], MD Cardiology APP:  Lelon Glendia DASEN, PA-C  Electrophysiologist:  Fonda Kitty, MD { Click to update primary MD,subspecialty MD or APP then REFRESH:1}      *** Patient Profile:   Coronary artery disease S/p CABG in 1988; redo in 1996 S/p multiple PCI procedures (DES x 2 to LCx in 2009, DES to S-PDA in 2016, DES x 2 to S-PDA in 2018, DES to RPDA and cutting balloon POBA to S-RPDA (ISR) 6/21; POBA to both stented segments of S-PDA in 07/2021) LHC 05/21/2022: LM 100, LAD ostial 100, mid 65 (at anastomosis), distal 40 (after anastomosis); RI ostial 100; LM to LCx stent patent, OM1 100; RCA ostial 100 CTO, RPDA stent 95 ISR; SVG-RPDA stent patent with 30 ISR, 40 ISR; SVG-OM1 100 CTO; LIMA-LAD patent>> med Rx (+ Ranexa ) HFmrEF (heart failure with mildly reduced ejection fraction)  Ischemic CM TTE 02/26/2022: EF 45-50, mildly reduced RVSF, RVSP 33.9, mild to moderate MR, AV sclerosis, RAP 3   TTE 01/06/23: EF 45-50, mild ASH, inf-lat HK, ant-sept, inf-sept, ant, inf HK, NL RVSF, NL PASP, mild LAE, mild AS (mean  16) TTE 04/15/24: EF 40-45, mild LVH, mod reduced RVSF, mod elevated PASP, RVSP 58.3, mild LAE, mod RAE, mild MR, trivial AI, mod LFLG AS (mean 17, Vmax 258 cm/s, DI 0.28) Aortic stenosis  Persistent atrial fibrillation  S/p DCCV 06/2021 >> ERAF Amiodarone  DC'd (Lung toxicity; failed DCCV) Completed course of Prednisone   Dr. Annella (P[facility]mo) S/p DCCV 11/2023 Mild to mod mitral regurgitation  Carotid artery disease S/p R CEA Carotid US  04/18/2022: Patent R CEA; L ICA 1-39; R VA stenosis CHB, s/p pacemaker S/p Upgrade to CRT 03/2022 Chronic kidney disease stage IV Hypertension Hyperlipidemia          Discussed the use of AI scribe software for clinical note transcription with the patient, who gave verbal consent to proceed. History of Present Illness Kyle Avila is a 78 y.o. male for evaluation of shortness of breath. He was last seen by Dr. Fell in 02/2024 and was doing quite well. He had previously undergone DCCV in 11/2023 for persistent atrial fibrillation. Recent device interrogation demonstrated elevated fluid levels and his diuretic was briefly adjusted with improved symptoms. He traveled to [place]  earlier in December and experienced issues with hypoxia. There are several MyChart messages which have been reviewed. He was apparently seen out there and started on O2. His device was interrogated again after he returned and his impedance numbers suggested accumulating fluid again.  His diuretics were adjusted again. He had a follow up echocardiogram done last week. Dr. Wonda personally reviewed this and noted progression of aortic stenosis, but still in mod range. His AV leaflets appear moderately to possibly severely restricted. Given his recent symptoms of CHF, it is reasonable to pursue R and L cardiac catheterization to further evaluate.     ROS-See HPI***    Studies Reviewed:       LABS 12/18/23: K 4.7, SCr 2.89, eGFR 21, Hgb 13.2, PLT 150K Results  Risk  Assessment/Calculations: {Does this patient have ATRIAL FIBRILLATION?:[id number]} No BP recorded.  {Refresh Note OR Click here to enter BP  :1}***      Physical Exam:  VS:  There were no vitals taken for this visit.       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 He has had more difficulty with volume excess recently requiring adjustments in diuretics. This is in the setting of advanced chronic kidney disease and progressive aortic stenosis. *** Persistent atrial fibrillation (HCC) S/p recent DCCV in 11/2023. He has a hx of Amio lung toxicity. *** Coronary artery disease of bypass graft of native heart with stable angina pectoris History of bypass in 1988 and redo in 1996 and multiple PCI procedures.  His last cardiac catheterization in Jan 2024 demonstrated patent left main to LCx stent, patent SVG-RPDA stent, patent LIMA-LAD, known occlusion of the RCA, LAD. There were no targets for PCI and med Rx was continued.  *** Moderate aortic stenosis Progressive aortic stenosis noted on recent echocardiogram. It appears to be in the mod range, but this is difficult to know with reduced LVF. He also has mod to possibly severe leaflet restriction. There is concern his aortic stenosis is contributing to his current symptoms. R and L cardiac catheterization has been considered. 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  NYHA II.  Volume status stable.  He feels much better on the lower dose of metoprolol  succinate.  However, he does not quite feel back to baseline.  He does admit that  his activity level has been lower.  We discussed increasing his activity level over time to see if this improves his symptoms.  Continue Jardiance  10 mg daily, hydralazine  25 mg 3 times a day, Imdur  120 mg daily, metoprolol  succinate 100 mg daily.  Keep follow-up with Dr. Wonda in August.   CAD (coronary artery disease) History of bypass in 1988 and redo in 1996 and multiple PCI procedures.  Most recent cardiac catheterization in Jan 2024 demonstrated patent left main to LCx stent, patent SVG-RPDA stent, patent LIMA-LAD, known occlusion of the RCA, LAD. There were no targets for PCI and med Rx was continued.  He is doing well without significant symptoms to suggest angina.  Continue amlodipine  5 mg daily, Plavix  75 mg daily, Imdur  120 mg daily, metoprolol  succinate 100 mg daily, nitroglycerin  as needed chest pain, ranolazine  500 mg twice daily.  Keep follow-up in August as planned.   Hypertension His blood pressure  here is optimal.  He has several readings at home in the 130s to 160s.  I have recommended that he monitor his blood pressure by checking it several hours after his medications, after resting for 20 minutes.  Continue amlodipine  5 mg daily, hydralazine  25 mg 3 times a day, Imdur  120 mg daily, metoprolol  succinate 100 mg daily.   Moderate mitral regurgitation Moderate by echo in November 2023.  Consider repeat echo again in November 2024.   Complete heart block s/p PPM Status post an upgrade to CRT.  Follow-up with EP as planned.   Hyperlipidemia LDL excellent in December 2023 at 15.  Continue Repatha  140 mg every 14 days.   Persistent atrial fibrillation (HCC) No atrial fibrillation detected on recent device interrogation.  Continue metoprolol  succinate 100 mg daily.  His weight is >60 kg and his age is <80.  Continue Eliquis  5 mg twice daily.    :1}    {Are you ordering a CV Procedure (e.g. stress test, cath, DCCV, TEE, etc)?   Press F2        :[id number]}  Dispo:  No follow-ups on  file.  Signed, Glendia Ferrier, PA-C

## 2024-04-18 NOTE — Assessment & Plan Note (Signed)
 Progressive aortic stenosis noted on recent echocardiogram. It appears to be in the mod range, but this is difficult to know with reduced LVF. He also has mod to possibly severe leaflet restriction. There is concern his aortic stenosis is contributing to his current symptoms. R and L cardiac catheterization has been recommended. I discussed this with the patient today. We reviewed the potential for worsening renal function with the administration of IV contrast. We discussed the rationale for TAVR if his AS is severe enough to require intervention. He agrees to proceed with cardiac catheterization.  - Schedule right and left heart catheterization with Dr. Wonda (May 06, 2024) - He will be brought in 4 hours early for IV hydration  - Hold Eliquis  for two days before catheterization. - Hold Jardiance  for three days before catheterization. - Hold Lasix  the day before and the day of catheterization.

## 2024-04-18 NOTE — Assessment & Plan Note (Signed)
 History of bypass in 1988 and redo in 1996 and multiple PCI procedures.  His last cardiac catheterization in Jan 2024 demonstrated patent left main to LCx stent, patent SVG-RPDA stent, patent LIMA-LAD, known occlusion of the RCA, LAD. There were no targets for PCI and med Rx was continued.  He is doing well without anginal symptoms and has not had to used NTG since the Summer. - Continue amlodipine  5 mg daily, Plavix  75 mg daily, Repatha  140 mg every 2 weeks, Imdur  120 mg daily, Toprol -XL 100 mg daily, ranolazine  500 mg twice daily

## 2024-04-18 NOTE — Assessment & Plan Note (Signed)
 S/p recent DCCV in 11/2023. He has a hx of Amio lung toxicity. Maintaining NSR on EKG today.  - Continue Eliquis  5 mg twice daily, Metoprolol  succinate 100 mg once daily  - Dose will need to be reduced to 2.5 mg once he reaches 78 years old

## 2024-04-18 NOTE — Assessment & Plan Note (Signed)
 EF 40-45 by recent TTE. He has had more difficulty with volume excess recently requiring adjustments in diuretics. This is in the setting of advanced chronic kidney disease and progressive aortic stenosis. He did have reduced weight with a couple extra days of Furosemide . However, his weight has crept up again. I have recommended he take extra Furosmide as needed for 3 lb weight gain.  - Continue Jardiance  10 mg once daily, Hydralazine  25 mg twice daily, Imdur  120 mg once daily, Metoprolol  succinate 100 mg once daily, Furosemide  80 mg once daily   - Take extra Furosemide  80 mg once daily as needed if wt increases 3 lbs or more until back to baseline

## 2024-04-18 NOTE — H&P (View-Only) (Signed)
 {This patient may be at risk for Amyloid. He has one or more dx on the prob list or PMH from the following list -  Abnormal EKG, HFpEF/Diastolic CHF, Aortic Stenosis, LVH, Bilateral Carpal Tunnel Syndrome, Biceps Tendon Rupture, Spinal Stenosis, Pericardial Effusion, Left Atrial Enlargement, Conduction System Disorder. See list below or review PMH.  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     Incomplete heart block 2:1 05/20/2011     Persistent atrial fibrillation (HCC) 06/24/2021     PTVDP '07 with gen change May 2016 05/20/2010    Click HERE to open Cardiac Amyloid Screening SmartSet to order screening OR Click HERE to defer testing for 1 year or permanently :1}     OFFICE NOTE:    Date:  04/18/2024  ID:  Kyle Avila, DOB April 06, 1946, MRN [id number] PCP: Janey Santos, MD  [facility] Providers Cardiologist:  [name], MD Cardiology APP:  Lelon Glendia DASEN, PA-C  Electrophysiologist:  Fonda Kitty, MD { Click to update primary MD,subspecialty MD or APP then REFRESH:1}      *** Patient Profile:   Coronary artery disease S/p CABG in 1988; redo in 1996 S/p multiple PCI procedures (DES x 2 to LCx in 2009, DES to S-PDA in 2016, DES x 2 to S-PDA in 2018, DES to RPDA and cutting balloon POBA to S-RPDA (ISR) 6/21; POBA to both stented segments of S-PDA in 07/2021) LHC 05/21/2022: LM 100, LAD ostial 100, mid 65 (at anastomosis), distal 40 (after anastomosis); RI ostial 100; LM to LCx stent patent, OM1 100; RCA ostial 100 CTO, RPDA stent 95 ISR; SVG-RPDA stent patent with 30 ISR, 40 ISR; SVG-OM1 100 CTO; LIMA-LAD patent>> med Rx (+ Ranexa ) HFmrEF (heart failure with mildly reduced ejection fraction)  Ischemic CM TTE 02/26/2022: EF 45-50, mildly reduced RVSF, RVSP 33.9, mild to moderate MR, AV sclerosis, RAP 3   TTE 01/06/23: EF 45-50, mild ASH, inf-lat HK, ant-sept, inf-sept, ant, inf HK, NL RVSF, NL PASP, mild LAE, mild AS (mean  16) TTE 04/15/24: EF 40-45, mild LVH, mod reduced RVSF, mod elevated PASP, RVSP 58.3, mild LAE, mod RAE, mild MR, trivial AI, mod LFLG AS (mean 17, Vmax 258 cm/s, DI 0.28) Aortic stenosis  Persistent atrial fibrillation  S/p DCCV 06/2021 >> ERAF Amiodarone  DC'd (Lung toxicity; failed DCCV) Completed course of Prednisone   Dr. Annella ([facility]) S/p DCCV 11/2023 Mild to mod mitral regurgitation  Carotid artery disease S/p R CEA Carotid US  04/18/2022: Patent R CEA; L ICA 1-39; R VA stenosis CHB, s/p pacemaker S/p Upgrade to CRT 03/2022 Chronic kidney disease stage IV Hypertension Hyperlipidemia          Discussed the use of AI scribe software for clinical note transcription with the patient, who gave verbal consent to proceed. History of Present Illness UEL Kyle Avila is a 78 y.o. male for evaluation of shortness of breath. He was last seen by Dr. Fell in 02/2024 and was doing quite well. He had previously undergone DCCV in 11/2023 for persistent atrial fibrillation. Recent device interrogation demonstrated elevated fluid levels and his diuretic was briefly adjusted with improved symptoms. He traveled to [place]  earlier in December and experienced issues with hypoxia. There are several MyChart messages which have been reviewed. He was apparently seen out there and started on O2. His device was interrogated again after he returned and his impedance numbers suggested accumulating fluid again.  His diuretics were adjusted again. He had a follow up echocardiogram done last week. Dr. Wonda personally reviewed this and noted progression of aortic stenosis, but still in mod range. His AV leaflets appear moderately to possibly severely restricted. Given his recent symptoms of CHF, it is reasonable to pursue R and L cardiac catheterization to further evaluate.     ROS-See HPI***    Studies Reviewed:       LABS 12/18/23: K 4.7, SCr 2.89, eGFR 21, Hgb 13.2, PLT 150K Results  Risk  Assessment/Calculations: {Does this patient have ATRIAL FIBRILLATION?:[id number]} No BP recorded.  {Refresh Note OR Click here to enter BP  :1}***      Physical Exam:  VS:  There were no vitals taken for this visit.       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 He has had more difficulty with volume excess recently requiring adjustments in diuretics. This is in the setting of advanced chronic kidney disease and progressive aortic stenosis. *** Persistent atrial fibrillation (HCC) S/p recent DCCV in 11/2023. He has a hx of Amio lung toxicity. *** Coronary artery disease of bypass graft of native heart with stable angina pectoris History of bypass in 1988 and redo in 1996 and multiple PCI procedures.  His last cardiac catheterization in Jan 2024 demonstrated patent left main to LCx stent, patent SVG-RPDA stent, patent LIMA-LAD, known occlusion of the RCA, LAD. There were no targets for PCI and med Rx was continued.  *** Moderate aortic stenosis Progressive aortic stenosis noted on recent echocardiogram. It appears to be in the mod range, but this is difficult to know with reduced LVF. He also has mod to possibly severe leaflet restriction. There is concern his aortic stenosis is contributing to his current symptoms. R and L cardiac catheterization has been considered. 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  NYHA II.  Volume status stable.  He feels much better on the lower dose of metoprolol  succinate.  However, he does not quite feel back to baseline.  He does admit that  his activity level has been lower.  We discussed increasing his activity level over time to see if this improves his symptoms.  Continue Jardiance  10 mg daily, hydralazine  25 mg 3 times a day, Imdur  120 mg daily, metoprolol  succinate 100 mg daily.  Keep follow-up with Dr. Wonda in August.   CAD (coronary artery disease) History of bypass in 1988 and redo in 1996 and multiple PCI procedures.  Most recent cardiac catheterization in Jan 2024 demonstrated patent left main to LCx stent, patent SVG-RPDA stent, patent LIMA-LAD, known occlusion of the RCA, LAD. There were no targets for PCI and med Rx was continued.  He is doing well without significant symptoms to suggest angina.  Continue amlodipine  5 mg daily, Plavix  75 mg daily, Imdur  120 mg daily, metoprolol  succinate 100 mg daily, nitroglycerin  as needed chest pain, ranolazine  500 mg twice daily.  Keep follow-up in August as planned.   Hypertension His blood pressure  here is optimal.  He has several readings at home in the 130s to 160s.  I have recommended that he monitor his blood pressure by checking it several hours after his medications, after resting for 20 minutes.  Continue amlodipine  5 mg daily, hydralazine  25 mg 3 times a day, Imdur  120 mg daily, metoprolol  succinate 100 mg daily.   Moderate mitral regurgitation Moderate by echo in November 2023.  Consider repeat echo again in November 2024.   Complete heart block s/p PPM Status post an upgrade to CRT.  Follow-up with EP as planned.   Hyperlipidemia LDL excellent in December 2023 at 15.  Continue Repatha  140 mg every 14 days.   Persistent atrial fibrillation (HCC) No atrial fibrillation detected on recent device interrogation.  Continue metoprolol  succinate 100 mg daily.  His weight is >60 kg and his age is <79.  Continue Eliquis  5 mg twice daily.    :1}    {Are you ordering a CV Procedure (e.g. stress test, cath, DCCV, TEE, etc)?   Press F2        :[id number]}  Dispo:  No follow-ups on  file.  Signed, Glendia Ferrier, PA-C

## 2024-04-19 ENCOUNTER — Encounter: Payer: Self-pay | Admitting: Physician Assistant

## 2024-04-19 ENCOUNTER — Ambulatory Visit: Attending: Physician Assistant | Admitting: Physician Assistant

## 2024-04-19 VITALS — BP 140/70 | HR 74 | Ht 68.0 in | Wt 157.0 lb

## 2024-04-19 DIAGNOSIS — I4819 Other persistent atrial fibrillation: Secondary | ICD-10-CM

## 2024-04-19 DIAGNOSIS — I10 Essential (primary) hypertension: Secondary | ICD-10-CM | POA: Diagnosis not present

## 2024-04-19 DIAGNOSIS — N184 Chronic kidney disease, stage 4 (severe): Secondary | ICD-10-CM | POA: Diagnosis not present

## 2024-04-19 DIAGNOSIS — I5020 Unspecified systolic (congestive) heart failure: Secondary | ICD-10-CM | POA: Diagnosis not present

## 2024-04-19 DIAGNOSIS — I350 Nonrheumatic aortic (valve) stenosis: Secondary | ICD-10-CM

## 2024-04-19 DIAGNOSIS — I25708 Atherosclerosis of coronary artery bypass graft(s), unspecified, with other forms of angina pectoris: Secondary | ICD-10-CM | POA: Diagnosis not present

## 2024-04-19 DIAGNOSIS — I442 Atrioventricular block, complete: Secondary | ICD-10-CM

## 2024-04-19 LAB — CBC

## 2024-04-19 NOTE — Assessment & Plan Note (Addendum)
Follow up with EP as planned. 

## 2024-04-19 NOTE — Assessment & Plan Note (Addendum)
 Followed by Dr. Jerrye with  [facility]. As noted, he will be brought in early for IV hydration to protect renal function as much as possible.

## 2024-04-19 NOTE — Assessment & Plan Note (Addendum)
 Blood pressure elevated today.  He has not had his medications yet.  Blood pressure is usually well-controlled. - Continue to monitor BP.  - Continue amlodipine  5 mg daily, Imdur  120 mg daily, Toprol -XL 100 mg daily, hydralazine  25 mg twice daily

## 2024-04-19 NOTE — Patient Instructions (Addendum)
 Thank you for choosing [facility]!     Medication Instructions:   *If you need a refill on your cardiac medications before your next appointment, please call your pharmacy*   Lab Work: BMET, CBC If you have labs (blood work) drawn today and your tests are completely normal, you will receive your results only by: MyChart Message (if you have MyChart) OR A paper copy in the mail If you have any lab test that is abnormal or we need to change your treatment, we will call you to review the results.  Your next appointment:   2 week(s)   Provider:   [name], MD or Glendia Ferrier    Follow-Up: At [facility], you and your health needs are our priority.  As part of our continuing mission to provide you with exceptional heart care, we have created designated Provider Care Teams.  These Care Teams include your primary Cardiologist (physician) and Advanced Practice Providers (APPs -  Physician Assistants and Nurse Practitioners) who all work together to provide you with the care you need, when you need it. We recommend signing up for the patient portal called MyChart.  Sign up information is provided on this After Visit Summary.  MyChart is used to connect with patients for Virtual Visits (Telemedicine).  Patients are able to view lab/test results, encounter notes, upcoming appointments, etc.  Non-urgent messages can be sent to your provider as well.   To learn more about what you can do with MyChart, go to [email or web address].         [facility] A DEPT OF [place].  [facility] AT [address] A DEPT OF THE [place]. [facility] [address] [place] [address] Dept: [phone] Loc: [phone]  Kyle Avila  04/19/2024  You are scheduled for a Cardiac Catheterization on Friday, January 9 with Dr. Dr. Mr. Fell  1. Please arrive at the [facility] (Main Entrance A) at [facility]: [address]  [place], [place] [address] at 6:00 AM (This time is 4 hour(s) before your procedure to ensure your preparation).   Free valet parking service is available. You will check in at ADMITTING. The support person will be asked to wait in the waiting room.  It is OK to have someone drop you off and come back when you are ready to be discharged.    Special note: Every effort is made to have your procedure done on time. Please understand that emergencies sometimes delay scheduled procedures.  2. Diet: Nothing to eat after midnight.   3. Hydration: On January 9, you may drink approved liquids (see below) until 4 hours before the procedure time.       List of approved liquids water, clear juice, clear tea, black coffee, fruit juices, non-citric and without pulp, carbonated beverages, Gatorade, Kool -Aid, plain Jello-O and plain ice popsicles.  4. Labs: You will need to have blood drawn on Tuesday, December 23 at [facility] (1st Floor), [address], [place], [place] [address]. You do not need to be fasting.  5. Medication instructions in preparation for your procedure:   Contrast Allergy: No  Hold Eliquis  2 days  Stop taking, Lasix  (Furosemide )  Thursday, January 8,     Stop taking JardianceWednesday, January 6,  On the morning of your procedure, take your Plavix /Clopidogrel  and any morning medicines NOT listed above.  You may use sips of water.  6. Plan to go home the same day, you will only stay overnight if medically necessary. 7. Bring a current list of your medications and current insurance cards. 8. You MUST have a responsible person to drive you home. 9. Someone MUST be with you the first 24 hours after you arrive home or your discharge will be delayed. 10. Please wear clothes that are easy to get on and off and wear slip-on shoes.  Thank you for allowing us  to care for you!   -- [facility]

## 2024-04-20 ENCOUNTER — Ambulatory Visit: Payer: Self-pay | Admitting: Physician Assistant

## 2024-04-20 LAB — BASIC METABOLIC PANEL WITH GFR
BUN/Creatinine Ratio: 21 (ref 10–24)
BUN: 64 mg/dL — ABNORMAL HIGH (ref 8–27)
CO2: 20 mmol/L (ref 20–29)
Calcium: 9.2 mg/dL (ref 8.6–10.2)
Chloride: 102 mmol/L (ref 96–106)
Creatinine, Ser: 2.99 mg/dL — ABNORMAL HIGH (ref 0.76–1.27)
Glucose: 115 mg/dL — ABNORMAL HIGH (ref 70–99)
Potassium: 4.6 mmol/L (ref 3.5–5.2)
Sodium: 140 mmol/L (ref 134–144)
eGFR: 21 mL/min/1.73 — ABNORMAL LOW

## 2024-04-20 LAB — CBC
Hematocrit: 40.5 % (ref 37.5–51.0)
Hemoglobin: 13.5 g/dL (ref 13.0–17.7)
MCH: 37 pg — ABNORMAL HIGH (ref 26.6–33.0)
MCHC: 33.3 g/dL (ref 31.5–35.7)
MCV: 111 fL — ABNORMAL HIGH (ref 79–97)
Platelets: 170 x10E3/uL (ref 150–450)
RBC: 3.65 x10E6/uL — ABNORMAL LOW (ref 4.14–5.80)
RDW: 13.3 % (ref 11.6–15.4)
WBC: 3.6 x10E3/uL (ref 3.4–10.8)

## 2024-04-26 ENCOUNTER — Ambulatory Visit: Admitting: Podiatry

## 2024-04-26 DIAGNOSIS — L600 Ingrowing nail: Secondary | ICD-10-CM | POA: Diagnosis not present

## 2024-04-26 NOTE — Patient Instructions (Signed)

## 2024-04-26 NOTE — Progress Notes (Signed)
 "  Subjective:  Patient ID: Kyle Avila, male    DOB: 02-21-1946,  MRN: [id number]  Chief Complaint  Patient presents with   Toe Pain    Pt stated that he has been having some discomfort in his toe    78 y.o. male presents with the above complaint.  Patient presents with left hallux medial border ingrown painful to touch is progressing and worse worse with ambulation is with pressure he would like to have removed has not seen MRI as part of same he denies any other acute complaints   Review of Systems: Negative except as noted in the HPI. Denies N/V/F/Ch.  Past Medical History:  Diagnosis Date   Arthritis    some in my back (04/14/2017)   CAD (coronary artery disease)    a.  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 There is no height or weight on file to calculate BMI. Constitutional Well developed. Well nourished.  Vascular Dorsalis pedis pulses palpable bilaterally. Posterior tibial pulses palpable bilaterally. Capillary refill normal to all digits.  No cyanosis or clubbing noted. Pedal hair growth normal.  Neurologic Normal speech. Oriented to person, place,  and time. Epicritic sensation to light touch grossly present bilaterally.  Dermatologic Painful ingrowing nail at medial nail borders of the hallux nail left. No other open wounds. No skin lesions.  Orthopedic: Normal joint ROM without pain or crepitus bilaterally. No visible deformities. No bony tenderness.   Radiographs: None Assessment:   1. Ingrown left big toenail    Plan:  Patient was evaluated and treated and all questions answered.  Ingrown Nail, left -Patient elects to proceed with minor surgery to remove ingrown toenail removal today. Consent reviewed and signed by patient. -Ingrown nail excised. See procedure note. -Educated on post-procedure care including soaking. Written instructions provided and reviewed. -Patient to follow up in 2 weeks for nail check if needed  Procedure: Excision of Ingrown Toenail Location: Left 1st toe medial nail borders. Anesthesia: Lidocaine  1% plain; 1.5 mL and Marcaine 0.5% plain; 1.5 mL, digital block. Skin Prep: Betadine . Dressing: Silvadene; telfa; dry, sterile, compression dressing. Technique: Following skin prep, the toe was exsanguinated and a tourniquet was secured at the base of the toe. The affected nail border was freed, split with a nail splitter, and excised. Chemical matrixectomy was then performed with phenol and irrigated out with alcohol . The tourniquet was then removed and sterile dressing applied. Disposition: Patient tolerated procedure well. Patient to return in 2 weeks for follow-up.   No follow-ups on file.    [1]  Current Outpatient Medications:    acetaminophen  (TYLENOL ) 500 MG tablet, Take 1,000 mg by mouth every 6 (six) hours as needed for moderate pain., Disp: , Rfl:    Aflibercept  (EYLEA ) 2 MG/0.05ML SOLN, by Intravitreal route., Disp: , Rfl:    allopurinol  (ZYLOPRIM ) 100 MG tablet, Take 100 mg by mouth at bedtime., Disp: , Rfl:    amLODipine  (NORVASC ) 5 MG tablet, TAKE 1 TABLET EVERY DAY, Disp: 90 tablet,  Rfl: 3   apixaban  (ELIQUIS ) 5 MG TABS tablet, Take 1 tablet (5 mg total) by mouth 2 (two) times daily., Disp: 180 tablet, Rfl: 3   azelastine  (ASTELIN ) 0.1 % nasal spray, Place 1 spray into both nostrils 2 (two) times daily as needed for rhinitis or allergies. Use in each nostril as directed, Disp: , Rfl:    clopidogrel  (PLAVIX ) 75 MG tablet, TAKE 1 TABLET AT BEDTIME, Disp: 90 tablet, Rfl: 3   empagliflozin  (JARDIANCE ) 10 MG TABS tablet, Take 1 tablet (10 mg total) by mouth daily before breakfast., Disp: 90 tablet, Rfl: 3   Evolocumab  (REPATHA  SURECLICK) 140 MG/ML SOAJ, Inject 140 mg into the skin every 14 (fourteen) days., Disp: 6 mL, Rfl: 3   fexofenadine (ALLEGRA) 180 MG tablet, Take 180 mg by mouth daily., Disp: , Rfl:    furosemide  (LASIX ) 80 MG tablet, TAKE 1 TABLET EVERY DAY, Disp: 90 tablet, Rfl: 3   hydrALAZINE  (APRESOLINE ) 25 MG tablet, Take 1 tablet (25 mg total) by mouth 3 (three) times daily. (Patient taking differently: Take 25 mg by mouth 2 (two) times daily.), Disp: 270 tablet, Rfl: 3   isosorbide  mononitrate (IMDUR ) 120 MG 24 hr tablet, TAKE 1 TABLET (120 MG TOTAL) BY MOUTH DAILY., Disp: 90 tablet, Rfl: 3   ketoconazole (NIZORAL) 2 % cream, Apply 1 application. topically daily as needed (itching toes)., Disp: , Rfl:    metoprolol  succinate (TOPROL -XL) 100 MG 24 hr tablet, TAKE 1 TABLET (100 MG TOTAL) BY MOUTH DAILY. TAKE WITH OR IMMEDIATELY FOLLOWING A MEAL., Disp: 90 tablet, Rfl: 3   nitroGLYCERIN  (NITROSTAT ) 0.4 MG SL tablet, Place 1 tablet (0.4 mg total) under the tongue every 5 (five) minutes as needed for chest pain., Disp: 75 tablet, Rfl: 1   ranolazine  (RANEXA ) 500 MG 12 hr tablet, TAKE 1 TABLET TWICE DAILY, Disp: 180 tablet, Rfl: 1   tamsulosin  (FLOMAX ) 0.4 MG CAPS, Take 0.4 mg by mouth daily., Disp: , Rfl:  [2]  Social History Tobacco Use  Smoking Status Never   Passive exposure: Yes  Smokeless Tobacco Never  Tobacco Comments   Never smoke 10/21/21  [3]   Allergies Allergen Reactions   Amiodarone  Other (See Comments)    Lung toxicity    "

## 2024-04-29 ENCOUNTER — Telehealth: Payer: Self-pay

## 2024-04-29 NOTE — Telephone Encounter (Signed)
 Patient called in wanting us  to review his transmission he sent in on 04/27/24.  He wanted an update on his fluid level.   04/27/24 Transmission: Normal Device Function Optivol/HF logistics are normal and stable on transmission. No new episodes.  Patient made aware.  When asked how he is doing,  Not worth a damn!,  I have a bad attitude and that doesn't help.  No specific changes other than ongoing difficulty with his stamina and gives out easily.  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  Nothing specific to report for his symptoms in terms of change, just wanted an update in his fluid status.    Patient knows to call us  with any concerning changes.  He just recently saw Glendia Ferrier on 04/19/24, discussion for TAVR and likely aortic stenosis contributing to symptoms.  He has a cath scheduled with Dr. Wonda on 05/06/24 and EP f/u with Dr. Kennyth on 05/17/24.

## 2024-05-01 ENCOUNTER — Encounter: Payer: Self-pay | Admitting: Cardiovascular Disease

## 2024-05-02 ENCOUNTER — Encounter: Payer: Self-pay | Admitting: Cardiovascular Disease

## 2024-05-03 ENCOUNTER — Encounter: Payer: Self-pay | Admitting: Cardiovascular Disease

## 2024-05-04 ENCOUNTER — Telehealth: Payer: Self-pay | Admitting: *Deleted

## 2024-05-04 NOTE — Telephone Encounter (Signed)
 Cardiac Catheterization scheduled at [facility] for: Friday May 06, 2024 10:30 AM Arrival time [facility] Main Entrance A at: 6 AM-pre-procedure hydration  Diet: -Nothing to eat after midnight.  Hydration: -May drink clear liquids until 2 hours before the procedure.  Approved liquids: Water, clear tea, black coffee, fruit juices-non-citric and without pulp,Gatorade, plain Jello/popsicles.  No PO hydration (bottle of water)-going in early for IVF  Medication instructions: -Hold:  Eliquis -none 05/04/24 until post procedure  Jardiance -AM of procedure  Lasix -day before and day of procedure-per protocol GFR < 60 (21) -Other usual morning medications can be taken including aspirin  81 mg and Plavix  75 mg.  Plan to go home the same day, you will only stay overnight if medically necessary.  You must have responsible adult to drive you home.  Someone must be with you the first 24 hours after you arrive home.  Reviewed procedure instructions/pre-procedure hydration with patient.  Patient tells me has had a chronic cough, is afebrile, but did have COVID and flu test yesterday. Patient tells me he was told if he did not get a call with results they were negative-he did not get a call.

## 2024-05-05 ENCOUNTER — Other Ambulatory Visit: Payer: Self-pay | Admitting: Cardiovascular Disease

## 2024-05-05 NOTE — Telephone Encounter (Signed)
*  STAT* If patient is at the pharmacy, call can be transferred to refill team.     1. Which medications need to be refilled? (please list name of each medication and dose if known) apixaban  (ELIQUIS ) 5 MG TABS tablet    empagliflozin  (JARDIANCE ) 10 MG TABS tablet  ranolazine  (RANEXA ) 500 MG 12 hr tablet    2. Would you like to learn more about the convenience, safety, & potential cost savings by using the [facility]? No     3. Are you open to using the [facility] (Type [facility]. ). No     4. Which pharmacy/location (including street and city if local pharmacy) is medication to be sent to?[facility] - [place], [place] - [address]      5. Do they need a 30 day or 90 day supply? 90 day

## 2024-05-05 NOTE — Telephone Encounter (Signed)
 Prescription refill request for Eliquis  received. Indication: A. FIB Last office visit: 04/19/24 Scr: 2.99 Age: 78 Weight: 71.2 kg   Scr elevated. Will send to pharmacist for advisement.

## 2024-05-06 ENCOUNTER — Inpatient Hospital Stay (HOSPITAL_COMMUNITY)
Admission: AD | Admit: 2024-05-06 | Discharge: 2024-05-11 | DRG: 286 | Disposition: A | Discharge status: Home / Self Care | Attending: Cardiology | Admitting: Cardiology

## 2024-05-06 ENCOUNTER — Other Ambulatory Visit: Payer: Self-pay

## 2024-05-06 ENCOUNTER — Encounter (HOSPITAL_COMMUNITY): Admission: RE | Disposition: A | Payer: Self-pay | Source: Home / Self Care | Attending: Cardiovascular Disease

## 2024-05-06 DIAGNOSIS — I5023 Acute on chronic systolic (congestive) heart failure: Secondary | ICD-10-CM | POA: Diagnosis present

## 2024-05-06 DIAGNOSIS — Z8249 Family history of ischemic heart disease and other diseases of the circulatory system: Secondary | ICD-10-CM | POA: Diagnosis not present

## 2024-05-06 DIAGNOSIS — Z7984 Long term (current) use of oral hypoglycemic drugs: Secondary | ICD-10-CM

## 2024-05-06 DIAGNOSIS — I252 Old myocardial infarction: Secondary | ICD-10-CM | POA: Diagnosis not present

## 2024-05-06 DIAGNOSIS — Z955 Presence of coronary angioplasty implant and graft: Secondary | ICD-10-CM | POA: Diagnosis not present

## 2024-05-06 DIAGNOSIS — I130 Hypertensive heart and chronic kidney disease with heart failure and stage 1 through stage 4 chronic kidney disease, or unspecified chronic kidney disease: Principal | ICD-10-CM | POA: Diagnosis present

## 2024-05-06 DIAGNOSIS — R0602 Shortness of breath: Secondary | ICD-10-CM | POA: Diagnosis present

## 2024-05-06 DIAGNOSIS — I5043 Acute on chronic combined systolic (congestive) and diastolic (congestive) heart failure: Principal | ICD-10-CM

## 2024-05-06 DIAGNOSIS — I442 Atrioventricular block, complete: Secondary | ICD-10-CM | POA: Diagnosis present

## 2024-05-06 DIAGNOSIS — Z7901 Long term (current) use of anticoagulants: Secondary | ICD-10-CM | POA: Diagnosis not present

## 2024-05-06 DIAGNOSIS — Z950 Presence of cardiac pacemaker: Secondary | ICD-10-CM | POA: Diagnosis not present

## 2024-05-06 DIAGNOSIS — Z7902 Long term (current) use of antithrombotics/antiplatelets: Secondary | ICD-10-CM | POA: Diagnosis not present

## 2024-05-06 DIAGNOSIS — Z951 Presence of aortocoronary bypass graft: Secondary | ICD-10-CM

## 2024-05-06 DIAGNOSIS — I2510 Atherosclerotic heart disease of native coronary artery without angina pectoris: Secondary | ICD-10-CM

## 2024-05-06 DIAGNOSIS — I350 Nonrheumatic aortic (valve) stenosis: Secondary | ICD-10-CM

## 2024-05-06 DIAGNOSIS — Z85828 Personal history of other malignant neoplasm of skin: Secondary | ICD-10-CM | POA: Diagnosis not present

## 2024-05-06 DIAGNOSIS — I255 Ischemic cardiomyopathy: Secondary | ICD-10-CM | POA: Diagnosis present

## 2024-05-06 DIAGNOSIS — E876 Hypokalemia: Secondary | ICD-10-CM | POA: Diagnosis present

## 2024-05-06 DIAGNOSIS — I5020 Unspecified systolic (congestive) heart failure: Secondary | ICD-10-CM

## 2024-05-06 DIAGNOSIS — Z79899 Other long term (current) drug therapy: Secondary | ICD-10-CM | POA: Diagnosis not present

## 2024-05-06 DIAGNOSIS — I25708 Atherosclerosis of coronary artery bypass graft(s), unspecified, with other forms of angina pectoris: Secondary | ICD-10-CM

## 2024-05-06 DIAGNOSIS — N184 Chronic kidney disease, stage 4 (severe): Secondary | ICD-10-CM | POA: Diagnosis present

## 2024-05-06 DIAGNOSIS — E785 Hyperlipidemia, unspecified: Secondary | ICD-10-CM | POA: Diagnosis present

## 2024-05-06 DIAGNOSIS — I10 Essential (primary) hypertension: Secondary | ICD-10-CM

## 2024-05-06 DIAGNOSIS — I4819 Other persistent atrial fibrillation: Secondary | ICD-10-CM | POA: Diagnosis present

## 2024-05-06 DIAGNOSIS — I25119 Atherosclerotic heart disease of native coronary artery with unspecified angina pectoris: Secondary | ICD-10-CM

## 2024-05-06 DIAGNOSIS — N179 Acute kidney failure, unspecified: Secondary | ICD-10-CM | POA: Diagnosis present

## 2024-05-06 HISTORY — PX: RIGHT/LEFT HEART CATH AND CORONARY/GRAFT ANGIOGRAPHY: CATH118267

## 2024-05-06 LAB — COMPREHENSIVE METABOLIC PANEL WITH GFR
ALT: 22 U/L (ref 0–44)
AST: 25 U/L (ref 15–41)
Albumin: 3.7 g/dL (ref 3.5–5.0)
Alkaline Phosphatase: 107 U/L (ref 38–126)
Anion gap: 12 (ref 5–15)
BUN: 64 mg/dL — ABNORMAL HIGH (ref 8–23)
CO2: 22 mmol/L (ref 22–32)
Calcium: 9.1 mg/dL (ref 8.9–10.3)
Chloride: 105 mmol/L (ref 98–111)
Creatinine, Ser: 2.96 mg/dL — ABNORMAL HIGH (ref 0.61–1.24)
GFR, Estimated: 21 mL/min — ABNORMAL LOW
Glucose, Bld: 105 mg/dL — ABNORMAL HIGH (ref 70–99)
Potassium: 4.8 mmol/L (ref 3.5–5.1)
Sodium: 139 mmol/L (ref 135–145)
Total Bilirubin: 0.5 mg/dL (ref 0.0–1.2)
Total Protein: 6.4 g/dL — ABNORMAL LOW (ref 6.5–8.1)

## 2024-05-06 LAB — CBC
HCT: 36.5 % — ABNORMAL LOW (ref 39.0–52.0)
Hemoglobin: 12 g/dL — ABNORMAL LOW (ref 13.0–17.0)
MCH: 35.8 pg — ABNORMAL HIGH (ref 26.0–34.0)
MCHC: 32.9 g/dL (ref 30.0–36.0)
MCV: 109 fL — ABNORMAL HIGH (ref 80.0–100.0)
Platelets: 137 K/uL — ABNORMAL LOW (ref 150–400)
RBC: 3.35 MIL/uL — ABNORMAL LOW (ref 4.22–5.81)
RDW: 15.2 % (ref 11.5–15.5)
WBC: 4.5 K/uL (ref 4.0–10.5)
nRBC: 0 % (ref 0.0–0.2)

## 2024-05-06 LAB — MAGNESIUM: Magnesium: 2.6 mg/dL — ABNORMAL HIGH (ref 1.7–2.4)

## 2024-05-06 MED ORDER — APIXABAN 5 MG PO TABS
5.0000 mg | ORAL_TABLET | Freq: Two times a day (BID) | ORAL | Status: DC
  Administered 2024-05-07 – 2024-05-11 (×9): 5 mg via ORAL
  Filled 2024-05-06 (×9): qty 1

## 2024-05-06 MED ORDER — HYDRALAZINE HCL 25 MG PO TABS
25.0000 mg | ORAL_TABLET | Freq: Two times a day (BID) | ORAL | Status: DC
  Administered 2024-05-06 – 2024-05-07 (×2): 25 mg via ORAL
  Filled 2024-05-06 (×2): qty 1

## 2024-05-06 MED ORDER — FUROSEMIDE 10 MG/ML IJ SOLN
INTRAMUSCULAR | Status: AC
  Filled 2024-05-06: qty 8

## 2024-05-06 MED ORDER — SODIUM CHLORIDE 0.9% FLUSH: 3.0000 mL | INTRAVENOUS | Status: DC | PRN

## 2024-05-06 MED ORDER — SODIUM CHLORIDE 0.9% FLUSH
3.0000 mL | Freq: Two times a day (BID) | INTRAVENOUS | Status: DC
  Administered 2024-05-06 – 2024-05-10 (×7): 3 mL via INTRAVENOUS

## 2024-05-06 MED ORDER — FENTANYL CITRATE (PF) 100 MCG/2ML IJ SOLN
INTRAMUSCULAR | Status: AC
  Filled 2024-05-06: qty 2

## 2024-05-06 MED ORDER — FUROSEMIDE 10 MG/ML IJ SOLN
80.0000 mg | Freq: Two times a day (BID) | INTRAMUSCULAR | Status: DC
  Administered 2024-05-06: 80 mg via INTRAVENOUS

## 2024-05-06 MED ORDER — RANOLAZINE ER 500 MG PO TB12
500.0000 mg | ORAL_TABLET | Freq: Two times a day (BID) | ORAL | Status: DC
  Administered 2024-05-06 – 2024-05-11 (×10): 500 mg via ORAL
  Filled 2024-05-06 (×11): qty 1

## 2024-05-06 MED ORDER — EMPAGLIFLOZIN 10 MG PO TABS: 10.0000 mg | ORAL_TABLET | Freq: Every day | ORAL | 3 refills | Status: AC

## 2024-05-06 MED ORDER — CLOPIDOGREL BISULFATE 75 MG PO TABS: 75.0000 mg | ORAL_TABLET | Freq: Every day | ORAL | Status: DC

## 2024-05-06 MED ORDER — ASPIRIN 81 MG PO CHEW: 81.0000 mg | CHEWABLE_TABLET | ORAL | Status: DC

## 2024-05-06 MED ORDER — DOBUTAMINE-DEXTROSE 4-5 MG/ML-% IV SOLN
INTRAVENOUS | Status: AC
  Filled 2024-05-06: qty 250

## 2024-05-06 MED ORDER — HYDRALAZINE HCL 20 MG/ML IJ SOLN: 10.0000 mg | INTRAMUSCULAR | Status: DC | PRN

## 2024-05-06 MED ORDER — TAMSULOSIN HCL 0.4 MG PO CAPS
0.4000 mg | ORAL_CAPSULE | Freq: Every day | ORAL | Status: DC
  Administered 2024-05-06 – 2024-05-10 (×5): 0.4 mg via ORAL
  Filled 2024-05-06 (×7): qty 1

## 2024-05-06 MED ORDER — METOPROLOL SUCCINATE ER 25 MG PO TB24
25.0000 mg | ORAL_TABLET | Freq: Every day | ORAL | Status: DC
  Administered 2024-05-07 – 2024-05-11 (×5): 25 mg via ORAL
  Filled 2024-05-06 (×5): qty 1

## 2024-05-06 MED ORDER — IOHEXOL 350 MG/ML SOLN
INTRAVENOUS | Status: DC | PRN
  Administered 2024-05-06: 30 mL

## 2024-05-06 MED ORDER — EMPAGLIFLOZIN 10 MG PO TABS
10.0000 mg | ORAL_TABLET | Freq: Every day | ORAL | Status: DC
  Administered 2024-05-07 – 2024-05-11 (×5): 10 mg via ORAL
  Filled 2024-05-06 (×5): qty 1

## 2024-05-06 MED ORDER — HEPARIN (PORCINE) IN NACL 1000-0.9 UT/500ML-% IV SOLN
INTRAVENOUS | Status: DC | PRN
  Administered 2024-05-06 (×3): 500 mL

## 2024-05-06 MED ORDER — LORATADINE 10 MG PO TABS
10.0000 mg | ORAL_TABLET | Freq: Every day | ORAL | Status: DC
  Administered 2024-05-07 – 2024-05-10 (×4): 10 mg via ORAL
  Filled 2024-05-06 (×5): qty 1

## 2024-05-06 MED ORDER — FUROSEMIDE 10 MG/ML IJ SOLN
160.0000 mg | Freq: Two times a day (BID) | INTRAVENOUS | Status: DC
  Administered 2024-05-06 – 2024-05-08 (×4): 160 mg via INTRAVENOUS
  Filled 2024-05-06: qty 16
  Filled 2024-05-06: qty 10
  Filled 2024-05-06 (×5): qty 16

## 2024-05-06 MED ORDER — FENTANYL CITRATE (PF) 100 MCG/2ML IJ SOLN
INTRAMUSCULAR | Status: DC | PRN
  Administered 2024-05-06: 25 ug via INTRAVENOUS

## 2024-05-06 MED ORDER — NITROGLYCERIN 0.4 MG SL SUBL: 0.4000 mg | SUBLINGUAL_TABLET | SUBLINGUAL | Status: DC | PRN

## 2024-05-06 MED ORDER — LIDOCAINE HCL (PF) 1 % IJ SOLN
INTRAMUSCULAR | Status: AC
  Filled 2024-05-06: qty 30

## 2024-05-06 MED ORDER — DOBUTAMINE-DEXTROSE 4-5 MG/ML-% IV SOLN
INTRAVENOUS | Status: DC | PRN
  Administered 2024-05-06: 5 ug/kg/min via INTRAVENOUS

## 2024-05-06 MED ORDER — DOBUTAMINE-DEXTROSE 4-5 MG/ML-% IV SOLN
5.0000 ug/kg/min | INTRAVENOUS | Status: DC
  Administered 2024-05-08: 5 ug/kg/min via INTRAVENOUS
  Filled 2024-05-06 (×2): qty 250

## 2024-05-06 MED ORDER — MIDAZOLAM HCL (PF) 2 MG/2ML IJ SOLN
INTRAMUSCULAR | Status: DC | PRN
  Administered 2024-05-06: 1 mg via INTRAVENOUS

## 2024-05-06 MED ORDER — RANOLAZINE ER 500 MG PO TB12: 500.0000 mg | ORAL_TABLET | Freq: Two times a day (BID) | ORAL | 1 refills | Status: AC

## 2024-05-06 MED ORDER — LABETALOL HCL 5 MG/ML IV SOLN: 10.0000 mg | INTRAVENOUS | Status: DC | PRN

## 2024-05-06 MED ORDER — ACETAMINOPHEN 325 MG PO TABS
650.0000 mg | ORAL_TABLET | ORAL | Status: DC | PRN
  Administered 2024-05-08 – 2024-05-09 (×4): 650 mg via ORAL
  Filled 2024-05-06 (×4): qty 2

## 2024-05-06 MED ORDER — METOPROLOL SUCCINATE ER 100 MG PO TB24: 100.0000 mg | ORAL_TABLET | Freq: Every day | ORAL | Status: DC

## 2024-05-06 MED ORDER — CLOPIDOGREL BISULFATE 75 MG PO TABS
75.0000 mg | ORAL_TABLET | Freq: Every day | ORAL | Status: DC
  Administered 2024-05-07 – 2024-05-11 (×5): 75 mg via ORAL
  Filled 2024-05-06 (×5): qty 1

## 2024-05-06 MED ORDER — MIDAZOLAM HCL 2 MG/2ML IJ SOLN
INTRAMUSCULAR | Status: AC
  Filled 2024-05-06: qty 2

## 2024-05-06 MED ORDER — SODIUM CHLORIDE 0.9 % IV SOLN: INTRAVENOUS | Status: DC

## 2024-05-06 MED ORDER — LIDOCAINE HCL (PF) 1 % IJ SOLN
INTRAMUSCULAR | Status: DC | PRN
  Administered 2024-05-06: 8 mL via INTRADERMAL

## 2024-05-06 MED ORDER — ISOSORBIDE MONONITRATE ER 60 MG PO TB24
120.0000 mg | ORAL_TABLET | Freq: Every day | ORAL | Status: DC
  Administered 2024-05-07 – 2024-05-11 (×5): 120 mg via ORAL
  Filled 2024-05-06 (×5): qty 2

## 2024-05-06 MED ORDER — APIXABAN 5 MG PO TABS: 5.0000 mg | ORAL_TABLET | Freq: Two times a day (BID) | ORAL | 1 refills | Status: AC

## 2024-05-06 MED ORDER — ONDANSETRON HCL 4 MG/2ML IJ SOLN: 4.0000 mg | Freq: Four times a day (QID) | INTRAMUSCULAR | Status: DC | PRN

## 2024-05-06 MED ORDER — SODIUM CHLORIDE 0.9 % IV SOLN: 250.0000 mL | INTRAVENOUS | Status: AC | PRN

## 2024-05-06 MED ORDER — ALLOPURINOL 100 MG PO TABS
100.0000 mg | ORAL_TABLET | Freq: Every day | ORAL | Status: DC
  Administered 2024-05-06 – 2024-05-10 (×5): 100 mg via ORAL
  Filled 2024-05-06 (×5): qty 1

## 2024-05-06 NOTE — Interval H&P Note (Signed)
 History and Physical Interval Note:  05/06/2024 10:35 AM  Kyle Avila  has presented today for surgery, with the diagnosis of aortic stenosis.  The various methods of treatment have been discussed with the patient and family. After consideration of risks, benefits and other options for treatment, the patient has consented to  Procedures: RIGHT/LEFT HEART CATH AND CORONARY/GRAFT ANGIOGRAPHY (N/A) as a surgical intervention.  [name] history has been reviewed, patient examined, no change in status, stable for surgery.  I have reviewed the patient's chart and labs.  Questions were answered to the patient's satisfaction.     [name]

## 2024-05-06 NOTE — Progress Notes (Signed)
 Heart Failure Navigator Progress Note  Assessed for Heart & Vascular TOC clinic readiness.  Patient does not meet criteria due to he is an Advanced heart Failure team patient of Dr. Zenaida. .   Navigator will sign off at this time.   Stephane Haddock, BSN, Scientist, Clinical (histocompatibility And Immunogenetics) Only

## 2024-05-06 NOTE — H&P (Signed)
 "   Advanced Heart Failure Team History and Physical Note   PCP:  Janey Santos, MD  PCP-Cardiology: [name], MD     Reason for Admission: Acute on chronic systolic heart failure HPI:    Kyle Avila is a 79 y.o. 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  Over the last 1-2 months he's struggled with worsening shortness of breath. Had to use supplemental oxygen  while on a trip to [place] . Required multiple adjustments in diuretics for volume overload. He has been frustrated by decline in activity tolerance. Recently seen by Cardiology and was scheduled for [facility] today.   LHC with no targets for PCI. RHC: RA mean 16, PA 70/21 (39), PCWP mean 27, Fick CO/CI 3.7/2, TD CO/CI 3.06/1.6.  He is being admitted for IV diuresis with inotrope support.  Home Medications Prior to Admission medications  Medication Sig Start Date End Date Taking? Authorizing Provider  acetaminophen  (TYLENOL ) 500 MG tablet Take 1,000 mg by mouth every 6 (six) hours as needed for moderate pain.   Yes [provider]  allopurinol  (ZYLOPRIM ) 100 MG tablet Take 100 mg by mouth at bedtime.   Yes [provider]  amLODipine  (NORVASC ) 5 MG tablet TAKE 1 TABLET EVERY DAY 04/08/24  Yes Fell Ozell, MD  apixaban  (ELIQUIS ) 5 MG TABS tablet Take 1 tablet (5 mg total) by mouth 2 (two) times daily. 05/06/24  Yes Fell Ozell, MD  azelastine  (ASTELIN ) 0.1 % nasal spray Place 1 spray into both nostrils 2 (two) times daily as needed for rhinitis or allergies. Use in each nostril as directed   Yes [provider]  clopidogrel  (PLAVIX ) 75 MG tablet TAKE 1 TABLET AT BEDTIME 04/08/24  Yes Fell Ozell, MD  empagliflozin  (JARDIANCE ) 10 MG TABS tablet Take 1 tablet  (10 mg total) by mouth daily before breakfast. 05/06/24  Yes Fell Ozell, MD  Evolocumab  (REPATHA  SURECLICK) 140 MG/ML SOAJ Inject 140 mg into the skin every 14 (fourteen) days. 09/22/23  Yes Cooper, Michael, MD  fexofenadine (ALLEGRA) 180 MG tablet Take 180 mg by mouth daily.   Yes [provider]  furosemide  (LASIX ) 80 MG tablet TAKE 1 TABLET EVERY DAY 04/08/24  Yes Fell Ozell, MD  hydrALAZINE  (APRESOLINE ) 25 MG tablet Take 1 tablet (25 mg total) by mouth 3 (three) times daily. Patient taking differently: Take 25 mg by mouth 2 (two) times daily. 05/26/23  Yes Fell Ozell, MD  isosorbide  mononitrate (IMDUR ) 120 MG 24 hr tablet TAKE 1 TABLET (120 MG TOTAL) BY MOUTH DAILY. 11/11/21  Yes Fell Ozell, MD  ketoconazole (NIZORAL) 2 % cream Apply 1 application  topically daily as needed (itching toes, nose and eye brow).   Yes [provider]  metoprolol  succinate (TOPROL -XL) 100 MG 24 hr tablet TAKE 1 TABLET (100 MG TOTAL) BY MOUTH DAILY. TAKE WITH OR IMMEDIATELY FOLLOWING A MEAL. 04/08/24  Yes Fell Ozell, MD  nitroGLYCERIN  (NITROSTAT ) 0.4 MG SL tablet Place 1 tablet (0.4 mg total) under the tongue every 5 (five) minutes as needed for chest pain. 12/16/23  Yes Fell Ozell, MD  tamsulosin  (FLOMAX ) 0.4 MG CAPS Take 0.4 mg by mouth daily. 09/25/12  Yes [provider]  Aflibercept  (EYLEA ) 2 MG/0.05ML SOLN by Intravitreal route See admin instructions. Every other  month    [provider]  ranolazine  (RANEXA ) 500 MG 12 hr tablet Take 1 tablet (500 mg total) by mouth 2 (two) times daily. 05/06/24   [name], MD    Past Medical History: Past Medical History:  Diagnosis Date   Arthritis    some in my back (04/14/2017)   CAD (coronary artery disease)    a.  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; lupus attacked them (04/14/2017)   Mitral insufficiency    Moderate mitral regurgitation 10/02/2021   Echocardiogram 6/23: EF 45, global HK, mild ASH, mildly reduced RVSF, normal PASP, RVSP 31, mod LAD, mild RAE, mod MR, trivial AI, AV sclerosis w/o stenosis   Myocardial infarction (HCC)    I've had some mild one (04/14/2017)   Presence of permanent cardiac pacemaker    Skin cancer    nose   VT (ventricular tachycardia)-nonsustained 05/20/2011    Past Surgical History: Past Surgical History:  Procedure Laterality Date   ACHILLES TENDON REPAIR Right    BIV UPGRADE N/A 04/23/2022   Procedure: BIV PPM UPGRADE;  Surgeon: Fernande Elspeth BROCKS, MD;  Location: [facility] INVASIVE CV LAB;  Service: Cardiovascular;  Laterality: N/A;   CARDIAC CATHETERIZATION     CARDIAC CATHETERIZATION N/A 11/03/2014   Procedure: Left Heart Cath and Cors/Grafts Angiography;  Surgeon: Sharper Wonda, MD;  Location: [facility] INVASIVE CV LAB;  Service: Cardiovascular;  Laterality: N/A;   CARDIAC CATHETERIZATION N/A 11/03/2014   Procedure: Coronary Stent Intervention;  Surgeon: Sharper Wonda, MD;  Location: [facility] INVASIVE CV LAB;  Service: Cardiovascular;  Laterality: N/A;   CARDIOVERSION N/A 07/17/2021   Procedure: CARDIOVERSION;  Surgeon: Alvan Ronal BRAVO, MD;  Location: [facility] ENDOSCOPY;  Service: Cardiovascular;  Laterality: N/A;   CARDIOVERSION N/A 10/08/2021   Procedure: CARDIOVERSION;  Surgeon: Hobart Powell BRAVO, MD;  Location: [facility] ENDOSCOPY;  Service: Cardiovascular;  Laterality: N/A;   CARDIOVERSION N/A 12/22/2023   Procedure: CARDIOVERSION;  Surgeon: Mona Vinie BROCKS, MD;  Location: [facility] INVASIVE CV LAB;  Service: Cardiovascular;  Laterality: N/A;   CAROTID ENDARTERECTOMY  2002   ? side   CATARACT EXTRACTION W/ INTRAOCULAR LENS IMPLANT     ? side   CORONARY ANGIOPLASTY WITH STENT PLACEMENT  04/14/2017   CORONARY ANGIOPLASTY WITH  STENT PLACEMENT  11/03/2014   SVG PVA   CORONARY ARTERY BYPASS GRAFT     with redo cabg   CORONARY BALLOON ANGIOPLASTY N/A 08/15/2021   Procedure: CORONARY BALLOON ANGIOPLASTY;  Surgeon: Verlin Lonni BIRCH, MD;  Location: [facility] INVASIVE CV LAB;  Service: Cardiovascular;  Laterality: N/A;   CORONARY STENT INTERVENTION N/A 10/18/2019   Procedure: CORONARY STENT INTERVENTION;  Surgeon: [name], MD;  Location: [facility] INVASIVE CV LAB;  Service: Cardiovascular;  Laterality: N/A;   CORONARY/GRAFT ANGIOGRAPHY N/A 10/18/2019   Procedure: CORONARY/GRAFT ANGIOGRAPHY;  Surgeon: [name], MD;  Location: [facility] INVASIVE CV LAB;  Service: Cardiovascular;  Laterality: N/A;   EP IMPLANTABLE DEVICE N/A 09/13/2014   Procedure: PPM Generator Changeout;  Surgeon: Elspeth BROCKS Fernande, MD;  Location: [facility] INVASIVE CV LAB;  Service: Cardiovascular;  Laterality: N/A;   LEFT HEART CATH AND CORS/GRAFTS ANGIOGRAPHY N/A 04/14/2017  Procedure: LEFT HEART CATH AND CORS/GRAFTS ANGIOGRAPHY;  Surgeon: [name], MD;  Location: [facility] INVASIVE CV LAB;  Service: Cardiovascular;  Laterality: N/A;   LEFT HEART CATH AND CORS/GRAFTS ANGIOGRAPHY N/A 08/15/2021   Procedure: LEFT HEART CATH AND CORS/GRAFTS ANGIOGRAPHY;  Surgeon: Verlin Lonni BIRCH, MD;  Location: [facility] INVASIVE CV LAB;  Service: Cardiovascular;  Laterality: N/A;   LEFT HEART CATH AND CORS/GRAFTS ANGIOGRAPHY N/A 05/21/2022   Procedure: LEFT HEART CATH AND CORS/GRAFTS ANGIOGRAPHY;  Surgeon: [name], MD;  Location: [facility] INVASIVE CV LAB;  Service: Cardiovascular;  Laterality: N/A;   MOHS SURGERY     just outside my nose   PACEMAKER INSERTION  2007   TONSILLECTOMY  1958    Family History:  Family History  Problem Relation Age of Onset   Heart attack Father        87s   Heart disease Father        Heart Disease before age 45   Cancer Mother     Social History: Social History   Socioeconomic History   Marital status: Married    Spouse name: Not on  file   Number of children: Not on file   Years of education: Not on file   Highest education level: Not on file  Occupational History   Not on file  Tobacco Use   Smoking status: Never    Passive exposure: Yes   Smokeless tobacco: Never   Tobacco comments:    Never smoke 10/21/21  Vaping Use   Vaping status: Never Used  Substance and Sexual Activity   Alcohol  use: Yes    Alcohol /week: 3.0 standard drinks of alcohol     Types: 1 Glasses of wine, 1 Cans of beer, 1 Standard drinks or equivalent per week    Comment: 2-3 drinks per week 10/21/21   Drug use: No   Sexual activity: Not Currently  Other Topics Concern   Not on file  Social History Narrative   Not on file   Social Drivers of Health   Tobacco Use: Medium Risk (04/19/2024)   Patient History    Smoking Tobacco Use: Never    Smokeless Tobacco Use: Never    Passive Exposure: Yes  Financial Resource Strain: Not on file  Food Insecurity: No Food Insecurity (05/22/2022)   Hunger Vital Sign    Worried About Running Out of Food in the Last Year: Never true    Ran Out of Food in the Last Year: Never true  Transportation Needs: No Transportation Needs (05/22/2022)   PRAPARE - Administrator, Civil Service (Medical): No    Lack of Transportation (Non-Medical): No  Physical Activity: Not on file  Stress: Not on file  Social Connections: Not on file  Depression (EYV7-0): Medium Risk (03/27/2022)   Depression (PHQ2-9)    PHQ-2 Score: 10  Alcohol  Screen: Not on file  Housing: Low Risk (05/22/2022)   Housing    Last Housing Risk Score: 0  Utilities: Not At Risk (05/22/2022)   AHC Utilities    Threatened with loss of utilities: No  Health Literacy: Not on file    Allergies:  Allergies[1]  Objective:    Vital Signs:   Temp:  [97.9 F (36.6 C)] 97.9 F (36.6 C) (01/09 0614) Pulse Rate:  [68] 68 (01/09 0614) BP: (133)/(73) 133/73 (01/09 0614) SpO2:  [88 %-92 %] 88 % (01/09 1111) Weight:  [73.9 kg] 73.9 kg  (01/09 0614)   Filed Weights   05/06/24 0614  Weight: 73.9  kg    Physical Exam    General:  Elderly Cor: JVP to jaw. Regular rate & rhythm. 3/6 AS murmur Lungs: breathing nonlabored Extremities: no edema   Telemetry in cath lab   AV dual paced  Labs  Basic Metabolic Panel: No results for input(s): NA, K, CL, CO2, GLUCOSE, BUN, CREATININE, CALCIUM , MG, PHOS in the last 168 hours.  Liver Function Tests: No results for input(s): AST, ALT, ALKPHOS, BILITOT, PROT, ALBUMIN in the last 168 hours. No results for input(s): LIPASE, AMYLASE in the last 168 hours. No results for input(s): AMMONIA in the last 168 hours.  CBC: No results for input(s): WBC, NEUTROABS, HGB, HCT, MCV, PLT in the last 168 hours.  Cardiac Enzymes: No results for input(s): CKTOTAL, CKMB, CKMBINDEX, TROPONINI in the last 168 hours.  BNP: BNP (last 3 results) No results for input(s): BNP in the last 8760 hours.  ProBNP (last 3 results) No results for input(s): PROBNP in the last 8760 hours.   CBG: No results for input(s): GLUCAP in the last 168 hours.  Coagulation Studies: No results for input(s): LABPROT, INR in the last 72 hours.  Imaging: No results found.  Patient Profile   Kyle Avila is a 79 y.o. male with history of CAD w/ prior CABG and redo CABG followed by vein graft PCIs, chronic systolic heart failure/ICM, CHB s/p PPM >> CRT-D upgrade, PAF, AS, CKD IV.  Admitted from the cath lab with acute on chronic systolic heart failure.  Assessment/Plan   Chronic systolic heart failure/Ischemic cardiomyopathy  - CABG then redo CABG and multiple PCIs - CRT-D upgrade 2023 for high burden of RV pacing - EF has been in 40-45% range -Last echo 12/25: EF 40-45% (may be closer to 30-35% on Dr. Zenaida review), RWMA, RV moderately reduced, RVSP 58, moderate low flow low gradient AS with AVA 0.88 cm2 by VT and mean gradient 17  mmHg -RHC 05/05/24: RA mean 16, PA 70/21 (39), PCWP mean 27, Fick CO/CI 3.7/2, TD CO/CI 3.06/1.6. - NYHA IV. Volume overloaded. Start DBA at 5 mc/gk/min - Place PICC for CVP and co-ox monitoring - Start IV lasix  80 BID - GDMT will be limited by CKD - Continue home hydralazine  25 TID + imdur  120 - Continue Jardiance  10 mg daily - Reduce metoprolol  succinate to 25 mg daily - Not a candidate for advanced therapies   2. CAD - s/p CABG in 1988 w/ redo in 1996 - multiple PCIs (DES x 2 to LCx in 2009, DES to S-PDA in 2016, DES x 2 to S-PDA in 2018, DES TO RPDA in 6/21, POBA to the PDA in 4/23)  - LHC 05/05/24: severe native disease, patent LIMA to LAD with moderate insertion site stenosis, patent SVG to d RCA including stented segment - On plavix  75 mg - Statin inolerant, on repatha  - Continue ranexa  + imdur   3. Aortic valve stenosis - Reviewed most recent echo and today's cath with Dr. Wonda. AS is moderate at most, likely mild to moderate.  3. CKD IV - Baseline Scr mid 2s, recently closer to 3 - Check labs today   4. PAF - Most recent cardioversion in 8/25 - AV paced today - Continue Eliquis  - Note history of amiodarone -induced lung toxicity   5. Carotid artery disease - s/p CEA   6. CHB  - S/p PPM > upgrade to CRTD in 2023  [facility], Kharter Sestak N, PA-C 05/06/2024, 11:58 AM  Advanced Heart Failure Team Pager [phone] (M-F; 7a - 5p)  Please visit [email or web address]: For overnight coverage please call cardiology fellow first. If fellow not available call Shock/ECMO MD on call.  For ECMO / Mechanical Support (Impella, IABP, LVAD) issues call Shock / ECMO MD on call.      [1]  Allergies Allergen Reactions   Amiodarone  Other (See Comments)    Lung toxicity    "

## 2024-05-07 ENCOUNTER — Encounter (HOSPITAL_COMMUNITY): Payer: Self-pay | Admitting: Cardiovascular Disease

## 2024-05-07 LAB — BASIC METABOLIC PANEL WITH GFR
Anion gap: 12 (ref 5–15)
BUN: 63 mg/dL — ABNORMAL HIGH (ref 8–23)
CO2: 23 mmol/L (ref 22–32)
Calcium: 9 mg/dL (ref 8.9–10.3)
Chloride: 104 mmol/L (ref 98–111)
Creatinine, Ser: 2.87 mg/dL — ABNORMAL HIGH (ref 0.61–1.24)
GFR, Estimated: 22 mL/min — ABNORMAL LOW
Glucose, Bld: 86 mg/dL (ref 70–99)
Potassium: 4.1 mmol/L (ref 3.5–5.1)
Sodium: 138 mmol/L (ref 135–145)

## 2024-05-07 LAB — COOXEMETRY PANEL
Carboxyhemoglobin: 1.7 % — ABNORMAL HIGH (ref 0.5–1.5)
Methemoglobin: <0.7 % (ref 0.0–1.5)
O2 Saturation: 61.1 %
Total hemoglobin: 11.2 g/dL — ABNORMAL LOW (ref 12.0–16.0)

## 2024-05-07 MED ORDER — SODIUM CHLORIDE 0.9% FLUSH
10.0000 mL | Freq: Two times a day (BID) | INTRAVENOUS | Status: DC
  Administered 2024-05-07: 30 mL
  Administered 2024-05-07 – 2024-05-10 (×6): 10 mL
  Administered 2024-05-10: 40 mL
  Administered 2024-05-11: 10 mL

## 2024-05-07 MED ORDER — SODIUM CHLORIDE 0.9% FLUSH: 10.0000 mL | INTRAVENOUS | Status: DC | PRN

## 2024-05-07 MED ORDER — CHLORHEXIDINE GLUCONATE CLOTH 2 % EX PADS
6.0000 | MEDICATED_PAD | Freq: Every day | CUTANEOUS | Status: DC
  Administered 2024-05-07 – 2024-05-11 (×5): 6 via TOPICAL

## 2024-05-07 MED ORDER — HYDRALAZINE HCL 25 MG PO TABS
37.5000 mg | ORAL_TABLET | Freq: Two times a day (BID) | ORAL | Status: DC
  Administered 2024-05-07: 37.5 mg via ORAL
  Filled 2024-05-07: qty 2

## 2024-05-07 NOTE — Plan of Care (Signed)
 " Problem: Education: Goal: Understanding of CV disease, CV risk reduction, and recovery process will improve 05/07/2024 0411 by Mylo Laurey RAMAN, RN Outcome: Progressing 05/07/2024 0347 by Mylo Laurey RAMAN, RN Outcome: Progressing Goal: Individualized Educational Video(s) 05/07/2024 0411 by Mylo Laurey RAMAN, RN Outcome: Progressing 05/07/2024 0347 by Mylo Laurey RAMAN, RN Outcome: Progressing   Problem: Activity: Goal: Ability to return to baseline activity level will improve 05/07/2024 0411 by Mylo Laurey RAMAN, RN Outcome: Progressing 05/07/2024 0347 by Mylo Laurey RAMAN, RN Outcome: Progressing   Problem: Cardiovascular: Goal: Ability to achieve and maintain adequate cardiovascular perfusion will improve 05/07/2024 0411 by Mylo Laurey RAMAN, RN Outcome: Progressing 05/07/2024 0347 by Mylo Laurey RAMAN, RN Outcome: Progressing Goal: Vascular access site(s) Level 0-1 will be maintained 05/07/2024 0411 by Mylo Laurey RAMAN, RN Outcome: Progressing 05/07/2024 0347 by Mylo Laurey RAMAN, RN Outcome: Progressing   Problem: Health Behavior/Discharge Planning: Goal: Ability to safely manage health-related needs after discharge will improve 05/07/2024 0411 by Mylo Laurey RAMAN, RN Outcome: Progressing 05/07/2024 0347 by Mylo Laurey RAMAN, RN Outcome: Progressing   Problem: Education: Goal: Knowledge of General Education information will improve Description: Including pain rating scale, medication(s)/side effects and non-pharmacologic comfort measures 05/07/2024 0411 by Mylo Laurey RAMAN, RN Outcome: Progressing 05/07/2024 0347 by Mylo Laurey RAMAN, RN Outcome: Progressing   Problem: Health Behavior/Discharge Planning: Goal: Ability to manage health-related needs will improve 05/07/2024 0411 by Mylo Laurey RAMAN, RN Outcome: Progressing 05/07/2024 0347 by Mylo Laurey RAMAN, RN Outcome: Progressing   Problem: Clinical Measurements: Goal: Ability to maintain clinical  measurements within normal limits will improve 05/07/2024 0411 by Mylo Laurey RAMAN, RN Outcome: Progressing 05/07/2024 0347 by Mylo Laurey RAMAN, RN Outcome: Progressing Goal: Will remain free from infection 05/07/2024 0411 by Mylo Laurey RAMAN, RN Outcome: Progressing 05/07/2024 0347 by Mylo Laurey RAMAN, RN Outcome: Progressing Goal: Diagnostic test results will improve 05/07/2024 0411 by Mylo Laurey RAMAN, RN Outcome: Progressing 05/07/2024 0347 by Mylo Laurey RAMAN, RN Outcome: Progressing Goal: Respiratory complications will improve 05/07/2024 0411 by Mylo Laurey RAMAN, RN Outcome: Progressing 05/07/2024 0347 by Mylo Laurey RAMAN, RN Outcome: Progressing Goal: Cardiovascular complication will be avoided 05/07/2024 0411 by Mylo Laurey RAMAN, RN Outcome: Progressing 05/07/2024 0347 by Mylo Laurey RAMAN, RN Outcome: Progressing   Problem: Activity: Goal: Risk for activity intolerance will decrease 05/07/2024 0411 by Mylo Laurey RAMAN, RN Outcome: Progressing 05/07/2024 0347 by Mylo Laurey RAMAN, RN Outcome: Progressing   Problem: Nutrition: Goal: Adequate nutrition will be maintained 05/07/2024 0411 by Mylo Laurey RAMAN, RN Outcome: Progressing 05/07/2024 0347 by Mylo Laurey RAMAN, RN Outcome: Progressing   Problem: Coping: Goal: Level of anxiety will decrease 05/07/2024 0411 by Mylo Laurey RAMAN, RN Outcome: Progressing 05/07/2024 0347 by Mylo Laurey RAMAN, RN Outcome: Progressing   Problem: Elimination: Goal: Will not experience complications related to bowel motility 05/07/2024 0411 by Mylo Laurey RAMAN, RN Outcome: Progressing 05/07/2024 0347 by Mylo Laurey RAMAN, RN Outcome: Progressing Goal: Will not experience complications related to urinary retention 05/07/2024 0411 by Mylo Laurey RAMAN, RN Outcome: Progressing 05/07/2024 0347 by Mylo Laurey RAMAN, RN Outcome: Progressing   Problem: Pain Managment: Goal: General experience of comfort will  improve and/or be controlled 05/07/2024 0411 by Mylo Laurey RAMAN, RN Outcome: Progressing 05/07/2024 0347 by Mylo Laurey RAMAN, RN Outcome: Progressing   Problem: Safety: Goal: Ability to remain free from injury will improve 05/07/2024 0411 by Mylo Laurey RAMAN, RN Outcome: Progressing 05/07/2024 0347 by Mylo Laurey RAMAN, RN Outcome: Progressing   Problem: Skin Integrity: Goal:  Risk for impaired skin integrity will decrease 05/07/2024 0411 by Mylo Laurey RAMAN, RN Outcome: Progressing 05/07/2024 0347 by Mylo Laurey RAMAN, RN Outcome: Progressing   "

## 2024-05-07 NOTE — Progress Notes (Signed)
 Patient ID: Kyle Avila, male   DOB: Sep 10, 1945, 79 y.o.   MRN: [id number]     Advanced Heart Failure Rounding Note  Cardiologist: [name], MD  Chief Complaint: CHF     I/Os net negative 2870 cc, weight down.  Creatinine 2.96 => 2.87.  He is on dobutamine  5 mcg/kg/min.  PICC line just placed.   Denies dyspnea at rest.    Objective:   Weight Range: 73.2 kg Body mass index is 24.53 kg/m.   Vital Signs:   Temp:  [97.7 F (36.5 C)-98.7 F (37.1 C)] 97.8 F (36.6 C) (01/10 0846) Pulse Rate:  [61-68] 67 (01/10 0846) Resp:  [16-25] 18 (01/10 0846) BP: (128-138)/(58-70) 136/61 (01/10 0846) SpO2:  [88 %-100 %] 94 % (01/10 0846) Weight:  [73.2 kg-78.7 kg] 73.2 kg (01/10 0415) Last BM Date : 05/06/24  Weight change: Filed Weights   05/06/24 0614 05/06/24 1508 05/07/24 0415  Weight: 73.9 kg 78.7 kg 73.2 kg    Intake/Output:   Intake/Output Summary (Last 24 hours) at 05/07/2024 1120 Last data filed at 05/07/2024 0900 Gross per 24 hour  Intake 740.36 ml  Output 3250 ml  Net -2509.64 ml     Physical Exam   General: NAD Neck: JVP 12-14 cm, no thyromegaly or thyroid  nodule.  Lungs: Clear to auscultation bilaterally with normal respiratory effort. CV: Nondisplaced PMI.  Heart regular S1/S2, no S3/S4, 3/6 SEM RUSB.  No peripheral edema.   Abdomen: Soft, nontender, no hepatosplenomegaly, no distention.  Skin: Intact without lesions or rashes.  Neurologic: Alert and oriented x 3.  Psych: Normal affect. Extremities: No clubbing or cyanosis.  HEENT: Normal.   Telemetry   A-BiV pacing (personally reviewed)  Labs   CBC Recent Labs    05/06/24 2002  WBC 4.5  HGB 12.0*  HCT 36.5*  MCV 109.0*  PLT 137*   Basic Metabolic Panel Recent Labs    98/90/73 2002 05/07/24 0410  NA 139 138  K 4.8 4.1  CL 105 104  CO2 22 23  GLUCOSE 105* 86  BUN 64* 63*  CREATININE 2.96* 2.87*  CALCIUM  9.1 9.0  MG 2.6*  --    Liver Function Tests Recent Labs     05/06/24 2002  AST 25  ALT 22  ALKPHOS 107  BILITOT 0.5  PROT 6.4*  ALBUMIN 3.7   No results for input(s): LIPASE, AMYLASE in the last 72 hours. Cardiac Enzymes No results for input(s): CKTOTAL, CKMB, CKMBINDEX, TROPONINI in the last 72 hours.  BNP: BNP (last 3 results) No results for input(s): BNP in the last 8760 hours.  ProBNP (last 3 results) No results for input(s): PROBNP in the last 8760 hours.   D-Dimer No results for input(s): DDIMER in the last 72 hours. Hemoglobin A1C No results for input(s): HGBA1C in the last 72 hours. Fasting Lipid Panel No results for input(s): CHOL, HDL, LDLCALC, TRIG, CHOLHDL, LDLDIRECT in the last 72 hours. Medications:   Scheduled Medications:  allopurinol   100 mg Oral QHS   apixaban   5 mg Oral BID   clopidogrel   75 mg Oral Daily   empagliflozin   10 mg Oral QAC breakfast   hydrALAZINE   37.5 mg Oral BID   isosorbide  mononitrate  120 mg Oral Daily   loratadine   10 mg Oral Daily   metoprolol  succinate  25 mg Oral Daily   ranolazine   500 mg Oral BID   sodium chloride  flush  3 mL Intravenous Q12H   tamsulosin   0.4 mg Oral  Daily    Infusions:  sodium chloride      DOBUTamine      furosemide  160 mg (05/06/24 2049)    PRN Medications: sodium chloride , acetaminophen , nitroGLYCERIN , ondansetron  (ZOFRAN ) IV, sodium chloride  flush  Assessment/Plan   1. CAD: S/p CABG followed by redo CABG.  Coronary angiography 1/9 with no target for PCI.  Patent LIMA-LAD and SVG-PDA.  - Continue Plavix , has been on this addition to apixaban  at home.  - Statin intolerant, takes Repatha .  - Continue ranolazine  500 bid.  2. Acute on chronic systolic CHF: Ischemic cardiomyopathy.  Has CRT-D device.  Echo in 12/25 was reviewed, EF appears to be 30-35% with moderate RV dysfunction and probably moderate aortic stenosis, IVC dilated.  RHC this admission with elevated right and left heart filling pressures, CI 2 Fick/1.6 thermo.   Patient was admitted for diuresis and started on dobutamine  5.  He diuresed well overnight, weight down.  Still volume overloaded by JVD. Creatinine lower at 2.87.  - Continue dobutamine  5 mcg/kg/min while diuresing, then try to wean off.  - Continue current Lasix  160 mg IV bid today.   - PICC placed this morning, will set up CVP and follow co-ox.  - Increase hydralazine  to 37.5 bid and continue Imdur .   - Continue Jardiance  10 daily - Continue Toprol  XL at lower dose 25 mg daily.  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   - He is not a candidate for advanced therapies with age and CKD stage IV.  3. Atrial fibrillation: Paroxysmal.  A-paced currently.  - Continue apixaban .  4. CKD stage IV: Creatinine stable today at 2.87.   - Continue diuresis guided by CVP.  5. Aortic stenosis: Suspect no more than moderate based on recent echo and cath.  6. Carotid stenosis: H/o CEA.  7. H/o complete heart block: Has CRT-D device.   Length of Stay: 1  Ezra Shuck, MD  05/07/2024, 11:20 AM  Advanced Heart Failure Team Pager [phone] (M-F; 7a - 5p)   Please visit [email or web address]: For overnight coverage please call cardiology fellow first. If fellow not available call Shock/ECMO MD on call.  For ECMO / Mechanical Support (Impella, IABP, LVAD) issues call Shock / ECMO MD on call.

## 2024-05-07 NOTE — Progress Notes (Signed)
 Peripherally Inserted Central Catheter Placement  The IV Nurse has discussed with the patient and/or persons authorized to consent for the patient, the purpose of this procedure and the potential benefits and risks involved with this procedure.  The benefits include less needle sticks, lab draws from the catheter, and the patient may be discharged home with the catheter. Risks include, but not limited to, infection, bleeding, blood clot (thrombus formation), and puncture of an artery; nerve damage and irregular heartbeat and possibility to perform a PICC exchange if needed/ordered by physician.  Alternatives to this procedure were also discussed.  Bard Power PICC patient education guide, fact sheet on infection prevention and patient information card has been provided to patient /or left at bedside.    PICC Placement Documentation  PICC Triple Lumen 05/07/24 Left Brachial 40 cm 0 cm (Active)  Indication for Insertion or Continuance of Line Chronic illness with exacerbations (CF, Sickle Cell, etc.) 05/07/24 1045  Exposed Catheter (cm) 0 cm 05/07/24 1045  Site Assessment Clean, Dry, Intact 05/07/24 1045  Lumen #1 Status Flushed;Saline locked;Blood return noted 05/07/24 1045  Lumen #2 Status Flushed;Saline locked;Blood return noted 05/07/24 1045  Lumen #3 Status Flushed;Saline locked;Blood return noted 05/07/24 1045  Dressing Type Transparent;Securing device 05/07/24 1045  Dressing Status Antimicrobial disc/dressing in place;Clean, Dry, Intact 05/07/24 1045  Line Care Connections checked and tightened 05/07/24 1045  Line Adjustment (NICU/IV Team Only) No 05/07/24 1045  Dressing Intervention New dressing;Adhesive placed at insertion site (IV team only) 05/07/24 1045  Dressing Change Due 05/14/24 05/07/24 1045       Kylei Purington Sheral Ruth 05/07/2024, 11:44 AM

## 2024-05-07 NOTE — Plan of Care (Signed)

## 2024-05-08 LAB — BASIC METABOLIC PANEL WITH GFR
Anion gap: 15 (ref 5–15)
BUN: 65 mg/dL — ABNORMAL HIGH (ref 8–23)
CO2: 21 mmol/L — ABNORMAL LOW (ref 22–32)
Calcium: 8.8 mg/dL — ABNORMAL LOW (ref 8.9–10.3)
Chloride: 102 mmol/L (ref 98–111)
Creatinine, Ser: 3.35 mg/dL — ABNORMAL HIGH (ref 0.61–1.24)
GFR, Estimated: 18 mL/min — ABNORMAL LOW
Glucose, Bld: 94 mg/dL (ref 70–99)
Potassium: 4 mmol/L (ref 3.5–5.1)
Sodium: 137 mmol/L (ref 135–145)

## 2024-05-08 LAB — COOXEMETRY PANEL
Carboxyhemoglobin: 2.7 % — ABNORMAL HIGH (ref 0.5–1.5)
Methemoglobin: <0.7 % (ref 0.0–1.5)
O2 Saturation: 74.1 %
Total hemoglobin: 11.5 g/dL — ABNORMAL LOW (ref 12.0–16.0)

## 2024-05-08 MED ORDER — GUAIFENESIN-DM 100-10 MG/5ML PO SYRP
15.0000 mL | ORAL_SOLUTION | ORAL | Status: DC | PRN
  Administered 2024-05-08 – 2024-05-10 (×5): 15 mL via ORAL
  Filled 2024-05-08 (×5): qty 15

## 2024-05-08 MED ORDER — HYDRALAZINE HCL 50 MG PO TABS
50.0000 mg | ORAL_TABLET | Freq: Two times a day (BID) | ORAL | Status: DC
  Administered 2024-05-08 – 2024-05-09 (×3): 50 mg via ORAL
  Filled 2024-05-08 (×3): qty 1

## 2024-05-08 MED ORDER — ALTEPLASE 2 MG IJ SOLR
2.0000 mg | Freq: Once | INTRAMUSCULAR | Status: AC
  Administered 2024-05-08: 2 mg
  Filled 2024-05-08: qty 2

## 2024-05-08 NOTE — Plan of Care (Signed)
  Problem: Education: Goal: Understanding of CV disease, CV risk reduction, and recovery process will improve Outcome: Progressing Goal: Individualized Educational Video(s) Outcome: Progressing   Problem: Activity: Goal: Ability to return to baseline activity level will improve Outcome: Progressing   Problem: Cardiovascular: Goal: Ability to achieve and maintain adequate cardiovascular perfusion will improve Outcome: Progressing Goal: Vascular access site(s) Level 0-1 will be maintained Outcome: Progressing   Problem: Health Behavior/Discharge Planning: Goal: Ability to safely manage health-related needs after discharge will improve Outcome: Progressing   Problem: Education: Goal: Knowledge of General Education information will improve Description: Including pain rating scale, medication(s)/side effects and non-pharmacologic comfort measures Outcome: Progressing   Problem: Health Behavior/Discharge Planning: Goal: Ability to manage health-related needs will improve Outcome: Progressing   Problem: Clinical Measurements: Goal: Ability to maintain clinical measurements within normal limits will improve Outcome: Progressing Goal: Will remain free from infection Outcome: Progressing Goal: Diagnostic test results will improve Outcome: Progressing Goal: Respiratory complications will improve Outcome: Progressing Goal: Cardiovascular complication will be avoided Outcome: Progressing   Problem: Activity: Goal: Risk for activity intolerance will decrease Outcome: Progressing   Problem: Nutrition: Goal: Adequate nutrition will be maintained Outcome: Progressing   Problem: Coping: Goal: Level of anxiety will decrease Outcome: Progressing   Problem: Elimination: Goal: Will not experience complications related to bowel motility Outcome: Progressing Goal: Will not experience complications related to urinary retention Outcome: Progressing   Problem: Pain Managment: Goal:  General experience of comfort will improve and/or be controlled Outcome: Progressing   Problem: Safety: Goal: Ability to remain free from injury will improve Outcome: Progressing   Problem: Skin Integrity: Goal: Risk for impaired skin integrity will decrease Outcome: Progressing   Problem: Education: Goal: Ability to demonstrate management of disease process will improve Outcome: Progressing Goal: Ability to verbalize understanding of medication therapies will improve Outcome: Progressing Goal: Individualized Educational Video(s) Outcome: Progressing   Problem: Activity: Goal: Capacity to carry out activities will improve Outcome: Progressing   Problem: Cardiac: Goal: Ability to achieve and maintain adequate cardiopulmonary perfusion will improve Outcome: Progressing

## 2024-05-08 NOTE — Progress Notes (Signed)
 Patient ID: Kyle Avila, male   DOB: [birth year], 79 y.o.   MRN: [id number] P    Advanced Heart Failure Rounding Note  Cardiologist: [name], MD  Chief Complaint: CHF     I/Os net negative 2512 cc.  Creatinine 2.96 => 2.87 => 3.35.  He is on dobutamine  5 mcg/kg/min.  Co-ox 74%, CVP 8-9 this morning.   Denies dyspnea at rest.    Objective:   Weight Range: 73.7 kg Body mass index is 24.71 kg/m.   Vital Signs:   Temp:  [97.9 F (36.6 C)-98.9 F (37.2 C)] 98 F (36.7 C) (01/11 0841) Pulse Rate:  [64-75] 74 (01/11 0841) Resp:  [16-18] 18 (01/11 0841) BP: (123-155)/(55-67) 134/61 (01/11 0841) SpO2:  [93 %-96 %] 96 % (01/11 0841) Weight:  [73.7 kg] 73.7 kg (01/11 0427) Last BM Date : 05/07/24  Weight change: Filed Weights   05/06/24 1508 05/07/24 0415 05/08/24 0427  Weight: 78.7 kg 73.2 kg 73.7 kg    Intake/Output:   Intake/Output Summary (Last 24 hours) at 05/08/2024 0942 Last data filed at 05/08/2024 0500 Gross per 24 hour  Intake 602.8 ml  Output 3275 ml  Net -2672.2 ml     Physical Exam   General: NAD Neck: JVP 8 cm, no thyromegaly or thyroid  nodule.  Lungs: Clear to auscultation bilaterally with normal respiratory effort. CV: Nondisplaced PMI.  Heart regular S1/S2, no S3/S4, 3/6 SEM RUSB.  1+ ankle edema.  Abdomen: Soft, nontender, no hepatosplenomegaly, no distention.  Skin: Intact without lesions or rashes.  Neurologic: Alert and oriented x 3.  Psych: Normal affect. Extremities: No clubbing or cyanosis.  HEENT: Normal.   Telemetry   A-BiV pacing (personally reviewed)  Labs   CBC Recent Labs    05/06/24 2002  WBC 4.5  HGB 12.0*  HCT 36.5*  MCV 109.0*  PLT 137*   Basic Metabolic Panel Recent Labs    98/90/73 2002 05/07/24 0410 05/08/24 0438  NA 139 138 137  K 4.8 4.1 4.0  CL 105 104 102  CO2 22 23 21*  GLUCOSE 105* 86 94  BUN 64* 63* 65*  CREATININE 2.96* 2.87* 3.35*  CALCIUM  9.1 9.0 8.8*  MG 2.6*  --   --    Liver  Function Tests Recent Labs    05/06/24 2002  AST 25  ALT 22  ALKPHOS 107  BILITOT 0.5  PROT 6.4*  ALBUMIN 3.7   No results for input(s): LIPASE, AMYLASE in the last 72 hours. Cardiac Enzymes No results for input(s): CKTOTAL, CKMB, CKMBINDEX, TROPONINI in the last 72 hours.  BNP: BNP (last 3 results) No results for input(s): BNP in the last 8760 hours.  ProBNP (last 3 results) No results for input(s): PROBNP in the last 8760 hours.   D-Dimer No results for input(s): DDIMER in the last 72 hours. Hemoglobin A1C No results for input(s): HGBA1C in the last 72 hours. Fasting Lipid Panel No results for input(s): CHOL, HDL, LDLCALC, TRIG, CHOLHDL, LDLDIRECT in the last 72 hours. Medications:   Scheduled Medications:  allopurinol   100 mg Oral QHS   apixaban   5 mg Oral BID   Chlorhexidine  Gluconate Cloth  6 each Topical Daily   clopidogrel   75 mg Oral Daily   empagliflozin   10 mg Oral QAC breakfast   hydrALAZINE   50 mg Oral BID   isosorbide  mononitrate  120 mg Oral Daily   loratadine   10 mg Oral Daily   metoprolol  succinate  25 mg Oral Daily  ranolazine   500 mg Oral BID   sodium chloride  flush  10-40 mL Intracatheter Q12H   sodium chloride  flush  3 mL Intravenous Q12H   tamsulosin   0.4 mg Oral Daily    Infusions:  DOBUTamine  5 mcg/kg/min (05/08/24 0458)    PRN Medications: acetaminophen , nitroGLYCERIN , ondansetron  (ZOFRAN ) IV, sodium chloride  flush, sodium chloride  flush  Assessment/Plan   1. CAD: S/p CABG followed by redo CABG.  Coronary angiography 1/9 with no target for PCI.  Patent LIMA-LAD and SVG-PDA.  - Continue Plavix , has been on this addition to apixaban  at home.  - Statin intolerant, takes Repatha .  - Continue ranolazine  500 bid.  2. Acute on chronic systolic CHF: Ischemic cardiomyopathy.  Has CRT-D device.  Echo in 12/25 was reviewed, EF appears to be 30-35% with moderate RV dysfunction and probably moderate aortic  stenosis, IVC dilated.  RHC this admission with elevated right and left heart filling pressures, CI 2 Fick/1.6 thermo.  Patient was admitted for diuresis and started on dobutamine  5.  He diuresed well overnight again.  CVP 8-9, co-ox 74%. Creatinine 2.87 => 3.35.  - Continue dobutamine  5 mcg/kg/min for now.  - He had IV Lasix  this morning, with rise in creatinine will not given any further Lasix  today. Reassess volume status and renal function tomorrow, ?transition to po diuretic.  - Increase hydralazine  to 50 mg bid and continue Imdur .   - Continue Jardiance  10 daily - Continue Toprol  XL at lower dose 25 mg daily.  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   - He is not a candidate for advanced therapies with age and CKD stage IV.  3. Atrial fibrillation: Paroxysmal.  A-paced currently.  - Continue apixaban .  4. AKI on CKD stage IV: Creatinine up 2.87 => 3.35.   - Hold further diuretics today and continue dobutamine  as above.  5. Aortic stenosis: Suspect no more than moderate based on recent echo and cath.  6. Carotid stenosis: H/o CEA.  7. H/o complete heart block: Has CRT-D device.   Length of Stay: 2  Ezra Shuck, MD  05/08/2024, 9:42 AM  Advanced Heart Failure Team Pager [phone] (M-F; 7a - 5p)   Please visit [email or web address]: For overnight coverage please call cardiology fellow first. If fellow not available call Shock/ECMO MD on call.  For ECMO / Mechanical Support (Impella, IABP, LVAD) issues call Shock / ECMO MD on call.

## 2024-05-09 ENCOUNTER — Encounter (HOSPITAL_COMMUNITY): Payer: Self-pay | Admitting: Cardiovascular Disease

## 2024-05-09 LAB — POCT I-STAT 7, (LYTES, BLD GAS, ICA,H+H)
Acid-base deficit: 6 mmol/L — ABNORMAL HIGH (ref 0.0–2.0)
Bicarbonate: 18.3 mmol/L — ABNORMAL LOW (ref 20.0–28.0)
Calcium, Ion: 1.22 mmol/L (ref 1.15–1.40)
HCT: 30 % — ABNORMAL LOW (ref 39.0–52.0)
Hemoglobin: 10.2 g/dL — ABNORMAL LOW (ref 13.0–17.0)
O2 Saturation: 85 %
Potassium: 4.6 mmol/L (ref 3.5–5.1)
Sodium: 140 mmol/L (ref 135–145)
TCO2: 19 mmol/L — ABNORMAL LOW (ref 22–32)
pCO2 arterial: 30 mmHg — ABNORMAL LOW (ref 32–48)
pH, Arterial: 7.395 (ref 7.35–7.45)
pO2, Arterial: 49 mmHg — ABNORMAL LOW (ref 83–108)

## 2024-05-09 LAB — BASIC METABOLIC PANEL WITH GFR
Anion gap: 12 (ref 5–15)
BUN: 63 mg/dL — ABNORMAL HIGH (ref 8–23)
CO2: 25 mmol/L (ref 22–32)
Calcium: 8.5 mg/dL — ABNORMAL LOW (ref 8.9–10.3)
Chloride: 103 mmol/L (ref 98–111)
Creatinine, Ser: 3.3 mg/dL — ABNORMAL HIGH (ref 0.61–1.24)
GFR, Estimated: 18 mL/min — ABNORMAL LOW
Glucose, Bld: 92 mg/dL (ref 70–99)
Potassium: 3.3 mmol/L — ABNORMAL LOW (ref 3.5–5.1)
Sodium: 140 mmol/L (ref 135–145)

## 2024-05-09 LAB — POCT I-STAT EG7
Acid-base deficit: 4 mmol/L — ABNORMAL HIGH (ref 0.0–2.0)
Acid-base deficit: 4 mmol/L — ABNORMAL HIGH (ref 0.0–2.0)
Bicarbonate: 20.9 mmol/L (ref 20.0–28.0)
Bicarbonate: 21 mmol/L (ref 20.0–28.0)
Calcium, Ion: 1.18 mmol/L (ref 1.15–1.40)
Calcium, Ion: 1.22 mmol/L (ref 1.15–1.40)
HCT: 33 % — ABNORMAL LOW (ref 39.0–52.0)
HCT: 33 % — ABNORMAL LOW (ref 39.0–52.0)
Hemoglobin: 11.2 g/dL — ABNORMAL LOW (ref 13.0–17.0)
Hemoglobin: 11.2 g/dL — ABNORMAL LOW (ref 13.0–17.0)
O2 Saturation: 48 %
O2 Saturation: 50 %
Potassium: 4.6 mmol/L (ref 3.5–5.1)
Potassium: 4.7 mmol/L (ref 3.5–5.1)
Sodium: 141 mmol/L (ref 135–145)
Sodium: 141 mmol/L (ref 135–145)
TCO2: 22 mmol/L (ref 22–32)
TCO2: 22 mmol/L (ref 22–32)
pCO2, Ven: 38.5 mmHg — ABNORMAL LOW (ref 44–60)
pCO2, Ven: 39.4 mmHg — ABNORMAL LOW (ref 44–60)
pH, Ven: 7.336 (ref 7.25–7.43)
pH, Ven: 7.344 (ref 7.25–7.43)
pO2, Ven: 27 mmHg — CL (ref 32–45)
pO2, Ven: 28 mmHg — CL (ref 32–45)

## 2024-05-09 LAB — COOXEMETRY PANEL
Carboxyhemoglobin: 2 % — ABNORMAL HIGH (ref 0.5–1.5)
Methemoglobin: <0.7 % (ref 0.0–1.5)
O2 Saturation: 74.3 %
Total hemoglobin: 11.2 g/dL — ABNORMAL LOW (ref 12.0–16.0)

## 2024-05-09 MED ORDER — POTASSIUM CHLORIDE CRYS ER 20 MEQ PO TBCR
40.0000 meq | EXTENDED_RELEASE_TABLET | Freq: Once | ORAL | Status: AC
  Administered 2024-05-09: 40 meq via ORAL
  Filled 2024-05-09: qty 2

## 2024-05-09 MED ORDER — HYDRALAZINE HCL 50 MG PO TABS
50.0000 mg | ORAL_TABLET | Freq: Three times a day (TID) | ORAL | Status: DC
  Administered 2024-05-09 – 2024-05-11 (×5): 50 mg via ORAL
  Filled 2024-05-09 (×5): qty 1

## 2024-05-09 MED ORDER — TORSEMIDE 20 MG PO TABS
60.0000 mg | ORAL_TABLET | Freq: Every day | ORAL | Status: DC
  Administered 2024-05-09 – 2024-05-11 (×3): 60 mg via ORAL
  Filled 2024-05-09 (×3): qty 3

## 2024-05-09 NOTE — TOC Initial Note (Signed)
 Transition of Care [facility]) - Initial/Assessment Note    Patient Details  Name: Kyle Avila MRN: [id number] Date of Birth: 1945/07/19  Transition of Care [facility]) CM/SW Contact:    Arlana JINNY Nicholaus [place] Phone Number: [phone] 05/09/2024, 12:51 PM  Clinical Narrative:     HF CSW met with patient at bedside. Patient stated that he lives alone. Patient stated that he drives. Patient stated that he has no history of HH services. Patient stated that he does not use any equipment. Patient stated that he does not use any equipment. Patient stated that he has a scale at home. Patient stated that he has a PCP. CSW explained that a hospital follow up appointment is typically scheduled closer towards dc. Patient is agreeable.   HF CSW/CM will continue to follow and monitor for dc readiness.               Expected Discharge Plan: Home/Self Care Barriers to Discharge: Continued Medical Work up   Patient Goals and CMS Choice            Expected Discharge Plan and Services       Living arrangements for the past 2 months: Single Family Home                                      Prior Living Arrangements/Services Living arrangements for the past 2 months: Single Family Home Lives with:: Self Patient language and need for interpreter reviewed:: Yes Do you feel safe going back to the place where you live?: Yes      Need for Family Participation in Patient Care: No (Comment) Care giver support system in place?: No (comment)   Criminal Activity/Legal Involvement Pertinent to Current Situation/Hospitalization: No - Comment as needed  Activities of Daily Living   ADL Screening (condition at time of admission) Independently performs ADLs?: Yes (appropriate for developmental age) Is the patient deaf or have difficulty hearing?: No Does the patient have difficulty seeing, even when wearing glasses/contacts?: No Does the patient have difficulty concentrating, remembering, or making  decisions?: No  Permission Sought/Granted Permission sought to share information with : PCP                Emotional Assessment Appearance:: Appears stated age Attitude/Demeanor/Rapport: Engaged Affect (typically observed): Appropriate Orientation: : Oriented to Self, Oriented to Place, Oriented to  Time, Oriented to Situation Alcohol  / Substance Use: Not Applicable Psych Involvement: No (comment)  Admission diagnosis:  Acute on chronic combined systolic and diastolic CHF (congestive heart failure) (HCC) [I50.43] Patient Active Problem List   Diagnosis Date Noted   Acute on chronic combined systolic and diastolic CHF (congestive heart failure) (HCC) 05/06/2024   Moderate aortic stenosis 04/18/2024   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   Moderate mitral regurgitation 10/02/2021   Secondary hypercoagulable state 09/26/2021   Ischemic cardiomyopathy 08/16/2021   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   Complete heart block s/p PPM 08/16/2021   Persistent atrial fibrillation (HCC) 06/24/2021   Amnesia 06/17/2021   Chronic cough 11/21/2019   Angina pectoris 10/18/2019   Gastro-esophageal reflux disease without esophagitis 10/13/2018   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   History of chronic cough 03/25/2018   Benign neoplasm of colon 03/23/2018   CKD (chronic kidney disease) stage 4, GFR 15-29 ml/min (HCC) 03/16/2018   Superficial foreign body of finger without major open wound and without infection 03/10/2018  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   CAD (coronary artery disease) 11/04/2014   PVD - s/p CEA '02 11/04/2014   Exertional angina 11/03/2014   Non-thrombocytopenic purpura 08/09/2014   Aftercare following surgery of the circulatory system, NEC 07/19/2013   Displacement of lumbar 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 myelopathy 06/07/2013   Benign prostatic hyperplasia without lower urinary tract  symptoms 05/17/2012   Occlusion and stenosis of carotid artery without mention of cerebral infarction 06/17/2011   Incomplete heart block 2:1 05/20/2011   VT (ventricular tachycardia)-nonsustained 05/20/2011   Benign paroxysmal positional vertigo 03/03/2011   Hypertension 01/22/2011   Hyperlipidemia 01/22/2011   Hx of CABG '88 and re do '96 05/20/2010   PTVDP '07 with gen change May 2016 05/20/2010   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   Gout 03/01/2009   Hypothyroidism 03/01/2009   Low back pain 03/01/2009   Osteoarthritis 03/01/2009   PCP:  Janey Santos, MD Pharmacy:   [facility] - [place], [place] - [address] [place] [address] Phone: [phone] Fax: [phone]  [facility] [address], [place] - [address] [place] [address] Phone: [phone] Fax: [phone]     Social Drivers of Health (SDOH) Social History: SDOH Screenings   Food Insecurity: No Food Insecurity (05/07/2024)  Housing: Low Risk (05/07/2024)  Transportation Needs: No Transportation Needs (05/07/2024)  Utilities: Not At Risk (05/07/2024)  Depression (PHQ2-9): Medium Risk (03/27/2022)  Social Connections: Socially Isolated (05/07/2024)  Tobacco Use: Medium Risk (05/07/2024)   SDOH Interventions:     Readmission Risk Interventions     No data to display

## 2024-05-09 NOTE — Progress Notes (Addendum)
 Patient ID: Kyle Avila, male   DOB: 22-Mar-1946, 79 y.o.   MRN: [id number] P    Advanced Heart Failure Rounding Note  Cardiologist: [name], MD  Chief Complaint: CHF   Patient Profile   79 y/o male with a long standing history of ischemic cardiomyopathy, CAD s/p prior CABG, redo CABG, and multiple PCIs,who presented for scheduled L/RHC in the setting of worsening shortness of breath with exertion. RHC showed biventricular elevated filling pressures as well as severely reduced cardiac index by TD. LHC no targetd for PCI. Admitted for inotrope assisted diuresis.   Subjective   Remains on DBA 5. Diuretics held yesterday. 1.3L in UOP   Co-ox 74% today. SCr 2.96 => 2.87 => 3.35 =>3.30.  Wt continues to trend down, CVP 6 today. Overall feeling better. No current resting dyspnea. No CP.    Objective:   Weight Range: 71.4 kg Body mass index is 23.93 kg/m.   Vital Signs:   Temp:  [97.7 F (36.5 C)-98 F (36.7 C)] 97.7 F (36.5 C) (01/12 0526) Pulse Rate:  [61-74] 66 (01/12 0526) Resp:  [16-18] 18 (01/12 0526) BP: (119-134)/(55-72) 130/60 (01/12 0526) SpO2:  [96 %-97 %] 96 % (01/11 2034) Weight:  [71.4 kg] 71.4 kg (01/12 0526) Last BM Date : 05/08/24  Weight change: Filed Weights   05/07/24 0415 05/08/24 0427 05/09/24 0526  Weight: 73.2 kg 73.7 kg 71.4 kg    Intake/Output:   Intake/Output Summary (Last 24 hours) at 05/09/2024 0815 Last data filed at 05/09/2024 0754 Gross per 24 hour  Intake 606.45 ml  Output 1650 ml  Net -1043.55 ml     Physical Exam    GENERAL: NAD Lungs- clear  CARDIAC:  JVP 7 cm          Normal rate with regular rhythm. 3/6 AS LSB ABDOMEN: Soft, non-tender, non-distended.  EXTREMITIES: Warm and well perfused. Trace left ankle edema  NEUROLOGIC: No obvious FND   Telemetry   A-BiV pacing (personally reviewed)  Labs   CBC Recent Labs    05/06/24 2002  WBC 4.5  HGB 12.0*  HCT 36.5*  MCV 109.0*  PLT 137*   Basic Metabolic  Panel Recent Labs    05/06/24 2002 05/07/24 0410 05/08/24 0438 05/09/24 0508  NA 139   < > 137 140  K 4.8   < > 4.0 3.3*  CL 105   < > 102 103  CO2 22   < > 21* 25  GLUCOSE 105*   < > 94 92  BUN 64*   < > 65* 63*  CREATININE 2.96*   < > 3.35* 3.30*  CALCIUM  9.1   < > 8.8* 8.5*  MG 2.6*  --   --   --    < > = values in this interval not displayed.   Liver Function Tests Recent Labs    05/06/24 2002  AST 25  ALT 22  ALKPHOS 107  BILITOT 0.5  PROT 6.4*  ALBUMIN 3.7   No results for input(s): LIPASE, AMYLASE in the last 72 hours. Cardiac Enzymes No results for input(s): CKTOTAL, CKMB, CKMBINDEX, TROPONINI in the last 72 hours.  BNP: BNP (last 3 results) No results for input(s): BNP in the last 8760 hours.  ProBNP (last 3 results) No results for input(s): PROBNP in the last 8760 hours.   D-Dimer No results for input(s): DDIMER in the last 72 hours. Hemoglobin A1C No results for input(s): HGBA1C in the last 72 hours. Fasting Lipid Panel  No results for input(s): CHOL, HDL, LDLCALC, TRIG, CHOLHDL, LDLDIRECT in the last 72 hours. Medications:   Scheduled Medications:  allopurinol   100 mg Oral QHS   apixaban   5 mg Oral BID   Chlorhexidine  Gluconate Cloth  6 each Topical Daily   clopidogrel   75 mg Oral Daily   empagliflozin   10 mg Oral QAC breakfast   hydrALAZINE   50 mg Oral BID   isosorbide  mononitrate  120 mg Oral Daily   loratadine   10 mg Oral Daily   metoprolol  succinate  25 mg Oral Daily   ranolazine   500 mg Oral BID   sodium chloride  flush  10-40 mL Intracatheter Q12H   sodium chloride  flush  3 mL Intravenous Q12H   tamsulosin   0.4 mg Oral Daily    Infusions:  DOBUTamine  5 mcg/kg/min (05/09/24 0347)    PRN Medications: acetaminophen , guaiFENesin -dextromethorphan , nitroGLYCERIN , ondansetron  (ZOFRAN ) IV, sodium chloride  flush, sodium chloride  flush  Assessment/Plan   1. CAD: S/p CABG followed by redo CABG.  Coronary  angiography 1/9 with no target for PCI.  Patent LIMA-LAD and SVG-PDA.  - stable w/o CP  - Continue Plavix , has been on this addition to apixaban  at home.  - Statin intolerant, takes Repatha .  - Continue ranolazine  500 bid.  2. Acute on chronic systolic CHF: Ischemic cardiomyopathy.  Has CRT-D device.  Echo in 12/25 was reviewed, EF appears to be 30-35% with moderate RV dysfunction and probably moderate aortic stenosis, IVC dilated.  RHC this admission with elevated right and left heart filling pressures, CI 2 Fick/1.6 thermo.  Patient was admitted for diuresis and started on dobutamine  5.  He diuresed well, wt down 16 lb.  CVP 6, co-ox 74%. Creatinine 2.87 => 3.35=>3.30.  - Transition to PO torsemide  40 mg bid  - Wean dobutamine  to 2.5 mcg/kg/min  - Continue hydralazine  50 mg bid and continue Imdur .   - Continue Jardiance  10 daily - Continue Toprol  XL at lower dose 25 mg daily.  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   - He is not a candidate for advanced therapies with age and CKD stage IV.  3. Atrial fibrillation: Paroxysmal.  A-paced currently.  - Continue apixaban .  4. AKI on CKD stage IV: Creatinine up 2.87 => 3.35=>3.30.   - diuretics and DBA per above - follow BMP  5. Aortic stenosis: Suspect no more than moderate based on recent echo and cath.  6. Carotid stenosis: H/o CEA.  7. H/o complete heart block: Has CRT-D device.  8. Hypokalemia: K 3.3 - give K supp   Length of Stay: 3  Brittainy Simmons, PA-C  05/09/2024, 8:15 AM  Advanced Heart Failure Team Pager [phone] (M-F; 7a - 5p)   Please visit [email or web address]: For overnight coverage please call cardiology fellow first. If fellow not available call Shock/ECMO MD on call.  For ECMO / Mechanical Support (Impella, IABP, LVAD) issues call Shock / ECMO MD on call.    Patient seen with PA, I formulated the plan and agree with the above note.   Breathing stable, has been walking in hall.    Co-ox 74% on dobutamine  5, CVP 9-10 on my  read.  Weight down 5 lbs.  Creatinine 3.35 => 3.3.   General: NAD Neck: JVP 8 cm, no thyromegaly or thyroid  nodule.  Lungs: Clear to auscultation bilaterally with normal respiratory effort. CV: Nondisplaced PMI.  Heart regular S1/S2, no S3/S4, no murmur.  1+ ankle edema.   Abdomen: Soft, nontender, no hepatosplenomegaly, no distention.  Skin: Intact without lesions or rashes.  Neurologic: Alert and oriented x 3.  Psych: Normal affect. Extremities: No clubbing or cyanosis.  HEENT: Normal.   I will start to wean dobutamine  today, decrease to 2.5 mcg/kg/min.  Hopefully stop tomorrow.  Increase hydralazine  to tid.  With CVP 10, will start on torsemide  60 mg daily today for home (his home dose of Lasix  has been 80 mg po daily x 2 years).   Ezra Shuck 05/09/2024 9:46 AM

## 2024-05-10 ENCOUNTER — Other Ambulatory Visit (HOSPITAL_COMMUNITY): Payer: Self-pay

## 2024-05-10 LAB — BASIC METABOLIC PANEL WITH GFR
Anion gap: 12 (ref 5–15)
BUN: 62 mg/dL — ABNORMAL HIGH (ref 8–23)
CO2: 24 mmol/L (ref 22–32)
Calcium: 8.9 mg/dL (ref 8.9–10.3)
Chloride: 104 mmol/L (ref 98–111)
Creatinine, Ser: 3.13 mg/dL — ABNORMAL HIGH (ref 0.61–1.24)
GFR, Estimated: 19 mL/min — ABNORMAL LOW
Glucose, Bld: 80 mg/dL (ref 70–99)
Potassium: 4.1 mmol/L (ref 3.5–5.1)
Sodium: 139 mmol/L (ref 135–145)

## 2024-05-10 LAB — COOXEMETRY PANEL
Carboxyhemoglobin: 2.4 % — ABNORMAL HIGH (ref 0.5–1.5)
Methemoglobin: <0.7 % (ref 0.0–1.5)
O2 Saturation: 74.2 %
Total hemoglobin: 11.8 g/dL — ABNORMAL LOW (ref 12.0–16.0)

## 2024-05-10 NOTE — Plan of Care (Signed)
" °  Problem: Education: Goal: Understanding of CV disease, CV risk reduction, and recovery process will improve Outcome: Progressing Goal: Individualized Educational Video(s) Outcome: Progressing   Problem: Activity: Goal: Ability to return to baseline activity level will improve Outcome: Progressing   Problem: Cardiovascular: Goal: Ability to achieve and maintain adequate cardiovascular perfusion will improve Outcome: Progressing Goal: Vascular access site(s) Level 0-1 will be maintained Outcome: Progressing   Problem: Health Behavior/Discharge Planning: Goal: Ability to safely manage health-related needs after discharge will improve Outcome: Progressing   Problem: Education: Goal: Knowledge of General Education information will improve Description: Including pain rating scale, medication(s)/side effects and non-pharmacologic comfort measures Outcome: Progressing   Problem: Activity: Goal: Risk for activity intolerance will decrease Outcome: Progressing   Problem: Coping: Goal: Level of anxiety will decrease Outcome: Progressing   Problem: Elimination: Goal: Will not experience complications related to bowel motility Outcome: Progressing Goal: Will not experience complications related to urinary retention Outcome: Progressing   Problem: Education: Goal: Ability to demonstrate management of disease process will improve Outcome: Progressing Goal: Ability to verbalize understanding of medication therapies will improve Outcome: Progressing Goal: Individualized Educational Video(s) Outcome: Progressing   Problem: Activity: Goal: Capacity to carry out activities will improve Outcome: Progressing   Problem: Cardiac: Goal: Ability to achieve and maintain adequate cardiopulmonary perfusion will improve Outcome: Progressing   "

## 2024-05-10 NOTE — Care Management Important Message (Signed)
 Important Message  Patient Details  Name: Kyle Avila MRN: [id number] Date of Birth: 03-14-1946   Important Message Given:  Yes - Medicare IM     Vonzell Arrie Sharps 05/10/2024, 11:58 AM

## 2024-05-10 NOTE — Progress Notes (Addendum)
 Patient ID: Kyle Avila, male   DOB: Aug 29, 1945, 79 y.o.   MRN: [id number] P    Advanced Heart Failure Rounding Note  Cardiologist: [name], MD  Chief Complaint: CHF   Patient Profile   79 y/o male with a long standing history of ischemic cardiomyopathy, CAD s/p prior CABG, redo CABG, and multiple PCIs,who presented for scheduled L/RHC in the setting of worsening shortness of breath with exertion. RHC showed biventricular elevated filling pressures as well as severely reduced cardiac index by TD. LHC no targetd for PCI. Admitted for inotrope assisted diuresis.   Subjective   Remains on DBA 2.5. Diuretics held yesterday. 1.3L in UOP   Co-ox 74% today. SCr 2.96 => 2.87 => 3.35 =>3.30.  Weight stable. CVP 9. Denies CP/SOB.   Objective:   Weight Range: 71.7 kg Body mass index is 24.02 kg/m.   Vital Signs:   Temp:  [97.9 F (36.6 C)-98.2 F (36.8 C)] 98 F (36.7 C) (01/13 0517) Pulse Rate:  [60-71] 60 (01/13 0753) Resp:  [18-19] 18 (01/13 0753) BP: (128-155)/(61-67) 133/62 (01/13 0753) SpO2:  [96 %-98 %] 96 % (01/13 0517) Weight:  [71.7 kg] 71.7 kg (01/13 0517) Last BM Date : 05/10/24  Weight change: Filed Weights   05/08/24 0427 05/09/24 0526 05/10/24 0517  Weight: 73.7 kg 71.4 kg 71.7 kg    Intake/Output:   Intake/Output Summary (Last 24 hours) at 05/10/2024 0928 Last data filed at 05/10/2024 0500 Gross per 24 hour  Intake 1680 ml  Output 1925 ml  Net -245 ml     Physical Exam   General:  elderly appearing.  No respiratory difficulty Neck: JVD ~8 cm.  Cor: Regular rate & rhythm. 2/6 MR Lungs: clear Extremities: no edema  Neuro: alert & oriented x 3. Affect pleasant.   Telemetry   A-BiV pacing (personally reviewed)  Labs   CBC No results for input(s): WBC, NEUTROABS, HGB, HCT, MCV, PLT in the last 72 hours.  Basic Metabolic Panel Recent Labs    98/87/73 0508 05/10/24 0510  NA 140 139  K 3.3* 4.1  CL 103 104  CO2 25 24   GLUCOSE 92 80  BUN 63* 62*  CREATININE 3.30* 3.13*  CALCIUM  8.5* 8.9   Liver Function Tests No results for input(s): AST, ALT, ALKPHOS, BILITOT, PROT, ALBUMIN in the last 72 hours.  No results for input(s): LIPASE, AMYLASE in the last 72 hours. Cardiac Enzymes No results for input(s): CKTOTAL, CKMB, CKMBINDEX, TROPONINI in the last 72 hours.  BNP: BNP (last 3 results) No results for input(s): BNP in the last 8760 hours.  ProBNP (last 3 results) No results for input(s): PROBNP in the last 8760 hours.   D-Dimer No results for input(s): DDIMER in the last 72 hours. Hemoglobin A1C No results for input(s): HGBA1C in the last 72 hours. Fasting Lipid Panel No results for input(s): CHOL, HDL, LDLCALC, TRIG, CHOLHDL, LDLDIRECT in the last 72 hours. Medications:   Scheduled Medications:  allopurinol   100 mg Oral QHS   apixaban   5 mg Oral BID   Chlorhexidine  Gluconate Cloth  6 each Topical Daily   clopidogrel   75 mg Oral Daily   empagliflozin   10 mg Oral QAC breakfast   hydrALAZINE   50 mg Oral Q8H   isosorbide  mononitrate  120 mg Oral Daily   loratadine   10 mg Oral Daily   metoprolol  succinate  25 mg Oral Daily   ranolazine   500 mg Oral BID   sodium chloride  flush  10-40  mL Intracatheter Q12H   sodium chloride  flush  3 mL Intravenous Q12H   tamsulosin   0.4 mg Oral Daily   torsemide   60 mg Oral Daily    Infusions:  DOBUTamine  2.5 mcg/kg/min (05/09/24 1047)    PRN Medications: acetaminophen , guaiFENesin -dextromethorphan , nitroGLYCERIN , ondansetron  (ZOFRAN ) IV, sodium chloride  flush, sodium chloride  flush  Assessment/Plan   1. CAD: S/p CABG followed by redo CABG.  Coronary angiography 1/9 with no target for PCI.  Patent LIMA-LAD and SVG-PDA.  - stable w/o CP  - Continue Plavix , has been on this addition to apixaban  at home.  - Statin intolerant, takes Repatha .  - Continue ranolazine  500 bid.  2. Acute on chronic systolic CHF:  Ischemic cardiomyopathy.  Has CRT-D device.  Echo in 12/25 was reviewed, EF appears to be 30-35% with moderate RV dysfunction and probably moderate aortic stenosis, IVC dilated.  RHC this admission with elevated right and left heart filling pressures, CI 2 Fick/1.6 thermo.  Patient was admitted for diuresis and started on dobutamine  5.  He diuresed well, wt down 16 lb.  CVP 9, co-ox 75%. Creatinine 2.87 => 3.35=>3.30>3.13.  - Continue Torsemide  60 mg daily - Stop DBA today. Co-ox stable.  - Continue hydralazine  50 mg tid and continue Imdur .   - Continue Jardiance  10 daily - Continue Toprol  XL at lower dose 25 mg daily.  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   - He is not a candidate for advanced therapies with age and CKD stage IV.  3. Atrial fibrillation: Paroxysmal.  A-paced currently.  - Continue apixaban .  4. AKI on CKD stage IV: Creatinine up 2.87 => 3.35=>3.30>3.13.   - diuretics and DBA per above - follow BMP  5. Aortic stenosis: Suspect no more than moderate based on recent echo and cath.  6. Carotid stenosis: H/o CEA.  7. H/o complete heart block: Has CRT-D device.  8. Hypokalemia: resolved  Length of Stay: 4  Beckey LITTIE Coe, NP  05/10/2024, 9:28 AM  Advanced Heart Failure Team Pager [phone] (M-F; 7a - 5p)   Please visit [email or web address]: For overnight coverage please call cardiology fellow first. If fellow not available call Shock/ECMO MD on call.  For ECMO / Mechanical Support (Impella, IABP, LVAD) issues call Shock / ECMO MD on call.    Patient seen with NP, I formulated the plan and agree with the above note.    Co-ox 75%, CVP 8 today.  Creatinine lower at 3.13.  He remains on dobutamine  2.5 mcg/kg/min.    No dyspnea.   General: NAD Neck: No JVD, no thyromegaly or thyroid  nodule.  Lungs: Clear to auscultation bilaterally with normal respiratory effort. CV: Nondisplaced PMI.  Heart regular S1/S2, no S3/S4, no murmur.  1+ ankle edema.   Abdomen: Soft, nontender, no  hepatosplenomegaly, no distention.  Skin: Intact without lesions or rashes.  Neurologic: Alert and oriented x 3.  Psych: Normal affect. Extremities: No clubbing or cyanosis.  HEENT: Normal.   Stop dobutamine  today and follow co-ox, creatinine mildly lower at 3.13.  CVP adequate today and will continue torsemide  60 mg daily.   He is a-paced, remains on apixaban .   Ezra Shuck 05/10/2024 11:56 AM

## 2024-05-11 ENCOUNTER — Other Ambulatory Visit (HOSPITAL_COMMUNITY): Payer: Self-pay

## 2024-05-11 LAB — COOXEMETRY PANEL
Carboxyhemoglobin: 1.7 % — ABNORMAL HIGH (ref 0.5–1.5)
Methemoglobin: 1.1 % (ref 0.0–1.5)
O2 Saturation: 63.4 %
Total hemoglobin: 12 g/dL (ref 12.0–16.0)

## 2024-05-11 LAB — BASIC METABOLIC PANEL WITH GFR
Anion gap: 11 (ref 5–15)
BUN: 61 mg/dL — ABNORMAL HIGH (ref 8–23)
CO2: 24 mmol/L (ref 22–32)
Calcium: 9 mg/dL (ref 8.9–10.3)
Chloride: 103 mmol/L (ref 98–111)
Creatinine, Ser: 3 mg/dL — ABNORMAL HIGH (ref 0.61–1.24)
GFR, Estimated: 20 mL/min — ABNORMAL LOW
Glucose, Bld: 94 mg/dL (ref 70–99)
Potassium: 4.1 mmol/L (ref 3.5–5.1)
Sodium: 138 mmol/L (ref 135–145)

## 2024-05-11 MED ORDER — METOPROLOL SUCCINATE ER 25 MG PO TB24
25.0000 mg | ORAL_TABLET | Freq: Every day | ORAL | 2 refills | Status: DC
  Filled 2024-05-11: qty 30, 30d supply, fill #0

## 2024-05-11 MED ORDER — HYDRALAZINE HCL 50 MG PO TABS
50.0000 mg | ORAL_TABLET | Freq: Three times a day (TID) | ORAL | 2 refills | Status: DC
  Filled 2024-05-11: qty 90, 30d supply, fill #0

## 2024-05-11 MED ORDER — TORSEMIDE 20 MG PO TABS
60.0000 mg | ORAL_TABLET | Freq: Every day | ORAL | 2 refills | Status: DC
  Filled 2024-05-11: qty 90, 30d supply, fill #0

## 2024-05-11 NOTE — Progress Notes (Signed)
 CARDIAC REHAB PHASE I   PRE:  Rate/Rhythm: 72 pacing    BP: sitting 157/80    SpO2: 95 RA  MODE:  Ambulation: 800 ft   POST:  Rate/Rhythm: 84 pacing    BP: sitting 169/66     SpO2: 95 RA   Pt has been walking 6-7 laps x3 a day on the unit. Sts this morning he felt a little tired after one lap. We ambulated and talked the entire time, felt some SOB after 700 ft then returned to room. VSS.  Discussed with pt HF management, exercise, and CRPII. He attends an aerobic [employer] program 5 days a week and prefers to return there but is not sure he will be able to with his new norm. We discussed walking first then attempting the [employer]. If he struggles, he should do CRPII. Pt receptive. Will refer to [facility] CRPII.  [phone]  Aliene Aris BS, ACSM-CEP 05/11/2024 11:50 AM

## 2024-05-11 NOTE — TOC Progression Note (Signed)
 Transition of Care [facility]) - Progression Note    Patient Details  Name: Kyle Avila MRN: [id number] Date of Birth: 02/07/1946  Transition of Care [facility]) CM/SW Contact  Arlana JINNY Nicholaus ISRA[place] Phone Number: [phone] 05/11/2024, 12:08 PM  Clinical Narrative:   HF CSW attempted to schedule patients hospital follow up appointment. CSW left a VM, waiting for the PCP nurse to call back to schedule appointment.     Expected Discharge Plan: Home/Self Care Barriers to Discharge: Continued Medical Work up               Expected Discharge Plan and Services       Living arrangements for the past 2 months: Single Family Home Expected Discharge Date: 05/11/24                                     Social Drivers of Health (SDOH) Interventions SDOH Screenings   Food Insecurity: No Food Insecurity (05/07/2024)  Housing: Low Risk (05/07/2024)  Transportation Needs: No Transportation Needs (05/07/2024)  Utilities: Not At Risk (05/07/2024)  Depression (PHQ2-9): Medium Risk (03/27/2022)  Social Connections: Socially Isolated (05/07/2024)  Tobacco Use: Medium Risk (05/07/2024)    Readmission Risk Interventions     No data to display

## 2024-05-11 NOTE — Progress Notes (Signed)
 VAST consult. PICC line removal. Educated patient on process. HOB less than 45*. Pt held breath upon line removal. Pressure held to site for 5 min. No bleeding noted. Instructed pt to remain in bed for 30 min, monitoring and reporting any s/sx of bleeding. Instructed to keep drsg CDI for 24 hrs. Pt VU. Powell Bowler, RN VAST

## 2024-05-11 NOTE — Progress Notes (Addendum)
 Patient ID: Kyle Avila, male   DOB: 06-Jun-1945, 79 y.o.   MRN: [id number] P    Advanced Heart Failure Rounding Note  Cardiologist: [name], MD  Chief Complaint: CHF  Patient Profile   79 y/o male with a long standing history of ischemic cardiomyopathy, CAD s/p prior CABG, redo CABG, and multiple PCIs,who presented for scheduled L/RHC in the setting of worsening shortness of breath with exertion. RHC showed biventricular elevated filling pressures as well as severely reduced cardiac index by TD. LHC no targetd for PCI. Admitted for inotrope assisted diuresis.   Subjective   DBA stopped yesterday. Co-ox 63% today. CVP 4-5   2L in UOP w/ torsemide  yesterday.   SCr continues to improve, 3.3>>3.1>>3.0   Feeling better. Breathing improved. No CP   Objective:   Weight Range: 71.2 kg Body mass index is 23.87 kg/m.   Vital Signs:   Temp:  [97.5 F (36.4 C)-98.2 F (36.8 C)] 97.5 F (36.4 C) (01/14 0817) Pulse Rate:  [61-75] 64 (01/14 0817) Resp:  [17-20] 18 (01/14 0817) BP: (129-150)/(57-74) 134/67 (01/14 0817) SpO2:  [100 %] 100 % (01/14 0817) Weight:  [71.2 kg] 71.2 kg (01/14 0522) Last BM Date : 05/10/24  Weight change: Filed Weights   05/09/24 0526 05/10/24 0517 05/11/24 0522  Weight: 71.4 kg 71.7 kg 71.2 kg    Intake/Output:   Intake/Output Summary (Last 24 hours) at 05/11/2024 0944 Last data filed at 05/11/2024 0817 Gross per 24 hour  Intake 2680 ml  Output 2335 ml  Net 345 ml     Physical Exam   GENERAL: NAD Lungs- clear  CARDIAC:  JVP not elevated.          Normal rate with regular rhythm. 2/6 MR murmur, no LEE  ABDOMEN: Soft, non-tender, non-distended.  EXTREMITIES: Warm and well perfused.  NEUROLOGIC: No obvious FND   Telemetry   A-V paced 60s (personally reviewed)  Labs   CBC No results for input(s): WBC, NEUTROABS, HGB, HCT, MCV, PLT in the last 72 hours.  Basic Metabolic Panel Recent Labs    98/86/73 0510  05/11/24 0527  NA 139 138  K 4.1 4.1  CL 104 103  CO2 24 24  GLUCOSE 80 94  BUN 62* 61*  CREATININE 3.13* 3.00*  CALCIUM  8.9 9.0   Liver Function Tests No results for input(s): AST, ALT, ALKPHOS, BILITOT, PROT, ALBUMIN in the last 72 hours.  No results for input(s): LIPASE, AMYLASE in the last 72 hours. Cardiac Enzymes No results for input(s): CKTOTAL, CKMB, CKMBINDEX, TROPONINI in the last 72 hours.  BNP: BNP (last 3 results) No results for input(s): BNP in the last 8760 hours.  ProBNP (last 3 results) No results for input(s): PROBNP in the last 8760 hours.   D-Dimer No results for input(s): DDIMER in the last 72 hours. Hemoglobin A1C No results for input(s): HGBA1C in the last 72 hours. Fasting Lipid Panel No results for input(s): CHOL, HDL, LDLCALC, TRIG, CHOLHDL, LDLDIRECT in the last 72 hours. Medications:   Scheduled Medications:  allopurinol   100 mg Oral QHS   apixaban   5 mg Oral BID   Chlorhexidine  Gluconate Cloth  6 each Topical Daily   clopidogrel   75 mg Oral Daily   empagliflozin   10 mg Oral QAC breakfast   hydrALAZINE   50 mg Oral Q8H   isosorbide  mononitrate  120 mg Oral Daily   loratadine   10 mg Oral Daily   metoprolol  succinate  25 mg Oral Daily   ranolazine   500 mg Oral BID   sodium chloride  flush  10-40 mL Intracatheter Q12H   sodium chloride  flush  3 mL Intravenous Q12H   tamsulosin   0.4 mg Oral Daily   torsemide   60 mg Oral Daily    Infusions:    PRN Medications: acetaminophen , guaiFENesin -dextromethorphan , nitroGLYCERIN , ondansetron  (ZOFRAN ) IV, sodium chloride  flush, sodium chloride  flush  Assessment/Plan   1. CAD: S/p CABG followed by redo CABG.  Coronary angiography 1/9 with no target for PCI.  Patent LIMA-LAD and SVG-PDA.  - stable w/o CP  - Continue Plavix . No ASA given need for apixaban .  - Statin intolerant, takes Repatha .  - Continue ranolazine  500 bid.  2. Acute on chronic systolic  CHF: Ischemic cardiomyopathy.  Has CRT-D device.  Echo in 12/25 was reviewed, EF appears to be 30-35% with moderate RV dysfunction and probably moderate aortic stenosis, IVC dilated.  RHC this admission with elevated right and left heart filling pressures, CI 2 Fick/1.6 thermo.  Patient was admitted for diuresis and started on dobutamine  5.  He diuresed well and now back on PO torsemide . Now off DBA, Co-ox 63%, CVP 4-5. Creatinine 2.87 => 3.35=>3.30=>3.13=>3.0   - Continue Torsemide  60 mg daily - Continue hydralazine  50 mg tid and continue Imdur .   - Continue Jardiance  10 daily - Continue Toprol  XL at lower dose 25 mg daily.  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   - He is not a candidate for advanced therapies with age and CKD stage IV.  3. Atrial fibrillation: Paroxysmal.  A-paced currently.  - Continue apixaban .  4. AKI on CKD stage IV: Creatinine up 2.87 => 3.35=>3.30=>3.13=>3.00   - follow BMP  5. Aortic stenosis: Suspect no more than moderate based on recent echo and cath.  6. Carotid stenosis: H/o CEA.  7. H/o complete heart block: Has CRT-D device.  8. Hypokalemia: resolved  Ambulate w/ CR. Anticipate home later today vs tomorrow.   Length of Stay: [address] PA-C  05/11/2024, 9:44 AM  Advanced Heart Failure Team Pager [phone] (M-F; 7a - 5p)   Please visit [email or web address]: For overnight coverage please call cardiology fellow first. If fellow not available call Shock/ECMO MD on call.  For ECMO / Mechanical Support (Impella, IABP, LVAD) issues call Shock / ECMO MD on call.    Patient seen with PA, I formulated the plan and agree with the above note.   Co-ox 63% today off inotropes.  CVP 4-5. Creatinine coming down, 3 today.    Walked in hall without much problem today.   General: NAD Neck: No JVD, no thyromegaly or thyroid  nodule.  Lungs: Clear to auscultation bilaterally with normal respiratory effort. CV: Nondisplaced PMI.  Heart regular S1/S2, no S3/S4, no  murmur.  Trace ankle edema.  Abdomen: Soft, nontender, no hepatosplenomegaly, no distention.  Skin: Intact without lesions or rashes.  Neurologic: Alert and oriented x 3.  Psych: Normal affect. Extremities: No clubbing or cyanosis.  HEENT: Normal.   Volume status appears optimized, co-ox adequate off inotropes.  I think he is ready for home today.  GDMT limited by CKD stage IV.  Creatinine trending back towards his baseline.    We will arrange close followup in CHF clinic.  Meds for home: hydralazine  50 mg tid, Imdur  120 daily, Toprol  XL 25 daily, Jardiance  10 daily, apixaban  5 bid, Plavix  75 daily, ranolazine  500 bid, torsemide  60 daily.   Ezra Shuck 05/11/2024 11:42 AM

## 2024-05-11 NOTE — Discharge Summary (Addendum)
 Advanced Heart Failure Team  Discharge Summary   Patient ID: Kyle Avila MRN: [id number], DOB/AGE: [birth year] 80 y.o. Admit date: 05/06/2024 D/C date:     05/11/2024   Primary Discharge Diagnoses:  Acute on Chronic Systolic Heart Failure w/ Low output CAD, medical management   Secondary Discharge Diagnoses:  H/o CHB s/p CRT-P CKD IV  Moderate Aortic Stenosis   Hospital Course:   79 y/o male with a long standing history of ischemic cardiomyopathy (EF 30-35%, Mod RV dysfunction by echo 12/25), moderate aortic stenosis, CAD s/p prior CABG followed by redo CABG, and multiple PCIs, h/o CHB s/p CRT-P and CKD IV who presented for scheduled L/RHC in the setting of worsening shortness of breath with exertion. RHC showed biventricular elevated filling pressures as well as severely reduced cardiac index by TD (RA 16, PA 70-21, PCW 27, TD CI 1.6, PVR 3.2 WU). LHC no targets for PCI. He was admitted from cath lab for inotrope assisted diuresis. Placed on DBA and diuresed w/ IV Lasix . PICC placed to follow co-ox and CVPs. Diuresed 16 lb w/ improvement of symptoms. Transitioned to oral torsemide  and weaned off of DBA w/ stable co-ox. GDMT limited by renal fx. Treated w/ hydral/nitrates and SGLT2i. Not felt to be candidate for advanced therapies given age and CKD IV.    On 1/14, he was last seen and examined by Dr. Rolan and felt to be stable for d/c home. Hospital f/u has been arranged in the AHF clinic. Will also follow up in device clinic w/ Dr. Kennyth.     Discharge Weight Range: 157 lb Discharge Vitals: Blood pressure (!) 156/73, pulse 66, temperature (!) 97.5 F (36.4 C), temperature source Oral, resp. rate 19, height 5' 8 (1.727 m), weight 71.2 kg, SpO2 100%.  Labs: Lab Results  Component Value Date   WBC 4.5 05/06/2024   HGB 12.0 (L) 05/06/2024   HCT 36.5 (L) 05/06/2024   MCV 109.0 (H) 05/06/2024   PLT 137 (L) 05/06/2024    Recent Labs  Lab 05/06/24 2002 05/07/24 0410 05/11/24 0527   NA 139   < > 138  K 4.8   < > 4.1  CL 105   < > 103  CO2 22   < > 24  BUN 64*   < > 61*  CREATININE 2.96*   < > 3.00*  CALCIUM  9.1   < > 9.0  PROT 6.4*  --   --   BILITOT 0.5  --   --   ALKPHOS 107  --   --   ALT 22  --   --   AST 25  --   --   GLUCOSE 105*   < > 94   < > = values in this interval not displayed.   Lab Results  Component Value Date   CHOL 96 (L) 04/07/2022   HDL 57 04/07/2022   LDLCALC 15 04/07/2022   TRIG 145 04/07/2022   BNP (last 3 results) No results for input(s): BNP in the last 8760 hours.  ProBNP (last 3 results) No results for input(s): PROBNP in the last 8760 hours.   Diagnostic Studies/Procedures   [facility] 05/06/24 1.  Severe native vessel coronary artery disease with known total occlusion of the RCA, total occlusion of the ostial LAD, and continued patency of the left main stent into the left circumflex 2.  Status post redo CABG with continued patency of the LIMA to LAD with moderate LIMA to LAD insertion site stenosis of 60%  and continued patency of the SVG to distal RCA with patency of the stented segments in the mid and distal body of the graft and mild in-stent restenosis 3.  Mild aortic stenosis with mean gradient 9 mmHg and calculated aortic valve area of 1.6 cm 4.  Right heart catheterization data: RA mean 16 mmHg RV 72/4 RV EDP 20 mmHg PA 70/21 mean 39 mmHg Pulmonary wedge A-wave 30, V wave 39, mean 27 mmHg LVEDP 14 mmHg Cardiac output 3.77 L/min by Fick, 3.06 L/min by thermodilution Cardiac index 2.0 by Fick and 1.6 by thermodilution PVR 3.2 Woods units   Recommendations: Medical therapy for CAD.  Patient has hemodynamic evidence of decompensated heart failure with low cardiac output.  Discharge Medications   Allergies as of 05/11/2024       Reactions   Amiodarone  Other (See Comments)   Lung toxicity         Medication List     STOP taking these medications    amLODipine  5 MG tablet Commonly known as: NORVASC     furosemide  80 MG tablet Commonly known as: LASIX        TAKE these medications    acetaminophen  500 MG tablet Commonly known as: TYLENOL  Take 1,000 mg by mouth every 6 (six) hours as needed for moderate pain.   allopurinol  100 MG tablet Commonly known as: ZYLOPRIM  Take 100 mg by mouth at bedtime.   apixaban  5 MG Tabs tablet Commonly known as: Eliquis  Take 1 tablet (5 mg total) by mouth 2 (two) times daily.   azelastine  0.1 % nasal spray Commonly known as: ASTELIN  Place 1 spray into both nostrils 2 (two) times daily as needed for rhinitis or allergies. Use in each nostril as directed   clopidogrel  75 MG tablet Commonly known as: PLAVIX  TAKE 1 TABLET AT BEDTIME   empagliflozin  10 MG Tabs tablet Commonly known as: Jardiance  Take 1 tablet (10 mg total) by mouth daily before breakfast.   Eylea  2 MG/0.05ML Soln Generic drug: Aflibercept  by Intravitreal route See admin instructions. Every other month   fexofenadine 180 MG tablet Commonly known as: ALLEGRA Take 180 mg by mouth daily.   hydrALAZINE  50 MG tablet Commonly known as: APRESOLINE  Take 1 tablet (50 mg total) by mouth 3 (three) times daily. What changed:  medication strength how much to take   isosorbide  mononitrate 120 MG 24 hr tablet Commonly known as: IMDUR  TAKE 1 TABLET (120 MG TOTAL) BY MOUTH DAILY.   ketoconazole 2 % cream Commonly known as: NIZORAL Apply 1 application  topically daily as needed (itching toes, nose and eye brow).   metoprolol  succinate 25 MG 24 hr tablet Commonly known as: TOPROL -XL Take 1 tablet (25 mg total) by mouth daily. Take with or immediately following a meal. What changed:  medication strength how much to take   nitroGLYCERIN  0.4 MG SL tablet Commonly known as: NITROSTAT  Place 1 tablet (0.4 mg total) under the tongue every 5 (five) minutes as needed for chest pain.   ranolazine  500 MG 12 hr tablet Commonly known as: RANEXA  Take 1 tablet (500 mg total) by mouth 2  (two) times daily.   Repatha  SureClick 140 MG/ML Soaj Generic drug: Evolocumab  Inject 140 mg into the skin every 14 (fourteen) days.   tamsulosin  0.4 MG Caps capsule Commonly known as: FLOMAX  Take 0.4 mg by mouth daily.   torsemide  20 MG tablet Commonly known as: DEMADEX  Take 3 tablets (60 mg total) by mouth daily. Start taking on: May 12, 2024  Disposition   The patient will be discharged in stable condition to home. Discharge Instructions     Amb Referral to Cardiac Rehabilitation   Complete by: As directed    Diagnosis: Heart Failure (see criteria below if ordering Phase II)   Heart Failure Type: Chronic Systolic & Diastolic   After initial evaluation and assessments completed: Virtual Based Care may be provided alone or in conjunction with Phase 2 Cardiac Rehab based on patient barriers.: Yes   Intensive Cardiac Rehabilitation (ICR) [facility] location only OR Traditional Cardiac Rehabilitation (TCR) *If criteria for ICR are not met will enroll in TCR [facility] only): Yes       Follow-up Information     Kennyth Chew, MD Follow up.   Specialties: Cardiology, Radiology Why: 05/17/24 at 9:00 AM Contact information: [address] [place] [address] [phone]         [facility] Follow up.   Specialty: Cardiology Why: 05/26/24 at 9:30 AM  Hospital follow up in the Advanced Heart Failure Clinic at [facility], Entrance C (Women and Children's Entrance), Valet parking [employer] information: [address] [place]  [phone]        Janey Santos, MD. Go to.   Specialty: Internal Medicine Why: PCP will contact the patient directly to schedule appointment. Contact information: [address] [place] [address] [phone]                   Duration of Discharge Encounter: Greater than 15 minutes   Signed, Caffie Marcine RIGGERS   05/11/2024, 3:53 PM   Patient seen with PA, I formulated the plan and agree with the above note.   Please see my separate note from today.   31 minutes physician discharge time.   Ezra Shuck 05/11/2024

## 2024-05-16 ENCOUNTER — Telehealth (HOSPITAL_COMMUNITY): Payer: Self-pay

## 2024-05-16 NOTE — Telephone Encounter (Signed)
 Called patient to see if he is interested in the Cardiac Rehab Program. Patient expressed interest. Explained scheduling process and advised that we would check insurance for patient responsibility.  Patient verbalized understanding. Will contact patient for scheduling once f/u has been completed.

## 2024-05-16 NOTE — Progress Notes (Unsigned)
 "   Electrophysiology Office Note:   Date:  05/18/2024  ID:  Kyle Avila, DOB [birth year], MRN [id number]  Primary Cardiologist: [name], MD Electrophysiologist: Fonda Kitty, MD      History of Present Illness:   Kyle Avila is a 79 y.o. 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  Discussed the use of AI scribe software for clinical note transcription with the patient, who gave verbal consent to proceed.  History of Present Illness Kyle Avila is a 79 year old male with a pacemaker who presents for a follow-up regarding pacemaker function and optimization.  He has a dual-chamber pacemaker, and previous adjustments in late 2023 significantly reduced his need for nitroglycerin . BiV pacemaker usage had previously dropped to the 80% range.  During a trip to [place] , he experienced a drop in oxygen  levels to the mid to lower 80s, which affected his ability to engage in activities like skiing. His oxygen  levels fell as soon as he arrived at the [facility] airport, impacting his plans.  He has a persistent cough that has been ongoing for 15 years, which has not been resolved despite multiple evaluations. He also notes a weight decrease from 162-163 pounds to 150 pounds, with a recent change from Lasix  to Torsemide . He monitors his fluid intake, keeping it under half a gallon per day, and has noticed a gradual weight decrease since hospitalization.  He is currently taking bisoprolol and Torsemide , with adjustments based on daily weight monitoring. He has a history of atrial fibrillation and has undergone at least three cardioversions.  Review of systems complete and found to be negative unless listed in HPI.   EP Information / Studies Reviewed:    EKG is ordered  today. Personal review as below.      Echo 03/2024:   1. Left ventricular ejection fraction, by estimation, is 40 to 45%. The  left ventricle has mildly decreased function. The left ventricle  demonstrates regional wall motion abnormalities (see scoring  diagram/findings for description). There is mild left  ventricular hypertrophy. Left ventricular diastolic parameters are  indeterminate.   2. Right ventricular systolic function is moderately reduced. The right  ventricular size is normal. There is moderately elevated pulmonary artery  systolic pressure. The estimated right ventricular systolic pressure is  58.3 mmHg.   3. Left atrial size was mildly dilated.   4. Right atrial size was moderately dilated.   5. The mitral valve is grossly normal. Mild mitral valve regurgitation.  No evidence of mitral stenosis.   6. The aortic valve is abnormal. There is severe calcifcation of the  aortic valve. Aortic valve regurgitation is trivial. Moderate low flow low  gradient aortic valve stenosis. Aortic valve area, by VTI measures 0.88  cm. Aortic valve mean gradient  measures 17.0 mmHg. Aortic valve Vmax measures 2.58 m/s. DVI 0.28.   7. The inferior vena cava is dilated in size with <50% respiratory  variability, suggesting right atrial pressure of 15 mmHg.   LHC/RHC 05/06/24:  1.  Severe native vessel coronary artery disease with known total occlusion of the RCA, total occlusion of the ostial LAD, and continued patency of the left main stent into the left circumflex 2.  Status post redo CABG with continued patency of the LIMA to  LAD with moderate LIMA to LAD insertion site stenosis of 60% and continued patency of the SVG to distal RCA with patency of the stented segments in the mid and distal body of the graft and mild in-stent restenosis 3.  Mild aortic stenosis with mean gradient 9 mmHg and calculated aortic valve area of 1.6 cm 4.  Right heart catheterization data: RA mean 16 mmHg RV  72/4 RV EDP 20 mmHg PA 70/21 mean 39 mmHg Pulmonary wedge A-wave 30, V wave 39, mean 27 mmHg LVEDP 14 mmHg Cardiac output 3.77 L/min by Fick, 3.06 L/min by thermodilution Cardiac index 2.0 by Fick and 1.6 by thermodilution PVR 3.2 Woods units   Recommendations: Medical therapy for CAD.  Patient has hemodynamic evidence of decompensated heart failure with low cardiac output.  I discussed his case with the advanced heart failure team who evaluated the patient in person in the cardiac catheterization lab.  We agree he would benefit from hospitalization with inotropic therapy and IV diuresis.  Discussed with the patient who is in agreement with this plan.  Appreciate heart failure team consultation and care.   Risk Assessment/Calculations:    CHA2DS2-VASc Score = 5   This indicates a 7.2% annual risk of stroke. [name] score is based upon: CHF History: 1 HTN History: 1 Diabetes History: 0 Stroke History: 0 Vascular Disease History: 1 Age Score: 2 Gender Score: 0          Physical Exam:   VS:  BP 124/72   Pulse 68   Ht 5' 8 (1.727 m)   Wt 161 lb 3.2 oz (73.1 kg)   SpO2 98%   BMI 24.51 kg/m    Wt Readings from Last 3 Encounters:  05/17/24 161 lb 3.2 oz (73.1 kg)  05/11/24 157 lb (71.2 kg)  04/19/24 157 lb (71.2 kg)     GEN: Well nourished, well developed in no acute distress NECK: No JVD CARDIAC: Normal rate, regular rhythm. 4/6 SEM.  Well-healed left chest pacer pocket. RESPIRATORY:  Clear to auscultation without rales, wheezing or rhonchi  ABDOMEN: Soft, non-distended EXTREMITIES:  No edema; No deformity   ASSESSMENT AND PLAN:    #CHB s/p CRT-P:  - Device interrogation performed.  Appropriate device function and stable lead parameters.  No atrial fibrillation since cardioversion.  Presenting rhythm is AP-BiV paced but with frequent AS events which would inhibit BiV pacing, likely competing with intrinsic sinus rhythm at his lower rate limit. We first tried  lowering his lower rate limit to 50bpm but this did not result in significant improvement, we then increased his lower rate limit to 70bpm and this resulted in significant improvement in BiV pacing percentage. We also adjusted his rate response to hopefully help with his exertional symptoms. After waling in the hall, he did endorse improvement.   #Persistent atrial fibrillation: Has been maintaining in atrial paced rhythm since cardioversion.  #Secondary hypercoagulable state due to AF: No bleeding issues. - We discussed long-term options for rhythm control.  Patient is not interested in antiarrhythmic drug therapy or catheter ablation at this time.  He would like to monitor for AF recurrence. - Continue Eliquis  5 mg twice daily. - Continue metoprolol  XL 100 mg daily. - Monitor AF burden with pacemaker.  #HFmrEF: Well compensated on exam today. #Moderate AS: -If he has recurrence of A-fib then recommend long-term rhythm control strategy. - Continue GDMT regimen of hydralazine , Imdur , metoprolol .  Close follow-up with primary cardiologist Dr. Wonda.  #CAD s/p CABG and PCIs:  Denies chest pain. - Continue Plavix  75 mg daily.  Not on aspirin  due to Eliquis .  Continue close follow-up with Dr. Wonda. Has also seen Dr. Gardenia.  #Hypertension - Above goal today.  Recommend checking blood pressures 1-2 times per week at home and recording the values.  Recommend bringing these recordings to the primary care physician.  Follow up with EP APP in 6 months  Signed, Fonda Kitty, MD  "

## 2024-05-17 ENCOUNTER — Encounter: Payer: Self-pay | Admitting: Cardiology

## 2024-05-17 ENCOUNTER — Telehealth (HOSPITAL_COMMUNITY): Payer: Self-pay | Admitting: Cardiology

## 2024-05-17 ENCOUNTER — Ambulatory Visit: Attending: Cardiology | Admitting: Cardiology

## 2024-05-17 VITALS — BP 124/72 | HR 68 | Ht 68.0 in | Wt 161.2 lb

## 2024-05-17 DIAGNOSIS — I4819 Other persistent atrial fibrillation: Secondary | ICD-10-CM

## 2024-05-17 DIAGNOSIS — D6869 Other thrombophilia: Secondary | ICD-10-CM

## 2024-05-17 DIAGNOSIS — I350 Nonrheumatic aortic (valve) stenosis: Secondary | ICD-10-CM

## 2024-05-17 DIAGNOSIS — Z950 Presence of cardiac pacemaker: Secondary | ICD-10-CM | POA: Diagnosis not present

## 2024-05-17 DIAGNOSIS — I5020 Unspecified systolic (congestive) heart failure: Secondary | ICD-10-CM

## 2024-05-17 DIAGNOSIS — Z951 Presence of aortocoronary bypass graft: Secondary | ICD-10-CM

## 2024-05-17 DIAGNOSIS — I10 Essential (primary) hypertension: Secondary | ICD-10-CM

## 2024-05-17 DIAGNOSIS — I25708 Atherosclerosis of coronary artery bypass graft(s), unspecified, with other forms of angina pectoris: Secondary | ICD-10-CM

## 2024-05-17 LAB — CUP PACEART INCLINIC DEVICE CHECK
Date Time Interrogation Session: 20260120101134
Implantable Lead Connection Status: 753985
Implantable Lead Connection Status: 753985
Implantable Lead Connection Status: 753985
Implantable Lead Implant Date: 20070831
Implantable Lead Implant Date: 20070831
Implantable Lead Implant Date: 20231227
Implantable Lead Location: 753858
Implantable Lead Location: 753859
Implantable Lead Location: 753860
Implantable Lead Model: 3830
Implantable Lead Model: 4469
Implantable Lead Model: 4470
Implantable Lead Serial Number: 485949
Implantable Lead Serial Number: 553665
Implantable Pulse Generator Implant Date: 20231227

## 2024-05-17 NOTE — Patient Instructions (Signed)
 Medication Instructions:  Your physician recommends that you continue on your current medications as directed. Please refer to the Current Medication list given to you today.  *If you need a refill on your cardiac medications before your next appointment, please call your pharmacy*   Follow-Up: At [facility], you and your health needs are our priority.  As part of our continuing mission to provide you with exceptional heart care, our providers are all part of one team.  This team includes your primary Cardiologist (physician) and Advanced Practice Providers or APPs (Physician Assistants and Nurse Practitioners) who all work together to provide you with the care you need, when you need it.  Your next appointment:   3 months  Provider:   You will see one of the following Advanced Practice Providers on your designated Care Team:   Charlies Arthur, [place] Ozell Jodie Passey, PA-C Suzann Riddle, NP Daphne Barrack, NP Artist Pouch, PA-C

## 2024-05-17 NOTE — Telephone Encounter (Signed)
 Patient called   Reports daughter in law Dr Rosaline Livings would like to speak with Dr Rolan # [phone]

## 2024-05-18 ENCOUNTER — Encounter: Payer: Self-pay | Admitting: Cardiology

## 2024-05-19 ENCOUNTER — Other Ambulatory Visit: Payer: Self-pay

## 2024-05-19 ENCOUNTER — Ambulatory Visit: Payer: Self-pay | Admitting: Cardiology

## 2024-05-20 ENCOUNTER — Other Ambulatory Visit (HOSPITAL_COMMUNITY): Payer: Self-pay | Admitting: Cardiology

## 2024-05-20 DIAGNOSIS — I25119 Atherosclerotic heart disease of native coronary artery with unspecified angina pectoris: Secondary | ICD-10-CM

## 2024-05-20 MED ORDER — METOPROLOL SUCCINATE ER 25 MG PO TB24: 25.0000 mg | ORAL_TABLET | Freq: Every day | ORAL | 3 refills | Status: AC

## 2024-05-20 MED ORDER — TORSEMIDE 20 MG PO TABS: 60.0000 mg | ORAL_TABLET | Freq: Every day | ORAL | 3 refills | Status: DC

## 2024-05-20 MED ORDER — HYDRALAZINE HCL 50 MG PO TABS: 50.0000 mg | ORAL_TABLET | Freq: Three times a day (TID) | ORAL | 3 refills | Status: AC

## 2024-05-20 NOTE — Telephone Encounter (Signed)
 Patient called to request #90 day refill on the following meds Lasix , metoprolol , and hydralazine     Meds sent

## 2024-05-20 NOTE — Telephone Encounter (Signed)
 I think this is going to be a Dr. Zenaida patient.

## 2024-05-20 NOTE — Telephone Encounter (Signed)
 Attempting to contact pt to notify of provider update/change   -no answer unable to leave message   Will route to Dr Zenaida

## 2024-05-25 ENCOUNTER — Telehealth (HOSPITAL_COMMUNITY): Payer: Self-pay

## 2024-05-25 NOTE — Telephone Encounter (Signed)
 Called to confirm/remind patient of their appointment at the [facility] on 05/25/24.   Appointment:   [x] Confirmed  [] Left mess   [] No answer/No voice mail  [] VM Full/unable to leave message  [] Phone not in service  Patient reminded to bring all medications and/or complete list.  Confirmed patient has transportation. Gave directions, instructed to utilize valet parking.

## 2024-05-25 NOTE — Progress Notes (Signed)
 "  Advanced [facility] Failure Clinic Note   Referring Physician: PCP: Janey Santos, MD PCP-Cardiologist: [name], MD  AHF: Dr. Zenaida   Chief Complaint: f/u for chronic systolic heart failure  HPI:  79 y/o male with a long standing history of ischemic cardiomyopathy (EF 30-35%, Mod RV dysfunction by echo 12/25), moderate aortic stenosis, PAF, prior h/o amiodarone  lung toxicity, carodid artery disease s/p RCEA, CAD s/p prior CABG followed by redo CABG, and multiple PCIs, h/o CHB s/p CRT-P and CKD IV who presented 1/26 for scheduled outpatient L/RHC in the setting of worsening shortness of breath with exertion. RHC showed elevated biventricular filling pressures as well as severely reduced cardiac index by TD (RA 16, PA 70-21, PCW 27, TD CI 1.6, PVR 3.2 WU). LHC no targets for PCI. He was admitted from cath lab for inotrope assisted diuresis. Placed on DBA and diuresed w/ IV Lasix . PICC placed to follow co-ox and CVPs. Diuresed 16 lb w/ improvement of symptoms. Transitioned to oral torsemide  and weaned off of DBA w/ stable co-ox. GDMT limited by renal fx. Treated w/ hydral/nitrates and SGLT2i. Not felt to be candidate for advanced therapies given age and CKD IV.     He presents today for post hospital f/u. Still having occasional anginal attacks, which is chronic for him. He has to take SL NGT on occasion, took a dose this morning and pain resolved. Remains on high dose Imdur  and Ranexa . From HF standpoint, his home wts have been stable. He was 154 lb at d/c and 156 lb today, He did self increase his torsemide  to 80 mg daily for the last 2 days. No LEE. ReDs upper limits of normal at 35%. BP elevated at 152/82.  He is starting to increase his physical activity. He has gone back to the [facility] and has gone to dance classes, no significant exertional dyspnea. NYHA Class II.     Review of Systems: [y] = yes, [ ]  = no   General: Weight gain [ ] ; Weight loss [ ] ; Anorexia [ ] ; Fatigue [ ] ; Fever [ ] ;  Chills [ ] ; Weakness [ ]   Cardiac: Chest pain/pressure [ ] ; Resting SOB [ ] ; Exertional SOB [ ] ; Orthopnea [ ] ; Pedal Edema [ ] ; Palpitations [ ] ; Syncope [ ] ; Presyncope [ ] ; Paroxysmal nocturnal dyspnea[ ]   Pulmonary: Cough [ ] ; Wheezing[ ] ; Hemoptysis[ ] ; Sputum [ ] ; Snoring [ ]   GI: Vomiting[ ] ; Dysphagia[ ] ; Melena[ ] ; Hematochezia [ ] ; Heartburn[ ] ; Abdominal pain [ ] ; Constipation [ ] ; Diarrhea [ ] ; BRBPR [ ]   GU: Hematuria[ ] ; Dysuria [ ] ; Nocturia[ ]   Vascular: Pain in legs with walking [ ] ; Pain in feet with lying flat [ ] ; Non-healing sores [ ] ; Stroke [ ] ; TIA [ ] ; Slurred speech [ ] ;  Neuro: Headaches[ ] ; Vertigo[ ] ; Seizures[ ] ; Paresthesias[ ] ;Blurred vision [ ] ; Diplopia [ ] ; Vision changes [ ]   Ortho/Skin: Arthritis [ ] ; Joint pain [ ] ; Muscle pain [ ] ; Joint swelling [ ] ; Back Pain [ ] ; Rash [ ]   Psych: Depression[ ] ; Anxiety[ ]   Heme: Bleeding problems [ ] ; Clotting disorders [ ] ; Anemia [ ]   Endocrine: Diabetes [ ] ; Thyroid  dysfunction[ ]    Past Medical History:  Diagnosis Date   Arthritis    some in my back (04/14/2017)   CAD (coronary artery disease)    a.  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     Aflibercept  (EYLEA ) 2 MG/0.05ML SOLN by Intravitreal route See admin instructions. Every other month     allopurinol  (ZYLOPRIM ) 100 MG tablet Take 100 mg by mouth at bedtime.     apixaban  (ELIQUIS ) 5 MG TABS tablet Take 1 tablet (5 mg total) by mouth 2 (two) times daily. 180 tablet 1   azelastine  (ASTELIN ) 0.1 % nasal spray Place 1 spray into both nostrils 2 (two) times daily as needed for rhinitis or allergies. Use in each nostril as directed     clopidogrel  (PLAVIX ) 75 MG tablet TAKE 1 TABLET AT BEDTIME 90 tablet 3   empagliflozin  (JARDIANCE ) 10 MG TABS tablet Take 1 tablet (10 mg total) by mouth daily before breakfast. 90 tablet 3   Evolocumab  (REPATHA  SURECLICK) 140 MG/ML SOAJ Inject 140 mg into the skin every 14 (fourteen) days. 6 mL 3   fexofenadine (ALLEGRA) 180 MG tablet Take 180 mg by mouth daily.     hydrALAZINE  (APRESOLINE ) 50 MG tablet Take 1 tablet (50 mg total) by mouth 3 (three) times daily. 270 tablet 3   isosorbide  mononitrate (IMDUR ) 120 MG 24 hr tablet TAKE 1 TABLET (120 MG TOTAL) BY MOUTH DAILY. 90 tablet 3   ketoconazole (NIZORAL) 2 % cream Apply 1 application  topically daily as needed (itching toes, nose and eye brow).     metoprolol  succinate (TOPROL -XL) 25 MG 24 hr tablet Take 1 tablet (25 mg total) by mouth daily. Take with or immediately following a meal. 90 tablet 3   nitroGLYCERIN  (NITROSTAT ) 0.4 MG SL tablet Place 1 tablet (0.4 mg total) under the tongue every 5 (five) minutes as needed for chest pain. 75 tablet 1   ranolazine  (RANEXA ) 500 MG 12 hr tablet Take 1 tablet (500 mg total) by mouth 2 (two) times daily. 180 tablet 1   tamsulosin  (FLOMAX ) 0.4 MG CAPS Take 0.4 mg by mouth daily.     torsemide  (DEMADEX ) 20 MG tablet Take 3 tablets (60 mg total) by mouth daily. 270 tablet 3   No current facility-administered medications for this encounter.     Allergies[1]    Social History   Socioeconomic History   Marital status: Married    Spouse name: Not on file   Number of children: Not on file   Years of education: Not on file   Highest education level: Not on file  Occupational History   Not on file  Tobacco Use   Smoking status: Never    Passive exposure: Yes   Smokeless tobacco: Never   Tobacco comments:  Never smoke 10/21/21  Vaping Use   Vaping status: Never Used  Substance and Sexual Activity   Alcohol  use: Yes    Alcohol /week: 3.0 standard drinks of alcohol     Types: 1 Glasses of wine, 1 Cans of beer, 1 Standard drinks or equivalent per week    Comment: 2-3 drinks per week 10/21/21   Drug use: No   Sexual activity: Not Currently  Other Topics Concern   Not on file  Social History Narrative   Not on file   Social Drivers of Health   Tobacco Use: Medium Risk (05/17/2024)   Patient History    Smoking Tobacco Use: Never    Smokeless Tobacco Use: Never    Passive Exposure: Yes  Financial Resource Strain: Not on file  Food Insecurity: No Food Insecurity (05/07/2024)   Epic    Worried About Programme Researcher, Broadcasting/film/video in the Last Year: Never true    [employer] of Food in the Last Year: Never true  Transportation Needs: No Transportation Needs (05/07/2024)   Epic    Lack of Transportation (Medical): No    Lack of Transportation (Non-Medical): No  Physical Activity: Not on file  Stress: Not on file  Social Connections: Socially Isolated (05/07/2024)   Social Connection and Isolation Panel    Frequency of Communication with Friends and Family: Three times a week    Frequency of Social Gatherings with Friends and Family: Once a week    Attends Religious Services: Never    Database Administrator or Organizations: No    Attends Banker Meetings: Never    Marital Status: Divorced  Catering Manager Violence: Not At Risk (05/07/2024)   Epic    Fear of Current or Ex-Partner: No    Emotionally Abused: No     Physically Abused: No    Sexually Abused: No  Depression (PHQ2-9): Medium Risk (03/27/2022)   Depression (PHQ2-9)    PHQ-2 Score: 10  Alcohol  Screen: Not on file  Housing: Low Risk (05/07/2024)   Epic    Unable to Pay for Housing in the Last Year: No    Number of Times Moved in the Last Year: 0    Homeless in the Last Year: No  Utilities: Not At Risk (05/07/2024)   Epic    Threatened with loss of utilities: No  Health Literacy: Not on file      Family History  Problem Relation Age of Onset   Heart attack Father        32s   Heart disease Father        Heart Disease before age 86   Cancer Mother     Vitals:   05/26/24 0929  BP: (!) 158/82  Pulse: 68  SpO2: 97%  Weight: 74.8 kg (164 lb 12.8 oz)   ReDs 35%, upper limits of normla   PHYSICAL EXAM: General:  Well appearing. No respiratory difficulty HEENT: normal Neck: supple. no JVD. Carotids 2+ bilat; no bruits. No lymphadenopathy or thyromegaly appreciated. Cor: PMI nondisplaced. Regular rate & rhythm. No rubs, gallops or murmurs. Lungs: clear Abdomen: soft, nontender, nondistended. No hepatosplenomegaly. No bruits or masses. Good bowel sounds. Extremities: no cyanosis, clubbing, rash, edema Neuro: alert & oriented x 3, cranial nerves grossly intact. moves all 4 extremities w/o difficulty. Affect pleasant.  ECG: not performed    ASSESSMENT & PLAN:  1. CAD: S/p CABG followed by redo CABG.  Coronary angiography 1/9 with no target for PCI.  Patent LIMA-LAD and SVG-PDA.  -  continues w/ chronic anginal attacks, relieved w/ SL NTG  - Continue Plavix . No ASA given need for apixaban   - Statin intolerant, takes Repatha .  - Continue ranolazine  500 bid.  - Continue Imdur  120 mg daily - Add back amlodipine  5 mg daily - keep f/u w/ gen cards in 4 wks  2. Chronic systolic CHF: Ischemic cardiomyopathy.  Has CRT-P device.  Echo in 12/25 EF 30-35% with moderate RV dysfunction and probably moderate aortic stenosis, IVC dilated.   RHC 1/26 with elevated right and left heart filling pressures, CI 2 Fick/1.6 thermo. Patient was admitted for diuresis and started on dobutamine  5. He diuresed well, weaned off of DBA and transitioned to PO torsemide . Functionally improved, now NYHA Class II. Grossly euvolemic on exam. ReDs upper limits of normal at 35% - Continue on 80 mg of torsemide  daily for now. Check Pro BNP and BMP  - GDMT will be limited by CKD stage IV.   - Continue hydralazine  50 mg tid and continue Imdur  120 mg daily    - Continue Jardiance  10 daily - Continue Toprol  XL 25 mg daily (reduced dose d/t recent low output)   - Add amlodipine  5 mg daily for further afterload reduction and antianginal benefit - He is not a candidate for advanced therapies with age and CKD stage IV.  3. Atrial fibrillation: Paroxysmal.  A-paced currently. No longer on amiodarone  d/t pulmonary toxicity. RRR on exam  - Continue apixaban .  4. CKD stage IV: Most recent SCr ~3.0  - check BMP today   - continue SGLT2i, Jardiance  10 mg   - followed by [facility]. Has appt in 2 wks  5. Aortic stenosis: Suspect no more than moderate based on recent echo and cath.  - follow echo annually  6. Carotid stenosis: H/o CEA. - continue Plavix  and Repatha    7. H/o complete heart block: Has CRT-P device. Followed by Dr. Kennyth   Keep f/u w/ cardiology in 4 wks. F/u w/ Dr. Zenaida in 8 wks   Caffie Shed, PA-C 05/26/24      [1]  Allergies Allergen Reactions   Amiodarone  Other (See Comments)    Lung toxicity    "

## 2024-05-26 ENCOUNTER — Encounter (HOSPITAL_COMMUNITY): Payer: Self-pay

## 2024-05-26 ENCOUNTER — Ambulatory Visit (HOSPITAL_COMMUNITY): Payer: Self-pay | Admitting: Cardiology

## 2024-05-26 ENCOUNTER — Ambulatory Visit (HOSPITAL_COMMUNITY)
Admission: RE | Admit: 2024-05-26 | Discharge: 2024-05-26 | Disposition: A | Discharge status: Home / Self Care | Attending: Physician Assistant | Admitting: Physician Assistant

## 2024-05-26 VITALS — BP 158/82 | HR 68 | Wt 164.8 lb

## 2024-05-26 DIAGNOSIS — I6529 Occlusion and stenosis of unspecified carotid artery: Secondary | ICD-10-CM | POA: Diagnosis not present

## 2024-05-26 DIAGNOSIS — N184 Chronic kidney disease, stage 4 (severe): Secondary | ICD-10-CM | POA: Diagnosis not present

## 2024-05-26 DIAGNOSIS — I255 Ischemic cardiomyopathy: Secondary | ICD-10-CM | POA: Insufficient documentation

## 2024-05-26 DIAGNOSIS — I350 Nonrheumatic aortic (valve) stenosis: Secondary | ICD-10-CM | POA: Insufficient documentation

## 2024-05-26 DIAGNOSIS — I10 Essential (primary) hypertension: Secondary | ICD-10-CM

## 2024-05-26 DIAGNOSIS — I480 Paroxysmal atrial fibrillation: Secondary | ICD-10-CM | POA: Insufficient documentation

## 2024-05-26 DIAGNOSIS — Z7902 Long term (current) use of antithrombotics/antiplatelets: Secondary | ICD-10-CM | POA: Insufficient documentation

## 2024-05-26 DIAGNOSIS — Z950 Presence of cardiac pacemaker: Secondary | ICD-10-CM | POA: Insufficient documentation

## 2024-05-26 DIAGNOSIS — I5020 Unspecified systolic (congestive) heart failure: Secondary | ICD-10-CM

## 2024-05-26 DIAGNOSIS — Z79899 Other long term (current) drug therapy: Secondary | ICD-10-CM | POA: Insufficient documentation

## 2024-05-26 DIAGNOSIS — Z7901 Long term (current) use of anticoagulants: Secondary | ICD-10-CM | POA: Insufficient documentation

## 2024-05-26 DIAGNOSIS — Z8249 Family history of ischemic heart disease and other diseases of the circulatory system: Secondary | ICD-10-CM | POA: Diagnosis not present

## 2024-05-26 DIAGNOSIS — I130 Hypertensive heart and chronic kidney disease with heart failure and stage 1 through stage 4 chronic kidney disease, or unspecified chronic kidney disease: Secondary | ICD-10-CM | POA: Diagnosis not present

## 2024-05-26 DIAGNOSIS — I25118 Atherosclerotic heart disease of native coronary artery with other forms of angina pectoris: Secondary | ICD-10-CM

## 2024-05-26 DIAGNOSIS — I5022 Chronic systolic (congestive) heart failure: Secondary | ICD-10-CM | POA: Insufficient documentation

## 2024-05-26 DIAGNOSIS — I25119 Atherosclerotic heart disease of native coronary artery with unspecified angina pectoris: Secondary | ICD-10-CM | POA: Insufficient documentation

## 2024-05-26 DIAGNOSIS — Z951 Presence of aortocoronary bypass graft: Secondary | ICD-10-CM | POA: Insufficient documentation

## 2024-05-26 LAB — PRO BRAIN NATRIURETIC PEPTIDE: Pro Brain Natriuretic Peptide: 6400 pg/mL — ABNORMAL HIGH

## 2024-05-26 LAB — BASIC METABOLIC PANEL WITH GFR
Anion gap: 11 (ref 5–15)
BUN: 49 mg/dL — ABNORMAL HIGH (ref 8–23)
CO2: 28 mmol/L (ref 22–32)
Calcium: 9.6 mg/dL (ref 8.9–10.3)
Chloride: 103 mmol/L (ref 98–111)
Creatinine, Ser: 2.89 mg/dL — ABNORMAL HIGH (ref 0.61–1.24)
GFR, Estimated: 21 mL/min — ABNORMAL LOW
Glucose, Bld: 111 mg/dL — ABNORMAL HIGH (ref 70–99)
Potassium: 4.8 mmol/L (ref 3.5–5.1)
Sodium: 141 mmol/L (ref 135–145)

## 2024-05-26 MED ORDER — AMLODIPINE BESYLATE 5 MG PO TABS: 5.0000 mg | ORAL_TABLET | Freq: Every day | ORAL | 3 refills | Status: AC

## 2024-05-26 MED ORDER — TORSEMIDE 20 MG PO TABS: 80.0000 mg | ORAL_TABLET | Freq: Every day | ORAL | Status: AC

## 2024-05-26 NOTE — Telephone Encounter (Signed)
 In accordance with refill protocols, please review and address the following requirements before this medication refill can be authorized:  Labs

## 2024-05-26 NOTE — Progress Notes (Signed)
"   ReDS Vest / Clip - 05/26/24 0929       ReDS Vest / Clip   Station Marker C    Ruler Value 32    ReDS Value Range Low volume    ReDS Actual Value 35          "

## 2024-05-26 NOTE — Patient Instructions (Signed)
 START Amlodipine  5 mg daily   Labs done today, your results will be available in MyChart, we will contact you for abnormal readings.  Your physician recommends that you schedule a follow-up appointment in: 3 months.  If you have any questions or concerns before your next appointment please send us  a message through [place] or call our office at [phone].    TO LEAVE A MESSAGE FOR THE NURSE SELECT OPTION 2, PLEASE LEAVE A MESSAGE INCLUDING: YOUR NAME DATE OF BIRTH CALL BACK NUMBER REASON FOR CALL**this is important as we prioritize the call backs  YOU WILL RECEIVE A CALL BACK THE SAME DAY AS LONG AS YOU CALL BEFORE 4:00 PM  At the [facility], you and your health needs are our priority. As part of our continuing mission to provide you with exceptional heart care, we have created designated Provider Care Teams. These Care Teams include your primary Cardiologist (physician) and Advanced Practice Providers (APPs- Physician Assistants and Nurse Practitioners) who all work together to provide you with the care you need, when you need it.   You may see any of the following providers on your designated Care Team at your next follow up: Dr Toribio Fuel Dr Ezra Shuck Dr. Morene Brownie Greig Mosses, NP Caffie Shed, [place] Det[place], [place] Beckey Coe, NP Jordan Lee, NP Ellouise Class, NP Tinnie Redman, PharmD Jaun Bash, PharmD   Please be sure to bring in all your medications bottles to every appointment.    Thank you for choosing [facility]

## 2024-05-28 ENCOUNTER — Encounter (HOSPITAL_COMMUNITY): Payer: Self-pay

## 2024-06-01 ENCOUNTER — Encounter: Payer: Self-pay | Admitting: Cardiology

## 2024-06-03 ENCOUNTER — Encounter: Payer: Self-pay | Admitting: Cardiovascular Disease

## 2024-06-21 ENCOUNTER — Ambulatory Visit

## 2024-06-24 ENCOUNTER — Ambulatory Visit: Admitting: Physician Assistant

## 2024-08-15 ENCOUNTER — Ambulatory Visit (HOSPITAL_COMMUNITY): Admitting: Cardiology

## 2024-08-16 ENCOUNTER — Ambulatory Visit: Admitting: Pulmonary Disease

## 2024-09-20 ENCOUNTER — Ambulatory Visit

## 2024-12-20 ENCOUNTER — Ambulatory Visit
